# Patient Record
Sex: Male | Born: 1935
Health system: Southern US, Community
[De-identification: ages and names within clinical notes are randomized; demographics above are authoritative.]

## PROBLEM LIST (undated history)

## (undated) DIAGNOSIS — I509 Heart failure, unspecified: Secondary | ICD-10-CM

## (undated) DIAGNOSIS — I10 Essential (primary) hypertension: Secondary | ICD-10-CM

## (undated) DIAGNOSIS — J449 Chronic obstructive pulmonary disease, unspecified: Secondary | ICD-10-CM

## (undated) DIAGNOSIS — N2889 Other specified disorders of kidney and ureter: Secondary | ICD-10-CM

## (undated) DIAGNOSIS — N189 Chronic kidney disease, unspecified: Secondary | ICD-10-CM

## (undated) DIAGNOSIS — I2510 Atherosclerotic heart disease of native coronary artery without angina pectoris: Secondary | ICD-10-CM

## (undated) DIAGNOSIS — I219 Acute myocardial infarction, unspecified: Secondary | ICD-10-CM

## (undated) HISTORY — DX: Chronic obstructive pulmonary disease, unspecified: J44.9

## (undated) HISTORY — PX: CORONARY ARTERY BYPASS GRAFT: SHX141

## (undated) HISTORY — PX: HERNIA REPAIR: SHX51

## (undated) HISTORY — DX: Heart failure, unspecified: I50.9

---

## 1994-12-07 HISTORY — PX: CARDIAC CATHETERIZATION: SHX172

## 1998-09-04 ENCOUNTER — Encounter: Payer: Self-pay | Admitting: Surgery

## 1998-09-06 ENCOUNTER — Ambulatory Visit (HOSPITAL_COMMUNITY): Admission: RE | Admit: 1998-09-06 | Discharge: 1998-09-06 | Payer: Self-pay | Admitting: Surgery

## 1998-12-08 ENCOUNTER — Emergency Department (HOSPITAL_COMMUNITY): Admission: EM | Admit: 1998-12-08 | Discharge: 1998-12-08 | Payer: Self-pay | Admitting: Emergency Medicine

## 2001-08-19 ENCOUNTER — Encounter: Admission: RE | Admit: 2001-08-19 | Discharge: 2001-08-19 | Payer: Self-pay | Admitting: Nephrology

## 2001-08-19 ENCOUNTER — Encounter: Payer: Self-pay | Admitting: Nephrology

## 2003-09-29 ENCOUNTER — Emergency Department (HOSPITAL_COMMUNITY): Admission: EM | Admit: 2003-09-29 | Discharge: 2003-09-29 | Payer: Self-pay | Admitting: Emergency Medicine

## 2005-04-20 ENCOUNTER — Emergency Department (HOSPITAL_COMMUNITY): Admission: EM | Admit: 2005-04-20 | Discharge: 2005-04-20 | Payer: Self-pay | Admitting: Emergency Medicine

## 2006-12-24 ENCOUNTER — Emergency Department (HOSPITAL_COMMUNITY): Admission: EM | Admit: 2006-12-24 | Discharge: 2006-12-24 | Payer: Self-pay | Admitting: Emergency Medicine

## 2007-12-19 ENCOUNTER — Encounter: Admission: RE | Admit: 2007-12-19 | Discharge: 2008-01-09 | Payer: Self-pay | Admitting: Endocrinology

## 2008-02-27 ENCOUNTER — Emergency Department (HOSPITAL_COMMUNITY): Admission: EM | Admit: 2008-02-27 | Discharge: 2008-02-27 | Payer: Self-pay | Admitting: Emergency Medicine

## 2008-02-27 ENCOUNTER — Ambulatory Visit (HOSPITAL_COMMUNITY): Admission: RE | Admit: 2008-02-27 | Discharge: 2008-02-27 | Payer: Self-pay | Admitting: *Deleted

## 2008-02-27 ENCOUNTER — Encounter (INDEPENDENT_AMBULATORY_CARE_PROVIDER_SITE_OTHER): Payer: Self-pay | Admitting: *Deleted

## 2008-06-27 ENCOUNTER — Emergency Department (HOSPITAL_COMMUNITY): Admission: EM | Admit: 2008-06-27 | Discharge: 2008-06-27 | Payer: Self-pay | Admitting: Emergency Medicine

## 2008-06-28 ENCOUNTER — Ambulatory Visit: Payer: Self-pay | Admitting: Cardiovascular Disease

## 2008-06-28 ENCOUNTER — Inpatient Hospital Stay (HOSPITAL_COMMUNITY): Admission: EM | Admit: 2008-06-28 | Discharge: 2008-07-04 | Payer: Self-pay | Admitting: Emergency Medicine

## 2008-06-29 ENCOUNTER — Encounter (INDEPENDENT_AMBULATORY_CARE_PROVIDER_SITE_OTHER): Payer: Self-pay | Admitting: Internal Medicine

## 2009-12-07 HISTORY — PX: DOPPLER ECHOCARDIOGRAPHY: SHX263

## 2010-06-27 ENCOUNTER — Ambulatory Visit: Payer: Self-pay | Admitting: Cardiology

## 2010-06-27 ENCOUNTER — Inpatient Hospital Stay (HOSPITAL_COMMUNITY): Admission: EM | Admit: 2010-06-27 | Discharge: 2010-07-01 | Payer: Self-pay | Admitting: Emergency Medicine

## 2010-06-27 ENCOUNTER — Encounter (INDEPENDENT_AMBULATORY_CARE_PROVIDER_SITE_OTHER): Payer: Self-pay | Admitting: Internal Medicine

## 2010-09-29 ENCOUNTER — Ambulatory Visit (HOSPITAL_COMMUNITY): Admission: RE | Admit: 2010-09-29 | Discharge: 2010-09-29 | Payer: Self-pay | Admitting: Endocrinology

## 2011-02-21 LAB — HEMOGLOBIN A1C
Hgb A1c MFr Bld: 8.4 % — ABNORMAL HIGH
Mean Plasma Glucose: 194 mg/dL — ABNORMAL HIGH

## 2011-02-21 LAB — CBC
HCT: 44.8 % (ref 39.0–52.0)
HCT: 45.5 % (ref 39.0–52.0)
HCT: 51.1 % (ref 39.0–52.0)
HCT: 54.1 % — ABNORMAL HIGH (ref 39.0–52.0)
Hemoglobin: 15.2 g/dL (ref 13.0–17.0)
MCH: 28.8 pg (ref 26.0–34.0)
MCH: 29.3 pg (ref 26.0–34.0)
MCH: 29.4 pg (ref 26.0–34.0)
MCHC: 32.8 g/dL (ref 30.0–36.0)
MCHC: 33.2 g/dL (ref 30.0–36.0)
MCHC: 33.5 g/dL (ref 30.0–36.0)
MCV: 87.8 fL (ref 78.0–100.0)
MCV: 87.8 fL (ref 78.0–100.0)
MCV: 87.9 fL (ref 78.0–100.0)
MCV: 88.1 fL (ref 78.0–100.0)
Platelets: 173 10*3/uL (ref 150–400)
Platelets: 187 10*3/uL (ref 150–400)
Platelets: 208 10*3/uL (ref 150–400)
RBC: 5.16 MIL/uL (ref 4.22–5.81)
RDW: 13.9 % (ref 11.5–15.5)
RDW: 14 % (ref 11.5–15.5)
RDW: 14.5 % (ref 11.5–15.5)
WBC: 7.1 10*3/uL (ref 4.0–10.5)

## 2011-02-21 LAB — URINE MICROSCOPIC-ADD ON

## 2011-02-21 LAB — BASIC METABOLIC PANEL
BUN: 15 mg/dL (ref 6–23)
BUN: 30 mg/dL — ABNORMAL HIGH (ref 6–23)
CO2: 27 mEq/L (ref 19–32)
Chloride: 109 mEq/L (ref 96–112)
Creatinine, Ser: 1.78 mg/dL — ABNORMAL HIGH (ref 0.4–1.5)
GFR calc Af Amer: 54 mL/min — ABNORMAL LOW (ref >60)
GFR calc non Af Amer: 37 mL/min — ABNORMAL LOW (ref >60)
GFR calc non Af Amer: 52 mL/min — ABNORMAL LOW (ref >60)
Glucose, Bld: 217 mg/dL — ABNORMAL HIGH (ref 70–99)
Potassium: 3.9 mEq/L (ref 3.5–5.1)
Potassium: 4.2 mEq/L (ref 3.5–5.1)
Sodium: 142 mEq/L (ref 135–145)
Sodium: 145 mEq/L (ref 135–145)

## 2011-02-21 LAB — GLUCOSE, CAPILLARY
Glucose-Capillary: 109 mg/dL — ABNORMAL HIGH (ref 70–99)
Glucose-Capillary: 136 mg/dL — ABNORMAL HIGH (ref 70–99)
Glucose-Capillary: 145 mg/dL — ABNORMAL HIGH (ref 70–99)
Glucose-Capillary: 152 mg/dL — ABNORMAL HIGH (ref 70–99)
Glucose-Capillary: 185 mg/dL — ABNORMAL HIGH (ref 70–99)
Glucose-Capillary: 195 mg/dL — ABNORMAL HIGH (ref 70–99)
Glucose-Capillary: 229 mg/dL — ABNORMAL HIGH (ref 70–99)
Glucose-Capillary: 252 mg/dL — ABNORMAL HIGH (ref 70–99)
Glucose-Capillary: 92 mg/dL (ref 70–99)

## 2011-02-21 LAB — COMPREHENSIVE METABOLIC PANEL WITH GFR
ALT: 11 U/L (ref 0–53)
AST: 20 U/L (ref 0–37)
Albumin: 3.7 g/dL (ref 3.5–5.2)
Alkaline Phosphatase: 73 U/L (ref 39–117)
BUN: 25 mg/dL — ABNORMAL HIGH (ref 6–23)
CO2: 28 meq/L (ref 19–32)
Calcium: 9 mg/dL (ref 8.4–10.5)
Chloride: 102 meq/L (ref 96–112)
Creatinine, Ser: 1.94 mg/dL — ABNORMAL HIGH (ref 0.4–1.5)
GFR calc non Af Amer: 34 mL/min — ABNORMAL LOW
Glucose, Bld: 191 mg/dL — ABNORMAL HIGH (ref 70–99)
Potassium: 4.3 meq/L (ref 3.5–5.1)
Sodium: 137 meq/L (ref 135–145)
Total Bilirubin: 1.2 mg/dL (ref 0.3–1.2)
Total Protein: 7.2 g/dL (ref 6.0–8.3)

## 2011-02-21 LAB — TSH: TSH: 3.807 u[IU]/mL (ref 0.350–4.500)

## 2011-02-21 LAB — DIFFERENTIAL
Basophils Absolute: 0 10*3/uL (ref 0.0–0.1)
Basophils Relative: 0 % (ref 0–1)
Eosinophils Absolute: 0.1 10*3/uL (ref 0.0–0.7)
Eosinophils Relative: 1 % (ref 0–5)
Lymphocytes Relative: 7 % — ABNORMAL LOW (ref 12–46)
Lymphs Abs: 0.9 10*3/uL (ref 0.7–4.0)
Monocytes Absolute: 0.8 10*3/uL (ref 0.1–1.0)
Monocytes Relative: 6 % (ref 3–12)
Neutro Abs: 12.3 10*3/uL — ABNORMAL HIGH (ref 1.7–7.7)
Neutrophils Relative %: 87 % — ABNORMAL HIGH (ref 43–77)

## 2011-02-21 LAB — COMPREHENSIVE METABOLIC PANEL
Albumin: 3.5 g/dL (ref 3.5–5.2)
Alkaline Phosphatase: 69 U/L (ref 39–117)
BUN: 30 mg/dL — ABNORMAL HIGH (ref 6–23)
Calcium: 8.9 mg/dL (ref 8.4–10.5)
Creatinine, Ser: 2.11 mg/dL — ABNORMAL HIGH (ref 0.4–1.5)
Glucose, Bld: 249 mg/dL — ABNORMAL HIGH (ref 70–99)
Total Protein: 6.9 g/dL (ref 6.0–8.3)

## 2011-02-21 LAB — PROTIME-INR
INR: 1.1 (ref 0.00–1.49)
Prothrombin Time: 14.1 s (ref 11.6–15.2)

## 2011-02-21 LAB — URINE CULTURE: Culture: NO GROWTH

## 2011-02-21 LAB — POCT CARDIAC MARKERS
CKMB, poc: 6.9 ng/mL (ref 1.0–8.0)
Myoglobin, poc: 281 ng/mL (ref 12–200)
Troponin i, poc: <0.05 ng/mL (ref 0.00–0.09)

## 2011-02-21 LAB — CK TOTAL AND CKMB (NOT AT ARMC): Relative Index: 3.6 — ABNORMAL HIGH (ref 0.0–2.5)

## 2011-02-21 LAB — URINALYSIS, ROUTINE W REFLEX MICROSCOPIC
Hgb urine dipstick: NEGATIVE
Ketones, ur: 15 mg/dL — AB
Nitrite: NEGATIVE
pH: 5 (ref 5.0–8.0)

## 2011-02-21 LAB — CULTURE, BLOOD (ROUTINE X 2): Culture: NO GROWTH

## 2011-02-21 LAB — LIPID PANEL
Cholesterol: 130 mg/dL (ref 0–200)
LDL Cholesterol: 65 mg/dL (ref 0–99)
Triglycerides: 107 mg/dL (ref <150)
VLDL: 21 mg/dL (ref 0–40)

## 2011-02-21 LAB — LIPASE, BLOOD: Lipase: 19 U/L (ref 11–59)

## 2011-04-21 NOTE — Op Note (Signed)
NAMECLARION, TRASK NO.:  [id number]   MEDICAL RECORD NO.:  [id number]          PATIENT TYPE:  AMB   LOCATION:  ENDO                         FACILITY:  [facility]   PHYSICIAN:  Waverly Ferrari, M.D.    DATE OF BIRTH:  [birth year]   DATE OF PROCEDURE:  02/27/2008  DATE OF DISCHARGE:                               OPERATIVE REPORT   PROCEDURE:  Colonoscopy.   INDICATIONS:  Colon polyp, cancer screening   ANESTHESIA:  Fentanyl 125 mcg, Versed 10 mg and epinephrine 3 mL.   PROCEDURE:  With the patient mildly sedated in the left lateral  decubitus position, rectal exam was attempted.  I could not feel the  prostate.  Subsequently the Pentax videoscopic colonoscope was inserted  in the rectum, passed under direct vision with pressure applied.  The  patient rolled to his back and to his right side; subsequently back to  his left side.  With pressure applied, we were able to reach the cecum  identified by ileocecal valve and base of cecum.  In the base of the  cecum was a polyp seen photographed and removed using snare cautery  technique setting of 20/150 blended current.  There was some bleeding  from this polyp so I elected to inject epinephrine 2 mL into this area.  From this point the colonoscope was then slowly withdrawn taking  circumferential views of colonic mucosa stopping in the ascending colon  where a second polyp was seen and it was removed also using snare  cautery technique with the same setting and this tissue was suctioned  into the endoscope as well and retrieved for pathology placed in size  specimen bottle number one.  We then withdrew taking circumferential  views remaining colonic mucosa stopping then only in the transverse  colon where two polyps were seen. The first was removed using snare  cautery technique.  The second with hot biopsy forceps technique.  With  the former there was some bleeding again and 1 mL of epinephrine was  injected into this  polyp site with good hemostasis.  The endoscope was  withdrawn all the way to the rectum which appeared normal on direct and  showed hemorrhoids on retroflexed view.  The endoscope was straightened  and withdrawn.  The patient's vital signs, pulse oximeter remained  stable.  The patient tolerated procedure well without apparent  complications.   FINDINGS:  Diverticulosis of sigmoid colon, polyps of cecum and  ascending colon and also transverse colon, placed into separate  containers.  Internal hemorrhoids were noted.   PLAN:  Await biopsy report.  The patient will call me for results and  follow-up with me as an outpatient.           ______________________________  Waverly Ferrari, M.D.     GMO/MEDQ  D:  02/27/2008  T:  02/27/2008  Job:  [id number]

## 2011-04-21 NOTE — H&P (Signed)
NAMEJOURDYN, [name]                 ACCOUNT NO.:  [id number]   MEDICAL RECORD NO.:  [id number]          PATIENT TYPE:  INP   LOCATION:  [id number]                         FACILITY:  [facility]   PHYSICIAN:  Durwin Nora, MDDATE OF BIRTH:  11-22-1932   DATE OF ADMISSION:  06/28/2008  DATE OF DISCHARGE:                              HISTORY & PHYSICAL   PRIMARY CARE PHYSICIAN:  Ronaldo Miyamoto, M.D.   She has no assigned health care power of attorney.  He is a full code.   PRESENTING COMPLAINT:  Fever, 2 days.   HISTORY OF PRESENTING COMPLAINTS:  This is a 77 year old male who  earlier presented to the emergency room on June 27, 2008, with a high-  grade fever, temperature 102.  The patient was sent home on Tamiflu.  However, the blood culture taken in the emergency room showed it was  positive for Gram-negative rods and the patient was recalled.  The  patient has repeat temperature today of 104, and the patient has been  having dry cough and poor oral intake.  He also has right upper  abdominal pain.  He denies any vomiting with malaise.  No nausea.  No  hematemesis or melena stool.  The patient denies any wheezing or  shortness of breath.  He denies any dysuria, hematuria, or frequency.  He denies any body swelling or skin rash.  The lab test at the emergency  room showed increased renal insufficiency, elevated LFTs, and  leukocytosis.  Chest x-ray show some questionable congestion versus  pneumonia basally.   PAST SURGICAL HISTORY:  1. Coronary artery bypass, 10 years ago.  2. Left inguinal herniorrhaphy.   SOCIAL HISTORY:  Married, had 7 children.  He quit smoking cigarettes  and drinking alcohol excessively more than 10 years ago.   FAMILY HISTORY:  Mother had diabetes, hypertension, and  hypercholesterolemia.   REVIEW OF SYSTEMS:  The 14-systems were reviewed.  Pertinent positives  are in the history of presenting complaint.   ALLERGIES:  No known drug allergies.   MEDICATIONS AT HOME:  1. Actos 30 mg daily.  2. Insulin NPH.  3. Lasix 40 mg daily.  4. Lotrel 5/40 one tablet daily.  5. Tricor 45 mg daily.  6. Clonidine 0.2 mg daily.  7. Nadolol 40 mg daily.  8. Crestor 20 mg daily.  9. Tamiflu 75 mg b.i.d.   PHYSICAL EXAMINATION:  GENERAL:  On examination, this is an elderly man.  He is tachypneic.  VITAL SIGNS:  Temperature is 104, pulse 82, respiratory rate is 32, and  blood pressure 150/67.  HEENT:  Pupils are equal, reactive to light, and accommodation.  Mucous  membranes are dry.  Head is atraumatic and normocephalic.  Oropharynx  and nasopharynx are clear.  There is no submandibular lymphadenopathy.  No axillary lymphadenopathy.  JVD is not elevated.  HEART:  Heart sounds 1 and 2 regular with bibasilar fine rales.  No  rhonchi.  ABDOMEN:  He has mild epigastric and right upper quadrant tenderness.  No organomegaly.  He has truncal obesity.  There is an old CABG  sternal  chest wall scar.  Inguinal orifices are patent.  NEUROLOGIC:  He is alert and oriented in time, place, and person.  Cranial nerves II-XII are intact.  Power is 4+ globally.  He has trace  pedal edema.  Peripheral pulses are present.   LABORATORY DATA:  WBCs 13.3, hematocrit 44, and platelet count 163.  INR  is 1.2.  Chemistry shows a sodium of 140, potassium 4.5, chloride 110,  bicarbonate 22, glucose 132, BUN 24, creatinine 2.7, and ALT is 184.  Urinalysis shows small leukocyte esterase.  WBC 326.  Chest x-ray shows  some questionable congestive heart failure, question of pericardial  effusion, bibasilar atelectasis versus infiltrates.  EKG is pending.   ASSESSMENT:  Gram-negative sepsis, rule out underlying flu.  Urinary  tract infection.  Questionable pneumonia versus CHF.  Elevated  transaminases.  Acute renal failure versus acute-on-chronic renal  insufficiency.  Diabetes is fairly controlled.  Moderate hypertension.   The patient will be admitted to a Telemetry  bed.  He is to be on bed  rest with proper precautions.  Activity will be bed rest.  Oxygen 2-5 L  per minute to keep saturations greater than 90.  Diet will be renal and  low cholesterol.  IV fluids, normal saline at 50 mL an hour, watching  for fluid overload.  Cardiac enzymes q.8 h. x3 and EKG q.8 h. x3.  We  will get a nasal swab H1N1 x2, renal ultrasound, 2-D echocardiogram, and  ABG on room air.  Obtain a blood culture and urine culture.  He is to be  on heparin 5000 units subcu q.8 h. for DVT prophylaxis, Phenergan 12.5  mg IV alternating with Zofran 2 mg IV p.r.n. for nausea.  Protonix 40 mg  IV daily.  DuoNeb one unit dose q.6 h. p.r.n.  Accu-Chek a.c. and  bedtime. NovoLog sliding scale insulin coverage via renal scale, and  Tylenol 650 mg q.4 h. p.r.n.  Start him on IV Rocephin 1 g daily and  azithromycin 500 mg IV daily.  Continue with home medication except for  his TriCor and Crestor because of the elevated LFT.  We will hold Lasix.  Continue the Tamiflu 75 mg b.i.d.  Check a CBC, CMP, and chest x-ray in  the morning.  Consider an ID evaluation.      Durwin Nora, MD  Electronically Signed     MIO/MEDQ  D:  06/28/2008  T:  06/29/2008  Job:  [id number]   cc:   Ronaldo Miyamoto, M.D.

## 2011-04-21 NOTE — Discharge Summary (Signed)
NAME:  MAE, BEAUCHAMP NO.:  [id number]   MEDICAL RECORD NO.:  [id number]          PATIENT TYPE:  INP   LOCATION:  [id number]                         FACILITY:  [facility]   PHYSICIAN:  Rise Patience, MDDATE OF BIRTH:  1932-05-06   DATE OF ADMISSION:  06/28/2008  DATE OF DISCHARGE:  07/04/2008                               DISCHARGE SUMMARY   HOSPITAL COURSE:  A 77 year old male with known history of CAD, status  post CABG, chronic kidney disease, hypertension, and diabetes mellitus  type 2 was called into the hospital after the patient had blood cultures  done prior to admission, which grew Gram-negative rods.  The patient had  originally presented to ER with fever when he was discharged home with  Tamiflu.  Blood cultures obtained at that time showed Gram-negative  rods.  The patient was called back and admitted for further management  of his Gram-negative bacteremia.  In addition, the patient also was  found to be febrile.  The patient was started on empiric antibiotics.  Eventually, blood cultures showed E. Coli, which was sensitive to  ceftriaxone.  The patient also was found to have a high creatinine level  of 2.7.  The patient's ACE inhibitors were stopped, hydration was given,  during which time, the patient also had a CAT scan of the abdomen and  pelvis, which did not show any acute findings.  The patient's condition  gradually improved.  The patient also had a H1N1 swab done, results of  which still are pending.  The patient also had hypoglycemic event  wherein his NPH dose had to be adjusted, presently his blood sugars are  ranging around 100.  The patient is more alert, awake, and oriented and  wants to go home.  The patient had a repeat blood culture, which did not  show any growth.  At this time, as the patient is eager to go home, he  will be discharged home.  I advised the patient that any pending  cultures to be followed with his primary care physician  Dr. Wilson Singer.  Repeat a CMET within a week's time.   FINAL DIAGNOSES:  1. Escherichia coli bacteremia.  2. Acute-on-chronic kidney disease.  3. Diabetes mellitus type 2.  4. Abnormal liver function tests and thrombocytopenia, just resolved,      probably from sepsis.  5. Hypertension.  6. History of coronary artery disease.   DISCHARGE MEDICATIONS:  1. Actos 30 mg p.o. daily.  2. TriCor 45 mg p.o. daily.  3. Clonidine 0.1 mg p.o. b.i.d.  4. Nadolol 40 mg p.o. daily.  5. Crestor 20 mg p.o. daily.  6. Omnicef 300 mg p.o. b.i.d. for 10 days.  7. Tamiflu 75 mg p.o. b.i.d. for 5 days.  8. Norvasc 5 mg daily.  9. NPH insulin 20 units subcutaneous in the a.m. and 10 units      subcutaneous at p.m.  10.Aspirin 81 mg p.o. daily.   PLAN:  The patient is advised to follow with his primary care physician  within a week's time who will recheck his complete metabolic panel.  The  patient's ACE inhibitors and Lasix are on hold.  Once the patient  follows with his primary care physician, at that time, to consider  restarting if appropriate.  The patient advised to be at home until he  completes a course of Tamiflu.  The patient is advised to complete his  full dose of antibiotics.  The patient is to be on hypoglycemic  precautions and to check his blood sugars a.c. and nightly to be on  cardiac-healthy and carb-modified diet and to be on fall precautions.      Rise Patience, MD  Electronically Signed     ANK/MEDQ  D:  07/04/2008  T:  07/05/2008  Job:  [id number]

## 2011-09-04 LAB — CBC
HCT: 38.8 — ABNORMAL LOW
HCT: 44.6
Hemoglobin: 12.3 — ABNORMAL LOW
Hemoglobin: 13.2
Hemoglobin: 14.8
MCHC: 32.9
MCHC: 33.1
MCHC: 33.3
MCV: 85.2
MCV: 85.8
MCV: 86.1
RBC: 4.34
RBC: 4.52
RBC: 4.64
RBC: 5.21
RBC: 5.23
WBC: 11.5 — ABNORMAL HIGH
WBC: 11.5 — ABNORMAL HIGH
WBC: 13.3 — ABNORMAL HIGH
WBC: 7.5
WBC: 8.5

## 2011-09-04 LAB — COMPREHENSIVE METABOLIC PANEL
ALT: 43
ALT: 80 — ABNORMAL HIGH
AST: 34
AST: 91 — ABNORMAL HIGH
Albumin: 3.7
Alkaline Phosphatase: 111
BUN: 18
BUN: 24 — ABNORMAL HIGH
BUN: 25 — ABNORMAL HIGH
CO2: 22
CO2: 22
CO2: 22
CO2: 25
Calcium: 8.3 — ABNORMAL LOW
Calcium: 8.5
Calcium: 8.5
Calcium: 8.9
Chloride: 107
Chloride: 108
Chloride: 108
Chloride: 110
Creatinine, Ser: 1.86 — ABNORMAL HIGH
Creatinine, Ser: 1.9 — ABNORMAL HIGH
Creatinine, Ser: 2.56 — ABNORMAL HIGH
Creatinine, Ser: 2.7 — ABNORMAL HIGH
GFR calc Af Amer: 30 — ABNORMAL LOW
GFR calc Af Amer: 42 — ABNORMAL LOW
GFR calc Af Amer: 43 — ABNORMAL LOW
GFR calc Af Amer: 49 — ABNORMAL LOW
GFR calc non Af Amer: 23 — ABNORMAL LOW
GFR calc non Af Amer: 25 — ABNORMAL LOW
GFR calc non Af Amer: 35 — ABNORMAL LOW
GFR calc non Af Amer: 41 — ABNORMAL LOW
Glucose, Bld: 148 — ABNORMAL HIGH
Glucose, Bld: 169 — ABNORMAL HIGH
Potassium: 3.5
Potassium: 3.9
Sodium: 140
Sodium: 141
Total Bilirubin: 1.1
Total Bilirubin: 1.5 — ABNORMAL HIGH
Total Bilirubin: 1.7 — ABNORMAL HIGH
Total Bilirubin: 2.8 — ABNORMAL HIGH

## 2011-09-04 LAB — CARDIAC PANEL(CRET KIN+CKTOT+MB+TROPI)
CK, MB: 8.4 — ABNORMAL HIGH
Relative Index: 0.4
Total CK: 963 — ABNORMAL HIGH
Troponin I: 0.03

## 2011-09-04 LAB — URINALYSIS, ROUTINE W REFLEX MICROSCOPIC
Glucose, UA: NEGATIVE
Glucose, UA: NEGATIVE
Hgb urine dipstick: NEGATIVE
Ketones, ur: 15 — AB
Protein, ur: 100 — AB
Specific Gravity, Urine: 1.02
Urobilinogen, UA: 1
pH: 5

## 2011-09-04 LAB — BASIC METABOLIC PANEL
BUN: 17
CO2: 23
Calcium: 8.6
Creatinine, Ser: 1.68 — ABNORMAL HIGH
Creatinine, Ser: 1.69 — ABNORMAL HIGH
GFR calc Af Amer: 48 — ABNORMAL LOW
GFR calc Af Amer: 49 — ABNORMAL LOW
GFR calc non Af Amer: 40 — ABNORMAL LOW
GFR calc non Af Amer: 40 — ABNORMAL LOW
Potassium: 4.2
Sodium: 141

## 2011-09-04 LAB — CULTURE, BLOOD (ROUTINE X 2): Culture: NO GROWTH

## 2011-09-04 LAB — DIFFERENTIAL
Basophils Absolute: 0
Basophils Absolute: 0
Lymphocytes Relative: 4 — ABNORMAL LOW
Lymphocytes Relative: 5 — ABNORMAL LOW
Lymphs Abs: 0.5 — ABNORMAL LOW
Monocytes Absolute: 0.4
Neutro Abs: 10.5 — ABNORMAL HIGH
Neutro Abs: 12.3 — ABNORMAL HIGH

## 2011-09-04 LAB — URINE MICROSCOPIC-ADD ON

## 2011-09-04 LAB — BLOOD GAS, ARTERIAL
Acid-base deficit: 2
O2 Content: 2
O2 Saturation: 90
Patient temperature: 98.6

## 2011-09-04 LAB — URINE CULTURE

## 2011-09-04 LAB — HEMOGLOBIN A1C
Hgb A1c MFr Bld: 7 — ABNORMAL HIGH
Hgb A1c MFr Bld: 7.5 — ABNORMAL HIGH
Mean Plasma Glucose: 172

## 2011-09-04 LAB — H1N1 SCREEN (PCR)

## 2011-09-04 LAB — POCT CARDIAC MARKERS: Operator id: 288831

## 2011-09-04 LAB — LIPASE, BLOOD: Lipase: 19

## 2011-09-04 LAB — B-NATRIURETIC PEPTIDE (CONVERTED LAB): Pro B Natriuretic peptide (BNP): 945 — ABNORMAL HIGH

## 2011-12-31 DIAGNOSIS — E789 Disorder of lipoprotein metabolism, unspecified: Secondary | ICD-10-CM | POA: Diagnosis not present

## 2012-01-07 DIAGNOSIS — I10 Essential (primary) hypertension: Secondary | ICD-10-CM | POA: Diagnosis not present

## 2012-01-07 DIAGNOSIS — E291 Testicular hypofunction: Secondary | ICD-10-CM | POA: Diagnosis not present

## 2012-01-07 DIAGNOSIS — E789 Disorder of lipoprotein metabolism, unspecified: Secondary | ICD-10-CM | POA: Diagnosis not present

## 2012-01-14 ENCOUNTER — Inpatient Hospital Stay (HOSPITAL_COMMUNITY)
Admission: EM | Admit: 2012-01-14 | Discharge: 2012-01-16 | DRG: 639 | Disposition: A | Discharge status: Home / Self Care | Payer: Medicare Other | Source: Ambulatory Visit | Attending: Internal Medicine | Admitting: Internal Medicine

## 2012-01-14 ENCOUNTER — Other Ambulatory Visit: Payer: Self-pay

## 2012-01-14 ENCOUNTER — Emergency Department (HOSPITAL_COMMUNITY): Payer: Medicare Other

## 2012-01-14 ENCOUNTER — Encounter (HOSPITAL_COMMUNITY): Payer: Self-pay | Admitting: Internal Medicine

## 2012-01-14 DIAGNOSIS — I2510 Atherosclerotic heart disease of native coronary artery without angina pectoris: Secondary | ICD-10-CM | POA: Diagnosis present

## 2012-01-14 DIAGNOSIS — E785 Hyperlipidemia, unspecified: Secondary | ICD-10-CM | POA: Diagnosis present

## 2012-01-14 DIAGNOSIS — E162 Hypoglycemia, unspecified: Secondary | ICD-10-CM | POA: Diagnosis not present

## 2012-01-14 DIAGNOSIS — R55 Syncope and collapse: Secondary | ICD-10-CM | POA: Diagnosis not present

## 2012-01-14 DIAGNOSIS — N189 Chronic kidney disease, unspecified: Secondary | ICD-10-CM | POA: Diagnosis not present

## 2012-01-14 DIAGNOSIS — N183 Chronic kidney disease, stage 3 unspecified: Secondary | ICD-10-CM | POA: Diagnosis present

## 2012-01-14 DIAGNOSIS — Z79899 Other long term (current) drug therapy: Secondary | ICD-10-CM

## 2012-01-14 DIAGNOSIS — E1169 Type 2 diabetes mellitus with other specified complication: Principal | ICD-10-CM | POA: Diagnosis present

## 2012-01-14 DIAGNOSIS — I10 Essential (primary) hypertension: Secondary | ICD-10-CM | POA: Diagnosis present

## 2012-01-14 DIAGNOSIS — Z043 Encounter for examination and observation following other accident: Secondary | ICD-10-CM | POA: Diagnosis not present

## 2012-01-14 DIAGNOSIS — Z87891 Personal history of nicotine dependence: Secondary | ICD-10-CM

## 2012-01-14 DIAGNOSIS — I517 Cardiomegaly: Secondary | ICD-10-CM | POA: Diagnosis not present

## 2012-01-14 DIAGNOSIS — R7301 Impaired fasting glucose: Secondary | ICD-10-CM | POA: Diagnosis not present

## 2012-01-14 DIAGNOSIS — E161 Other hypoglycemia: Secondary | ICD-10-CM | POA: Diagnosis not present

## 2012-01-14 DIAGNOSIS — I129 Hypertensive chronic kidney disease with stage 1 through stage 4 chronic kidney disease, or unspecified chronic kidney disease: Secondary | ICD-10-CM | POA: Diagnosis present

## 2012-01-14 DIAGNOSIS — Z951 Presence of aortocoronary bypass graft: Secondary | ICD-10-CM

## 2012-01-14 DIAGNOSIS — D72829 Elevated white blood cell count, unspecified: Secondary | ICD-10-CM | POA: Diagnosis present

## 2012-01-14 DIAGNOSIS — Z794 Long term (current) use of insulin: Secondary | ICD-10-CM

## 2012-01-14 HISTORY — DX: Essential (primary) hypertension: I10

## 2012-01-14 HISTORY — DX: Chronic kidney disease, unspecified: N18.9

## 2012-01-14 HISTORY — DX: Atherosclerotic heart disease of native coronary artery without angina pectoris: I25.10

## 2012-01-14 LAB — GLUCOSE, CAPILLARY
Glucose-Capillary: 48 mg/dL — ABNORMAL LOW (ref 70–99)
Glucose-Capillary: 262 mg/dL — ABNORMAL HIGH (ref 70–99)

## 2012-01-14 LAB — COMPREHENSIVE METABOLIC PANEL
ALT: 10 U/L (ref 0–53)
AST: 24 U/L (ref 0–37)
Alkaline Phosphatase: 104 U/L (ref 39–117)
CO2: 26 mEq/L (ref 19–32)
Calcium: 9.5 mg/dL (ref 8.4–10.5)
Chloride: 103 mEq/L (ref 96–112)
GFR calc Af Amer: 43 mL/min — ABNORMAL LOW (ref >90)
GFR calc non Af Amer: 37 mL/min — ABNORMAL LOW (ref >90)
Glucose, Bld: 70 mg/dL (ref 70–99)
Sodium: 139 mEq/L (ref 135–145)
Total Bilirubin: 0.5 mg/dL (ref 0.3–1.2)

## 2012-01-14 LAB — CARDIAC PANEL(CRET KIN+CKTOT+MB+TROPI): Troponin I: <0.3 ng/mL (ref <0.30)

## 2012-01-14 LAB — POCT I-STAT, CHEM 8
BUN: 21 mg/dL (ref 6–23)
Chloride: 108 mEq/L (ref 96–112)
Creatinine, Ser: 1.8 mg/dL — ABNORMAL HIGH (ref 0.50–1.35)
Glucose, Bld: 71 mg/dL (ref 70–99)
Hemoglobin: 17.3 g/dL — ABNORMAL HIGH (ref 13.0–17.0)
Potassium: 3.6 mEq/L (ref 3.5–5.1)
Sodium: 143 mEq/L (ref 135–145)

## 2012-01-14 LAB — URINALYSIS, ROUTINE W REFLEX MICROSCOPIC
Bilirubin Urine: NEGATIVE
Glucose, UA: NEGATIVE mg/dL
Specific Gravity, Urine: 1.008 (ref 1.005–1.030)
Urobilinogen, UA: 0.2 mg/dL (ref 0.0–1.0)
pH: 7 (ref 5.0–8.0)

## 2012-01-14 LAB — DIFFERENTIAL
Basophils Absolute: 0 10*3/uL (ref 0.0–0.1)
Eosinophils Relative: 1 % (ref 0–5)
Lymphocytes Relative: 7 % — ABNORMAL LOW (ref 12–46)
Lymphs Abs: 1.3 10*3/uL (ref 0.7–4.0)
Neutro Abs: 16.1 10*3/uL — ABNORMAL HIGH (ref 1.7–7.7)

## 2012-01-14 LAB — POCT I-STAT TROPONIN I

## 2012-01-14 LAB — CBC
HCT: 47.2 % (ref 39.0–52.0)
MCV: 80 fL (ref 78.0–100.0)
Platelets: 204 10*3/uL (ref 150–400)
RBC: 5.9 MIL/uL — ABNORMAL HIGH (ref 4.22–5.81)
WBC: 19 10*3/uL — ABNORMAL HIGH (ref 4.0–10.5)

## 2012-01-14 LAB — URINE MICROSCOPIC-ADD ON

## 2012-01-14 MED ORDER — SODIUM CHLORIDE 0.9 % IV SOLN: INTRAVENOUS | Status: DC

## 2012-01-14 MED ORDER — BENAZEPRIL HCL 40 MG PO TABS
40.0000 mg | ORAL_TABLET | Freq: Every day | ORAL | Status: DC
  Administered 2012-01-15 – 2012-01-16 (×2): 40 mg via ORAL
  Filled 2012-01-14 (×2): qty 1

## 2012-01-14 MED ORDER — AMLODIPINE BESY-BENAZEPRIL HCL 10-40 MG PO CAPS: 1.0000 | ORAL_CAPSULE | Freq: Every day | ORAL | Status: DC

## 2012-01-14 MED ORDER — FENOFIBRATE 54 MG PO TABS
54.0000 mg | ORAL_TABLET | Freq: Every day | ORAL | Status: DC
  Administered 2012-01-15 – 2012-01-16 (×2): 54 mg via ORAL
  Filled 2012-01-14 (×2): qty 1

## 2012-01-14 MED ORDER — FUROSEMIDE 80 MG PO TABS
80.0000 mg | ORAL_TABLET | Freq: Every day | ORAL | Status: DC
  Administered 2012-01-15 – 2012-01-16 (×2): 80 mg via ORAL
  Filled 2012-01-14 (×2): qty 1

## 2012-01-14 MED ORDER — ACETAMINOPHEN 650 MG RE SUPP: 650.0000 mg | Freq: Four times a day (QID) | RECTAL | Status: DC | PRN

## 2012-01-14 MED ORDER — AMLODIPINE BESYLATE 10 MG PO TABS
10.0000 mg | ORAL_TABLET | Freq: Every day | ORAL | Status: DC
  Administered 2012-01-15 – 2012-01-16 (×2): 10 mg via ORAL
  Filled 2012-01-14 (×2): qty 1

## 2012-01-14 MED ORDER — ACETAMINOPHEN 325 MG PO TABS: 650.0000 mg | ORAL_TABLET | Freq: Four times a day (QID) | ORAL | Status: DC | PRN

## 2012-01-14 MED ORDER — DEXTROSE 50 % IV SOLN
INTRAVENOUS | Status: AC
  Administered 2012-01-14: 18:00:00
  Filled 2012-01-14: qty 50

## 2012-01-14 MED ORDER — EZETIMIBE 10 MG PO TABS
10.0000 mg | ORAL_TABLET | Freq: Every day | ORAL | Status: DC
  Administered 2012-01-15 – 2012-01-16 (×2): 10 mg via ORAL
  Filled 2012-01-14 (×2): qty 1

## 2012-01-14 MED ORDER — ROSUVASTATIN CALCIUM 20 MG PO TABS
20.0000 mg | ORAL_TABLET | Freq: Every day | ORAL | Status: DC
  Administered 2012-01-15 – 2012-01-16 (×2): 20 mg via ORAL
  Filled 2012-01-14 (×2): qty 1

## 2012-01-14 MED ORDER — CLONIDINE HCL 0.1 MG PO TABS
0.1000 mg | ORAL_TABLET | Freq: Two times a day (BID) | ORAL | Status: DC
  Administered 2012-01-14 – 2012-01-16 (×4): 0.1 mg via ORAL
  Filled 2012-01-14 (×5): qty 1

## 2012-01-14 MED ORDER — SODIUM CHLORIDE 0.9 % IJ SOLN
3.0000 mL | Freq: Two times a day (BID) | INTRAMUSCULAR | Status: DC
  Administered 2012-01-14 – 2012-01-16 (×3): 3 mL via INTRAVENOUS

## 2012-01-14 MED ORDER — GLUCOSE 40 % PO GEL
ORAL | Status: AC
  Administered 2012-01-14: 18:00:00
  Filled 2012-01-14: qty 1

## 2012-01-14 MED ORDER — NADOLOL 40 MG PO TABS
40.0000 mg | ORAL_TABLET | Freq: Every day | ORAL | Status: DC
  Administered 2012-01-16: 40 mg via ORAL
  Filled 2012-01-14 (×2): qty 1

## 2012-01-14 MED ORDER — ONDANSETRON HCL 4 MG PO TABS: 4.0000 mg | ORAL_TABLET | Freq: Four times a day (QID) | ORAL | Status: DC | PRN

## 2012-01-14 MED ORDER — ONDANSETRON HCL 4 MG/2ML IJ SOLN: 4.0000 mg | Freq: Four times a day (QID) | INTRAMUSCULAR | Status: DC | PRN

## 2012-01-14 MED ORDER — SODIUM CHLORIDE 0.9 % IV BOLUS (SEPSIS)
1000.0000 mL | Freq: Once | INTRAVENOUS | Status: AC
  Administered 2012-01-14: 1000 mL via INTRAVENOUS
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
## 2012-01-14 NOTE — ED Notes (Signed)
Pt returned from CT/ xray at this time.  VS stable.  BP: 161/76; RR 16; HR 57; Temp 97.6 oral; oxygen sat 96% room air.  Pt continue to deny pain.  Resps even/unlabored.  Skin warm/ dry and color WNL.  Alert/ oriented x 4.

## 2012-01-14 NOTE — ED Notes (Signed)
BP 150/78; RR 15; HR 56; pulse ox 96% room air

## 2012-01-14 NOTE — ED Notes (Signed)
BP 159/80; ox sat 100% 2l/[place]; RR 16; HR 55.  Family remains at bedside.  Pt states he feels his breathing has improved since application of oxygen and administration of D50.  Alert/ oriented x 4.  Skin warm/ dry and color WNL for race.
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
## 2012-01-14 NOTE — ED Notes (Signed)
Pt given turkey sandwich and orange juice

## 2012-01-14 NOTE — ED Notes (Signed)
The pt is  Sitting with his family no complaints.  Waiting for admission orders

## 2012-01-14 NOTE — ED Notes (Signed)
Liza, NT notified me that pt reports difficulty breathing.  Upon checking with pt, he states "I just can't get it right" when asked if he was having trouble breathing.  Pt taking regular breaths with periods of swallowing while inhaling or exhaling.  Dr. Dillard Essex notified and at bedside.  Oxygen 2L/min via [place] applied and CBG checked: 48 mg/dl.  Verbal order received for amp of D50.

## 2012-01-14 NOTE — ED Notes (Signed)
The pt is eating a sandwich.  He is alert and is sayint that he has not eaten all day since breakfast.  Family at the bedside
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
## 2012-01-14 NOTE — ED Notes (Signed)
Admitting doctor here to see 

## 2012-01-14 NOTE — ED Notes (Signed)
BP 157/77; HR 55; pulse ox 96% room air; RR 15; denies pain.

## 2012-01-14 NOTE — ED Notes (Signed)
Pt transported to x-ray/ CT.  Vital signs stable.  BP 148/77, RR 14, HR 57, Pulse ox 95% room air.

## 2012-01-15 DIAGNOSIS — Z87891 Personal history of nicotine dependence: Secondary | ICD-10-CM | POA: Diagnosis not present

## 2012-01-15 DIAGNOSIS — N189 Chronic kidney disease, unspecified: Secondary | ICD-10-CM | POA: Diagnosis present

## 2012-01-15 DIAGNOSIS — R55 Syncope and collapse: Secondary | ICD-10-CM | POA: Diagnosis present

## 2012-01-15 DIAGNOSIS — Z794 Long term (current) use of insulin: Secondary | ICD-10-CM | POA: Diagnosis not present

## 2012-01-15 DIAGNOSIS — D72829 Elevated white blood cell count, unspecified: Secondary | ICD-10-CM | POA: Diagnosis present

## 2012-01-15 DIAGNOSIS — Z79899 Other long term (current) drug therapy: Secondary | ICD-10-CM | POA: Diagnosis not present

## 2012-01-15 DIAGNOSIS — E1169 Type 2 diabetes mellitus with other specified complication: Secondary | ICD-10-CM | POA: Diagnosis present

## 2012-01-15 DIAGNOSIS — I2510 Atherosclerotic heart disease of native coronary artery without angina pectoris: Secondary | ICD-10-CM | POA: Diagnosis present

## 2012-01-15 DIAGNOSIS — E785 Hyperlipidemia, unspecified: Secondary | ICD-10-CM | POA: Diagnosis present

## 2012-01-15 DIAGNOSIS — Z951 Presence of aortocoronary bypass graft: Secondary | ICD-10-CM | POA: Diagnosis not present

## 2012-01-15 DIAGNOSIS — I129 Hypertensive chronic kidney disease with stage 1 through stage 4 chronic kidney disease, or unspecified chronic kidney disease: Secondary | ICD-10-CM | POA: Diagnosis present

## 2012-01-15 LAB — CARDIAC PANEL(CRET KIN+CKTOT+MB+TROPI)
CK, MB: 6.2 ng/mL (ref 0.3–4.0)
CK, MB: 4.9 ng/mL — ABNORMAL HIGH (ref 0.3–4.0)
Relative Index: 1.5 (ref 0.0–2.5)
Relative Index: 1.1 (ref 0.0–2.5)
Troponin I: <0.3 ng/mL (ref <0.30)

## 2012-01-15 LAB — GLUCOSE, CAPILLARY
Glucose-Capillary: 312 mg/dL — ABNORMAL HIGH (ref 70–99)
Glucose-Capillary: 317 mg/dL — ABNORMAL HIGH (ref 70–99)
Glucose-Capillary: 286 mg/dL — ABNORMAL HIGH (ref 70–99)

## 2012-01-15 LAB — CBC
HCT: 42.9 % (ref 39.0–52.0)
Platelets: 180 10*3/uL (ref 150–400)
RDW: 14.3 % (ref 11.5–15.5)
WBC: 9.1 10*3/uL (ref 4.0–10.5)

## 2012-01-15 LAB — COMPREHENSIVE METABOLIC PANEL
Alkaline Phosphatase: 88 U/L (ref 39–117)
BUN: 18 mg/dL (ref 6–23)
Creatinine, Ser: 1.68 mg/dL — ABNORMAL HIGH (ref 0.50–1.35)
GFR calc Af Amer: 44 mL/min — ABNORMAL LOW (ref >90)
Glucose, Bld: 264 mg/dL — ABNORMAL HIGH (ref 70–99)
Potassium: 3.9 mEq/L (ref 3.5–5.1)
Total Bilirubin: 0.5 mg/dL (ref 0.3–1.2)
Total Protein: 6.5 g/dL (ref 6.0–8.3)

## 2012-01-15 LAB — HEMOGLOBIN A1C: Mean Plasma Glucose: 169 mg/dL — ABNORMAL HIGH (ref <117)

## 2012-01-15 MED ORDER — INSULIN ASPART 100 UNIT/ML ~~LOC~~ SOLN
0.0000 [IU] | Freq: Three times a day (TID) | SUBCUTANEOUS | Status: DC
  Administered 2012-01-15 – 2012-01-16 (×3): 5 [IU] via SUBCUTANEOUS
  Filled 2012-01-15: qty 3

## 2012-01-15 MED ORDER — INSULIN LISPRO PROT & LISPRO (50-50 MIX) 100 UNIT/ML ~~LOC~~ SUSP
80.0000 [IU] | Freq: Two times a day (BID) | SUBCUTANEOUS | Status: DC
  Administered 2012-01-16: 80 [IU] via SUBCUTANEOUS
  Filled 2012-01-15: qty 10

## 2012-01-15 NOTE — Plan of Care (Signed)
Problem: Phase I Progression Outcomes Goal: Initial discharge plan identified Outcome: Completed/Met Date Met:  01/15/12 Plan to discharge back to home with wife

## 2012-01-15 NOTE — Progress Notes (Signed)
Pts HR SB in the high 50s all this AM; pt to receive 40mg  nadolol PO, MD made aware and was told to hold today's dose

## 2012-01-15 NOTE — Progress Notes (Signed)
CBG cont. To rise now 49 TC MD no new orders at this time will wait to AM and re-evaluate pt. Status.
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
## 2012-01-15 NOTE — Progress Notes (Signed)
Inpatient Diabetes Program Recommendations  AACE/ADA: New Consensus Statement on Inpatient Glycemic Control (2009)  Target Ranges:  Prepandial:   less than 140 mg/dL      Peak postprandial:   less than 180 mg/dL (1-2 hours)      Critically ill patients:  140 - 180 mg/dL   Reason for Visit: Results for [name], [name] (MRN [id number]) as of 01/15/2012 13:41  Ref. Range 01/15/2012 01:12 01/15/2012 03:58 01/15/2012 07:41 01/15/2012 09:59 01/15/2012 12:16  Glucose-Capillary Latest Range: 70-99 mg/dL 287 (H) 312 (H) 236 (H) 317 (H) 271 (H)   Note patient admitted with low BG's.  Now CBG's greater than 250 mg/dL.  According to medication reconciliation, patient was on 50/50 insulin 80 units bid. Consider adding 1/2 of basal insulin portion of 50/50.  Inpatient Diabetes Program Recommendations Insulin - Basal: Consider adding NPH 20 units bid. HgbA1C: to determine prehospitalization glycemic control.  Note: Will follow.

## 2012-01-16 LAB — GLUCOSE, CAPILLARY
Glucose-Capillary: 258 mg/dL — ABNORMAL HIGH (ref 70–99)
Glucose-Capillary: 102 mg/dL — ABNORMAL HIGH (ref 70–99)
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
## 2012-01-26 DIAGNOSIS — E0789 Other specified disorders of thyroid: Secondary | ICD-10-CM | POA: Diagnosis not present

## 2012-01-26 DIAGNOSIS — I10 Essential (primary) hypertension: Secondary | ICD-10-CM | POA: Diagnosis not present

## 2012-01-26 DIAGNOSIS — E119 Type 2 diabetes mellitus without complications: Secondary | ICD-10-CM | POA: Diagnosis not present

## 2012-01-26 DIAGNOSIS — E789 Disorder of lipoprotein metabolism, unspecified: Secondary | ICD-10-CM | POA: Diagnosis not present

## 2012-01-26 DIAGNOSIS — Z125 Encounter for screening for malignant neoplasm of prostate: Secondary | ICD-10-CM | POA: Diagnosis not present

## 2012-02-02 DIAGNOSIS — E291 Testicular hypofunction: Secondary | ICD-10-CM | POA: Diagnosis not present

## 2012-02-02 DIAGNOSIS — E789 Disorder of lipoprotein metabolism, unspecified: Secondary | ICD-10-CM | POA: Diagnosis not present

## 2012-02-02 DIAGNOSIS — E119 Type 2 diabetes mellitus without complications: Secondary | ICD-10-CM | POA: Diagnosis not present

## 2012-02-02 DIAGNOSIS — I10 Essential (primary) hypertension: Secondary | ICD-10-CM | POA: Diagnosis not present

## 2012-02-10 DIAGNOSIS — I2510 Atherosclerotic heart disease of native coronary artery without angina pectoris: Secondary | ICD-10-CM | POA: Diagnosis not present

## 2012-02-10 DIAGNOSIS — E119 Type 2 diabetes mellitus without complications: Secondary | ICD-10-CM | POA: Diagnosis not present

## 2012-02-10 DIAGNOSIS — I10 Essential (primary) hypertension: Secondary | ICD-10-CM | POA: Diagnosis not present

## 2012-02-10 DIAGNOSIS — E789 Disorder of lipoprotein metabolism, unspecified: Secondary | ICD-10-CM | POA: Diagnosis not present

## 2012-02-16 DIAGNOSIS — Z9861 Coronary angioplasty status: Secondary | ICD-10-CM | POA: Diagnosis not present

## 2012-02-16 DIAGNOSIS — I509 Heart failure, unspecified: Secondary | ICD-10-CM | POA: Diagnosis not present

## 2012-02-16 DIAGNOSIS — I10 Essential (primary) hypertension: Secondary | ICD-10-CM | POA: Diagnosis not present

## 2012-02-16 DIAGNOSIS — I130 Hypertensive heart and chronic kidney disease with heart failure and stage 1 through stage 4 chronic kidney disease, or unspecified chronic kidney disease: Secondary | ICD-10-CM | POA: Diagnosis not present

## 2012-03-22 DIAGNOSIS — R7301 Impaired fasting glucose: Secondary | ICD-10-CM | POA: Diagnosis not present

## 2012-03-22 DIAGNOSIS — R4182 Altered mental status, unspecified: Secondary | ICD-10-CM | POA: Diagnosis not present

## 2012-04-14 DIAGNOSIS — R7301 Impaired fasting glucose: Secondary | ICD-10-CM | POA: Diagnosis not present

## 2012-04-14 DIAGNOSIS — R404 Transient alteration of awareness: Secondary | ICD-10-CM | POA: Diagnosis not present

## 2012-04-20 DIAGNOSIS — E119 Type 2 diabetes mellitus without complications: Secondary | ICD-10-CM | POA: Diagnosis not present

## 2012-04-21 DIAGNOSIS — E119 Type 2 diabetes mellitus without complications: Secondary | ICD-10-CM | POA: Diagnosis not present

## 2012-04-21 DIAGNOSIS — E789 Disorder of lipoprotein metabolism, unspecified: Secondary | ICD-10-CM | POA: Diagnosis not present

## 2012-04-21 DIAGNOSIS — E162 Hypoglycemia, unspecified: Secondary | ICD-10-CM | POA: Diagnosis not present

## 2012-04-21 DIAGNOSIS — I10 Essential (primary) hypertension: Secondary | ICD-10-CM | POA: Diagnosis not present

## 2012-06-17 DIAGNOSIS — R972 Elevated prostate specific antigen [PSA]: Secondary | ICD-10-CM | POA: Diagnosis not present

## 2012-06-27 DIAGNOSIS — E119 Type 2 diabetes mellitus without complications: Secondary | ICD-10-CM | POA: Diagnosis not present

## 2012-06-27 DIAGNOSIS — I10 Essential (primary) hypertension: Secondary | ICD-10-CM | POA: Diagnosis not present

## 2012-06-27 DIAGNOSIS — E789 Disorder of lipoprotein metabolism, unspecified: Secondary | ICD-10-CM | POA: Diagnosis not present

## 2012-06-27 DIAGNOSIS — E291 Testicular hypofunction: Secondary | ICD-10-CM | POA: Diagnosis not present

## 2012-07-08 ENCOUNTER — Encounter (HOSPITAL_COMMUNITY): Payer: Self-pay | Admitting: Emergency Medicine

## 2012-07-08 ENCOUNTER — Emergency Department (HOSPITAL_COMMUNITY)
Admission: EM | Admit: 2012-07-08 | Discharge: 2012-07-08 | Disposition: A | Discharge status: Home / Self Care | Payer: Medicare Other | Attending: Emergency Medicine | Admitting: Emergency Medicine

## 2012-07-08 DIAGNOSIS — R61 Generalized hyperhidrosis: Secondary | ICD-10-CM | POA: Diagnosis not present

## 2012-07-08 DIAGNOSIS — E1169 Type 2 diabetes mellitus with other specified complication: Secondary | ICD-10-CM | POA: Insufficient documentation

## 2012-07-08 DIAGNOSIS — T148XXA Other injury of unspecified body region, initial encounter: Secondary | ICD-10-CM | POA: Diagnosis not present

## 2012-07-08 DIAGNOSIS — I2510 Atherosclerotic heart disease of native coronary artery without angina pectoris: Secondary | ICD-10-CM | POA: Insufficient documentation

## 2012-07-08 DIAGNOSIS — N189 Chronic kidney disease, unspecified: Secondary | ICD-10-CM | POA: Diagnosis not present

## 2012-07-08 DIAGNOSIS — Z951 Presence of aortocoronary bypass graft: Secondary | ICD-10-CM | POA: Insufficient documentation

## 2012-07-08 DIAGNOSIS — R55 Syncope and collapse: Secondary | ICD-10-CM | POA: Diagnosis not present

## 2012-07-08 DIAGNOSIS — I129 Hypertensive chronic kidney disease with stage 1 through stage 4 chronic kidney disease, or unspecified chronic kidney disease: Secondary | ICD-10-CM | POA: Insufficient documentation

## 2012-07-08 DIAGNOSIS — Z794 Long term (current) use of insulin: Secondary | ICD-10-CM | POA: Insufficient documentation

## 2012-07-08 DIAGNOSIS — R7301 Impaired fasting glucose: Secondary | ICD-10-CM | POA: Diagnosis not present

## 2012-07-08 DIAGNOSIS — E162 Hypoglycemia, unspecified: Secondary | ICD-10-CM

## 2012-07-08 LAB — POCT I-STAT, CHEM 8
Creatinine, Ser: 1.9 mg/dL — ABNORMAL HIGH (ref 0.50–1.35)
Glucose, Bld: 112 mg/dL — ABNORMAL HIGH (ref 70–99)
HCT: 49 % (ref 39.0–52.0)
Hemoglobin: 16.7 g/dL (ref 13.0–17.0)
Potassium: 3.9 mEq/L (ref 3.5–5.1)
Sodium: 142 mEq/L (ref 135–145)
TCO2: 20 mmol/L (ref 0–100)

## 2012-07-08 MED ORDER — DEXTROSE 50 % IV SOLN
INTRAVENOUS | Status: AC
  Administered 2012-07-08: 16:00:00
  Filled 2012-07-08: qty 50

## 2012-07-08 NOTE — ED Notes (Signed)
CBG 89 Rn notified Creg

## 2012-07-08 NOTE — ED Notes (Signed)
CBG 202 Rn notified creg

## 2012-07-08 NOTE — ED Notes (Signed)
Gave patient peanut butter crackers, cheese, orange juice, apple sauce, and [place] sandwich.
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
## 2012-09-23 DIAGNOSIS — E161 Other hypoglycemia: Secondary | ICD-10-CM | POA: Diagnosis not present

## 2012-09-23 DIAGNOSIS — R7301 Impaired fasting glucose: Secondary | ICD-10-CM | POA: Diagnosis not present

## 2012-09-27 DIAGNOSIS — E789 Disorder of lipoprotein metabolism, unspecified: Secondary | ICD-10-CM | POA: Diagnosis not present

## 2012-10-04 DIAGNOSIS — E119 Type 2 diabetes mellitus without complications: Secondary | ICD-10-CM | POA: Diagnosis not present

## 2012-10-04 DIAGNOSIS — E291 Testicular hypofunction: Secondary | ICD-10-CM | POA: Diagnosis not present

## 2012-10-04 DIAGNOSIS — I10 Essential (primary) hypertension: Secondary | ICD-10-CM | POA: Diagnosis not present

## 2012-10-04 DIAGNOSIS — Z23 Encounter for immunization: Secondary | ICD-10-CM | POA: Diagnosis not present

## 2012-10-04 DIAGNOSIS — E789 Disorder of lipoprotein metabolism, unspecified: Secondary | ICD-10-CM | POA: Diagnosis not present

## 2013-01-11 DIAGNOSIS — I10 Essential (primary) hypertension: Secondary | ICD-10-CM | POA: Diagnosis not present

## 2013-01-11 DIAGNOSIS — I2510 Atherosclerotic heart disease of native coronary artery without angina pectoris: Secondary | ICD-10-CM | POA: Diagnosis not present

## 2013-01-11 DIAGNOSIS — E291 Testicular hypofunction: Secondary | ICD-10-CM | POA: Diagnosis not present

## 2013-01-11 DIAGNOSIS — E119 Type 2 diabetes mellitus without complications: Secondary | ICD-10-CM | POA: Diagnosis not present

## 2013-01-12 DIAGNOSIS — E109 Type 1 diabetes mellitus without complications: Secondary | ICD-10-CM | POA: Diagnosis not present

## 2013-01-12 DIAGNOSIS — H524 Presbyopia: Secondary | ICD-10-CM | POA: Diagnosis not present

## 2013-01-17 DIAGNOSIS — Z951 Presence of aortocoronary bypass graft: Secondary | ICD-10-CM | POA: Diagnosis not present

## 2013-01-17 DIAGNOSIS — I10 Essential (primary) hypertension: Secondary | ICD-10-CM | POA: Diagnosis not present

## 2013-01-17 DIAGNOSIS — E782 Mixed hyperlipidemia: Secondary | ICD-10-CM | POA: Diagnosis not present

## 2013-02-06 DIAGNOSIS — E789 Disorder of lipoprotein metabolism, unspecified: Secondary | ICD-10-CM | POA: Diagnosis not present

## 2013-02-18 ENCOUNTER — Encounter (HOSPITAL_COMMUNITY): Payer: Self-pay | Admitting: Emergency Medicine

## 2013-02-18 ENCOUNTER — Emergency Department (HOSPITAL_COMMUNITY)
Admission: EM | Admit: 2013-02-18 | Discharge: 2013-02-18 | Disposition: A | Discharge status: Home / Self Care | Payer: Medicare Other | Attending: Emergency Medicine | Admitting: Emergency Medicine

## 2013-02-18 DIAGNOSIS — N189 Chronic kidney disease, unspecified: Secondary | ICD-10-CM | POA: Diagnosis not present

## 2013-02-18 DIAGNOSIS — Z87891 Personal history of nicotine dependence: Secondary | ICD-10-CM | POA: Insufficient documentation

## 2013-02-18 DIAGNOSIS — G5630 Lesion of radial nerve, unspecified upper limb: Secondary | ICD-10-CM | POA: Insufficient documentation

## 2013-02-18 DIAGNOSIS — I10 Essential (primary) hypertension: Secondary | ICD-10-CM | POA: Insufficient documentation

## 2013-02-18 DIAGNOSIS — Z794 Long term (current) use of insulin: Secondary | ICD-10-CM | POA: Diagnosis not present

## 2013-02-18 DIAGNOSIS — Z951 Presence of aortocoronary bypass graft: Secondary | ICD-10-CM | POA: Diagnosis not present

## 2013-02-18 DIAGNOSIS — Z79899 Other long term (current) drug therapy: Secondary | ICD-10-CM | POA: Insufficient documentation

## 2013-02-18 DIAGNOSIS — I2510 Atherosclerotic heart disease of native coronary artery without angina pectoris: Secondary | ICD-10-CM | POA: Diagnosis not present

## 2013-02-18 DIAGNOSIS — E119 Type 2 diabetes mellitus without complications: Secondary | ICD-10-CM | POA: Diagnosis not present
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
## 2013-02-18 NOTE — ED Notes (Signed)
Pt states "EMS come out yesterday and my sugar was 27, they  Brought me back around but my left arm is not acting right"

## 2013-02-24 DIAGNOSIS — I10 Essential (primary) hypertension: Secondary | ICD-10-CM | POA: Diagnosis not present

## 2013-02-24 DIAGNOSIS — E119 Type 2 diabetes mellitus without complications: Secondary | ICD-10-CM | POA: Diagnosis not present

## 2013-02-24 DIAGNOSIS — M79609 Pain in unspecified limb: Secondary | ICD-10-CM | POA: Diagnosis not present

## 2013-02-28 DIAGNOSIS — E119 Type 2 diabetes mellitus without complications: Secondary | ICD-10-CM | POA: Diagnosis not present

## 2013-02-28 DIAGNOSIS — G589 Mononeuropathy, unspecified: Secondary | ICD-10-CM | POA: Diagnosis not present

## 2013-03-30 DIAGNOSIS — R7301 Impaired fasting glucose: Secondary | ICD-10-CM | POA: Diagnosis not present

## 2013-03-30 DIAGNOSIS — E161 Other hypoglycemia: Secondary | ICD-10-CM | POA: Diagnosis not present

## 2013-05-04 DIAGNOSIS — E119 Type 2 diabetes mellitus without complications: Secondary | ICD-10-CM | POA: Diagnosis not present

## 2013-05-04 DIAGNOSIS — E789 Disorder of lipoprotein metabolism, unspecified: Secondary | ICD-10-CM | POA: Diagnosis not present

## 2013-05-11 DIAGNOSIS — E119 Type 2 diabetes mellitus without complications: Secondary | ICD-10-CM | POA: Diagnosis not present

## 2013-05-11 DIAGNOSIS — E789 Disorder of lipoprotein metabolism, unspecified: Secondary | ICD-10-CM | POA: Diagnosis not present

## 2013-05-11 DIAGNOSIS — I10 Essential (primary) hypertension: Secondary | ICD-10-CM | POA: Diagnosis not present

## 2013-05-12 ENCOUNTER — Encounter (HOSPITAL_COMMUNITY): Payer: Self-pay

## 2013-05-12 ENCOUNTER — Inpatient Hospital Stay (HOSPITAL_COMMUNITY)
Admission: EM | Admit: 2013-05-12 | Discharge: 2013-05-14 | DRG: 639 | Disposition: A | Discharge status: Home / Self Care | Payer: Medicare Other | Attending: Internal Medicine | Admitting: Internal Medicine

## 2013-05-12 ENCOUNTER — Emergency Department (HOSPITAL_COMMUNITY): Payer: Medicare Other

## 2013-05-12 DIAGNOSIS — I10 Essential (primary) hypertension: Secondary | ICD-10-CM

## 2013-05-12 DIAGNOSIS — E161 Other hypoglycemia: Secondary | ICD-10-CM | POA: Diagnosis not present

## 2013-05-12 DIAGNOSIS — I129 Hypertensive chronic kidney disease with stage 1 through stage 4 chronic kidney disease, or unspecified chronic kidney disease: Secondary | ICD-10-CM | POA: Diagnosis present

## 2013-05-12 DIAGNOSIS — E162 Hypoglycemia, unspecified: Secondary | ICD-10-CM

## 2013-05-12 DIAGNOSIS — N183 Chronic kidney disease, stage 3 unspecified: Secondary | ICD-10-CM | POA: Diagnosis not present

## 2013-05-12 DIAGNOSIS — Z951 Presence of aortocoronary bypass graft: Secondary | ICD-10-CM | POA: Diagnosis not present

## 2013-05-12 DIAGNOSIS — J984 Other disorders of lung: Secondary | ICD-10-CM | POA: Diagnosis not present

## 2013-05-12 DIAGNOSIS — E785 Hyperlipidemia, unspecified: Secondary | ICD-10-CM | POA: Diagnosis not present

## 2013-05-12 DIAGNOSIS — Z794 Long term (current) use of insulin: Secondary | ICD-10-CM | POA: Diagnosis not present

## 2013-05-12 DIAGNOSIS — Z87891 Personal history of nicotine dependence: Secondary | ICD-10-CM | POA: Diagnosis not present

## 2013-05-12 DIAGNOSIS — E1169 Type 2 diabetes mellitus with other specified complication: Secondary | ICD-10-CM | POA: Diagnosis not present

## 2013-05-12 DIAGNOSIS — R7301 Impaired fasting glucose: Secondary | ICD-10-CM | POA: Diagnosis not present

## 2013-05-12 DIAGNOSIS — I2510 Atherosclerotic heart disease of native coronary artery without angina pectoris: Secondary | ICD-10-CM

## 2013-05-12 DIAGNOSIS — T383X5A Adverse effect of insulin and oral hypoglycemic [antidiabetic] drugs, initial encounter: Secondary | ICD-10-CM | POA: Diagnosis present

## 2013-05-12 DIAGNOSIS — R55 Syncope and collapse: Secondary | ICD-10-CM

## 2013-05-12 LAB — GLUCOSE, CAPILLARY
Glucose-Capillary: 109 mg/dL — ABNORMAL HIGH (ref 70–99)
Glucose-Capillary: 71 mg/dL (ref 70–99)

## 2013-05-12 LAB — CBC WITH DIFFERENTIAL/PLATELET
Eosinophils Absolute: 0.1 10*3/uL (ref 0.0–0.7)
Eosinophils Relative: 1 % (ref 0–5)
HCT: 48.4 % (ref 39.0–52.0)
Lymphocytes Relative: 8 % — ABNORMAL LOW (ref 12–46)
Lymphs Abs: 1.2 10*3/uL (ref 0.7–4.0)
MCH: 28 pg (ref 26.0–34.0)
MCV: 81.1 fL (ref 78.0–100.0)
Monocytes Absolute: 0.6 10*3/uL (ref 0.1–1.0)
Monocytes Relative: 4 % (ref 3–12)
RBC: 5.97 MIL/uL — ABNORMAL HIGH (ref 4.22–5.81)
WBC: 14.7 10*3/uL — ABNORMAL HIGH (ref 4.0–10.5)

## 2013-05-12 LAB — HEPATIC FUNCTION PANEL
Albumin: 3.9 g/dL (ref 3.5–5.2)
Alkaline Phosphatase: 95 U/L (ref 39–117)
Bilirubin, Direct: 0.2 mg/dL (ref 0.0–0.3)
Indirect Bilirubin: 0.5 mg/dL (ref 0.3–0.9)
Total Bilirubin: 0.7 mg/dL (ref 0.3–1.2)

## 2013-05-12 LAB — URINALYSIS, ROUTINE W REFLEX MICROSCOPIC
Bilirubin Urine: NEGATIVE
Ketones, ur: NEGATIVE mg/dL
Nitrite: NEGATIVE
Protein, ur: NEGATIVE mg/dL
pH: 6.5 (ref 5.0–8.0)

## 2013-05-12 LAB — CBC
Hemoglobin: 15.3 g/dL (ref 13.0–17.0)
MCHC: 34.2 g/dL (ref 30.0–36.0)
Platelets: 176 10*3/uL (ref 150–400)

## 2013-05-12 LAB — MRSA PCR SCREENING: MRSA by PCR: NEGATIVE

## 2013-05-12 LAB — CREATININE, SERUM
Creatinine, Ser: 1.75 mg/dL — ABNORMAL HIGH (ref 0.50–1.35)
GFR calc non Af Amer: 36 mL/min — ABNORMAL LOW (ref >90)

## 2013-05-12 LAB — BASIC METABOLIC PANEL
BUN: 16 mg/dL (ref 6–23)
CO2: 26 mEq/L (ref 19–32)
Calcium: 9.2 mg/dL (ref 8.4–10.5)
Creatinine, Ser: 1.66 mg/dL — ABNORMAL HIGH (ref 0.50–1.35)
GFR calc non Af Amer: 38 mL/min — ABNORMAL LOW (ref >90)
Glucose, Bld: 107 mg/dL — ABNORMAL HIGH (ref 70–99)

## 2013-05-12 LAB — TROPONIN I: Troponin I: <0.3 ng/mL (ref <0.30)

## 2013-05-12 LAB — URINE MICROSCOPIC-ADD ON

## 2013-05-12 MED ORDER — FENOFIBRATE 54 MG PO TABS
54.0000 mg | ORAL_TABLET | Freq: Every day | ORAL | Status: DC
  Administered 2013-05-12 – 2013-05-14 (×3): 54 mg via ORAL
  Filled 2013-05-12 (×3): qty 1

## 2013-05-12 MED ORDER — ACETAMINOPHEN 325 MG PO TABS: 650.0000 mg | ORAL_TABLET | Freq: Four times a day (QID) | ORAL | Status: DC | PRN

## 2013-05-12 MED ORDER — BENAZEPRIL HCL 40 MG PO TABS
40.0000 mg | ORAL_TABLET | Freq: Every day | ORAL | Status: DC
  Administered 2013-05-13 – 2013-05-14 (×2): 40 mg via ORAL
  Filled 2013-05-12 (×2): qty 1

## 2013-05-12 MED ORDER — ONDANSETRON HCL 4 MG/2ML IJ SOLN: 4.0000 mg | Freq: Four times a day (QID) | INTRAMUSCULAR | Status: DC | PRN

## 2013-05-12 MED ORDER — ALUM & MAG HYDROXIDE-SIMETH 200-200-20 MG/5ML PO SUSP: 30.0000 mL | Freq: Four times a day (QID) | ORAL | Status: DC | PRN

## 2013-05-12 MED ORDER — DEXTROSE 50 % IV SOLN
1.0000 | Freq: Once | INTRAVENOUS | Status: AC
  Administered 2013-05-12: 50 mL via INTRAVENOUS
  Filled 2013-05-12: qty 50

## 2013-05-12 MED ORDER — CLONIDINE HCL 0.1 MG PO TABS
0.1000 mg | ORAL_TABLET | Freq: Two times a day (BID) | ORAL | Status: DC
Start: 2013-05-12 — End: 2013-05-14
  Administered 2013-05-12 – 2013-05-14 (×4): 0.1 mg via ORAL
  Filled 2013-05-12 (×5): qty 1

## 2013-05-12 MED ORDER — ONDANSETRON HCL 4 MG PO TABS: 4.0000 mg | ORAL_TABLET | Freq: Four times a day (QID) | ORAL | Status: DC | PRN

## 2013-05-12 MED ORDER — AMLODIPINE BESY-BENAZEPRIL HCL 5-40 MG PO CAPS
1.0000 | ORAL_CAPSULE | Freq: Every day | ORAL | Status: DC
Start: 2013-05-12 — End: 2013-05-12

## 2013-05-12 MED ORDER — DEXTROSE 10 % IV SOLN
INTRAVENOUS | Status: DC
  Administered 2013-05-12: 20:00:00 via INTRAVENOUS
  Filled 2013-05-12: qty 1000

## 2013-05-12 MED ORDER — SODIUM CHLORIDE 0.9 % IJ SOLN
3.0000 mL | Freq: Two times a day (BID) | INTRAMUSCULAR | Status: DC
  Administered 2013-05-13 (×2): 3 mL via INTRAVENOUS

## 2013-05-12 MED ORDER — AMLODIPINE BESYLATE 5 MG PO TABS
5.0000 mg | ORAL_TABLET | Freq: Every day | ORAL | Status: DC
  Administered 2013-05-13 – 2013-05-14 (×2): 5 mg via ORAL
  Filled 2013-05-12 (×2): qty 1

## 2013-05-12 MED ORDER — ALBUTEROL SULFATE (5 MG/ML) 0.5% IN NEBU: 2.5000 mg | INHALATION_SOLUTION | RESPIRATORY_TRACT | Status: DC | PRN

## 2013-05-12 MED ORDER — HEPARIN SODIUM (PORCINE) 5000 UNIT/ML IJ SOLN
5000.0000 [IU] | Freq: Three times a day (TID) | INTRAMUSCULAR | Status: DC
  Administered 2013-05-12 – 2013-05-14 (×5): 5000 [IU] via SUBCUTANEOUS
  Filled 2013-05-12 (×8): qty 1

## 2013-05-12 MED ORDER — NADOLOL 40 MG PO TABS
40.0000 mg | ORAL_TABLET | Freq: Every day | ORAL | Status: DC
  Filled 2013-05-12 (×2): qty 1

## 2013-05-12 MED ORDER — ATORVASTATIN CALCIUM 40 MG PO TABS
40.0000 mg | ORAL_TABLET | Freq: Every day | ORAL | Status: DC
  Administered 2013-05-13: 40 mg via ORAL
  Filled 2013-05-12 (×2): qty 1

## 2013-05-12 MED ORDER — EZETIMIBE 10 MG PO TABS
10.0000 mg | ORAL_TABLET | Freq: Every day | ORAL | Status: DC
  Administered 2013-05-12 – 2013-05-14 (×3): 10 mg via ORAL
  Filled 2013-05-12 (×3): qty 1

## 2013-05-12 MED ORDER — GUAIFENESIN-DM 100-10 MG/5ML PO SYRP
5.0000 mL | ORAL_SOLUTION | ORAL | Status: DC | PRN
  Filled 2013-05-12: qty 5

## 2013-05-12 MED ORDER — ASPIRIN EC 81 MG PO TBEC
81.0000 mg | DELAYED_RELEASE_TABLET | Freq: Every day | ORAL | Status: DC
  Administered 2013-05-12 – 2013-05-14 (×3): 81 mg via ORAL
  Filled 2013-05-12 (×3): qty 1

## 2013-05-12 MED ORDER — ACETAMINOPHEN 650 MG RE SUPP: 650.0000 mg | Freq: Four times a day (QID) | RECTAL | Status: DC | PRN

## 2013-05-12 MED ORDER — FUROSEMIDE 40 MG PO TABS
40.0000 mg | ORAL_TABLET | Freq: Every day | ORAL | Status: DC
  Administered 2013-05-12 – 2013-05-14 (×3): 40 mg via ORAL
  Filled 2013-05-12 (×3): qty 1

## 2013-05-12 NOTE — H&P (Signed)
PATIENT DETAILS Name: [name] Age: 79 y.o. Sex: male Date of Birth: [birth year] Admit Date: 05/12/2013 [id number] [name], MD   CHIEF COMPLAINT:  Hypoglycemia  HPI: [name] is a 79 y.o. male with a Past Medical History of diabetes on insulin 50/50, CAD status post CABG, dyslipidemia, chronic kidney disease stage III who presents today with the above noted complaint. Per patient, he has been a diabetic for 15-20 years and occasionally does get hypo-glycemic episodes. He claims he thinks he had one hypoglycemic episode yesterday based on symptoms, however no one at home check his sugars and he took glucose tablets with resolving symptoms. This morning he claims that his sugars at home were approximately 177, instead of taking his usual dose of 60 units of insulin 70/30, he took 50 units. He had his breakfast his usual time and then went out for an errand and, when he got back home he was excessively sleepy and drowsy, his family noticed that he was grunting while sleeping and suspected that he had hypoglycemia. The attempted to give him glucose tablet and other high glucose content food, however the patient could not be aroused enough to eat, hence they called EMS. Per EMS, initially patient had a CBG of 24 at their arrival. He was given D50, and patient got more responsive. When the patient arrived in the emergency room, CBG was 60, thereafter it dropped to 44. She has so far received third amp of D50, and has been started on a D5 infusion. Patient denies inadvertently taking excessive units of insulin, apart from Actos he is not on any oral hypoglycemic agents. He claims that a few months ago, he was taking 80 units of insulin twice daily, but the dosing was reduced to 60 units twice daily by his primary care practitioner recently. Patient is now being admitted for further evaluation and treatment   ALLERGIES:  No Known Allergies  PAST MEDICAL HISTORY: Past Medical History   Diagnosis Date  . Diabetes mellitus   . Coronary artery disease   . Hypertension   . Chronic kidney disease     PAST SURGICAL HISTORY: Past Surgical History  Procedure Laterality Date  . Coronary artery bypass graft    . Hernia repair      MEDICATIONS AT HOME: Prior to Admission medications   Medication Sig Start Date End Date Taking? Authorizing Provider  amLODipine-benazepril (LOTREL) 5-40 MG per capsule Take 1 capsule by mouth daily.   Yes Historical Provider, MD  Choline Fenofibrate (TRILIPIX) 45 MG capsule Take 45 mg by mouth daily.   Yes Historical Provider, MD  cloNIDine (CATAPRES) 0.1 MG tablet Take 0.1 mg by mouth 2 (two) times daily.   Yes Historical Provider, MD  ezetimibe (ZETIA) 10 MG tablet Take 10 mg by mouth daily.   Yes Historical Provider, MD  furosemide (LASIX) 40 MG tablet Take 40 mg by mouth daily.   Yes Historical Provider, MD  insulin lispro protamine-insulin lispro (HUMALOG 50/50) (50-50) 100 UNIT/ML SUSP Inject 60 Units into the skin 2 (two) times daily before a meal.    Yes Historical Provider, MD  nadolol (CORGARD) 40 MG tablet Take 40 mg by mouth daily.   Yes Historical Provider, MD  pioglitazone (ACTOS) 30 MG tablet Take 30 mg by mouth daily.   Yes Historical Provider, MD  rosuvastatin (CRESTOR) 20 MG tablet Take 20 mg by mouth daily.   Yes Historical Provider, MD    FAMILY HISTORY: No family history on file.  SOCIAL  HISTORY:  reports that he has quit smoking. He does not have any smokeless tobacco history on file. He reports that he does not drink alcohol or use illicit drugs.  REVIEW OF SYSTEMS:  Constitutional:   No  weight loss, night sweats,  Fevers, chills, fatigue.  HEENT:    No headaches, Difficulty swallowing,Tooth/dental problems,Sore throat,  No sneezing, itching, ear ache, nasal congestion, post nasal drip,   Cardio-vascular: No chest pain,  Orthopnea, PND, swelling in lower extremities, anasarca,dizziness, palpitations  GI:  No  heartburn, indigestion, abdominal pain, nausea, vomiting, diarrhea, change in bowel habits, loss of appetite  Resp: No shortness of breath with exertion or at rest.  No excess mucus, no productive cough, No non-productive cough,  No coughing up of blood.No change in color of mucus.No wheezing.No chest wall deformity  Skin:  no rash or lesions.  GU:  no dysuria, change in color of urine, no urgency or frequency.  No flank pain.  Musculoskeletal: No joint pain or swelling.  No decreased range of motion.  No back pain.  Psych: No change in mood or affect. No depression or anxiety.  No memory loss.   PHYSICAL EXAM: Blood pressure 152/83, pulse 49, temperature 98.3 F (36.8 C), temperature source Oral, resp. rate 16, SpO2 97.00%.  General appearance :Awake, alert, not in any distress. Speech Clear. Not toxic Looking HEENT: Atraumatic and Normocephalic, pupils equally reactive to light and accomodation Neck: supple, no JVD. No cervical lymphadenopathy.  Chest:Good air entry bilaterally, no added sounds  CVS: S1 S2 regular, no murmurs.  Abdomen: Bowel sounds present, Non tender and not distended with no gaurding, rigidity or rebound. Extremities: B/L Lower Ext shows no edema, both legs are warm to touch Neurology: Awake alert, and oriented X 3, CN II-XII intact, Non focal Skin:No Rash Wounds:N/A  LABS ON ADMISSION:   Recent Labs  05/12/13 1544  NA 143  K 3.6  CL 107  CO2 26  GLUCOSE 107*  BUN 16  CREATININE 1.66*  CALCIUM 9.2    Recent Labs  05/12/13 1544  AST 23  ALT 14  ALKPHOS 95  BILITOT 0.7  PROT 7.7  ALBUMIN 3.9   No results found for this basename: LIPASE, AMYLASE,  in the last 72 hours  Recent Labs  05/12/13 1544  WBC 14.7*  NEUTROABS 12.7*  HGB 16.7  HCT 48.4  MCV 81.1  PLT 188    Recent Labs  05/12/13 1545  TROPONINI <0.30   No results found for this basename: DDIMER,  in the last 72 hours No components found with this basename: POCBNP,     RADIOLOGIC STUDIES ON ADMISSION: Dg Chest Port 1 View  05/12/2013   *RADIOLOGY REPORT*  Clinical Data: Diabetic, ex-smoker, evaluate for pneumonia  PORTABLE CHEST - 1 VIEW  Comparison: 01/14/2012  Findings: Chronic interstitial markings.  No focal consolidation. No pleural effusion or pneumothorax.  Cardiomegaly. Postsurgical changes related to prior CABG.  IMPRESSION: No evidence of acute cardiopulmonary disease.   Original Report Authenticated By: Julian Hy, M.D.   ASSESSMENT AND PLAN: Present on Admission:  . Hypoglycemia - Likely secondary to insulin therapy-likely patient did not keep up with oral intake as a result; resultant hypoglycemia.  - He is also on Actos but generally does not cause hypoglycemia, suspect that since is on NPH/NovoLog combination, he will be hypoglycemic for a few more hours as he last took insulin around 9:00 this morning.  - Since he is persistently hypoglycemic and needs close monitoring, will  be admitted to this unit, will place him on a D10 infusion. CBGs will be checked every 2 hours. We will liberalize his diet and gave him a regular diet. Once his sugars are stable, then we can discontinue D10 infusion and then place him on a diabetic diet. - Check A1c, he clearly needs his insulin dose was reduced by discharge.  Marland Kitchen CAD (coronary artery disease) - Continue with aspirin, beta blockers and statin  - His chest pain-free   . CKD (chronic kidney disease) stage III  - Monitor electrolytes closely.   Marland Kitchen HTN (hypertension) - June with usual antihypertensive medications. Adjust BP depending on blood pressure trend   . Dyslipidemia - Continue with statins  Further plan will depend as patient's clinical course evolves and further radiologic and laboratory data become available. Patient will be monitored closely.   DVT Prophylaxis: Prophylactic  Heparin  Code Status: Full Code  Total time spent for admission equals 45  minutes.  [facility] Pager [phone]  If 7PM-7AM, please contact night-coverage [email or web address] Password [email or web address] 05/12/2013, 5:59 PM

## 2013-05-12 NOTE — ED Notes (Signed)
Pt drinking orange juice 

## 2013-05-12 NOTE — ED Notes (Signed)
PER EMS - upon ems arrival pt was not and oriented and CBG was 24. They administered 25g of D50 and CGB went up to 230.  It took about 15 minutes to be A&O x 4. The patients wife stated it was taking longer that usual for the pt to become alert and oriented, sluggish. They checked CBG after about 20 minutes and it went back down to 60. They initiated a D10 drip, rechecked CBG and was 115 just minutes prior to arrival to this ED. VS: BP-150/80, HR-50s, sinus brady, RR-16, snoring, and O2-98% 2L [place].

## 2013-05-12 NOTE — ED Notes (Signed)
Pt eating meal. No distress noted

## 2013-05-12 NOTE — ED Provider Notes (Signed)
History     CSN: [id number]  Arrival date & time 05/12/13  1451   First MD Initiated Contact with Patient 05/12/13 1513      Chief Complaint  Patient presents with  . Hypoglycemia    (Consider location/radiation/quality/duration/timing/severity/associated sxs/prior treatment) HPI Comments: 79 year-old male with history of diabetes mellitus type 2, coronary disease status post CABG, hypertension, hyperlipidemia comes in with cc of hypoglycemia. Per EMS, at their arrival, patient had a CBG of 24. D50 give, patient became more responsive as sugars responded. At ED arrival, patient's CBG was 60, and he was feeling a little weak again, so another amp of d50 was administered. Patient's blood sugar dropped to 44 thereafter, and we administered 3rd d50, started d5 gtt and started feeding the patient. Patient takes 60 units of insulin bid humolog. Today he took only 50 units. No recent illnesses. Pt denies nausea, emesis, fevers, chills, chest pains, shortness of breath, headaches, abdominal pain, uti like symptoms. Has had hx of low sugars in the past, but not like these.     Patient is a 79 y.o. male presenting with hypoglycemia. The history is provided by the patient, medical records and the EMS personnel.  Hypoglycemia Associated symptoms: dizziness and tremors   Associated symptoms: no seizures and no shortness of breath     Past Medical History  Diagnosis Date  . Diabetes mellitus   . Coronary artery disease   . Hypertension   . Chronic kidney disease     Past Surgical History  Procedure Laterality Date  . Coronary artery bypass graft    . Hernia repair      No family history on file.  History  Substance Use Topics  . Smoking status: Former Research scientist (life sciences)  . Smokeless tobacco: Not on file  . Alcohol Use: No      Review of Systems  Constitutional: Positive for fatigue. Negative for fever, chills and activity change.  HENT: Negative for neck pain.   Eyes: Negative for  visual disturbance.  Respiratory: Negative for cough, chest tightness and shortness of breath.   Cardiovascular: Negative for chest pain.  Gastrointestinal: Negative for abdominal distention.  Genitourinary: Negative for dysuria, enuresis and difficulty urinating.  Musculoskeletal: Negative for arthralgias.  Neurological: Positive for dizziness, tremors and weakness. Negative for seizures, light-headedness and headaches.  Psychiatric/Behavioral: Negative for confusion.    Allergies  Review of patient's allergies indicates no known allergies.  Home Medications   Current Outpatient Rx  Name  Route  Sig  Dispense  Refill  . Choline Fenofibrate 45 MG capsule   Oral   Take 45 mg by mouth daily.         . cloNIDine (CATAPRES) 0.1 MG tablet   Oral   Take 0.1 mg by mouth 2 (two) times daily.         Marland Kitchen ezetimibe (ZETIA) 10 MG tablet   Oral   Take 10 mg by mouth daily.         . insulin lispro protamine-insulin lispro (HUMALOG 50/50) (50-50) 100 UNIT/ML SUSP   Subcutaneous   Inject 50 Units into the skin 2 (two) times daily before a meal.          . nadolol (CORGARD) 40 MG tablet   Oral   Take 40 mg by mouth daily.           BP 166/76  Pulse 50  Temp(Src) 98.3 F (36.8 C) (Oral)  Resp 16  SpO2 96%  Physical Exam  Nursing  note and vitals reviewed. Constitutional: He is oriented to person, place, and time. He appears well-developed.  HENT:  Head: Normocephalic and atraumatic.  Eyes: Conjunctivae and EOM are normal. Pupils are equal, round, and reactive to light.  Neck: Normal range of motion. Neck supple.  Cardiovascular: Normal rate and regular rhythm.   Pulmonary/Chest: Effort normal and breath sounds normal.  Abdominal: Soft. Bowel sounds are normal. He exhibits no distension. There is no tenderness. There is no rebound and no guarding.  Neurological: He is alert and oriented to person, place, and time.  Skin: Skin is warm.    ED Course  Procedures  (including critical care time)  Labs Reviewed  GLUCOSE, CAPILLARY - Abnormal; Notable for the following:    Glucose-Capillary 44 (*)    All other components within normal limits  GLUCOSE, CAPILLARY   No results found.   No diagnosis found.    MDM  Pt comes in with cc of low sugar.  DDx: Sepsis syndrome ACS syndrome DKA ICH Stroke CHF exacerbation COPD exacerbation Infection - pneumonia/UTI/Cellulitis PE Dehydration Electrolyte abnormality Tox syndrome    Pt has IDDM. No new meds, no changes to current regimen, No decreased po intake, no emesis, diarrhea. Pt's hx not suggestive of underlying lung or liver dz. Pt has CKD - so will ensure he is not having renal failure. Will screen for ACS.   Date: 05/12/2013  Rate: 55  Rhythm: sinus bradycardia  QRS Axis: normal  Intervals: normal  ST/T Wave abnormalities: nonspecific ST/T changes  Conduction Disutrbances:right bundle branch block  Narrative Interpretation:   Old EKG Reviewed: changes noted - new RBBB, new RSR in v2, v3,   5:19 PM Continues to drop the blood sugar. Last check was 70 despite maintenance and feeding. Will need admission. S/p 3 amps of d50, now on d10 maintenance.  CRITICAL CARE Performed by: Varney Biles   Total critical care time: 45 minutes  Critical care time was exclusive of separately billable procedures and treating other patients.  Critical care was necessary to treat or prevent imminent or life-threatening deterioration.  Critical care was time spent personally by me on the following activities: development of treatment plan with patient and/or surrogate as well as nursing, discussions with consultants, evaluation of patient's response to treatment, examination of patient, obtaining history from patient or surrogate, ordering and performing treatments and interventions, ordering and review of laboratory studies, ordering and review of radiographic studies, pulse oximetry and  re-evaluation of patient's condition.     Varney Biles, MD 05/12/13 1720

## 2013-05-13 DIAGNOSIS — E162 Hypoglycemia, unspecified: Secondary | ICD-10-CM | POA: Diagnosis not present

## 2013-05-13 DIAGNOSIS — E785 Hyperlipidemia, unspecified: Secondary | ICD-10-CM | POA: Diagnosis not present

## 2013-05-13 DIAGNOSIS — I2510 Atherosclerotic heart disease of native coronary artery without angina pectoris: Secondary | ICD-10-CM | POA: Diagnosis not present

## 2013-05-13 LAB — CBC
MCH: 28.1 pg (ref 26.0–34.0)
MCHC: 35 g/dL (ref 30.0–36.0)
Platelets: 182 10*3/uL (ref 150–400)
RBC: 5.56 MIL/uL (ref 4.22–5.81)
RDW: 14.4 % (ref 11.5–15.5)

## 2013-05-13 LAB — GLUCOSE, CAPILLARY
Glucose-Capillary: 210 mg/dL — ABNORMAL HIGH (ref 70–99)
Glucose-Capillary: 221 mg/dL — ABNORMAL HIGH (ref 70–99)
Glucose-Capillary: 214 mg/dL — ABNORMAL HIGH (ref 70–99)
Glucose-Capillary: 233 mg/dL — ABNORMAL HIGH (ref 70–99)
Glucose-Capillary: 210 mg/dL — ABNORMAL HIGH (ref 70–99)
Glucose-Capillary: 239 mg/dL — ABNORMAL HIGH (ref 70–99)
Glucose-Capillary: 266 mg/dL — ABNORMAL HIGH (ref 70–99)

## 2013-05-13 LAB — BASIC METABOLIC PANEL
Calcium: 8.8 mg/dL (ref 8.4–10.5)
GFR calc Af Amer: 41 mL/min — ABNORMAL LOW (ref >90)
GFR calc non Af Amer: 35 mL/min — ABNORMAL LOW (ref >90)
Sodium: 138 mEq/L (ref 135–145)

## 2013-05-13 LAB — HEMOGLOBIN A1C
Hgb A1c MFr Bld: 6.8 % — ABNORMAL HIGH (ref <5.7)
Mean Plasma Glucose: 148 mg/dL — ABNORMAL HIGH (ref <117)

## 2013-05-13 LAB — URINE CULTURE

## 2013-05-13 MED ORDER — NADOLOL 40 MG PO TABS
40.0000 mg | ORAL_TABLET | Freq: Every day | ORAL | Status: DC
  Filled 2013-05-13: qty 1

## 2013-05-13 MED ORDER — INSULIN DETEMIR 100 UNIT/ML ~~LOC~~ SOLN
12.0000 [IU] | Freq: Every day | SUBCUTANEOUS | Status: DC
  Administered 2013-05-13: 12 [IU] via SUBCUTANEOUS
  Filled 2013-05-13 (×2): qty 0.12

## 2013-05-13 MED ORDER — INSULIN ASPART 100 UNIT/ML ~~LOC~~ SOLN
0.0000 [IU] | Freq: Three times a day (TID) | SUBCUTANEOUS | Status: DC
  Administered 2013-05-13 – 2013-05-14 (×2): 3 [IU] via SUBCUTANEOUS

## 2013-05-13 NOTE — Progress Notes (Signed)
Pt transported by wheelchair to [place]; all belongings returned to patient and family; report given to RN on [id number]; no complications noted

## 2013-05-13 NOTE — Progress Notes (Signed)
Pt transferred to room with wife at bedside. Report received from RN. Pt to bed and oriented to room. Pt instructed on call bell and call bell placed in reach.  Assessment done.

## 2013-05-13 NOTE — Progress Notes (Signed)
PATIENT DETAILS Name: [name] Age: 79 y.o. Sex: male Date of Birth: 11/14/1932 Admit Date: 05/12/2013 Admitting Physician Evalee Mutton Kristeen Mans, MD [id number] [name], MD  Subjective: No major complaint  Assessment/Plan: Hypoglycemia  - Likely secondary to insulin therapy-likely patient did not keep up with oral intake as a result; resultant hypoglycemia. - Hypoglycemia seems to have resolved, stop D 10 infusion, based on SSI and start Levemir 12 units - A1c is 6.8 - Monitor for another 24 hours and possible discharge tomorrow if no further episodes of hypoglycemia  CAD (coronary artery disease)  - Continue with aspirin, beta blockers and statin  - His chest pain-free   . CKD (chronic kidney disease) stage III  - Monitor electrolytes closely. Creatinine at baseline  . HTN (hypertension)  - Continue with usual antihypertensive medications. Adjust BP depending on blood pressure trend   . Dyslipidemia  - Continue with statins  Disposition: Remain inpatient- hopefully home tomorrow morning  DVT Prophylaxis: Prophylactic Heparin   Code Status: Full code   Family Communication None at bedside  Procedures:  None  CONSULTS:  None   MEDICATIONS: Scheduled Meds: . amLODipine  5 mg Oral Daily   And  . benazepril  40 mg Oral Daily  . aspirin EC  81 mg Oral Daily  . atorvastatin  40 mg Oral q1800  . cloNIDine  0.1 mg Oral BID  . ezetimibe  10 mg Oral Daily  . fenofibrate  54 mg Oral Daily  . furosemide  40 mg Oral Daily  . heparin  5,000 Units Subcutaneous Q8H  . insulin aspart  0-9 Units Subcutaneous TID WC  . insulin detemir  12 Units Subcutaneous QHS  . [START ON 05/14/2013] nadolol  40 mg Oral Daily  . sodium chloride  3 mL Intravenous Q12H   Continuous Infusions:  PRN Meds:.acetaminophen, acetaminophen, albuterol, alum & mag hydroxide-simeth, guaiFENesin-dextromethorphan, ondansetron (ZOFRAN) IV, ondansetron  Antibiotics: Anti-infectives   None        PHYSICAL EXAM: Vital signs in last 24 hours: Filed Vitals:   05/13/13 0500 05/13/13 0800 05/13/13 1200 05/13/13 1307  BP: 128/62  146/78 150/77  Pulse: 48 52 49 51  Temp: 98 F (36.7 C) 97.9 F (36.6 C) 98 F (36.7 C) 97.6 F (36.4 C)  TempSrc: Oral Oral Oral Axillary  Resp: 13  17 16   Height:    5\' 9"  (1.753 m)  Weight:    100 kg (220 lb 7.4 oz)  SpO2: 95% 96%  95%    Weight change:  Filed Weights   05/12/13 1943 05/13/13 0004 05/13/13 1307  Weight: 100.9 kg (222 lb 7.1 oz) 99 kg (218 lb 4.1 oz) 100 kg (220 lb 7.4 oz)   Body mass index is 32.54 kg/(m^2).   Gen Exam: Awake and alert with clear speech.   Neck: Supple, No JVD.   Chest: B/L Clear.   CVS: S1 S2 Regular, no murmurs.  Abdomen: soft, BS +, non tender, non distended.  Extremities: no edema, lower extremities warm to touch. Neurologic: Non Focal.   Skin: No Rash.   Wounds: N/A.    Intake/Output from previous day:  Intake/Output Summary (Last 24 hours) at 05/13/13 1655 Last data filed at 05/13/13 0800  Gross per 24 hour  Intake    440 ml  Output    900 ml  Net   -460 ml     LAB RESULTS: CBC  Recent Labs Lab 05/12/13 1544 05/12/13 2034 05/13/13 0545  WBC 14.7* 10.8* 8.7  HGB 16.7 15.3 15.6  HCT 48.4 44.8 44.6  PLT 188 176 182  MCV 81.1 81.0 80.2  MCH 28.0 27.7 28.1  MCHC 34.5 34.2 35.0  RDW 14.4 14.5 14.4  LYMPHSABS 1.2  --   --   MONOABS 0.6  --   --   EOSABS 0.1  --   --   BASOSABS 0.0  --   --     Chemistries   Recent Labs Lab 05/12/13 1544 05/12/13 2034 05/13/13 0545  NA 143  --  138  K 3.6  --  3.5  CL 107  --  104  CO2 26  --  22  GLUCOSE 107*  --  216*  BUN 16  --  14  CREATININE 1.66* 1.75* 1.77*  CALCIUM 9.2  --  8.8    CBG:  Recent Labs Lab 05/13/13 0605 05/13/13 0803 05/13/13 1023 05/13/13 1214 05/13/13 1403  GLUCAP 214* 202* 296* 233* 210*    GFR Estimated Creatinine Clearance: 40.1 ml/min (by C-G formula based on Cr of 1.77).  Coagulation  profile No results found for this basename: INR, PROTIME,  in the last 168 hours  Cardiac Enzymes  Recent Labs Lab 05/12/13 1545  TROPONINI <0.30    No components found with this basename: POCBNP,  No results found for this basename: DDIMER,  in the last 72 hours  Recent Labs  05/12/13 2034  HGBA1C 6.8*   No results found for this basename: CHOL, HDL, LDLCALC, TRIG, CHOLHDL, LDLDIRECT,  in the last 72 hours No results found for this basename: TSH, T4TOTAL, FREET3, T3FREE, THYROIDAB,  in the last 72 hours No results found for this basename: VITAMINB12, FOLATE, FERRITIN, TIBC, IRON, RETICCTPCT,  in the last 72 hours No results found for this basename: LIPASE, AMYLASE,  in the last 72 hours  Urine Studies No results found for this basename: UACOL, UAPR, USPG, UPH, UTP, UGL, UKET, UBIL, UHGB, UNIT, UROB, ULEU, UEPI, UWBC, URBC, UBAC, CAST, CRYS, UCOM, BILUA,  in the last 72 hours  MICROBIOLOGY: Recent Results (from the past 240 hour(s))  MRSA PCR SCREENING     Status: None   Collection Time    05/12/13  7:47 PM      Result Value Range Status   MRSA by PCR NEGATIVE  NEGATIVE Final   Comment:            The GeneXpert MRSA Assay (FDA     approved for NASAL specimens     only), is one component of a     comprehensive MRSA colonization     surveillance program. It is not     intended to diagnose MRSA     infection nor to guide or     monitor treatment for     MRSA infections.    RADIOLOGY STUDIES/RESULTS: Dg Chest Port 1 View  05/12/2013   *RADIOLOGY REPORT*  Clinical Data: Diabetic, ex-smoker, evaluate for pneumonia  PORTABLE CHEST - 1 VIEW  Comparison: 01/14/2012  Findings: Chronic interstitial markings.  No focal consolidation. No pleural effusion or pneumothorax.  Cardiomegaly. Postsurgical changes related to prior CABG.  IMPRESSION: No evidence of acute cardiopulmonary disease.   Original Report Authenticated By: Julian Hy, M.D.    Oren Binet, MD  [facility] Pager:[phone]  If 7PM-7AM, please contact night-coverage [email or web address] Password [email or web address] 05/13/2013, 4:55 PM   LOS: 1 day

## 2013-05-14 DIAGNOSIS — E162 Hypoglycemia, unspecified: Secondary | ICD-10-CM | POA: Diagnosis not present

## 2013-05-14 DIAGNOSIS — E785 Hyperlipidemia, unspecified: Secondary | ICD-10-CM | POA: Diagnosis not present

## 2013-05-14 DIAGNOSIS — I2510 Atherosclerotic heart disease of native coronary artery without angina pectoris: Secondary | ICD-10-CM | POA: Diagnosis not present

## 2013-05-14 LAB — GLUCOSE, CAPILLARY
Glucose-Capillary: 222 mg/dL — ABNORMAL HIGH (ref 70–99)
Glucose-Capillary: 245 mg/dL — ABNORMAL HIGH (ref 70–99)
Glucose-Capillary: 249 mg/dL — ABNORMAL HIGH (ref 70–99)

## 2013-05-14 MED ORDER — INSULIN DETEMIR 100 UNIT/ML ~~LOC~~ SOLN: 14.0000 [IU] | Freq: Every day | SUBCUTANEOUS | Status: DC

## 2013-05-14 MED ORDER — INSULIN DETEMIR 100 UNIT/ML ~~LOC~~ SOLN
14.0000 [IU] | Freq: Every day | SUBCUTANEOUS | Status: DC
  Filled 2013-05-14: qty 0.14

## 2013-05-14 NOTE — Progress Notes (Signed)
Pt discharged via wheelchair to family.

## 2013-05-14 NOTE — Progress Notes (Signed)
Discharge instructions given to pt and wife. IV d/c'd per protocol.

## 2013-05-14 NOTE — Discharge Summary (Signed)
PATIENT DETAILS Name: [name] Age: 79 y.o. Sex: male Date of Birth: 16-Dec-1931 MRN: [id number]. Admit Date: 05/12/2013 Admitting Physician: Joseph Osgood, MD [id number] [name], MD  Recommendations for Outpatient Follow-up:  1. Please optimize insulin regimen-we have stopped insulin 50/50 because of hypoglycemia and has started him on low-dose Levemir. Please make further adjustments depending on patient's CBGs.  PRIMARY DISCHARGE DIAGNOSIS:  Active Problems:   Hypoglycemia   CKD (chronic kidney disease)   CAD (coronary artery disease)   HTN (hypertension)   Dyslipidemia      PAST MEDICAL HISTORY: Past Medical History  Diagnosis Date  . Diabetes mellitus   . Coronary artery disease   . Hypertension   . Chronic kidney disease     DISCHARGE MEDICATIONS:   Medication List    STOP taking these medications       insulin lispro protamine-lispro (50-50) 100 UNIT/ML Susp  Commonly known as:  HUMALOG 50/50      TAKE these medications       amLODipine-benazepril 5-40 MG per capsule  Commonly known as:  LOTREL  Take 1 capsule by mouth daily.     cloNIDine 0.1 MG tablet  Commonly known as:  CATAPRES  Take 0.1 mg by mouth 2 (two) times daily.     ezetimibe 10 MG tablet  Commonly known as:  ZETIA  Take 10 mg by mouth daily.     furosemide 40 MG tablet  Commonly known as:  LASIX  Take 40 mg by mouth daily.     insulin detemir 100 UNIT/ML injection  Commonly known as:  LEVEMIR  Inject 0.14 mLs (14 Units total) into the skin at bedtime.     nadolol 40 MG tablet  Commonly known as:  CORGARD  Take 40 mg by mouth daily.     pioglitazone 30 MG tablet  Commonly known as:  ACTOS  Take 30 mg by mouth daily.     rosuvastatin 20 MG tablet  Commonly known as:  CRESTOR  Take 20 mg by mouth daily.     TRILIPIX 45 MG capsule  Generic drug:  Choline Fenofibrate  Take 45 mg by mouth daily.        ALLERGIES:  No Known Allergies  BRIEF HPI:  See H&P,  Labs, Consult and Test reports for all details in brief, patient is a 79 year old male with a history of diabetes on insulin 50/50, coronary artery disease status post CABG, chronic kidney disease who presented to the emergency room with hypoglycemia. He was admitted for further evaluation and treatment.  CONSULTATIONS:   None  PERTINENT RADIOLOGIC STUDIES: Dg Chest Port 1 View  05/12/2013   *RADIOLOGY REPORT*  Clinical Data: Diabetic, ex-smoker, evaluate for pneumonia  PORTABLE CHEST - 1 VIEW  Comparison: 01/14/2012  Findings: Chronic interstitial markings.  No focal consolidation. No pleural effusion or pneumothorax.  Cardiomegaly. Postsurgical changes related to prior CABG.  IMPRESSION: No evidence of acute cardiopulmonary disease.   Original Report Authenticated By: Julian Hy, M.D.     PERTINENT LAB RESULTS: CBC:  Recent Labs  05/12/13 2034 05/13/13 0545  WBC 10.8* 8.7  HGB 15.3 15.6  HCT 44.8 44.6  PLT 176 182   CMET CMP     Component Value Date/Time   NA 138 05/13/2013 0545   K 3.5 05/13/2013 0545   CL 104 05/13/2013 0545   CO2 22 05/13/2013 0545   GLUCOSE 216* 05/13/2013 0545   BUN 14 05/13/2013 0545   CREATININE 1.77* 05/13/2013 0545  CALCIUM 8.8 05/13/2013 0545   PROT 7.7 05/12/2013 1544   ALBUMIN 3.9 05/12/2013 1544   AST 23 05/12/2013 1544   ALT 14 05/12/2013 1544   ALKPHOS 95 05/12/2013 1544   BILITOT 0.7 05/12/2013 1544   GFRNONAA 35* 05/13/2013 0545   GFRAA 41* 05/13/2013 0545    GFR Estimated Creatinine Clearance: 39.8 ml/min (by C-G formula based on Cr of 1.77). No results found for this basename: LIPASE, AMYLASE,  in the last 72 hours  Recent Labs  05/12/13 1545  TROPONINI <0.30   No components found with this basename: POCBNP,  No results found for this basename: DDIMER,  in the last 72 hours  Recent Labs  05/12/13 2034  HGBA1C 6.8*   No results found for this basename: CHOL, HDL, LDLCALC, TRIG, CHOLHDL, LDLDIRECT,  in the last 72 hours No results found for  this basename: TSH, T4TOTAL, FREET3, T3FREE, THYROIDAB,  in the last 72 hours No results found for this basename: VITAMINB12, FOLATE, FERRITIN, TIBC, IRON, RETICCTPCT,  in the last 72 hours Coags: No results found for this basename: PT, INR,  in the last 72 hours Microbiology: Recent Results (from the past 240 hour(s))  URINE CULTURE     Status: None   Collection Time    05/12/13  3:43 PM      Result Value Range Status   Specimen Description URINE, CLEAN CATCH   Final   Special Requests NONE   Final   Culture  Setup Time 05/12/2013 16:15   Final   Colony Count 3,000 COLONIES/ML   Final   Culture INSIGNIFICANT GROWTH   Final   Report Status 05/13/2013 FINAL   Final  MRSA PCR SCREENING     Status: None   Collection Time    05/12/13  7:47 PM      Result Value Range Status   MRSA by PCR NEGATIVE  NEGATIVE Final   Comment:            The GeneXpert MRSA Assay (FDA     approved for NASAL specimens     only), is one component of a     comprehensive MRSA colonization     surveillance program. It is not     intended to diagnose MRSA     infection nor to guide or     monitor treatment for     MRSA infections.     BRIEF HOSPITAL COURSE:   Active Problems: Hypoglycemia  - Likely secondary to insulin therapy and underlying chronic kidney disease -likely patient did not keep up with oral intake as a result; resultant hypoglycemia - Patient was admitted initially to the step down unit, he was started on D10 infusion, all insulin and oral hypoglycemic agents were placed on hold. Once his hypoglycemia resolved, D 10 infusion was stopped. He was then started on low-dose Levemir 12 units at bedtime and was also placed on sliding scale insulin. He was observed for another 24 hours, he has had no further hypoglycemic episodes and his sugars have been mostly in the low 200s range. On discharge we will increase his Levemir to 14 units. I have asked him to check his sugars at least 3-4 times a day, keep  a record of these and presented to his primary care practitioner at his next visit. Patient has been educated extensively about the importance of adequate glycemic control and symptoms of hypoglycemia and related interventions if he ever became hypoglycemic again.  CAD (coronary artery disease)  - Continue  with aspirin, beta blockers and statin  -This was stable during his admission   . CKD (chronic kidney disease) stage III  - Creatinine at baseline   . HTN (hypertension)  - Continue with usual antihypertensive medications. Adjust BP depending on blood pressure trend   . Dyslipidemia  - Continue with statins  TODAY-DAY OF DISCHARGE:  Subjective:   [name] today has no headache,no chest abdominal pain,no new weakness tingling or numbness, feels much better wants to go home today.   Objective:   Blood pressure 151/77, pulse 55, temperature 98.1 F (36.7 C), temperature source Oral, resp. rate 16, height 5\' 9"  (1.753 m), weight 98.6 kg (217 lb 6 oz), SpO2 99.00%.  Intake/Output Summary (Last 24 hours) at 05/14/13 0820 Last data filed at 05/14/13 0500  Gross per 24 hour  Intake    880 ml  Output      0 ml  Net    880 ml   Filed Weights   05/13/13 1307 05/13/13 2214 05/14/13 0500  Weight: 100 kg (220 lb 7.4 oz) 100.001 kg (220 lb 7.4 oz) 98.6 kg (217 lb 6 oz)    Exam Awake Alert, Oriented *3, No new F.N deficits, Normal affect [place].AT,PERRAL Supple Neck,No JVD, No cervical lymphadenopathy appriciated.  Symmetrical Chest wall movement, Good air movement bilaterally, CTAB RRR,No Gallops,Rubs or new Murmurs, No Parasternal Heave +ve B.Sounds, Abd Soft, Non tender, No organomegaly appriciated, No rebound -guarding or rigidity. No Cyanosis, Clubbing or edema, No new Rash or bruise  DISCHARGE CONDITION: Stable  DISPOSITION: Home  DISCHARGE INSTRUCTIONS:    Activity:  As tolerated  Diet recommendation: Diabetic Diet Heart Healthy diet       Discharge Orders    Future Orders Complete By Expires     Call MD for:  persistant dizziness or light-headedness  As directed     Diet - low sodium heart healthy  As directed     Diet general  As directed     Increase activity slowly  As directed        Follow-up Information   Follow up with Dwan Bolt, MD. Schedule an appointment as soon as possible for a visit in 5 days.   Contact information:   [place] [address] [phone]       Total Time spent on discharge equals 45 minutes.  SignedOren Binet 05/14/2013 8:20 AM

## 2013-05-15 NOTE — Progress Notes (Signed)
Utilization review complete. Mihir Flanigan RN CCM Case Mgmt phone [phone] 

## 2013-05-19 DIAGNOSIS — I10 Essential (primary) hypertension: Secondary | ICD-10-CM | POA: Diagnosis not present

## 2013-05-19 DIAGNOSIS — E119 Type 2 diabetes mellitus without complications: Secondary | ICD-10-CM | POA: Diagnosis not present

## 2013-05-19 DIAGNOSIS — E162 Hypoglycemia, unspecified: Secondary | ICD-10-CM | POA: Diagnosis not present

## 2013-05-31 DIAGNOSIS — E119 Type 2 diabetes mellitus without complications: Secondary | ICD-10-CM | POA: Diagnosis not present

## 2013-06-02 DIAGNOSIS — E789 Disorder of lipoprotein metabolism, unspecified: Secondary | ICD-10-CM | POA: Diagnosis not present

## 2013-06-02 DIAGNOSIS — I10 Essential (primary) hypertension: Secondary | ICD-10-CM | POA: Diagnosis not present

## 2013-06-02 DIAGNOSIS — E119 Type 2 diabetes mellitus without complications: Secondary | ICD-10-CM | POA: Diagnosis not present

## 2013-06-19 DIAGNOSIS — E119 Type 2 diabetes mellitus without complications: Secondary | ICD-10-CM | POA: Diagnosis not present

## 2013-06-22 DIAGNOSIS — E789 Disorder of lipoprotein metabolism, unspecified: Secondary | ICD-10-CM | POA: Diagnosis not present

## 2013-06-22 DIAGNOSIS — I10 Essential (primary) hypertension: Secondary | ICD-10-CM | POA: Diagnosis not present

## 2013-06-22 DIAGNOSIS — E119 Type 2 diabetes mellitus without complications: Secondary | ICD-10-CM | POA: Diagnosis not present

## 2013-07-17 DIAGNOSIS — E119 Type 2 diabetes mellitus without complications: Secondary | ICD-10-CM | POA: Diagnosis not present

## 2013-07-24 DIAGNOSIS — I10 Essential (primary) hypertension: Secondary | ICD-10-CM | POA: Diagnosis not present

## 2013-07-24 DIAGNOSIS — E119 Type 2 diabetes mellitus without complications: Secondary | ICD-10-CM | POA: Diagnosis not present

## 2013-07-24 DIAGNOSIS — E789 Disorder of lipoprotein metabolism, unspecified: Secondary | ICD-10-CM | POA: Diagnosis not present

## 2013-08-17 DIAGNOSIS — E119 Type 2 diabetes mellitus without complications: Secondary | ICD-10-CM | POA: Diagnosis not present

## 2013-08-24 DIAGNOSIS — E789 Disorder of lipoprotein metabolism, unspecified: Secondary | ICD-10-CM | POA: Diagnosis not present

## 2013-08-24 DIAGNOSIS — E119 Type 2 diabetes mellitus without complications: Secondary | ICD-10-CM | POA: Diagnosis not present

## 2013-08-24 DIAGNOSIS — Z23 Encounter for immunization: Secondary | ICD-10-CM | POA: Diagnosis not present

## 2013-08-24 DIAGNOSIS — E0789 Other specified disorders of thyroid: Secondary | ICD-10-CM | POA: Diagnosis not present

## 2013-09-04 DIAGNOSIS — N390 Urinary tract infection, site not specified: Secondary | ICD-10-CM | POA: Diagnosis not present

## 2013-09-26 DIAGNOSIS — R82998 Other abnormal findings in urine: Secondary | ICD-10-CM | POA: Diagnosis not present

## 2013-09-26 DIAGNOSIS — R300 Dysuria: Secondary | ICD-10-CM | POA: Diagnosis not present

## 2013-10-17 DIAGNOSIS — E119 Type 2 diabetes mellitus without complications: Secondary | ICD-10-CM | POA: Diagnosis not present

## 2013-10-27 DIAGNOSIS — I10 Essential (primary) hypertension: Secondary | ICD-10-CM | POA: Diagnosis not present

## 2013-10-27 DIAGNOSIS — E789 Disorder of lipoprotein metabolism, unspecified: Secondary | ICD-10-CM | POA: Diagnosis not present

## 2013-10-27 DIAGNOSIS — E119 Type 2 diabetes mellitus without complications: Secondary | ICD-10-CM | POA: Diagnosis not present

## 2014-01-31 DIAGNOSIS — Z125 Encounter for screening for malignant neoplasm of prostate: Secondary | ICD-10-CM | POA: Diagnosis not present

## 2014-01-31 DIAGNOSIS — E789 Disorder of lipoprotein metabolism, unspecified: Secondary | ICD-10-CM | POA: Diagnosis not present

## 2014-01-31 DIAGNOSIS — I10 Essential (primary) hypertension: Secondary | ICD-10-CM | POA: Diagnosis not present

## 2014-01-31 DIAGNOSIS — Z79899 Other long term (current) drug therapy: Secondary | ICD-10-CM | POA: Diagnosis not present

## 2014-01-31 DIAGNOSIS — E0789 Other specified disorders of thyroid: Secondary | ICD-10-CM | POA: Diagnosis not present

## 2014-01-31 DIAGNOSIS — E119 Type 2 diabetes mellitus without complications: Secondary | ICD-10-CM | POA: Diagnosis not present

## 2014-02-07 DIAGNOSIS — I10 Essential (primary) hypertension: Secondary | ICD-10-CM | POA: Diagnosis not present

## 2014-02-07 DIAGNOSIS — E119 Type 2 diabetes mellitus without complications: Secondary | ICD-10-CM | POA: Diagnosis not present

## 2014-02-07 DIAGNOSIS — E789 Disorder of lipoprotein metabolism, unspecified: Secondary | ICD-10-CM | POA: Diagnosis not present

## 2014-02-07 DIAGNOSIS — E0789 Other specified disorders of thyroid: Secondary | ICD-10-CM | POA: Diagnosis not present

## 2014-03-06 DIAGNOSIS — H2510 Age-related nuclear cataract, unspecified eye: Secondary | ICD-10-CM | POA: Diagnosis not present

## 2014-03-06 DIAGNOSIS — E119 Type 2 diabetes mellitus without complications: Secondary | ICD-10-CM | POA: Diagnosis not present

## 2014-03-06 DIAGNOSIS — H524 Presbyopia: Secondary | ICD-10-CM | POA: Diagnosis not present

## 2014-03-06 DIAGNOSIS — H5200 Hypermetropia, unspecified eye: Secondary | ICD-10-CM | POA: Diagnosis not present

## 2014-04-04 DIAGNOSIS — E119 Type 2 diabetes mellitus without complications: Secondary | ICD-10-CM | POA: Diagnosis not present

## 2014-04-11 DIAGNOSIS — E119 Type 2 diabetes mellitus without complications: Secondary | ICD-10-CM | POA: Diagnosis not present

## 2014-04-26 DIAGNOSIS — E119 Type 2 diabetes mellitus without complications: Secondary | ICD-10-CM | POA: Diagnosis not present

## 2014-05-02 DIAGNOSIS — I10 Essential (primary) hypertension: Secondary | ICD-10-CM | POA: Diagnosis not present

## 2014-05-02 DIAGNOSIS — E789 Disorder of lipoprotein metabolism, unspecified: Secondary | ICD-10-CM | POA: Diagnosis not present

## 2014-05-02 DIAGNOSIS — E119 Type 2 diabetes mellitus without complications: Secondary | ICD-10-CM | POA: Diagnosis not present

## 2014-07-25 DIAGNOSIS — E119 Type 2 diabetes mellitus without complications: Secondary | ICD-10-CM | POA: Diagnosis not present

## 2014-08-01 DIAGNOSIS — E789 Disorder of lipoprotein metabolism, unspecified: Secondary | ICD-10-CM | POA: Diagnosis not present

## 2014-08-01 DIAGNOSIS — E119 Type 2 diabetes mellitus without complications: Secondary | ICD-10-CM | POA: Diagnosis not present

## 2014-08-01 DIAGNOSIS — I10 Essential (primary) hypertension: Secondary | ICD-10-CM | POA: Diagnosis not present

## 2014-11-07 DIAGNOSIS — E118 Type 2 diabetes mellitus with unspecified complications: Secondary | ICD-10-CM | POA: Diagnosis not present

## 2014-11-14 DIAGNOSIS — M19012 Primary osteoarthritis, left shoulder: Secondary | ICD-10-CM | POA: Diagnosis not present

## 2014-11-14 DIAGNOSIS — Z23 Encounter for immunization: Secondary | ICD-10-CM | POA: Diagnosis not present

## 2014-11-14 DIAGNOSIS — E789 Disorder of lipoprotein metabolism, unspecified: Secondary | ICD-10-CM | POA: Diagnosis not present

## 2014-11-14 DIAGNOSIS — I10 Essential (primary) hypertension: Secondary | ICD-10-CM | POA: Diagnosis not present

## 2014-11-14 DIAGNOSIS — M25512 Pain in left shoulder: Secondary | ICD-10-CM | POA: Diagnosis not present

## 2014-11-16 DIAGNOSIS — M25512 Pain in left shoulder: Secondary | ICD-10-CM | POA: Diagnosis not present

## 2015-02-13 DIAGNOSIS — E118 Type 2 diabetes mellitus with unspecified complications: Secondary | ICD-10-CM | POA: Diagnosis not present

## 2015-02-20 DIAGNOSIS — E118 Type 2 diabetes mellitus with unspecified complications: Secondary | ICD-10-CM | POA: Diagnosis not present

## 2015-02-20 DIAGNOSIS — Z23 Encounter for immunization: Secondary | ICD-10-CM | POA: Diagnosis not present

## 2015-02-20 DIAGNOSIS — I2510 Atherosclerotic heart disease of native coronary artery without angina pectoris: Secondary | ICD-10-CM | POA: Diagnosis not present

## 2015-02-20 DIAGNOSIS — E789 Disorder of lipoprotein metabolism, unspecified: Secondary | ICD-10-CM | POA: Diagnosis not present

## 2015-04-11 DIAGNOSIS — H2513 Age-related nuclear cataract, bilateral: Secondary | ICD-10-CM | POA: Diagnosis not present

## 2015-04-11 DIAGNOSIS — H25013 Cortical age-related cataract, bilateral: Secondary | ICD-10-CM | POA: Diagnosis not present

## 2015-04-11 DIAGNOSIS — E119 Type 2 diabetes mellitus without complications: Secondary | ICD-10-CM | POA: Diagnosis not present

## 2015-06-12 DIAGNOSIS — E118 Type 2 diabetes mellitus with unspecified complications: Secondary | ICD-10-CM | POA: Diagnosis not present

## 2015-06-14 ENCOUNTER — Encounter: Payer: Self-pay | Admitting: *Deleted

## 2015-06-19 DIAGNOSIS — E032 Hypothyroidism due to medicaments and other exogenous substances: Secondary | ICD-10-CM | POA: Diagnosis not present

## 2015-06-19 DIAGNOSIS — E789 Disorder of lipoprotein metabolism, unspecified: Secondary | ICD-10-CM | POA: Diagnosis not present

## 2015-06-19 DIAGNOSIS — E118 Type 2 diabetes mellitus with unspecified complications: Secondary | ICD-10-CM | POA: Diagnosis not present

## 2015-06-26 DIAGNOSIS — E119 Type 2 diabetes mellitus without complications: Secondary | ICD-10-CM | POA: Diagnosis not present

## 2015-06-26 DIAGNOSIS — I10 Essential (primary) hypertension: Secondary | ICD-10-CM | POA: Diagnosis not present

## 2015-06-26 DIAGNOSIS — E789 Disorder of lipoprotein metabolism, unspecified: Secondary | ICD-10-CM | POA: Diagnosis not present

## 2015-07-01 ENCOUNTER — Encounter: Payer: Self-pay | Admitting: Internal Medicine

## 2015-10-17 DIAGNOSIS — E032 Hypothyroidism due to medicaments and other exogenous substances: Secondary | ICD-10-CM | POA: Diagnosis not present

## 2015-10-17 DIAGNOSIS — E789 Disorder of lipoprotein metabolism, unspecified: Secondary | ICD-10-CM | POA: Diagnosis not present

## 2015-10-17 DIAGNOSIS — E782 Mixed hyperlipidemia: Secondary | ICD-10-CM | POA: Diagnosis not present

## 2015-10-17 DIAGNOSIS — E118 Type 2 diabetes mellitus with unspecified complications: Secondary | ICD-10-CM | POA: Diagnosis not present

## 2015-10-24 DIAGNOSIS — E789 Disorder of lipoprotein metabolism, unspecified: Secondary | ICD-10-CM | POA: Diagnosis not present

## 2015-10-24 DIAGNOSIS — I10 Essential (primary) hypertension: Secondary | ICD-10-CM | POA: Diagnosis not present

## 2015-10-24 DIAGNOSIS — Z23 Encounter for immunization: Secondary | ICD-10-CM | POA: Diagnosis not present

## 2015-10-24 DIAGNOSIS — E118 Type 2 diabetes mellitus with unspecified complications: Secondary | ICD-10-CM | POA: Diagnosis not present

## 2015-10-28 ENCOUNTER — Encounter: Payer: Self-pay | Admitting: Internal Medicine

## 2015-10-28 ENCOUNTER — Ambulatory Visit (INDEPENDENT_AMBULATORY_CARE_PROVIDER_SITE_OTHER): Payer: Medicare Other | Admitting: Internal Medicine

## 2015-10-28 VITALS — BP 158/80 | HR 56 | Ht 69.0 in | Wt 204.7 lb

## 2015-10-28 DIAGNOSIS — Z951 Presence of aortocoronary bypass graft: Secondary | ICD-10-CM | POA: Diagnosis not present

## 2015-10-28 DIAGNOSIS — I2583 Coronary atherosclerosis due to lipid rich plaque: Secondary | ICD-10-CM

## 2015-10-28 DIAGNOSIS — N182 Chronic kidney disease, stage 2 (mild): Secondary | ICD-10-CM

## 2015-10-28 DIAGNOSIS — E785 Hyperlipidemia, unspecified: Secondary | ICD-10-CM | POA: Diagnosis not present

## 2015-10-28 DIAGNOSIS — I10 Essential (primary) hypertension: Secondary | ICD-10-CM

## 2015-10-28 DIAGNOSIS — I2510 Atherosclerotic heart disease of native coronary artery without angina pectoris: Secondary | ICD-10-CM

## 2015-10-28 NOTE — Progress Notes (Signed)
OFFICE NOTE  Chief Complaint:  Routine follow-up  Primary Care Physician: Dwan Bolt, MD  HPI:  [name] is a pleasant gentleman who I previously saw in 2014. He has a history of coronary artery disease status post CABG in 1996. He also has chronic kidney disease with a creatinine that was around 1.8. In the past he seen Dr. Moshe Cipro, however she no longer sees him. He denies any chest pain or worsening shortness of breath. He reports good blood pressure control on his current medicines. He is somewhat active but does not exercise regularly. His primary care provider has been supplying his medications however we have prescribed him in the past for nadolol. He was noted in the past to have a right bundle branch block was which was new. His last stress test was in 2012 which was negative for ischemia.  PMHx:  Past Medical History  Diagnosis Date  . Diabetes mellitus   . Coronary artery disease   . Hypertension   . Chronic kidney disease     Past Surgical History  Procedure Laterality Date  . Coronary artery bypass graft    . Hernia repair    . Doppler echocardiography  2011  . Cardiac catheterization  1996    FAMHx:  Family History  Problem Relation Age of Onset  . Family history unknown: Yes    SOCHx:   reports that he has quit smoking. He does not have any smokeless tobacco history on file. He reports that he does not drink alcohol or use illicit drugs.  ALLERGIES:  No Known Allergies  ROS: A comprehensive review of systems was negative.  HOME MEDS: Current Outpatient Prescriptions  Medication Sig Dispense Refill  . amLODipine (NORVASC) 5 MG tablet Take 1 tablet by mouth daily.  0  . benazepril (LOTENSIN) 40 MG tablet Take 1 tablet by mouth daily.  0  . Choline Fenofibrate (TRILIPIX) 45 MG capsule Take 45 mg by mouth daily.    . cloNIDine (CATAPRES) 0.1 MG tablet Take 0.1 mg by mouth 2 (two) times daily.    Marland Kitchen ezetimibe (ZETIA) 10 MG tablet  Take 10 mg by mouth daily.    . furosemide (LASIX) 40 MG tablet Take 40 mg by mouth daily.    Marland Kitchen HUMALOG KWIKPEN 100 UNIT/ML KiwkPen Inject 10 Units into the skin daily.  0  . LEVEMIR 100 UNIT/ML injection Inject 34 Units into the skin 2 (two) times daily.  11  . nadolol (CORGARD) 40 MG tablet Take 40 mg by mouth daily.    . pioglitazone (ACTOS) 30 MG tablet Take 30 mg by mouth daily.    . rosuvastatin (CRESTOR) 20 MG tablet Take 20 mg by mouth daily.    Marland Kitchen SYNTHROID 100 MCG tablet Take 1 tablet by mouth daily.  0   No current facility-administered medications for this visit.    LABS/IMAGING: No results found for this or any previous visit (from the past 48 hour(s)). No results found.  WEIGHTS: Wt Readings from Last 3 Encounters:  10/28/15 204 lb 11.2 oz (92.851 kg)  05/14/13 217 lb 6 oz (98.6 kg)  01/16/12 221 lb 8 oz (100.472 kg)    VITALS: BP 158/80 mmHg  Pulse 56  Ht 5\' 9"  (1.753 m)  Wt 204 lb 11.2 oz (92.851 kg)  BMI 30.22 kg/m2  EXAM: General appearance: alert and no distress Neck: no carotid bruit, no JVD and thyroid not enlarged, symmetric, no tenderness/mass/nodules Lungs: clear to auscultation bilaterally Heart: regular  rate and rhythm, S1, S2 normal, no murmur, click, rub or gallop Abdomen: soft, non-tender; bowel sounds normal; no masses,  no organomegaly Extremities: extremities normal, atraumatic, no cyanosis or edema Pulses: 2+ and symmetric Skin: Skin color, texture, turgor normal. No rashes or lesions Neurologic: Grossly normal Psych: Pleasant  EKG: Sinus bradycardia 56, right bundle branch block, inferior infarct pattern   ASSESSMENT:  1. CAD status post CABG 1996 2. CKD with creatinine around 1.8 3. Hypertension 4. Dyslipidemia 5. RBBB  PLAN: 1.    [name] seems to be stable on current medications. Blood pressure is well-controlled. He is probably due for repeat lipid profile which is drawn from his primary care provider. We'll request those  records from Dr. Wilson Singer. He will be due for repeat stress test next year as it'll be 5 years since his previous stress test. Overall he seems to be doing fairly well. He reports that his chronic kidney disease was stable enough that his nephrologist that he can follow-up as needed. Plan to see him back annually or sooner as necessary.  Pixie Casino, MD, [facility] Attending Cardiologist [place] C Caci Orren 10/28/2015, 5:49 PM

## 2015-10-28 NOTE — Patient Instructions (Signed)
Your physician has requested that you have a lexiscan myoview in 1 year - prior to next office visit. For further information please visit [email or web address]. Please follow instruction sheet, as given.  Your physician wants you to follow-up in: 1 year with Dr. Debara Pickett. You will receive a reminder letter in the mail two months in advance. If you don't receive a letter, please call our office to schedule the follow-up appointment.

## 2016-02-21 DIAGNOSIS — E032 Hypothyroidism due to medicaments and other exogenous substances: Secondary | ICD-10-CM | POA: Diagnosis not present

## 2016-02-21 DIAGNOSIS — Z125 Encounter for screening for malignant neoplasm of prostate: Secondary | ICD-10-CM | POA: Diagnosis not present

## 2016-02-21 DIAGNOSIS — E118 Type 2 diabetes mellitus with unspecified complications: Secondary | ICD-10-CM | POA: Diagnosis not present

## 2016-02-21 DIAGNOSIS — E789 Disorder of lipoprotein metabolism, unspecified: Secondary | ICD-10-CM | POA: Diagnosis not present

## 2016-03-03 DIAGNOSIS — E118 Type 2 diabetes mellitus with unspecified complications: Secondary | ICD-10-CM | POA: Diagnosis not present

## 2016-03-03 DIAGNOSIS — E039 Hypothyroidism, unspecified: Secondary | ICD-10-CM | POA: Diagnosis not present

## 2016-03-03 DIAGNOSIS — I10 Essential (primary) hypertension: Secondary | ICD-10-CM | POA: Diagnosis not present

## 2016-05-14 DIAGNOSIS — H524 Presbyopia: Secondary | ICD-10-CM | POA: Diagnosis not present

## 2016-05-14 DIAGNOSIS — Z7984 Long term (current) use of oral hypoglycemic drugs: Secondary | ICD-10-CM | POA: Diagnosis not present

## 2016-05-14 DIAGNOSIS — H5203 Hypermetropia, bilateral: Secondary | ICD-10-CM | POA: Diagnosis not present

## 2016-05-14 DIAGNOSIS — E119 Type 2 diabetes mellitus without complications: Secondary | ICD-10-CM | POA: Diagnosis not present

## 2016-05-27 DIAGNOSIS — E789 Disorder of lipoprotein metabolism, unspecified: Secondary | ICD-10-CM | POA: Diagnosis not present

## 2016-05-27 DIAGNOSIS — E118 Type 2 diabetes mellitus with unspecified complications: Secondary | ICD-10-CM | POA: Diagnosis not present

## 2016-06-03 DIAGNOSIS — E789 Disorder of lipoprotein metabolism, unspecified: Secondary | ICD-10-CM | POA: Diagnosis not present

## 2016-06-03 DIAGNOSIS — E118 Type 2 diabetes mellitus with unspecified complications: Secondary | ICD-10-CM | POA: Diagnosis not present

## 2016-06-03 DIAGNOSIS — I10 Essential (primary) hypertension: Secondary | ICD-10-CM | POA: Diagnosis not present

## 2016-06-10 DIAGNOSIS — E119 Type 2 diabetes mellitus without complications: Secondary | ICD-10-CM | POA: Diagnosis not present

## 2016-06-10 DIAGNOSIS — H4321 Crystalline deposits in vitreous body, right eye: Secondary | ICD-10-CM | POA: Diagnosis not present

## 2016-06-10 DIAGNOSIS — H25813 Combined forms of age-related cataract, bilateral: Secondary | ICD-10-CM | POA: Diagnosis not present

## 2016-06-10 DIAGNOSIS — H353111 Nonexudative age-related macular degeneration, right eye, early dry stage: Secondary | ICD-10-CM | POA: Diagnosis not present

## 2016-07-06 DIAGNOSIS — H2512 Age-related nuclear cataract, left eye: Secondary | ICD-10-CM | POA: Diagnosis not present

## 2016-07-06 DIAGNOSIS — H25812 Combined forms of age-related cataract, left eye: Secondary | ICD-10-CM | POA: Diagnosis not present

## 2016-07-14 DIAGNOSIS — H2511 Age-related nuclear cataract, right eye: Secondary | ICD-10-CM | POA: Diagnosis not present

## 2016-07-20 DIAGNOSIS — H2511 Age-related nuclear cataract, right eye: Secondary | ICD-10-CM | POA: Diagnosis not present

## 2016-08-12 DIAGNOSIS — H5203 Hypermetropia, bilateral: Secondary | ICD-10-CM | POA: Diagnosis not present

## 2016-09-28 DIAGNOSIS — E032 Hypothyroidism due to medicaments and other exogenous substances: Secondary | ICD-10-CM | POA: Diagnosis not present

## 2016-09-28 DIAGNOSIS — E789 Disorder of lipoprotein metabolism, unspecified: Secondary | ICD-10-CM | POA: Diagnosis not present

## 2016-09-28 DIAGNOSIS — E118 Type 2 diabetes mellitus with unspecified complications: Secondary | ICD-10-CM | POA: Diagnosis not present

## 2016-10-06 DIAGNOSIS — Z23 Encounter for immunization: Secondary | ICD-10-CM | POA: Diagnosis not present

## 2016-10-06 DIAGNOSIS — E789 Disorder of lipoprotein metabolism, unspecified: Secondary | ICD-10-CM | POA: Diagnosis not present

## 2016-10-06 DIAGNOSIS — E118 Type 2 diabetes mellitus with unspecified complications: Secondary | ICD-10-CM | POA: Diagnosis not present

## 2016-10-06 DIAGNOSIS — I10 Essential (primary) hypertension: Secondary | ICD-10-CM | POA: Diagnosis not present

## 2016-11-18 ENCOUNTER — Emergency Department (HOSPITAL_COMMUNITY): Payer: Medicare Other

## 2016-11-18 ENCOUNTER — Inpatient Hospital Stay (HOSPITAL_COMMUNITY)
Admission: EM | Admit: 2016-11-18 | Discharge: 2016-11-20 | DRG: 684 | Disposition: A | Discharge status: Home / Self Care | Payer: Medicare Other | Attending: Internal Medicine | Admitting: Internal Medicine

## 2016-11-18 ENCOUNTER — Encounter (HOSPITAL_COMMUNITY): Payer: Self-pay | Admitting: Nurse Practitioner

## 2016-11-18 ENCOUNTER — Observation Stay (HOSPITAL_COMMUNITY): Payer: Medicare Other

## 2016-11-18 DIAGNOSIS — IMO0002 Reserved for concepts with insufficient information to code with codable children: Secondary | ICD-10-CM

## 2016-11-18 DIAGNOSIS — N289 Disorder of kidney and ureter, unspecified: Secondary | ICD-10-CM

## 2016-11-18 DIAGNOSIS — F1021 Alcohol dependence, in remission: Secondary | ICD-10-CM | POA: Diagnosis present

## 2016-11-18 DIAGNOSIS — A084 Viral intestinal infection, unspecified: Secondary | ICD-10-CM | POA: Diagnosis present

## 2016-11-18 DIAGNOSIS — E1122 Type 2 diabetes mellitus with diabetic chronic kidney disease: Secondary | ICD-10-CM | POA: Diagnosis present

## 2016-11-18 DIAGNOSIS — K828 Other specified diseases of gallbladder: Secondary | ICD-10-CM | POA: Diagnosis not present

## 2016-11-18 DIAGNOSIS — E119 Type 2 diabetes mellitus without complications: Secondary | ICD-10-CM

## 2016-11-18 DIAGNOSIS — N179 Acute kidney failure, unspecified: Secondary | ICD-10-CM | POA: Diagnosis not present

## 2016-11-18 DIAGNOSIS — I2510 Atherosclerotic heart disease of native coronary artery without angina pectoris: Secondary | ICD-10-CM | POA: Diagnosis present

## 2016-11-18 DIAGNOSIS — K529 Noninfective gastroenteritis and colitis, unspecified: Secondary | ICD-10-CM | POA: Diagnosis present

## 2016-11-18 DIAGNOSIS — R7401 Elevation of levels of liver transaminase levels: Secondary | ICD-10-CM | POA: Diagnosis present

## 2016-11-18 DIAGNOSIS — E860 Dehydration: Secondary | ICD-10-CM | POA: Diagnosis present

## 2016-11-18 DIAGNOSIS — R112 Nausea with vomiting, unspecified: Secondary | ICD-10-CM | POA: Diagnosis not present

## 2016-11-18 DIAGNOSIS — R229 Localized swelling, mass and lump, unspecified: Secondary | ICD-10-CM

## 2016-11-18 DIAGNOSIS — N2889 Other specified disorders of kidney and ureter: Secondary | ICD-10-CM | POA: Diagnosis not present

## 2016-11-18 DIAGNOSIS — K819 Cholecystitis, unspecified: Secondary | ICD-10-CM | POA: Diagnosis present

## 2016-11-18 DIAGNOSIS — Z794 Long term (current) use of insulin: Secondary | ICD-10-CM

## 2016-11-18 DIAGNOSIS — I10 Essential (primary) hypertension: Secondary | ICD-10-CM | POA: Diagnosis present

## 2016-11-18 DIAGNOSIS — R740 Nonspecific elevation of levels of transaminase and lactic acid dehydrogenase [LDH]: Secondary | ICD-10-CM | POA: Diagnosis present

## 2016-11-18 DIAGNOSIS — Z87891 Personal history of nicotine dependence: Secondary | ICD-10-CM

## 2016-11-18 DIAGNOSIS — R197 Diarrhea, unspecified: Secondary | ICD-10-CM | POA: Diagnosis not present

## 2016-11-18 DIAGNOSIS — Z833 Family history of diabetes mellitus: Secondary | ICD-10-CM

## 2016-11-18 DIAGNOSIS — I129 Hypertensive chronic kidney disease with stage 1 through stage 4 chronic kidney disease, or unspecified chronic kidney disease: Secondary | ICD-10-CM | POA: Diagnosis present

## 2016-11-18 DIAGNOSIS — Z951 Presence of aortocoronary bypass graft: Secondary | ICD-10-CM

## 2016-11-18 DIAGNOSIS — R509 Fever, unspecified: Secondary | ICD-10-CM | POA: Diagnosis not present

## 2016-11-18 DIAGNOSIS — N189 Chronic kidney disease, unspecified: Secondary | ICD-10-CM | POA: Diagnosis present

## 2016-11-18 DIAGNOSIS — Z79899 Other long term (current) drug therapy: Secondary | ICD-10-CM

## 2016-11-18 DIAGNOSIS — E785 Hyperlipidemia, unspecified: Secondary | ICD-10-CM | POA: Diagnosis present

## 2016-11-18 LAB — COMPREHENSIVE METABOLIC PANEL
ALT: 90 U/L — AB (ref 17–63)
ANION GAP: 10 (ref 5–15)
AST: 116 U/L — ABNORMAL HIGH (ref 15–41)
Albumin: 2.4 g/dL — ABNORMAL LOW (ref 3.5–5.0)
Alkaline Phosphatase: 212 U/L — ABNORMAL HIGH (ref 38–126)
BUN: 61 mg/dL — ABNORMAL HIGH (ref 6–20)
CALCIUM: 8.4 mg/dL — AB (ref 8.9–10.3)
CHLORIDE: 102 mmol/L (ref 101–111)
CO2: 24 mmol/L (ref 22–32)
CREATININE: 3.06 mg/dL — AB (ref 0.61–1.24)
GFR, EST AFRICAN AMERICAN: 20 mL/min — AB (ref >60)
GFR, EST NON AFRICAN AMERICAN: 18 mL/min — AB (ref >60)
Glucose, Bld: 167 mg/dL — ABNORMAL HIGH (ref 65–99)
Potassium: 3.4 mmol/L — ABNORMAL LOW (ref 3.5–5.1)
SODIUM: 136 mmol/L (ref 135–145)
Total Bilirubin: 1.5 mg/dL — ABNORMAL HIGH (ref 0.3–1.2)
Total Protein: 6.5 g/dL (ref 6.5–8.1)

## 2016-11-18 LAB — GLUCOSE, CAPILLARY: Glucose-Capillary: 158 mg/dL — ABNORMAL HIGH (ref 65–99)

## 2016-11-18 LAB — CBC
HCT: 41.2 % (ref 39.0–52.0)
HEMOGLOBIN: 14.2 g/dL (ref 13.0–17.0)
MCH: 26.8 pg (ref 26.0–34.0)
MCHC: 34.5 g/dL (ref 30.0–36.0)
MCV: 77.9 fL — AB (ref 78.0–100.0)
PLATELETS: 152 10*3/uL (ref 150–400)
RBC: 5.29 MIL/uL (ref 4.22–5.81)
RDW: 16.1 % — ABNORMAL HIGH (ref 11.5–15.5)
WBC: 8.6 10*3/uL (ref 4.0–10.5)

## 2016-11-18 LAB — I-STAT CG4 LACTIC ACID, ED: LACTIC ACID, VENOUS: 1.14 mmol/L (ref 0.5–1.9)

## 2016-11-18 LAB — CREATININE, SERUM
Creatinine, Ser: 2.87 mg/dL — ABNORMAL HIGH (ref 0.61–1.24)
GFR calc Af Amer: 22 mL/min — ABNORMAL LOW (ref >60)
GFR, EST NON AFRICAN AMERICAN: 19 mL/min — AB (ref >60)

## 2016-11-18 LAB — PROTIME-INR
INR: 1.26
Prothrombin Time: 15.9 seconds — ABNORMAL HIGH (ref 11.4–15.2)

## 2016-11-18 LAB — LIPASE, BLOOD: Lipase: 49 U/L (ref 11–51)

## 2016-11-18 MED ORDER — SODIUM CHLORIDE 0.9 % IV SOLN
INTRAVENOUS | Status: AC
  Administered 2016-11-18: 22:00:00 via INTRAVENOUS

## 2016-11-18 MED ORDER — ONDANSETRON HCL 4 MG/2ML IJ SOLN: 4.0000 mg | Freq: Four times a day (QID) | INTRAMUSCULAR | Status: DC | PRN

## 2016-11-18 MED ORDER — HYDROCODONE-ACETAMINOPHEN 5-325 MG PO TABS: 1.0000 | ORAL_TABLET | ORAL | Status: DC | PRN

## 2016-11-18 MED ORDER — CLONIDINE HCL 0.1 MG PO TABS
0.1000 mg | ORAL_TABLET | Freq: Two times a day (BID) | ORAL | Status: DC
  Administered 2016-11-19 – 2016-11-20 (×3): 0.1 mg via ORAL
  Filled 2016-11-18 (×3): qty 1

## 2016-11-18 MED ORDER — SENNOSIDES-DOCUSATE SODIUM 8.6-50 MG PO TABS: 1.0000 | ORAL_TABLET | Freq: Every evening | ORAL | Status: DC | PRN

## 2016-11-18 MED ORDER — ONDANSETRON HCL 4 MG PO TABS: 4.0000 mg | ORAL_TABLET | Freq: Four times a day (QID) | ORAL | Status: DC | PRN

## 2016-11-18 MED ORDER — ACETAMINOPHEN 650 MG RE SUPP: 650.0000 mg | Freq: Four times a day (QID) | RECTAL | Status: DC | PRN

## 2016-11-18 MED ORDER — INSULIN DETEMIR 100 UNIT/ML ~~LOC~~ SOLN
15.0000 [IU] | Freq: Every day | SUBCUTANEOUS | Status: DC
  Filled 2016-11-18 (×2): qty 0.15

## 2016-11-18 MED ORDER — INSULIN ASPART 100 UNIT/ML ~~LOC~~ SOLN
0.0000 [IU] | Freq: Three times a day (TID) | SUBCUTANEOUS | Status: DC
  Administered 2016-11-19: 3 [IU] via SUBCUTANEOUS
  Administered 2016-11-19: 2 [IU] via SUBCUTANEOUS
  Administered 2016-11-19: 5 [IU] via SUBCUTANEOUS

## 2016-11-18 MED ORDER — AMLODIPINE BESYLATE 5 MG PO TABS
5.0000 mg | ORAL_TABLET | Freq: Every day | ORAL | Status: DC
  Administered 2016-11-19 – 2016-11-20 (×2): 5 mg via ORAL
  Filled 2016-11-18 (×2): qty 1

## 2016-11-18 MED ORDER — INSULIN ASPART 100 UNIT/ML ~~LOC~~ SOLN
0.0000 [IU] | Freq: Every day | SUBCUTANEOUS | Status: DC
  Administered 2016-11-19: 2 [IU] via SUBCUTANEOUS

## 2016-11-18 MED ORDER — SODIUM CHLORIDE 0.9% FLUSH
3.0000 mL | Freq: Two times a day (BID) | INTRAVENOUS | Status: DC
  Administered 2016-11-18: 3 mL via INTRAVENOUS

## 2016-11-18 MED ORDER — SODIUM CHLORIDE 0.9 % IV BOLUS (SEPSIS)
1000.0000 mL | Freq: Once | INTRAVENOUS | Status: AC
  Administered 2016-11-18: 1000 mL via INTRAVENOUS

## 2016-11-18 MED ORDER — ACETAMINOPHEN 325 MG PO TABS: 650.0000 mg | ORAL_TABLET | Freq: Four times a day (QID) | ORAL | Status: DC | PRN

## 2016-11-18 MED ORDER — HEPARIN SODIUM (PORCINE) 5000 UNIT/ML IJ SOLN
5000.0000 [IU] | Freq: Three times a day (TID) | INTRAMUSCULAR | Status: DC
  Administered 2016-11-18 – 2016-11-20 (×6): 5000 [IU] via SUBCUTANEOUS
  Filled 2016-11-18 (×5): qty 1

## 2016-11-18 NOTE — ED Provider Notes (Addendum)
[facility] DEPT Provider Note   CSN: [id number] Arrival date & time: 11/18/16  1247     History   Chief Complaint Chief Complaint  Patient presents with  . GI Problem    HPI [name] is a 82 y.o. male.  HPI  Has been constant. Decreased appetite. Has had some fevers and some diarrhea also. No pain. No sick contacts. No headache. States he does feel bad all over. No abdominal pain.No blood in stool or emesis.   Past Medical History:  Diagnosis Date  . Chronic kidney disease   . Coronary artery disease   . Diabetes mellitus   . Hypertension     Patient Active Problem List   Diagnosis Date Noted  . Dyslipidemia 05/12/2013  . Syncope 01/14/2012  . Hypoglycemia 01/14/2012  . CKD (chronic kidney disease) 01/14/2012  . CAD (coronary artery disease) 01/14/2012  . HTN (hypertension) 01/14/2012    Past Surgical History:  Procedure Laterality Date  . CARDIAC CATHETERIZATION  1996  . CORONARY ARTERY BYPASS GRAFT    . DOPPLER ECHOCARDIOGRAPHY  2011  . HERNIA REPAIR         Home Medications    Prior to Admission medications   Medication Sig Start Date End Date Taking? Authorizing Provider  amLODipine (NORVASC) 5 MG tablet Take 1 tablet by mouth daily. 10/04/15   Historical Provider, MD  benazepril (LOTENSIN) 40 MG tablet Take 1 tablet by mouth daily. 10/04/15   Historical Provider, MD  Choline Fenofibrate (TRILIPIX) 45 MG capsule Take 45 mg by mouth daily.    Historical Provider, MD  cloNIDine (CATAPRES) 0.1 MG tablet Take 0.1 mg by mouth 2 (two) times daily.    Historical Provider, MD  ezetimibe (ZETIA) 10 MG tablet Take 10 mg by mouth daily.    Historical Provider, MD  furosemide (LASIX) 40 MG tablet Take 40 mg by mouth daily.    Historical Provider, MD  HUMALOG KWIKPEN 100 UNIT/ML KiwkPen Inject 10 Units into the skin daily. 07/25/15   Historical Provider, MD  LEVEMIR 100 UNIT/ML injection Inject 34 Units into the skin 2 (two) times daily. 10/17/15    Historical Provider, MD  nadolol (CORGARD) 40 MG tablet Take 40 mg by mouth daily.    Historical Provider, MD  pioglitazone (ACTOS) 30 MG tablet Take 30 mg by mouth daily.    Historical Provider, MD  rosuvastatin (CRESTOR) 20 MG tablet Take 20 mg by mouth daily.    Historical Provider, MD  SYNTHROID 100 MCG tablet Take 1 tablet by mouth daily. 10/17/15   Historical Provider, MD    Family History Family History  Problem Relation Age of Onset  . Family history unknown: Yes    Social History Social History  Substance Use Topics  . Smoking status: Former Research scientist (life sciences)  . Smokeless tobacco: Not on file  . Alcohol use No     Allergies   Patient has no known allergies.   Review of Systems Review of Systems  Constitutional: Positive for appetite change and fever.  HENT: Negative for dental problem.   Respiratory: Negative for shortness of breath.   Cardiovascular: Negative for chest pain.  Gastrointestinal: Positive for abdominal pain, diarrhea, nausea and vomiting.  Genitourinary: Negative for enuresis and flank pain.  Musculoskeletal: Negative for back pain.  Neurological: Negative for headaches.  Hematological: Negative for adenopathy.  Psychiatric/Behavioral: Negative for confusion.     Physical Exam Updated Vital Signs BP 132/66 (BP Location: Left Arm)   Pulse 64   Temp  102.4 F (39.1 C) (Oral)   Resp 19   SpO2 96%   Physical Exam  Constitutional: He appears well-developed.  HENT:  Head: Atraumatic.  Neck: Neck supple.  Cardiovascular: Normal rate.   Pulmonary/Chest: Effort normal.  Abdominal: He exhibits no mass. There is no tenderness. No hernia.  Musculoskeletal: Normal range of motion.  Neurological: He is alert.  Skin: Skin is warm. Capillary refill takes less than 2 seconds.  Psychiatric: He has a normal mood and affect.     ED Treatments / Results  Labs (all labs ordered are listed, but only abnormal results are displayed) Labs Reviewed  COMPREHENSIVE  METABOLIC PANEL - Abnormal; Notable for the following:       Result Value   Potassium 3.4 (*)    Glucose, Bld 167 (*)    BUN 61 (*)    Creatinine, Ser 3.06 (*)    Calcium 8.4 (*)    Albumin 2.4 (*)    AST 116 (*)    ALT 90 (*)    Alkaline Phosphatase 212 (*)    Total Bilirubin 1.5 (*)    GFR calc non Af Amer 18 (*)    GFR calc Af Amer 20 (*)    All other components within normal limits  CBC - Abnormal; Notable for the following:    MCV 77.9 (*)    RDW 16.1 (*)    All other components within normal limits  LIPASE, BLOOD  URINALYSIS, ROUTINE W REFLEX MICROSCOPIC  I-STAT CG4 LACTIC ACID, ED  I-STAT CG4 LACTIC ACID, ED    EKG  EKG Interpretation None       Radiology US Abdomen Complete  Result Date: 11/18/2016 CLINICAL DATA:  Abdominal pain and vomiting for 1 week. Chronic kidney disease. EXAM: ABDOMEN ULTRASOUND COMPLETE COMPARISON:  None. FINDINGS: Gallbladder: The gallbladder wall is slightly thickened and edematous with cystic changes in the gallbladder there are. Small amount of ascites around the gallbladder. No stones. Common bile duct: Diameter: 5.4 mm, normal. Liver: No focal lesion identified. Within normal limits in parenchymal echogenicity. IVC: No abnormality visualized. Pancreas: The visualized portion is normal. Spleen: Slightly prominent at 14.4 cm in length. Volume is 621 cubic cm. Right Kidney: Length: 12.1 cm there is. Abnormal increased echogenicity consistent with renal medical disease. Exophytic solid mass extending from the upper pole measuring 3.8 x 3.7 x 3.5 cm. Left Kidney: Length: 11.8 cm. Increased echogenicity of the renal parenchyma consistent with renal medical disease. 17 mm cyst in the upper pole, 2.2 cm cyst in the mid kidney and 2.3 cm slightly complex cyst in the lower pole. Abdominal aorta: No aneurysm visualized. Other findings: None. IMPRESSION: 1. Abnormal echogenicity of the renal parenchyma bilaterally consistent with renal medical disease. 2.  3.8 cm solid exophytic mass on the upper pole of the right kidney worrisome for malignancy. MRI of the abdomen is recommended further assessment of this renal mass. 3. Findings consistent with chronic cholecystitis without cholelithiasis. Electronically Signed   By: Lorriane Shire M.D.   On: 11/18/2016 15:43   Dg Abdomen Acute W/chest  Result Date: 11/18/2016 CLINICAL DATA:  Vomiting, fever for week, right-sided over abdomen pain EXAM: DG ABDOMEN ACUTE W/ 1V CHEST COMPARISON:  Chest x-ray of 05/12/2013 FINDINGS: Moderate cardiomegaly is stable. No pneumonia or effusion is seen. Median sternotomy sutures are noted from prior CABG. Supine and erect views of the abdomen show no bowel obstruction. No radiographic evidence of constipation is seen. No free air is seen on the erect  view. No opaque calculi are noted. IMPRESSION: 1. Stable cardiomegaly.  No active lung disease. 2. No bowel obstruction.  No free air. Electronically Signed   By: Ivar Drape M.D.   On: 11/18/2016 14:35    Procedures Procedures (including critical care time)  Medications Ordered in ED Medications  sodium chloride 0.9 % bolus 1,000 mL (not administered)     Initial Impression / Assessment and Plan / ED Course  I have reviewed the triage vital signs and the nursing notes.  Pertinent labs & imaging results that were available during my care of the patient were reviewed by me and considered in my medical decision making (see chart for details).  Clinical Course     patientm present with NVD and worsened renal function. Normal lipase but LFTs elevated. AAS reassuring. Urine and [place] pending. Will require admission. Non-tender.  Ultrasound has returned and showed exophytic renal mass and possible chronic acalculous cholecystitis. He still has no right upper quadrant tenderness. With the elevated creatinine would benefit from admission and may need further evaluation of the liver function. With the nausea vomiting diarrhea the  fever could be related to an infection such as gastroenteritis. Final Clinical Impressions(s) / ED Diagnoses   Final diagnoses:  Nausea vomiting and diarrhea  Renal insufficiency  Renal mass    New Prescriptions New Prescriptions   No medications on file     Davonna Belling, MD 11/18/16 [place], MD 11/18/16 [phone]

## 2016-11-18 NOTE — ED Triage Notes (Signed)
Pt presents with c/o nausea and vomiting. The symptoms began last Wednesday. The symptoms have been persistent since onset and have not improved.He has not been able to tolerate adequate oral intake. He reports fevers, diarrhea. He denies pain.

## 2016-11-18 NOTE — H&P (Signed)
History and Physical    [name] EHM:[id number] DOB: [birth year] DOA: 11/18/2016  PCP: Dwan Bolt, MD Patient coming from: home  Chief Complaint: nausea/vomiting/dehydration  HPI: [name] is a 82 y.o. male with medical history significant hypertension, diabetes, coronary artery disease and chronic kidney disease presents to the emergency Department chief complaint 1-week history of nausea and vomiting.  Information is obtained from the patient and his wife who is at the bedside. He states for the last week he has suffered intermittent nausea and vomiting and decreased oral intake. He states initially he had loose stool but that has resolved. He denies any abdominal pain or distention, headache syncope or near-syncope. He does report fever and generalized weakness. Wife states he's had absolutely "no energy" for the last 4 days. He denies dysuria hematuria frequency or urgency but does say his urine is concentrated. He denies lower extremity edema shortness of breath cough orthopnea. He denies bright red blood per rectum melena.    ED Course: The emergency department he has a temperature of 102 he is hemodynamically stable and not hypoxic. He appears acutely ill but nontoxic. He is provided with IV fluids.  Review of Systems: As per HPI otherwise 10 point review of systems negative.   Ambulatory Status: ambulates with cane. Independent with ADL's  Past Medical History:  Diagnosis Date  . Chronic kidney disease   . Coronary artery disease   . Diabetes mellitus   . Hypertension     Past Surgical History:  Procedure Laterality Date  . CARDIAC CATHETERIZATION  1996  . CORONARY ARTERY BYPASS GRAFT    . DOPPLER ECHOCARDIOGRAPHY  2011  . HERNIA REPAIR      Social History   Social History  . Marital status: Married    Spouse name: N/A  . Number of children: N/A  . Years of education: N/A   Occupational History  . Not on file.   Social History Main Topics  .  Smoking status: Former Research scientist (life sciences)  . Smokeless tobacco: Not on file  . Alcohol use No  . Drug use: No  . Sexual activity: Not on file   Other Topics Concern  . Not on file   Social History Narrative  . No narrative on file   Lives with his wife No Known Allergies  Family History  Problem Relation Age of Onset  . Diabetes Mother   . Diabetes Sister     Prior to Admission medications   Medication Sig Start Date End Date Taking? Authorizing Provider  amLODipine (NORVASC) 5 MG tablet Take 5 mg by mouth daily.  10/04/15  Yes Historical Provider, MD  benazepril (LOTENSIN) 40 MG tablet Take 40 mg by mouth daily.  10/04/15  Yes Historical Provider, MD  Choline Fenofibrate (TRILIPIX) 45 MG capsule Take 45 mg by mouth daily.   Yes Historical Provider, MD  cloNIDine (CATAPRES) 0.1 MG tablet Take 0.1 mg by mouth 2 (two) times daily.   Yes Historical Provider, MD  ezetimibe (ZETIA) 10 MG tablet Take 10 mg by mouth daily.   Yes Historical Provider, MD  furosemide (LASIX) 40 MG tablet Take 40 mg by mouth daily.   Yes Historical Provider, MD  HUMALOG KWIKPEN 100 UNIT/ML KiwkPen Inject 10 Units into the skin daily. 07/25/15  Yes Historical Provider, MD  LEVEMIR 100 UNIT/ML injection Inject 30 Units into the skin 2 (two) times daily.  10/17/15  Yes Historical Provider, MD  nadolol (CORGARD) 40 MG tablet Take 40 mg by mouth  daily.   Yes Historical Provider, MD  rosuvastatin (CRESTOR) 20 MG tablet Take 20 mg by mouth daily.   Yes Historical Provider, MD  SYNTHROID 100 MCG tablet Take 100 mcg by mouth daily.  10/17/15  Yes Historical Provider, MD    Physical Exam: Vitals:   11/18/16 1254  BP: 132/66  Pulse: 64  Resp: 19  Temp: 102.4 F (39.1 C)  TempSrc: Oral  SpO2: 96%     General:  Appears calm  and comfortable Eyes:  PERRL, EOMI, normal lids, iris ENT:  grossly normal hearing, lips & tongue, His membranes of her mouth are pink slightly dry Neck:  no LAD, masses or  thyromegaly Cardiovascular:  RRR, no m/r/g. No LE edema.  Respiratory:  CTA bilaterally, no w/r/r. Normal respiratory effort. Abdomen:  soft, ntnd, positive bowel sounds no guarding or rebounding Skin:  no rash or induration seen on limited exam Musculoskeletal:  grossly normal tone BUE/BLE, good ROM, no bony abnormality Psychiatric:  grossly normal mood and affect, speech fluent and appropriate, AOx3 Neurologic:  CN 2-12 grossly intact, moves all extremities in coordinated fashion, sensation intact speech clear facial symmetry  Labs on Admission: I have personally reviewed following labs and imaging studies  CBC:  Recent Labs Lab 11/18/16 1301  WBC 8.6  HGB 14.2  HCT 41.2  MCV 77.9*  PLT 809   Basic Metabolic Panel:  Recent Labs Lab 11/18/16 1301  NA 136  K 3.4*  CL 102  CO2 24  GLUCOSE 167*  BUN 61*  CREATININE 3.06*  CALCIUM 8.4*   GFR: CrCl cannot be calculated (Unknown ideal weight.). Liver Function Tests:  Recent Labs Lab 11/18/16 1301  AST 116*  ALT 90*  ALKPHOS 212*  BILITOT 1.5*  PROT 6.5  ALBUMIN 2.4*    Recent Labs Lab 11/18/16 1301  LIPASE 49   No results for input(s): AMMONIA in the last 168 hours. Coagulation Profile: No results for input(s): INR, PROTIME in the last 168 hours. Cardiac Enzymes: No results for input(s): CKTOTAL, CKMB, CKMBINDEX, TROPONINI in the last 168 hours. BNP (last 3 results) No results for input(s): PROBNP in the last 8760 hours. HbA1C: No results for input(s): HGBA1C in the last 72 hours. CBG: No results for input(s): GLUCAP in the last 168 hours. Lipid Profile: No results for input(s): CHOL, HDL, LDLCALC, TRIG, CHOLHDL, LDLDIRECT in the last 72 hours. Thyroid Function Tests: No results for input(s): TSH, T4TOTAL, FREET4, T3FREE, THYROIDAB in the last 72 hours. Anemia Panel: No results for input(s): VITAMINB12, FOLATE, FERRITIN, TIBC, IRON, RETICCTPCT in the last 72 hours. Urine analysis:    Component  Value Date/Time   COLORURINE YELLOW 05/12/2013 1543   APPEARANCEUR CLEAR 05/12/2013 1543   LABSPEC 1.010 05/12/2013 1543   PHURINE 6.5 05/12/2013 1543   GLUCOSEU 100 (A) 05/12/2013 1543   HGBUR TRACE (A) 05/12/2013 1543   BILIRUBINUR NEGATIVE 05/12/2013 1543   KETONESUR NEGATIVE 05/12/2013 1543   PROTEINUR NEGATIVE 05/12/2013 1543   UROBILINOGEN 0.2 05/12/2013 1543   NITRITE NEGATIVE 05/12/2013 1543   LEUKOCYTESUR NEGATIVE 05/12/2013 1543    Creatinine Clearance: CrCl cannot be calculated (Unknown ideal weight.).  Sepsis Labs: @LABRCNTIP (procalcitonin:4,lacticidven:4) )No results found for this or any previous visit (from the past 240 hour(s)).   Radiological Exams on Admission: US Abdomen Complete  Result Date: 11/18/2016 CLINICAL DATA:  Abdominal pain and vomiting for 1 week. Chronic kidney disease. EXAM: ABDOMEN ULTRASOUND COMPLETE COMPARISON:  None. FINDINGS: Gallbladder: The gallbladder wall is slightly thickened and edematous with  cystic changes in the gallbladder there are. Small amount of ascites around the gallbladder. No stones. Common bile duct: Diameter: 5.4 mm, normal. Liver: No focal lesion identified. Within normal limits in parenchymal echogenicity. IVC: No abnormality visualized. Pancreas: The visualized portion is normal. Spleen: Slightly prominent at 14.4 cm in length. Volume is 621 cubic cm. Right Kidney: Length: 12.1 cm there is. Abnormal increased echogenicity consistent with renal medical disease. Exophytic solid mass extending from the upper pole measuring 3.8 x 3.7 x 3.5 cm. Left Kidney: Length: 11.8 cm. Increased echogenicity of the renal parenchyma consistent with renal medical disease. 17 mm cyst in the upper pole, 2.2 cm cyst in the mid kidney and 2.3 cm slightly complex cyst in the lower pole. Abdominal aorta: No aneurysm visualized. Other findings: None. IMPRESSION: 1. Abnormal echogenicity of the renal parenchyma bilaterally consistent with renal medical  disease. 2. 3.8 cm solid exophytic mass on the upper pole of the right kidney worrisome for malignancy. MRI of the abdomen is recommended further assessment of this renal mass. 3. Findings consistent with chronic cholecystitis without cholelithiasis. Electronically Signed   By: Lorriane Shire M.D.   On: 11/18/2016 15:43   Dg Abdomen Acute W/chest  Result Date: 11/18/2016 CLINICAL DATA:  Vomiting, fever for week, right-sided over abdomen pain EXAM: DG ABDOMEN ACUTE W/ 1V CHEST COMPARISON:  Chest x-ray of 05/12/2013 FINDINGS: Moderate cardiomegaly is stable. No pneumonia or effusion is seen. Median sternotomy sutures are noted from prior CABG. Supine and erect views of the abdomen show no bowel obstruction. No radiographic evidence of constipation is seen. No free air is seen on the erect view. No opaque calculi are noted. IMPRESSION: 1. Stable cardiomegaly.  No active lung disease. 2. No bowel obstruction.  No free air. Electronically Signed   By: Ivar Drape M.D.   On: 11/18/2016 14:35    EKG:   Assessment/Plan Principal Problem:   Acute kidney injury superimposed on chronic kidney disease ([place]) Active Problems:   CAD (coronary artery disease)   HTN (hypertension)   Nausea vomiting and diarrhea   Renal mass   Gastroenteritis   Cholecystitis   Diabetes (HCC)   Elevated transaminase level   #1. Acute kidney injury superimposed on chronic kidney disease. Creatinine 3.06. History of diabetes hypertension. Chart review indicates creatinine 1.5 but that is 3 years ago. Potassium 3.4. Renal ultrasound consistent with renal medical disease and reveals a mass worrisome for malignancy -Admit -Gentle IV fluids -Hold nephrotoxins -Monitor urine output -Obtain an MRI -Recheck in the morning  #2. Renal mass. Ultra sound as noted above.  -See above -MRI -Depending on results of MRI may need interventional radiology for biopsy  #3. Gastroenteritis. Lipase within the limits of normal. Lactic acid  within the limits of normal. Abdomen nontender. reports no diarrhea for the last 5 days -Clear liquids -IV fluids -influenza panel -supportive therapy  #4. Diabetes. Serum glucose 167. Home medications include Levemir and Humalog. -We'll continue Levemir at half of his home dose -Sliding scale insulin for optimal control -We'll obtain a hemoglobin A1c as well  #5. Hypertension. Fair control in the emergency department. Home medications include amlodipine, benazaoril, clonidine, Lasix, nadolol -hold lasix and benazepril for now -hold BB for now as HR 54 -Continue clonidine and Norvasc starting tomorow -Monitor closely and resume other home meds as indicated  6. Elevated transaminase. AST 116, ALT 90, alkaline phosphatase 212 total bilirubin 1.5. Former EtOH abuser. No EtOH since 1998. -INR -hepatitis panel  #7. CAD. Patient  denies chest pain. -Continue aspirin -Hold beta blocker for now -continue statin   DVT prophylaxis: heparin  Code Status: full  Family Communication: wife and daughter at bedside  Disposition Plan: home   Consults called: none  Admission status: obs    Dyanne Carrel M MD [facility]  If 7PM-7AM, please contact night-coverage [email or web address] Password [email or web address]  11/18/2016, 5:11 PM

## 2016-11-19 DIAGNOSIS — E860 Dehydration: Secondary | ICD-10-CM | POA: Diagnosis present

## 2016-11-19 DIAGNOSIS — I129 Hypertensive chronic kidney disease with stage 1 through stage 4 chronic kidney disease, or unspecified chronic kidney disease: Secondary | ICD-10-CM | POA: Diagnosis present

## 2016-11-19 DIAGNOSIS — E785 Hyperlipidemia, unspecified: Secondary | ICD-10-CM | POA: Diagnosis present

## 2016-11-19 DIAGNOSIS — N179 Acute kidney failure, unspecified: Secondary | ICD-10-CM | POA: Diagnosis not present

## 2016-11-19 DIAGNOSIS — K819 Cholecystitis, unspecified: Secondary | ICD-10-CM | POA: Diagnosis present

## 2016-11-19 DIAGNOSIS — N2889 Other specified disorders of kidney and ureter: Secondary | ICD-10-CM | POA: Diagnosis present

## 2016-11-19 DIAGNOSIS — Z951 Presence of aortocoronary bypass graft: Secondary | ICD-10-CM | POA: Diagnosis not present

## 2016-11-19 DIAGNOSIS — I2510 Atherosclerotic heart disease of native coronary artery without angina pectoris: Secondary | ICD-10-CM

## 2016-11-19 DIAGNOSIS — A084 Viral intestinal infection, unspecified: Secondary | ICD-10-CM | POA: Diagnosis present

## 2016-11-19 DIAGNOSIS — Z794 Long term (current) use of insulin: Secondary | ICD-10-CM | POA: Diagnosis not present

## 2016-11-19 DIAGNOSIS — F1021 Alcohol dependence, in remission: Secondary | ICD-10-CM | POA: Diagnosis present

## 2016-11-19 DIAGNOSIS — I2583 Coronary atherosclerosis due to lipid rich plaque: Secondary | ICD-10-CM

## 2016-11-19 DIAGNOSIS — K529 Noninfective gastroenteritis and colitis, unspecified: Secondary | ICD-10-CM

## 2016-11-19 DIAGNOSIS — R197 Diarrhea, unspecified: Secondary | ICD-10-CM

## 2016-11-19 DIAGNOSIS — R740 Nonspecific elevation of levels of transaminase and lactic acid dehydrogenase [LDH]: Secondary | ICD-10-CM | POA: Diagnosis not present

## 2016-11-19 DIAGNOSIS — Z79899 Other long term (current) drug therapy: Secondary | ICD-10-CM | POA: Diagnosis not present

## 2016-11-19 DIAGNOSIS — Z833 Family history of diabetes mellitus: Secondary | ICD-10-CM | POA: Diagnosis not present

## 2016-11-19 DIAGNOSIS — Z87891 Personal history of nicotine dependence: Secondary | ICD-10-CM | POA: Diagnosis not present

## 2016-11-19 DIAGNOSIS — N189 Chronic kidney disease, unspecified: Secondary | ICD-10-CM

## 2016-11-19 DIAGNOSIS — R112 Nausea with vomiting, unspecified: Secondary | ICD-10-CM

## 2016-11-19 DIAGNOSIS — E1122 Type 2 diabetes mellitus with diabetic chronic kidney disease: Secondary | ICD-10-CM | POA: Diagnosis not present

## 2016-11-19 DIAGNOSIS — I10 Essential (primary) hypertension: Secondary | ICD-10-CM

## 2016-11-19 LAB — URINALYSIS, ROUTINE W REFLEX MICROSCOPIC
Bacteria, UA: NONE SEEN
Bilirubin Urine: NEGATIVE
GLUCOSE, UA: NEGATIVE mg/dL
KETONES UR: 5 mg/dL — AB
Leukocytes, UA: NEGATIVE
Nitrite: NEGATIVE
PH: 5 (ref 5.0–8.0)
Protein, ur: 30 mg/dL — AB
SPECIFIC GRAVITY, URINE: 1.015 (ref 1.005–1.030)
SQUAMOUS EPITHELIAL / LPF: NONE SEEN

## 2016-11-19 LAB — BASIC METABOLIC PANEL
ANION GAP: 11 (ref 5–15)
BUN: 59 mg/dL — AB (ref 6–20)
CHLORIDE: 106 mmol/L (ref 101–111)
CO2: 22 mmol/L (ref 22–32)
Calcium: 7.9 mg/dL — ABNORMAL LOW (ref 8.9–10.3)
Creatinine, Ser: 2.41 mg/dL — ABNORMAL HIGH (ref 0.61–1.24)
GFR, EST AFRICAN AMERICAN: 27 mL/min — AB (ref >60)
GFR, EST NON AFRICAN AMERICAN: 23 mL/min — AB (ref >60)
Glucose, Bld: 169 mg/dL — ABNORMAL HIGH (ref 65–99)
POTASSIUM: 3.6 mmol/L (ref 3.5–5.1)
SODIUM: 139 mmol/L (ref 135–145)

## 2016-11-19 LAB — HEPATITIS PANEL, ACUTE
HCV AB: 0.1 {s_co_ratio} (ref 0.0–0.9)
HEP B S AG: NEGATIVE
Hep A IgM: NEGATIVE
Hep B C IgM: NEGATIVE

## 2016-11-19 LAB — CBC
HEMATOCRIT: 38.3 % — AB (ref 39.0–52.0)
Hemoglobin: 13 g/dL (ref 13.0–17.0)
MCH: 26.4 pg (ref 26.0–34.0)
MCHC: 33.9 g/dL (ref 30.0–36.0)
MCV: 77.8 fL — AB (ref 78.0–100.0)
Platelets: 167 10*3/uL (ref 150–400)
RBC: 4.92 MIL/uL (ref 4.22–5.81)
RDW: 15.9 % — AB (ref 11.5–15.5)
WBC: 7.1 10*3/uL (ref 4.0–10.5)

## 2016-11-19 LAB — GLUCOSE, CAPILLARY
GLUCOSE-CAPILLARY: 157 mg/dL — AB (ref 65–99)
Glucose-Capillary: 235 mg/dL — ABNORMAL HIGH (ref 65–99)
Glucose-Capillary: 284 mg/dL — ABNORMAL HIGH (ref 65–99)
Glucose-Capillary: 215 mg/dL — ABNORMAL HIGH (ref 65–99)

## 2016-11-19 LAB — HEMOGLOBIN A1C
Hgb A1c MFr Bld: 8.4 % — ABNORMAL HIGH (ref 4.8–5.6)
Mean Plasma Glucose: 194 mg/dL

## 2016-11-19 LAB — INFLUENZA PANEL BY PCR (TYPE A & B)
INFLBPCR: NEGATIVE
Influenza A By PCR: NEGATIVE

## 2016-11-19 MED ORDER — INSULIN DETEMIR 100 UNIT/ML ~~LOC~~ SOLN: 30.0000 [IU] | Freq: Every day | SUBCUTANEOUS | Status: DC

## 2016-11-19 MED ORDER — INSULIN DETEMIR 100 UNIT/ML ~~LOC~~ SOLN
15.0000 [IU] | Freq: Two times a day (BID) | SUBCUTANEOUS | Status: DC
  Administered 2016-11-19 – 2016-11-20 (×3): 15 [IU] via SUBCUTANEOUS
  Filled 2016-11-19 (×4): qty 0.15

## 2016-11-19 MED ORDER — SODIUM CHLORIDE 0.9 % IV SOLN
INTRAVENOUS | Status: AC
  Administered 2016-11-19 (×2): via INTRAVENOUS

## 2016-11-19 NOTE — Progress Notes (Signed)
Progress Note    PERCY WINTERROWD  MCN:[id number] DOB: [birth year]  DOA: 11/18/2016 PCP: Dwan Bolt, MD    Brief Narrative:   Chief complaint: Follow-up gastroenteritis  KINO DUNSWORTH is an 82 y.o. male with medical history significant hypertension, diabetes, coronary artery disease and chronic kidney disease who was admitted 11/18/16 with a one-week history of nausea/vomiting. Upon initial presentation, he had a fever of 102 but was otherwise hemodynamically stable. Creatinine was elevated at 3.06.   Assessment/Plan:   Principal Problem:   Acute kidney injury superimposed on chronic kidney disease ([place]) secondary to dehydration in the setting of probable viral gastroenteritis with nausea, vomiting and diarrhea Patient was admitted and provided with gentle IV fluids. Nephrotoxic medications were held. Plain films were negative for bowel obstruction. Baseline creatinine 3 years ago was 1.5. Renal ultrasound consistent with medical renal disease but did show an incidental renal mass on the right. Continue supportive care with IV fluids and antinausea medications. Lipase not elevated. Lactic acid WNL. Influenza panel negative. Acute hepatitis panel negative.  Active Problems:   CAD (coronary artery disease) Continue aspirin. Continue statin. Beta blocker currently on hold.    HTN (hypertension) On multiple antihypertensives. Lasix, benazepril, and nadolol on hold. Continue clonidine and Norvasc.    Renal mass MRI of the abdomen shows a 4 cm right upper pole renal mass that is indeterminant. Options include close follow-up versus outpatient biopsy. Discussion held with the patient and his wife, opts for close outpatient follow up.    Diabetes ([place]) Currently being managed with Levemir 15 units daily, and insulin sensitive SSI. Hemoglobin A1c is 8.4%. Monitor glycemic control.    Elevated transaminase level AST 116, ALT 90, alkaline phosphatase 212 total bilirubin 1.5. Former  EtOH abuser. No EtOH since 1998.   Family Communication/Anticipated D/C date and plan/Code Status   DVT prophylaxis: Heparin ordered. Code Status: Full Code.  Family Communication: Wife at bedside. Disposition Plan: Home 11/18/16 if creatinine improves.   Medical Consultants:    None.   Procedures:    None  Anti-Infectives:    None  Subjective:   The patient reports that he has not had any nausea or vomiting since admission. Tolerated clear liquids this morning. Review of symptoms is negative for dyspnea, diarrhea and abdominal pain.  Objective:    Vitals:   11/18/16 1745 11/18/16 1800 11/18/16 2146 11/19/16 0527  BP: 137/68 138/68 130/63 137/68  Pulse: (!) 53 (!) 54 (!) 55 (!) 56  Resp: 22 21 18 17   Temp:   98.4 F (36.9 C) 98.3 F (36.8 C)  TempSrc:      SpO2: 93% 95% 94% 96%  Weight:   85.3 kg (188 lb)   Height:   5\' 9"  (1.753 m)     Intake/Output Summary (Last 24 hours) at 11/19/16 0825 Last data filed at 11/19/16 0600  Gross per 24 hour  Intake              368 ml  Output              200 ml  Net              168 ml   Filed Weights   11/18/16 2146  Weight: 85.3 kg (188 lb)    Exam: General exam: Appears calm and comfortable.  Respiratory system: Clear to auscultation. Respiratory effort normal. Cardiovascular system: S1 & S2 heard, RRR. No JVD,  rubs, gallops or clicks. No murmurs. Gastrointestinal system:  Abdomen is nondistended, soft and nontender. No organomegaly or masses felt. Normal bowel sounds heard. Central nervous system: Alert and oriented. No focal neurological deficits. Extremities: No clubbing,  or cyanosis. No edema. Skin: No rashes, lesions or ulcers. Psychiatry: Judgement and insight appear normal. Mood & affect appropriate.   Data Reviewed:   I have personally reviewed following labs and imaging studies:  Labs: Basic Metabolic Panel:  Recent Labs Lab 11/18/16 1301 11/18/16 1806 11/19/16 0349  NA 136  --  139  K  3.4*  --  3.6  CL 102  --  106  CO2 24  --  22  GLUCOSE 167*  --  169*  BUN 61*  --  59*  CREATININE 3.06* 2.87* 2.41*  CALCIUM 8.4*  --  7.9*   GFR Estimated Creatinine Clearance: 25.6 mL/min (by C-G formula based on SCr of 2.41 mg/dL (H)). Liver Function Tests:  Recent Labs Lab 11/18/16 1301  AST 116*  ALT 90*  ALKPHOS 212*  BILITOT 1.5*  PROT 6.5  ALBUMIN 2.4*    Recent Labs Lab 11/18/16 1301  LIPASE 49   Coagulation profile  Recent Labs Lab 11/18/16 1806  INR 1.26    CBC:  Recent Labs Lab 11/18/16 1301 11/19/16 0349  WBC 8.6 7.1  HGB 14.2 13.0  HCT 41.2 38.3*  MCV 77.9* 77.8*  PLT 152 167   CBG:  Recent Labs Lab 11/18/16 2144  GLUCAP 158*   Hgb A1c:  Recent Labs  11/18/16 1806  HGBA1C 8.4*   Sepsis Labs:  Recent Labs Lab 11/18/16 1301 11/18/16 1315 11/19/16 0349  WBC 8.6  --  7.1  LATICACIDVEN  --  1.14  --     Microbiology No results found for this or any previous visit (from the past 240 hour(s)).  Radiology: Mr Abdomen Wo Contrast  Result Date: 11/19/2016 CLINICAL DATA:  Evaluate right renal lesion seen on ultrasound. EXAM: MRI ABDOMEN WITHOUT CONTRAST TECHNIQUE: Multiplanar multisequence MR imaging was performed without the administration of intravenous contrast. COMPARISON:  11/18/2016 ultrasound examination.  CT scan 09/29/2010 FINDINGS: Examination is limited by respiratory motion and lack of IV contrast. Lower chest: The lung bases are grossly clear. Suspect streaky atelectasis but no pulmonary lesions, pleural or pericardial effusion. Hepatobiliary: No obvious hepatic lesions or intrahepatic biliary dilatation. The gallbladder is distended and there is moderate wall thickening which is slightly irregular. Small amount of pericholecystic fluid is also noted. No common bile duct dilatation. No obvious gallstones. Probable gallbladder sludge. Acalculus cholecystitis is possible. No focal gallbladder mass is identified.  Pancreas: No mass, inflammation or ductal dilatation. There is a small duodenum diverticulum noted near the pancreatic head. Spleen: Mild splenomegaly. The spleen measures 15 x 12.5 x 9 cm. No focal lesions. Adrenals/Urinary Tract:  The adrenal glands are grossly normal. Small bilateral simple appearing renal cysts are noted. New line there is a 4 cm upper pole right renal lesion which is complex. It demonstrates layering low T2 signal intensity and non dependent intermediate T2 signal intensity. There is also intermediate T1 signal intensity. This could be a complex partially hemorrhagic cyst or possibly a neoplasm with hemorrhage. Evaluation is very limited without contrast. It is diffusion negative. Options would include close sonographic follow-up versus biopsy. Stomach/Bowel: The stomach, duodenum, visualized small bowel and visualize colon are grossly normal. Vascular/Lymphatic: No mesenteric or retroperitoneal mass or adenopathy. The major vascular structures are grossly normal. Other:  No ascites or abdominal wall hernia. Musculoskeletal: No significant bony findings. IMPRESSION: 1.  Limited examination due to respiratory motion and lack of IV contrast. The 4 cm upper pole right renal lesion is indeterminate. Options include close sonographic follow-up versus biopsy. 2. Gallbladder wall thickening and small amount of pericholecystic fluid could suggest acalculus cholecystitis. Nuclear medicine hepatobiliary scan may be helpful. Electronically Signed   By: Marijo Sanes M.D.   On: 11/19/2016 08:20   US Abdomen Complete  Result Date: 11/18/2016 CLINICAL DATA:  Abdominal pain and vomiting for 1 week. Chronic kidney disease. EXAM: ABDOMEN ULTRASOUND COMPLETE COMPARISON:  None. FINDINGS: Gallbladder: The gallbladder wall is slightly thickened and edematous with cystic changes in the gallbladder there are. Small amount of ascites around the gallbladder. No stones. Common bile duct: Diameter: 5.4 mm, normal.  Liver: No focal lesion identified. Within normal limits in parenchymal echogenicity. IVC: No abnormality visualized. Pancreas: The visualized portion is normal. Spleen: Slightly prominent at 14.4 cm in length. Volume is 621 cubic cm. Right Kidney: Length: 12.1 cm there is. Abnormal increased echogenicity consistent with renal medical disease. Exophytic solid mass extending from the upper pole measuring 3.8 x 3.7 x 3.5 cm. Left Kidney: Length: 11.8 cm. Increased echogenicity of the renal parenchyma consistent with renal medical disease. 17 mm cyst in the upper pole, 2.2 cm cyst in the mid kidney and 2.3 cm slightly complex cyst in the lower pole. Abdominal aorta: No aneurysm visualized. Other findings: None. IMPRESSION: 1. Abnormal echogenicity of the renal parenchyma bilaterally consistent with renal medical disease. 2. 3.8 cm solid exophytic mass on the upper pole of the right kidney worrisome for malignancy. MRI of the abdomen is recommended further assessment of this renal mass. 3. Findings consistent with chronic cholecystitis without cholelithiasis. Electronically Signed   By: Lorriane Shire M.D.   On: 11/18/2016 15:43   Dg Abdomen Acute W/chest  Result Date: 11/18/2016 CLINICAL DATA:  Vomiting, fever for week, right-sided over abdomen pain EXAM: DG ABDOMEN ACUTE W/ 1V CHEST COMPARISON:  Chest x-ray of 05/12/2013 FINDINGS: Moderate cardiomegaly is stable. No pneumonia or effusion is seen. Median sternotomy sutures are noted from prior CABG. Supine and erect views of the abdomen show no bowel obstruction. No radiographic evidence of constipation is seen. No free air is seen on the erect view. No opaque calculi are noted. IMPRESSION: 1. Stable cardiomegaly.  No active lung disease. 2. No bowel obstruction.  No free air. Electronically Signed   By: Ivar Drape M.D.   On: 11/18/2016 14:35    Medications:   . amLODipine  5 mg Oral Daily  . cloNIDine  0.1 mg Oral BID  . heparin  5,000 Units Subcutaneous  Q8H  . insulin aspart  0-5 Units Subcutaneous QHS  . insulin aspart  0-9 Units Subcutaneous TID WC  . insulin detemir  15 Units Subcutaneous QHS  . sodium chloride flush  3 mL Intravenous Q12H   Continuous Infusions:  This is time based visit. Greater than 35 minutes spent, with greater than 50% of that time spent in face-to-face interaction and counseling the patient regarding his options for working up his right kidney mass.   LOS: 0 days   Escher Harr  [facility] Pager [phone]. If unable to reach me by pager, please call my cell phone at [phone].  *Please refer to [email or web address], password [email or web address] to get updated schedule on who will round on this patient, as hospitalists switch teams weekly. If 7PM-7AM, please contact night-coverage at [email or web address], password [email or web address] for any overnight needs.  11/19/2016, 8:25 AM

## 2016-11-20 DIAGNOSIS — Z794 Long term (current) use of insulin: Secondary | ICD-10-CM

## 2016-11-20 DIAGNOSIS — N183 Chronic kidney disease, stage 3 (moderate): Secondary | ICD-10-CM

## 2016-11-20 DIAGNOSIS — E1122 Type 2 diabetes mellitus with diabetic chronic kidney disease: Secondary | ICD-10-CM

## 2016-11-20 LAB — COMPREHENSIVE METABOLIC PANEL
ALBUMIN: 2 g/dL — AB (ref 3.5–5.0)
ALT: 50 U/L (ref 17–63)
AST: 37 U/L (ref 15–41)
Alkaline Phosphatase: 158 U/L — ABNORMAL HIGH (ref 38–126)
Anion gap: 6 (ref 5–15)
BUN: 49 mg/dL — AB (ref 6–20)
CHLORIDE: 109 mmol/L (ref 101–111)
CO2: 27 mmol/L (ref 22–32)
Calcium: 8.3 mg/dL — ABNORMAL LOW (ref 8.9–10.3)
Creatinine, Ser: 1.79 mg/dL — ABNORMAL HIGH (ref 0.61–1.24)
GFR calc Af Amer: 39 mL/min — ABNORMAL LOW (ref >60)
GFR, EST NON AFRICAN AMERICAN: 34 mL/min — AB (ref >60)
GLUCOSE: 99 mg/dL (ref 65–99)
POTASSIUM: 3.9 mmol/L (ref 3.5–5.1)
SODIUM: 142 mmol/L (ref 135–145)
Total Bilirubin: 0.8 mg/dL (ref 0.3–1.2)
Total Protein: 5.8 g/dL — ABNORMAL LOW (ref 6.5–8.1)

## 2016-11-20 LAB — GLUCOSE, CAPILLARY
GLUCOSE-CAPILLARY: 80 mg/dL (ref 65–99)
Glucose-Capillary: 163 mg/dL — ABNORMAL HIGH (ref 65–99)

## 2016-11-20 NOTE — Discharge Summary (Signed)
Physician Discharge Summary  [name]:[id number] DOB: November 19, 1932 DOA: 11/18/2016  PCP: Joseph Bolt, MD  Admit date: 11/18/2016 Discharge date: 11/20/2016  Admitted From: Home Discharge disposition: Home   Recommendations for Outpatient Follow-Up:   1. Will need outpatient referral to urology to discuss workup of right renal mass.   Discharge Diagnosis:   Principal Problem:    Acute kidney injury superimposed on chronic kidney disease ([facility]) secondary to dehydration in the setting of probable viral gastroenteritis with nausea, vomiting and diarrhea Active Problems:    CAD (coronary artery disease)    HTN (hypertension)    Nausea vomiting and diarrhea    Renal mass    Gastroenteritis    Diabetes ([facility])    Elevated transaminase level  Discharge Condition: Improved.  Diet recommendation: Low sodium, heart healthy.  Carbohydrate-modified.    History of Present Illness:   [name] is an 82 y.o. male with medical history significant hypertension, diabetes, coronary artery disease and chronic kidney disease who was admitted 11/18/16 with a one-week history of nausea/vomiting. Upon initial presentation, he had a fever of 102 but was otherwise hemodynamically stable. Creatinine was elevated at 3.06.    Hospital Course by Problem:   Principal Problem:   Acute kidney injury superimposed on chronic kidney disease ([place]) secondary to dehydration in the setting of probable viral gastroenteritis with nausea, vomiting and diarrhea Patient was admitted and provided with gentle IV fluids. Nephrotoxic medications were held. Plain films were negative for bowel obstruction. Baseline creatinine 3 years ago was 1.5. Renal ultrasound consistent with medical renal disease but did show an incidental renal mass on the right. Discharged home with complete symptom resolution on 11/20/16.  Active Problems:   CAD (coronary artery disease) Continue aspirin, statin and beta  blocker.    HTN (hypertension) Continue clonidine, nadolol and Norvasc. Patient instructed to hold his lisinopril and Lasix until 11/23/16.    Renal mass, right MRI of the abdomen shows a 4 cm right upper pole renal mass that is indeterminant. Options include close follow-up versus outpatient biopsy. Discussion held with the patient and his wife, opted for close outpatient follow up. Referred to IR clinic for a discussion of how to proceed with work up/biopsy.     Diabetes ([place]) Hemoglobin A1c is 8.4%. Needs close outpatient follow-up to assess glycemic control.    Elevated transaminase level Likely related to acute gastroenteritis, LFTs improved at discharge.    Medical Consultants:    None.   Discharge Exam:   Vitals:   11/20/16 0638 11/20/16 0840  BP: 140/69 132/61  Pulse: (!) 53 (!) 54  Resp: 17 16  Temp: 98.5 F (36.9 C) 98.5 F (36.9 C)   Vitals:   11/19/16 1652 11/19/16 1936 11/20/16 0638 11/20/16 0840  BP: (!) 114/59 137/60 140/69 132/61  Pulse: 74 (!) 51 (!) 53 (!) 54  Resp: 17 18 17 16   Temp: 97.4 F (36.3 C) 98.7 F (37.1 C) 98.5 F (36.9 C) 98.5 F (36.9 C)  TempSrc: Oral   Oral  SpO2: 98% 97% 97% 98%  Weight:  87.1 kg (192 lb)    Height:        General exam: Appears calm and comfortable.  Respiratory system: Clear to auscultation. Respiratory effort normal. Cardiovascular system: S1 & S2 heard, RRR. No JVD,  rubs, gallops or clicks. No murmurs. Gastrointestinal system: Abdomen is nondistended, soft and nontender. No organomegaly or masses felt. Normal bowel sounds heard. Central nervous system: Alert and oriented.  No focal neurological deficits. Extremities: No clubbing,  or cyanosis. No edema. Skin: No rashes, lesions or ulcers. Psychiatry: Judgement and insight appear normal. Mood & affect appropriate.    The results of significant diagnostics from this hospitalization (including imaging, microbiology, ancillary and laboratory) are listed  below for reference.     Procedures and Diagnostic Studies:   Mr Abdomen Wo Contrast  Result Date: 11/19/2016 CLINICAL DATA:  Evaluate right renal lesion seen on ultrasound. EXAM: MRI ABDOMEN WITHOUT CONTRAST TECHNIQUE: Multiplanar multisequence MR imaging was performed without the administration of intravenous contrast. COMPARISON:  11/18/2016 ultrasound examination.  CT scan 09/29/2010 FINDINGS: Examination is limited by respiratory motion and lack of IV contrast. Lower chest: The lung bases are grossly clear. Suspect streaky atelectasis but no pulmonary lesions, pleural or pericardial effusion. Hepatobiliary: No obvious hepatic lesions or intrahepatic biliary dilatation. The gallbladder is distended and there is moderate wall thickening which is slightly irregular. Small amount of pericholecystic fluid is also noted. No common bile duct dilatation. No obvious gallstones. Probable gallbladder sludge. Acalculus cholecystitis is possible. No focal gallbladder mass is identified. Pancreas: No mass, inflammation or ductal dilatation. There is a small duodenum diverticulum noted near the pancreatic head. Spleen: Mild splenomegaly. The spleen measures 15 x 12.5 x 9 cm. No focal lesions. Adrenals/Urinary Tract:  The adrenal glands are grossly normal. Small bilateral simple appearing renal cysts are noted. New line there is a 4 cm upper pole right renal lesion which is complex. It demonstrates layering low T2 signal intensity and non dependent intermediate T2 signal intensity. There is also intermediate T1 signal intensity. This could be a complex partially hemorrhagic cyst or possibly a neoplasm with hemorrhage. Evaluation is very limited without contrast. It is diffusion negative. Options would include close sonographic follow-up versus biopsy. Stomach/Bowel: The stomach, duodenum, visualized small bowel and visualize colon are grossly normal. Vascular/Lymphatic: No mesenteric or retroperitoneal mass or  adenopathy. The major vascular structures are grossly normal. Other:  No ascites or abdominal wall hernia. Musculoskeletal: No significant bony findings. IMPRESSION: 1. Limited examination due to respiratory motion and lack of IV contrast. The 4 cm upper pole right renal lesion is indeterminate. Options include close sonographic follow-up versus biopsy. 2. Gallbladder wall thickening and small amount of pericholecystic fluid could suggest acalculus cholecystitis. Nuclear medicine hepatobiliary scan may be helpful. Electronically Signed   By: Marijo Sanes M.D.   On: 11/19/2016 08:20   US Abdomen Complete  Result Date: 11/18/2016 CLINICAL DATA:  Abdominal pain and vomiting for 1 week. Chronic kidney disease. EXAM: ABDOMEN ULTRASOUND COMPLETE COMPARISON:  None. FINDINGS: Gallbladder: The gallbladder wall is slightly thickened and edematous with cystic changes in the gallbladder there are. Small amount of ascites around the gallbladder. No stones. Common bile duct: Diameter: 5.4 mm, normal. Liver: No focal lesion identified. Within normal limits in parenchymal echogenicity. IVC: No abnormality visualized. Pancreas: The visualized portion is normal. Spleen: Slightly prominent at 14.4 cm in length. Volume is 621 cubic cm. Right Kidney: Length: 12.1 cm there is. Abnormal increased echogenicity consistent with renal medical disease. Exophytic solid mass extending from the upper pole measuring 3.8 x 3.7 x 3.5 cm. Left Kidney: Length: 11.8 cm. Increased echogenicity of the renal parenchyma consistent with renal medical disease. 17 mm cyst in the upper pole, 2.2 cm cyst in the mid kidney and 2.3 cm slightly complex cyst in the lower pole. Abdominal aorta: No aneurysm visualized. Other findings: None. IMPRESSION: 1. Abnormal echogenicity of the renal parenchyma bilaterally consistent with  renal medical disease. 2. 3.8 cm solid exophytic mass on the upper pole of the right kidney worrisome for malignancy. MRI of the  abdomen is recommended further assessment of this renal mass. 3. Findings consistent with chronic cholecystitis without cholelithiasis. Electronically Signed   By: Lorriane Shire M.D.   On: 11/18/2016 15:43   Dg Abdomen Acute W/chest  Result Date: 11/18/2016 CLINICAL DATA:  Vomiting, fever for week, right-sided over abdomen pain EXAM: DG ABDOMEN ACUTE W/ 1V CHEST COMPARISON:  Chest x-ray of 05/12/2013 FINDINGS: Moderate cardiomegaly is stable. No pneumonia or effusion is seen. Median sternotomy sutures are noted from prior CABG. Supine and erect views of the abdomen show no bowel obstruction. No radiographic evidence of constipation is seen. No free air is seen on the erect view. No opaque calculi are noted. IMPRESSION: 1. Stable cardiomegaly.  No active lung disease. 2. No bowel obstruction.  No free air. Electronically Signed   By: Ivar Drape M.D.   On: 11/18/2016 14:35     Labs:   Basic Metabolic Panel:  Recent Labs Lab 11/18/16 1301 11/18/16 1806 11/19/16 0349 11/20/16 0513  NA 136  --  139 142  K 3.4*  --  3.6 3.9  CL 102  --  106 109  CO2 24  --  22 27  GLUCOSE 167*  --  169* 99  BUN 61*  --  59* 49*  CREATININE 3.06* 2.87* 2.41* 1.79*  CALCIUM 8.4*  --  7.9* 8.3*   GFR Estimated Creatinine Clearance: 34.8 mL/min (by C-G formula based on SCr of 1.79 mg/dL (H)). Liver Function Tests:  Recent Labs Lab 11/18/16 1301 11/20/16 0513  AST 116* 37  ALT 90* 50  ALKPHOS 212* 158*  BILITOT 1.5* 0.8  PROT 6.5 5.8*  ALBUMIN 2.4* 2.0*    Recent Labs Lab 11/18/16 1301  LIPASE 49   No results for input(s): AMMONIA in the last 168 hours. Coagulation profile  Recent Labs Lab 11/18/16 1806  INR 1.26    CBC:  Recent Labs Lab 11/18/16 1301 11/19/16 0349  WBC 8.6 7.1  HGB 14.2 13.0  HCT 41.2 38.3*  MCV 77.9* 77.8*  PLT 152 167   CBG:  Recent Labs Lab 11/19/16 1142 11/19/16 1614 11/19/16 2139 11/20/16 0759 11/20/16 1148  GLUCAP 235* 284* 215* 80 163*    Hgb A1c  Recent Labs  11/18/16 1806  HGBA1C 8.4*     Discharge Instructions:   Discharge Instructions    Call MD for:  extreme fatigue    Complete by:  As directed    Call MD for:  persistant dizziness or light-headedness    Complete by:  As directed    Call MD for:  persistant nausea and vomiting    Complete by:  As directed    Diet - low sodium heart healthy    Complete by:  As directed    Discharge instructions    Complete by:  As directed    You may resume your lotensin and lasix (2 of your blood pressure medicines) on Monday as long as you do not have any further diarrhea or vomiting.  Drink plenty of fluids.   Increase activity slowly    Complete by:  As directed      Allergies as of 11/20/2016   No Known Allergies     Medication List    STOP taking these medications   benazepril 40 MG tablet Commonly known as:  LOTENSIN   furosemide 40 MG tablet Commonly  known as:  LASIX     TAKE these medications   amLODipine 5 MG tablet Commonly known as:  NORVASC Take 5 mg by mouth daily.   cloNIDine 0.1 MG tablet Commonly known as:  CATAPRES Take 0.1 mg by mouth 2 (two) times daily.   ezetimibe 10 MG tablet Commonly known as:  ZETIA Take 10 mg by mouth daily.   HUMALOG KWIKPEN 100 UNIT/ML KiwkPen Generic drug:  insulin lispro Inject 10 Units into the skin daily.   LEVEMIR 100 UNIT/ML injection Generic drug:  insulin detemir Inject 30 Units into the skin 2 (two) times daily.   nadolol 40 MG tablet Commonly known as:  CORGARD Take 40 mg by mouth daily.   rosuvastatin 20 MG tablet Commonly known as:  CRESTOR Take 20 mg by mouth daily.   SYNTHROID 100 MCG tablet Generic drug:  levothyroxine Take 100 mcg by mouth daily.   TRILIPIX 45 MG capsule Generic drug:  Choline Fenofibrate Take 45 mg by mouth daily.      Follow-up Information    Joseph Bolt, MD. Schedule an appointment as soon as possible for a visit in 1 week.   Specialty:   Endocrinology Why:  For follow up blood work on 12/09/2015 3:00p Contact information: [address] [place] [address] [phone]            Time coordinating discharge: 35 minutes.  Signed:  Huxton Glaus  Pager [phone] [facility] 11/20/2016, 2:54 PM

## 2016-11-20 NOTE — Care Management Obs Status (Signed)
[place] NOTIFICATION   Patient Details  Name: [name] MRN: [id number] Date of Birth: 12-10-1931   Medicare Observation Status Notification Given:  Yes OBS notification given on 11/19/2016 and pt determined to be INPT later in the evening. Pt notified of change on 11/20/2016.   Ahrianna Siglin, Rory Percy, RN 11/20/2016, 11:50 AM

## 2016-11-20 NOTE — Progress Notes (Signed)
Patient was discharged, IV and telemetry were discontinued. AVS was reviewed with patient and family. Patient left floor via wheelchair with volunteer.

## 2016-11-20 NOTE — Care Management Note (Signed)
Case Management Note  Patient Details  Name: [name] MRN: [id number] Date of Birth: 03-10-1932  Subjective/Objective:        CM following for progression and d/c planning.             Action/Plan: 11/20/2016 Met with pt re d/c and explained change to INPT status, no HH or DME needs identified. Pt for d/c to home today.   Expected Discharge Date:    11/20/2016           Expected Discharge Plan:  Home/Self Care  In-House Referral:  NA  Discharge planning Services     Post Acute Care Choice:  NA Choice offered to:  NA  DME Arranged:   NA DME Agency:   NA  HH Arranged:   NA HH Agency:   NA  Status of Service:  Completed, signed off  If discussed at [place] of Stay Meetings, dates discussed:    Additional Comments:  Adron Bene, RN 11/20/2016, 12:49 PM

## 2016-11-20 NOTE — Consult Note (Signed)
            [facility] CM Primary Care Navigator  11/20/2016  ELIAB CLOSSON 04-13-1932 [id number]    Went to see patient at the bedside to identify possible discharge needs but patient was already discharged per nursing staff.  Patient was discharged to home today.   For additional questions please contact:  Edwena Felty A. Deaysia Grigoryan, BSN, RN-BC [facility] PRIMARY CARE Navigator Cell: [phone]

## 2016-11-23 ENCOUNTER — Other Ambulatory Visit: Payer: Self-pay | Admitting: Internal Medicine

## 2016-11-23 DIAGNOSIS — N2889 Other specified disorders of kidney and ureter: Secondary | ICD-10-CM

## 2016-12-08 DIAGNOSIS — R109 Unspecified abdominal pain: Secondary | ICD-10-CM | POA: Diagnosis not present

## 2016-12-08 DIAGNOSIS — Z09 Encounter for follow-up examination after completed treatment for conditions other than malignant neoplasm: Secondary | ICD-10-CM | POA: Diagnosis not present

## 2016-12-08 DIAGNOSIS — I10 Essential (primary) hypertension: Secondary | ICD-10-CM | POA: Diagnosis not present

## 2016-12-09 ENCOUNTER — Ambulatory Visit
Admission: RE | Admit: 2016-12-09 | Discharge: 2016-12-09 | Disposition: A | Discharge status: Home / Self Care | Payer: Medicare Other | Source: Ambulatory Visit | Attending: Internal Medicine | Admitting: Internal Medicine

## 2016-12-09 DIAGNOSIS — N2889 Other specified disorders of kidney and ureter: Secondary | ICD-10-CM | POA: Diagnosis not present

## 2016-12-09 HISTORY — PX: IR GENERIC HISTORICAL: IMG1180011

## 2016-12-09 HISTORY — DX: Other specified disorders of kidney and ureter: N28.89

## 2016-12-09 NOTE — Consult Note (Signed)
Chief Complaint  Patient presents with  . Advice Only    Consult for Right Renal Mass/Bx &/or Cryoablation    Referring Physician(s): Rama,Christina P Herrick (Urology)  History of Present Illness: [name] is a 83 y.o. male with past medical history significant for CAD, type diabetes, hypertension and chronic kidney disease who was found to have an indeterminate right-sided renal lesion found on the abdominal ultrasound performed 11/18/2016 for the evaluation of right upper quadrant abdominal pain. Subsequent MRI demonstrated an indeterminate 4 cm exophytic lesion arising from the superior pole of the right kidney which was incompletely characterized secondary to lack of intravenous contrast, the worrisome for a renal cell carcinoma. Patient presents today for percutaneous evaluation and management. The patient is accompanied by his wife and daughter though serves as his own historian.  Patient continues to complain of right upper abdominal quadrant pain though this is anterior and my assumption is this is attributable to the findings of chronic cholecystitis seen on abdominal MRI. He has no symptoms specifically attributable to the instantly discovered right-sided renal lesion. Specifically, no flank pain. No hematuria. No unintentional weight loss or weight gain. No fever or chills.  Past Medical History:  Diagnosis Date  . Chronic kidney disease   . Coronary artery disease   . Diabetes mellitus   . Hypertension   . Right renal mass     Past Surgical History:  Procedure Laterality Date  . CARDIAC CATHETERIZATION  1996  . CORONARY ARTERY BYPASS GRAFT    . DOPPLER ECHOCARDIOGRAPHY  2011  . HERNIA REPAIR      Allergies: Patient has no known allergies.  Medications: Prior to Admission medications   Medication Sig Start Date End Date Taking? Authorizing Provider  amLODipine (NORVASC) 5 MG tablet Take 5 mg by mouth daily.  10/04/15  Yes Historical Provider, MD    aspirin 325 MG EC tablet Take 325 mg by mouth daily.   Yes Historical Provider, MD  Choline Fenofibrate (TRILIPIX) 45 MG capsule Take 45 mg by mouth daily.   Yes Historical Provider, MD  cloNIDine (CATAPRES) 0.1 MG tablet Take 0.1 mg by mouth 2 (two) times daily.   Yes Historical Provider, MD  ezetimibe (ZETIA) 10 MG tablet Take 10 mg by mouth daily.   Yes Historical Provider, MD  HUMALOG KWIKPEN 100 UNIT/ML KiwkPen Inject 10 Units into the skin daily. 07/25/15  Yes Historical Provider, MD  LEVEMIR 100 UNIT/ML injection Inject 30 Units into the skin 2 (two) times daily.  10/17/15  Yes Historical Provider, MD  nadolol (CORGARD) 40 MG tablet Take 40 mg by mouth daily.   Yes Historical Provider, MD  rosuvastatin (CRESTOR) 20 MG tablet Take 20 mg by mouth daily.   Yes Historical Provider, MD  SYNTHROID 100 MCG tablet Take 100 mcg by mouth daily.  10/17/15  Yes Historical Provider, MD     Family History  Problem Relation Age of Onset  . Diabetes Mother   . Diabetes Sister     Social History   Social History  . Marital status: Married    Spouse name: N/A  . Number of children: N/A  . Years of education: N/A   Social History Main Topics  . Smoking status: Former Research scientist (life sciences)  . Smokeless tobacco: Never Used  . Alcohol use No  . Drug use: No  . Sexual activity: Not on file   Other Topics Concern  . Not on file   Social History Narrative  . No  narrative on file    ECOG Status: 1 - Symptomatic but completely ambulatory  Review of Systems: A 12 point ROS discussed and pertinent positives are indicated in the HPI above.  All other systems are negative.  Review of Systems  Constitutional: Negative for activity change, appetite change and fever.  Respiratory: Negative.   Cardiovascular: Negative.   Gastrointestinal: Positive for abdominal pain.       Right or quadrant abdominal pain.  Genitourinary: Negative for flank pain and hematuria.    Vital Signs: BP (!) 144/63 (BP Location:  Left Arm, Patient Position: Sitting, Cuff Size: Normal)   Pulse 60   Temp 99.6 F (37.6 C) (Oral)   Resp 14   Ht 5\' 9"  (1.753 m)   Wt 196 lb (88.9 kg)   SpO2 94%   BMI 28.94 kg/m   Physical Exam  Constitutional: He appears well-developed and well-nourished.  Cardiovascular: Normal rate and regular rhythm.   Pulmonary/Chest: Effort normal and breath sounds normal.  Abdominal: Soft. Bowel sounds are normal. There is tenderness.  The patient experiences pain with palpation of the right upper abdominal quadrant.  Psychiatric: He has a normal mood and affect. His behavior is normal.  Nursing note and vitals reviewed.   Imaging: Mr Abdomen Wo Contrast  Result Date: 11/19/2016 CLINICAL DATA:  Evaluate right renal lesion seen on ultrasound. EXAM: MRI ABDOMEN WITHOUT CONTRAST TECHNIQUE: Multiplanar multisequence MR imaging was performed without the administration of intravenous contrast. COMPARISON:  11/18/2016 ultrasound examination.  CT scan 09/29/2010 FINDINGS: Examination is limited by respiratory motion and lack of IV contrast. Lower chest: The lung bases are grossly clear. Suspect streaky atelectasis but no pulmonary lesions, pleural or pericardial effusion. Hepatobiliary: No obvious hepatic lesions or intrahepatic biliary dilatation. The gallbladder is distended and there is moderate wall thickening which is slightly irregular. Small amount of pericholecystic fluid is also noted. No common bile duct dilatation. No obvious gallstones. Probable gallbladder sludge. Acalculus cholecystitis is possible. No focal gallbladder mass is identified. Pancreas: No mass, inflammation or ductal dilatation. There is a small duodenum diverticulum noted near the pancreatic head. Spleen: Mild splenomegaly. The spleen measures 15 x 12.5 x 9 cm. No focal lesions. Adrenals/Urinary Tract:  The adrenal glands are grossly normal. Small bilateral simple appearing renal cysts are noted. New line there is a 4 cm upper  pole right renal lesion which is complex. It demonstrates layering low T2 signal intensity and non dependent intermediate T2 signal intensity. There is also intermediate T1 signal intensity. This could be a complex partially hemorrhagic cyst or possibly a neoplasm with hemorrhage. Evaluation is very limited without contrast. It is diffusion negative. Options would include close sonographic follow-up versus biopsy. Stomach/Bowel: The stomach, duodenum, visualized small bowel and visualize colon are grossly normal. Vascular/Lymphatic: No mesenteric or retroperitoneal mass or adenopathy. The major vascular structures are grossly normal. Other:  No ascites or abdominal wall hernia. Musculoskeletal: No significant bony findings. IMPRESSION: 1. Limited examination due to respiratory motion and lack of IV contrast. The 4 cm upper pole right renal lesion is indeterminate. Options include close sonographic follow-up versus biopsy. 2. Gallbladder wall thickening and small amount of pericholecystic fluid could suggest acalculus cholecystitis. Nuclear medicine hepatobiliary scan may be helpful. Electronically Signed   By: Marijo Sanes M.D.   On: 11/19/2016 08:20   US Abdomen Complete  Result Date: 11/18/2016 CLINICAL DATA:  Abdominal pain and vomiting for 1 week. Chronic kidney disease. EXAM: ABDOMEN ULTRASOUND COMPLETE COMPARISON:  None. FINDINGS: Gallbladder: The  gallbladder wall is slightly thickened and edematous with cystic changes in the gallbladder there are. Small amount of ascites around the gallbladder. No stones. Common bile duct: Diameter: 5.4 mm, normal. Liver: No focal lesion identified. Within normal limits in parenchymal echogenicity. IVC: No abnormality visualized. Pancreas: The visualized portion is normal. Spleen: Slightly prominent at 14.4 cm in length. Volume is 621 cubic cm. Right Kidney: Length: 12.1 cm there is. Abnormal increased echogenicity consistent with renal medical disease. Exophytic solid  mass extending from the upper pole measuring 3.8 x 3.7 x 3.5 cm. Left Kidney: Length: 11.8 cm. Increased echogenicity of the renal parenchyma consistent with renal medical disease. 17 mm cyst in the upper pole, 2.2 cm cyst in the mid kidney and 2.3 cm slightly complex cyst in the lower pole. Abdominal aorta: No aneurysm visualized. Other findings: None. IMPRESSION: 1. Abnormal echogenicity of the renal parenchyma bilaterally consistent with renal medical disease. 2. 3.8 cm solid exophytic mass on the upper pole of the right kidney worrisome for malignancy. MRI of the abdomen is recommended further assessment of this renal mass. 3. Findings consistent with chronic cholecystitis without cholelithiasis. Electronically Signed   By: Lorriane Shire M.D.   On: 11/18/2016 15:43   Dg Abdomen Acute W/chest  Result Date: 11/18/2016 CLINICAL DATA:  Vomiting, fever for week, right-sided over abdomen pain EXAM: DG ABDOMEN ACUTE W/ 1V CHEST COMPARISON:  Chest x-ray of 05/12/2013 FINDINGS: Moderate cardiomegaly is stable. No pneumonia or effusion is seen. Median sternotomy sutures are noted from prior CABG. Supine and erect views of the abdomen show no bowel obstruction. No radiographic evidence of constipation is seen. No free air is seen on the erect view. No opaque calculi are noted. IMPRESSION: 1. Stable cardiomegaly.  No active lung disease. 2. No bowel obstruction.  No free air. Electronically Signed   By: Ivar Drape M.D.   On: 11/18/2016 14:35    Labs:  CBC:  Recent Labs  11/18/16 1301 11/19/16 0349  WBC 8.6 7.1  HGB 14.2 13.0  HCT 41.2 38.3*  PLT 152 167    COAGS:  Recent Labs  11/18/16 1806  INR 1.26    BMP:  Recent Labs  11/18/16 1301 11/18/16 1806 11/19/16 0349 11/20/16 0513  NA 136  --  139 142  K 3.4*  --  3.6 3.9  CL 102  --  106 109  CO2 24  --  22 27  GLUCOSE 167*  --  169* 99  BUN 61*  --  59* 49*  CALCIUM 8.4*  --  7.9* 8.3*  CREATININE 3.06* 2.87* 2.41* 1.79*    GFRNONAA 18* 19* 23* 34*  GFRAA 20* 22* 27* 39*    LIVER FUNCTION TESTS:  Recent Labs  11/18/16 1301 11/20/16 0513  BILITOT 1.5* 0.8  AST 116* 37  ALT 90* 50  ALKPHOS 212* 158*  PROT 6.5 5.8*  ALBUMIN 2.4* 2.0*    Assessment and Plan:  [name] is a 83 y.o. male with past medical history significant for CAD, type diabetes, hypertension and chronic kidney disease who was found to have an indeterminate right-sided renal lesion found on the abdominal ultrasound performed 11/18/2016 for the evaluation of right upper quadrant abdominal pain with ubsequent MRI demonstrated an indeterminate 4 cm exophytic lesion arising from the superior pole of the right kidney which was incompletely characterized secondary to lack of intravenous contrast, though worrisome for a renal cell carcinoma.   I explained to the patient that definitive treatment for  presumed renal cell carcinoma is surgical resection either with partial or complete nephrectomy. I explained that given his medical comorbidities, in particular his renal insufficiency, he might not be a candidate for  resection and ultimately cryoablation may be requested. The patient is scheduled to see Dr. Louis Meckel of urology on 12/17/2016 who will ultimately determine his operative candidacy.  I explained that another potential treatment option could entail proceeding with ultrasound or CT-guided renal lesion biopsy though giving the fact the lesion was not present on remote abdominal CT performed 06/26/2010, I am very concerned this lesion represents a slowly growing renal cell carcinoma and biopsy as often avoided in this setting.  Initial conversations were held with the patient regarding the risks and benefits of cryoablation including but not limited to failure to treat entire lesion, bleeding, infection, damage to adjacent structures, decrease in renal function or post procedural neuropathy.    I explained to the patient that given the size of  the lesion (approximately 4 cm), the lesion is slightly larger than those typically undergoing cryoablation (lesions are typically ablated if 3 cm or less), though as the lesion is predominantly exophytic cryoablation could be considered with the use of additional ablation probes.  If this patient is not deemed an operative candidate by Dr. Louis Meckel and cryoablation is requested, I will see the patient in repeat consultation at interventional radiology clinic to more formally discuss this procedure.  In regards to the patient's right upper quadrant abdominal pain, I feel these symptoms are likely attributable to the findings of acute cholecystitis seen on abdominal MRI and not to be attributable to this incidentally discovered renal lesion.  In review of his most recent hospitalization, I do not see a formal surgical consultation and as such will arrange for the patient to be seen by [facility] ([facility] for potential elective cholecystectomy.  Note, as the patient appears symptomatic for this chronic cholecystitis, cholecystectomy may precede any renal directed therapy.  Thank you for this interesting consult.  I greatly enjoyed meeting [name] and look forward to participating in their care.  A copy of this report was sent to the requesting provider on this date.  Electronically Signed: [name] 12/09/2016, 3:50 PM   I spent a total of 40 Minutes in face to face in clinical consultation, greater than 50% of which was counseling/coordinating care for Right sided renal lesion

## 2016-12-11 ENCOUNTER — Encounter: Payer: Self-pay | Admitting: Interventional Radiology

## 2016-12-11 ENCOUNTER — Emergency Department (HOSPITAL_COMMUNITY)
Admission: EM | Admit: 2016-12-11 | Discharge: 2016-12-12 | Disposition: A | Discharge status: Home / Self Care | Payer: Medicare Other | Attending: Emergency Medicine | Admitting: Emergency Medicine

## 2016-12-11 DIAGNOSIS — Z955 Presence of coronary angioplasty implant and graft: Secondary | ICD-10-CM | POA: Diagnosis not present

## 2016-12-11 DIAGNOSIS — I129 Hypertensive chronic kidney disease with stage 1 through stage 4 chronic kidney disease, or unspecified chronic kidney disease: Secondary | ICD-10-CM | POA: Diagnosis not present

## 2016-12-11 DIAGNOSIS — K811 Chronic cholecystitis: Secondary | ICD-10-CM | POA: Diagnosis not present

## 2016-12-11 DIAGNOSIS — Z951 Presence of aortocoronary bypass graft: Secondary | ICD-10-CM | POA: Insufficient documentation

## 2016-12-11 DIAGNOSIS — Z79899 Other long term (current) drug therapy: Secondary | ICD-10-CM | POA: Insufficient documentation

## 2016-12-11 DIAGNOSIS — Z7982 Long term (current) use of aspirin: Secondary | ICD-10-CM | POA: Diagnosis not present

## 2016-12-11 DIAGNOSIS — I2510 Atherosclerotic heart disease of native coronary artery without angina pectoris: Secondary | ICD-10-CM | POA: Diagnosis not present

## 2016-12-11 DIAGNOSIS — R112 Nausea with vomiting, unspecified: Secondary | ICD-10-CM | POA: Diagnosis not present

## 2016-12-11 DIAGNOSIS — N189 Chronic kidney disease, unspecified: Secondary | ICD-10-CM | POA: Insufficient documentation

## 2016-12-11 DIAGNOSIS — Z794 Long term (current) use of insulin: Secondary | ICD-10-CM | POA: Insufficient documentation

## 2016-12-11 DIAGNOSIS — Z87891 Personal history of nicotine dependence: Secondary | ICD-10-CM | POA: Insufficient documentation

## 2016-12-11 DIAGNOSIS — R1011 Right upper quadrant pain: Secondary | ICD-10-CM | POA: Diagnosis present

## 2016-12-11 LAB — URINALYSIS, ROUTINE W REFLEX MICROSCOPIC
BILIRUBIN URINE: NEGATIVE
Glucose, UA: NEGATIVE mg/dL
Ketones, ur: NEGATIVE mg/dL
LEUKOCYTES UA: NEGATIVE
NITRITE: NEGATIVE
Protein, ur: 100 mg/dL — AB
SPECIFIC GRAVITY, URINE: 1.014 (ref 1.005–1.030)
pH: 5 (ref 5.0–8.0)

## 2016-12-11 LAB — CBC
HCT: 34.3 % — ABNORMAL LOW (ref 39.0–52.0)
Hemoglobin: 11.5 g/dL — ABNORMAL LOW (ref 13.0–17.0)
MCH: 26.7 pg (ref 26.0–34.0)
MCHC: 33.5 g/dL (ref 30.0–36.0)
MCV: 79.8 fL (ref 78.0–100.0)
PLATELETS: 134 10*3/uL — AB (ref 150–400)
RBC: 4.3 MIL/uL (ref 4.22–5.81)
RDW: 15.9 % — AB (ref 11.5–15.5)
WBC: 7.6 10*3/uL (ref 4.0–10.5)

## 2016-12-11 LAB — COMPREHENSIVE METABOLIC PANEL
ALT: 62 U/L (ref 17–63)
AST: 43 U/L — AB (ref 15–41)
Albumin: 2.7 g/dL — ABNORMAL LOW (ref 3.5–5.0)
Alkaline Phosphatase: 141 U/L — ABNORMAL HIGH (ref 38–126)
Anion gap: 8 (ref 5–15)
BILIRUBIN TOTAL: 1.2 mg/dL (ref 0.3–1.2)
BUN: 34 mg/dL — AB (ref 6–20)
CO2: 25 mmol/L (ref 22–32)
CREATININE: 2.32 mg/dL — AB (ref 0.61–1.24)
Calcium: 8.5 mg/dL — ABNORMAL LOW (ref 8.9–10.3)
Chloride: 104 mmol/L (ref 101–111)
GFR calc Af Amer: 28 mL/min — ABNORMAL LOW (ref >60)
GFR, EST NON AFRICAN AMERICAN: 25 mL/min — AB (ref >60)
Glucose, Bld: 205 mg/dL — ABNORMAL HIGH (ref 65–99)
Potassium: 3.8 mmol/L (ref 3.5–5.1)
Sodium: 137 mmol/L (ref 135–145)
TOTAL PROTEIN: 6.4 g/dL — AB (ref 6.5–8.1)

## 2016-12-11 NOTE — ED Triage Notes (Addendum)
Pt presents with c/o abd pain. The pain began about 4 weeks ago. The pain has been intermittent since onset. He reports chills, anorexia, nausea, vomiting. He denies urinary symptoms, constipation, diarrhea. The pain is decreased with rest and increased with movement. He was here in December with similar complaint and states he was diagnosed with a mass on his kidney. He was referred to a specialist but his follow up appointment is not until next week and he is concerned this may not be the cause of his pain

## 2016-12-11 NOTE — ED Notes (Signed)
Patient reports R sided abdominal/flank pain that is intermittent. Also endorses N/V. Pt sts area is tender and sore to touch and pain increases with movement and is relieved with rest.

## 2016-12-11 NOTE — ED Provider Notes (Signed)
[facility] DEPT Provider Note   CSN: [id number] Arrival date & time: 12/11/16  1513  By signing my name below, I, Gwenlyn Fudge, attest that this documentation has been prepared under the direction and in the presence of Alfonzo Beers, MD. Electronically Signed: Gwenlyn Fudge, ED Scribe. 12/11/16. 11:34 PM.   History   Chief Complaint Chief Complaint  Patient presents with  . Abdominal Pain   Pt was seen in ED last month with similar symptoms and diagnosed with a renal mass. He has an appointment set for next week to see a specialist and another appointment with a surgeon on 1/16 for his gallbladder, but he has started to experience abdominal pain again. He is able to keep down liquids, but is unable to tolerate solid food.    The history is provided by the patient. No language interpreter was used.  Abdominal Pain   This is a new problem. The current episode started more than 1 week ago. Episode frequency: Pt only experiences pain when moving. The problem has not changed since onset.The pain is associated with an unknown factor. The pain is located in the RUQ and RLQ. The pain is moderate. Associated symptoms include nausea and vomiting. Pertinent negatives include diarrhea, constipation, dysuria and hematuria. Exacerbated by: movement. The symptoms are relieved by being still. Past workup includes ultrasound.  pt currently denies having any pain.  Pain is unchanged in nature  Past Medical History:  Diagnosis Date  . Chronic kidney disease   . Coronary artery disease   . Diabetes mellitus   . Hypertension   . Right renal mass     Patient Active Problem List   Diagnosis Date Noted  . Acute kidney injury superimposed on chronic kidney disease ([place]) 11/18/2016  . Nausea vomiting and diarrhea 11/18/2016  . Renal mass 11/18/2016  . Gastroenteritis 11/18/2016  . Cholecystitis 11/18/2016  . Diabetes ([place]) 11/18/2016  . Elevated transaminase level 11/18/2016  . Dyslipidemia  05/12/2013  . Syncope 01/14/2012  . Hypoglycemia 01/14/2012  . CKD (chronic kidney disease) 01/14/2012  . CAD (coronary artery disease) 01/14/2012  . HTN (hypertension) 01/14/2012    Past Surgical History:  Procedure Laterality Date  . CARDIAC CATHETERIZATION  1996  . CORONARY ARTERY BYPASS GRAFT    . DOPPLER ECHOCARDIOGRAPHY  2011  . HERNIA REPAIR    . IR GENERIC HISTORICAL  12/09/2016   IR RADIOLOGIST EVAL & MGMT 12/09/2016 [name], MD [facility] INTERV RAD       Home Medications    Prior to Admission medications   Medication Sig Start Date End Date Taking? Authorizing Provider  amLODipine (NORVASC) 5 MG tablet Take 5 mg by mouth daily.  10/04/15   Historical Provider, MD  aspirin 325 MG EC tablet Take 325 mg by mouth daily.    Historical Provider, MD  Choline Fenofibrate (TRILIPIX) 45 MG capsule Take 45 mg by mouth daily.    Historical Provider, MD  cloNIDine (CATAPRES) 0.1 MG tablet Take 0.1 mg by mouth 2 (two) times daily.    Historical Provider, MD  ezetimibe (ZETIA) 10 MG tablet Take 10 mg by mouth daily.    Historical Provider, MD  HUMALOG KWIKPEN 100 UNIT/ML KiwkPen Inject 10 Units into the skin daily. 07/25/15   Historical Provider, MD  LEVEMIR 100 UNIT/ML injection Inject 30 Units into the skin 2 (two) times daily.  10/17/15   Historical Provider, MD  nadolol (CORGARD) 40 MG tablet Take 40 mg by mouth daily.    Historical Provider, MD  ondansetron (ZOFRAN ODT) 4 MG disintegrating tablet Take 1 tablet (4 mg total) by mouth every 8 (eight) hours as needed for nausea or vomiting. 12/12/16   Alfonzo Beers, MD  oxyCODONE (ROXICODONE) 5 MG immediate release tablet Take 1 tablet (5 mg total) by mouth every 4 (four) hours as needed for severe pain. 12/12/16   Alfonzo Beers, MD  rosuvastatin (CRESTOR) 20 MG tablet Take 20 mg by mouth daily.    Historical Provider, MD  SYNTHROID 100 MCG tablet Take 100 mcg by mouth daily.  10/17/15   Historical Provider, MD    Family History Family  History  Problem Relation Age of Onset  . Diabetes Mother   . Diabetes Sister     Social History Social History  Substance Use Topics  . Smoking status: Former Research scientist (life sciences)  . Smokeless tobacco: Never Used  . Alcohol use No     Allergies   Patient has no known allergies.   Review of Systems Review of Systems  Constitutional: Positive for appetite change, chills, diaphoresis and fatigue.  Gastrointestinal: Positive for abdominal pain, nausea and vomiting. Negative for constipation and diarrhea.  Genitourinary: Negative for difficulty urinating, dysuria and hematuria.  Psychiatric/Behavioral: Positive for sleep disturbance.  All other systems reviewed and are negative.  Physical Exam Updated Vital Signs BP 134/70   Pulse (!) 53   Temp 98.3 F (36.8 C) (Oral)   Resp 12   SpO2 95%  Vitals reviewed Physical Exam Physical Examination: General appearance - alert, well appearing, and in no distress Mental status - alert, oriented to person, place, and time Eyes - no conjunctival injection, no scleral icterus Mouth - mucous membranes moist, pharynx normal without lesions Neck - supple, no significant adenopathy Chest - clear to auscultation, no wheezes, rales or rhonchi, symmetric air entry Heart - normal rate, regular rhythm, normal S1, S2, no murmurs, rubs, clicks or gallops Abdomen - soft, nontender, nondistended, no masses or organomegaly, nabs Neurological - alert, oriented, normal speech Extremities - peripheral pulses normal, no pedal edema, no clubbing or cyanosis Skin - normal coloration and turgor, no rashes  ED Treatments / Results  DIAGNOSTIC STUDIES: Oxygen Saturation is 99% on RA, normal by my interpretation.    COORDINATION OF CARE: 11:24 PM Discussed treatment plan with pt at bedside and pt agreed to plan.  Labs (all labs ordered are listed, but only abnormal results are displayed) Labs Reviewed  COMPREHENSIVE METABOLIC PANEL - Abnormal; Notable for the  following:       Result Value   Glucose, Bld 205 (*)    BUN 34 (*)    Creatinine, Ser 2.32 (*)    Calcium 8.5 (*)    Total Protein 6.4 (*)    Albumin 2.7 (*)    AST 43 (*)    Alkaline Phosphatase 141 (*)    GFR calc non Af Amer 25 (*)    GFR calc Af Amer 28 (*)    All other components within normal limits  CBC - Abnormal; Notable for the following:    Hemoglobin 11.5 (*)    HCT 34.3 (*)    RDW 15.9 (*)    Platelets 134 (*)    All other components within normal limits  URINALYSIS, ROUTINE W REFLEX MICROSCOPIC - Abnormal; Notable for the following:    Hgb urine dipstick MODERATE (*)    Protein, ur 100 (*)    Bacteria, UA RARE (*)    Squamous Epithelial / LPF 0-5 (*)    All other  components within normal limits    EKG  EKG Interpretation None       Radiology No results found.  Procedures Procedures (including critical care time)  Medications Ordered in ED Medications - No data to display   Initial Impression / Assessment and Plan / ED Course  I have reviewed the triage vital signs and the nursing notes.  Pertinent labs & imaging results that were available during my care of the patient were reviewed by me and considered in my medical decision making (see chart for details).  Clinical Course     Pt presenting with c/o right sided abdominal pain- pain not currently present in the ED- abdominal exam is benign without tenderness to palpation. Pt has known chronic cholecystitis and has appointment to see surgery scheduled.  He also has known renal mass- this is also being followed up as an outpatient.  Patient expressed some concern that he was told the renal mass was the cause of the pain.  After review of chart and d/w patient that cause of pain is thought to be due to chronic cholecystitis- pt felt reassured.  He has appointment scheduled with surgery, labs are not significantly changed from prior.  He has no current pain.  He requests rx for nausea medication and pain  meds so he can use if needed at home.  This was provided.  Discharged with strict return precautions.  Pt agreeable with plan.  Final Clinical Impressions(s) / ED Diagnoses   Final diagnoses:  Chronic cholecystitis  Non-intractable vomiting with nausea, unspecified vomiting type    New Prescriptions Discharge Medication List as of 12/12/2016 12:12 AM    START taking these medications   Details  ondansetron (ZOFRAN ODT) 4 MG disintegrating tablet Take 1 tablet (4 mg total) by mouth every 8 (eight) hours as needed for nausea or vomiting., Starting Sat 12/12/2016, Print    oxyCODONE (ROXICODONE) 5 MG immediate release tablet Take 1 tablet (5 mg total) by mouth every 4 (four) hours as needed for severe pain., Starting Sat 12/12/2016, Print       I personally performed the services described in this documentation, which was scribed in my presence. The recorded information has been reviewed and is accurate.     Alfonzo Beers, MD 12/13/16 [phone]

## 2016-12-12 MED ORDER — ONDANSETRON 4 MG PO TBDP: 4.0000 mg | ORAL_TABLET | Freq: Three times a day (TID) | ORAL | 0 refills | Status: DC | PRN

## 2016-12-12 MED ORDER — OXYCODONE HCL 5 MG PO TABS: 5.0000 mg | ORAL_TABLET | ORAL | 0 refills | Status: DC | PRN

## 2016-12-12 NOTE — Discharge Instructions (Signed)
Return to the ED with any concerns including vomiting and not able to keep down liquids or your medications, abdominal pain especially if it localizes to the right lower abdomen, fever or chills, and decreased urine output, decreased level of alertness or lethargy, or any other alarming symptoms.  °

## 2016-12-12 NOTE — ED Notes (Signed)
Pt and family verbalize understanding of d/c instructions. Pt requests to ambulate to waiting area vs WC.

## 2016-12-17 DIAGNOSIS — D3000 Benign neoplasm of unspecified kidney: Secondary | ICD-10-CM | POA: Diagnosis not present

## 2016-12-21 ENCOUNTER — Telehealth: Payer: Self-pay | Admitting: Internal Medicine

## 2016-12-21 ENCOUNTER — Telehealth (HOSPITAL_COMMUNITY): Payer: Self-pay | Admitting: Internal Medicine

## 2016-12-21 DIAGNOSIS — Z951 Presence of aortocoronary bypass graft: Secondary | ICD-10-CM

## 2016-12-21 DIAGNOSIS — Z01810 Encounter for preprocedural cardiovascular examination: Secondary | ICD-10-CM

## 2016-12-21 NOTE — Telephone Encounter (Signed)
In reference to potential surgery - patient notified of need for stress test & agrees to proceed. Test ordered. Staff message sent to [facility] to schedule/schedule follow up. Pixie Casino, MD  Michael Boston, MD; Elsie Ra Gwyn Cc: Illene Regulus; [name], MD; Fidel Levy, RN        Richardson Landry-   [name] was supposed to have a stress test last year - I last saw him in 2016. His bypass grafts are now over 56 years old. I would recommend that he have a lexiscan stress myoview before proceeding with elective cholecystectomy. We should be able to arrange it this week or next.   -Mali

## 2016-12-22 ENCOUNTER — Ambulatory Visit: Payer: Self-pay | Admitting: Surgery

## 2016-12-22 DIAGNOSIS — N2889 Other specified disorders of kidney and ureter: Secondary | ICD-10-CM | POA: Diagnosis not present

## 2016-12-22 DIAGNOSIS — K8010 Calculus of gallbladder with chronic cholecystitis without obstruction: Secondary | ICD-10-CM | POA: Diagnosis not present

## 2016-12-22 NOTE — H&P (Signed)
[name] 12/22/2016 11:11 AM Location: [facility] Patient #: [id number] DOB: Jan 30, 1932 Married / Language: English / Race: Black or African American Male  History of Present Illness Adin Hector MD; 12/22/2016 12:12 PM) Patient words: GB.  The patient is a 83 year old male who presents for evaluation of gall stones. Note for "Gall stones": Patient sent for surgical consultation at the request of Dr. Louis Meckel with urology. Concern for biliary colic and probable chronic calculus cholecystitis. Incidental right renal cell cancer. Consideration of combinational robotic surgery.  Pleasant elderly gentleman. Comes today with his wife. History of cardiac disease status post coronary bypass grafting.. Insulin requiring diabetic. Glucoses are under relatively decent control. By Dr. Berdine Addison T with cardiology. His cardiac function and workup in 2016. Overdue follow-up. The patient had an episode of severe right-sided abdominal pain and nausea and vomiting. He went to the hospital a month ago. Increased liver function tests. Concern for possible gastroenteritis. However story seemed to be more suggestive of gallbladder etiology. Ultrasound showed stones and some gallbladder wall thickening. Right upper pole renal mass incidentally noted. MRI confirmed 4 cm lesion suspicious for renal cell cancer. Negative for any cardiac or pulmonary etiology. Consent for urology evaluation. Strongly suspicious for renal cell carcinoma. Options of cryoablation versus robotic partial nephrectomy discussed. Dr. Louis Meckel with Urology d/w me and wondered if concurrent cholecystectomy could be done robotically as well. He definitely wanted cardiac clearance to see what the patient's risks were before offering options. He is already been seen by Dr. Pascal Lux with interventional radiology to consider cryoablation.  Patient claims he can walk about 20 minutes without difficulty. No exertional chest  pain. He does not report exercise regularly ago. He had had no fall or trauma on that right side. No car accidents. Moderate alcohol use but has been abstinent for 2 decades. He had a left inguinal hernia repair done over a decade ago. No other abdominal surgeries. He tends to have constipation, moving his bowels maybe twice a week. No personal nor family history of GI/colon cancer, inflammatory bowel disease, irritable bowel syndrome, allergy such as Celiac Sprue, dietary/dairy problems, colitis, ulcers nor gastritis. No recent sick contacts/gastroenteritis. No travel outside the country. No changes in diet. No dysphagia to solids or liquids. No significant heartburn or reflux. No hematochezia, hematemesis, coffee ground emesis. No evidence of prior gastric/peptic ulceration. He had a colonoscopy in 2009 that showed some benign polyps. I don't think he's had a follow-up since given his advanced age.   Past Surgical History (Ammie Eversole, LPN; 7/67/3419 37:90 AM) Appendectomy Bypass Surgery for Poor Blood Flow to Legs  Diagnostic Studies History (Ammie Eversole, LPN; 2/40/9735 32:99 AM) Colonoscopy >10 years ago  Allergies (Ammie Eversole, LPN; 2/42/6834 19:62 AM) No Known Drug Allergies 12/22/2016  Medication History (Ammie Eversole, LPN; 2/29/7989 21:19 AM) Norvasc (5MG  Tablet, Oral) Active. Aspirin (325MG  Tablet, Oral) Active. Trilipix (45MG  Capsule DR, Oral) Active. Catapres (0.1MG  Tablet, Oral) Active. Zetia (10MG  Tablet, Oral) Active. HumaLOG KwikPen (100UNIT/ML Solution, Subcutaneous) Active. Levemir (100UNIT/ML Solution, Subcutaneous) Active. Corgard (40MG  Tablet, Oral) Active. Zofran (4MG  Tablet, Oral) Active. Roxicodone (5MG  Tablet, Oral) Active. Crestor (20MG  Tablet, Oral) Active. Synthroid (100MCG Tablet, Oral) Active. Medications Reconciled  Social History (Ammie Eversole, LPN; 03/23/4080 44:81 AM) Alcohol use Remotely quit alcohol  use. Caffeine use Coffee. No drug use Tobacco use Former smoker.  Other Problems (Ammie Eversole, LPN; 8/56/3149 70:26 AM) Diabetes Mellitus High blood pressure Hypercholesterolemia Myocardial infarction     Review of Systems (  Ammie Eversole LPN; 1/61/0960 45:40 AM) General Present- Appetite Loss, Chills and Night Sweats. Not Present- Fatigue, Fever, Weight Gain and Weight Loss. Skin Not Present- Change in Wart/Mole, Dryness, Hives, Jaundice, New Lesions, Non-Healing Wounds, Rash and Ulcer. HEENT Not Present- Earache, Hearing Loss, Hoarseness, Nose Bleed, Oral Ulcers, Ringing in the Ears, Seasonal Allergies, Sinus Pain, Sore Throat, Visual Disturbances, Wears glasses/contact lenses and Yellow Eyes. Respiratory Not Present- Bloody sputum, Chronic Cough, Difficulty Breathing, Snoring and Wheezing. Breast Not Present- Breast Mass, Breast Pain, Nipple Discharge and Skin Changes. Cardiovascular Not Present- Chest Pain, Difficulty Breathing Lying Down, Leg Cramps, Palpitations, Rapid Heart Rate, Shortness of Breath and Swelling of Extremities. Gastrointestinal Present- Nausea and Vomiting. Not Present- Abdominal Pain, Bloating, Bloody Stool, Change in Bowel Habits, Chronic diarrhea, Constipation, Difficulty Swallowing, Excessive gas, Gets full quickly at meals, Hemorrhoids, Indigestion and Rectal Pain. Male Genitourinary Not Present- Blood in Urine, Change in Urinary Stream, Frequency, Impotence, Nocturia, Painful Urination, Urgency and Urine Leakage. Musculoskeletal Not Present- Back Pain, Joint Pain, Joint Stiffness, Muscle Pain, Muscle Weakness and Swelling of Extremities. Neurological Not Present- Decreased Memory, Fainting, Headaches, Numbness, Seizures, Tingling, Tremor, Trouble walking and Weakness. Psychiatric Not Present- Anxiety, Bipolar, Change in Sleep Pattern, Depression, Fearful and Frequent crying. Endocrine Not Present- Cold Intolerance, Excessive Hunger, Hair Changes, Heat  Intolerance, Hot flashes and New Diabetes. Hematology Not Present- Blood Thinners, Easy Bruising, Excessive bleeding, Gland problems, HIV and Persistent Infections.  Vitals (Ammie Eversole LPN; 9/81/1914 78:29 AM) 12/22/2016 11:12 AM Weight: 193 lb Height: 69in Body Surface Area: 2.03 m Body Mass Index: 28.5 kg/m  Temp.: 98.52F(Oral)  Pulse: 58 (Regular)  BP: 138/78 (Sitting, Left Arm, Standard)      Physical Exam Adin Hector MD; 12/22/2016 12:12 PM)  General Mental Status-Alert. General Appearance-Not in acute distress, Not Sickly. Orientation-Oriented X3. Hydration-Well hydrated. Voice-Normal. Note: Well-developed. Slightly overweight. Moving around easily but slowly.  Integumentary Global Assessment Upon inspection and palpation of skin surfaces of the - Axillae: non-tender, no inflammation or ulceration, no drainage. and Distribution of scalp and body hair is normal. General Characteristics Temperature - normal warmth is noted.  Head and Neck Head-normocephalic, atraumatic with no lesions or palpable masses. Face Global Assessment - atraumatic, no absence of expression. Neck Global Assessment - no abnormal movements, no bruit auscultated on the right, no bruit auscultated on the left, no decreased range of motion, non-tender. Trachea-midline. Thyroid Gland Characteristics - non-tender.  Eye Eyeball - Left-Extraocular movements intact, No Nystagmus. Eyeball - Right-Extraocular movements intact, No Nystagmus. Cornea - Left-No Hazy. Cornea - Right-No Hazy. Sclera/Conjunctiva - Left-No scleral icterus, No Discharge. Sclera/Conjunctiva - Right-No scleral icterus, No Discharge. Pupil - Left-Direct reaction to light normal. Pupil - Right-Direct reaction to light normal.  ENMT Ears Pinna - Left - no drainage observed, no generalized tenderness observed. Right - no drainage observed, no generalized tenderness  observed. Nose and Sinuses External Inspection of the Nose - no destructive lesion observed. Inspection of the nares - Left - quiet respiration. Right - quiet respiration. Mouth and Throat Lips - Upper Lip - no fissures observed, no pallor noted. Lower Lip - no fissures observed, no pallor noted. Nasopharynx - no discharge present. Oral Cavity/Oropharynx - Tongue - no dryness observed. Oral Mucosa - no cyanosis observed. Hypopharynx - no evidence of airway distress observed.  Chest and Lung Exam Inspection Movements - Normal and Symmetrical. Accessory muscles - No use of accessory muscles in breathing. Palpation Palpation of the chest reveals - Non-tender. Auscultation Breath sounds -  Normal and Clear. Note: Median incision over her breast bone consistent with prior cardiac surgery  Cardiovascular Auscultation Rhythm - Regular. Murmurs & Other Heart Sounds - Auscultation of the heart reveals - No Murmurs and No Systolic Clicks.  Abdomen Inspection Inspection of the abdomen reveals - No Visible peristalsis and No Abnormal pulsations. Umbilicus - No Bleeding, No Urine drainage. Palpation/Percussion Palpation and Percussion of the abdomen reveal - Soft, Non Tender, No Rebound tenderness, No Rigidity (guarding) and No Cutaneous hyperesthesia. Note: Abdomen soft. Moderate diastasis recti. Some chronic hyperpigmented upper abdomen consistent with his insulin shots. Nontender, nondistended. No guarding. No diastasis. No umbilical nor other hernias  Male Genitourinary Sexual Maturity Tanner 5 - Adult hair pattern and Adult penile size and shape. Note: Prior left groin incision consistent with history of open inguinal hernia repair. No inguinal hernias. Normal external genitalia. Epididymi, testes, and spermatic cords normal without any masses.  Peripheral Vascular Upper Extremity Inspection - Left - No Cyanotic nailbeds, Not Ischemic. Right - No Cyanotic nailbeds, Not  Ischemic.  Neurologic Neurologic evaluation reveals -normal attention span and ability to concentrate, able to name objects and repeat phrases. Appropriate fund of knowledge , normal sensation and normal coordination. Mental Status Affect - not angry, not paranoid. Cranial Nerves-Normal Bilaterally. Gait-Normal.  Neuropsychiatric Mental status exam performed with findings of-able to articulate well with normal speech/language, rate, volume and coherence, thought content normal with ability to perform basic computations and apply abstract reasoning and no evidence of hallucinations, delusions, obsessions or homicidal/suicidal ideation.  Musculoskeletal Global Assessment Spine, Ribs and Pelvis - no instability, subluxation or laxity. Right Upper Extremity - no instability, subluxation or laxity.  Lymphatic Head & Neck  General Head & Neck Lymphatics: Bilateral - Description - No Localized lymphadenopathy. Axillary  General Axillary Region: Bilateral - Description - No Localized lymphadenopathy. Femoral & Inguinal  Generalized Femoral & Inguinal Lymphatics: Left - Description - No Localized lymphadenopathy. Right - Description - No Localized lymphadenopathy.    Assessment & Plan Adin Hector MD; 12/22/2016 12:05 PM)  CHRONIC CHOLECYSTITIS WITH CALCULUS (K80.10) Impression: Story of biliary colic rather classic. Risks of the differential diagnosis seems unlikely.  I think he would benefit from cholecystectomy at some point to break the cycle of attacks. Because his urologist would plan a partial nephrectomy leave robotically, I can proceed with cholecystectomy robotically as well. Left the robotic nephrectomy go first and then do cholecystectomy after that depending how things go. Fortunately the renal cell mass his ipsilateral, softly not to complicated for repositioning or extra ports. The graft the patient does have a prior coronary bypass graft with some cardiac history.  He is of advanced age. However, he actually seems to have pretty good activity level and exercise tolerance. 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 Leaning more towards being aggressive in dealing with this in conjunction with the partial nephrectomy of his right kidney.  I definitely would like cardiac clearance. Dr. Debara Pickett aware. Planning to do a stress test nuclear medicine study to see if cardiac function had adequate to do both.  Current Plans I recommended obtaining preoperative cardiac clearance. I am concerned about the health of the patient and the ability to tolerate the operation. Therefore, we will request clearance by cardiology to better assess operative risk & see if a reevaluation, further workup, etc is needed. Also recommendations on how medications such as for anticoagulation and blood pressure should be managed/held/restarted after surgery. You are being scheduled for surgery- Our schedulers  will call you.  You should hear from our office's scheduling department within 5 working days about the location, date, and time of surgery. We try to make accommodations for patient's preferences in scheduling surgery, but sometimes the OR schedule or the surgeon's schedule prevents [place] from making those accommodations.  If you have not heard from our office [phone]) in 5 working days, call the office and ask for your surgeon's nurse.  If you have other questions about your diagnosis, plan, or surgery, call the office and ask for your surgeon's nurse.  The anatomy & physiology of hepatobiliary & pancreatic function was discussed. The pathophysiology of gallbladder dysfunction was discussed. Natural history risks without surgery was discussed. I feel the risks of no intervention will lead to serious problems that outweigh the operative risks; therefore, I recommended cholecystectomy to remove the pathology. I explained laparoscopic techniques with possible need for an open  approach. Probable cholangiogram to evaluate the bilary tract was explained as well.  Risks such as bleeding, infection, abscess, leak, injury to other organs, need for further treatment, heart attack, death, and other risks were discussed. I noted a good likelihood this will help address the problem. Possibility that this will not correct all abdominal symptoms was explained. Goals of post-operative recovery were discussed as well. We will work to minimize complications. An educational handout further explaining the pathology and treatment options was given as well. Questions were answered. The patient expresses understanding & wishes to proceed with surgery.  Pt Education - Pamphlet Given - Laparoscopic Gallbladder Surgery: discussed with patient and provided information. Written instructions provided Pt Education - [facility] Laparosopic Post Op HCI (Deseree Zemaitis) Pt Education - Green (Brian Zeitlin) Pt Education - Laparoscopic Cholecystectomy: gallbladder RIGHT RENAL MASS (N28.89) Impression: Suspicious right renal mass. Most likely renal cell carcinoma. Dr. Louis Meckel planning to treat in some fashion. If reasonable operative risk, he was planning partial nephrectomy robotically. Severely high risk, will do cryoablation only. Deferred to him.  Adin Hector, M.D., F.A.C.S. Gastrointestinal and Minimally Invasive Surgery [facility] [address] [place], [place] [address] [phone] Main / Paging

## 2016-12-28 NOTE — Telephone Encounter (Signed)
Close encounter 

## 2016-12-30 DIAGNOSIS — I25118 Atherosclerotic heart disease of native coronary artery with other forms of angina pectoris: Secondary | ICD-10-CM | POA: Diagnosis not present

## 2016-12-30 DIAGNOSIS — Z79899 Other long term (current) drug therapy: Secondary | ICD-10-CM | POA: Diagnosis not present

## 2016-12-30 DIAGNOSIS — Z7902 Long term (current) use of antithrombotics/antiplatelets: Secondary | ICD-10-CM | POA: Diagnosis not present

## 2016-12-30 DIAGNOSIS — I248 Other forms of acute ischemic heart disease: Secondary | ICD-10-CM | POA: Diagnosis not present

## 2016-12-30 DIAGNOSIS — L249 Irritant contact dermatitis, unspecified cause: Secondary | ICD-10-CM | POA: Diagnosis not present

## 2016-12-30 DIAGNOSIS — I213 ST elevation (STEMI) myocardial infarction of unspecified site: Secondary | ICD-10-CM | POA: Diagnosis not present

## 2016-12-30 DIAGNOSIS — G8929 Other chronic pain: Secondary | ICD-10-CM | POA: Diagnosis not present

## 2017-01-01 ENCOUNTER — Telehealth (HOSPITAL_COMMUNITY): Payer: Self-pay

## 2017-01-01 NOTE — Telephone Encounter (Signed)
Encounter complete. 

## 2017-01-04 DIAGNOSIS — D3000 Benign neoplasm of unspecified kidney: Secondary | ICD-10-CM | POA: Diagnosis not present

## 2017-01-05 ENCOUNTER — Ambulatory Visit (HOSPITAL_COMMUNITY)
Admission: RE | Admit: 2017-01-05 | Discharge: 2017-01-05 | Disposition: A | Discharge status: Home / Self Care | Payer: Medicare Other | Source: Ambulatory Visit | Attending: Cardiovascular Disease | Admitting: Cardiovascular Disease

## 2017-01-05 DIAGNOSIS — Z951 Presence of aortocoronary bypass graft: Secondary | ICD-10-CM | POA: Diagnosis not present

## 2017-01-05 DIAGNOSIS — I252 Old myocardial infarction: Secondary | ICD-10-CM | POA: Insufficient documentation

## 2017-01-05 DIAGNOSIS — Z01810 Encounter for preprocedural cardiovascular examination: Secondary | ICD-10-CM | POA: Diagnosis not present

## 2017-01-05 LAB — MYOCARDIAL PERFUSION IMAGING
CHL CUP NUCLEAR SRS: 0
LV dias vol: 137 mL (ref 62–150)
LV sys vol: 76 mL
Peak HR: 61 {beats}/min
Rest HR: 47 {beats}/min
SDS: 1
SSS: 1
TID: 1.74

## 2017-01-05 MED ORDER — TECHNETIUM TC 99M TETROFOSMIN IV KIT
9.6000 | PACK | Freq: Once | INTRAVENOUS | Status: AC | PRN
  Administered 2017-01-05: 9.6 via INTRAVENOUS
  Filled 2017-01-05: qty 10

## 2017-01-05 MED ORDER — TECHNETIUM TC 99M TETROFOSMIN IV KIT
31.0000 | PACK | Freq: Once | INTRAVENOUS | Status: AC | PRN
  Administered 2017-01-05: 31 via INTRAVENOUS
  Filled 2017-01-05: qty 31

## 2017-01-05 MED ORDER — REGADENOSON 0.4 MG/5ML IV SOLN
0.4000 mg | Freq: Once | INTRAVENOUS | Status: AC
  Administered 2017-01-05: 0.4 mg via INTRAVENOUS

## 2017-01-07 ENCOUNTER — Other Ambulatory Visit: Payer: Self-pay | Admitting: *Deleted

## 2017-01-07 DIAGNOSIS — R931 Abnormal findings on diagnostic imaging of heart and coronary circulation: Secondary | ICD-10-CM

## 2017-01-07 DIAGNOSIS — I519 Heart disease, unspecified: Secondary | ICD-10-CM

## 2017-01-12 ENCOUNTER — Other Ambulatory Visit: Payer: Self-pay | Admitting: Urology

## 2017-02-10 NOTE — Progress Notes (Signed)
Clearance by Dr. Debara Pickett Cards in epic under CV procedures 01/05/17 stress test

## 2017-02-10 NOTE — Progress Notes (Signed)
11/18/16 abd w/ chest epic

## 2017-02-10 NOTE — Patient Instructions (Signed)
JOSHU FURUKAWA  02/10/2017   Your procedure is scheduled on:02/19/17   Report to [facility] Main  Entrance take [facility]  elevators to 3rd floor to  [place] at    0900 AM.  Call this number if you have problems the morning of surgery [phone]   Remember: ONLY 1 PERSON MAY GO WITH YOU TO SHORT STAY TO GET  READY MORNING OF [place].  Do not eat food or drink liquids :After Midnight.     Take these medicines the morning of surgery with A SIP OF WATER: amlodipine(norvasc), clonodine, nadolol, synthroid, eye drops if needed                 How to Manage Your Diabetes              Before and After Surgery  Why is it important to control my blood sugar before and after surgery? . Improving blood sugar levels before and after surgery helps healing and can limit problems. . A way of improving blood sugar control is eating a healthy diet by: o  Eating less sugar and carbohydrates o  Increasing activity/exercise o  Talking with your doctor about reaching your blood sugar goals . High blood sugars (greater than 180 mg/dL) can raise your risk of infections and slow your recovery, so you will need to focus on controlling your diabetes during the weeks before surgery. . Make sure that the doctor who takes care of your diabetes knows about your planned surgery including the date and location.  How do I manage my blood sugar before surgery? . Check your blood sugar at least 4 times a day, starting 2 days before surgery, to make sure that the level is not too high or low. o Check your blood sugar the morning of your surgery when you wake up and every 2 hours until you get to the Short Stay unit. . If your blood sugar is less than 70 mg/dL, you will need to treat for low blood sugar: o Do not take insulin. o Treat a low blood sugar (less than 70 mg/dL) with  cup of clear juice (cranberry or apple), 4 glucose tablets, OR glucose gel. o Recheck blood sugar in 15  minutes after treatment (to make sure it is greater than 70 mg/dL). If your blood sugar is not greater than 70 mg/dL on recheck, call [phone] for further instructions. . Report your blood sugar to the short stay nurse when you get to Short Stay.  . If you are admitted to the hospital after surgery: o Your blood sugar will be checked by the staff and you will probably be given insulin after surgery (instead of oral diabetes medicines) to make sure you have good blood sugar levels. o The goal for blood sugar control after surgery is 80-180 mg/dL.   WHAT DO I DO ABOUT MY DIABETES MEDICATION?  Marland Kitchen Do not take oral diabetes medicines (pills) the morning of surgery.  . THE NIGHT BEFORE SURGERY, take    50% of your levemir insulin    15  units of   insulin.       . THE MORNING OF SURGERY, take 50% of your Levemir insulin which is 15 units of   insulin.  . The day of surgery, do not take other diabetes injectables, including Byetta (exenatide), Bydureon (exenatide ER), Victoza (liraglutide), or Trulicity (dulaglutide).  YOU MAY TAKE YOUR HUMALOG AS USUAL THE DAY BEFORE SURGERY AT SUPPERTIME    THE DAY OF SURGERY DO NOT TAKE ANY HUMALOG .  Patient Signature:  Date:   Nurse Signature:  Date:   Reviewed and Endorsed by [facility] Patient Education Committee, August 2015                 You may not have any metal on your body including hair pins and              piercings  Do not wear jewelry, make-up, lotions, powders or perfumes, deodorant             Do not wear nail polish.  Do not shave  48 hours prior to surgery.              Men may shave face and neck.   Do not bring valuables to the hospital. [place].  Contacts, dentures or bridgework may not be worn into surgery.  Leave suitcase in the car. After surgery it may be brought to your room.     Patients discharged the day of surgery will not be allowed to drive home.  Name  and phone number of your driver:  Special Instructions: N/A              Please read over the following fact sheets you were given: _____________________________________________________________________             [facility] - Preparing for Surgery Before surgery, you can play an important role.  Because skin is not sterile, your skin needs to be as free of germs as possible.  You can reduce the number of germs on your skin by washing with CHG (chlorahexidine gluconate) soap before surgery.  CHG is an antiseptic cleaner which kills germs and bonds with the skin to continue killing germs even after washing. Please DO NOT use if you have an allergy to CHG or antibacterial soaps.  If your skin becomes reddened/irritated stop using the CHG and inform your nurse when you arrive at Short Stay. Do not shave (including legs and underarms) for at least 48 hours prior to the first CHG shower.  You may shave your face/neck. Please follow these instructions carefully:  1.  Shower with CHG Soap the night before surgery and the  morning of Surgery.  2.  If you choose to wash your hair, wash your hair first as usual with your  normal  shampoo.  3.  After you shampoo, rinse your hair and body thoroughly to remove the  shampoo.                           4.  Use CHG as you would any other liquid soap.  You can apply chg directly  to the skin and wash                       Gently with a scrungie or clean washcloth.  5.  Apply the CHG Soap to your body ONLY FROM THE NECK DOWN.   Do not use on face/ open                           Wound or open sores. Avoid contact with eyes, ears mouth  and genitals (private parts).                       Wash face,  Genitals (private parts) with your normal soap.             6.  Wash thoroughly, paying special attention to the area where your surgery  will be performed.  7.  Thoroughly rinse your body with warm water from the neck down.  8.  DO NOT shower/wash with your normal  soap after using and rinsing off  the CHG Soap.                9.  Pat yourself dry with a clean towel.            10.  Wear clean pajamas.            11.  Place clean sheets on your bed the night of your first shower and do not  sleep with pets. Day of Surgery : Do not apply any lotions/deodorants the morning of surgery.  Please wear clean clothes to the hospital/surgery center.  FAILURE TO FOLLOW THESE INSTRUCTIONS MAY RESULT IN THE CANCELLATION OF YOUR SURGERY PATIENT SIGNATURE_________________________________  NURSE SIGNATURE__________________________________  ________________________________________________________________________  WHAT IS A BLOOD TRANSFUSION? Blood Transfusion Information  A transfusion is the replacement of blood or some of its parts. Blood is made up of multiple cells which provide different functions.  Red blood cells carry oxygen and are used for blood loss replacement.  White blood cells fight against infection.  Platelets control bleeding.  Plasma helps clot blood.  Other blood products are available for specialized needs, such as hemophilia or other clotting disorders. BEFORE THE TRANSFUSION  Who gives blood for transfusions?   Healthy volunteers who are fully evaluated to make sure their blood is safe. This is blood bank blood. Transfusion therapy is the safest it has ever been in the practice of medicine. Before blood is taken from a donor, a complete history is taken to make sure that person has no history of diseases nor engages in risky social behavior (examples are intravenous drug use or sexual activity with multiple partners). The donor's travel history is screened to minimize risk of transmitting infections, such as malaria. The donated blood is tested for signs of infectious diseases, such as HIV and hepatitis. The blood is then tested to be sure it is compatible with you in order to minimize the chance of a transfusion reaction. If you or a  relative donates blood, this is often done in anticipation of surgery and is not appropriate for emergency situations. It takes many days to process the donated blood. RISKS AND COMPLICATIONS Although transfusion therapy is very safe and saves many lives, the main dangers of transfusion include:   Getting an infectious disease.  Developing a transfusion reaction. This is an allergic reaction to something in the blood you were given. Every precaution is taken to prevent this. The decision to have a blood transfusion has been considered carefully by your caregiver before blood is given. Blood is not given unless the benefits outweigh the risks. AFTER THE TRANSFUSION  Right after receiving a blood transfusion, you will usually feel much better and more energetic. This is especially true if your red blood cells have gotten low (anemic). The transfusion raises the level of the red blood cells which carry oxygen, and this usually causes an energy increase.  The nurse administering the transfusion will monitor you carefully for  complications. HOME CARE INSTRUCTIONS  No special instructions are needed after a transfusion. You may find your energy is better. Speak with your caregiver about any limitations on activity for underlying diseases you may have. SEEK MEDICAL CARE IF:   Your condition is not improving after your transfusion.  You develop redness or irritation at the intravenous (IV) site. SEEK IMMEDIATE MEDICAL CARE IF:  Any of the following symptoms occur over the next 12 hours:  Shaking chills.  You have a temperature by mouth above 102 F (38.9 C), not controlled by medicine.  Chest, back, or muscle pain.  People around you feel you are not acting correctly or are confused.  Shortness of breath or difficulty breathing.  Dizziness and fainting.  You get a rash or develop hives.  You have a decrease in urine output.  Your urine turns a dark color or changes to pink, red, or  brown. Any of the following symptoms occur over the next 10 days:  You have a temperature by mouth above 102 F (38.9 C), not controlled by medicine.  Shortness of breath.  Weakness after normal activity.  The white part of the eye turns yellow (jaundice).  You have a decrease in the amount of urine or are urinating less often.  Your urine turns a dark color or changes to pink, red, or brown. Document Released: 11/20/2000 Document Revised: 02/15/2012 Document Reviewed: 07/09/2008 [facility] Patient Information 2014 [place], [place].  _______________________________________________________________________

## 2017-02-15 ENCOUNTER — Encounter (HOSPITAL_COMMUNITY): Payer: Self-pay

## 2017-02-15 ENCOUNTER — Encounter (HOSPITAL_COMMUNITY)
Admission: RE | Admit: 2017-02-15 | Discharge: 2017-02-15 | Disposition: A | Discharge status: Home / Self Care | Payer: Medicare Other | Source: Ambulatory Visit | Attending: Urology | Admitting: Urology

## 2017-02-15 DIAGNOSIS — N2889 Other specified disorders of kidney and ureter: Secondary | ICD-10-CM | POA: Diagnosis not present

## 2017-02-15 DIAGNOSIS — Z01812 Encounter for preprocedural laboratory examination: Secondary | ICD-10-CM | POA: Diagnosis not present

## 2017-02-15 DIAGNOSIS — Z01810 Encounter for preprocedural cardiovascular examination: Secondary | ICD-10-CM | POA: Diagnosis not present

## 2017-02-15 HISTORY — DX: Acute myocardial infarction, unspecified: I21.9

## 2017-02-15 LAB — CBC
HEMATOCRIT: 40.2 % (ref 39.0–52.0)
HEMOGLOBIN: 13.5 g/dL (ref 13.0–17.0)
MCH: 27.4 pg (ref 26.0–34.0)
MCHC: 33.6 g/dL (ref 30.0–36.0)
MCV: 81.5 fL (ref 78.0–100.0)
Platelets: 205 10*3/uL (ref 150–400)
RBC: 4.93 MIL/uL (ref 4.22–5.81)
RDW: 16 % — ABNORMAL HIGH (ref 11.5–15.5)
WBC: 8.6 10*3/uL (ref 4.0–10.5)

## 2017-02-15 LAB — BASIC METABOLIC PANEL
ANION GAP: 6 (ref 5–15)
BUN: 17 mg/dL (ref 6–20)
CO2: 24 mmol/L (ref 22–32)
Calcium: 9 mg/dL (ref 8.9–10.3)
Chloride: 110 mmol/L (ref 101–111)
Creatinine, Ser: 1.47 mg/dL — ABNORMAL HIGH (ref 0.61–1.24)
GFR calc Af Amer: 49 mL/min — ABNORMAL LOW (ref >60)
GFR, EST NON AFRICAN AMERICAN: 43 mL/min — AB (ref >60)
GLUCOSE: 189 mg/dL — AB (ref 65–99)
POTASSIUM: 4 mmol/L (ref 3.5–5.1)
Sodium: 140 mmol/L (ref 135–145)

## 2017-02-15 LAB — GLUCOSE, CAPILLARY: Glucose-Capillary: 171 mg/dL — ABNORMAL HIGH (ref 65–99)

## 2017-02-15 LAB — ABO/RH: ABO/RH(D): O POS

## 2017-02-15 NOTE — Progress Notes (Signed)
BMP done 02/15/17 routed to Dr. Louis Meckel via epic

## 2017-02-16 NOTE — Progress Notes (Signed)
Unable to view ekg in epic . EKG final on chart

## 2017-02-19 ENCOUNTER — Inpatient Hospital Stay (HOSPITAL_COMMUNITY): Payer: Medicare Other

## 2017-02-19 ENCOUNTER — Encounter (HOSPITAL_COMMUNITY)
Admission: RE | Disposition: A | Discharge status: Home Health | Payer: Self-pay | Source: Ambulatory Visit | Attending: Urology

## 2017-02-19 ENCOUNTER — Inpatient Hospital Stay (HOSPITAL_COMMUNITY): Payer: Medicare Other | Admitting: Anesthesiology

## 2017-02-19 ENCOUNTER — Encounter (HOSPITAL_COMMUNITY): Payer: Self-pay | Admitting: *Deleted

## 2017-02-19 ENCOUNTER — Inpatient Hospital Stay (HOSPITAL_COMMUNITY)
Admission: RE | Admit: 2017-02-19 | Discharge: 2017-02-25 | DRG: 656 | Disposition: A | Discharge status: Home Health | Payer: Medicare Other | Source: Ambulatory Visit | Attending: Urology | Admitting: Urology

## 2017-02-19 DIAGNOSIS — K8010 Calculus of gallbladder with chronic cholecystitis without obstruction: Secondary | ICD-10-CM | POA: Diagnosis present

## 2017-02-19 DIAGNOSIS — R0902 Hypoxemia: Secondary | ICD-10-CM | POA: Diagnosis not present

## 2017-02-19 DIAGNOSIS — R0602 Shortness of breath: Secondary | ICD-10-CM

## 2017-02-19 DIAGNOSIS — Z905 Acquired absence of kidney: Secondary | ICD-10-CM

## 2017-02-19 DIAGNOSIS — Z794 Long term (current) use of insulin: Secondary | ICD-10-CM

## 2017-02-19 DIAGNOSIS — Z87891 Personal history of nicotine dependence: Secondary | ICD-10-CM

## 2017-02-19 DIAGNOSIS — C641 Malignant neoplasm of right kidney, except renal pelvis: Secondary | ICD-10-CM | POA: Diagnosis not present

## 2017-02-19 DIAGNOSIS — Z6829 Body mass index (BMI) 29.0-29.9, adult: Secondary | ICD-10-CM

## 2017-02-19 DIAGNOSIS — N189 Chronic kidney disease, unspecified: Secondary | ICD-10-CM | POA: Diagnosis not present

## 2017-02-19 DIAGNOSIS — K567 Ileus, unspecified: Secondary | ICD-10-CM | POA: Diagnosis not present

## 2017-02-19 DIAGNOSIS — N2889 Other specified disorders of kidney and ureter: Secondary | ICD-10-CM | POA: Diagnosis present

## 2017-02-19 DIAGNOSIS — J9600 Acute respiratory failure, unspecified whether with hypoxia or hypercapnia: Secondary | ICD-10-CM | POA: Diagnosis not present

## 2017-02-19 DIAGNOSIS — N183 Chronic kidney disease, stage 3 unspecified: Secondary | ICD-10-CM | POA: Diagnosis present

## 2017-02-19 DIAGNOSIS — I130 Hypertensive heart and chronic kidney disease with heart failure and stage 1 through stage 4 chronic kidney disease, or unspecified chronic kidney disease: Secondary | ICD-10-CM | POA: Diagnosis not present

## 2017-02-19 DIAGNOSIS — K819 Cholecystitis, unspecified: Secondary | ICD-10-CM | POA: Diagnosis not present

## 2017-02-19 DIAGNOSIS — Z951 Presence of aortocoronary bypass graft: Secondary | ICD-10-CM | POA: Diagnosis not present

## 2017-02-19 DIAGNOSIS — I2510 Atherosclerotic heart disease of native coronary artery without angina pectoris: Secondary | ICD-10-CM | POA: Diagnosis present

## 2017-02-19 DIAGNOSIS — K811 Chronic cholecystitis: Secondary | ICD-10-CM | POA: Diagnosis not present

## 2017-02-19 DIAGNOSIS — E119 Type 2 diabetes mellitus without complications: Secondary | ICD-10-CM

## 2017-02-19 DIAGNOSIS — R0989 Other specified symptoms and signs involving the circulatory and respiratory systems: Secondary | ICD-10-CM | POA: Diagnosis not present

## 2017-02-19 DIAGNOSIS — I252 Old myocardial infarction: Secondary | ICD-10-CM | POA: Diagnosis not present

## 2017-02-19 DIAGNOSIS — I5032 Chronic diastolic (congestive) heart failure: Secondary | ICD-10-CM

## 2017-02-19 DIAGNOSIS — J810 Acute pulmonary edema: Secondary | ICD-10-CM | POA: Diagnosis not present

## 2017-02-19 DIAGNOSIS — E785 Hyperlipidemia, unspecified: Secondary | ICD-10-CM | POA: Diagnosis present

## 2017-02-19 DIAGNOSIS — J95821 Acute postprocedural respiratory failure: Secondary | ICD-10-CM | POA: Diagnosis not present

## 2017-02-19 DIAGNOSIS — E7800 Pure hypercholesterolemia, unspecified: Secondary | ICD-10-CM | POA: Diagnosis present

## 2017-02-19 DIAGNOSIS — Z7982 Long term (current) use of aspirin: Secondary | ICD-10-CM

## 2017-02-19 DIAGNOSIS — N179 Acute kidney failure, unspecified: Secondary | ICD-10-CM | POA: Diagnosis not present

## 2017-02-19 DIAGNOSIS — E1122 Type 2 diabetes mellitus with diabetic chronic kidney disease: Secondary | ICD-10-CM | POA: Diagnosis not present

## 2017-02-19 DIAGNOSIS — R069 Unspecified abnormalities of breathing: Secondary | ICD-10-CM

## 2017-02-19 DIAGNOSIS — I509 Heart failure, unspecified: Secondary | ICD-10-CM | POA: Diagnosis present

## 2017-02-19 DIAGNOSIS — Y836 Removal of other organ (partial) (total) as the cause of abnormal reaction of the patient, or of later complication, without mention of misadventure at the time of the procedure: Secondary | ICD-10-CM | POA: Diagnosis not present

## 2017-02-19 DIAGNOSIS — D4101 Neoplasm of uncertain behavior of right kidney: Secondary | ICD-10-CM | POA: Diagnosis not present

## 2017-02-19 DIAGNOSIS — I129 Hypertensive chronic kidney disease with stage 1 through stage 4 chronic kidney disease, or unspecified chronic kidney disease: Secondary | ICD-10-CM | POA: Diagnosis not present

## 2017-02-19 DIAGNOSIS — E669 Obesity, unspecified: Secondary | ICD-10-CM | POA: Diagnosis present

## 2017-02-19 DIAGNOSIS — G9340 Encephalopathy, unspecified: Secondary | ICD-10-CM | POA: Diagnosis not present

## 2017-02-19 DIAGNOSIS — Z79899 Other long term (current) drug therapy: Secondary | ICD-10-CM | POA: Diagnosis not present

## 2017-02-19 HISTORY — PX: ROBOTIC ASSITED PARTIAL NEPHRECTOMY: SHX6087

## 2017-02-19 LAB — LACTIC ACID, PLASMA: LACTIC ACID, VENOUS: 1.1 mmol/L (ref 0.5–1.9)

## 2017-02-19 LAB — BASIC METABOLIC PANEL
Anion gap: 10 (ref 5–15)
BUN: 25 mg/dL — AB (ref 6–20)
CALCIUM: 8.3 mg/dL — AB (ref 8.9–10.3)
CO2: 21 mmol/L — ABNORMAL LOW (ref 22–32)
Chloride: 108 mmol/L (ref 101–111)
Creatinine, Ser: 1.96 mg/dL — ABNORMAL HIGH (ref 0.61–1.24)
GFR calc Af Amer: 35 mL/min — ABNORMAL LOW (ref >60)
GFR, EST NON AFRICAN AMERICAN: 30 mL/min — AB (ref >60)
Glucose, Bld: 115 mg/dL — ABNORMAL HIGH (ref 65–99)
Potassium: 3.7 mmol/L (ref 3.5–5.1)
Sodium: 139 mmol/L (ref 135–145)

## 2017-02-19 LAB — CK TOTAL AND CKMB (NOT AT ARMC)
CK, MB: 6.2 ng/mL — ABNORMAL HIGH (ref 0.5–5.0)
RELATIVE INDEX: 3.1 — AB (ref 0.0–2.5)
Total CK: 197 U/L (ref 49–397)

## 2017-02-19 LAB — BLOOD GAS, ARTERIAL
ACID-BASE DEFICIT: 4.5 mmol/L — AB (ref 0.0–2.0)
Acid-base deficit: 4.2 mmol/L — ABNORMAL HIGH (ref 0.0–2.0)
BICARBONATE: 21.9 mmol/L (ref 20.0–28.0)
Bicarbonate: 20.7 mmol/L (ref 20.0–28.0)
Delivery systems: POSITIVE
Drawn by: 270211
Expiratory PAP: 6
FIO2: 1
FIO2: 40
Inspiratory PAP: 16
Mode: POSITIVE
O2 SAT: 91.5 %
O2 SAT: 93 %
PCO2 ART: 45.8 mmHg (ref 32.0–48.0)
PCO2 ART: 40.3 mmHg (ref 32.0–48.0)
PO2 ART: 72.7 mmHg — AB (ref 83.0–108.0)
PO2 ART: 72 mmHg — AB (ref 83.0–108.0)
Patient temperature: 37
Patient temperature: 97.9
RATE: 14 resp/min
pH, Arterial: 7.3 — ABNORMAL LOW (ref 7.350–7.450)
pH, Arterial: 7.329 — ABNORMAL LOW (ref 7.350–7.450)

## 2017-02-19 LAB — TYPE AND SCREEN
ABO/RH(D): O POS
ANTIBODY SCREEN: NEGATIVE

## 2017-02-19 LAB — CBC WITH DIFFERENTIAL/PLATELET
Basophils Absolute: 0 10*3/uL (ref 0.0–0.1)
Basophils Relative: 0 %
EOS ABS: 0 10*3/uL (ref 0.0–0.7)
EOS PCT: 0 %
HCT: 40.7 % (ref 39.0–52.0)
HEMOGLOBIN: 13.7 g/dL (ref 13.0–17.0)
LYMPHS ABS: 0.8 10*3/uL (ref 0.7–4.0)
Lymphocytes Relative: 5 %
MCH: 27.4 pg (ref 26.0–34.0)
MCHC: 33.7 g/dL (ref 30.0–36.0)
MCV: 81.4 fL (ref 78.0–100.0)
MONOS PCT: 5 %
Monocytes Absolute: 0.8 10*3/uL (ref 0.1–1.0)
Neutro Abs: 14 10*3/uL — ABNORMAL HIGH (ref 1.7–7.7)
Neutrophils Relative %: 90 %
PLATELETS: 212 10*3/uL (ref 150–400)
RBC: 5 MIL/uL (ref 4.22–5.81)
RDW: 15.6 % — ABNORMAL HIGH (ref 11.5–15.5)
WBC: 15.6 10*3/uL — ABNORMAL HIGH (ref 4.0–10.5)

## 2017-02-19 LAB — GLUCOSE, CAPILLARY
Glucose-Capillary: 78 mg/dL (ref 65–99)
Glucose-Capillary: 84 mg/dL (ref 65–99)
Glucose-Capillary: 118 mg/dL — ABNORMAL HIGH (ref 65–99)

## 2017-02-19 LAB — PHOSPHORUS: Phosphorus: 4.8 mg/dL — ABNORMAL HIGH (ref 2.5–4.6)

## 2017-02-19 LAB — BRAIN NATRIURETIC PEPTIDE: B NATRIURETIC PEPTIDE 5: 1198.6 pg/mL — AB (ref 0.0–100.0)

## 2017-02-19 LAB — TROPONIN I: Troponin I: <0.03 ng/mL (ref <0.03)

## 2017-02-19 LAB — HEMOGLOBIN AND HEMATOCRIT, BLOOD
HCT: 42.4 % (ref 39.0–52.0)
HEMOGLOBIN: 14 g/dL (ref 13.0–17.0)

## 2017-02-19 LAB — MAGNESIUM: Magnesium: 2.2 mg/dL (ref 1.7–2.4)

## 2017-02-19 SURGERY — NEPHRECTOMY, PARTIAL, ROBOT-ASSISTED
Anesthesia: General | Site: Abdomen | Laterality: Right

## 2017-02-19 MED ORDER — FENTANYL CITRATE (PF) 250 MCG/5ML IJ SOLN
INTRAMUSCULAR | Status: AC
  Filled 2017-02-19: qty 5

## 2017-02-19 MED ORDER — LEVOTHYROXINE SODIUM 100 MCG PO TABS
100.0000 ug | ORAL_TABLET | Freq: Every day | ORAL | Status: DC
  Administered 2017-02-20 – 2017-02-25 (×6): 100 ug via ORAL
  Filled 2017-02-19 (×6): qty 1

## 2017-02-19 MED ORDER — SODIUM BICARBONATE 8.4 % IV SOLN
INTRAVENOUS | Status: AC
  Filled 2017-02-19: qty 50

## 2017-02-19 MED ORDER — PROPOFOL 10 MG/ML IV BOLUS
INTRAVENOUS | Status: DC | PRN
Start: 2017-02-19 — End: 2017-02-19
  Administered 2017-02-19: 140 mg via INTRAVENOUS

## 2017-02-19 MED ORDER — STERILE WATER FOR IRRIGATION IR SOLN
Status: DC | PRN
  Administered 2017-02-19: 1000 mL

## 2017-02-19 MED ORDER — ACETAMINOPHEN 500 MG PO TABS
1000.0000 mg | ORAL_TABLET | ORAL | Status: AC
  Administered 2017-02-19: 1000 mg via ORAL
  Filled 2017-02-19: qty 2

## 2017-02-19 MED ORDER — FENTANYL CITRATE (PF) 100 MCG/2ML IJ SOLN
INTRAMUSCULAR | Status: AC
  Filled 2017-02-19: qty 2

## 2017-02-19 MED ORDER — FENTANYL CITRATE (PF) 100 MCG/2ML IJ SOLN
INTRAMUSCULAR | Status: DC | PRN
  Administered 2017-02-19: 50 ug via INTRAVENOUS
  Administered 2017-02-19: 100 ug via INTRAVENOUS
  Administered 2017-02-19: 50 ug via INTRAVENOUS
  Administered 2017-02-19: 100 ug via INTRAVENOUS
  Administered 2017-02-19: 50 ug via INTRAVENOUS
  Administered 2017-02-19: 100 ug via INTRAVENOUS
  Administered 2017-02-19: 50 ug via INTRAVENOUS

## 2017-02-19 MED ORDER — FENOFIBRATE 54 MG PO TABS
54.0000 mg | ORAL_TABLET | Freq: Every day | ORAL | Status: DC
  Administered 2017-02-20 – 2017-02-25 (×6): 54 mg via ORAL
  Filled 2017-02-19 (×7): qty 1

## 2017-02-19 MED ORDER — FUROSEMIDE 10 MG/ML IJ SOLN
INTRAMUSCULAR | Status: AC
  Filled 2017-02-19: qty 2

## 2017-02-19 MED ORDER — FENTANYL CITRATE (PF) 100 MCG/2ML IJ SOLN
25.0000 ug | INTRAMUSCULAR | Status: DC | PRN
  Administered 2017-02-19: 25 ug via INTRAVENOUS

## 2017-02-19 MED ORDER — ONDANSETRON HCL 4 MG/2ML IJ SOLN: 4.0000 mg | INTRAMUSCULAR | Status: DC | PRN

## 2017-02-19 MED ORDER — HYDROMORPHONE HCL 1 MG/ML IJ SOLN: 0.5000 mg | INTRAMUSCULAR | Status: DC | PRN

## 2017-02-19 MED ORDER — BUPIVACAINE-EPINEPHRINE 0.25% -1:200000 IJ SOLN
INTRAMUSCULAR | Status: DC | PRN
  Administered 2017-02-19: 20 mL

## 2017-02-19 MED ORDER — FUROSEMIDE 10 MG/ML IJ SOLN
80.0000 mg | Freq: Three times a day (TID) | INTRAMUSCULAR | Status: DC
  Administered 2017-02-19 – 2017-02-21 (×4): 80 mg via INTRAVENOUS
  Filled 2017-02-19 (×4): qty 8

## 2017-02-19 MED ORDER — BUPIVACAINE LIPOSOME 1.3 % IJ SUSP
INTRAMUSCULAR | Status: AC
  Filled 2017-02-19: qty 20

## 2017-02-19 MED ORDER — EZETIMIBE 10 MG PO TABS
10.0000 mg | ORAL_TABLET | Freq: Every day | ORAL | Status: DC
Start: 2017-02-19 — End: 2017-02-25
  Administered 2017-02-20 – 2017-02-25 (×6): 10 mg via ORAL
  Filled 2017-02-19 (×7): qty 1

## 2017-02-19 MED ORDER — GLYCOPYRROLATE 0.2 MG/ML IV SOSY
PREFILLED_SYRINGE | INTRAVENOUS | Status: DC | PRN
  Administered 2017-02-19 (×2): .2 mg via INTRAVENOUS
  Administered 2017-02-19: .1 mg via INTRAVENOUS

## 2017-02-19 MED ORDER — SUCCINYLCHOLINE CHLORIDE 20 MG/ML IJ SOLN
INTRAMUSCULAR | Status: DC | PRN
  Administered 2017-02-19: 120 mg via INTRAVENOUS

## 2017-02-19 MED ORDER — LABETALOL HCL 5 MG/ML IV SOLN
INTRAVENOUS | Status: AC
  Administered 2017-02-19: 5 mg
  Filled 2017-02-19: qty 4

## 2017-02-19 MED ORDER — CLONIDINE HCL 0.1 MG PO TABS
0.1000 mg | ORAL_TABLET | Freq: Two times a day (BID) | ORAL | Status: DC
Start: 2017-02-19 — End: 2017-02-25
  Administered 2017-02-20 – 2017-02-25 (×11): 0.1 mg via ORAL
  Filled 2017-02-19 (×11): qty 1

## 2017-02-19 MED ORDER — NADOLOL 40 MG PO TABS
40.0000 mg | ORAL_TABLET | Freq: Every day | ORAL | Status: DC
  Administered 2017-02-20 – 2017-02-25 (×5): 40 mg via ORAL
  Filled 2017-02-19 (×6): qty 1

## 2017-02-19 MED ORDER — CEFAZOLIN SODIUM-DEXTROSE 2-4 GM/100ML-% IV SOLN
INTRAVENOUS | Status: AC
  Filled 2017-02-19: qty 100

## 2017-02-19 MED ORDER — LACTATED RINGERS IR SOLN
Status: DC | PRN
  Administered 2017-02-19: 1000 mL

## 2017-02-19 MED ORDER — TRAMADOL HCL 50 MG PO TABS: 50.0000 mg | ORAL_TABLET | Freq: Four times a day (QID) | ORAL | 0 refills | Status: DC | PRN

## 2017-02-19 MED ORDER — ACETAMINOPHEN 10 MG/ML IV SOLN
1000.0000 mg | Freq: Four times a day (QID) | INTRAVENOUS | Status: AC
  Administered 2017-02-19 – 2017-02-20 (×4): 1000 mg via INTRAVENOUS
  Filled 2017-02-19 (×4): qty 100

## 2017-02-19 MED ORDER — LABETALOL HCL 5 MG/ML IV SOLN
5.0000 mg | Freq: Once | INTRAVENOUS | Status: AC
  Administered 2017-02-19: 5 mg via INTRAVENOUS

## 2017-02-19 MED ORDER — BUPIVACAINE-EPINEPHRINE 0.25% -1:200000 IJ SOLN
INTRAMUSCULAR | Status: AC
  Filled 2017-02-19: qty 1

## 2017-02-19 MED ORDER — DIPHENHYDRAMINE HCL 12.5 MG/5ML PO ELIX: 12.5000 mg | ORAL_SOLUTION | Freq: Four times a day (QID) | ORAL | Status: DC | PRN

## 2017-02-19 MED ORDER — SODIUM CHLORIDE 0.45 % IV SOLN
INTRAVENOUS | Status: DC
  Administered 2017-02-19 – 2017-02-22 (×3): via INTRAVENOUS

## 2017-02-19 MED ORDER — ROCURONIUM BROMIDE 10 MG/ML (PF) SYRINGE
PREFILLED_SYRINGE | INTRAVENOUS | Status: DC | PRN
  Administered 2017-02-19: 50 mg via INTRAVENOUS
  Administered 2017-02-19: 10 mg via INTRAVENOUS
  Administered 2017-02-19: 20 mg via INTRAVENOUS
  Administered 2017-02-19: 10 mg via INTRAVENOUS

## 2017-02-19 MED ORDER — ORAL CARE MOUTH RINSE
15.0000 mL | Freq: Two times a day (BID) | OROMUCOSAL | Status: DC
  Administered 2017-02-19 – 2017-02-24 (×8): 15 mL via OROMUCOSAL

## 2017-02-19 MED ORDER — INSULIN ASPART 100 UNIT/ML ~~LOC~~ SOLN
0.0000 [IU] | Freq: Three times a day (TID) | SUBCUTANEOUS | Status: DC
  Administered 2017-02-20: 3 [IU] via SUBCUTANEOUS
  Administered 2017-02-20: 15 [IU] via SUBCUTANEOUS
  Administered 2017-02-20: 11 [IU] via SUBCUTANEOUS
  Administered 2017-02-21: 5 [IU] via SUBCUTANEOUS
  Administered 2017-02-21: 3 [IU] via SUBCUTANEOUS
  Administered 2017-02-21 – 2017-02-22 (×3): 5 [IU] via SUBCUTANEOUS
  Administered 2017-02-22 – 2017-02-23 (×4): 3 [IU] via SUBCUTANEOUS
  Administered 2017-02-24: 2 [IU] via SUBCUTANEOUS
  Administered 2017-02-24: 3 [IU] via SUBCUTANEOUS
  Administered 2017-02-24 – 2017-02-25 (×2): 2 [IU] via SUBCUTANEOUS

## 2017-02-19 MED ORDER — LACTATED RINGERS IV SOLN
INTRAVENOUS | Status: DC | PRN
Start: 2017-02-19 — End: 2017-02-19
  Administered 2017-02-19: 13:00:00 via INTRAVENOUS

## 2017-02-19 MED ORDER — BENAZEPRIL HCL 10 MG PO TABS
40.0000 mg | ORAL_TABLET | Freq: Every day | ORAL | Status: DC
  Administered 2017-02-20 – 2017-02-25 (×6): 40 mg via ORAL
  Filled 2017-02-19: qty 1
  Filled 2017-02-19 (×4): qty 4
  Filled 2017-02-19: qty 1
  Filled 2017-02-19: qty 4

## 2017-02-19 MED ORDER — AMLODIPINE BESYLATE 5 MG PO TABS
5.0000 mg | ORAL_TABLET | Freq: Every day | ORAL | Status: DC
Start: 2017-02-20 — End: 2017-02-25
  Administered 2017-02-20 – 2017-02-25 (×6): 5 mg via ORAL
  Filled 2017-02-19 (×6): qty 1

## 2017-02-19 MED ORDER — SUGAMMADEX SODIUM 200 MG/2ML IV SOLN
INTRAVENOUS | Status: DC | PRN
  Administered 2017-02-19: 200 mg via INTRAVENOUS

## 2017-02-19 MED ORDER — EPHEDRINE SULFATE-NACL 50-0.9 MG/10ML-% IV SOSY
PREFILLED_SYRINGE | INTRAVENOUS | Status: DC | PRN
  Administered 2017-02-19: 10 mg via INTRAVENOUS

## 2017-02-19 MED ORDER — LACTATED RINGERS IV SOLN
INTRAVENOUS | Status: DC
Start: 2017-02-19 — End: 2017-02-19
  Administered 2017-02-19 (×3): via INTRAVENOUS

## 2017-02-19 MED ORDER — OXYCODONE HCL 5 MG PO TABS
5.0000 mg | ORAL_TABLET | ORAL | Status: DC | PRN
  Administered 2017-02-21: 5 mg via ORAL
  Filled 2017-02-19: qty 1

## 2017-02-19 MED ORDER — FUROSEMIDE 10 MG/ML IJ SOLN
40.0000 mg | Freq: Once | INTRAMUSCULAR | Status: AC
  Administered 2017-02-19: 40 mg via INTRAVENOUS

## 2017-02-19 MED ORDER — CEFAZOLIN SODIUM-DEXTROSE 2-4 GM/100ML-% IV SOLN
2.0000 g | INTRAVENOUS | Status: AC
  Administered 2017-02-19: 2 g via INTRAVENOUS
  Filled 2017-02-19: qty 100

## 2017-02-19 MED ORDER — GABAPENTIN 300 MG PO CAPS
300.0000 mg | ORAL_CAPSULE | ORAL | Status: AC
  Administered 2017-02-19: 300 mg via ORAL
  Filled 2017-02-19: qty 1

## 2017-02-19 MED ORDER — ROSUVASTATIN CALCIUM 20 MG PO TABS
20.0000 mg | ORAL_TABLET | Freq: Every day | ORAL | Status: DC
  Administered 2017-02-20 – 2017-02-25 (×6): 20 mg via ORAL
  Filled 2017-02-19 (×6): qty 1

## 2017-02-19 MED ORDER — SODIUM BICARBONATE 8.4 % IV SOLN: 50.0000 meq | Freq: Once | INTRAVENOUS | Status: DC

## 2017-02-19 MED ORDER — DIPHENHYDRAMINE HCL 50 MG/ML IJ SOLN: 12.5000 mg | Freq: Four times a day (QID) | INTRAMUSCULAR | Status: DC | PRN

## 2017-02-19 MED ORDER — BUPIVACAINE LIPOSOME 1.3 % IJ SUSP
INTRAMUSCULAR | Status: DC | PRN
  Administered 2017-02-19: 20 mL

## 2017-02-19 SURGICAL SUPPLY — 88 items
APPLICATOR ARISTA FLEXITIP XL (MISCELLANEOUS) IMPLANT
APPLICATOR SURGIFLO ENDO (HEMOSTASIS) IMPLANT
APPLIER CLIP 5 13 M/L LIGAMAX5 (MISCELLANEOUS)
APPLIER CLIP ROT 10 11.4 M/L (STAPLE)
BLADE SURG SZ11 CARB STEEL (BLADE) ×4 IMPLANT
CHLORAPREP W/TINT 26ML (MISCELLANEOUS) ×8 IMPLANT
CLIP APPLIE 5 13 M/L LIGAMAX5 (MISCELLANEOUS) IMPLANT
CLIP APPLIE ROT 10 11.4 M/L (STAPLE) IMPLANT
CLIP LIGATING HEM O LOK PURPLE (MISCELLANEOUS) ×4 IMPLANT
CLIP LIGATING HEMO LOK XL GOLD (MISCELLANEOUS) IMPLANT
CLIP LIGATING HEMO O LOK GREEN (MISCELLANEOUS) ×8 IMPLANT
COVER SURGICAL LIGHT HANDLE (MISCELLANEOUS) ×4 IMPLANT
COVER TIP SHEARS 8 DVNC (MISCELLANEOUS) ×4 IMPLANT
COVER TIP SHEARS 8MM DA VINCI (MISCELLANEOUS) ×4
DECANTER SPIKE VIAL GLASS SM (MISCELLANEOUS) ×8 IMPLANT
DERMABOND ADVANCED (GAUZE/BANDAGES/DRESSINGS) ×2
DERMABOND ADVANCED .7 DNX12 (GAUZE/BANDAGES/DRESSINGS) ×2 IMPLANT
DEVICE TROCAR PUNCTURE CLOSURE (ENDOMECHANICALS) IMPLANT
DRAIN CHANNEL 15F RND FF 3/16 (WOUND CARE) ×4 IMPLANT
DRAPE ARM DVNC X/XI (DISPOSABLE) ×16 IMPLANT
DRAPE COLUMN DVNC XI (DISPOSABLE) ×4 IMPLANT
DRAPE DA VINCI XI ARM (DISPOSABLE) ×16
DRAPE DA VINCI XI COLUMN (DISPOSABLE) ×4
DRAPE INCISE IOBAN 66X45 STRL (DRAPES) ×4 IMPLANT
DRAPE SHEET LG 3/4 BI-LAMINATE (DRAPES) ×4 IMPLANT
DRSG TEGADERM 4X4.75 (GAUZE/BANDAGES/DRESSINGS) ×4 IMPLANT
ELECT PENCIL ROCKER SW 15FT (MISCELLANEOUS) ×4 IMPLANT
ELECT REM PT RETURN 15FT ADLT (MISCELLANEOUS) ×4 IMPLANT
ELECT REM PT RETURN 9FT ADLT (ELECTROSURGICAL) ×8
ELECTRODE REM PT RTRN 9FT ADLT (ELECTROSURGICAL) ×4 IMPLANT
EVACUATOR SILICONE 100CC (DRAIN) ×4 IMPLANT
GAUZE SPONGE 2X2 8PLY STRL LF (GAUZE/BANDAGES/DRESSINGS) ×2 IMPLANT
GAUZE SPONGE 4X4 16PLY XRAY LF (GAUZE/BANDAGES/DRESSINGS) ×4 IMPLANT
GLOVE BIO SURGEON STRL SZ 6.5 (GLOVE) ×3 IMPLANT
GLOVE BIO SURGEONS STRL SZ 6.5 (GLOVE) ×1
GLOVE BIOGEL M STRL SZ7.5 (GLOVE) ×8 IMPLANT
GLOVE BIOGEL PI IND STRL 8 (GLOVE) ×4 IMPLANT
GLOVE BIOGEL PI INDICATOR 8 (GLOVE) ×4
GLOVE ECLIPSE 8.0 STRL XLNG CF (GLOVE) ×8 IMPLANT
GOWN STRL REUS W/TWL LRG LVL3 (GOWN DISPOSABLE) ×12 IMPLANT
GOWN STRL REUS W/TWL XL LVL3 (GOWN DISPOSABLE) ×8 IMPLANT
HEMOSTAT ARISTA ABSORB 3G PWDR (MISCELLANEOUS) IMPLANT
IRRIG SUCT STRYKERFLOW 2 WTIP (MISCELLANEOUS)
IRRIGATION SUCT STRKRFLW 2 WTP (MISCELLANEOUS) IMPLANT
KIT BASIN OR (CUSTOM PROCEDURE TRAY) ×8 IMPLANT
LOOP VESSEL MAXI BLUE (MISCELLANEOUS) ×4 IMPLANT
MARKER SKIN DUAL TIP RULER LAB (MISCELLANEOUS) ×4 IMPLANT
NEEDLE HYPO 22GX1.5 SAFETY (NEEDLE) ×4 IMPLANT
NEEDLE INSUFFLATION 14GA 120MM (NEEDLE) ×4 IMPLANT
NS IRRIG 1000ML POUR BTL (IV SOLUTION) ×4 IMPLANT
PACK CARDIOVASCULAR III (CUSTOM PROCEDURE TRAY) ×4 IMPLANT
PAD POSITIONING PINK XL (MISCELLANEOUS) ×4 IMPLANT
PORT ACCESS TROCAR AIRSEAL 12 (TROCAR) ×2 IMPLANT
PORT ACCESS TROCAR AIRSEAL 5M (TROCAR) ×2
POSITIONER SURGICAL ARM (MISCELLANEOUS) ×8 IMPLANT
POUCH SPECIMEN RETRIEVAL 10MM (ENDOMECHANICALS) ×4 IMPLANT
SCISSORS LAP 5X35 DISP (ENDOMECHANICALS) IMPLANT
SEAL CANN UNIV 5-8 DVNC XI (MISCELLANEOUS) ×16 IMPLANT
SEAL XI 5MM-8MM UNIVERSAL (MISCELLANEOUS) ×16
SEALER VESSEL DA VINCI XI (MISCELLANEOUS)
SEALER VESSEL EXT DVNC XI (MISCELLANEOUS) IMPLANT
SET TRI-LUMEN FLTR TB AIRSEAL (TUBING) ×4 IMPLANT
SOLUTION ELECTROLUBE (MISCELLANEOUS) ×8 IMPLANT
SPOGE SURGIFLO 8M (HEMOSTASIS)
SPONGE GAUZE 2X2 STER 10/PKG (GAUZE/BANDAGES/DRESSINGS) ×2
SPONGE SURGIFLO 8M (HEMOSTASIS) IMPLANT
STAPLER VISISTAT 35W (STAPLE) IMPLANT
SURGIFLO W/THROMBIN 8M KIT (HEMOSTASIS) ×4 IMPLANT
SUT ETHILON 3 0 PS 1 (SUTURE) ×4 IMPLANT
SUT MNCRL AB 4-0 PS2 18 (SUTURE) ×12 IMPLANT
SUT PROLENE 2 0 KS (SUTURE) ×4 IMPLANT
SUT V-LOC BARB 180 2/0GR6 GS22 (SUTURE) ×8
SUT VIC AB 0 CT1 27 (SUTURE) ×6
SUT VIC AB 0 CT1 27XBRD ANTBC (SUTURE) ×6 IMPLANT
SUT VICRYL 0 UR6 27IN ABS (SUTURE) IMPLANT
SUT VLOC BARB 180 ABS3/0GR12 (SUTURE) ×8
SUTURE V-LC BRB 180 2/0GR6GS22 (SUTURE) ×4 IMPLANT
SUTURE VLOC BRB 180 ABS3/0GR12 (SUTURE) ×4 IMPLANT
SYR CONTROL 10ML LL (SYRINGE) ×4 IMPLANT
TOWEL OR 17X26 10 PK STRL BLUE (TOWEL DISPOSABLE) ×8 IMPLANT
TOWEL OR NON WOVEN STRL DISP B (DISPOSABLE) ×8 IMPLANT
TRAY FOLEY W/METER SILVER 16FR (SET/KITS/TRAYS/PACK) ×4 IMPLANT
TRAY LAPAROSCOPIC (CUSTOM PROCEDURE TRAY) ×4 IMPLANT
TROCAR ADV FIXATION 5X100MM (TROCAR) IMPLANT
TROCAR BLADELESS OPT 5 100 (ENDOMECHANICALS) IMPLANT
TROCAR XCEL 12X100 BLDLESS (ENDOMECHANICALS) IMPLANT
TUBING INSUFFLATION 10FT LAP (TUBING) ×4 IMPLANT
WATER STERILE IRR 1500ML POUR (IV SOLUTION) ×4 IMPLANT

## 2017-02-19 NOTE — Assessment & Plan Note (Signed)
Plan f/u pathology Post-op care per urology service.

## 2017-02-19 NOTE — Consult Note (Signed)
PULMONARY / CRITICAL CARE MEDICINE   Name: [name] MRN: [id number] DOB: Jan 10, 1932    ADMISSION DATE:  02/19/2017 CONSULTATION DATE:  3/16   REFERRING MD:  Johney [place]  CHIEF COMPLAINT: hypercarbic respiratory failure   HISTORY OF PRESENT ILLNESS:   This is a 83 year old aam w/ sig h/o right renal mass & chronic calculus cholecystitis and also CAD (w/ prior CABG).  Now s/p elective robotic assisted partial right Nephrectomy and robotic assisted laparoscopic cholecystectomy on 3/16. He was extubated after 4h of anesthesia time at approx 16.40 on 02/19/2017 . Then found to be hyxpoxemic and stil drowsly CXR suggested acute pulm edema and given lasix ad labetealol with reported imrpovement. PCCM asked to assist w/ care for acute post op hypoxemic resp failure  2011 echo with ef 60% and diast dsfn + Jan 2018 [facility] med stress test - normal but ef 44%  PAST MEDICAL HISTORY :  He  has a past medical history of Chronic kidney disease; Coronary artery disease; Diabetes mellitus; Hypertension; Myocardial infarction; and Right renal mass.  PAST SURGICAL HISTORY: He  has a past surgical history that includes Coronary artery bypass graft; Hernia repair; doppler echocardiography (2011); Cardiac catheterization (1996); and ir generic historical (12/09/2016).  No Known Allergies  No current facility-administered medications on file prior to encounter.    Current Outpatient Prescriptions on File Prior to Encounter  Medication Sig  . amLODipine (NORVASC) 5 MG tablet Take 5 mg by mouth daily.   Marland Kitchen aspirin 325 MG EC tablet Take 325 mg by mouth daily.  . Choline Fenofibrate (TRILIPIX) 45 MG capsule Take 45 mg by mouth daily.  . cloNIDine (CATAPRES) 0.1 MG tablet Take 0.1 mg by mouth 2 (two) times daily.  Marland Kitchen ezetimibe (ZETIA) 10 MG tablet Take 10 mg by mouth daily.  Marland Kitchen HUMALOG KWIKPEN 100 UNIT/ML KiwkPen Inject 10 Units into the skin daily with supper.   Marland Kitchen LEVEMIR 100 UNIT/ML injection Inject 30 Units into the  skin 2 (two) times daily.   . nadolol (CORGARD) 40 MG tablet Take 40 mg by mouth daily.  . rosuvastatin (CRESTOR) 20 MG tablet Take 20 mg by mouth daily.  Marland Kitchen SYNTHROID 100 MCG tablet Take 100 mcg by mouth daily.   Marland Kitchen oxyCODONE (ROXICODONE) 5 MG immediate release tablet Take 1 tablet (5 mg total) by mouth every 4 (four) hours as needed for severe pain. (Patient not taking: Reported on 02/10/2017)    FAMILY HISTORY:  His indicated that the status of his mother is unknown. He indicated that the status of his sister is unknown.    SOCIAL HISTORY: He  reports that he quit smoking about 20 years ago. He has never used smokeless tobacco. He reports that he does not drink alcohol or use drugs.   VITAL SIGNS: BP (!) 185/80   Pulse (!) 54   Temp 97.3 F (36.3 C)   Resp 16   Ht 5\' 9"  (1.753 m)   Wt 198 lb (89.8 kg)   SpO2 97%   BMI 29.24 kg/m   HEMODYNAMICS:    VENTILATOR SETTINGS:    INTAKE / OUTPUT: No intake/output data recorded.  PHYSICAL EXAMINATION:  EXAM  General Appearance:    Looks criticall ill Overwegiht +  Head:    Normocephalic, without obvious abnormality, atraumatic  Eyes:    PERRL - yes, conjunctiva/corneas - clear      Ears:    Normal external ear canals, both ears  Nose:   NG tube - no  Throat:  ETT TUBE - no , OG tube - no  Neck:   Supple,  No enlargement/tenderness/nodules     Lungs:     Clear to auscultation bilaterally but some distant crackles +  Chest wall:    No deformity but has CABG scare +  Heart:    S1 and S2 normal, no murmur, CVP - na.  Pressors - no  Abdomen:     Soft, no masses, no organomegaly bu has RLQ drain + and incision of lap chole  Genitalia:    Not done  Rectal:   not done  Extremities:   Extremities- no edema     Skin:   Intact in exposed areas . Sacral area - reportedly intact     Neurologic:   Sedation - post anesthesia extubation x 2h -> RASS - -2  . Moves all 4s - yes. CAM-ICU - not tested . Orientation - seems aware of  surruodnings       LABS  PULMONARY No results for input(s): PHART, PCO2ART, PO2ART, HCO3, TCO2, O2SAT in the last 168 hours.  Invalid input(s): PCO2, PO2  CBC  Recent Labs Lab 02/15/17 1119 02/19/17 1740  HGB 13.5 14.0  HCT 40.2 42.4  WBC 8.6  --   PLT 205  --     COAGULATION No results for input(s): INR in the last 168 hours.  CARDIAC  No results for input(s): TROPONINI in the last 168 hours. No results for input(s): PROBNP in the last 168 hours.   CHEMISTRY  Recent Labs Lab 02/15/17 1119  NA 140  K 4.0  CL 110  CO2 24  GLUCOSE 189*  BUN 17  CREATININE 1.47*  CALCIUM 9.0   Estimated Creatinine Clearance: 42.9 mL/min (A) (by C-G formula based on SCr of 1.47 mg/dL (H)).   LIVER No results for input(s): AST, ALT, ALKPHOS, BILITOT, PROT, ALBUMIN, INR in the last 168 hours.   INFECTIOUS No results for input(s): LATICACIDVEN, PROCALCITON in the last 168 hours.   ENDOCRINE CBG (last 3)   Recent Labs  02/19/17 0837 02/19/17 1715  GLUCAP 78 84         IMAGING x48h  - image(s) personally visualized  -   highlighted in bold No results found. but I personally visualie 17.30 cxr and shows acute pulm edema ++     ASSESSMENT and PLAN  Cholecystitis s/p robotic assisted laporocopic Cholecystectomy 3/16 Plan Post-op care per surgical team NPO   H/O partial nephrectomy for renal mass Plan f/u pathology Post-op care per urology service.   Diabetes ([place]) Plan ssi   CKD (chronic kidney disease) stage 3 w/ baseline cr ~1.49 Plan MAP goal > 65 Renal dose meds Strict I&O f/u am chemistry   CAD (coronary artery disease), HTN and HLD Plan Tele f/u CEs Re-assess home meds in am:  Lasix 40mg /d, Norvasc 5mg /d, asa 325/d, lotensin 40mg /d, zetia 10mg /d, corgard 40mg /d and crestor 20mg /d  Acute postoperative respiratory failure (HCC) Features c/w acute post op acute pulmonary edema' Neednig face mask o2 with still post anesthesia  drowsiness  Plan STat BipaP continupuis sTart aggressve lasix Rule out MI Check ECHO Check PM stat labs cbc, bmet, mag, phos, bnp. Troponin Check ABG  - itnubate if needed or worse      FAMILY  - Updates: 02/19/2017 --> none at [facility]  - Inter-disciplinary family meet or Palliative Care meeting due by:  DAy 7. Current LOS is LOS 0 days  CODE STATUS Code Status History  Date Active Date Inactive Code Status Order ID Comments User Context   11/18/2016  4:21 PM 11/20/2016  3:19 PM Full Code [id number]  Radene Gunning, NP ED   05/12/2013  7:51 PM 05/14/2013  2:18 PM Full Code [id number]  Jonetta Osgood, MD Inpatient   01/14/2012 10:48 PM 01/16/2012  8:08 PM Full Code [id number]  Lenetta Quaker, RN Inpatient        DISPO Keep in ICU       The patient is critically ill with multiple organ systems failure and requires high complexity decision making for assessment and support, frequent evaluation and titration of therapies, application of advanced monitoring technologies and extensive interpretation of multiple databases.   Critical Care Time devoted to patient care services described in this note is  30  Minutes. This time reflects time of care of this signee Dr Brand Males. This critical care time does not reflect procedure time, or teaching time or supervisory time of PA/NP/Med student/Med Resident etc but could involve care discussion time    Dr. Brand Males, M.D., [facility].C.P Pulmonary and Critical Care Medicine Staff Physician [facility] Pager: [phone], If no answer or between  15:00h - 7:00h: call [phone]  02/19/2017 6:45 PM

## 2017-02-19 NOTE — Progress Notes (Signed)
Post-op hypoxia/hybercarbia.  CXR showed pulmonary edema.  Given an amp of bicarb and 40mg  of IV lasix.  Things looked to be improving from pulmonary standpoint.  Discussed ICU admission with Dr. Rosana Berger.  He recommended BiPap.  At the time of his transfer to the ICU his oxygenation seemed to be adequate and  Thus BiBap was not started.

## 2017-02-19 NOTE — Assessment & Plan Note (Signed)
Plan Post-op care per surgical team NPO

## 2017-02-19 NOTE — Op Note (Signed)
Preoperative diagnosis:  1. right renal mass   Postoperative diagnosis:  1. same   Procedure: 1. Robotic assisted laparoscopic right partial nephrectomy  Surgeon: [name], MD 1st assistant: Debbrah Alar, PA  Anesthesia: General  Complications: None  Intraoperative findings:  #1. Extensive geroda's fat layer with sticky fat overlying kidney and tumor #2. Warm ischemia time 22 minutes  EBL: 400cc  Specimens: right renal mass, right tumor base margins   Indication:  [name] is a 82 y.o. patient with right renal mass.  After reviewing the management options for treatment, he elected to proceed with the above surgical procedure(s). We have discussed the potential benefits and risks of the procedure, side effects of the proposed treatment, the likelihood of the patient achieving the goals of the procedure, and any potential problems that might occur during the procedure or recuperation. Informed consent has been obtained.   Description of procedure:  An assistant was required for this surgical procedure.  The duties of the assistant included but were not limited to suctioning, passing suture, camera manipulation, retraction. This procedure would not be able to be performed without an Environmental consultant.  The patient was taken to the operating room and a general anesthetic was administered. The patient was given preoperative antibiotics, placed in the right modified flank position with care to pad all potential pressure points, and prepped and draped in the usual sterile fashion. Next a preoperative timeout was performed.  A varess needle was used to gain access into the peritoneum making a small subcoastal incision. The camera port was placed lateral to the umbilicus.  We entered the peritoneum without incident and established pneumoperitoneum. The camera was then used to inspect the abdomen and there was no evidence of any intra-abdominal injuries or other abnormalities. The  remaining abdominal ports were then placed. 8 mm robotic ports were placed in the right upper quadrant, right lower quadrant, and far right lateral abdominal wall. A 12 mm port was placed in the upper midline for laparoscopic assistance. Lysis of adhesions were performed at the midline to allow placement of the 12 mm assistant port. All ports were placed under direct vision without difficulty. The surgical cart was then docked.   Utilizing the cautery scissors, the white line of [place] was incised allowing the colon to be mobilized medially and the plane between the mesocolon and the anterior layer of Gerota's fascia to be developed and the kidney to be exposed. The ureter and gonadal vein were identified inferiorly and the ureter was lifted anteriorly off the psoas muscle. Dissection proceeded superiorly along the gonadal vein until the renal vein was identified. The renal hilum was then carefully isolated with a combination of blunt and sharp dissection allowing the renal arterial and venous structures to be separated and isolated in preparation for renal hilar vessel clamping.  Attention turned to the kidney and the perinephric fat surrounding the renal mass was removed and the kidney was mobilized sufficiently for exposure and resection of the renal mass. The margins were then demarcated using intraoperative ultrasound.  Once the renal mass was properly isolated, preparations were made for resection of the tumor. The renal artery was then clamped with a bulldog clamp. The tumor was then excised with cold scissor dissection along with an adequate visible gross margin of normal renal parenchyma. The tumor appeared to be excised without any gross violation of the tumor. The renal collecting system was not entered during removal of the tumor. A running 3-0 V-lock suture was  then brought through the capsule of the kidney and run along the base of the renal defect to provide hemostasis and close any  entry into the renal collecting system if present. Weck clips were used to secure this suture outside the renal capsule at the proximal and distal ends. The bulldog clamps were then removed from the renal hilar vessel. A running 2-0 V lock suture was then used to close the capsule of the kidney using a sliding clip technique which resulted in excellent hemostasis. An additional hemostatic agent (Surgiflo) was then placed into the renal defect.Surgicel was then placed over the defect.   Total warm renal ischemia time was  22 minutes. The renal tumor resection site was examined. Hemostasis appeared adequate.   The kidney was placed back into its normal anatomic position and covered with perinephric fat as needed. A # 57 Blake drain was then brought through the lateral lower port site and positioned in the perinephric space. It was secured to the skin with a nylon suture. The surgical robotic cart was undocked. The renal tumor specimen was removed intact within an endopouch retrieval bag via the camera port sites. The camera port site and the other 12 mm port site were then closed at the fascial level with 0-vicryl suture. All other laparoscopic/robotic ports were removed under direct vision and the pneumoperitoneum let down with inspection of the operative field performed and hemostasis again confirmed. All incision sites were then injected with local anesthetic and reapproximated at the skin level with 4-0 monocryl subcuticular closures. Dermabond was applied to the skin. The patient tolerated the procedure well and without complications. The patient was able to be extubated and transferred to the recovery unit in satisfactory condition.  [name], M.D.

## 2017-02-19 NOTE — Assessment & Plan Note (Signed)
Plan ssi   

## 2017-02-19 NOTE — Transfer of Care (Signed)
Immediate Anesthesia Transfer of Care Note  Patient: TARQUIN WELCHER  Procedure(s) Performed: Procedure(s): XI ROBOTIC ASSITED PARTIAL NEPHRECTOMY (Right) XI ROBOTIC ASSISTED LAPAROSCOPIC CHOLECYSTECTOMY (N/A)  Patient Location: PACU  Anesthesia Type:General  Level of Consciousness: sedated, patient cooperative and responds to stimulation  Airway & Oxygen Therapy: Patient Spontanous Breathing and Patient connected to face mask oxygen  Post-op Assessment: Report given to RN and Post -op Vital signs reviewed and stable  Post vital signs: Reviewed and stable  Last Vitals:  Vitals:   02/19/17 0841  BP: (!) 183/94  Pulse: (!) 53  Resp: 16  Temp: 36.7 C    Last Pain:  Vitals:   02/19/17 0841  TempSrc: Oral      Patients Stated Pain Goal: 3 (01/74/94 4967)  Complications: No apparent anesthesia complications

## 2017-02-19 NOTE — H&P (Signed)
Renal Mass  HPI: [name] is a 83 year-old male established patient who is here further eval and management of a renal mass.  The mass is on the right side.   The lesion(s) was first noted on 11/18/2016. The mass was seen on MRI Scan.   His symptoms include flank pain and vomiting. Patient denies having back pain, groin pain, nausea, fever, chills, and blood in urine. He has not seen blood in his urine. He does not have a good appetite. He is not having pain in new locations. He has not recently had unwanted weight loss.   He has not had previous abdominal surgery. The patient can walk a flight of steps.   The patient's past medical history is significant for diabetes and heart attack. There is not a a family history of kidney cancer. There is no family history of brain tumors (AMLs), seizures or brain aneurysm's.   The patient presents today to discuss further treatment options for his right renal mass. This was discovered incidentally when he was worked up for his right upper quadrant pain found to have cholecystitis. He has seen interventional radiology and they discussed cryoablation with him. The patient has ongoing right upper quadrant pain and has not seen a general surgeon at this point. He is scheduled to see one next week.   The patient has had heart surgery but it has reasonable exercise tolerance.   Interval: Patient has seen Dr. Johney Maine, recommended combined surgery for cholecystitis and right partial nephrectomy. He feels better - no longer have severe GI distress. he is having some muscle pain in his right side that is worse with twisting/turning. Otherwise he feels well.   He is scheduled for his stress test tomorrow.     ALLERGIES: None   MEDICATIONS: Aspirin  Amlodipine Besylate 5 mg tablet  Benazepril Hcl 40 mg tablet  Clonidine Hcl 0.1 mg tablet  Furosemide 40 mg tablet  Levemir  Madolol  Rosuvastatin Calcium 20 mg tablet  Synthroid  Zetia 10 mg tablet     GU  PSH: None     PSH Notes: heart surgeries (h/o heart attack)- 1997   NON-GU PSH: None   GU PMH: Benign Neo Kidney, Unspec, Right, The exophytic right upper pole lesion is concerning for renal cell carcinoma. The patient is a marginal surgery candidate. However, he may need a cholecystectomy and we could potentially combining these procedure, at which point surgery would make more sense for him. - 12/17/2016      PMH Notes: Kidney failure   NON-GU PMH: Diabetes Type 2 Hypertension    FAMILY HISTORY: 1 son - Son 4 daughters - Daughter   SOCIAL HISTORY: Marital Status: Married Current Smoking Status: Patient does not smoke anymore.   Tobacco Use Assessment Completed: Used Tobacco in last 30 days? Has never drank.  Drinks 1 caffeinated drink per day. Patient's occupation is/was Retired.    REVIEW OF SYSTEMS:    GU Review Male:   Patient denies frequent urination, hard to postpone urination, burning/ pain with urination, get up at night to urinate, leakage of urine, stream starts and stops, trouble starting your stream, have to strain to urinate , erection problems, and penile pain.  Gastrointestinal (Upper):   Patient denies nausea, vomiting, and indigestion/ heartburn.  Gastrointestinal (Lower):   Patient denies diarrhea and constipation.  Constitutional:   Patient denies fever, night sweats, weight loss, and fatigue.  Skin:   Patient denies skin rash/ lesion and itching.  Eyes:  Patient denies blurred vision and double vision.  Ears/ Nose/ Throat:   Patient denies sore throat and sinus problems.  Hematologic/Lymphatic:   Patient denies swollen glands and easy bruising.  Cardiovascular:   Patient denies leg swelling and chest pains.  Respiratory:   Patient denies cough and shortness of breath.  Endocrine:   Patient denies excessive thirst.  Musculoskeletal:   Patient denies back pain and joint pain.  Neurological:   Patient denies headaches and dizziness.  Psychologic:   Patient  denies depression and anxiety.   VITAL SIGNS:      01/04/2017 03:44 PM  Weight 196 lb / 88.9 kg  Height 69 in / 175.26 cm  BP 175/79 mmHg  Pulse 51 /min  BMI 28.9 kg/m   MULTI-SYSTEM PHYSICAL EXAMINATION:    Constitutional: Well-nourished. No physical deformities. Normally developed. Good grooming.  Respiratory: No labored breathing, no use of accessory muscles. CTA-B  Cardiovascular: Normal temperature, normal extremity pulses, no swelling, no varicosities. Sinus Rhythm  Gastrointestinal: No mass, no tenderness, no rigidity, non obese abdomen. Soft and non-tender  Musculoskeletal: Normal gait and station of head and neck.     PAST DATA REVIEWED:  Source Of History:  Patient  X-Ray Review: MRI Abdomen: Reviewed Films.     PROCEDURES:          Urinalysis w/Scope Dipstick Dipstick Cont'd Micro  Color: Yellow Bilirubin: Neg WBC/hpf: NS (Not Seen)  Appearance: Clear Ketones: Neg RBC/hpf: 0 - 2/hpf  Specific Gravity: 1.015 Blood: Trace Bacteria: NS (Not Seen)  pH: 6.5 Protein: 1+ Cystals: NS (Not Seen)  Glucose: Neg Urobilinogen: 0.2 Casts: NS (Not Seen)    Nitrites: Neg Trichomonas: Not Present    Leukocyte Esterase: Neg Mucous: Not Present      Epithelial Cells: NS (Not Seen)      Yeast: NS (Not Seen)      Sperm: Not Present    ASSESSMENT:      ICD-10 Details  1 GU:   Benign Neo Kidney, Unspec - D30.00 The exophytic right upper pole lesion is concerning for renal cell carcinoma.    PLAN:           Schedule Return Visit/Planned Activity: ASAP - Schedule Surgery          Document Letter(s):  Created for Patient: Clinical Summary         Notes:   We again discussed the treatment options and if he passes his stress test, I think it makes sense for [place] to proceed with surgical extirpation in combo with Dr. Johney Maine. I went through the operation with him and his wife, the potential complications associated with it, and the expected hosptial course and post-operative recovery.  We will get this coordinated with Dr. Johney Maine and hopefully take care of this in the next 4 weeks.   All questions answered.

## 2017-02-19 NOTE — Anesthesia Preprocedure Evaluation (Signed)
Anesthesia Evaluation  Patient identified by MRN, date of birth, ID band Patient awake    Reviewed: Allergy & Precautions, NPO status , Patient's Chart, lab work & pertinent test results  Airway Mallampati: II  TM Distance: >3 FB     Dental   Pulmonary former smoker,    breath sounds clear to auscultation       Cardiovascular hypertension, + CAD and + Past MI   Rhythm:Regular Rate:Normal     Neuro/Psych    GI/Hepatic negative GI ROS, Neg liver ROS,   Endo/Other  diabetes  Renal/GU Renal disease     Musculoskeletal   Abdominal   Peds  Hematology   Anesthesia Other Findings   Reproductive/Obstetrics                             Anesthesia Physical Anesthesia Plan  ASA: III  Anesthesia Plan: General   Post-op Pain Management:    Induction: Intravenous  Airway Management Planned: Oral ETT  Additional Equipment:   Intra-op Plan:   Post-operative Plan: Possible Post-op intubation/ventilation  Informed Consent: I have reviewed the patients History and Physical, chart, labs and discussed the procedure including the risks, benefits and alternatives for the proposed anesthesia with the patient or authorized representative who has indicated his/her understanding and acceptance.   Dental advisory given  Plan Discussed with: CRNA and Anesthesiologist  Anesthesia Plan Comments:         Anesthesia Quick Evaluation

## 2017-02-19 NOTE — Anesthesia Procedure Notes (Signed)
Procedure Name: Intubation Performed by: Gean Maidens Pre-anesthesia Checklist: Patient identified, Emergency Drugs available, Suction available, Patient being monitored and Timeout performed Patient Re-evaluated:Patient Re-evaluated prior to inductionOxygen Delivery Method: Circle system utilized Preoxygenation: Pre-oxygenation with 100% oxygen Intubation Type: IV induction Ventilation: Mask ventilation without difficulty Laryngoscope Size: Mac and 4 Grade View: Grade I Tube size: 7.5 mm Number of attempts: 1 Airway Equipment and Method: Stylet Placement Confirmation: ETT inserted through vocal cords under direct vision,  positive ETCO2,  CO2 detector and breath sounds checked- equal and bilateral Secured at: 23 cm Tube secured with: Tape Dental Injury: Teeth and Oropharynx as per pre-operative assessment

## 2017-02-19 NOTE — Interval H&P Note (Signed)
History and Physical Interval Note:  02/19/2017 10:27 AM  [name]  has presented today for surgery, with the diagnosis of RIGHT RENAL MASS,SYMPTOMAIC BILIARY COLIC PROBABLE CHRONIC CHOLECYSTITIS   The various methods of treatment have been discussed with the patient and family. After consideration of risks, benefits and other options for treatment, the patient has consented to  Procedure(s): XI ROBOTIC ASSITED PARTIAL NEPHRECTOMY (Right) XI ROBOTIC ASSISTED LAPAROSCOPIC CHOLECYSTECTOMY WITH IOC 4 PORT LAPAROSCOPIC Reform CHOLANGIOGRAM STANDARD 4 PORT (N/A) as a surgical intervention .  The patient's history has been reviewed, patient examined, no change in status, stable for surgery.  I have reviewed the patient's chart and labs.  Questions were answered to the patient's satisfaction.     [name]

## 2017-02-19 NOTE — Assessment & Plan Note (Addendum)
Plan Tele f/u CEs Re-assess home meds in am:  Lasix 40mg /d, Norvasc 5mg /d, asa 325/d, lotensin 40mg /d, zetia 10mg /d, corgard 40mg /d and crestor 20mg /d

## 2017-02-19 NOTE — Op Note (Signed)
02/19/2017  PATIENT:  [name]  83 y.o. male  Patient Care Team: Anda Kraft, MD as PCP - General (Endocrinology) Michael Boston, MD as Consulting Physician (General Surgery) [name], MD as Attending Physician (Urology) Pixie Casino, MD as Consulting Physician (Cardiology) [name], MD as Consulting Physician (Interventional Radiology)  PRE-OPERATIVE DIAGNOSIS:    [facility] BILIARY COLIC PROBABLE CHRONIC CHOLECYSTITIS   POST-OPERATIVE DIAGNOSIS:   CHRONIC CHOLECYSTITIS  PROCEDURE:    XI ROBOTIC CHOLECYSTECTOMY  SURGEON:  Adin Hector, MD  ASSISTANT: Burman Nieves, MD   ANESTHESIA:   local and general  EBL:  Total I/O In: 1000 [I.V.:1000] Out: 200 [Urine:150; Blood:50]  Delay start of Pharmacological VTE agent (>24hrs) due to surgical blood loss or risk of bleeding:  no  DRAINS: Not at time of completion (anticipate one being place after partial nephrectomy)   SPECIMEN:  Source of Specimen:  Gallbladder  DISPOSITION OF SPECIMEN:  PATHOLOGY  COUNTS:  YES  PLAN OF CARE: Admit for overnight observation  PATIENT DISPOSITION:  PACU - hemodynamically stable.  INDICATION: Patient with biliary colic.  H&P & studies concerning for chronic cholecystitis.  Patient   The anatomy & physiology of hepatobiliary & pancreatic function was discussed.  The pathophysiology of gallbladder dysfunction was discussed.  Natural history risks without surgery was discussed.   I feel the risks of no intervention will lead to serious problems that outweigh the operative risks; therefore, I recommended cholecystectomy to remove the pathology.  I explained laparoscopic techniques with possible need for an open approach.  Probable cholangiogram to evaluate the bilary tract was explained as well.    Risks such as bleeding, infection, abscess, leak, injury to other organs, need for further treatment, heart attack, death, and other risks were discussed.  I noted a good likelihood  this will help address the problem.  Possibility that this will not correct all abdominal symptoms was explained.  Goals of post-operative recovery were discussed as well.  We will work to minimize complications.  An educational handout further explaining the pathology and treatment options was given as well.  Questions were answered.  The patient expresses understanding & wishes to proceed with surgery.    OR FINDINGS: Dense adhesions to chronically threatened retracted gallbladder consistent with significant chronic cholecystitis.  No empyema.  Some fatty change in the liver but no hard evidence of cirrhosis.  DESCRIPTION:   The patient was identified & brought into the operating room. The patient was positioned right side decubitus up an airplane position in anticipation of robotic nephrectomy.  Split with flexed positioning.  An under the care of Dr. Louis Meckel with urology.  SCDs were active during the entire case. The patient underwent general anesthesia without any difficulty.  The abdomen was prepped and draped in a sterile fashion. A Surgical Timeout confirmed our plan.  We positioned the patient in reverse [place] & right side up.   Placed a varies needle and induced insufflation.  We placed an 8 mm port in the right upper quadrant along the subcostal ridge.  Easy water test.  We induced carbon dioxide insufflation. Camera inspection revealed no injury.  There were no adhesions to the anterior abdominal wall supraumbilically.  I proceeded to continue with  robotic technique.  Further robotic and assistant ports are placed in anticipation of the partial nephrectomy.  The excised da Vinci robot was docked appropriately.    I turned attention to the right upper quadrant.    Gallbladder had dense adhesions  of greater omentum and mesocolon and transverse colon and duodenum to it.  These were swept off to expose the fundus of the gallbladder.  The gallbladder fundus was elevated cephalad. I used  hook cautery to free the peritoneal coverings between the gallbladder and the liver on the posteriolateral and anteriomedial walls.   Gallbladder was rather intrahepatic sensitive and time to free it off.  End up having to drain the gallbladder some clear thin bile to help better decompressing  the gallbladder to allow to be grasped.    I used careful blunt and hook dissection to help get a good critical view of the cystic artery and cystic duct. I did further dissection to free a few centimeters of the gallbladder off the liver bed to get a good critical view of the infundibulum and cystic duct. I mobilized the cystic artery; and, after getting a good 360 view, ligated the cystic artery using locking plastic clips.  I skeletonized the cystic duct and ligated it with plastic clips, two on the portal side.  One on the infundibulum.     I completed cystic duct transection. I freed the gallbladder from its remaining attachments to the liver.  It had a very obliterated inflamed plane with the liver bed at the gallbladder fossa.  It going into the liver a little bit to help clear it off.  Took a capsule of liver near the fundus off as well.   I ensured hemostasis on the gallbladder fossa of the liver and elsewhere. I inspected the rest of the abdomen & detected no injury nor bleeding elsewhere.   the gallbladder is placed in an Endo Catch bag.  We did irrigation and inspection.  Hemostasis is good.  The point I turned the case over to Dr. Louis Meckel for the right partial nephrectomy.  Please see his note for further details.   I removed the gallbladder.   I had discussed postoperative care with the patient & family in the holding area.  Instructions are written in the chart as well.  Adin Hector, M.D., F.A.C.S. Gastrointestinal and Minimally Invasive Surgery [facility] [address] [place], [place] [address] [phone] Main / Paging  02/19/2017 2:31 PM

## 2017-02-19 NOTE — Anesthesia Postprocedure Evaluation (Signed)
Anesthesia Post Note  Patient: [name]  Procedure(s) Performed: Procedure(s) (LRB): XI ROBOTIC ASSITED PARTIAL NEPHRECTOMY (Right) XI ROBOTIC ASSISTED LAPAROSCOPIC CHOLECYSTECTOMY (N/A)  Patient location during evaluation: PACU Anesthesia Type: General Level of consciousness: awake and alert Pain management: pain level controlled Vital Signs Assessment: post-procedure vital signs reviewed and stable Respiratory status: spontaneous breathing, nonlabored ventilation, respiratory function stable and patient connected to nasal cannula oxygen Cardiovascular status: blood pressure returned to baseline and stable Postop Assessment: no signs of nausea or vomiting Anesthetic complications: no       Last Vitals:  Vitals:   02/19/17 1830 02/19/17 1843  BP: (!) 164/79 (!) 167/85  Pulse: (!) 53   Resp: 18 11  Temp:      Last Pain:  Vitals:   02/19/17 0841  TempSrc: Oral                 Talis Iwan DAVID

## 2017-02-19 NOTE — H&P (Signed)
[name] 12/22/2016 11:11 AM Location: [facility] Patient #: [id number] DOB: 1932/08/26 Married / Language: English / Race: Black or African American Male  Patient Care Team: Anda Kraft, MD as PCP - General (Endocrinology) Michael Boston, MD as Consulting Physician (General Surgery) [name], MD as Attending Physician (Urology) Pixie Casino, MD as Consulting Physician (Cardiology) [name], MD as Consulting Physician (Interventional Radiology)   History of Present Illness  Patient words: GB.  The patient is a 83 year old male who presents for evaluation of gall stones. Note for "Gall stones": Patient sent for surgical consultation at the request of Dr. Louis Meckel with urology. Concern for biliary colic and probable chronic calculus cholecystitis. Incidental right renal cell cancer. Consideration of combinational robotic surgery.  Pleasant elderly gentleman. Comes today with his wife. History of cardiac disease status post coronary bypass grafting.. Insulin requiring diabetic. Glucoses are under relatively decent control. By Dr. Berdine Addison T with cardiology. His cardiac function and workup in 2016. Overdue follow-up. The patient had an episode of severe right-sided abdominal pain and nausea and vomiting. He went to the hospital a month ago. Increased liver function tests. Concern for possible gastroenteritis. However story seemed to be more suggestive of gallbladder etiology. Ultrasound showed stones and some gallbladder wall thickening. Right upper pole renal mass incidentally noted. MRI confirmed 4 cm lesion suspicious for renal cell cancer. Negative for any cardiac or pulmonary etiology. Consent for urology evaluation. Strongly suspicious for renal cell carcinoma. Options of cryoablation versus robotic partial nephrectomy discussed. Dr. Louis Meckel with Urology d/w me and wondered if concurrent cholecystectomy could be done robotically as well. He  definitely wanted cardiac clearance to see what the patient's risks were before offering options. He is already been seen by Dr. Pascal Lux with interventional radiology to consider cryoablation.  Patient claims he can walk about 20 minutes without difficulty. No exertional chest pain. He does not report exercise regularly ago. He had had no fall or trauma on that right side. No car accidents. Moderate alcohol use but has been abstinent for 2 decades. He had a left inguinal hernia repair done over a decade ago. No other abdominal surgeries. He tends to have constipation, moving his bowels maybe twice a week. No personal nor family history of GI/colon cancer, inflammatory bowel disease, irritable bowel syndrome, allergy such as Celiac Sprue, dietary/dairy problems, colitis, ulcers nor gastritis. No recent sick contacts/gastroenteritis. No travel outside the country. No changes in diet. No dysphagia to solids or liquids. No significant heartburn or reflux. No hematochezia, hematemesis, coffee ground emesis. No evidence of prior gastric/peptic ulceration. He had a colonoscopy in 2009 that showed some benign polyps. I don't think he's had a follow-up since given his advanced age.  Ready for combined chole & partial nephrectomy.    Past Surgical History (Ammie Eversole, LPN; 6/96/7893 81:01 AM) Appendectomy  Bypass Surgery for Poor Blood Flow to Legs   Diagnostic Studies History (Ammie Eversole, LPN; 7/51/0258 52:77 AM) Colonoscopy  >10 years ago  Allergies (Ammie Eversole, LPN; 07/30/2352 61:44 AM) No Known Drug Allergies 12/22/2016  Medication History (Ammie Eversole, LPN; 02/19/4007 67:61 AM) Norvasc (5MG  Tablet, Oral) Active. Aspirin (325MG  Tablet, Oral) Active. Trilipix (45MG  Capsule DR, Oral) Active. Catapres (0.1MG  Tablet, Oral) Active. Zetia (10MG  Tablet, Oral) Active. HumaLOG KwikPen (100UNIT/ML Solution, Subcutaneous) Active. Levemir (100UNIT/ML Solution,  Subcutaneous) Active. Corgard (40MG  Tablet, Oral) Active. Zofran (4MG  Tablet, Oral) Active. Roxicodone (5MG  Tablet, Oral) Active. Crestor (20MG  Tablet, Oral) Active. Synthroid (100MCG Tablet, Oral) Active.  Medications Reconciled  Social History (Ammie Eversole, LPN; 2/44/0102 72:53 AM) Alcohol use  Remotely quit alcohol use. Caffeine use  Coffee. No drug use  Tobacco use  Former smoker.  Other Problems (Ammie Eversole, LPN; 6/64/4034 74:25 AM) Diabetes Mellitus  High blood pressure  Hypercholesterolemia  Myocardial infarction     Review of Systems (Ammie Eversole LPN; 9/56/3875 64:33 AM) General Present- Appetite Loss, Chills and Night Sweats. Not Present- Fatigue, Fever, Weight Gain and Weight Loss. Skin Not Present- Change in Wart/Mole, Dryness, Hives, Jaundice, New Lesions, Non-Healing Wounds, Rash and Ulcer. HEENT Not Present- Earache, Hearing Loss, Hoarseness, Nose Bleed, Oral Ulcers, Ringing in the Ears, Seasonal Allergies, Sinus Pain, Sore Throat, Visual Disturbances, Wears glasses/contact lenses and Yellow Eyes. Respiratory Not Present- Bloody sputum, Chronic Cough, Difficulty Breathing, Snoring and Wheezing. Breast Not Present- Breast Mass, Breast Pain, Nipple Discharge and Skin Changes. Cardiovascular Not Present- Chest Pain, Difficulty Breathing Lying Down, Leg Cramps, Palpitations, Rapid Heart Rate, Shortness of Breath and Swelling of Extremities. Gastrointestinal Present- Nausea and Vomiting. Not Present- Abdominal Pain, Bloating, Bloody Stool, Change in Bowel Habits, Chronic diarrhea, Constipation, Difficulty Swallowing, Excessive gas, Gets full quickly at meals, Hemorrhoids, Indigestion and Rectal Pain. Male Genitourinary Not Present- Blood in Urine, Change in Urinary Stream, Frequency, Impotence, Nocturia, Painful Urination, Urgency and Urine Leakage. Musculoskeletal Not Present- Back Pain, Joint Pain, Joint Stiffness, Muscle Pain, Muscle Weakness and  Swelling of Extremities. Neurological Not Present- Decreased Memory, Fainting, Headaches, Numbness, Seizures, Tingling, Tremor, Trouble walking and Weakness. Psychiatric Not Present- Anxiety, Bipolar, Change in Sleep Pattern, Depression, Fearful and Frequent crying. Endocrine Not Present- Cold Intolerance, Excessive Hunger, Hair Changes, Heat Intolerance, Hot flashes and New Diabetes. Hematology Not Present- Blood Thinners, Easy Bruising, Excessive bleeding, Gland problems, HIV and Persistent Infections.  Vitals (Ammie Eversole LPN; 2/95/1884 16:60 AM) 12/22/2016 11:12 AM Weight: 193 lb Height: 69in Body Surface Area: 2.03 m Body Mass Index: 28.5 kg/m  Temp.: 98.39F(Oral)  Pulse: 58 (Regular)  BP: 138/78 (Sitting, Left Arm, Standard)   BP (!) 183/94   Pulse (!) 53   Temp 98 F (36.7 C) (Oral)   Resp 16   Ht 5\' 9"  (1.753 m)   Wt 89.8 kg (198 lb)   SpO2 97%   BMI 29.24 kg/m      Physical Exam Adin Hector MD; 12/22/2016 12:12 PM) General Mental Status-Alert. General Appearance-Not in acute distress, Not Sickly. Orientation-Oriented X3. Hydration-Well hydrated. Voice-Normal. Note: Well-developed. Slightly overweight. Moving around easily but slowly.   Integumentary Global Assessment Upon inspection and palpation of skin surfaces of the - Axillae: non-tender, no inflammation or ulceration, no drainage. and Distribution of scalp and body hair is normal. General Characteristics Temperature - normal warmth is noted.  Head and Neck Head-normocephalic, atraumatic with no lesions or palpable masses. Face Global Assessment - atraumatic, no absence of expression. Neck Global Assessment - no abnormal movements, no bruit auscultated on the right, no bruit auscultated on the left, no decreased range of motion, non-tender. Trachea-midline. Thyroid Gland Characteristics - non-tender.  Eye Eyeball - Left-Extraocular movements intact, No  Nystagmus. Eyeball - Right-Extraocular movements intact, No Nystagmus. Cornea - Left-No Hazy. Cornea - Right-No Hazy. Sclera/Conjunctiva - Left-No scleral icterus, No Discharge. Sclera/Conjunctiva - Right-No scleral icterus, No Discharge. Pupil - Left-Direct reaction to light normal. Pupil - Right-Direct reaction to light normal.  ENMT Ears Pinna - Left - no drainage observed, no generalized tenderness observed. Right - no drainage observed, no generalized tenderness observed. Nose and Sinuses External Inspection of  the Nose - no destructive lesion observed. Inspection of the nares - Left - quiet respiration. Right - quiet respiration. Mouth and Throat Lips - Upper Lip - no fissures observed, no pallor noted. Lower Lip - no fissures observed, no pallor noted. Nasopharynx - no discharge present. Oral Cavity/Oropharynx - Tongue - no dryness observed. Oral Mucosa - no cyanosis observed. Hypopharynx - no evidence of airway distress observed.  Chest and Lung Exam Inspection Movements - Normal and Symmetrical. Accessory muscles - No use of accessory muscles in breathing. Palpation Palpation of the chest reveals - Non-tender. Auscultation Breath sounds - Normal and Clear. Note: Median incision over her breast bone consistent with prior cardiac surgery   Cardiovascular Auscultation Rhythm - Regular. Murmurs & Other Heart Sounds - Auscultation of the heart reveals - No Murmurs and No Systolic Clicks.  Abdomen Inspection Inspection of the abdomen reveals - No Visible peristalsis and No Abnormal pulsations. Umbilicus - No Bleeding, No Urine drainage. Palpation/Percussion Palpation and Percussion of the abdomen reveal - Soft, Non Tender, No Rebound tenderness, No Rigidity (guarding) and No Cutaneous hyperesthesia. Note: Abdomen soft. Moderate diastasis recti. Some chronic hyperpigmented upper abdomen consistent with his insulin shots. Nontender, nondistended. No guarding. No  diastasis. No umbilical nor other hernias   Male Genitourinary Sexual Maturity Tanner 5 - Adult hair pattern and Adult penile size and shape. Note: Prior left groin incision consistent with history of open inguinal hernia repair. No inguinal hernias. Normal external genitalia. Epididymi, testes, and spermatic cords normal without any masses.   Peripheral Vascular Upper Extremity Inspection - Left - No Cyanotic nailbeds, Not Ischemic. Right - No Cyanotic nailbeds, Not Ischemic.  Neurologic Neurologic evaluation reveals -normal attention span and ability to concentrate, able to name objects and repeat phrases. Appropriate fund of knowledge , normal sensation and normal coordination. Mental Status Affect - not angry, not paranoid. Cranial Nerves-Normal Bilaterally. Gait-Normal.  Neuropsychiatric Mental status exam performed with findings of-able to articulate well with normal speech/language, rate, volume and coherence, thought content normal with ability to perform basic computations and apply abstract reasoning and no evidence of hallucinations, delusions, obsessions or homicidal/suicidal ideation.  Musculoskeletal Global Assessment Spine, Ribs and Pelvis - no instability, subluxation or laxity. Right Upper Extremity - no instability, subluxation or laxity.  Lymphatic Head & Neck  General Head & Neck Lymphatics: Bilateral - Description - No Localized lymphadenopathy. Axillary  General Axillary Region: Bilateral - Description - No Localized lymphadenopathy. Femoral & Inguinal  Generalized Femoral & Inguinal Lymphatics: Left - Description - No Localized lymphadenopathy. Right - Description - No Localized lymphadenopathy.    Assessment & Plan   CHRONIC CHOLECYSTITIS WITH CALCULUS (K80.10)  Impression: Story of biliary colic rather classic. Risks of the differential diagnosis seems unlikely.  I think he would benefit from cholecystectomy at some point to break the  cycle of attacks. Because his urologist would plan a partial nephrectomy robotically, I can proceed with cholecystectomy robotically as well.  Fortunately the renal cell mass his ipsilateral, softly not to complicated for repositioning or extra ports. The graft the patient does have a prior coronary bypass graft with some cardiac history. He is of advanced age. However, he actually seems to have pretty good activity level and exercise tolerance. 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 Leaning more towards being aggressive in dealing with this in conjunction with the partial nephrectomy of his right kidney.  I definitely would like cardiac clearance. Dr. Debara Pickett evaluated: "Notes Recorded by Pixie Casino,  MD on 01/05/2017 at 4:18 PM EST: Low risk stress test without ischemia. LVEF 44% - would confirm EF with a limited echo. Should be at acceptable risk for surgery based on these findings."    Current Plans I recommended obtaining preoperative cardiac clearance. I am concerned about the health of the patient and the ability to tolerate the operation. Therefore, we will request clearance by cardiology to better assess operative risk & see if a reevaluation, further workup, etc is needed. Also recommendations on how medications such as for anticoagulation and blood pressure should be managed/held/restarted after surgery. You are being scheduled for surgery- Our schedulers will call you.  You should hear from our office's scheduling department within 5 working days about the location, date, and time of surgery. We try to make accommodations for patient's preferences in scheduling surgery, but sometimes the OR schedule or the surgeon's schedule prevents [place] from making those accommodations.  If you have not heard from our office [phone]) in 5 working days, call the office and ask for your surgeon's nurse.  If you have other questions about your diagnosis, plan, or surgery, call the office and ask  for your surgeon's nurse.  The anatomy & physiology of hepatobiliary & pancreatic function was discussed. The pathophysiology of gallbladder dysfunction was discussed. Natural history risks without surgery was discussed. I feel the risks of no intervention will lead to serious problems that outweigh the operative risks; therefore, I recommended cholecystectomy to remove the pathology. I explained laparoscopic techniques with possible need for an open approach. Probable cholangiogram to evaluate the bilary tract was explained as well.  Risks such as bleeding, infection, abscess, leak, injury to other organs, need for further treatment, heart attack, death, and other risks were discussed. I noted a good likelihood this will help address the problem. Possibility that this will not correct all abdominal symptoms was explained. Goals of post-operative recovery were discussed as well. We will work to minimize complications. An educational handout further explaining the pathology and treatment options was given as well. Questions were answered.   The patient & family express understanding & wish to proceed with surgery.    Pt Education - Pamphlet Given - Laparoscopic Gallbladder Surgery: discussed with patient and provided information. Written instructions provided Pt Education - [facility] Laparosopic Post Op HCI (Jaeleigh Monaco) Pt Education - [place] (Ginnette Gates) Pt Education - Laparoscopic Cholecystectomy: gallbladder RIGHT RENAL MASS (N28.89) Impression: Suspicious right renal mass. Most likely renal cell carcinoma. Dr. Louis Meckel planning to treat in some fashion. If reasonable operative risk, he was planning partial nephrectomy robotically. Severely high risk, will do cryoablation only. Deferred to him.  Adin Hector, M.D., F.A.C.S. Gastrointestinal and Minimally Invasive Surgery [facility] [address], [place], [place] [id number] [phone] Main /  Paging

## 2017-02-19 NOTE — Assessment & Plan Note (Signed)
Features c/w acute post op acute pulmonary edema' Neednig face mask o2 with still post anesthesia drowsiness  Plan STat BipaP continupuis sTart aggressve lasix Rule out MI Check ECHO Check PM stat labs cbc, bmet, mag, phos, bnp. Troponin Check ABG  - itnubate if needed or worse

## 2017-02-19 NOTE — Assessment & Plan Note (Signed)
Plan MAP goal > 65 Renal dose meds Strict I&O f/u am chemistry

## 2017-02-20 ENCOUNTER — Inpatient Hospital Stay (HOSPITAL_COMMUNITY): Payer: Medicare Other

## 2017-02-20 DIAGNOSIS — G9340 Encephalopathy, unspecified: Secondary | ICD-10-CM

## 2017-02-20 DIAGNOSIS — R0902 Hypoxemia: Secondary | ICD-10-CM

## 2017-02-20 DIAGNOSIS — J9600 Acute respiratory failure, unspecified whether with hypoxia or hypercapnia: Secondary | ICD-10-CM

## 2017-02-20 DIAGNOSIS — N189 Chronic kidney disease, unspecified: Secondary | ICD-10-CM

## 2017-02-20 DIAGNOSIS — N179 Acute kidney failure, unspecified: Secondary | ICD-10-CM

## 2017-02-20 LAB — CBC WITH DIFFERENTIAL/PLATELET
BASOS PCT: 0 %
Basophils Absolute: 0 10*3/uL (ref 0.0–0.1)
EOS ABS: 0 10*3/uL (ref 0.0–0.7)
Eosinophils Relative: 0 %
HCT: 39.3 % (ref 39.0–52.0)
HEMOGLOBIN: 13.1 g/dL (ref 13.0–17.0)
LYMPHS ABS: 1 10*3/uL (ref 0.7–4.0)
Lymphocytes Relative: 5 %
MCH: 27.3 pg (ref 26.0–34.0)
MCHC: 33.3 g/dL (ref 30.0–36.0)
MCV: 82 fL (ref 78.0–100.0)
MONO ABS: 1.2 10*3/uL — AB (ref 0.1–1.0)
MONOS PCT: 5 %
Neutro Abs: 19.1 10*3/uL — ABNORMAL HIGH (ref 1.7–7.7)
Neutrophils Relative %: 90 %
Platelets: 232 10*3/uL (ref 150–400)
RBC: 4.79 MIL/uL (ref 4.22–5.81)
RDW: 15.7 % — AB (ref 11.5–15.5)
WBC: 21.3 10*3/uL — ABNORMAL HIGH (ref 4.0–10.5)

## 2017-02-20 LAB — GLUCOSE, CAPILLARY
GLUCOSE-CAPILLARY: 322 mg/dL — AB (ref 65–99)
Glucose-Capillary: 169 mg/dL — ABNORMAL HIGH (ref 65–99)
Glucose-Capillary: 367 mg/dL — ABNORMAL HIGH (ref 65–99)

## 2017-02-20 LAB — TROPONIN I
Troponin I: 0.03 ng/mL (ref <0.03)
Troponin I: 0.03 ng/mL (ref <0.03)

## 2017-02-20 LAB — BASIC METABOLIC PANEL
Anion gap: 16 — ABNORMAL HIGH (ref 5–15)
BUN: 33 mg/dL — ABNORMAL HIGH (ref 6–20)
CALCIUM: 7.5 mg/dL — AB (ref 8.9–10.3)
CO2: 17 mmol/L — ABNORMAL LOW (ref 22–32)
CREATININE: 2.54 mg/dL — AB (ref 0.61–1.24)
Chloride: 108 mmol/L (ref 101–111)
GFR calc Af Amer: 26 mL/min — ABNORMAL LOW (ref >60)
GFR, EST NON AFRICAN AMERICAN: 22 mL/min — AB (ref >60)
GLUCOSE: 144 mg/dL — AB (ref 65–99)
Potassium: 4.5 mmol/L (ref 3.5–5.1)
Sodium: 141 mmol/L (ref 135–145)

## 2017-02-20 LAB — ECHOCARDIOGRAM COMPLETE
HEIGHTINCHES: 69 in
WEIGHTICAEL: 3168 [oz_av]

## 2017-02-20 LAB — PHOSPHORUS: Phosphorus: 6.5 mg/dL — ABNORMAL HIGH (ref 2.5–4.6)

## 2017-02-20 LAB — MAGNESIUM: MAGNESIUM: 2 mg/dL (ref 1.7–2.4)

## 2017-02-20 NOTE — Progress Notes (Signed)
1 Day Post-Op Subjective: Patient reports no pain. His pulmonary status has improved and he is on 2L [place]. Hemoglobin stable  Objective: Vital signs in last 24 hours: Temp:  [96.3 F (35.7 C)-98 F (36.7 C)] 98 F (36.7 C) (03/17 0309) Pulse Rate:  [52-66] 56 (03/17 1100) Resp:  [8-23] 21 (03/17 1100) BP: (119-219)/(55-131) 147/55 (03/17 1100) SpO2:  [85 %-100 %] 100 % (03/17 1100) Arterial Line BP: (156-216)/(65-93) 191/81 (03/16 1843) FiO2 (%):  [40 %] 40 % (03/17 0000)  Intake/Output from previous day: 03/16 0701 - 03/17 0700 In: 2695.3 [P.O.:200; I.V.:2195.3; IV Piggyback:300] Out: 1945 [Urine:1065; Drains:480; Blood:400] Intake/Output this shift: Total I/O In: 600 [P.O.:360; I.V.:20; Other:120; IV Piggyback:100] Out: 74 [Urine:50]  Physical Exam:  General:alert, cooperative and appears stated age GI: soft, non tender, normal bowel sounds, no palpable masses, no organomegaly, no inguinal hernia Male genitalia: not done Extremities: extremities normal, atraumatic, no cyanosis or edema  Lab Results:  Recent Labs  02/19/17 1740 02/19/17 1926 02/20/17 0518  HGB 14.0 13.7 13.1  HCT 42.4 40.7 39.3   BMET  Recent Labs  02/19/17 1926 02/20/17 0518  NA 139 141  K 3.7 4.5  CL 108 108  CO2 21* 17*  GLUCOSE 115* 144*  BUN 25* 33*  CREATININE 1.96* 2.54*  CALCIUM 8.3* 7.5*   No results for input(s): LABPT, INR in the last 72 hours. No results for input(s): LABURIN in the last 72 hours. Results for orders placed or performed during the hospital encounter of 05/12/13  Urine culture     Status: None   Collection Time: 05/12/13  3:43 PM  Result Value Ref Range Status   Specimen Description URINE, CLEAN CATCH  Final   Special Requests NONE  Final   Culture  Setup Time 05/12/2013 16:15  Final   Colony Count 3,000 COLONIES/ML  Final   Culture INSIGNIFICANT GROWTH  Final   Report Status 05/13/2013 FINAL  Final  MRSA PCR Screening     Status: None   Collection Time:  05/12/13  7:47 PM  Result Value Ref Range Status   MRSA by PCR NEGATIVE NEGATIVE Final    Comment:        The GeneXpert MRSA Assay (FDA approved for NASAL specimens only), is one component of a comprehensive MRSA colonization surveillance program. It is not intended to diagnose MRSA infection nor to guide or monitor treatment for MRSA infections.    Studies/Results: Dg Chest Port 1 View  Result Date: 02/20/2017 CLINICAL DATA:  Acute pulmonary edema. EXAM: PORTABLE CHEST 1 VIEW COMPARISON:  02/19/2017 FINDINGS: Sternotomy wires unchanged. Lungs are hypoinflated demonstrate slight interval improvement in hazy bilateral perihilar bibasilar opacification likely proven mild interstitial edema. Stable left effusion with associated basilar atelectasis. Cannot exclude infection in the lung bases. Stable cardiomegaly. Remainder of the exam is unchanged. IMPRESSION: Slight interval improvement in mild vascular congestion. Persistent left base opacification likely effusion with atelectasis. Cannot exclude infection in the lung bases. Stable cardiomegaly. Electronically Signed   By: Marin Olp M.D.   On: 02/20/2017 07:56   Dg Chest Port 1 View  Result Date: 02/19/2017 CLINICAL DATA:  Shortness of breath postop EXAM: PORTABLE CHEST 1 VIEW COMPARISON:  11/18/2016 FINDINGS: Post sternotomy changes. Cardiomegaly with central vascular congestion and diffuse interstitial and alveolar opacity suggestive of pulmonary edema. Probable small bilateral effusions. Unable to exclude atelectasis or pneumonia at the left lung base. No pneumothorax. IMPRESSION: 1. Cardiomegaly with central vascular congestion and moderate interstitial and alveolar opacity consistent  with pulmonary edema 2. Probable small bilateral effusions. Unable to rule out atelectasis or pneumonia at the left lung base. Electronically Signed   By: Donavan Foil M.D.   On: 02/19/2017 19:16    Assessment/Plan: 83yo POD #1 partial nephrectomy  1.  Transfer to floor 2. Advance diet 3. Ambulate in halls   LOS: 1 day   Nicolette Bang 02/20/2017, 12:05 PM

## 2017-02-20 NOTE — Progress Notes (Signed)
PULMONARY / CRITICAL CARE MEDICINE   Name: PATRYCK KILGORE MRN: [id number] DOB: 1932/07/29    ADMISSION DATE:  02/19/2017 CONSULTATION DATE:  3/16   REFERRING MD:  Johney Maine  CHIEF COMPLAINT: hypercarbic respiratory failure   HISTORY OF PRESENT ILLNESS:   This is a 83 year old aam w/ sig h/o right renal mass & chronic calculus cholecystitis and also CAD (w/ prior CABG).  Now s/p elective robotic assisted partial right Nephrectomy and robotic assisted laparoscopic cholecystectomy on 3/16. He was extubated after 4h of anesthesia time at approx 16.40 on 02/19/2017 . Then found to be hyxpoxemic and stil drowsly CXR suggested acute pulm edema and given lasix ad labetealol with reported imrpovement. PCCM asked to assist w/ care for acute post op hypoxemic resp failure  2011 echo with ef 60% and diast dsfn + Jan 2018 [place] med stress test - normal but ef 44%  Subjective: no events overnight, required BiPAP  VITAL SIGNS: BP (!) 137/57   Pulse (!) 55   Temp 98 F (36.7 C) (Axillary)   Resp 20   Ht 5\' 9"  (1.753 m)   Wt 89.8 kg (198 lb)   SpO2 100%   BMI 29.24 kg/m   HEMODYNAMICS:    VENTILATOR SETTINGS: FiO2 (%):  [40 %] 40 %  INTAKE / OUTPUT: I/O last 3 completed shifts: In: 2695.3 [P.O.:200; I.V.:2195.3; IV Piggyback:300] Out: 1945 [Urine:1065; Drains:480; Blood:400]  PHYSICAL EXAMINATION:  EXAM  General Appearance:    Off BiPAP, on 2L [place]  Head:    [place]/AT  Eyes:    PERRL, EOM-I   Ears:    Normal external ear canals, both ears  Nose:   NG tube - no  Throat:  ETT TUBE - no , OG tube - no  Neck:   Supple,  No enlargement/tenderness/nodules     Lungs:     Clear to auscultation bilaterally but some distant crackles +  Chest wall:    No deformity but has CABG scare +  Heart:    S1 and S2 normal, no murmur, CVP - na.  Pressors - no  Abdomen:     Soft, no masses, no organomegaly bu has RLQ drain + and incision of lap chole  Genitalia:    Not done  Rectal:   not done  Extremities:    Extremities- no edema     Skin:   Intact in exposed areas . Sacral area - reportedly intact     Neurologic:   Sedation - post anesthesia extubation x 2h -> RASS - -2  . Moves all 4s - yes. CAM-ICU - not tested . Orientation - seems aware of surruodnings       LABS  PULMONARY  Recent Labs Lab 02/19/17 1848 02/19/17 2054  PHART 7.300* 7.329*  PCO2ART 45.8 40.3  PO2ART 72.7* 72.0*  HCO3 21.9 20.7  O2SAT 91.5 93.0    CBC  Recent Labs Lab 02/15/17 1119 02/19/17 1740 02/19/17 1926 02/20/17 0518  HGB 13.5 14.0 13.7 13.1  HCT 40.2 42.4 40.7 39.3  WBC 8.6  --  15.6* 21.3*  PLT 205  --  212 232    COAGULATION No results for input(s): INR in the last 168 hours.  CARDIAC    Recent Labs Lab 02/19/17 1926 02/20/17 0142 02/20/17 0726  TROPONINI <0.03 0.03* 0.03*   No results for input(s): PROBNP in the last 168 hours.   CHEMISTRY  Recent Labs Lab 02/15/17 1119 02/19/17 1926 02/20/17 0518  NA 140 139 141  K 4.0 3.7 4.5  CL 110 108 108  CO2 24 21* 17*  GLUCOSE 189* 115* 144*  BUN 17 25* 33*  CREATININE 1.47* 1.96* 2.54*  CALCIUM 9.0 8.3* 7.5*  MG  --  2.2 2.0  PHOS  --  4.8* 6.5*   Estimated Creatinine Clearance: 24.8 mL/min (A) (by C-G formula based on SCr of 2.54 mg/dL (H)).   LIVER No results for input(s): AST, ALT, ALKPHOS, BILITOT, PROT, ALBUMIN, INR in the last 168 hours.   INFECTIOUS  Recent Labs Lab 02/19/17 1945  LATICACIDVEN 1.1     ENDOCRINE CBG (last 3)   Recent Labs  02/19/17 1715 02/19/17 2208 02/20/17 0747  GLUCAP 84 118* 169*         IMAGING x48h  - image(s) personally visualized  -   highlighted in bold Dg Chest Port 1 View  Result Date: 02/20/2017 CLINICAL DATA:  Acute pulmonary edema. EXAM: PORTABLE CHEST 1 VIEW COMPARISON:  02/19/2017 FINDINGS: Sternotomy wires unchanged. Lungs are hypoinflated demonstrate slight interval improvement in hazy bilateral perihilar bibasilar opacification likely proven mild  interstitial edema. Stable left effusion with associated basilar atelectasis. Cannot exclude infection in the lung bases. Stable cardiomegaly. Remainder of the exam is unchanged. IMPRESSION: Slight interval improvement in mild vascular congestion. Persistent left base opacification likely effusion with atelectasis. Cannot exclude infection in the lung bases. Stable cardiomegaly. Electronically Signed   By: Marin Olp M.D.   On: 02/20/2017 07:56   Dg Chest Port 1 View  Result Date: 02/19/2017 CLINICAL DATA:  Shortness of breath postop EXAM: PORTABLE CHEST 1 VIEW COMPARISON:  11/18/2016 FINDINGS: Post sternotomy changes. Cardiomegaly with central vascular congestion and diffuse interstitial and alveolar opacity suggestive of pulmonary edema. Probable small bilateral effusions. Unable to exclude atelectasis or pneumonia at the left lung base. No pneumothorax. IMPRESSION: 1. Cardiomegaly with central vascular congestion and moderate interstitial and alveolar opacity consistent with pulmonary edema 2. Probable small bilateral effusions. Unable to rule out atelectasis or pneumonia at the left lung base. Electronically Signed   By: Donavan Foil M.D.   On: 02/19/2017 19:16   I reviewed CXR myself, pulmonary edema noted.  ASSESSMENT and PLAN  83 year old male s/p robotic gal bladder surgery who developed post op hypercarbic respiratory failure requiring BiPAP.  PCCM consulted for BiPAP management.  Acute respiratory failure likely sedation/anesthesia related  - D/C BiPAP  - Titrate o2 for sat of 88-92%  Cholecystitis:  - NPO  - Otherwise per surgery  Acute on chronic renal failure:  - KVO IVF  - Lasix 40 mg IV q8 x2 doses.  - Renal dose all drugs  Diabetes:  - CBG  - ISS  Discussed with PCCM-NP.  PCCM will sign off, please call back if needed.  Rush Farmer, M.D. [facility] Pulmonary/Critical Care Medicine. Pager: [phone]. After hours pager: [phone].

## 2017-02-20 NOTE — Progress Notes (Signed)
Echocardiogram 2D Echocardiogram has been performed.  Joelene Millin 02/20/2017, 4:39 PM

## 2017-02-20 NOTE — Progress Notes (Signed)
CRITICAL VALUE ALERT  Critical value received:  Troponin 0.03  Date of notification:  02/20/2017  Time of notification:  5075  Critical value read back:Yes.    Nurse who received alert:  Odis Hollingshead RN   MD notified (1st page):  Warren Lacy MD   Time of first page:  0320  Responding MD:  Warren Lacy MD  Time MD responded:  [phone]

## 2017-02-20 NOTE — Progress Notes (Signed)
1 Day Post-Op  Subjective: Looks good comfortable  Objective: Vital signs in last 24 hours: Temp:  [96.3 F (35.7 C)-98 F (36.7 C)] 98 F (36.7 C) (03/17 0309) Pulse Rate:  [52-66] 55 (03/17 0800) Resp:  [8-23] 20 (03/17 0800) BP: (119-219)/(56-131) 137/57 (03/17 0800) SpO2:  [85 %-100 %] 100 % (03/17 0800) Arterial Line BP: (156-216)/(65-93) 191/81 (03/16 1843) FiO2 (%):  [40 %] 40 % (03/17 0000) Last BM Date:  (PTA)  Intake/Output from previous day: 03/16 0701 - 03/17 0700 In: 2695.3 [P.O.:200; I.V.:2195.3; IV Piggyback:300] Out: 1945 [Urine:1065; Drains:480; Blood:400] Intake/Output this shift: Total I/O In: 20 [I.V.:20] Out: 50 [Urine:50]  Exam: Awake and alert Abdomen soft, non distended  Lab Results:   Recent Labs  02/19/17 1926 02/20/17 0518  WBC 15.6* 21.3*  HGB 13.7 13.1  HCT 40.7 39.3  PLT 212 232   BMET  Recent Labs  02/19/17 1926 02/20/17 0518  NA 139 141  K 3.7 4.5  CL 108 108  CO2 21* 17*  GLUCOSE 115* 144*  BUN 25* 33*  CREATININE 1.96* 2.54*  CALCIUM 8.3* 7.5*   PT/INR No results for input(s): LABPROT, INR in the last 72 hours. ABG  Recent Labs  02/19/17 1848 02/19/17 2054  PHART 7.300* 7.329*  HCO3 21.9 20.7    Studies/Results: Dg Chest Port 1 View  Result Date: 02/20/2017 CLINICAL DATA:  Acute pulmonary edema. EXAM: PORTABLE CHEST 1 VIEW COMPARISON:  02/19/2017 FINDINGS: Sternotomy wires unchanged. Lungs are hypoinflated demonstrate slight interval improvement in hazy bilateral perihilar bibasilar opacification likely proven mild interstitial edema. Stable left effusion with associated basilar atelectasis. Cannot exclude infection in the lung bases. Stable cardiomegaly. Remainder of the exam is unchanged. IMPRESSION: Slight interval improvement in mild vascular congestion. Persistent left base opacification likely effusion with atelectasis. Cannot exclude infection in the lung bases. Stable cardiomegaly. Electronically Signed    By: Marin Olp M.D.   On: 02/20/2017 07:56   Dg Chest Port 1 View  Result Date: 02/19/2017 CLINICAL DATA:  Shortness of breath postop EXAM: PORTABLE CHEST 1 VIEW COMPARISON:  11/18/2016 FINDINGS: Post sternotomy changes. Cardiomegaly with central vascular congestion and diffuse interstitial and alveolar opacity suggestive of pulmonary edema. Probable small bilateral effusions. Unable to exclude atelectasis or pneumonia at the left lung base. No pneumothorax. IMPRESSION: 1. Cardiomegaly with central vascular congestion and moderate interstitial and alveolar opacity consistent with pulmonary edema 2. Probable small bilateral effusions. Unable to rule out atelectasis or pneumonia at the left lung base. Electronically Signed   By: Donavan Foil M.D.   On: 02/19/2017 19:16    Anti-infectives: Anti-infectives    Start     Dose/Rate Route Frequency Ordered Stop   02/19/17 0832  ceFAZolin (ANCEF) IVPB 2g/100 mL premix     2 g 200 mL/hr over 30 Minutes Intravenous 30 min pre-op 02/19/17 0623 02/19/17 1245      Assessment/Plan: s/p Procedure(s): XI ROBOTIC ASSITED PARTIAL NEPHRECTOMY (Right) XI ROBOTIC ASSISTED LAPAROSCOPIC CHOLECYSTECTOMY (N/A)  Doing well Plans per Urology  LOS: 1 day    [name] A 02/20/2017

## 2017-02-21 ENCOUNTER — Inpatient Hospital Stay (HOSPITAL_COMMUNITY): Payer: Medicare Other

## 2017-02-21 LAB — CBC WITH DIFFERENTIAL/PLATELET
BASOS ABS: 0 10*3/uL (ref 0.0–0.1)
Basophils Relative: 0 %
Eosinophils Absolute: 0 10*3/uL (ref 0.0–0.7)
Eosinophils Relative: 0 %
HCT: 32.3 % — ABNORMAL LOW (ref 39.0–52.0)
Hemoglobin: 11 g/dL — ABNORMAL LOW (ref 13.0–17.0)
LYMPHS PCT: 8 %
Lymphs Abs: 1.3 10*3/uL (ref 0.7–4.0)
MCH: 27.5 pg (ref 26.0–34.0)
MCHC: 34.1 g/dL (ref 30.0–36.0)
MCV: 80.8 fL (ref 78.0–100.0)
MONO ABS: 1.2 10*3/uL — AB (ref 0.1–1.0)
MONOS PCT: 7 %
Neutro Abs: 13.8 10*3/uL — ABNORMAL HIGH (ref 1.7–7.7)
Neutrophils Relative %: 85 %
Platelets: 173 10*3/uL (ref 150–400)
RBC: 4 MIL/uL — ABNORMAL LOW (ref 4.22–5.81)
RDW: 15.9 % — AB (ref 11.5–15.5)
WBC: 16.4 10*3/uL — ABNORMAL HIGH (ref 4.0–10.5)

## 2017-02-21 LAB — GLUCOSE, CAPILLARY
GLUCOSE-CAPILLARY: 216 mg/dL — AB (ref 65–99)
GLUCOSE-CAPILLARY: 189 mg/dL — AB (ref 65–99)
Glucose-Capillary: 146 mg/dL — ABNORMAL HIGH (ref 65–99)
Glucose-Capillary: 193 mg/dL — ABNORMAL HIGH (ref 65–99)
Glucose-Capillary: 214 mg/dL — ABNORMAL HIGH (ref 65–99)

## 2017-02-21 LAB — BASIC METABOLIC PANEL
ANION GAP: 10 (ref 5–15)
BUN: 53 mg/dL — ABNORMAL HIGH (ref 6–20)
CALCIUM: 7.7 mg/dL — AB (ref 8.9–10.3)
CO2: 24 mmol/L (ref 22–32)
Chloride: 102 mmol/L (ref 101–111)
Creatinine, Ser: 3.79 mg/dL — ABNORMAL HIGH (ref 0.61–1.24)
GFR calc Af Amer: 16 mL/min — ABNORMAL LOW (ref >60)
GFR calc non Af Amer: 14 mL/min — ABNORMAL LOW (ref >60)
GLUCOSE: 195 mg/dL — AB (ref 65–99)
Potassium: 4.2 mmol/L (ref 3.5–5.1)
Sodium: 136 mmol/L (ref 135–145)

## 2017-02-21 LAB — MAGNESIUM: Magnesium: 2.4 mg/dL (ref 1.7–2.4)

## 2017-02-21 LAB — PHOSPHORUS: Phosphorus: 4.6 mg/dL (ref 2.5–4.6)

## 2017-02-21 MED ORDER — SODIUM CHLORIDE 0.9 % IV SOLN
10.0000 mL/kg | Freq: Once | INTRAVENOUS | Status: AC
  Administered 2017-02-21: 898 mL via INTRAVENOUS

## 2017-02-21 MED ORDER — FUROSEMIDE 10 MG/ML IJ SOLN: 40.0000 mg | Freq: Once | INTRAMUSCULAR | Status: DC

## 2017-02-21 NOTE — Progress Notes (Signed)
2 Days Post-Op  Subjective: Complains of mild incisional pain Tolerating a diet  Objective: Vital signs in last 24 hours: Temp:  [98.4 F (36.9 C)-98.9 F (37.2 C)] 98.9 F (37.2 C) (03/18 0444) Pulse Rate:  [51-56] 52 (03/18 0444) Resp:  [18-23] 19 (03/18 0444) BP: (116-153)/(45-76) 133/55 (03/18 0444) SpO2:  [83 %-100 %] 90 % (03/18 0444) Last BM Date:  (PTA)  Intake/Output from previous day: 03/17 0701 - 03/18 0700 In: 1700 [P.O.:1160; I.V.:440; IV Piggyback:100] Out: 525 [Urine:370; Drains:155] Intake/Output this shift: No intake/output data recorded.  Exam: Abdomen soft, obese Incisions clean Drain serosang  Lab Results:   Recent Labs  02/20/17 0518 02/21/17 0342  WBC 21.3* 16.4*  HGB 13.1 11.0*  HCT 39.3 32.3*  PLT 232 173   BMET  Recent Labs  02/20/17 0518 02/21/17 0342  NA 141 136  K 4.5 4.2  CL 108 102  CO2 17* 24  GLUCOSE 144* 195*  BUN 33* 53*  CREATININE 2.54* 3.79*  CALCIUM 7.5* 7.7*   PT/INR No results for input(s): LABPROT, INR in the last 72 hours. ABG  Recent Labs  02/19/17 1848 02/19/17 2054  PHART 7.300* 7.329*  HCO3 21.9 20.7    Studies/Results: Dg Chest Port 1 View  Result Date: 02/20/2017 CLINICAL DATA:  Acute pulmonary edema. EXAM: PORTABLE CHEST 1 VIEW COMPARISON:  02/19/2017 FINDINGS: Sternotomy wires unchanged. Lungs are hypoinflated demonstrate slight interval improvement in hazy bilateral perihilar bibasilar opacification likely proven mild interstitial edema. Stable left effusion with associated basilar atelectasis. Cannot exclude infection in the lung bases. Stable cardiomegaly. Remainder of the exam is unchanged. IMPRESSION: Slight interval improvement in mild vascular congestion. Persistent left base opacification likely effusion with atelectasis. Cannot exclude infection in the lung bases. Stable cardiomegaly. Electronically Signed   By: Marin Olp M.D.   On: 02/20/2017 07:56   Dg Chest Port 1 View  Result  Date: 02/19/2017 CLINICAL DATA:  Shortness of breath postop EXAM: PORTABLE CHEST 1 VIEW COMPARISON:  11/18/2016 FINDINGS: Post sternotomy changes. Cardiomegaly with central vascular congestion and diffuse interstitial and alveolar opacity suggestive of pulmonary edema. Probable small bilateral effusions. Unable to exclude atelectasis or pneumonia at the left lung base. No pneumothorax. IMPRESSION: 1. Cardiomegaly with central vascular congestion and moderate interstitial and alveolar opacity consistent with pulmonary edema 2. Probable small bilateral effusions. Unable to rule out atelectasis or pneumonia at the left lung base. Electronically Signed   By: Donavan Foil M.D.   On: 02/19/2017 19:16    Anti-infectives: Anti-infectives    Start     Dose/Rate Route Frequency Ordered Stop   02/19/17 0832  ceFAZolin (ANCEF) IVPB 2g/100 mL premix     2 g 200 mL/hr over 30 Minutes Intravenous 30 min pre-op 02/19/17 4888 02/19/17 1245      Assessment/Plan: s/p Procedure(s): XI ROBOTIC ASSITED PARTIAL NEPHRECTOMY (Right) XI ROBOTIC ASSISTED LAPAROSCOPIC CHOLECYSTECTOMY (N/A)   Stable from gen [place] to advance diet Will see PRN  LOS: 2 days    Shatoya Roets A 02/21/2017

## 2017-02-21 NOTE — Progress Notes (Signed)
2 Days Post-Op Subjective: Patient reports no pain. His pulmonary status has improved and he is on 2L [place]. Creatinine worsening and urine output decreasing  Objective: Vital signs in last 24 hours: Temp:  [98.4 F (36.9 C)-98.9 F (37.2 C)] 98.4 F (36.9 C) (03/18 1447) Pulse Rate:  [51-57] 57 (03/18 1447) Resp:  [19-20] 20 (03/18 1447) BP: (116-145)/(55-61) 145/56 (03/18 1447) SpO2:  [74 %-100 %] 92 % (03/18 1447)  Intake/Output from previous day: 03/17 0701 - 03/18 0700 In: 1700 [P.O.:1160; I.V.:440; IV Piggyback:100] Out: 525 [Urine:370; Drains:155] Intake/Output this shift: Total I/O In: 360 [P.O.:360] Out: 750 [Urine:750]  Physical Exam:  General:alert, cooperative and appears stated age GI: soft, non tender, normal bowel sounds, no palpable masses, no organomegaly, no inguinal hernia Male genitalia: not done Extremities: extremities normal, atraumatic, no cyanosis or edema  Lab Results:  Recent Labs  02/19/17 1926 02/20/17 0518 02/21/17 0342  HGB 13.7 13.1 11.0*  HCT 40.7 39.3 32.3*   BMET  Recent Labs  02/20/17 0518 02/21/17 0342  NA 141 136  K 4.5 4.2  CL 108 102  CO2 17* 24  GLUCOSE 144* 195*  BUN 33* 53*  CREATININE 2.54* 3.79*  CALCIUM 7.5* 7.7*   No results for input(s): LABPT, INR in the last 72 hours. No results for input(s): LABURIN in the last 72 hours. Results for orders placed or performed during the hospital encounter of 05/12/13  Urine culture     Status: None   Collection Time: 05/12/13  3:43 PM  Result Value Ref Range Status   Specimen Description URINE, CLEAN CATCH  Final   Special Requests NONE  Final   Culture  Setup Time 05/12/2013 16:15  Final   Colony Count 3,000 COLONIES/ML  Final   Culture INSIGNIFICANT GROWTH  Final   Report Status 05/13/2013 FINAL  Final  MRSA PCR Screening     Status: None   Collection Time: 05/12/13  7:47 PM  Result Value Ref Range Status   MRSA by PCR NEGATIVE NEGATIVE Final    Comment:         The GeneXpert MRSA Assay (FDA approved for NASAL specimens only), is one component of a comprehensive MRSA colonization surveillance program. It is not intended to diagnose MRSA infection nor to guide or monitor treatment for MRSA infections.    Studies/Results: Dg Chest Port 1 View  Result Date: 02/21/2017 CLINICAL DATA:  Shortness of breath today per EXAM: PORTABLE CHEST 1 VIEW COMPARISON:  02/20/2017 FINDINGS: Sternotomy wires unchanged. Lungs are hypoinflated with hazy bilateral perihilar and bibasilar opacification without significant change. Findings likely due to mild interstitial edema with small left effusion/atelectasis although cannot exclude infection in the lung bases. Stable cardiomegaly. Remainder of the exam is unchanged. IMPRESSION: Stable bilateral perihilar and bibasilar opacification suggesting mild interstitial edema with left effusions/atelectasis. Cannot exclude infection in the lung bases. Stable cardiomegaly. Electronically Signed   By: Marin Olp M.D.   On: 02/21/2017 10:22   Dg Chest Port 1 View  Result Date: 02/20/2017 CLINICAL DATA:  Acute pulmonary edema. EXAM: PORTABLE CHEST 1 VIEW COMPARISON:  02/19/2017 FINDINGS: Sternotomy wires unchanged. Lungs are hypoinflated demonstrate slight interval improvement in hazy bilateral perihilar bibasilar opacification likely proven mild interstitial edema. Stable left effusion with associated basilar atelectasis. Cannot exclude infection in the lung bases. Stable cardiomegaly. Remainder of the exam is unchanged. IMPRESSION: Slight interval improvement in mild vascular congestion. Persistent left base opacification likely effusion with atelectasis. Cannot exclude infection in the lung bases. Stable  cardiomegaly. Electronically Signed   By: Marin Olp M.D.   On: 02/20/2017 07:56    Assessment/Plan: 83yo POD #2 partial nephrectomy  1. 1L fluid bolus, d/c lasix due to worsening renal function 2.. Ambulate in halls    LOS: 2 days   Nicolette Bang 02/21/2017, 6:50 PM

## 2017-02-22 ENCOUNTER — Inpatient Hospital Stay (HOSPITAL_COMMUNITY): Payer: Medicare Other

## 2017-02-22 LAB — CBC WITH DIFFERENTIAL/PLATELET
Basophils Absolute: 0 10*3/uL (ref 0.0–0.1)
Basophils Relative: 0 %
Eosinophils Absolute: 0.2 10*3/uL (ref 0.0–0.7)
Eosinophils Relative: 1 %
HEMATOCRIT: 31.1 % — AB (ref 39.0–52.0)
HEMOGLOBIN: 10.3 g/dL — AB (ref 13.0–17.0)
LYMPHS ABS: 0.9 10*3/uL (ref 0.7–4.0)
Lymphocytes Relative: 8 %
MCH: 26.6 pg (ref 26.0–34.0)
MCHC: 33.1 g/dL (ref 30.0–36.0)
MCV: 80.4 fL (ref 78.0–100.0)
MONO ABS: 0.9 10*3/uL (ref 0.1–1.0)
Monocytes Relative: 8 %
NEUTROS ABS: 10 10*3/uL — AB (ref 1.7–7.7)
Neutrophils Relative %: 83 %
Platelets: 168 10*3/uL (ref 150–400)
RBC: 3.87 MIL/uL — ABNORMAL LOW (ref 4.22–5.81)
RDW: 15.8 % — AB (ref 11.5–15.5)
WBC: 12 10*3/uL — ABNORMAL HIGH (ref 4.0–10.5)

## 2017-02-22 LAB — BASIC METABOLIC PANEL
Anion gap: 9 (ref 5–15)
BUN: 60 mg/dL — AB (ref 6–20)
CALCIUM: 7.6 mg/dL — AB (ref 8.9–10.3)
CO2: 23 mmol/L (ref 22–32)
CREATININE: 3.84 mg/dL — AB (ref 0.61–1.24)
Chloride: 103 mmol/L (ref 101–111)
GFR calc Af Amer: 15 mL/min — ABNORMAL LOW (ref >60)
GFR calc non Af Amer: 13 mL/min — ABNORMAL LOW (ref >60)
GLUCOSE: 194 mg/dL — AB (ref 65–99)
Potassium: 3.8 mmol/L (ref 3.5–5.1)
Sodium: 135 mmol/L (ref 135–145)

## 2017-02-22 LAB — MAGNESIUM: MAGNESIUM: 2.5 mg/dL — AB (ref 1.7–2.4)

## 2017-02-22 LAB — PHOSPHORUS: Phosphorus: 3.6 mg/dL (ref 2.5–4.6)

## 2017-02-22 LAB — GLUCOSE, CAPILLARY
GLUCOSE-CAPILLARY: 225 mg/dL — AB (ref 65–99)
Glucose-Capillary: 179 mg/dL — ABNORMAL HIGH (ref 65–99)
Glucose-Capillary: 235 mg/dL — ABNORMAL HIGH (ref 65–99)

## 2017-02-22 MED ORDER — BISACODYL 10 MG RE SUPP
10.0000 mg | Freq: Every day | RECTAL | Status: DC | PRN
  Administered 2017-02-22: 10 mg via RECTAL
  Filled 2017-02-22: qty 1

## 2017-02-22 MED ORDER — HEPARIN SODIUM (PORCINE) 5000 UNIT/ML IJ SOLN
5000.0000 [IU] | Freq: Two times a day (BID) | INTRAMUSCULAR | Status: DC
  Administered 2017-02-22 – 2017-02-25 (×7): 5000 [IU] via SUBCUTANEOUS
  Filled 2017-02-22 (×7): qty 1

## 2017-02-22 MED ORDER — FUROSEMIDE 10 MG/ML IJ SOLN
20.0000 mg | Freq: Once | INTRAMUSCULAR | Status: AC
  Administered 2017-02-22: 20 mg via INTRAVENOUS
  Filled 2017-02-22: qty 2

## 2017-02-22 MED ORDER — FUROSEMIDE 10 MG/ML IJ SOLN
40.0000 mg | Freq: Once | INTRAMUSCULAR | Status: AC
  Administered 2017-02-22: 40 mg via INTRAVENOUS
  Filled 2017-02-22: qty 4

## 2017-02-22 NOTE — Progress Notes (Addendum)
Inpatient Diabetes Program Recommendations  AACE/ADA: New Consensus Statement on Inpatient Glycemic Control (2015)  Target Ranges:  Prepandial:   less than 140 mg/dL      Peak postprandial:   less than 180 mg/dL (1-2 hours)      Critically ill patients:  140 - 180 mg/dL   Results for [name], [name] (MRN [id number]) as of 02/22/2017 12:26  Ref. Range 02/21/2017 08:05 02/21/2017 11:14 02/21/2017 16:33 02/21/2017 20:46  Glucose-Capillary Latest Ref Range: 65 - 99 mg/dL 193 (H) 216 (H) 214 (H) 189 (H)   Results for [name], [name] (MRN [id number]) as of 02/22/2017 12:26  Ref. Range 02/22/2017 07:25 02/22/2017 12:12  Glucose-Capillary Latest Ref Range: 65 - 99 mg/dL 179 (H) 235 (H)    Admit with: Partial Nephrectomy  History: DM  Home DM Meds: Levemir 30 units BID        Humalog 10 units Q Supper  Current Insulin Orders: Novolog Moderate Correction Scale/ SSI (0-15 units) TID AC      MD- Please consider the following in-hospital insulin adjustments:  1. Start Levemir 10 units daily (0.1 units/kg dosing).  This is a fraction of what patient takes at home but considering his renal function, it may be safer to start with a weight based dose at first.  2. Start low dose Novolog Meal Coverage: Novolog 3 units TID with meals (hold if pt eats <50% of meal)      --Will follow patient during hospitalization--  Wyn Quaker RN, MSN, CDE Diabetes Coordinator Inpatient Glycemic Control Team Team Pager: [phone] (8a-5p)

## 2017-02-22 NOTE — Progress Notes (Signed)
Tried to wean oxygen down to 2L and patient's oxygen sat still in the 80s at rest. Will continue to assess patient.

## 2017-02-22 NOTE — Evaluation (Signed)
Physical Therapy Evaluation Patient Details Name: [name] MRN: [id number] DOB: [birth year] Today's Date: 02/22/2017   History of Present Illness  83 year old admitted w/ significant h/o right renal mass & chronic calculus cholecystitis and also CAD (w/ prior CABG).  Now s/p elective robotic assisted partial right Nephrectomy and robotic assisted laparoscopic cholecystectomy on 02/19/2017 . Pt found to be hyxpoxemic and had acute post op hypoxemic resp failure  Clinical Impression  Pt admitted with above diagnosis. Pt currently with functional limitations due to the deficits listed below (see PT Problem List).  Pt will benefit from skilled PT to increase their independence and safety with mobility to allow discharge to the venue listed below.   Pt tolerated good distance of ambulation in hallway today.  Will continue to assist with mobility in acute setting however no f/u PT needs anticipated.     Follow Up Recommendations No PT follow up    Equipment Recommendations  None recommended by PT    Recommendations for Other Services       Precautions / Restrictions Precautions Precautions: Fall Precaution Comments: monitor sats      Mobility  Bed Mobility               General bed mobility comments: pt up in recliner on arrival  Transfers Overall transfer level: Needs assistance Equipment used: Rolling walker (2 wheeled) Transfers: Sit to/from Stand Sit to Stand: Min guard         General transfer comment: verbal cues for hand placement  Ambulation/Gait Ambulation/Gait assistance: Min guard Ambulation Distance (Feet): 250 Feet   Gait Pattern/deviations: Step-through pattern;Decreased stride length     General Gait Details: verbal cues for safe use of RW, pt steady with RW, Spo2 reading 86% on 3L O2 however also poor waveform  Stairs            Wheelchair Mobility    Modified Rankin (Stroke Patients Only)       Balance Overall balance assessment:   (denies fall hx)                                           Pertinent Vitals/Pain Pain Assessment: No/denies pain    Home Living Family/patient expects to be discharged to:: Private residence Living Arrangements: Spouse/significant other   Type of Home: [place]: One level Home Equipment: Environmental consultant - 2 wheels      Prior Function Level of Independence: Independent               Hand Dominance        Extremity/Trunk Assessment        Lower Extremity Assessment Lower Extremity Assessment: Generalized weakness       Communication   Communication: No difficulties  Cognition Arousal/Alertness: Awake/alert Behavior During Therapy: WFL for tasks assessed/performed Overall Cognitive Status: Within Functional Limits for tasks assessed                      General Comments      Exercises     Assessment/Plan    PT Assessment Patient needs continued PT services  PT Problem List Decreased strength;Decreased mobility;Decreased activity tolerance;Decreased knowledge of use of DME;Cardiopulmonary status limiting activity       PT Treatment Interventions DME instruction;Therapeutic activities;Therapeutic exercise;Gait training;Functional mobility training;Patient/family education    PT Goals (  Current goals can be found in the Care Plan section)  Acute Rehab PT Goals PT Goal Formulation: With patient Time For Goal Achievement: 03/01/17 Potential to Achieve Goals: Good    Frequency Min 3X/week   Barriers to discharge        Co-evaluation               End of Session Equipment Utilized During Treatment: Gait belt;Oxygen Activity Tolerance: Patient tolerated treatment well Patient left: with call bell/phone within reach;with chair alarm set;in chair;with nursing/sitter in room Nurse Communication: Mobility status PT Visit Diagnosis: Difficulty in walking, not elsewhere classified (R26.2)         Time:  1036-1050 PT Time Calculation (min) (ACUTE ONLY): 14 min   Charges:   PT Evaluation $PT Eval Low Complexity: 1 Procedure     PT G Codes:         Sadhana Frater,KATHrine E 02/22/2017, 12:49 PM Carmelia Bake, PT, DPT 02/22/2017 Pager: [phone]

## 2017-02-22 NOTE — Discharge Instructions (Signed)
1.  Activity:  You are encouraged to ambulate frequently (about every hour during waking hours) to help prevent blood clots from forming in your legs or lungs.  However, you should not engage in any heavy lifting (> 10-15 lbs), strenuous activity, or straining. 2. Diet: You should advance your diet as instructed by your physician.  It will be normal to have some bloating, nausea, and abdominal discomfort intermittently. 3. Prescriptions:  You will be provided a prescription for pain medication to take as needed.  If your pain is not severe enough to require the prescription pain medication, you may take extra strength Tylenol instead which will have less side effects.  You should also take a prescribed stool softener to avoid straining with bowel movements as the prescription pain medication may constipate you. 4. Incisions: You may remove your dressing bandages 48 hours after surgery if not removed in the hospital.  You will either have some small staples or special tissue glue at each of the incision sites. Once the bandages are removed (if present), the incisions may stay open to air.  You may start showering (but not soaking or bathing in water) the 2nd day after surgery and the incisions simply need to be patted dry after the shower.  No additional care is needed. 5. What to call us about: You should call the office [phone]) if you develop fever > 101 or develop persistent vomiting.  You may resume aspirin, advil, aleve, vitamins, and supplements 7 days after surgery.        LAPAROSCOPIC SURGERY: POST OP INSTRUCTIONS  ######################################################################  EAT Gradually transition to a high fiber diet with a fiber supplement over the next few weeks after discharge.  Start with a pureed / full liquid diet (see below)  WALK Walk an hour a day.  Control your pain to do that.    CONTROL PAIN Control pain so that you can walk, sleep, tolerate  sneezing/coughing, go up/down stairs.  HAVE A BOWEL MOVEMENT DAILY Keep your bowels regular to avoid problems.  OK to try a laxative to override constipation.  OK to use an antidairrheal to slow down diarrhea.  Call if not better after 2 tries  CALL IF YOU HAVE PROBLEMS/CONCERNS Call if you are still struggling despite following these instructions. Call if you have concerns not answered by these instructions  ######################################################################    1. DIET: Follow a light bland diet the first 24 hours after arrival home, such as soup, liquids, crackers, etc.  Be sure to include lots of fluids daily.  Avoid fast food or heavy meals as your are more likely to get nauseated.  Eat a low fat the next few days after surgery.   2. Take your usually prescribed home medications unless otherwise directed. 3. PAIN CONTROL: a. Pain is best controlled by a usual combination of three different methods TOGETHER: i. Ice/Heat ii. Over the counter pain medication iii. Prescription pain medication b. Most patients will experience some swelling and bruising around the incisions.  Ice packs or heating pads (30-60 minutes up to 6 times a day) will help. Use ice for the first few days to help decrease swelling and bruising, then switch to heat to help relax tight/sore spots and speed recovery.  Some people prefer to use ice alone, heat alone, alternating between ice & heat.  Experiment to what works for you.  Swelling and bruising can take several weeks to resolve.   i. It is helpful to take an over-the-counter pain medication  regularly for the first few weeks.  Choose Acetaminophen (Tylenol, etc) 500-650mg  four times a day (every meal & bedtime) c. A  prescription for pain medication (such as oxycodone, hydrocodone, etc) should be given to you upon discharge.  Take your pain medication as prescribed.  i. If you are having problems/concerns with the prescription medicine (does not  control pain, nausea, vomiting, rash, itching, etc), please call us [phone] to see if we need to switch you to a different pain medicine that will work better for you and/or control your side effect better. ii. If you need a refill on your pain medication, please contact your pharmacy.  They will contact our office to request authorization. Prescriptions will not be filled after 5 pm or on week-ends. 4. Avoid getting constipated.  Between the surgery and the pain medications, it is common to experience some constipation.  Increasing fluid intake and taking a fiber supplement (such as Metamucil, Citrucel, FiberCon, MiraLax, etc) 1-2 times a day regularly will usually help prevent this problem from occurring.  A mild laxative (prune juice, Milk of Magnesia, MiraLax, etc) should be taken according to package directions if there are no bowel movements after 48 hours.   5. Watch out for diarrhea.  If you have many loose bowel movements, simplify your diet to bland foods & liquids for a few days.  Stop any stool softeners and decrease your fiber supplement.  Switching to mild anti-diarrheal medications (Kayopectate, Pepto Bismol) can help.  If this worsens or does not improve, please call us. 6. Wash / shower every day.  You may shower over the dressings as they are waterproof.  Continue to shower over incision(s) after the dressing is off. 7. Remove your waterproof bandages 5 days after surgery.  You may leave the incision open to air.  You may replace a dressing/Band-Aid to cover the incision for comfort if you wish.  8. ACTIVITIES as tolerated:   a. You may resume regular (light) daily activities beginning the next day--such as daily self-care, walking, climbing stairs--gradually increasing activities as tolerated.  If you can walk 30 minutes without difficulty, it is safe to try more intense activity such as jogging, treadmill, bicycling, low-impact aerobics, swimming, etc. b. Save the most intensive  and strenuous activity for last such as sit-ups, heavy lifting, contact sports, etc  Refrain from any heavy lifting or straining until you are off narcotics for pain control.   c. DO NOT PUSH THROUGH PAIN.  Let pain be your guide: If it hurts to do something, don't do it.  Pain is your body warning you to avoid that activity for another week until the pain goes down. d. You may drive when you are no longer taking prescription pain medication, you can comfortably wear a seatbelt, and you can safely maneuver your car and apply brakes. e. [name] Bast may have sexual intercourse when it is comfortable.  9. FOLLOW UP in our office a. Please call [facility] at [phone] to set up an appointment to see your surgeon in the office for a follow-up appointment approximately 2-3 weeks after your surgery. b. Make sure that you call for this appointment the day you arrive home to insure a convenient appointment time. 10. IF YOU HAVE DISABILITY OR FAMILY LEAVE FORMS, BRING THEM TO THE OFFICE FOR PROCESSING.  DO NOT GIVE THEM TO YOUR DOCTOR.   WHEN TO CALL us [phone]: 1. Poor pain control 2. Reactions / problems with new medications (rash/itching, nausea, etc)  3. Fever over 101.5 F (38.5 C) 4. Inability to urinate 5. Nausea and/or vomiting 6. Worsening swelling or bruising 7. Continued bleeding from incision. 8. Increased pain, redness, or drainage from the incision   The clinic staff is available to answer your questions during regular business hours (8:30am-5pm).  Please dont hesitate to call and ask to speak to one of our nurses for clinical concerns.   If you have a medical emergency, go to the nearest emergency room or call 911.  A surgeon from [facility] is always on call at the [facility], [place], [place], [place], [place]  [address] ? MAIN: [phone] ? TOLL FREE: [phone] ?  FAX [phone]  [phone] [email or web address]   Cholecystitis Cholecystitis is inflammation of the gallbladder. It is often called a gallbladder attack. The gallbladder is a pear-shaped organ that lies beneath the liver on the right side of the body. The gallbladder stores bile, which is a fluid that helps the body to digest fats. If bile builds up in your gallbladder, your gallbladder becomes inflamed. This condition may occur suddenly (be acute). Repeat episodes of acute cholecystitis or prolonged episodes may lead to a long-term (chronic) condition. Cholecystitis is serious and it requires treatment. What are the causes? The most common cause of this condition is gallstones. Gallstones can block the tube (duct) that carries bile out of your gallbladder. This causes bile to build up. Other causes of this condition include:  Damage to the gallbladder due to a decrease in blood flow.  Infections in the bile ducts.  Scars or kinks in the bile ducts.  Tumors in the liver, pancreas, or gallbladder. What increases the risk? This condition is more likely to develop in:  People who have sickle cell disease.  People who take birth control pills or use estrogen.  People who have alcoholic liver disease.  People who have liver cirrhosis.  People who have their nutrition delivered through a vein (parenteral nutrition).  People who do not eat or drink (do fasting) for a long period of time.  People who are obese.  People who have rapid weight loss.  People who are pregnant.  People who have increased triglyceride levels.  People who have pancreatitis. What are the signs or symptoms? Symptoms of this condition include:  Abdominal pain, especially in the upper right area of the abdomen.  Abdominal tenderness or bloating.  Nausea.  Vomiting.  Fever.  Chills.  Yellowing of the skin and the whites of the eyes (jaundice). How is this diagnosed? This condition is diagnosed with a  medical history and physical exam. You may also have other tests, including:  Imaging tests, such as:  An ultrasound of the gallbladder.  A CT scan of the abdomen.  A gallbladder nuclear scan (HIDA scan). This scan allows your health care provider to see the bile moving from your liver to your gallbladder and to your small intestine.  MRI.  Blood tests, such as:  A complete blood count, because the white blood cell count may be higher than normal.  Liver function tests, because some levels may be higher than normal with certain types of gallstones. How is this treated? Treatment may include:  Fasting for a certain amount of time.  IV fluids.  Medicine to treat pain or vomiting.  Antibiotic medicine.  Surgery to remove your gallbladder (cholecystectomy). This may happen immediately or at a later time. Follow these instructions at home: Home care  will depend on your treatment. In general:  Take over-the-counter and prescription medicines only as told by your health care provider.  If you were prescribed an antibiotic medicine, take it as told by your health care provider. Do not stop taking the antibiotic even if you start to feel better.  Follow instructions from your health care provider about what to eat or drink. When you are allowed to eat, avoid eating or drinking anything that triggers your symptoms.  Keep all follow-up visits as told by your health care provider. This is important. Contact a health care provider if:  Your pain is not controlled with medicine.  You have a fever. Get help right away if:  Your pain moves to another part of your abdomen or to your back.  You continue to have symptoms or you develop new symptoms even with treatment. This information is not intended to replace advice given to you by your health care provider. Make sure you discuss any questions you have with your health care provider. Document Released: 11/23/2005 Document Revised:  04/02/2016 Document Reviewed: 03/06/2015 Elsevier Interactive Patient Education  2017 [employer].

## 2017-02-22 NOTE — Progress Notes (Signed)
3 Days Post-Op Subjective: No complaints No acute events No BM, persistent O2 requirement  Objective: Vital signs in last 24 hours: Temp:  [98.4 F (36.9 C)-99.9 F (37.7 C)] 99.9 F (37.7 C) (03/19 0434) Pulse Rate:  [54-57] 54 (03/19 0434) Resp:  [17-20] 17 (03/19 0434) BP: (104-145)/(51-59) 117/59 (03/19 0434) SpO2:  [74 %-96 %] 94 % (03/19 0434)  Intake/Output from previous day: 03/18 0701 - 03/19 0700 In: 2796 [P.O.:600; I.V.:2196] Out: 1210 [Urine:1150; Drains:60] Intake/Output this shift: No intake/output data recorded.  Physical Exam:  General:alert, cooperative and appears stated age  Left lower lungs with fluid, right lungs clear GI: soft, non tender, Faint bowel sounds, no palpable masses, no organomegaly, no inguinal hernia Male genitalia: not done Extremities: extremities normal, atraumatic, no cyanosis or edema  Lab Results:  Recent Labs  02/20/17 0518 02/21/17 0342 02/22/17 0528  HGB 13.1 11.0* 10.3*  HCT 39.3 32.3* 31.1*   BMET  Recent Labs  02/21/17 0342 02/22/17 0528  NA 136 135  K 4.2 3.8  CL 102 103  CO2 24 23  GLUCOSE 195* 194*  BUN 53* 60*  CREATININE 3.79* 3.84*  CALCIUM 7.7* 7.6*   No results for input(s): LABPT, INR in the last 72 hours. No results for input(s): LABURIN in the last 72 hours. Results for orders placed or performed during the hospital encounter of 05/12/13  Urine culture     Status: None   Collection Time: 05/12/13  3:43 PM  Result Value Ref Range Status   Specimen Description URINE, CLEAN CATCH  Final   Special Requests NONE  Final   Culture  Setup Time 05/12/2013 16:15  Final   Colony Count 3,000 COLONIES/ML  Final   Culture INSIGNIFICANT GROWTH  Final   Report Status 05/13/2013 FINAL  Final  MRSA PCR Screening     Status: None   Collection Time: 05/12/13  7:47 PM  Result Value Ref Range Status   MRSA by PCR NEGATIVE NEGATIVE Final    Comment:        The GeneXpert MRSA Assay (FDA approved for NASAL  specimens only), is one component of a comprehensive MRSA colonization surveillance program. It is not intended to diagnose MRSA infection nor to guide or monitor treatment for MRSA infections.    Studies/Results: Dg Chest Port 1 View  Result Date: 02/21/2017 CLINICAL DATA:  Shortness of breath today per EXAM: PORTABLE CHEST 1 VIEW COMPARISON:  02/20/2017 FINDINGS: Sternotomy wires unchanged. Lungs are hypoinflated with hazy bilateral perihilar and bibasilar opacification without significant change. Findings likely due to mild interstitial edema with small left effusion/atelectasis although cannot exclude infection in the lung bases. Stable cardiomegaly. Remainder of the exam is unchanged. IMPRESSION: Stable bilateral perihilar and bibasilar opacification suggesting mild interstitial edema with left effusions/atelectasis. Cannot exclude infection in the lung bases. Stable cardiomegaly. Electronically Signed   By: Marin Olp M.D.   On: 02/21/2017 10:22    Assessment 84yo POD #2 partial nephrectomy Persistent O2 requirement - CHF - challenging fluid management.  Some pulmonary congestion on auscultation today, UOP adequate 800cc postitive in 24 hour hours. Persistent ileus - post-op Acute on chronic renal failure - ? Over diuresis  PLAN: HLIVF - 20mg  IV lasix, CXR to eval for pulm edema PT/OT - needs to be up most of day today Regular diet, dulculax supp [facility] Consider nephrology consult if renal function doesn't turn around tomorrow AM.    LOS: 3 days   Louis Meckel W 02/22/2017, 8:14 AM

## 2017-02-23 ENCOUNTER — Inpatient Hospital Stay (HOSPITAL_COMMUNITY): Payer: Medicare Other

## 2017-02-23 LAB — BASIC METABOLIC PANEL
Anion gap: 7 (ref 5–15)
BUN: 62 mg/dL — ABNORMAL HIGH (ref 6–20)
CALCIUM: 7.9 mg/dL — AB (ref 8.9–10.3)
CO2: 25 mmol/L (ref 22–32)
CREATININE: 3.41 mg/dL — AB (ref 0.61–1.24)
Chloride: 107 mmol/L (ref 101–111)
GFR calc non Af Amer: 15 mL/min — ABNORMAL LOW (ref >60)
GFR, EST AFRICAN AMERICAN: 18 mL/min — AB (ref >60)
Glucose, Bld: 183 mg/dL — ABNORMAL HIGH (ref 65–99)
Potassium: 3.9 mmol/L (ref 3.5–5.1)
SODIUM: 139 mmol/L (ref 135–145)

## 2017-02-23 LAB — GLUCOSE, CAPILLARY
GLUCOSE-CAPILLARY: 195 mg/dL — AB (ref 65–99)
Glucose-Capillary: 210 mg/dL — ABNORMAL HIGH (ref 65–99)
Glucose-Capillary: 195 mg/dL — ABNORMAL HIGH (ref 65–99)
Glucose-Capillary: 197 mg/dL — ABNORMAL HIGH (ref 65–99)
Glucose-Capillary: 182 mg/dL — ABNORMAL HIGH (ref 65–99)

## 2017-02-23 LAB — PHOSPHORUS: Phosphorus: 3.6 mg/dL (ref 2.5–4.6)

## 2017-02-23 LAB — MAGNESIUM: MAGNESIUM: 2.5 mg/dL — AB (ref 1.7–2.4)

## 2017-02-23 MED ORDER — INSULIN ASPART 100 UNIT/ML ~~LOC~~ SOLN
3.0000 [IU] | Freq: Three times a day (TID) | SUBCUTANEOUS | Status: DC
  Administered 2017-02-23: 3 [IU] via SUBCUTANEOUS

## 2017-02-23 MED ORDER — INSULIN DETEMIR 100 UNIT/ML ~~LOC~~ SOLN
10.0000 [IU] | Freq: Every day | SUBCUTANEOUS | Status: DC
  Administered 2017-02-23 – 2017-02-25 (×3): 10 [IU] via SUBCUTANEOUS
  Filled 2017-02-23 (×3): qty 0.1

## 2017-02-23 MED ORDER — TRAMADOL HCL 50 MG PO TABS: 50.0000 mg | ORAL_TABLET | Freq: Four times a day (QID) | ORAL | Status: DC | PRN

## 2017-02-23 MED ORDER — FUROSEMIDE 10 MG/ML IJ SOLN
20.0000 mg | Freq: Two times a day (BID) | INTRAMUSCULAR | Status: DC
  Administered 2017-02-23 – 2017-02-25 (×5): 20 mg via INTRAVENOUS
  Filled 2017-02-23 (×6): qty 2

## 2017-02-23 MED ORDER — DOCUSATE SODIUM 100 MG PO CAPS
100.0000 mg | ORAL_CAPSULE | Freq: Two times a day (BID) | ORAL | Status: DC
  Administered 2017-02-23 – 2017-02-25 (×5): 100 mg via ORAL
  Filled 2017-02-23 (×5): qty 1

## 2017-02-23 NOTE — Progress Notes (Signed)
4 Days Post-Op Subjective:   Objective: Vital signs in last 24 hours: Temp:  [98.2 F (36.8 C)-99.5 F (37.5 C)] 98.7 F (37.1 C) (03/20 0608) Pulse Rate:  [54-58] 55 (03/20 0608) Resp:  [18-19] 18 (03/20 0608) BP: (122-140)/(60-66) 130/61 (03/20 0608) SpO2:  [92 %-96 %] 92 % (03/20 2956)  Intake/Output from previous day: 03/19 0701 - 03/20 0700 In: -  Out: 1710 [OZHYQ:6578; Drains:35] Intake/Output this shift: No intake/output data recorded.  Physical Exam:  General:alert, cooperative and appears stated age  Lungs are non-labored GI: soft, non tender,incisions are c/d/i Male genitalia: not done Extremities: extremities normal, atraumatic, no cyanosis or edema  Lab Results:  Recent Labs  02/21/17 0342 02/22/17 0528  HGB 11.0* 10.3*  HCT 32.3* 31.1*   BMET  Recent Labs  02/22/17 0528 02/23/17 0503  NA 135 139  K 3.8 3.9  CL 103 107  CO2 23 25  GLUCOSE 194* 183*  BUN 60* 62*  CREATININE 3.84* 3.41*  CALCIUM 7.6* 7.9*   No results for input(s): LABPT, INR in the last 72 hours. No results for input(s): LABURIN in the last 72 hours. Results for orders placed or performed during the hospital encounter of 05/12/13  Urine culture     Status: None   Collection Time: 05/12/13  3:43 PM  Result Value Ref Range Status   Specimen Description URINE, CLEAN CATCH  Final   Special Requests NONE  Final   Culture  Setup Time 05/12/2013 16:15  Final   Colony Count 3,000 COLONIES/ML  Final   Culture INSIGNIFICANT GROWTH  Final   Report Status 05/13/2013 FINAL  Final  MRSA PCR Screening     Status: None   Collection Time: 05/12/13  7:47 PM  Result Value Ref Range Status   MRSA by PCR NEGATIVE NEGATIVE Final    Comment:        The GeneXpert MRSA Assay (FDA approved for NASAL specimens only), is one component of a comprehensive MRSA colonization surveillance program. It is not intended to diagnose MRSA infection nor to guide or monitor treatment for MRSA  infections.    Studies/Results: Dg Chest 1 View  Result Date: 02/22/2017 CLINICAL DATA:  Shortness of breath . EXAM: CHEST 1 VIEW COMPARISON:  02/21/2017 . FINDINGS: Prior CABG. Cardiomegaly with pulmonary vascular prominence and bilateral pulmonary infiltrates with bilateral pleural effusions. Findings consistent with CHF. No significant improved from prior exam. Persistent low lung volumes. No pneumothorax P IMPRESSION: Congestive heart failure bilateral pulmonary edema and bilateral pleural effusions. Persistent low lung volumes. No significant improvement from prior exam. Electronically Signed   By: Marcello Moores  Register   On: 02/22/2017 09:31   Dg Chest Port 1 View  Result Date: 02/23/2017 CLINICAL DATA:  83 year old male status post partial right nephrectomy for renal mass, postop day 4. Shortness of breath. EXAM: PORTABLE CHEST 1 VIEW COMPARISON:  02/22/2017 and earlier. FINDINGS: Portable AP semi upright view at 0505 hours. Stable cardiomegaly and mediastinal contours. Previous CABG. Continued increased bilateral pulmonary interstitial markings or vascular congestion with patchy bibasilar opacity which has mildly improved since 02/19/2017. No pneumothorax. No definite pleural effusion. IMPRESSION: 1. Continued increased bilateral pulmonary interstitial opacity. Patchy and confluent bibasilar opacity has mildly improved since 02/19/2017. 2. Differential considerations include pulmonary edema with atelectasis and viral/atypical respiratory infection. Electronically Signed   By: Genevie Ann M.D.   On: 02/23/2017 07:51   Dg Chest Port 1 View  Result Date: 02/21/2017 CLINICAL DATA:  Shortness of breath today per EXAM:  PORTABLE CHEST 1 VIEW COMPARISON:  02/20/2017 FINDINGS: Sternotomy wires unchanged. Lungs are hypoinflated with hazy bilateral perihilar and bibasilar opacification without significant change. Findings likely due to mild interstitial edema with small left effusion/atelectasis although cannot  exclude infection in the lung bases. Stable cardiomegaly. Remainder of the exam is unchanged. IMPRESSION: Stable bilateral perihilar and bibasilar opacification suggesting mild interstitial edema with left effusions/atelectasis. Cannot exclude infection in the lung bases. Stable cardiomegaly. Electronically Signed   By: Marin Olp M.D.   On: 02/21/2017 10:22    Assessment 84yo POD #4 partial nephrectomy Persistent O2 requirement - CHF - improving chest xray, subjectively breathing better.  Fluid negative in past 24 hours Persistent ileus - resolved, tolerating regular diet Acute on chronic renal failure - improving, continue to monitor  PLAN: HLIVF - 20mg  IV lasix BID, repeat CXR in AM.  Wean O2/IS D/c JP drain. PT/OT - needs to be up most of day today Regular diet, dulculax supp [facility] If he continues to progress, potentially d/c tomorrow.  Needs to wean O2     LOS: 4 days   Louis Meckel W 02/23/2017, 8:01 AM

## 2017-02-23 NOTE — Care Management Important Message (Signed)
Important Message  Patient Details  Name: HUEL CENTOLA MRN: [id number] Date of Birth: 1932-05-10   Medicare Important Message Given:  Yes    Kerin Salen 02/23/2017, 10:04 AMImportant Message  Patient Details  Name: JAYVEON CONVEY MRN: [id number] Date of Birth: [birth year]   Medicare Important Message Given:  Yes    Kerin Salen 02/23/2017, 10:04 AM

## 2017-02-23 NOTE — Progress Notes (Signed)
Inpatient Diabetes Program Recommendations  AACE/ADA: New Consensus Statement on Inpatient Glycemic Control (2015)  Target Ranges:  Prepandial:   less than 140 mg/dL      Peak postprandial:   less than 180 mg/dL (1-2 hours)      Critically ill patients:  140 - 180 mg/dL   Results for BIRDIE, BEVERIDGE (MRN [id number]) as of 02/23/2017 10:07  Ref. Range 02/22/2017 07:25 02/22/2017 12:12 02/22/2017 17:03  Glucose-Capillary Latest Ref Range: 65 - 99 mg/dL 179 (H) 235 (H) 225 (H)   Results for JAYMIE, MISCH (MRN [id number]) as of 02/23/2017 10:07  Ref. Range 02/23/2017 07:38  Glucose-Capillary Latest Ref Range: 65 - 99 mg/dL 195 (H)    Admit with: Partial Nephrectomy  History: DM  Home DM Meds: Levemir 30 units BID                              Humalog 10 units Q Supper  Current Insulin Orders: Novolog Moderate Correction Scale/ SSI (0-15 units) TID AC      MD- Please consider the following in-hospital insulin adjustments:  1. Start Levemir 10 units daily (0.1 units/kg dosing).  This is a fraction of what patient takes at home but considering his renal function, it may be safer to start with a weight based dose at first.  2. Start low dose Novolog Meal Coverage: Novolog 3 units TID with meals (hold if pt eats <50% of meal)     --Will follow patient during hospitalization--  Wyn Quaker RN, MSN, CDE Diabetes Coordinator Inpatient Glycemic Control Team Team Pager: [phone] (8a-5p)

## 2017-02-24 ENCOUNTER — Inpatient Hospital Stay (HOSPITAL_COMMUNITY): Payer: Medicare Other

## 2017-02-24 LAB — BASIC METABOLIC PANEL
ANION GAP: 8 (ref 5–15)
BUN: 48 mg/dL — AB (ref 6–20)
CO2: 25 mmol/L (ref 22–32)
Calcium: 8 mg/dL — ABNORMAL LOW (ref 8.9–10.3)
Chloride: 105 mmol/L (ref 101–111)
Creatinine, Ser: 2.87 mg/dL — ABNORMAL HIGH (ref 0.61–1.24)
GFR calc Af Amer: 22 mL/min — ABNORMAL LOW (ref >60)
GFR, EST NON AFRICAN AMERICAN: 19 mL/min — AB (ref >60)
Glucose, Bld: 170 mg/dL — ABNORMAL HIGH (ref 65–99)
POTASSIUM: 3.8 mmol/L (ref 3.5–5.1)
SODIUM: 138 mmol/L (ref 135–145)

## 2017-02-24 LAB — CBC
HEMATOCRIT: 32 % — AB (ref 39.0–52.0)
HEMOGLOBIN: 10.8 g/dL — AB (ref 13.0–17.0)
MCH: 27.3 pg (ref 26.0–34.0)
MCHC: 33.8 g/dL (ref 30.0–36.0)
MCV: 80.8 fL (ref 78.0–100.0)
Platelets: 178 10*3/uL (ref 150–400)
RBC: 3.96 MIL/uL — ABNORMAL LOW (ref 4.22–5.81)
RDW: 15.2 % (ref 11.5–15.5)
WBC: 6.9 10*3/uL (ref 4.0–10.5)

## 2017-02-24 LAB — GLUCOSE, CAPILLARY
GLUCOSE-CAPILLARY: 81 mg/dL (ref 65–99)
Glucose-Capillary: 176 mg/dL — ABNORMAL HIGH (ref 65–99)
Glucose-Capillary: 142 mg/dL — ABNORMAL HIGH (ref 65–99)
Glucose-Capillary: 137 mg/dL — ABNORMAL HIGH (ref 65–99)

## 2017-02-24 NOTE — Progress Notes (Addendum)
5 Days Post-Op Subjective: Patient reports feeling well. Passing flatus. One BM post op.  No N/V.  Denies SOB.  Has ambulated today  Objective: Vital signs in last 24 hours: Temp:  [98.6 F (37 C)-99.2 F (37.3 C)] 98.6 F (37 C) (03/21 0451) Pulse Rate:  [52-53] 52 (03/21 0451) Resp:  [18-22] 22 (03/21 0451) BP: (122-140)/(60-68) 135/61 (03/21 0451) SpO2:  [78 %-95 %] 94 % (03/21 0451) Weight:  [89.9 kg (198 lb 3.1 oz)-91.6 kg (201 lb 14.4 oz)] 89.9 kg (198 lb 3.1 oz) (03/21 0451)  Intake/Output from previous day: 03/20 0701 - 03/21 0700 In: -  Out: 2230 [Urine:2225; Drains:5] Intake/Output this shift: Total I/O In: 240 [P.O.:240] Out: 200 [Urine:200]  Physical Exam:  General:alert, cooperative and no distress  CV: RRR Lungs: crackles bilateral bases otherwise CTA; non labored breathing on 2L O2 via [place] GI: soft, non tender, normal bowel sounds, no palpable masses   Lab Results:  Recent Labs  02/22/17 0528 02/24/17 0540  HGB 10.3* 10.8*  HCT 31.1* 32.0*   BMET  Recent Labs  02/23/17 0503 02/24/17 0540  NA 139 138  K 3.9 3.8  CL 107 105  CO2 25 25  GLUCOSE 183* 170*  BUN 62* 48*  CREATININE 3.41* 2.87*  CALCIUM 7.9* 8.0*   Lab Results  Component Value Date   WBC 6.9 02/24/2017   HGB 10.8 (L) 02/24/2017   HCT 32.0 (L) 02/24/2017   MCV 80.8 02/24/2017   PLT 178 02/24/2017   Results for orders placed or performed during the hospital encounter of 05/12/13  Urine culture     Status: None   Collection Time: 05/12/13  3:43 PM  Result Value Ref Range Status   Specimen Description URINE, CLEAN CATCH  Final   Special Requests NONE  Final   Culture  Setup Time 05/12/2013 16:15  Final   Colony Count 3,000 COLONIES/ML  Final   Culture INSIGNIFICANT GROWTH  Final   Report Status 05/13/2013 FINAL  Final  MRSA PCR Screening     Status: None   Collection Time: 05/12/13  7:47 PM  Result Value Ref Range Status   MRSA by PCR NEGATIVE NEGATIVE Final    Comment:         The GeneXpert MRSA Assay (FDA approved for NASAL specimens only), is one component of a comprehensive MRSA colonization surveillance program. It is not intended to diagnose MRSA infection nor to guide or monitor treatment for MRSA infections.    Studies/Results: Dg Chest Port 1 View  Result Date: 02/24/2017 CLINICAL DATA:  Short of breath EXAM: PORTABLE CHEST 1 VIEW COMPARISON:  Yesterday FINDINGS: The heart is moderately enlarged. Diffuse airspace disease is improved no pneumothorax. Vascular congestion. Postoperative changes from CABG. IMPRESSION: Improved pulmonary edema. Electronically Signed   By: Marybelle Killings M.D.   On: 02/24/2017 08:05   Dg Chest Port 1 View  Result Date: 02/23/2017 CLINICAL DATA:  83 year old male status post partial right nephrectomy for renal mass, postop day 4. Shortness of breath. EXAM: PORTABLE CHEST 1 VIEW COMPARISON:  02/22/2017 and earlier. FINDINGS: Portable AP semi upright view at 0505 hours. Stable cardiomegaly and mediastinal contours. Previous CABG. Continued increased bilateral pulmonary interstitial markings or vascular congestion with patchy bibasilar opacity which has mildly improved since 02/19/2017. No pneumothorax. No definite pleural effusion. IMPRESSION: 1. Continued increased bilateral pulmonary interstitial opacity. Patchy and confluent bibasilar opacity has mildly improved since 02/19/2017. 2. Differential considerations include pulmonary edema with atelectasis and viral/atypical respiratory infection. Electronically  Signed   By: Genevie Ann M.D.   On: 02/23/2017 07:51    Assessment/Plan: 5 Days Post-Op Procedure(s) (LRB): XI ROBOTIC ASSITED PARTIAL NEPHRECTOMY (Right) XI ROBOTIC ASSISTED LAPAROSCOPIC CHOLECYSTECTOMY (N/A)    CXR, WBC, and renal function all improving.  He's still requiring O2 for desat  Continue ambulation, aggressive pulm toilet  Consider home O2 if unable to wean soon   Repeat labs in am   LOS: 5 days    [facility], AMANDA 02/24/2017, 12:43 PM

## 2017-02-24 NOTE — Progress Notes (Signed)
Physical Therapy Treatment Patient Details Name: [name] MRN: [id number] DOB: 05-06-1932 Today's Date: 02/24/2017    History of Present Illness 83 year old admitted w/ significant h/o right renal mass & chronic calculus cholecystitis and also CAD (w/ prior CABG).  Now s/p elective robotic assisted partial right Nephrectomy and robotic assisted laparoscopic cholecystectomy on 02/19/2017 . Pt found to be hyxpoxemic and had acute post op hypoxemic resp failure    PT Comments    Pt ambulated in hallway and requires supplemental oxygen.  Pt on 2L O2 at rest upon entering room and required 3L with activity.  Pt's O2 needs and saturations were discussed with pt.  Pt educated to take frequent rest breaks upon d/c.  Pt states he will likely d/c home tomorrow.   Follow Up Recommendations  No PT follow up     Equipment Recommendations  None recommended by PT    Recommendations for Other Services       Precautions / Restrictions Precautions Precautions: Fall Precaution Comments: monitor sats    Mobility  Bed Mobility               General bed mobility comments: pt up in recliner on arrival  Transfers Overall transfer level: Needs assistance Equipment used: Rolling walker (2 wheeled) Transfers: Sit to/from Stand Sit to Stand: Min guard         General transfer comment: verbal cues for hand placement  Ambulation/Gait Ambulation/Gait assistance: Min guard Ambulation Distance (Feet): 250 Feet Assistive device: Rolling walker (2 wheeled) Gait Pattern/deviations: Step-through pattern;Decreased stride length     General Gait Details: verbal cues for safe use of RW, pt steady with RW, monitored Spo2 and pt required 3L O2 (see other progress notes for saturation qualification)   Stairs            Wheelchair Mobility    Modified Rankin (Stroke Patients Only)       Balance                                    Cognition Arousal/Alertness:  Awake/alert Behavior During Therapy: WFL for tasks assessed/performed Overall Cognitive Status: Within Functional Limits for tasks assessed                      Exercises      General Comments        Pertinent Vitals/Pain Pain Assessment: No/denies pain    Home Living                      Prior Function            PT Goals (current goals can now be found in the care plan section) Progress towards PT goals: Progressing toward goals    Frequency    Min 3X/week      PT Plan Current plan remains appropriate    Co-evaluation             End of Session Equipment Utilized During Treatment: Oxygen Activity Tolerance: Patient tolerated treatment well Patient left: with call bell/phone within reach;in chair Nurse Communication: Mobility status PT Visit Diagnosis: Difficulty in walking, not elsewhere classified (R26.2)     Time: 6644-0347 PT Time Calculation (min) (ACUTE ONLY): 15 min  Charges:  $Gait Training: 8-22 mins  G CodesTrena Platt 02/24/2017, 2:21 PM Carmelia Bake, PT, DPT 02/24/2017 Pager: [phone]

## 2017-02-24 NOTE — Progress Notes (Signed)
SATURATION QUALIFICATIONS: (This note is used to comply with regulatory documentation for home oxygen)  Patient Saturations on Room Air at Rest = 86%  Patient Saturations on Room Air while Ambulating = n/a (on 2L O2 at rest, dropped to 86% on 2L O2 during ambulation)  Patient Saturations on 3 Liters of oxygen while Ambulating = 88-93%  Please briefly explain why patient needs home oxygen: to improve oxygen saturations at rest and during physical activity (such as ambulation).  Carmelia Bake, PT, DPT 02/24/2017 Pager: [phone]

## 2017-02-24 NOTE — Care Management Note (Signed)
Case Management Note  Patient Details  Name: [name] MRN: [id number] Date of Birth: 1932-09-08  Subjective/Objective:  83 y/o m admitted w/renal mass,s/p nephrectomy-p/o ileus. From home. On 02-if home 02 needed can arrange w/documented 02 sats, & orders.                   Action/Plan:d/c plan home.   Expected Discharge Date:                  Expected Discharge Plan:  Home/Self Care  In-House Referral:     Discharge planning Services  CM Consult  Post Acute Care Choice:    Choice offered to:     DME Arranged:    DME Agency:     HH Arranged:    HH Agency:     Status of Service:  In process, will continue to follow  If discussed at Long Length of Stay Meetings, dates discussed:    Additional Comments:  Dessa Phi, RN 02/24/2017, 1:02 PM

## 2017-02-25 DIAGNOSIS — I509 Heart failure, unspecified: Secondary | ICD-10-CM

## 2017-02-25 DIAGNOSIS — I5032 Chronic diastolic (congestive) heart failure: Secondary | ICD-10-CM

## 2017-02-25 LAB — BASIC METABOLIC PANEL
ANION GAP: 9 (ref 5–15)
BUN: 39 mg/dL — ABNORMAL HIGH (ref 6–20)
CHLORIDE: 107 mmol/L (ref 101–111)
CO2: 28 mmol/L (ref 22–32)
Calcium: 8.4 mg/dL — ABNORMAL LOW (ref 8.9–10.3)
Creatinine, Ser: 2.36 mg/dL — ABNORMAL HIGH (ref 0.61–1.24)
GFR calc Af Amer: 28 mL/min — ABNORMAL LOW (ref >60)
GFR calc non Af Amer: 24 mL/min — ABNORMAL LOW (ref >60)
GLUCOSE: 107 mg/dL — AB (ref 65–99)
POTASSIUM: 3.8 mmol/L (ref 3.5–5.1)
Sodium: 144 mmol/L (ref 135–145)

## 2017-02-25 LAB — POCT I-STAT 7, (LYTES, BLD GAS, ICA,H+H)
Acid-base deficit: 6 mmol/L — ABNORMAL HIGH (ref 0.0–2.0)
Acid-base deficit: 6 mmol/L — ABNORMAL HIGH (ref 0.0–2.0)
BICARBONATE: 22.7 mmol/L (ref 20.0–28.0)
Bicarbonate: 20.8 mmol/L (ref 20.0–28.0)
CALCIUM ION: 1.23 mmol/L (ref 1.15–1.40)
CALCIUM ION: 1.24 mmol/L (ref 1.15–1.40)
HCT: 40 % (ref 39.0–52.0)
HEMATOCRIT: 42 % (ref 39.0–52.0)
HEMOGLOBIN: 13.6 g/dL (ref 13.0–17.0)
HEMOGLOBIN: 14.3 g/dL (ref 13.0–17.0)
O2 Saturation: 81 %
O2 Saturation: 99 %
PCO2 ART: 45 mmHg (ref 32.0–48.0)
PCO2 ART: 58.6 mmHg — AB (ref 32.0–48.0)
PH ART: 7.197 — AB (ref 7.350–7.450)
PO2 ART: 176 mmHg — AB (ref 83.0–108.0)
POTASSIUM: 4.1 mmol/L (ref 3.5–5.1)
Potassium: 4 mmol/L (ref 3.5–5.1)
SODIUM: 144 mmol/L (ref 135–145)
SODIUM: 145 mmol/L (ref 135–145)
TCO2: 22 mmol/L (ref 0–100)
TCO2: 25 mmol/L (ref 0–100)
pH, Arterial: 7.273 — ABNORMAL LOW (ref 7.350–7.450)
pO2, Arterial: 52 mmHg — ABNORMAL LOW (ref 83.0–108.0)

## 2017-02-25 LAB — GLUCOSE, CAPILLARY
Glucose-Capillary: 114 mg/dL — ABNORMAL HIGH (ref 65–99)
Glucose-Capillary: 144 mg/dL — ABNORMAL HIGH (ref 65–99)

## 2017-02-25 NOTE — Progress Notes (Signed)
6 Days Post-Op Subjective: No complaints this AM Unable to wean oxygen when ambulating  Objective: Vital signs in last 24 hours: Temp:  [98 F (36.7 C)-98.7 F (37.1 C)] 98 F (36.7 C) (03/22 1245) Pulse Rate:  [50-67] 54 (03/22 1245) Resp:  [18-19] 18 (03/22 1245) BP: (131-145)/(62-70) 145/69 (03/22 1245) SpO2:  [95 %-99 %] 99 % (03/22 1245) Weight:  [89.8 kg (197 lb 15.6 oz)] 89.8 kg (197 lb 15.6 oz) (03/22 0605)  Intake/Output from previous day: 03/21 0701 - 03/22 0700 In: 600 [P.O.:600] Out: 2150 [Urine:2150] Intake/Output this shift: Total I/O In: -  Out: 525 [Urine:525]  Physical Exam:  General:alert, cooperative and no distress  CV: RRR Lungs: crackles bilateral bases otherwise CTA; non labored breathing on 2L O2 via [place] GI: soft, non tender, normal bowel sounds, no palpable masses   Lab Results:  Recent Labs  02/24/17 0540  HGB 10.8*  HCT 32.0*   BMET  Recent Labs  02/24/17 0540 02/25/17 0540  NA 138 144  K 3.8 3.8  CL 105 107  CO2 25 28  GLUCOSE 170* 107*  BUN 48* 39*  CREATININE 2.87* 2.36*  CALCIUM 8.0* 8.4*   Lab Results  Component Value Date   WBC 6.9 02/24/2017   HGB 10.8 (L) 02/24/2017   HCT 32.0 (L) 02/24/2017   MCV 80.8 02/24/2017   PLT 178 02/24/2017   Results for orders placed or performed during the hospital encounter of 05/12/13  Urine culture     Status: None   Collection Time: 05/12/13  3:43 PM  Result Value Ref Range Status   Specimen Description URINE, CLEAN CATCH  Final   Special Requests NONE  Final   Culture  Setup Time 05/12/2013 16:15  Final   Colony Count 3,000 COLONIES/ML  Final   Culture INSIGNIFICANT GROWTH  Final   Report Status 05/13/2013 FINAL  Final  MRSA PCR Screening     Status: None   Collection Time: 05/12/13  7:47 PM  Result Value Ref Range Status   MRSA by PCR NEGATIVE NEGATIVE Final    Comment:        The GeneXpert MRSA Assay (FDA approved for NASAL specimens only), is one component of  a comprehensive MRSA colonization surveillance program. It is not intended to diagnose MRSA infection nor to guide or monitor treatment for MRSA infections.    Studies/Results: Dg Chest Port 1 View  Result Date: 02/24/2017 CLINICAL DATA:  Short of breath EXAM: PORTABLE CHEST 1 VIEW COMPARISON:  Yesterday FINDINGS: The heart is moderately enlarged. Diffuse airspace disease is improved no pneumothorax. Vascular congestion. Postoperative changes from CABG. IMPRESSION: Improved pulmonary edema. Electronically Signed   By: Marybelle Killings M.D.   On: 02/24/2017 08:05    Assessment/Plan: 6 Days Post-Op Procedure(s) (LRB): XI ROBOTIC ASSITED PARTIAL NEPHRECTOMY (Right) XI ROBOTIC ASSISTED LAPAROSCOPIC CHOLECYSTECTOMY (N/A)    Discharge home on 2 liters oxygen.  f/u with me as scheduled.  Will need f/u with PCP as well.   LOS: 6 days   [name] 02/25/2017, 1:42 PM

## 2017-02-25 NOTE — Care Management Note (Signed)
Case Management Note  Patient Details  Name: QUANTAVIOUS EGGERT MRN: [id number] Date of Birth: 04/16/1932  Subjective/Objective: Qualifies for home 02. Awaiting home 02 order w/liter flow,duration,frequency.[place] dme rep Maudie Mercury awaiting order prior to delivery to rm.                    Action/Plan:d/c home w/home 02.   Expected Discharge Date:                  Expected Discharge Plan:  Home/Self Care  In-House Referral:     Discharge planning Services  CM Consult  Post Acute Care Choice:    Choice offered to:     DME Arranged:    DME Agency:     HH Arranged:    HH Agency:     Status of Service:  In process, will continue to follow  If discussed at Long Length of Stay Meetings, dates discussed:    Additional Comments:  Dessa Phi, RN 02/25/2017, 11:08 AM

## 2017-02-25 NOTE — Care Management Note (Signed)
Case Management Note  Patient Details  Name: [name] MRN: [id number] Date of Birth: 1932/02/19  Subjective/Objective: Will need qualifying dx for patient's insurance to pay for home 02, approximately $300/month concentrator/portables.Home 02 can still be provided @ as out of pocket without a qualifying dx. [place] dme rep Kim awaiting home 02 order.                   Action/Plan:d/c plan home w/home 02.   Expected Discharge Date:                  Expected Discharge Plan:  Home/Self Care  In-House Referral:     Discharge planning Services  CM Consult  Post Acute Care Choice:    Choice offered to:     DME Arranged:    DME Agency:     HH Arranged:    HH Agency:     Status of Service:  In process, will continue to follow  If discussed at Long Length of Stay Meetings, dates discussed:    Additional Comments:  Dessa Phi, RN 02/25/2017, 12:00 PM

## 2017-02-25 NOTE — Progress Notes (Addendum)
SATURATION QUALIFICATIONS: (This note is used to comply with regulatory documentation for home oxygen)  Patient Saturations on Room Air at Rest = 92%  Patient Saturations on Room Air while Ambulating = 84%  Patient Saturations on 3 Liters of oxygen while Ambulating = 90%  Patient Saturations on 4 Liters of oxygen while Ambulating = 95%  Please briefly explain why patient needs home oxygen:

## 2017-02-25 NOTE — Progress Notes (Signed)
Came to round on patient this AM, he was in the bathroom, having a BM.  I will check back with him before noon.   His chart check would suggest he is ready for d/c aside from his O2 requirement.   I would like to discharge him home today, hoping his O2 requirement has improved.  However, if he needs O2 at home then hopefully this can be easily arranged.  I don't expect that he'll need this long term, and hope there is guidance as to how/when this can be stopped (? f/u).

## 2017-02-25 NOTE — Discharge Summary (Signed)
Date of admission: 02/19/2017  Date of discharge: 02/25/2017  Admission diagnosis: renal cell carcinoma, chronic cholecystitis  Discharge diagnosis: same, congestive heart failure  Secondary diagnoses:  Patient Active Problem List   Diagnosis Date Noted  . CHF (congestive heart failure) ([place]) 02/25/2017  . H/O partial nephrectomy for renal mass 02/19/2017  . Acute postoperative respiratory failure ([place]) 02/19/2017  . Chronic cholecystitis with calculus s/p robotic cholecystectomy 02/19/2017 02/19/2017  . Acute kidney injury superimposed on chronic kidney disease ([place]) 11/18/2016  . Nausea vomiting and diarrhea 11/18/2016  . Renal mass s/p right partial nephrectomy 02/19/2017 11/18/2016  . Gastroenteritis 11/18/2016  . Diabetes ([place]) 11/18/2016  . Elevated transaminase level 11/18/2016  . Dyslipidemia 05/12/2013  . Syncope 01/14/2012  . Hypoglycemia 01/14/2012  . CKD (chronic kidney disease) stage 3 w/ baseline cr ~1.49 01/14/2012  . CAD (coronary artery disease), HTN and HLD 01/14/2012  . HTN (hypertension) 01/14/2012    History and Physical: For full details, please see admission history and physical. Briefly, [name] is a 83 y.o. year old patient with acute cholecystitis and a right renal mass.   Hospital Course: The patient's procedure was routine, but he had a difficult time with oxygenation upon wake-up from anesthesia.  He was monitored in the ICU for one night and then transferred to the floor on POD #2.  His renal function was noted to be acutely worse initially, but was trending closer to his baseline at the time of his discharge.  His oxygenation status was low 90s% on 2L if oxygen ([place]).  He was eating without issue.  His bowels were functioning normally and he was voiding on his own.  He was ambulatory and had excellent pain control.  He was discharged home with oxygen (2L [place]).  He is scheduled for follow-up with both Dr. Louis Meckel and Dr. Johney Maine.  Laboratory values:    Recent Labs  02/24/17 0540  WBC 6.9  HGB 10.8*  HCT 32.0*    Recent Labs  02/23/17 0503 02/24/17 0540 02/25/17 0540  NA 139 138 144  K 3.9 3.8 3.8  CL 107 105 107  CO2 25 25 28   GLUCOSE 183* 170* 107*  BUN 62* 48* 39*  CREATININE 3.41* 2.87* 2.36*  CALCIUM 7.9* 8.0* 8.4*   No results for input(s): LABPT, INR in the last 72 hours. No results for input(s): LABURIN in the last 72 hours. Results for orders placed or performed during the hospital encounter of 05/12/13  Urine culture     Status: None   Collection Time: 05/12/13  3:43 PM  Result Value Ref Range Status   Specimen Description URINE, CLEAN CATCH  Final   Special Requests NONE  Final   Culture  Setup Time 05/12/2013 16:15  Final   Colony Count 3,000 COLONIES/ML  Final   Culture INSIGNIFICANT GROWTH  Final   Report Status 05/13/2013 FINAL  Final  MRSA PCR Screening     Status: None   Collection Time: 05/12/13  7:47 PM  Result Value Ref Range Status   MRSA by PCR NEGATIVE NEGATIVE Final    Comment:        The GeneXpert MRSA Assay (FDA approved for NASAL specimens only), is one component of a comprehensive MRSA colonization surveillance program. It is not intended to diagnose MRSA infection nor to guide or monitor treatment for MRSA infections.    Disposition: Home  Discharge instruction: The patient was instructed to be ambulatory but told to refrain from heavy lifting, strenuous activity,  or driving.   Discharge medications: Allergies as of 02/25/2017   No Known Allergies     Medication List    STOP taking these medications   oxyCODONE 5 MG immediate release tablet Commonly known as:  ROXICODONE     TAKE these medications   acetaminophen 325 MG tablet Commonly known as:  TYLENOL Take 650 mg by mouth every 6 (six) hours as needed (for pain.).   amLODipine 5 MG tablet Commonly known as:  NORVASC Take 5 mg by mouth daily.   aspirin 325 MG EC tablet Take 325 mg by mouth daily.    benazepril 40 MG tablet Commonly known as:  LOTENSIN Take 40 mg by mouth daily.   cloNIDine 0.1 MG tablet Commonly known as:  CATAPRES Take 0.1 mg by mouth 2 (two) times daily.   ezetimibe 10 MG tablet Commonly known as:  ZETIA Take 10 mg by mouth daily.   furosemide 40 MG tablet Commonly known as:  LASIX Take 40 mg by mouth daily.   HUMALOG KWIKPEN 100 UNIT/ML KiwkPen Generic drug:  insulin lispro Inject 10 Units into the skin daily with supper.   LEVEMIR 100 UNIT/ML injection Generic drug:  insulin detemir Inject 30 Units into the skin 2 (two) times daily.   nadolol 40 MG tablet Commonly known as:  CORGARD Take 40 mg by mouth daily.   rosuvastatin 20 MG tablet Commonly known as:  CRESTOR Take 20 mg by mouth daily.   SYNTHROID 100 MCG tablet Generic drug:  levothyroxine Take 100 mcg by mouth daily.   tetrahydrozoline 0.05 % ophthalmic solution Place 1-2 drops into both eyes 3 (three) times daily as needed (for dry eyes).   traMADol 50 MG tablet Commonly known as:  ULTRAM Take 1-2 tablets (50-100 mg total) by mouth every 6 (six) hours as needed for moderate pain or severe pain.   TRILIPIX 45 MG capsule Generic drug:  Choline Fenofibrate Take 45 mg by mouth daily.            Durable Medical Equipment        Start     Ordered   02/25/17 1326  For home use only DME oxygen  Once    Question Answer Comment  Mode or (Route) Nasal cannula   Liters per Minute 2   Frequency Continuous (stationary and portable oxygen unit needed)   Oxygen conserving device Yes   Oxygen delivery system Gas      02/25/17 1328      Followup:  Follow-up Information    GROSS,STEVEN C., MD. Schedule an appointment as soon as possible for a visit in 3 weeks.   Specialty:  General Surgery Why:  To follow up after your operation Contact information: [place] [id number] [phone]        [name], MD Follow up on 03/04/2017.    Specialty:  Urology Why:  at 1:15 Contact information: [place] [id number] [phone]

## 2017-02-25 NOTE — Care Management Note (Signed)
Case Management Note  Patient Details  Name: [name] MRN: [id number] Date of Birth: Oct 02, 1932  Subjective/Objective:  Noted per attending patient has qualifying FQ:[id number] chf w/an acute attack of chf this hospitalization. Home 02 ordered.[place] dme rep Maudie Mercury will provide home 02 tank to rm prior d/c. [place] DME will also make home visit to further explain home 02 along w/portable tanks-therefore No HHC respiratory care needed-MD aware & d/c HH respiratory care order. No further CM needs.                 Action/Plan:d/c home w/home 02.   Expected Discharge Date:                Expected Discharge Plan:  Home/Self Care  In-House Referral:     Discharge planning Services  CM Consult  Post Acute Care Choice:    Choice offered to:     DME Arranged:  Oxygen DME Agency:  [place]:    [place] Agency:     Status of Service:  Completed, signed off  If discussed at [place] of Stay Meetings, dates discussed:    Additional Comments:  Dessa Phi, RN 02/25/2017, 1:51 PM

## 2017-03-09 ENCOUNTER — Ambulatory Visit (HOSPITAL_COMMUNITY): Payer: Medicare Other | Attending: Cardiology

## 2017-03-09 ENCOUNTER — Other Ambulatory Visit: Payer: Self-pay

## 2017-03-09 DIAGNOSIS — I2510 Atherosclerotic heart disease of native coronary artery without angina pectoris: Secondary | ICD-10-CM | POA: Insufficient documentation

## 2017-03-09 DIAGNOSIS — I340 Nonrheumatic mitral (valve) insufficiency: Secondary | ICD-10-CM | POA: Insufficient documentation

## 2017-03-09 DIAGNOSIS — I361 Nonrheumatic tricuspid (valve) insufficiency: Secondary | ICD-10-CM | POA: Insufficient documentation

## 2017-03-09 DIAGNOSIS — I119 Hypertensive heart disease without heart failure: Secondary | ICD-10-CM | POA: Diagnosis not present

## 2017-03-09 DIAGNOSIS — I313 Pericardial effusion (noninflammatory): Secondary | ICD-10-CM | POA: Insufficient documentation

## 2017-03-09 DIAGNOSIS — E119 Type 2 diabetes mellitus without complications: Secondary | ICD-10-CM | POA: Diagnosis not present

## 2017-03-09 DIAGNOSIS — C641 Malignant neoplasm of right kidney, except renal pelvis: Secondary | ICD-10-CM | POA: Diagnosis not present

## 2017-03-09 DIAGNOSIS — E785 Hyperlipidemia, unspecified: Secondary | ICD-10-CM | POA: Diagnosis not present

## 2017-03-09 DIAGNOSIS — Z951 Presence of aortocoronary bypass graft: Secondary | ICD-10-CM | POA: Diagnosis not present

## 2017-03-09 DIAGNOSIS — R931 Abnormal findings on diagnostic imaging of heart and coronary circulation: Secondary | ICD-10-CM

## 2017-03-10 ENCOUNTER — Telehealth: Payer: Self-pay | Admitting: Internal Medicine

## 2017-03-10 NOTE — Telephone Encounter (Signed)
Closed encounter °

## 2017-03-11 ENCOUNTER — Telehealth: Payer: Self-pay | Admitting: Internal Medicine

## 2017-03-11 NOTE — Telephone Encounter (Signed)
Received records from [facility] for appointment on 03/22/17 with Dr Debara Pickett.  Records put with Dr Lysbeth Penner schedule for 03/22/17. lp

## 2017-03-22 ENCOUNTER — Ambulatory Visit (INDEPENDENT_AMBULATORY_CARE_PROVIDER_SITE_OTHER): Payer: Medicare Other | Admitting: Internal Medicine

## 2017-03-22 ENCOUNTER — Encounter: Payer: Self-pay | Admitting: Internal Medicine

## 2017-03-22 VITALS — BP 156/82 | HR 52 | Ht 69.0 in | Wt 204.0 lb

## 2017-03-22 DIAGNOSIS — N183 Chronic kidney disease, stage 3 unspecified: Secondary | ICD-10-CM

## 2017-03-22 DIAGNOSIS — I3139 Other pericardial effusion (noninflammatory): Secondary | ICD-10-CM

## 2017-03-22 DIAGNOSIS — I272 Pulmonary hypertension, unspecified: Secondary | ICD-10-CM

## 2017-03-22 DIAGNOSIS — Z79899 Other long term (current) drug therapy: Secondary | ICD-10-CM

## 2017-03-22 DIAGNOSIS — Z01818 Encounter for other preprocedural examination: Secondary | ICD-10-CM

## 2017-03-22 DIAGNOSIS — I313 Pericardial effusion (noninflammatory): Secondary | ICD-10-CM | POA: Diagnosis not present

## 2017-03-22 DIAGNOSIS — R0602 Shortness of breath: Secondary | ICD-10-CM | POA: Diagnosis not present

## 2017-03-22 MED ORDER — FUROSEMIDE 40 MG PO TABS: 40.0000 mg | ORAL_TABLET | Freq: Two times a day (BID) | ORAL | 5 refills | Status: DC

## 2017-03-22 NOTE — Progress Notes (Signed)
OFFICE NOTE  Chief Complaint:  Hospital follow-up  Primary Care Physician: Dwan Bolt, MD  HPI:  [name] is a pleasant gentleman who I previously saw in 2014. He has a history of coronary artery disease status post CABG in 1996. He also has chronic kidney disease with a creatinine that was around 1.8. In the past he seen Dr. Moshe Cipro, however she no longer sees him. He denies any chest pain or worsening shortness of breath. He reports good blood pressure control on his current medicines. He is somewhat active but does not exercise regularly. His primary care provider has been supplying his medications however we have prescribed him in the past for nadolol. He was noted in the past to have a right bundle branch block was which was new. His last stress test was in 2012 which was negative for ischemia.  03/22/2017  [name] returns today for hospital follow-up. I have not seen him since 2016. He has a history of coronary artery disease status post CABG in 1996 as well as chronic kidney disease is had a creatinine is elevated is 1.8 in the past but apparently that improved. He was previously seen Dr. Moshe Cipro. He does have a history of chronic cholecystitis and is part of her workup for that was found to have renal cell cancer. In March she was admitted for surgery and underwent robotic-assisted right partial nephrectomy for a renal mass and combined cholecystectomy. He had difficulty in oxygenation after surgery and was monitored in the ICU for hypoxia. It was felt that there was an element of diastolic congestive heart failure. An echocardiogram was performed on 02/20/2017 which showed an EF of 30-16%, grade 2 diastolic dysfunction and moderate pulmonary hypertension with an RVSP of 61 mmHg. He underwent a subsequent limited echo on 03/09/2017 which showed an EF of 01-09%, grade 2 diastolic function, however there was an increase in RVSP to 85 mmHg suggestive of severe pulmonary  hypertension and there was a moderate localized pericardial effusion around the inferior and inferolateral walls without signs of tamponade physiology. [name] is had persistent shortness of breath and hypoxia. He was sent home on oxygen and recently noted in his urologist office to be hypoxic with an O2 sat duration of 87% on room air.  PMHx:  Past Medical History:  Diagnosis Date  . Chronic kidney disease    tumor right kidney  . Coronary artery disease   . Diabetes mellitus    type 2  . Hypertension   . Myocardial infarction ([place])    1997  . Right renal mass     Past Surgical History:  Procedure Laterality Date  . CARDIAC CATHETERIZATION  1996  . CORONARY ARTERY BYPASS GRAFT    . DOPPLER ECHOCARDIOGRAPHY  2011  . HERNIA REPAIR    . IR GENERIC HISTORICAL  12/09/2016   IR RADIOLOGIST EVAL & MGMT 12/09/2016 [name], MD [facility] INTERV RAD  . ROBOTIC ASSITED PARTIAL NEPHRECTOMY Right 02/19/2017   Procedure: XI ROBOTIC ASSITED PARTIAL NEPHRECTOMY;  Surgeon: [name], MD;  Location: [facility];  Service: Urology;  Laterality: Right;    FAMHx:  Family History  Problem Relation Age of Onset  . Diabetes Mother   . Diabetes Sister     SOCHx:   reports that he quit smoking about 20 years ago. He has never used smokeless tobacco. He reports that he does not drink alcohol or use drugs.  ALLERGIES:  No Known Allergies  ROS: Pertinent items  noted in HPI and remainder of comprehensive ROS otherwise negative.  HOME MEDS: Current Outpatient Prescriptions  Medication Sig Dispense Refill  . acetaminophen (TYLENOL) 325 MG tablet Take 650 mg by mouth every 6 (six) hours as needed (for pain.).    Marland Kitchen amLODipine (NORVASC) 5 MG tablet Take 5 mg by mouth daily.   0  . aspirin 325 MG EC tablet Take 325 mg by mouth daily.    . benazepril (LOTENSIN) 40 MG tablet Take 40 mg by mouth daily.  0  . Choline Fenofibrate (TRILIPIX) 45 MG capsule Take 45 mg by mouth daily.    . cloNIDine  (CATAPRES) 0.1 MG tablet Take 0.1 mg by mouth 2 (two) times daily.    Marland Kitchen ezetimibe (ZETIA) 10 MG tablet Take 10 mg by mouth daily.    . furosemide (LASIX) 40 MG tablet Take 1 tablet (40 mg total) by mouth 2 (two) times daily. 60 tablet 5  . HUMALOG KWIKPEN 100 UNIT/ML KiwkPen Inject 10 Units into the skin daily with supper.   0  . LEVEMIR 100 UNIT/ML injection Inject 30 Units into the skin 2 (two) times daily.   11  . nadolol (CORGARD) 40 MG tablet Take 40 mg by mouth daily.    . rosuvastatin (CRESTOR) 20 MG tablet Take 20 mg by mouth daily.    Marland Kitchen SYNTHROID 100 MCG tablet Take 100 mcg by mouth daily.   0  . tetrahydrozoline 0.05 % ophthalmic solution Place 1-2 drops into both eyes 3 (three) times daily as needed (for dry eyes).    . traMADol (ULTRAM) 50 MG tablet Take 1-2 tablets (50-100 mg total) by mouth every 6 (six) hours as needed for moderate pain or severe pain. 30 tablet 0   No current facility-administered medications for this visit.     LABS/IMAGING: No results found for this or any previous visit (from the past 48 hour(s)). No results found.  WEIGHTS: Wt Readings from Last 3 Encounters:  03/22/17 204 lb (92.5 kg)  02/25/17 197 lb 15.6 oz (89.8 kg)  02/15/17 198 lb (89.8 kg)    VITALS: BP (!) 156/82 (BP Location: Right Arm, Patient Position: Sitting, Cuff Size: Normal)   Pulse (!) 52   Ht 5\' 9"  (1.753 m)   Wt 204 lb (92.5 kg)   BMI 30.13 kg/m   EXAM: General appearance: alert and no distress Neck: no carotid bruit, no JVD and thyroid not enlarged, symmetric, no tenderness/mass/nodules Lungs: clear to auscultation bilaterally Heart: regular rate and rhythm, S1, S2 normal, no murmur, click, rub or gallop Abdomen: soft, non-tender; bowel sounds normal; no masses,  no organomegaly Extremities: extremities normal, atraumatic, no cyanosis or edema Pulses: 2+ and symmetric Skin: Skin color, texture, turgor normal. No rashes or lesions Neurologic: Grossly normal Psych:  Pleasant  EKG: Deferred  ASSESSMENT:  1. Acute hypoxic respiratory failure 2. Moderate sized pericardial effusion 3. CAD status post CABG 1996 4. CKD with creatinine around 2.36 (as high as 3.84 recently) 5. Hypertension 6. Dyslipidemia 7. RBBB  PLAN: 1.    [name] has had an eventful last year particularly with chronic pain related to cholecystitis and having found to have a renal cell cancer. These were successfully treated although his surgery was complicated by acute hypoxic respiratory failure. Echo showed normal systolic function with grade 2 diastolic dysfunction and he was sent home on low-dose Lasix as well as oxygen. He continues to be hypoxic with shortness of breath on exertion. A repeat echo now shows severe  pulmonary hypertension and a moderate sized focal pericardial effusion. No evidence for tap and on physiology. Creatinine does appear to a been improving down to 2.36, approximately 3 weeks ago. Fortunately he was spared total nephrectomy. On exam today he appears volume overloaded and would benefit from additional diuresis. I'm concerned about a severe pulmonary hypertension with an unclear etiology. Given recent malignancy, one must consider thromboembolic disease is a possibility. I'm recommending a VQ scan to evaluate for pulmonary emboli and will refer him to pulmonary for further evaluation. We will need to repeat a limited echo in 2 weeks to see if his pericardial effusion is changed in size. Plan follow-up with me afterwards.  Pixie Casino, MD, [facility] Attending Cardiologist Philo C Wisdom Seybold 03/22/2017, 1:14 PM

## 2017-03-22 NOTE — Patient Instructions (Addendum)
Your physician has recommended you make the following change in your medication:  -- INCREASE furosemide (lasix) to 40mg  twice daily  Your physician recommends that you return for lab work NEXT WEEK (BNP, BMET)  You have been referred to Dr. Simonne Maffucci @ [facility] ([facility])  Your physician has requested that you have a limited echocardiogram in [place] @ [address]. Echocardiography is a painless test that uses sound waves to create images of your heart. It provides your doctor with information about the size and shape of your heart and how well your heart's chambers and valves are working. This procedure takes approximately one hour. There are no restrictions for this procedure.  Dr. Debara Pickett has ordered a VQ scan. This is done at [facility].   Your physician recommends that you schedule a follow-up appointment after your echo with Dr. Debara Pickett.  Ventilation-Perfusion Scan A ventilation-perfusion scan is a scan to look at the airflow (ventilation) and blood flow (perfusion) in your lungs. It is most often used to look for blood clots that may have traveled to your lungs. During this scan, radioactive compounds are injected into your body or are breathed in (inhale). These radioactive compounds are detected by a special camera during the scan, are given at very low doses, are not harmful to you, and last in your body for a very short time. Tell a health care provider about:  Any allergies you have.  All medicines you are taking, including vitamins, herbs, eye drops, creams, and over-the-counter medicines.  Any blood disorders you have.  Any surgeries you have had.  Any medical conditions you have.  Possibility of pregnancy, if this applies.  Breastfeeding, if this applies. What are the risks? Generally, this is a safe procedure. However, as with any procedure, complications can occur. A possible complication includes having an allergic reaction to the  radioactive compounds. What happens before the procedure?  Do not smoke before your test.  Take medicine as directed by your health care provider. What happens during the procedure?  A small needle will be placed in a vein in your arm or hand. This needle will stay in place for the entire exam.  A small amount of very short-acting radioactive material will be injected.  Your lungs will then be scanned using a special camera. This camera will record the images.  You will be asked to inhale a second radioactive compound. After this, the lungs are scanned again. What happens after the procedure?  You may go home unless your health care provider instructs you differently.  You may continue with normal activities and diet as instructed by your health care provider. This information is not intended to replace advice given to you by your health care provider. Make sure you discuss any questions you have with your health care provider. Document Released: 11/20/2000 Document Revised: 04/30/2016 Document Reviewed: 06/08/2013 Elsevier Interactive Patient Education  2017 [place].  Echocardiogram An echocardiogram, or echocardiography, uses sound waves (ultrasound) to produce an image of your heart. The echocardiogram is simple, painless, obtained within a short period of time, and offers valuable information to your health care provider. The images from an echocardiogram can provide information such as:  Evidence of coronary artery disease (CAD).  Heart size.  Heart muscle function.  Heart valve function.  Aneurysm detection.  Evidence of a past heart attack.  Fluid buildup around the heart.  Heart muscle thickening.  Assess heart valve function. Tell a health  care provider about:  Any allergies you have.  All medicines you are taking, including vitamins, herbs, eye drops, creams, and over-the-counter medicines.  Any problems you or family members have had with anesthetic  medicines.  Any blood disorders you have.  Any surgeries you have had.  Any medical conditions you have.  Whether you are pregnant or may be pregnant. What happens before the procedure? No special preparation is needed. Eat and drink normally. What happens during the procedure?  In order to produce an image of your heart, gel will be applied to your chest and a wand-like tool (transducer) will be moved over your chest. The gel will help transmit the sound waves from the transducer. The sound waves will harmlessly bounce off your heart to allow the heart images to be captured in real-time motion. These images will then be recorded.  You may need an IV to receive a medicine that improves the quality of the pictures. What happens after the procedure? You may return to your normal schedule including diet, activities, and medicines, unless your health care provider tells you otherwise. This information is not intended to replace advice given to you by your health care provider. Make sure you discuss any questions you have with your health care provider. Document Released: 11/20/2000 Document Revised: 07/11/2016 Document Reviewed: 07/31/2013 Elsevier Interactive Patient Education  2017 [employer].

## 2017-03-23 ENCOUNTER — Encounter: Payer: Self-pay | Admitting: Pulmonary Disease

## 2017-03-23 ENCOUNTER — Ambulatory Visit (INDEPENDENT_AMBULATORY_CARE_PROVIDER_SITE_OTHER): Payer: Medicare Other | Admitting: Pulmonary Disease

## 2017-03-23 VITALS — BP 142/66 | HR 50 | Ht 69.0 in | Wt 199.0 lb

## 2017-03-23 DIAGNOSIS — I5032 Chronic diastolic (congestive) heart failure: Secondary | ICD-10-CM | POA: Diagnosis not present

## 2017-03-23 DIAGNOSIS — I272 Pulmonary hypertension, unspecified: Secondary | ICD-10-CM

## 2017-03-23 DIAGNOSIS — J9611 Chronic respiratory failure with hypoxia: Secondary | ICD-10-CM | POA: Diagnosis not present

## 2017-03-23 DIAGNOSIS — R0600 Dyspnea, unspecified: Secondary | ICD-10-CM

## 2017-03-23 NOTE — Assessment & Plan Note (Addendum)
His echocardiogram in March and April of this year showed elevated PA pressures. We know that patients who have left ventricular hypertrophy and left atrial enlargement have a 94% likelihood of having World Health Organization group 2 disease (elevated pulmonary capillary wedge pressure) on right heart catheterization.  So while I know he has Lexicographer group 2 disease the question is whether or not there is any degree of lung disease contributing. Given the fact that his PA pressures are so high I think it's worth evaluating for lung disease particularly considering his prior smoking history.  He also has hypothyroidism which can contribute to pulmonary hypertension.  He may have obstructive sleep apnea based on his snoring.  Plan: F/U V/Q scan ordered by cardiology.  Lung function test Overnight oximetry test Continue diuresis Continue using oxygen.

## 2017-03-23 NOTE — Patient Instructions (Signed)
Continue using 2 L of oxygen continuously Keep taking your medicines as directed by Dr. Debara Pickett We will arrange for a lung function test We will get a chest x-ray on the next visit We will arrange for an overnight oxygen test We will see you back in 4 weeks to go over the results of these tests, nurse practitioner visit

## 2017-03-23 NOTE — Progress Notes (Signed)
Subjective:    Patient ID: [name], male    DOB: 06/27/1932, 83 y.o.   MRN: [id number]  Synopsis: Referred in April 2018 by Dr. Mali Hilty from cardiology for evaluation of pulmonary hypertension.  HPI Chief Complaint  Patient presents with  . Advice Only    Referred by Dr. Debara Pickett for pulm htn.     [name] was sent to me for evaluation of pulmonary hypertension.    He notes dyspnea for 2-3 months.  > came on gradually, no clear inciting event > He never had a lung problem in the past > he says that he will get dyspneic with minimal activity such as light housework > he says that if he walks on level ground e could go about 100 yards or more without stopping > he thinks that his breathing was worse pos surgery, better now > he has ben having more leg swelling, but this is now improving.    He has been on oxygen since the hospitalization (3-4 weeks). > he had never been on that  He saw Dr. Debara Pickett yesterday and was told to take diuretics twice a day and he now feels better.    He has never had a blood clot in the past. He used to smoke from age 44 until age 26.  He smoked 1/4 of cigarettes daily.  He worked for the tobacco company in town.  His childhood was normal without lung problems.    He snores at night.  He naps daily a lot because he feels sleepy in the afternoons.  No morning headaches.     Past Medical History:  Diagnosis Date  . Chronic kidney disease    tumor right kidney  . Coronary artery disease   . Diabetes mellitus    type 2  . Hypertension   . Myocardial infarction ([place])    1997  . Right renal mass      Family History  Problem Relation Age of Onset  . Diabetes Mother   . Diabetes Sister      Social History   Social History  . Marital status: Married    Spouse name: N/A  . Number of children: N/A  . Years of education: N/A   Occupational History  . Not on file.   Social History Main Topics  . Smoking status: Former Smoker   Packs/day: 0.30    Years: 45.00    Types: Cigarettes    Quit date: 02/15/1997  . Smokeless tobacco: Never Used  . Alcohol use No  . Drug use: No  . Sexual activity: Yes   Other Topics Concern  . Not on file   Social History Narrative  . No narrative on file     No Known Allergies   Outpatient Medications Prior to Visit  Medication Sig Dispense Refill  . acetaminophen (TYLENOL) 325 MG tablet Take 650 mg by mouth every 6 (six) hours as needed (for pain.).    Marland Kitchen amLODipine (NORVASC) 5 MG tablet Take 5 mg by mouth daily.   0  . aspirin 325 MG EC tablet Take 325 mg by mouth daily.    . benazepril (LOTENSIN) 40 MG tablet Take 40 mg by mouth daily.  0  . Choline Fenofibrate (TRILIPIX) 45 MG capsule Take 45 mg by mouth daily.    . cloNIDine (CATAPRES) 0.1 MG tablet Take 0.1 mg by mouth 2 (two) times daily.    Marland Kitchen ezetimibe (ZETIA) 10 MG tablet Take 10 mg by  mouth daily.    . furosemide (LASIX) 40 MG tablet Take 1 tablet (40 mg total) by mouth 2 (two) times daily. 60 tablet 5  . HUMALOG KWIKPEN 100 UNIT/ML KiwkPen Inject 10 Units into the skin daily with supper.   0  . LEVEMIR 100 UNIT/ML injection Inject 30 Units into the skin 2 (two) times daily.   11  . nadolol (CORGARD) 40 MG tablet Take 40 mg by mouth daily.    . rosuvastatin (CRESTOR) 20 MG tablet Take 20 mg by mouth daily.    Marland Kitchen SYNTHROID 100 MCG tablet Take 100 mcg by mouth daily.   0  . tetrahydrozoline 0.05 % ophthalmic solution Place 1-2 drops into both eyes 3 (three) times daily as needed (for dry eyes).    . traMADol (ULTRAM) 50 MG tablet Take 1-2 tablets (50-100 mg total) by mouth every 6 (six) hours as needed for moderate pain or severe pain. 30 tablet 0   No facility-administered medications prior to visit.       Review of Systems  Constitutional: Negative for fever and unexpected weight change.  HENT: Negative for congestion, dental problem, ear pain, nosebleeds, postnasal drip, rhinorrhea, sinus pressure, sneezing, sore  throat and trouble swallowing.   Eyes: Negative for redness and itching.  Respiratory: Positive for shortness of breath. Negative for cough, chest tightness and wheezing.   Cardiovascular: Negative for palpitations and leg swelling.  Gastrointestinal: Negative for nausea and vomiting.  Genitourinary: Negative for dysuria.  Musculoskeletal: Negative for joint swelling.  Skin: Negative for rash.  Neurological: Negative for headaches.  Hematological: Does not bruise/bleed easily.  Psychiatric/Behavioral: Negative for dysphoric mood. The patient is not nervous/anxious.        Objective:   Physical Exam  Vitals:   03/23/17 1101  BP: (!) 142/66  Pulse: (!) 50  SpO2: 97%  Weight: 199 lb (90.3 kg)  Height: 5\' 9"  (1.753 m)   2L   Gen: well appearing, no acute distress HENT: NCAT, OP clear, neck supple without masses Eyes: PERRL, EOMi Lymph: no cervical lymphadenopathy PULM: Few rine crackles bases B, normal effort CV: RRR, no mgr, no JVD GI: BS+, soft, nontender, no hsm Derm: no rash or skin breakdown MSK: normal bulk and tone Neuro: A&Ox4, CN II-XII intact, strength 5/5 in all 4 extremities Psyche: normal mood and affect   March 2018 Hospital records reviewed were he had a robotic-assisted partial nephrectomy on the right as well as cholecystectomy for RCC and cholecystitis. Pulmonary and critical care medicine followed him at that time for hypoxemia postop. He required BiPAP post surgery.  Records from Dr. Debara Pickett from November 2016 reviewed. He had a history of chronic kidney disease, coronary artery disease status post coronary artery bypass grafting in 1996. He also had hypertension.  Cardiac imaging: January 2018 nuclear stress test showed an LVEF of 30-44%, low risk study with normal perfusion.  Echo: March 2018 echocardiogram showed an estimated PA pressure of 61 mmHg April 2018 echocardiogram showed an LVEF of 60-65% with grade 2 diastolic dysfunction, moderate  concentric left ventricular hypertrophy, left atrium was moderately dilated, pulmonary artery systolic pressure estimate 85 mmHg, right ventricle cavity size normal with normal systolic function, there is a moderate sized pericardial effusion  Imaging: March 2018 chest x-ray images independently reviewed showing bilateral hazy airspace disease with cephalization, left-sided pleural effusion      Assessment & Plan:  Chronic respiratory failure with hypoxia ([place]) He was recently started on oxygen and hospital discharge. Based on  my review of his chest imaging post surgery it seems that this was due to acute pulmonary edema. I believe that this will continue to improve with diuresis.   Today his O2 saturation dropped to 86% on RA while walking 300 feet.  With 2L O2 his O2 saturation improved.  Plan: For now he will continue using 2 L of oxygen continuously but I suspect that with continued diuresis over the next few weeks will be able to stop this altogether. We will arrange for an overnight eczematous  CHF (congestive heart failure) ([place]) He still has some findings consistent with volume overload on exam today but I agree with Dr. Lysbeth Penner approach of increasing his diuretic medicine.  Plan: Continue diuresis as per cardiology Repeat chest x-ray next visit to see if there is been resolution of the pulmonary edema  Pulmonary hypertension ([place]) His echocardiogram in March and April of this year showed elevated PA pressures. We know that patients who have left ventricular hypertrophy and left atrial enlargement have a 94% likelihood of having World Health Organization group 2 disease (elevated pulmonary capillary wedge pressure) on right heart catheterization.  So while I know he has Lexicographer group 2 disease the question is whether or not there is any degree of lung disease contributing. Given the fact that his PA pressures are so high I think it's worth evaluating for lung  disease particularly considering his prior smoking history.  He also has hypothyroidism which can contribute to pulmonary hypertension.  He may have obstructive sleep apnea based on his snoring.  Plan: F/U V/Q scan ordered by cardiology.  Lung function test Overnight oximetry test Continue diuresis Continue using oxygen.    Current Outpatient Prescriptions:  .  acetaminophen (TYLENOL) 325 MG tablet, Take 650 mg by mouth every 6 (six) hours as needed (for pain.)., Disp: , Rfl:  .  amLODipine (NORVASC) 5 MG tablet, Take 5 mg by mouth daily. , Disp: , Rfl: 0 .  aspirin 325 MG EC tablet, Take 325 mg by mouth daily., Disp: , Rfl:  .  benazepril (LOTENSIN) 40 MG tablet, Take 40 mg by mouth daily., Disp: , Rfl: 0 .  Choline Fenofibrate (TRILIPIX) 45 MG capsule, Take 45 mg by mouth daily., Disp: , Rfl:  .  cloNIDine (CATAPRES) 0.1 MG tablet, Take 0.1 mg by mouth 2 (two) times daily., Disp: , Rfl:  .  ezetimibe (ZETIA) 10 MG tablet, Take 10 mg by mouth daily., Disp: , Rfl:  .  furosemide (LASIX) 40 MG tablet, Take 1 tablet (40 mg total) by mouth 2 (two) times daily., Disp: 60 tablet, Rfl: 5 .  HUMALOG KWIKPEN 100 UNIT/ML KiwkPen, Inject 10 Units into the skin daily with supper. , Disp: , Rfl: 0 .  LEVEMIR 100 UNIT/ML injection, Inject 30 Units into the skin 2 (two) times daily. , Disp: , Rfl: 11 .  nadolol (CORGARD) 40 MG tablet, Take 40 mg by mouth daily., Disp: , Rfl:  .  rosuvastatin (CRESTOR) 20 MG tablet, Take 20 mg by mouth daily., Disp: , Rfl:  .  SYNTHROID 100 MCG tablet, Take 100 mcg by mouth daily. , Disp: , Rfl: 0 .  tetrahydrozoline 0.05 % ophthalmic solution, Place 1-2 drops into both eyes 3 (three) times daily as needed (for dry eyes)., Disp: , Rfl:  .  traMADol (ULTRAM) 50 MG tablet, Take 1-2 tablets (50-100 mg total) by mouth every 6 (six) hours as needed for moderate pain or severe pain., Disp: 30 tablet, Rfl:  0  

## 2017-03-23 NOTE — Assessment & Plan Note (Addendum)
He was recently started on oxygen and hospital discharge. Based on my review of his chest imaging post surgery it seems that this was due to acute pulmonary edema. I believe that this will continue to improve with diuresis.   Today his O2 saturation dropped to 86% on RA while walking 300 feet.  With 2L O2 his O2 saturation improved.  Plan: For now he will continue using 2 L of oxygen continuously but I suspect that with continued diuresis over the next few weeks will be able to stop this altogether. We will arrange for an overnight eczematous

## 2017-03-23 NOTE — Assessment & Plan Note (Signed)
He still has some findings consistent with volume overload on exam today but I agree with Dr. Lysbeth Penner approach of increasing his diuretic medicine.  Plan: Continue diuresis as per cardiology Repeat chest x-ray next visit to see if there is been resolution of the pulmonary edema

## 2017-03-25 ENCOUNTER — Ambulatory Visit (INDEPENDENT_AMBULATORY_CARE_PROVIDER_SITE_OTHER): Payer: Medicare Other | Admitting: Pulmonary Disease

## 2017-03-25 DIAGNOSIS — R0600 Dyspnea, unspecified: Secondary | ICD-10-CM | POA: Diagnosis not present

## 2017-03-25 LAB — PULMONARY FUNCTION TEST
DL/VA % pred: 48 %
DL/VA: 2.18 ml/min/mmHg/L
DLCO COR % PRED: 27 %
DLCO COR: 8.63 ml/min/mmHg
DLCO UNC % PRED: 26 %
DLCO unc: 8.38 ml/min/mmHg
FEF 25-75 POST: 2.57 L/s
FEF 25-75 Pre: 1.32 L/sec
FEF2575-%Change-Post: 95 %
FEF2575-%PRED-POST: 135 %
FEF2575-%PRED-PRE: 69 %
FEV1-%CHANGE-POST: 14 %
FEV1-%Pred-Post: 85 %
FEV1-%Pred-Pre: 75 %
FEV1-Post: 2.18 L
FEV1-Pre: 1.91 L
FEV1FVC-%CHANGE-POST: 12 %
FEV1FVC-%Pred-Pre: 99 %
FEV6-%Change-Post: 2 %
FEV6-%PRED-POST: 79 %
FEV6-%PRED-PRE: 77 %
FEV6-PRE: 2.57 L
FEV6-Post: 2.63 L
FEV6FVC-%Change-Post: 0 %
FEV6FVC-%PRED-PRE: 105 %
FEV6FVC-%Pred-Post: 106 %
FVC-%Change-Post: 1 %
FVC-%Pred-Post: 74 %
FVC-%Pred-Pre: 73 %
FVC-Post: 2.63 L
FVC-Pre: 2.58 L
POST FEV1/FVC RATIO: 83 %
PRE FEV1/FVC RATIO: 74 %
Post FEV6/FVC ratio: 100 %
Pre FEV6/FVC Ratio: 99 %
RV % pred: 77 %
RV: 2.09 L
TLC % pred: 68 %
TLC: 4.8 L

## 2017-03-25 NOTE — Progress Notes (Signed)
PFT done today. 

## 2017-03-26 ENCOUNTER — Ambulatory Visit (HOSPITAL_COMMUNITY)
Admission: RE | Admit: 2017-03-26 | Discharge: 2017-03-26 | Disposition: A | Discharge status: Home / Self Care | Payer: Medicare Other | Source: Ambulatory Visit | Attending: Internal Medicine | Admitting: Internal Medicine

## 2017-03-26 ENCOUNTER — Encounter (HOSPITAL_COMMUNITY)
Admission: RE | Admit: 2017-03-26 | Discharge: 2017-03-26 | Disposition: A | Discharge status: Home / Self Care | Payer: Medicare Other | Source: Ambulatory Visit | Attending: Internal Medicine | Admitting: Internal Medicine

## 2017-03-26 DIAGNOSIS — R0602 Shortness of breath: Secondary | ICD-10-CM

## 2017-03-26 DIAGNOSIS — I272 Pulmonary hypertension, unspecified: Secondary | ICD-10-CM | POA: Insufficient documentation

## 2017-03-26 DIAGNOSIS — I313 Pericardial effusion (noninflammatory): Secondary | ICD-10-CM | POA: Diagnosis not present

## 2017-03-26 DIAGNOSIS — I517 Cardiomegaly: Secondary | ICD-10-CM | POA: Insufficient documentation

## 2017-03-26 DIAGNOSIS — I3139 Other pericardial effusion (noninflammatory): Secondary | ICD-10-CM

## 2017-03-26 DIAGNOSIS — Z01818 Encounter for other preprocedural examination: Secondary | ICD-10-CM

## 2017-03-26 DIAGNOSIS — I270 Primary pulmonary hypertension: Secondary | ICD-10-CM | POA: Diagnosis not present

## 2017-03-26 MED ORDER — TECHNETIUM TC 99M DIETHYLENETRIAME-PENTAACETIC ACID: 31.7000 | Freq: Once | INTRAVENOUS | Status: DC | PRN

## 2017-03-26 MED ORDER — TECHNETIUM TO 99M ALBUMIN AGGREGATED
4.1000 | Freq: Once | INTRAVENOUS | Status: AC | PRN
  Administered 2017-03-26: 4.1 via INTRAVENOUS

## 2017-03-27 DIAGNOSIS — J95821 Acute postprocedural respiratory failure: Secondary | ICD-10-CM | POA: Diagnosis not present

## 2017-03-27 DIAGNOSIS — I5020 Unspecified systolic (congestive) heart failure: Secondary | ICD-10-CM | POA: Diagnosis not present

## 2017-03-31 DIAGNOSIS — Z79899 Other long term (current) drug therapy: Secondary | ICD-10-CM | POA: Diagnosis not present

## 2017-03-31 DIAGNOSIS — R0602 Shortness of breath: Secondary | ICD-10-CM | POA: Diagnosis not present

## 2017-03-31 LAB — BASIC METABOLIC PANEL
BUN: 14 mg/dL (ref 7–25)
CHLORIDE: 104 mmol/L (ref 98–110)
CO2: 26 mmol/L (ref 20–31)
Calcium: 9.2 mg/dL (ref 8.6–10.3)
Creat: 1.54 mg/dL — ABNORMAL HIGH (ref 0.70–1.11)
Glucose, Bld: 168 mg/dL — ABNORMAL HIGH (ref 65–99)
Potassium: 4 mmol/L (ref 3.5–5.3)
Sodium: 142 mmol/L (ref 135–146)

## 2017-04-01 LAB — BRAIN NATRIURETIC PEPTIDE: Brain Natriuretic Peptide: 596.6 pg/mL — ABNORMAL HIGH (ref <100)

## 2017-04-05 ENCOUNTER — Ambulatory Visit (HOSPITAL_COMMUNITY): Payer: Medicare Other | Attending: Cardiology

## 2017-04-05 ENCOUNTER — Other Ambulatory Visit: Payer: Self-pay

## 2017-04-05 DIAGNOSIS — I3139 Other pericardial effusion (noninflammatory): Secondary | ICD-10-CM

## 2017-04-05 DIAGNOSIS — I313 Pericardial effusion (noninflammatory): Secondary | ICD-10-CM | POA: Diagnosis not present

## 2017-04-08 ENCOUNTER — Ambulatory Visit (INDEPENDENT_AMBULATORY_CARE_PROVIDER_SITE_OTHER): Payer: Medicare Other | Admitting: Internal Medicine

## 2017-04-08 ENCOUNTER — Encounter: Payer: Self-pay | Admitting: Internal Medicine

## 2017-04-08 VITALS — BP 152/64 | HR 50 | Ht 69.0 in | Wt 195.0 lb

## 2017-04-08 DIAGNOSIS — I272 Pulmonary hypertension, unspecified: Secondary | ICD-10-CM

## 2017-04-08 DIAGNOSIS — Z951 Presence of aortocoronary bypass graft: Secondary | ICD-10-CM

## 2017-04-08 DIAGNOSIS — I313 Pericardial effusion (noninflammatory): Secondary | ICD-10-CM

## 2017-04-08 DIAGNOSIS — I3139 Other pericardial effusion (noninflammatory): Secondary | ICD-10-CM

## 2017-04-08 DIAGNOSIS — R0602 Shortness of breath: Secondary | ICD-10-CM

## 2017-04-08 NOTE — Progress Notes (Signed)
OFFICE NOTE  Chief Complaint:  Follow-up studies  Primary Care Physician: Dwan Bolt, MD  HPI:  [name] is a pleasant gentleman who I previously saw in 2014. He has a history of coronary artery disease status post CABG in 1996. He also has chronic kidney disease with a creatinine that was around 1.8. In the past he seen Dr. Moshe Cipro, however she no longer sees him. He denies any chest pain or worsening shortness of breath. He reports good blood pressure control on his current medicines. He is somewhat active but does not exercise regularly. His primary care provider has been supplying his medications however we have prescribed him in the past for nadolol. He was noted in the past to have a right bundle branch block was which was new. His last stress test was in 2012 which was negative for ischemia.  03/22/2017  [name] returns today for hospital follow-up. I have not seen him since 2016. He has a history of coronary artery disease status post CABG in 1996 as well as chronic kidney disease is had a creatinine is elevated is 1.8 in the past but apparently that improved. He was previously seen Dr. Moshe Cipro. He does have a history of chronic cholecystitis and is part of her workup for that was found to have renal cell cancer. In March she was admitted for surgery and underwent robotic-assisted right partial nephrectomy for a renal mass and combined cholecystectomy. He had difficulty in oxygenation after surgery and was monitored in the ICU for hypoxia. It was felt that there was an element of diastolic congestive heart failure. An echocardiogram was performed on 02/20/2017 which showed an EF of 65-78%, grade 2 diastolic dysfunction and moderate pulmonary hypertension with an RVSP of 61 mmHg. He underwent a subsequent limited echo on 03/09/2017 which showed an EF of 46-96%, grade 2 diastolic function, however there was an increase in RVSP to 85 mmHg suggestive of severe pulmonary  hypertension and there was a moderate localized pericardial effusion around the inferior and inferolateral walls without signs of tamponade physiology. [name] is had persistent shortness of breath and hypoxia. He was sent home on oxygen and recently noted in his urologist office to be hypoxic with an O2 sat duration of 87% on room air.  04/08/2017  [name] was seen today in follow-up. Since I last saw him and put him on diuretics his weight has come down from 204 pounds 195 pounds. He reports improvement in his breathing. We repeated an echocardiogram which shows a stable LV EF 55-60% and resolution of pericardial effusion. Of note he is RVSP is decreased to 46 mmHg, and previously was 61 mmHg. This suggests he may have had some pulmonary venous hypertension. He was also placed on oxygen after seeing Dr. Lake Bells in Pulmonary. A VQ scan was negative for pulmonary emboli.   PMHx:  Past Medical History:  Diagnosis Date  . Chronic kidney disease    tumor right kidney  . Coronary artery disease   . Diabetes mellitus    type 2  . Hypertension   . Myocardial infarction ([place])    1997  . Right renal mass     Past Surgical History:  Procedure Laterality Date  . CARDIAC CATHETERIZATION  1996  . CORONARY ARTERY BYPASS GRAFT    . DOPPLER ECHOCARDIOGRAPHY  2011  . HERNIA REPAIR    . IR GENERIC HISTORICAL  12/09/2016   IR RADIOLOGIST EVAL & MGMT 12/09/2016 [name], MD [facility] INTERV  RAD  . ROBOTIC ASSITED PARTIAL NEPHRECTOMY Right 02/19/2017   Procedure: XI ROBOTIC ASSITED PARTIAL NEPHRECTOMY;  Surgeon: [name], MD;  Location: [facility] ORS;  Service: Urology;  Laterality: Right;    FAMHx:  Family History  Problem Relation Age of Onset  . Diabetes Mother   . Diabetes Sister     SOCHx:   reports that he quit smoking about 20 years ago. His smoking use included Cigarettes. He has a 13.50 pack-year smoking history. He has never used smokeless tobacco. He reports that he does not drink alcohol  or use drugs.  ALLERGIES:  No Known Allergies  ROS: Pertinent items noted in HPI and remainder of comprehensive ROS otherwise negative.  HOME MEDS: Current Outpatient Prescriptions  Medication Sig Dispense Refill  . acetaminophen (TYLENOL) 325 MG tablet Take 650 mg by mouth every 6 (six) hours as needed (for pain.).    Marland Kitchen amLODipine (NORVASC) 5 MG tablet Take 5 mg by mouth daily.   0  . aspirin 325 MG EC tablet Take 325 mg by mouth daily.    . benazepril (LOTENSIN) 40 MG tablet Take 40 mg by mouth daily.  0  . Choline Fenofibrate (TRILIPIX) 45 MG capsule Take 45 mg by mouth daily.    . cloNIDine (CATAPRES) 0.1 MG tablet Take 0.1 mg by mouth 2 (two) times daily.    Marland Kitchen ezetimibe (ZETIA) 10 MG tablet Take 10 mg by mouth daily.    . furosemide (LASIX) 40 MG tablet Take 1 tablet (40 mg total) by mouth 2 (two) times daily. 60 tablet 5  . HUMALOG KWIKPEN 100 UNIT/ML KiwkPen Inject 10 Units into the skin daily with supper.   0  . LEVEMIR 100 UNIT/ML injection Inject 30 Units into the skin 2 (two) times daily.   11  . nadolol (CORGARD) 40 MG tablet Take 40 mg by mouth daily.    . rosuvastatin (CRESTOR) 20 MG tablet Take 20 mg by mouth daily.    Marland Kitchen SYNTHROID 100 MCG tablet Take 100 mcg by mouth daily.   0  . tetrahydrozoline 0.05 % ophthalmic solution Place 1-2 drops into both eyes 3 (three) times daily as needed (for dry eyes).    . traMADol (ULTRAM) 50 MG tablet Take 1-2 tablets (50-100 mg total) by mouth every 6 (six) hours as needed for moderate pain or severe pain. 30 tablet 0   No current facility-administered medications for this visit.     LABS/IMAGING: No results found for this or any previous visit (from the past 48 hour(s)). No results found.  WEIGHTS: Wt Readings from Last 3 Encounters:  04/08/17 195 lb (88.5 kg)  03/23/17 199 lb (90.3 kg)  03/22/17 204 lb (92.5 kg)    VITALS: BP (!) 152/64 (BP Location: Left Arm, Patient Position: Sitting, Cuff Size: Normal)   Pulse (!) 50    Ht 5\' 9"  (1.753 m)   Wt 195 lb (88.5 kg)   BMI 28.80 kg/m   EXAM: General appearance: alert, no distress and on oxygen Neck: no carotid bruit and no JVD Lungs: clear to auscultation bilaterally Heart: regular rate and rhythm, S1, S2 normal, no murmur, click, rub or gallop Abdomen: soft, non-tender; bowel sounds normal; no masses,  no organomegaly Extremities: extremities normal, atraumatic, no cyanosis or edema Pulses: 2+ and symmetric Skin: Skin color, texture, turgor normal. No rashes or lesions Neurologic: Grossly normal Psych: Normal, but did not recall seeing a pulmonologist  EKG: Deferred  ASSESSMENT:  1. Acute hypoxic respiratory failure -  likely acute diastolic CHF, improved 2. Mixed pulmonary venous/arterial hypertension 3. Moderate sized pericardial effusion - resolved 4. CAD status post CABG 1996 5. CKD with improving creatinine now 1.54  (as high as 3.84, 2.87, 2.36 recently) 6. Hypertension 7. Dyslipidemia 8. RBBB  PLAN: 1.    Mr. Heidinger reports improvement in his symptoms with diuresis. He is also had marked improvement in creatinine which was over 3.5 and now is down to about 1.5. We'll continue his diuretics. His pericardial effusion has resolved. He is still on oxygen by nasal cannula but I suspect this may be able to be removed after evaluation by pulmonary. Finally, blood pressure again 2 days over 704 systolic. He says his blood pressures are typically lower at home, but it is been elevated here. I've advised him to take home blood pressure readings and contact us with those results. He may need medication adjustment.  Follow-up with me in 3 months.  Pixie Casino, MD, [facility] Attending Cardiologist [place] 04/08/2017, 12:50 PM

## 2017-04-08 NOTE — Patient Instructions (Signed)
Your physician recommends that you continue on your current medications as directed. Please refer to the Current Medication list given to you today.  Monitor home blood pressures. Please call our office with your BP readings in about a week.  -- please check your BP no more than twice daily, after you have been sitting/resting for 5-10 minutes, at least 1 hour after taking your BP medications  Your physician wants you to follow-up in: [place] with Dr. Debara Pickett.

## 2017-04-15 ENCOUNTER — Telehealth: Payer: Self-pay

## 2017-04-15 NOTE — Telephone Encounter (Signed)
Called and spoke with pt and he is aware of results per BQ.  Nothing further is needed.

## 2017-04-15 NOTE — Telephone Encounter (Signed)
Patient returned phone call..ert ° °

## 2017-04-15 NOTE — Telephone Encounter (Signed)
lmtcb X1 for pt to make aware of results/recs.

## 2017-04-15 NOTE — Telephone Encounter (Signed)
-----   Message from Juanito Doom, MD sent at 04/12/2017  3:16 PM EDT ----- Please let him know his ONO was normal

## 2017-04-20 ENCOUNTER — Ambulatory Visit (INDEPENDENT_AMBULATORY_CARE_PROVIDER_SITE_OTHER): Payer: Medicare Other | Admitting: Adult Health

## 2017-04-20 ENCOUNTER — Encounter: Payer: Self-pay | Admitting: Adult Health

## 2017-04-20 VITALS — BP 128/64 | HR 50 | Ht 69.0 in | Wt 194.2 lb

## 2017-04-20 DIAGNOSIS — R0602 Shortness of breath: Secondary | ICD-10-CM | POA: Diagnosis not present

## 2017-04-20 DIAGNOSIS — R05 Cough: Secondary | ICD-10-CM

## 2017-04-20 DIAGNOSIS — R059 Cough, unspecified: Secondary | ICD-10-CM

## 2017-04-20 DIAGNOSIS — J9611 Chronic respiratory failure with hypoxia: Secondary | ICD-10-CM

## 2017-04-20 DIAGNOSIS — I272 Pulmonary hypertension, unspecified: Secondary | ICD-10-CM | POA: Diagnosis not present

## 2017-04-20 NOTE — Patient Instructions (Addendum)
Set up CT chest .  Set up for Sleep study .  Continue on oxygen 2l/m .  Follow up Dr. Lake Bells in 2 months and .As needed

## 2017-04-20 NOTE — Progress Notes (Addendum)
@Patient  ID: [name], male    DOB: 26-Aug-1932, 83 y.o.   MRN: [id number]  Chief Complaint  Patient presents with  . Follow-up    pulm htn     Referring provider: Anda Kraft, MD  HPI: 83 yo former smoker followed for pulmonary HTN   TEST Joya San  March 2018 Hospital records reviewed were he had a robotic-assisted partial nephrectomy on the right as well as cholecystectomy for RCC and cholecystitis. Pulmonary and critical care medicine followed him at that time for hypoxemia postop. He required BiPAP post surgery.  Cardiac imaging: January 2018 nuclear stress test showed an LVEF of 30-44%, low risk study with normal perfusion.  Echo: March 2018 echocardiogram showed an estimated PA pressure of 61 mmHg April 2018 echocardiogram showed an LVEF of 60-65% with grade 2 diastolic dysfunction, moderate concentric left ventricular hypertrophy, left atrium was moderately dilated, pulmonary artery systolic pressure estimate 85 mmHg, right ventricle cavity size normal with normal systolic function, there is a moderate sized pericardial effusion  Imaging: March 2018 chest x-ray images independently reviewed showing bilateral hazy airspace disease with cephalization, left-sided pleural effusion  ONO 04/2017 neg on O2 .  VQ scan 03/2017 neg.   04/20/2017 Follow up: Pulmonary HTN  Patient resents for a one-month follow-up. Patient was seen last visit for evaluation for pulmonary hypertension. 2-D echo in early April showed an EF of 60-65% with a grade 2 diastolic dysfunction, moderate pericardial effusion and pulmonary artery pressure at 85 mmHg.  Patient was started on Lasix 40 mg twice daily by cardiology. A repeat echo on 04/05/2017 showed an EF of 55-60%, improved. Pulmonary artery pressure at 54 mmHg, And resolved pericardial effusion Patient was referred to pulmonary for evaluation of his pulmonary hypertension. Patient was found to have persistent hypoxemia. He was started on  oxygen at 2 L.. VQ scan was negative. Overnight oximetry test showed no significant desaturations on 2 L of oxygen.  Pulmonary function test done on April 19 showed mild restriction with an FEV1 at 85%, ratio 83, FVC 74%, positive bronchodilator response, positive mid flow reversibility. DLCO 26%.. Patient says he has no previous diagnosis of asthma or COPD. He denies any wheezing. He has no shortness of breath at rest. Does get winded with walking. He is unsure if oxygen is helping his breathing or not  Patient does have snoring and daytime sleepiness.   No Known Allergies  Immunization History  Administered Date(s) Administered  . Influenza, High Dose Seasonal PF 09/22/2016  . Pneumococcal-Unspecified 08/23/2016    Past Medical History:  Diagnosis Date  . Chronic kidney disease    tumor right kidney  . Coronary artery disease   . Diabetes mellitus    type 2  . Hypertension   . Myocardial infarction ([place])    1997  . Right renal mass     Tobacco History: History  Smoking Status  . Former Smoker  . Packs/day: 0.30  . Years: 45.00  . Types: Cigarettes  . Quit date: 02/15/1997  Smokeless Tobacco  . Never Used   Counseling given: Not Answered   Outpatient Encounter Prescriptions as of 04/20/2017  Medication Sig  . acetaminophen (TYLENOL) 325 MG tablet Take 650 mg by mouth every 6 (six) hours as needed (for pain.).  Marland Kitchen amLODipine (NORVASC) 5 MG tablet Take 5 mg by mouth daily.   Marland Kitchen aspirin 325 MG EC tablet Take 325 mg by mouth daily.  . benazepril (LOTENSIN) 40 MG tablet Take 40 mg by mouth  daily.  . Choline Fenofibrate (TRILIPIX) 45 MG capsule Take 45 mg by mouth daily.  . cloNIDine (CATAPRES) 0.1 MG tablet Take 0.1 mg by mouth 2 (two) times daily.  Marland Kitchen ezetimibe (ZETIA) 10 MG tablet Take 10 mg by mouth daily.  . furosemide (LASIX) 40 MG tablet Take 1 tablet (40 mg total) by mouth 2 (two) times daily.  Marland Kitchen HUMALOG KWIKPEN 100 UNIT/ML KiwkPen Inject 10 Units into the skin  daily with supper.   Marland Kitchen LEVEMIR 100 UNIT/ML injection Inject 30 Units into the skin 2 (two) times daily.   . nadolol (CORGARD) 40 MG tablet Take 40 mg by mouth daily.  . rosuvastatin (CRESTOR) 20 MG tablet Take 20 mg by mouth daily.  Marland Kitchen SYNTHROID 100 MCG tablet Take 100 mcg by mouth daily.   Marland Kitchen tetrahydrozoline 0.05 % ophthalmic solution Place 1-2 drops into both eyes 3 (three) times daily as needed (for dry eyes).  . traMADol (ULTRAM) 50 MG tablet Take 1-2 tablets (50-100 mg total) by mouth every 6 (six) hours as needed for moderate pain or severe pain.   No facility-administered encounter medications on file as of 04/20/2017.      Review of Systems  Constitutional:   No  weight loss, night sweats,  Fevers, chills,  +fatigue, or  lassitude.  HEENT:   No headaches,  Difficulty swallowing,  Tooth/dental problems, or  Sore throat,                No sneezing, itching, ear ache, nasal congestion, post nasal drip,   CV:  No chest pain,  Orthopnea, PND, swelling in lower extremities, anasarca, dizziness, palpitations, syncope.   GI  No heartburn, indigestion, abdominal pain, nausea, vomiting, diarrhea, change in bowel habits, loss of appetite, bloody stools.   Resp:  .  No chest wall deformity  Skin: no rash or lesions.  GU: no dysuria, change in color of urine, no urgency or frequency.  No flank pain, no hematuria   MS:  No joint pain or swelling.  No decreased range of motion.  No back pain.    Physical Exam  BP 128/64 (BP Location: Left Arm, Cuff Size: Normal)   Pulse (!) 50   Ht 5\' 9"  (1.753 m)   Wt 194 lb 3.2 oz (88.1 kg)   SpO2 98%   BMI 28.68 kg/m   GEN: A/Ox3; pleasant , NAD, elderly on O2    HEENT:  [place]/AT,  EACs-clear, TMs-wnl, NOSE-clear, THROAT-clear, no lesions, no postnasal drip or exudate noted.   NECK:  Supple w/ fair ROM; no JVD; normal carotid impulses w/o bruits; no thyromegaly or nodules palpated; no lymphadenopathy.    RESP  Clear  P & A; w/o, wheezes/  rales/ or rhonchi. no accessory muscle use, no dullness to percussion  CARD:  RRR, no m/r/g, tr  -1 peripheral edema, pulses intact, no cyanosis or clubbing.  GI:   Soft & nt; nml bowel sounds; no organomegaly or masses detected.   Musco: Warm bil, no deformities or joint swelling noted.   Neuro: alert, no focal deficits noted.    Skin: Warm, no lesions or rashes   Imaging: Dg Chest 2 View  Result Date: 03/26/2017 CLINICAL DATA:  Shortness of breath for couple months. EXAM: CHEST  2 VIEW COMPARISON:  02/24/2017 FINDINGS: The cardio pericardial silhouette is enlarged. There is pulmonary vascular congestion without overt pulmonary edema. Probable underlying interstitial lung disease. Patient is status post CABG. Possible tiny left pleural effusion. IMPRESSION: Cardiomegaly with vascular  congestion and probable tiny effusion. Electronically Signed   By: Misty Stanley M.D.   On: 03/26/2017 14:31   Nm Pulmonary Perf And Vent  Result Date: 03/26/2017 CLINICAL DATA:  Pulmonary hypertension. Dyspnea on exertion. No oxygen. EXAM: NUCLEAR MEDICINE VENTILATION - PERFUSION LUNG SCAN TECHNIQUE: Ventilation images were obtained in multiple projections using inhaled aerosol Tc-38m DTPA. Perfusion images were obtained in multiple projections after intravenous injection of Tc-39m MAA. RADIOPHARMACEUTICALS:  37.7 mCi Technetium-51m DTPA aerosol inhalation and 4.0 mCi Technetium-53m MAA IV COMPARISON:  Radiograph 03/26/2017 FINDINGS: Ventilation: No focal ventilation defect. Perfusion: No wedge shaped peripheral perfusion defects to suggest acute pulmonary embolism. IMPRESSION: No evidence acute or chronic pulmonary embolism. Electronically Signed   By: Suzy Bouchard M.D.   On: 03/26/2017 14:11     Assessment & Plan:   Pulmonary hypertension (HCC) Moderate Pulmonary HTN  Repeat echo does show improved PAP and resolved pericardial effusion with diureiss and O2.  Workup : VQ neg.  PFT show mild  restriction and severe diffucing defect . Will need HRCT chest to r/o ILD .  ONO adequate on O2 however may still have OSA that could be underlying (snoring and daytime sleepienss.  Check sleep study  (in lab study as hx of CHF )   Plan  Patient Instructions  Set up CT chest .  Set up for Sleep study .  Continue on oxygen 2l/m .  Follow up Dr. Lake Bells in 2 months and .As needed        Chronic respiratory failure with hypoxia (HCC) Cont on O2 .   Shortness of breath PFT does show some mild restriction along w/ small airway obstruction w/ reversibility. ? Asthma component although has no wheezing .  Hold on inhalers at this time.        Rexene Edison, NP 04/20/2017

## 2017-04-20 NOTE — Assessment & Plan Note (Signed)
PFT does show some mild restriction along w/ small airway obstruction w/ reversibility. ? Asthma component although has no wheezing .  Hold on inhalers at this time.

## 2017-04-20 NOTE — Assessment & Plan Note (Signed)
Moderate Pulmonary HTN  Repeat echo does show improved PAP and resolved pericardial effusion with diureiss and O2.  Workup : VQ neg.  PFT show mild restriction and severe diffucing defect . Will need HRCT chest to r/o ILD .  ONO adequate on O2 however may still have OSA that could be underlying (snoring and daytime sleepienss.  Check sleep study  (in lab study as hx of CHF )   Plan  Patient Instructions  Set up CT chest .  Set up for Sleep study .  Continue on oxygen 2l/m .  Follow up [name] in 2 months and .As needed

## 2017-04-20 NOTE — Progress Notes (Signed)
Reviewed, agree 

## 2017-04-20 NOTE — Assessment & Plan Note (Signed)
Cont on O2 .  

## 2017-04-28 ENCOUNTER — Ambulatory Visit (INDEPENDENT_AMBULATORY_CARE_PROVIDER_SITE_OTHER)
Admission: RE | Admit: 2017-04-28 | Discharge: 2017-04-28 | Disposition: A | Discharge status: Home / Self Care | Payer: Medicare Other | Source: Ambulatory Visit | Attending: Adult Health | Admitting: Adult Health

## 2017-04-28 DIAGNOSIS — R05 Cough: Secondary | ICD-10-CM

## 2017-04-28 DIAGNOSIS — R059 Cough, unspecified: Secondary | ICD-10-CM

## 2017-04-28 DIAGNOSIS — J449 Chronic obstructive pulmonary disease, unspecified: Secondary | ICD-10-CM | POA: Diagnosis not present

## 2017-04-29 ENCOUNTER — Encounter: Payer: Self-pay | Admitting: Adult Health

## 2017-04-29 DIAGNOSIS — J439 Emphysema, unspecified: Secondary | ICD-10-CM | POA: Insufficient documentation

## 2017-04-29 DIAGNOSIS — R911 Solitary pulmonary nodule: Secondary | ICD-10-CM | POA: Insufficient documentation

## 2017-04-30 ENCOUNTER — Other Ambulatory Visit: Payer: Self-pay | Admitting: Pulmonary Disease

## 2017-04-30 ENCOUNTER — Telehealth: Payer: Self-pay | Admitting: Internal Medicine

## 2017-04-30 DIAGNOSIS — R911 Solitary pulmonary nodule: Secondary | ICD-10-CM

## 2017-04-30 NOTE — Telephone Encounter (Signed)
Returned call to patient. He states he felt OK the day he had BP 228/64. His BP machine is now not working  Will inform MD

## 2017-04-30 NOTE — Progress Notes (Signed)
Ct hest

## 2017-04-30 NOTE — Telephone Encounter (Signed)
New message    Pt is calling to report his blood pressure readings. 140/50 140/85 228/64

## 2017-06-21 ENCOUNTER — Ambulatory Visit (HOSPITAL_BASED_OUTPATIENT_CLINIC_OR_DEPARTMENT_OTHER): Payer: Medicare Other | Attending: Adult Health | Admitting: Pulmonary Disease

## 2017-06-21 VITALS — Ht 69.0 in | Wt 170.0 lb

## 2017-06-21 DIAGNOSIS — G4733 Obstructive sleep apnea (adult) (pediatric): Secondary | ICD-10-CM | POA: Insufficient documentation

## 2017-06-21 DIAGNOSIS — J9611 Chronic respiratory failure with hypoxia: Secondary | ICD-10-CM | POA: Insufficient documentation

## 2017-06-21 DIAGNOSIS — G4736 Sleep related hypoventilation in conditions classified elsewhere: Secondary | ICD-10-CM | POA: Insufficient documentation

## 2017-06-29 ENCOUNTER — Ambulatory Visit (INDEPENDENT_AMBULATORY_CARE_PROVIDER_SITE_OTHER): Payer: Medicare Other | Admitting: Pulmonary Disease

## 2017-06-29 VITALS — BP 144/76 | HR 54 | Ht 69.0 in | Wt 193.0 lb

## 2017-06-29 DIAGNOSIS — J9611 Chronic respiratory failure with hypoxia: Secondary | ICD-10-CM

## 2017-06-29 DIAGNOSIS — J432 Centrilobular emphysema: Secondary | ICD-10-CM

## 2017-06-29 DIAGNOSIS — E032 Hypothyroidism due to medicaments and other exogenous substances: Secondary | ICD-10-CM | POA: Diagnosis not present

## 2017-06-29 DIAGNOSIS — I272 Pulmonary hypertension, unspecified: Secondary | ICD-10-CM

## 2017-06-29 DIAGNOSIS — G4733 Obstructive sleep apnea (adult) (pediatric): Secondary | ICD-10-CM | POA: Diagnosis not present

## 2017-06-29 DIAGNOSIS — E789 Disorder of lipoprotein metabolism, unspecified: Secondary | ICD-10-CM | POA: Diagnosis not present

## 2017-06-29 DIAGNOSIS — G4730 Sleep apnea, unspecified: Secondary | ICD-10-CM | POA: Diagnosis not present

## 2017-06-29 DIAGNOSIS — Z125 Encounter for screening for malignant neoplasm of prostate: Secondary | ICD-10-CM | POA: Diagnosis not present

## 2017-06-29 DIAGNOSIS — I10 Essential (primary) hypertension: Secondary | ICD-10-CM | POA: Diagnosis not present

## 2017-06-29 DIAGNOSIS — E118 Type 2 diabetes mellitus with unspecified complications: Secondary | ICD-10-CM | POA: Diagnosis not present

## 2017-06-29 NOTE — Procedures (Signed)
Patient Name: [name], [name] Date: 06/21/2017 Gender: Male D.O.B: [birth year] Age (years): 47 Referring Provider: Lynelle Smoke Parrett Height (inches): 69 Interpreting Physician: Kara Mead MD, ABSM Weight (lbs): 170 RPSGT: Zadie Rhine BMI: 25 MRN: [id number] Neck Size: 16.50   CLINICAL INFORMATION Sleep Study Type: NPSG  Indication for sleep study: severe pulmonary hypertension & chronic respiratory failure requiring 2l O2  Epworth Sleepiness Score:10  SLEEP STUDY TECHNIQUE As per the AASM Manual for the Scoring of Sleep and Associated Events v2.3 (April 2016) with a hypopnea requiring 4% desaturations.  The channels recorded and monitored were frontal, central and occipital EEG, electrooculogram (EOG), submentalis EMG (chin), nasal and oral airflow, thoracic and abdominal wall motion, anterior tibialis EMG, snore microphone, electrocardiogram, and pulse oximetry.  SLEEP ARCHITECTURE The study was initiated at 10:32:40 PM and ended at 4:25:31 AM.  Sleep onset time was 19.1 minutes and the sleep efficiency was 59.1%. The total sleep time was 208.5 minutes.  Stage REM latency was 47.5 minutes.  The patient spent 18.94% of the night in stage N1 sleep, 48.92% in stage N2 sleep, 0.00% in stage N3 and 32.13% in REM.  Alpha intrusion was absent.  Supine sleep was 67.85%.  RESPIRATORY PARAMETERS The overall apnea/hypopnea index (AHI) was 8.3 per hour. There were 9 total apneas, including 9 obstructive, 0 central and 0 mixed apneas. There were 20 hypopneas and 76 RERAs.  The AHI during Stage REM sleep was 13.4 per hour.  AHI while supine was 11.9 per hour.  The mean oxygen saturation was 92.34%. The minimum SpO2 during sleep was 84.00%.  Soft snoring was noted during this study.  CARDIAC DATA The 2 lead EKG demonstrated sinus rhythm. The mean heart rate was 46.71 beats per minute. Other EKG findings include: None.   LEG MOVEMENT DATA The total PLMS were 0 with a resulting  PLMS index of 0.00. Associated arousal with leg movement index was 0.0 .  IMPRESSIONS - Mild obstructive sleep apnea occurred during this study (AHI = 8.3/h). Significant RERAs with RDI 30/h - No significant central sleep apnea occurred during this study (CAI = 0.0/h). - Mild oxygen desaturation was noted during this study (Min O2 = 84.00%). The study was performed on 2L O2 - The patient snored with Soft snoring volume. - No cardiac abnormalities were noted during this study. - Clinically significant periodic limb movements did not occur during sleep. No significant associated arousals.   DIAGNOSIS - Obstructive Sleep Apnea (327.23 [G47.33 ICD-10]) - Nocturnal Hypoxemia (327.26 [G47.36 ICD-10])   RECOMMENDATIONS - Would recommend treatment given high number of RERAs & high RDI. Therapeutic CPAP titration to determine optimal pressure required to alleviate sleep disordered breathing. Alternatively autoCPAP can be used - Continue 2 L o2 blended into CPAP - Avoid alcohol, sedatives and other CNS depressants that may worsen sleep apnea and disrupt normal sleep architecture. - Sleep hygiene should be reviewed to assess factors that may improve sleep quality. - Weight management and regular exercise should be initiated or continued if appropriate.    Kara Mead MD Board Certified in [place]

## 2017-06-29 NOTE — Patient Instructions (Signed)
For your obstructive sleep apnea: We will arrange for a CPAP titration study In the meantime we will have you use 3L O2 every night when you sleep  For your chronic respiratory failure with hypoxemia: Use oxygen when you walk around  For your emphysema: There are no need for a medicine for this right now  For your heart failure: Continue taking diuretics Follow-up with cardiology  We will see you back in about 3 months or sooner if needed

## 2017-06-29 NOTE — Progress Notes (Signed)
Subjective:    Patient ID: [name], male    DOB: 1932/12/01, 83 y.o.   MRN: [id number]  Synopsis: Referred in April 2018 by Dr. Mali Hilty from cardiology for evaluation of pulmonary hypertension.  He used to smoke cigarettes, he notes that he smoked for 50 years, < 1 ppd.    HPI Chief Complaint  Patient presents with  . Follow-up    pt states he is doing well.  does not occasional sob with exertion- worse with stairs/incline.      Mr. Pascucci reports that his breathing has been OK.  No increase in leg swelling or dyspnea.  In general he feels well.  He is still using oxygen at night.    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 Chronic kidney disease    tumor right kidney  . Coronary artery disease   . Diabetes mellitus    type 2  . Hypertension   . Myocardial infarction ([place])    1997  . Right renal mass      Family History  Problem Relation Age of Onset  . Diabetes Mother   . Diabetes Sister         Review of Systems  Constitutional: Negative for fever and unexpected weight change.  HENT: Negative for congestion, dental problem, ear pain, nosebleeds, postnasal drip, rhinorrhea, sinus pressure, sneezing, sore throat and trouble swallowing.   Eyes: Negative for redness and itching.  Respiratory: Positive for shortness of breath. Negative for cough, chest tightness and wheezing.   Cardiovascular: Negative for palpitations and leg swelling.  Gastrointestinal: Negative for nausea and vomiting.  Genitourinary: Negative for dysuria.  Musculoskeletal: Negative for joint  swelling.  Skin: Negative for rash.  Neurological: Negative for headaches.  Hematological: Does not bruise/bleed easily.  Psychiatric/Behavioral: Negative for dysphoric mood. The patient is not nervous/anxious.        Objective:   Physical Exam  Vitals:   06/29/17 1122  BP: (!) 144/76  Pulse: (!) 54  SpO2: 93%  Weight: 193 lb (87.5 kg)  Height: 5\' 9"  (1.753 m)   2L   Gen: well appearing HENT: OP clear, TM's clear, neck supple PULM: Poor air movement, diminished left base B, normal percussion CV: RRR, no mgr, trace edema GI: BS+, soft, nontender Derm: no cyanosis or rash Psyche: normal mood and affect    Records from her visit with our nurse practitioner reviewed were a sleep study and CT scan of the chest were ordered.  Records from Dr. Debara Pickett from November 2016 reviewed. He had a history of chronic kidney disease, coronary artery disease status post coronary artery bypass grafting in 1996. He also had hypertension.  Cardiac imaging: January 2018 nuclear stress test showed an LVEF of 30-44%, low risk study with normal perfusion.  Echo: March 2018 echocardiogram showed an estimated PA pressure of 61 mmHg April 2018 echocardiogram showed an LVEF of 60-65% with grade 2 diastolic dysfunction, moderate concentric left ventricular hypertrophy, left atrium was moderately dilated, pulmonary artery systolic pressure estimate 85 mmHg, right  ventricle cavity size normal with normal systolic function, there is a moderate sized pericardial effusion  Imaging: March 2018 chest x-ray images independently reviewed showing bilateral hazy airspace disease with cephalization, left-sided pleural effusion 2018 high-resolution CT scan of the chest shows upper lobe predominant moderate centrilobular emphysema, left-sided pleural effusion, some interlobular septal thickening and based on lymphadenopathy consistent with congestive heart failure, 3 mm right lower lobe solitary pulmonary nodule,  images and apparently reviewed, this is my interpretation  Sleep study: July 2018 polysomnogram: Mild obstructive sleep apnea, AHI 8.3, O2 saturation dropped to 84% on 2 L nasal cannula      Assessment & Plan:  Chronic respiratory failure with hypoxia (HCC)  OSA (obstructive sleep apnea) - Plan: Cpap titration  Pulmonary hypertension ([place])  Centrilobular emphysema (HCC)    Discussion: Lyon has secondary pulmonary hypertension in the setting of systolic heart failure, centrilobular emphysema, chronic respiratory failure with hypoxemia, and obstructive sleep apnea. His sleep study this year showed that he has mild obstructive sleep apnea with persistent hypoxemia despite sleeping with 2 L of oxygen. Based on his chronic heart issues and the severity of his pulmonary hypertension I recommend CPAP titration. If he is unable to tolerate CPAP then we can just use a higher dose of oxygen at night while he sleeping.  He somewhat stoic and reports no symptoms of shortness of breath or cough. He clearly has centrilobular emphysema on the CT scan of his chest. Given his lack of symptoms at this time I see no benefit to adding an inhaled therapy. However if he notes any sort of chest tightness or shortness of breath or wheezing then we would be quick to add something like Stiolto or Anoro.  His chest imaging this year did show persistent fluid retention in his lungs so he may benefit from further diuretics. I'll defer to cardiology on this.  Plan: For your obstructive sleep apnea: We will arrange for a CPAP titration study In the meantime we will have you use 3L O2 every night when you sleep  For your chronic respiratory failure with hypoxemia: Use oxygen when you walk around  For your emphysema: There are no need for a medicine for this right now  For your heart failure: Continue taking diuretics Follow-up with cardiology  We will see you back in about 3 months or sooner if  needed   Current Outpatient Prescriptions:  .  acetaminophen (TYLENOL) 325 MG tablet, Take 650 mg by mouth every 6 (six) hours as needed (for pain.)., Disp: , Rfl:  .  amLODipine (NORVASC) 5 MG tablet, Take 5 mg by mouth daily. , Disp: , Rfl: 0 .  aspirin 325 MG EC tablet, Take 325 mg by mouth daily., Disp: , Rfl:  .  benazepril (LOTENSIN) 40 MG tablet, Take 40 mg by mouth daily., Disp: , Rfl: 0 .  Choline Fenofibrate (TRILIPIX) 45 MG capsule, Take 45 mg by mouth daily., Disp: , Rfl:  .  cloNIDine (CATAPRES) 0.1 MG tablet, Take 0.1 mg by mouth 2 (two) times daily., Disp: , Rfl:  .  ezetimibe (ZETIA) 10 MG tablet, Take 10 mg by mouth daily., Disp: , Rfl:  .  furosemide (LASIX) 40 MG tablet, Take 1 tablet (40 mg total) by mouth 2 (two) times daily., Disp: 60 tablet, Rfl: 5 .  HUMALOG KWIKPEN 100 UNIT/ML KiwkPen, Inject 10 Units into the skin daily with supper. , Disp: , Rfl: 0 .  LEVEMIR 100 UNIT/ML injection, Inject 30 Units into the skin  2 (two) times daily. , Disp: , Rfl: 11 .  nadolol (CORGARD) 40 MG tablet, Take 40 mg by mouth daily., Disp: , Rfl:  .  rosuvastatin (CRESTOR) 20 MG tablet, Take 20 mg by mouth daily., Disp: , Rfl:  .  SYNTHROID 100 MCG tablet, Take 100 mcg by mouth daily. , Disp: , Rfl: 0 .  tetrahydrozoline 0.05 % ophthalmic solution, Place 1-2 drops into both eyes 3 (three) times daily as needed (for dry eyes)., Disp: , Rfl:  .  traMADol (ULTRAM) 50 MG tablet, Take 1-2 tablets (50-100 mg total) by mouth every 6 (six) hours as needed for moderate pain or severe pain., Disp: 30 tablet, Rfl: 0

## 2017-06-30 ENCOUNTER — Encounter: Payer: Self-pay | Admitting: Pulmonary Disease

## 2017-07-06 DIAGNOSIS — I10 Essential (primary) hypertension: Secondary | ICD-10-CM | POA: Diagnosis not present

## 2017-07-06 DIAGNOSIS — Z794 Long term (current) use of insulin: Secondary | ICD-10-CM | POA: Diagnosis not present

## 2017-07-06 DIAGNOSIS — E118 Type 2 diabetes mellitus with unspecified complications: Secondary | ICD-10-CM | POA: Diagnosis not present

## 2017-07-06 DIAGNOSIS — E032 Hypothyroidism due to medicaments and other exogenous substances: Secondary | ICD-10-CM | POA: Diagnosis not present

## 2017-07-06 DIAGNOSIS — E782 Mixed hyperlipidemia: Secondary | ICD-10-CM | POA: Diagnosis not present

## 2017-07-26 ENCOUNTER — Encounter: Payer: Self-pay | Admitting: Internal Medicine

## 2017-07-26 ENCOUNTER — Ambulatory Visit (INDEPENDENT_AMBULATORY_CARE_PROVIDER_SITE_OTHER): Payer: Medicare Other | Admitting: Internal Medicine

## 2017-07-26 VITALS — BP 138/64 | HR 53 | Ht 69.0 in | Wt 197.0 lb

## 2017-07-26 DIAGNOSIS — I2510 Atherosclerotic heart disease of native coronary artery without angina pectoris: Secondary | ICD-10-CM

## 2017-07-26 DIAGNOSIS — I272 Pulmonary hypertension, unspecified: Secondary | ICD-10-CM | POA: Diagnosis not present

## 2017-07-26 DIAGNOSIS — I10 Essential (primary) hypertension: Secondary | ICD-10-CM | POA: Diagnosis not present

## 2017-07-26 DIAGNOSIS — Z951 Presence of aortocoronary bypass graft: Secondary | ICD-10-CM

## 2017-07-26 NOTE — Progress Notes (Signed)
OFFICE NOTE  Chief Complaint:  No complaints  Primary Care Physician: Anda Kraft, MD  HPI:  [name] is a pleasant gentleman who I previously saw in 2014. He has a history of coronary artery disease status post CABG in 1996. He also has chronic kidney disease with a creatinine that was around 1.8. In the past he seen Dr. Moshe Cipro, however she no longer sees him. He denies any chest pain or worsening shortness of breath. He reports good blood pressure control on his current medicines. He is somewhat active but does not exercise regularly. His primary care provider has been supplying his medications however we have prescribed him in the past for nadolol. He was noted in the past to have a right bundle branch block was which was new. His last stress test was in 2012 which was negative for ischemia.  03/22/2017  [name] returns today for hospital follow-up. I have not seen him since 2016. He has a history of coronary artery disease status post CABG in 1996 as well as chronic kidney disease is had a creatinine is elevated is 1.8 in the past but apparently that improved. He was previously seen Dr. Moshe Cipro. He does have a history of chronic cholecystitis and is part of her workup for that was found to have renal cell cancer. In March she was admitted for surgery and underwent robotic-assisted right partial nephrectomy for a renal mass and combined cholecystectomy. He had difficulty in oxygenation after surgery and was monitored in the ICU for hypoxia. It was felt that there was an element of diastolic congestive heart failure. An echocardiogram was performed on 02/20/2017 which showed an EF of 88-82%, grade 2 diastolic dysfunction and moderate pulmonary hypertension with an RVSP of 61 mmHg. He underwent a subsequent limited echo on 03/09/2017 which showed an EF of 80-03%, grade 2 diastolic function, however there was an increase in RVSP to 85 mmHg suggestive of severe pulmonary  hypertension and there was a moderate localized pericardial effusion around the inferior and inferolateral walls without signs of tamponade physiology. [name] is had persistent shortness of breath and hypoxia. He was sent home on oxygen and recently noted in his urologist office to be hypoxic with an O2 sat duration of 87% on room air.  04/08/2017  [name] was seen today in follow-up. Since I last saw him and put him on diuretics his weight has come down from 204 pounds 195 pounds. He reports improvement in his breathing. We repeated an echocardiogram which shows a stable LV EF 55-60% and resolution of pericardial effusion. Of note he is RVSP is decreased to 46 mmHg, and previously was 61 mmHg. This suggests he may have had some pulmonary venous hypertension. He was also placed on oxygen after seeing Dr. Lake Bells in Pulmonary. A VQ scan was negative for pulmonary emboli.   07/26/2017  [name] returns for follow-up. He is doing exceedingly well. His previous dimension his EF has normalized. He's maintained a stable weight on twice daily Lasix. He's followed up with Dr. Lake Bells who has diagnosed him with obstructive sleep apnea. He has an upcoming CPAP titration. He is on oxygen, mostly at night and with exertion. In general he feels much better. Creatinine has also improved. Recent labs from his primary care provider were also all within normal limits.  PMHx:  Past Medical History:  Diagnosis Date  . Chronic kidney disease    tumor right kidney  . Coronary artery disease   .  Diabetes mellitus    type 2  . Hypertension   . Myocardial infarction ([place])    1997  . Right renal mass     Past Surgical History:  Procedure Laterality Date  . CARDIAC CATHETERIZATION  1996  . CORONARY ARTERY BYPASS GRAFT    . DOPPLER ECHOCARDIOGRAPHY  2011  . HERNIA REPAIR    . IR GENERIC HISTORICAL  12/09/2016   IR RADIOLOGIST EVAL & MGMT 12/09/2016 [name], MD [facility] INTERV RAD  . ROBOTIC ASSITED PARTIAL  NEPHRECTOMY Right 02/19/2017   Procedure: XI ROBOTIC ASSITED PARTIAL NEPHRECTOMY;  Surgeon: [name], MD;  Location: [facility];  Service: Urology;  Laterality: Right;    FAMHx:  Family History  Problem Relation Age of Onset  . Diabetes Mother   . Diabetes Sister     SOCHx:   reports that he quit smoking about 20 years ago. His smoking use included Cigarettes. He has a 13.50 pack-year smoking history. He has never used smokeless tobacco. He reports that he does not drink alcohol or use drugs.  ALLERGIES:  No Known Allergies  ROS: Pertinent items noted in HPI and remainder of comprehensive ROS otherwise negative.  HOME MEDS: Current Outpatient Prescriptions  Medication Sig Dispense Refill  . acetaminophen (TYLENOL) 325 MG tablet Take 650 mg by mouth every 6 (six) hours as needed (for pain.).    Marland Kitchen amLODipine (NORVASC) 5 MG tablet Take 5 mg by mouth daily.   0  . aspirin 325 MG EC tablet Take 325 mg by mouth daily.    . benazepril (LOTENSIN) 40 MG tablet Take 40 mg by mouth daily.  0  . Choline Fenofibrate (TRILIPIX) 45 MG capsule Take 45 mg by mouth daily.    . cloNIDine (CATAPRES) 0.1 MG tablet Take 0.1 mg by mouth 2 (two) times daily.    Marland Kitchen ezetimibe (ZETIA) 10 MG tablet Take 10 mg by mouth daily.    . furosemide (LASIX) 40 MG tablet Take 1 tablet (40 mg total) by mouth 2 (two) times daily. 60 tablet 5  . HUMALOG KWIKPEN 100 UNIT/ML KiwkPen Inject 10 Units into the skin daily with supper.   0  . LEVEMIR 100 UNIT/ML injection Inject 30 Units into the skin 2 (two) times daily.   11  . nadolol (CORGARD) 40 MG tablet Take 40 mg by mouth daily.    . rosuvastatin (CRESTOR) 20 MG tablet Take 20 mg by mouth daily.    Marland Kitchen SYNTHROID 100 MCG tablet Take 100 mcg by mouth daily.   0  . tetrahydrozoline 0.05 % ophthalmic solution Place 1-2 drops into both eyes 3 (three) times daily as needed (for dry eyes).    . traMADol (ULTRAM) 50 MG tablet Take 1-2 tablets (50-100 mg total) by mouth every  6 (six) hours as needed for moderate pain or severe pain. 30 tablet 0   No current facility-administered medications for this visit.     LABS/IMAGING: No results found for this or any previous visit (from the past 48 hour(s)). No results found.  WEIGHTS: Wt Readings from Last 3 Encounters:  07/26/17 197 lb (89.4 kg)  06/29/17 193 lb (87.5 kg)  06/21/17 170 lb (77.1 kg)    VITALS: BP 138/64   Pulse (!) 53   Ht 5\' 9"  (1.753 m)   Wt 197 lb (89.4 kg)   BMI 29.09 kg/m   EXAM: General appearance: alert, no distress and on oxygen Neck: no carotid bruit and no JVD Lungs: clear to auscultation  bilaterally Heart: regular rate and rhythm, S1, S2 normal, no murmur, click, rub or gallop Abdomen: soft, non-tender; bowel sounds normal; no masses,  no organomegaly Extremities: extremities normal, atraumatic, no cyanosis or edema Pulses: 2+ and symmetric Skin: Skin color, texture, turgor normal. No rashes or lesions Neurologic: Grossly normal Psych: Normal, but did not recall seeing a pulmonologist  EKG: Sinus bradycardia 53, lateral T-wave changes-personally reviewed  ASSESSMENT:  1. Acute hypoxic respiratory failure - likely acute diastolic CHF, improved (EF 55-60%, 03/2017) 2. Mixed pulmonary venous/arterial hypertension 3. Moderate sized pericardial effusion - resolved 4. CAD status post CABG 1996 5. CKD with improving creatinine now 1.54  (as high as 3.84, 2.87, 2.36 recently) 6. Hypertension 7. Dyslipidemia 8. RBBB 9. OSA on CPAP  PLAN: 1.    [name] seems to be doing well to stabilize current dose of diuretics. Creatinine has improved. He was recently diagnosed with obstructive sleep apnea and will start on CPAP. In general though he feels better. He is on oxygen at night. Blood pressure is well-controlled. Continue current medications we'll see him back in 6 months.  Pixie Casino, MD, [facility] Attending Cardiologist [place] 07/26/2017, 1:07 PM

## 2017-07-26 NOTE — Patient Instructions (Signed)
Your physician wants you to follow-up in: [place] will receive a reminder letter in the mail two months in advance. If you don't receive a letter, please call our office to schedule the follow-up appointment.   If you need a refill on your cardiac medications before your next appointment, please call your pharmacy.

## 2017-08-08 IMAGING — CR DG CHEST 2V
2 series · 2 of 2 positions shown · non-contrast
Comparison: 02/24/2017

CLINICAL DATA: Shortness of breath for couple months.

EXAM:
CHEST  2 VIEW

[chest pa]
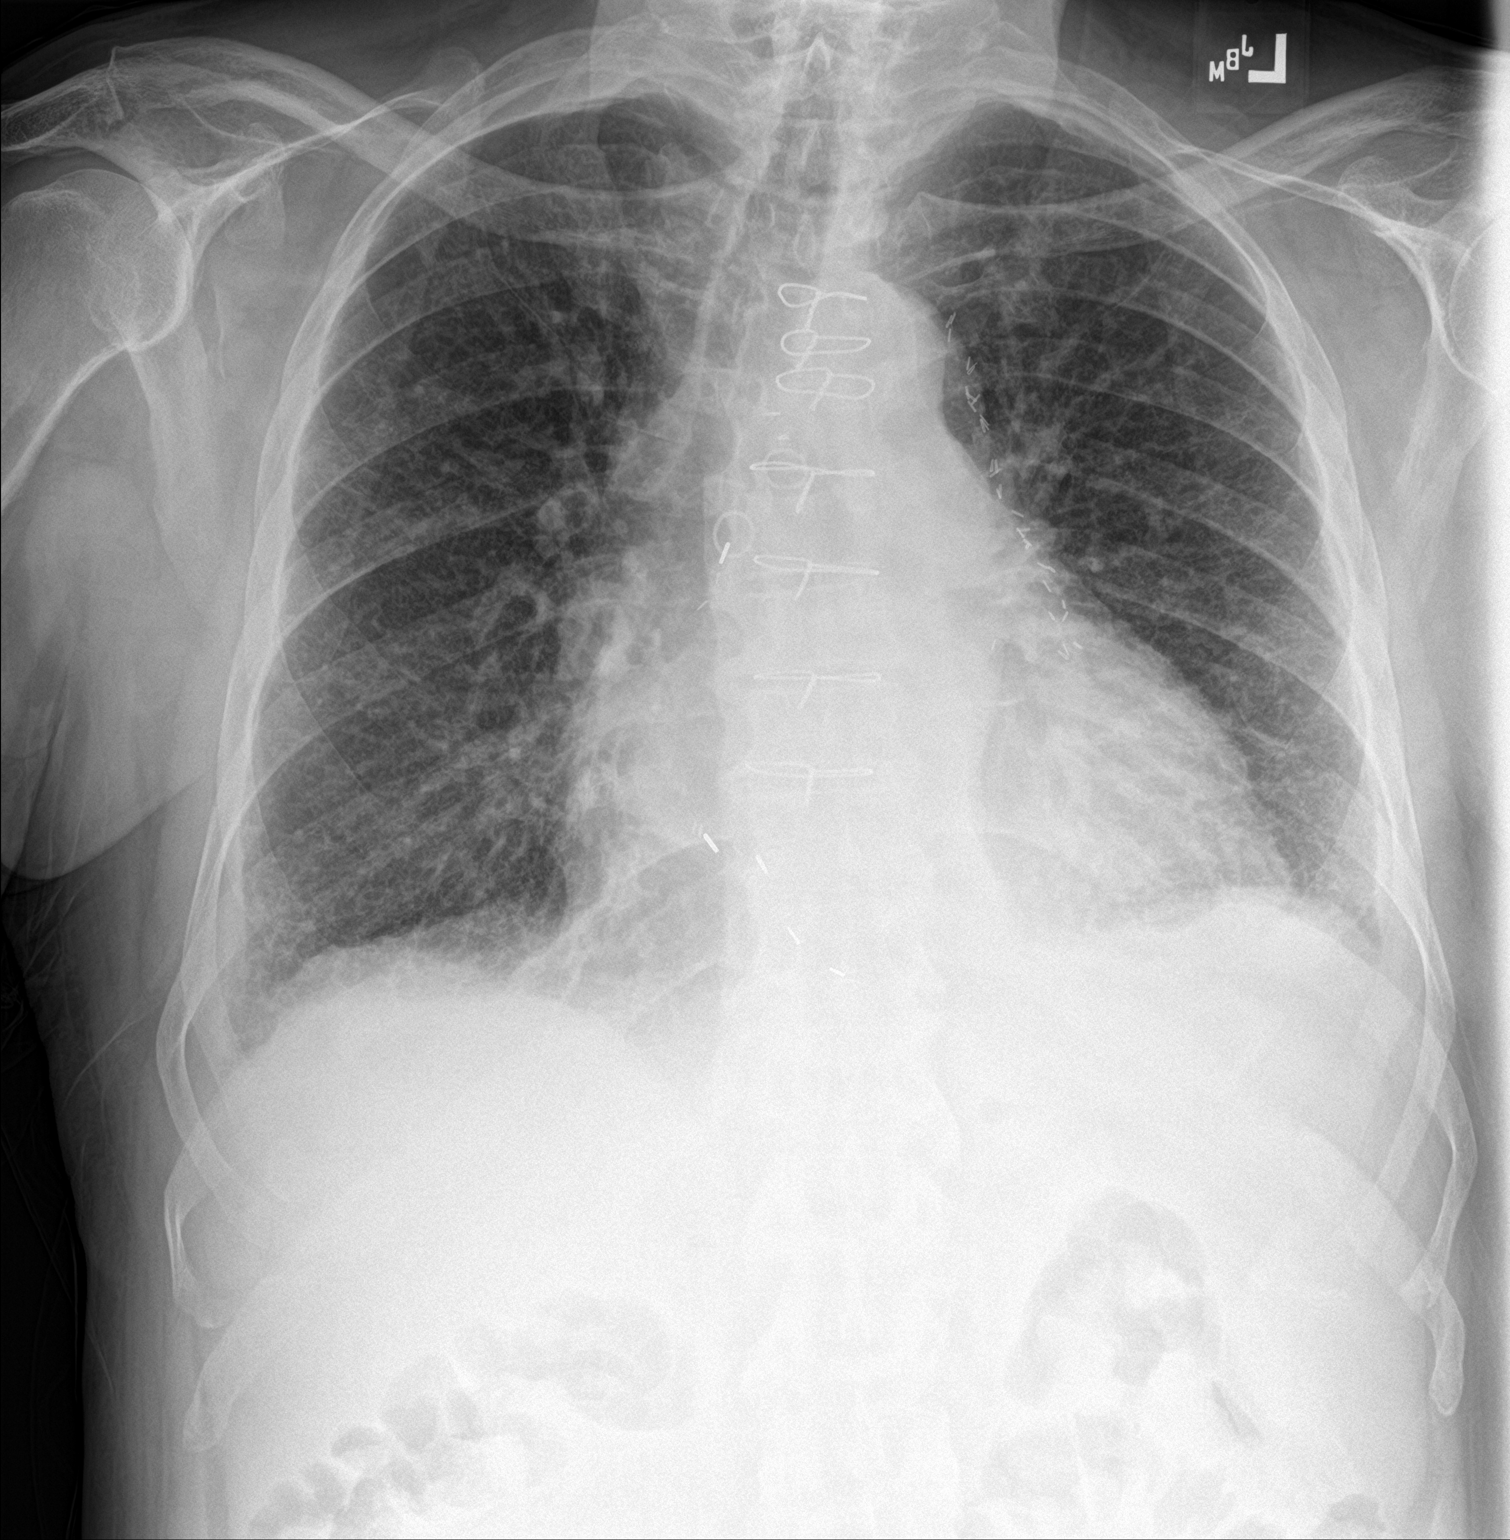

[chest lat]
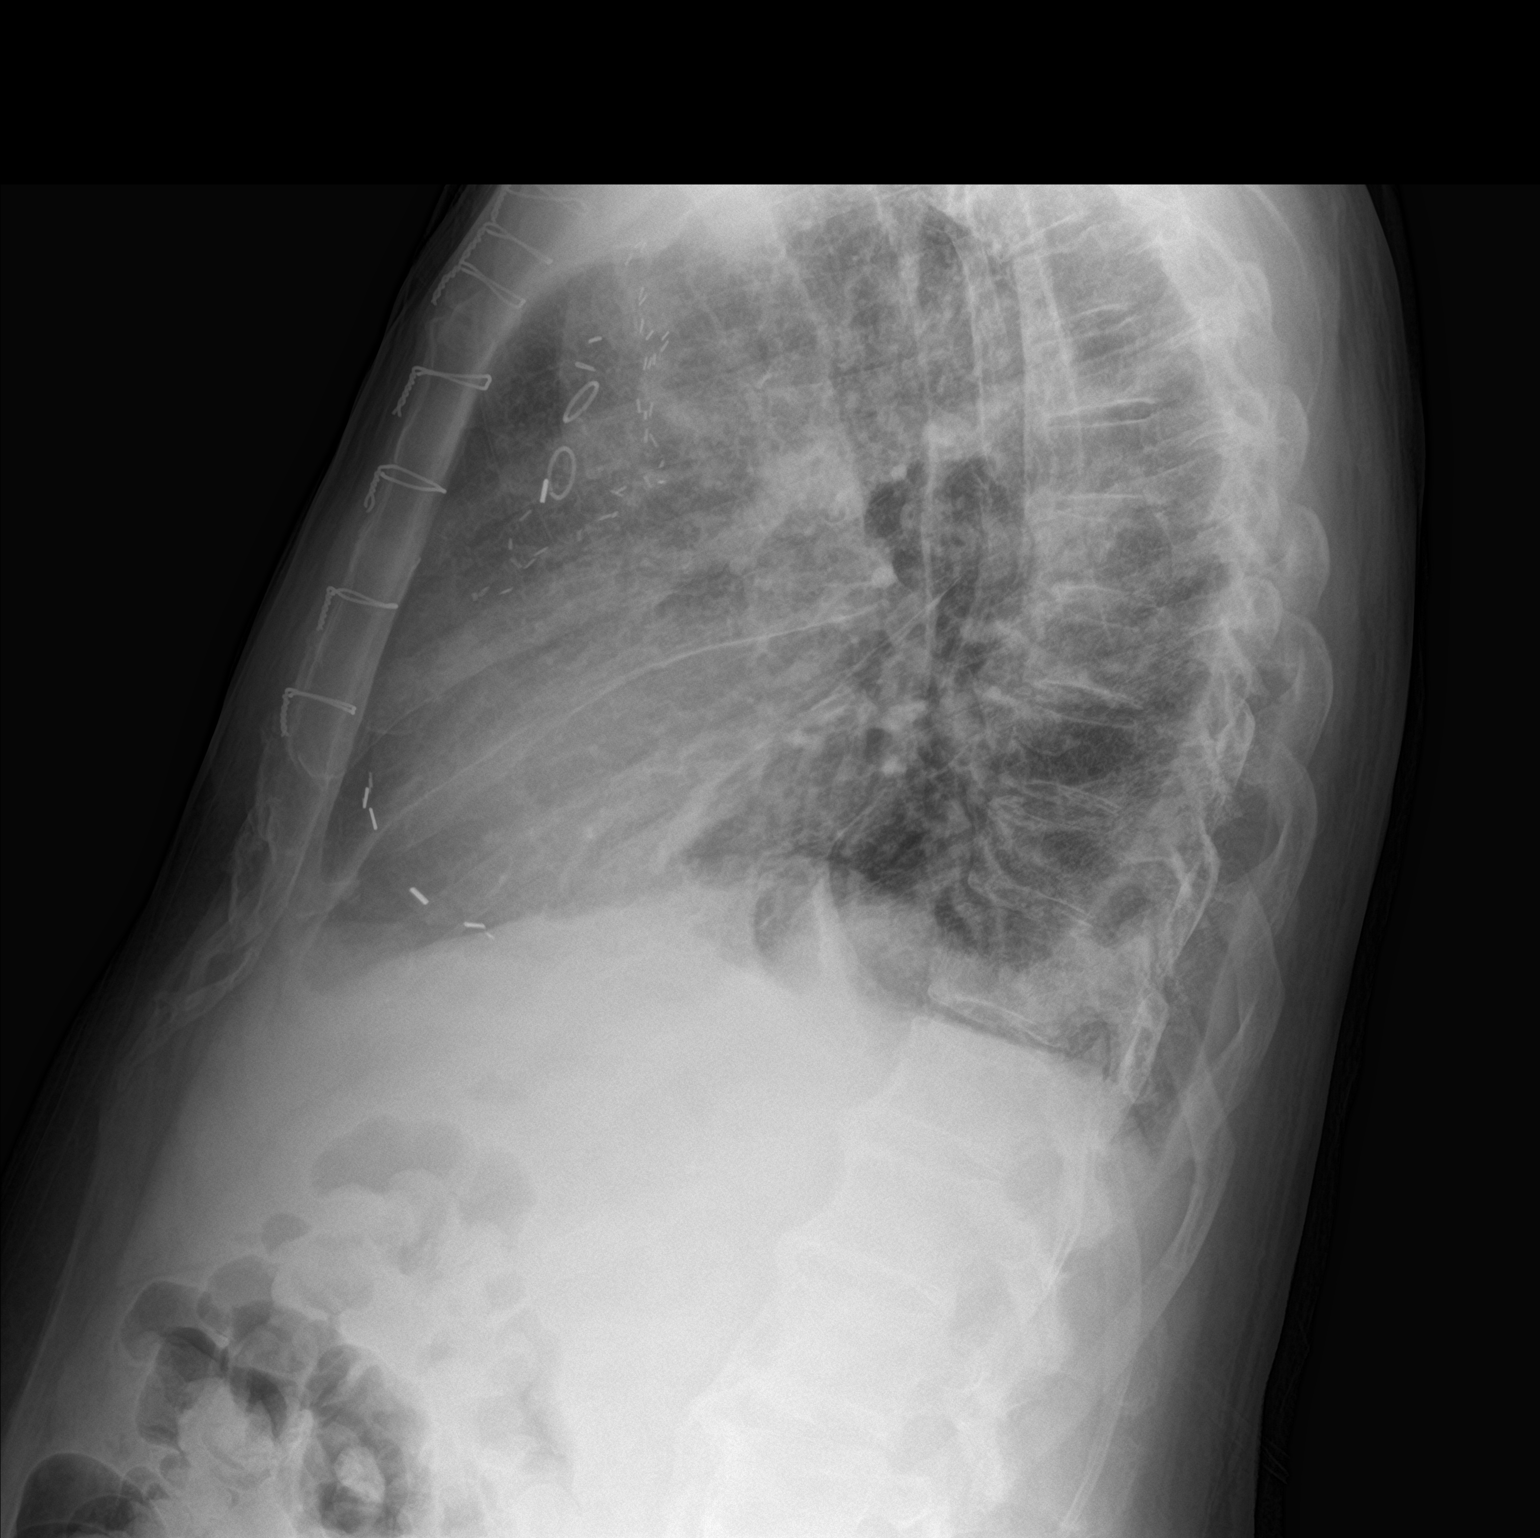

[2 of 2 positions shown; findings below may reference images not displayed]

FINDINGS: The cardio pericardial silhouette is enlarged. There is pulmonary
vascular congestion without overt pulmonary edema. Probable
underlying interstitial lung disease. Patient is status post CABG.
Possible tiny left pleural effusion.
IMPRESSION: Cardiomegaly with vascular congestion and probable tiny effusion.

## 2017-08-25 ENCOUNTER — Ambulatory Visit (HOSPITAL_BASED_OUTPATIENT_CLINIC_OR_DEPARTMENT_OTHER): Payer: Medicare Other | Attending: Pulmonary Disease | Admitting: Pulmonary Disease

## 2017-08-25 VITALS — Ht 69.0 in | Wt 194.0 lb

## 2017-08-25 DIAGNOSIS — I2510 Atherosclerotic heart disease of native coronary artery without angina pectoris: Secondary | ICD-10-CM | POA: Insufficient documentation

## 2017-08-25 DIAGNOSIS — I10 Essential (primary) hypertension: Secondary | ICD-10-CM | POA: Diagnosis not present

## 2017-08-25 DIAGNOSIS — E119 Type 2 diabetes mellitus without complications: Secondary | ICD-10-CM | POA: Insufficient documentation

## 2017-08-25 DIAGNOSIS — G4733 Obstructive sleep apnea (adult) (pediatric): Secondary | ICD-10-CM | POA: Diagnosis not present

## 2017-08-30 ENCOUNTER — Telehealth: Payer: Self-pay | Admitting: Pulmonary Disease

## 2017-08-30 DIAGNOSIS — G4733 Obstructive sleep apnea (adult) (pediatric): Secondary | ICD-10-CM

## 2017-08-30 DIAGNOSIS — C641 Malignant neoplasm of right kidney, except renal pelvis: Secondary | ICD-10-CM | POA: Diagnosis not present

## 2017-08-30 NOTE — Telephone Encounter (Signed)
A,  This showed he needed CPAP 10cm H20 with 2L O2 bleed in. Please order.  Thanks,  B  ------------  Spoke with pt, aware of cpap titration study results/recs.  Pt agreeable to O2 with cpap.  Order placed.  Nothing further needed.

## 2017-08-30 NOTE — Procedures (Signed)
Patient Name: [name], [name] Date: 08/25/2017 Gender: Male D.O.B: 12-21-1931 Age (years): 48 Referring Provider: Simonne Maffucci Height (inches): 69 Interpreting Physician: Kara Mead MD, ABSM Weight (lbs): 170 RPSGT: Jorge Ny BMI: 25 MRN: [id number] Neck Size: 16.50   CLINICAL INFORMATION The patient is referred for a CPAP titration to treat sleep apnea.  Date of NPSG: 06/21/17, AHI 8/h  SLEEP STUDY TECHNIQUE As per the AASM Manual for the Scoring of Sleep and Associated Events v2.3 (April 2016) with a hypopnea requiring 4% desaturations.  The channels recorded and monitored were frontal, central and occipital EEG, electrooculogram (EOG), submentalis EMG (chin), nasal and oral airflow, thoracic and abdominal wall motion, anterior tibialis EMG, snore microphone, electrocardiogram, and pulse oximetry. Continuous positive airway pressure (CPAP) was initiated at the beginning of the study and titrated to treat sleep-disordered breathing.  RESPIRATORY PARAMETERS Optimal PAP Pressure (cm): 10 AHI at Optimal Pressure (/hr): 6.3 Overall Minimal O2 (%): 86.00 Supine % at Optimal Pressure (%): 42 Minimal O2 at Optimal Pressure (%): 86.0     SLEEP ARCHITECTURE The study was initiated at 11:06:34 PM and ended at 5:39:45 AM.  Sleep onset time was 15.6 minutes and the sleep efficiency was 54.7%. The total sleep time was 215.0 minutes.  The patient spent 21.16% of the night in stage N1 sleep, 78.84% in stage N2 sleep, 0.00% in stage N3 and 0.00% in REM.Stage REM latency was N/A minutes  Wake after sleep onset was 162.6. Alpha intrusion was absent. Supine sleep was 53.71%.  CARDIAC DATA The 2 lead EKG demonstrated sinus rhythm. The mean heart rate was 47.02 beats per minute. Other EKG findings include: None.   LEG MOVEMENT DATA The total Periodic Limb Movements of Sleep (PLMS) were 0. The PLMS index was 0.00. A PLMS index of <15 is considered normal in  adults.  IMPRESSIONS - The optimal PAP pressure was 10 cm of water. - Central sleep apnea was not noted during this titration (CAI = 2.2/h). - Severe oxygen desaturations were observed during this titration (min O2 = 86.00%). 2L oxygen was blended in - The patient snored with Soft snoring volume during this titration study. - No cardiac abnormalities were observed during this study. - Clinically significant periodic limb movements were not noted during this study. Arousals associated with PLMs were rare.   DIAGNOSIS - Obstructive Sleep Apnea (327.23 [G47.33 ICD-10]) - Nocturnal hypoxia   RECOMMENDATIONS - Trial of CPAP therapy on 10 cm H2O with a Medium size Resmed Full Face Mask AirFit F20 mask and heated humidification. - 2L oxygen should be blended in - Avoid alcohol, sedatives and other CNS depressants that may worsen sleep apnea and disrupt normal sleep architecture. - Sleep hygiene should be reviewed to assess factors that may improve sleep quality. - Weight management and regular exercise should be initiated or continued. - Return to [facility] for re-evaluation after 4 weeks of therapy    Kara Mead MD Board Certified in [place]

## 2017-09-07 DIAGNOSIS — C641 Malignant neoplasm of right kidney, except renal pelvis: Secondary | ICD-10-CM | POA: Diagnosis not present

## 2017-09-10 IMAGING — CT CT CHEST HIGH RESOLUTION W/O CM
2 of 6 series · 14 of 36 positions shown, 17 images · non-contrast
Comparison: 03/26/2017 chest radiograph.

CLINICAL DATA: COPD.  Cough.  Worsening dyspnea.

EXAM:
CT CHEST WITHOUT CONTRAST
TECHNIQUE: Multidetector CT imaging of the chest was performed following the
standard protocol without intravenous contrast. High resolution
imaging of the lungs, as well as inspiratory and expiratory imaging,
was performed.

[Series 2: high resolution · axial · 0.70mm/px · z∈[-301,-27]mm · 11 of 155 slices shown, 14 images]
[im 9/155  mediastinal]
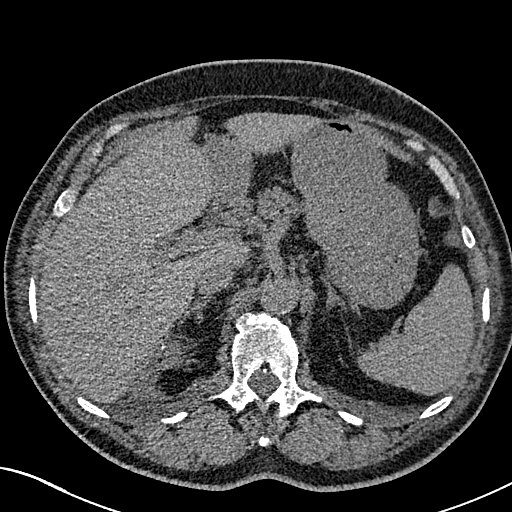
[im 9/155  lung]
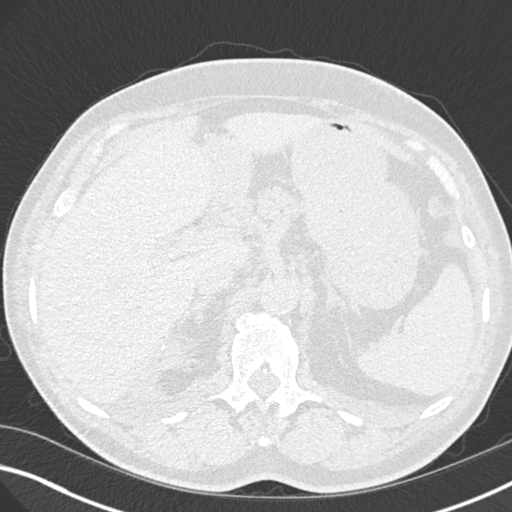
[im 25/155  lung]
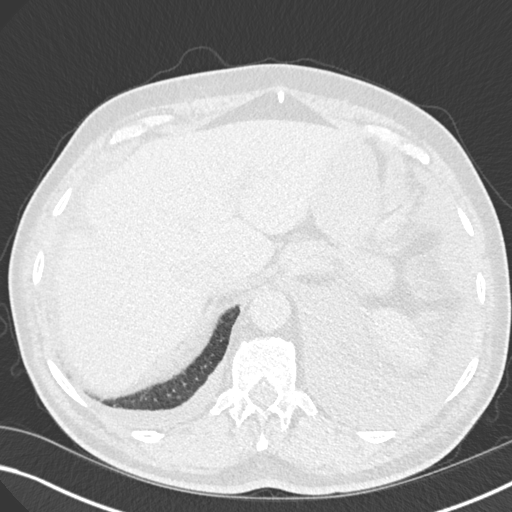
[im 41/155  lung]
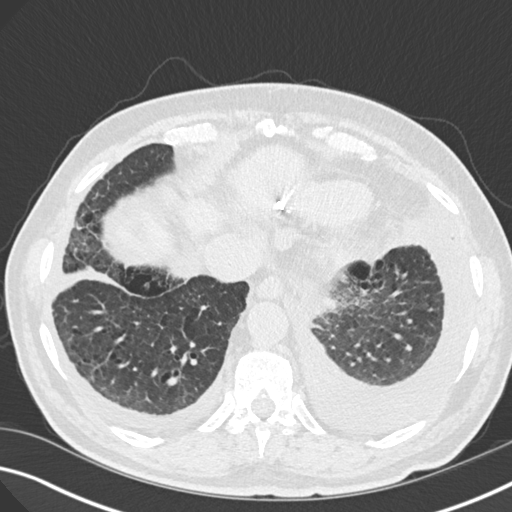
[im 49/155  lung]
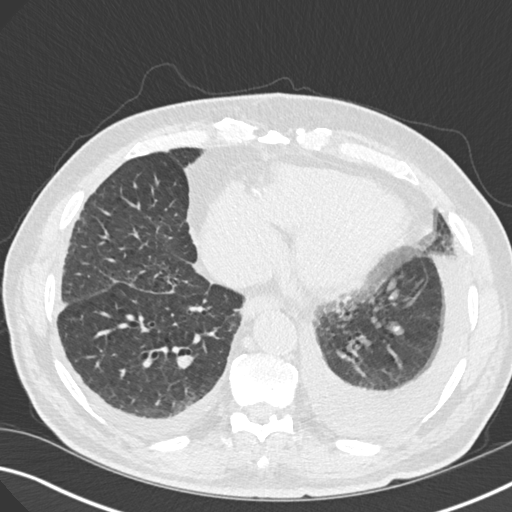
[im 65/155  mediastinal]
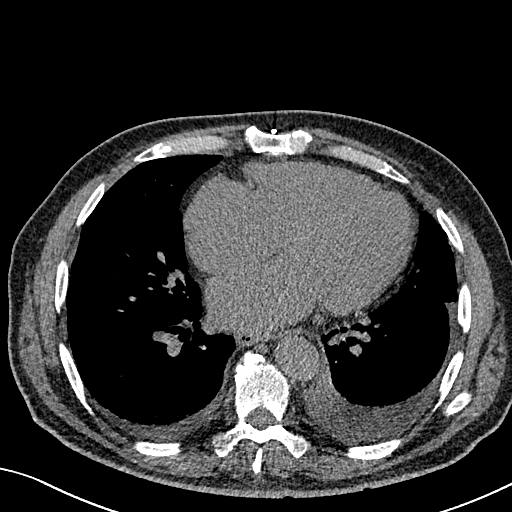
[im 65/155  lung]
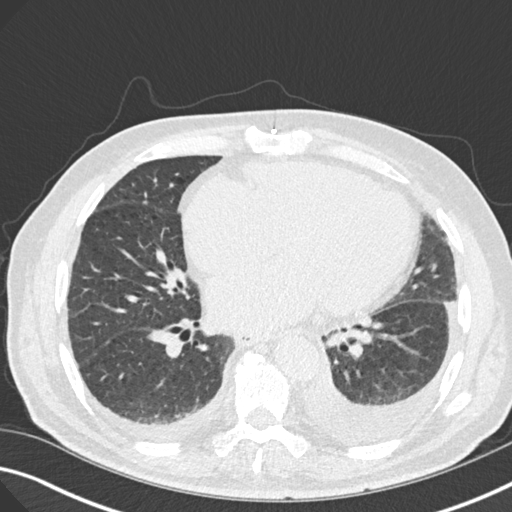
[im 82/155  lung]
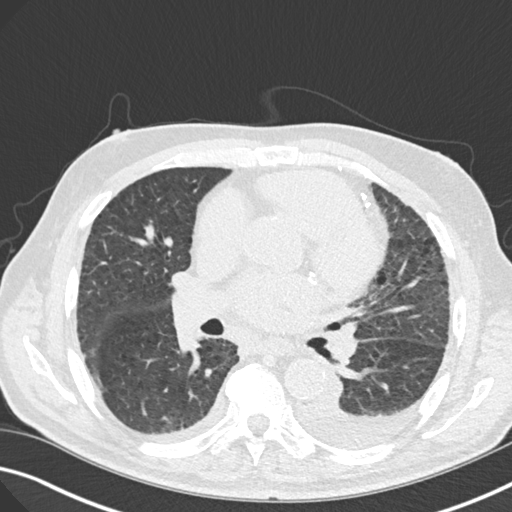
[im 90/155  lung]
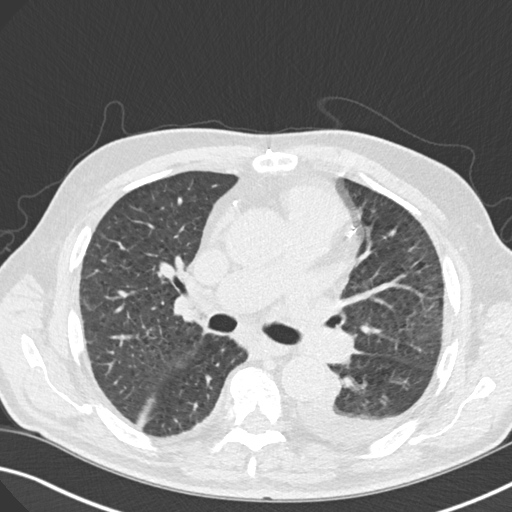
[im 106/155  lung]
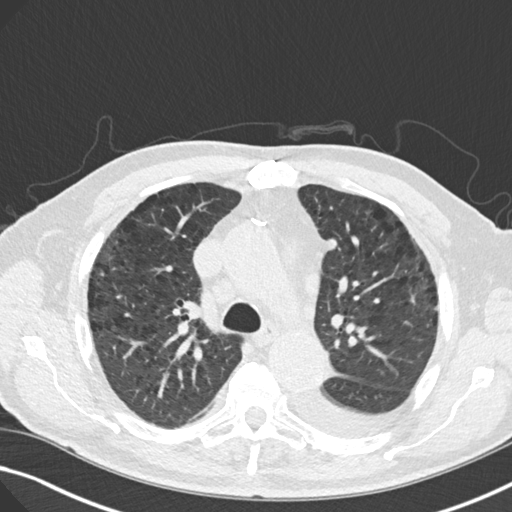
[im 114/155  mediastinal]
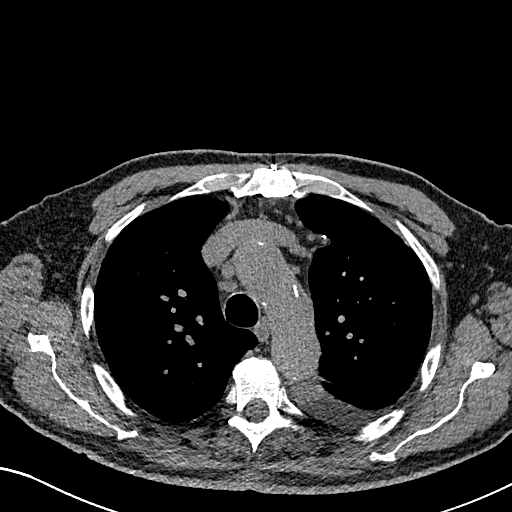
[im 114/155  lung]
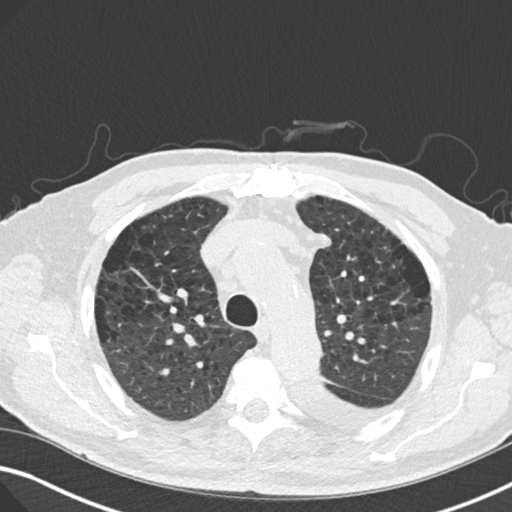
[im 130/155  lung]
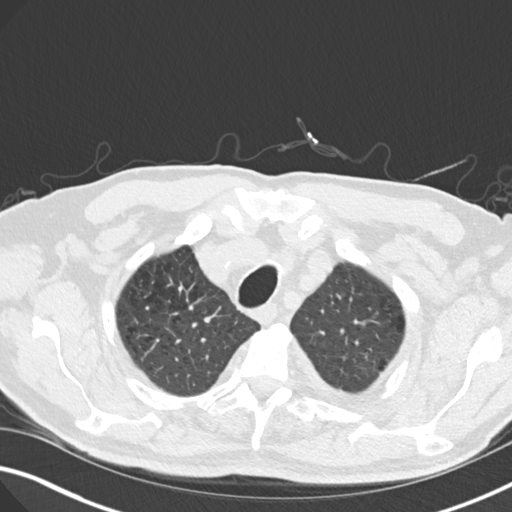
[im 146/155  lung]
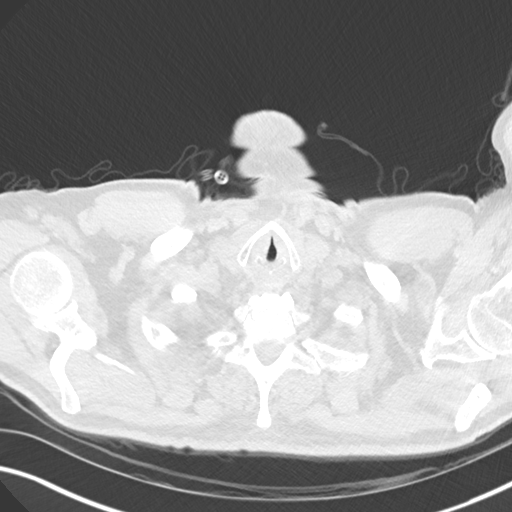

[Series 8: coronal · coronal · 0.62mm/px · 3 of 128 slices shown]
[im 26/128  lung]
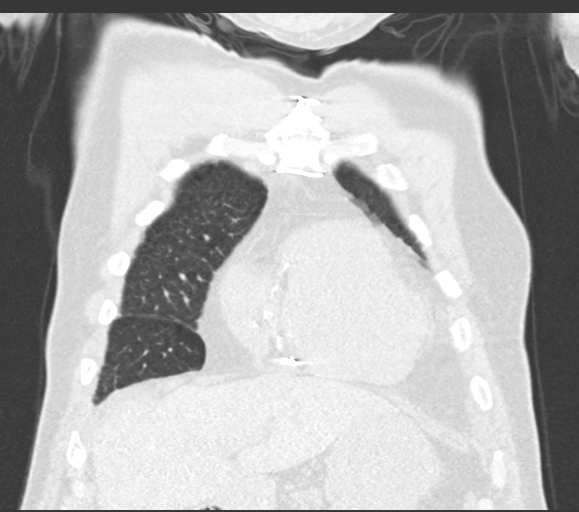
[im 51/128  lung]
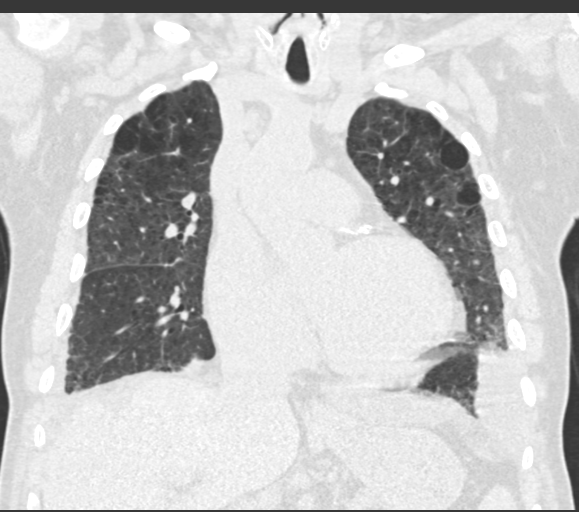
[im 77/128  lung]
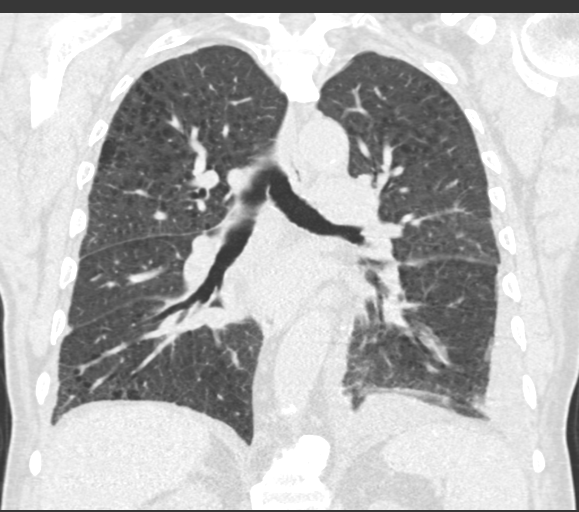

[14 of 36 positions shown; findings below may reference images not displayed]

FINDINGS: Partially motion degraded scan.

Cardiovascular: Mild cardiomegaly. No significant pericardial
fluid/thickening. Left anterior descending, left circumflex and
right coronary atherosclerosis status post CABG. Atherosclerotic
nonaneurysmal thoracic aorta. Top-normal caliber pulmonary arteries
(main pulmonary diameter 3.2 cm).

Mediastinum/Nodes: No discrete thyroid nodules. Unremarkable
esophagus. No axillary adenopathy. Mild right paratracheal
adenopathy measuring up to 1.4 cm (series 2/ image 53). Mildly
enlarged 1.4 cm subcarinal node (series 2/ image 71). No additional
pathologically enlarged mediastinal or gross hilar nodes on this
noncontrast scan.

Lungs/Pleura: No pneumothorax. Small bilateral dependent pleural
effusions, left greater than right. Moderate centrilobular emphysema
with mild diffuse bronchial wall thickening. Peripheral right upper
lobe 3 mm solid pulmonary nodule (series 3/image 49). No acute
consolidative airspace disease, lung masses or additional
significant pulmonary nodules. No significant lobular air trapping
on the expiration sequence. Mild diffuse interlobular septal
thickening in both lungs. Minimal compressive atelectasis in the
dependent lower lobes. No significant regions of subpleural
reticulation, traction bronchiectasis, architectural distortion,
parenchymal banding or frank honeycombing.

Upper abdomen: Perinephric fat stranding and deformity in the
posterior visualized upper right kidney.

Musculoskeletal: No aggressive appearing focal osseous lesions.
Intact sternotomy wires. Moderate thoracic spondylosis.
IMPRESSION: 1. Spectrum of findings suggestive of mild congestive heart failure,
including mild cardiomegaly, small bilateral pleural effusions (left
greater than right) and mild diffuse interlobular septal thickening
characteristic of mild pulmonary edema.
2. Moderate emphysema with mild diffuse bronchial wall thickening,
compatible with the provided history of COPD.
3. No convincing findings of interstitial lung disease at this time.
4. Mild mediastinal lymphadenopathy, nonspecific, probably reactive.
5. Solitary 3 mm right upper lobe solid pulmonary nodule. No
follow-up needed if patient is low-risk. Non-contrast chest CT can
be considered in 12 months if patient is high-risk. This
recommendation follows the consensus statement: Guidelines for
Management of Incidental Pulmonary Nodules Detected on CT Images:
6. Aortic atherosclerosis. Three-vessel coronary atherosclerosis
status post CABG.
7. Partially visualized postsurgical changes in the upper right
kidney.

## 2017-09-28 ENCOUNTER — Ambulatory Visit: Payer: Medicare Other | Admitting: Pulmonary Disease

## 2017-09-29 ENCOUNTER — Encounter: Payer: Self-pay | Admitting: Pulmonary Disease

## 2017-09-29 ENCOUNTER — Ambulatory Visit (INDEPENDENT_AMBULATORY_CARE_PROVIDER_SITE_OTHER): Payer: Medicare Other | Admitting: Pulmonary Disease

## 2017-09-29 VITALS — BP 140/80 | HR 60 | Ht 69.0 in | Wt 194.0 lb

## 2017-09-29 DIAGNOSIS — I2510 Atherosclerotic heart disease of native coronary artery without angina pectoris: Secondary | ICD-10-CM | POA: Diagnosis not present

## 2017-09-29 DIAGNOSIS — G4733 Obstructive sleep apnea (adult) (pediatric): Secondary | ICD-10-CM | POA: Diagnosis not present

## 2017-09-29 DIAGNOSIS — R911 Solitary pulmonary nodule: Secondary | ICD-10-CM | POA: Diagnosis not present

## 2017-09-29 DIAGNOSIS — J432 Centrilobular emphysema: Secondary | ICD-10-CM | POA: Diagnosis not present

## 2017-09-29 DIAGNOSIS — I272 Pulmonary hypertension, unspecified: Secondary | ICD-10-CM | POA: Diagnosis not present

## 2017-09-29 DIAGNOSIS — Z23 Encounter for immunization: Secondary | ICD-10-CM | POA: Diagnosis not present

## 2017-09-29 NOTE — Patient Instructions (Signed)
Pulmonary nodule: This is a small area in your lung which is solid at this time it appears to be benign but we need to get another CT scan in May 2019 sure it is not growing  Obstructive sleep apnea: This is the cause of your pulmonary hypertension, you need to keep using CPAP every night  Centrilobular emphysema: You have emphysema because you smoke cigarettes in the past.  We will give you a flu shot today to prevent infection in the wintertime.  At this time because your lung function test is normal and you have no shortness of breath I do not recommend any inhaled medicines  We will see you back in May 2019.

## 2017-09-29 NOTE — Progress Notes (Signed)
Subjective:    Patient ID: [name], male    DOB: [birth year], 83 y.o.   MRN: [id number]  Synopsis: Emphysema, obstructive sleep apnea, pulmonary hypertenion. Referred in April 2018 by Dr. Mali Hilty from cardiology for evaluation of pulmonary hypertension.  He used to smoke cigarettes, he notes that he smoked for 50 years, < 1 ppd.   He was found to have sleep apnea on a sleep study in 2018 and was started on CPAP.   HPI Chief Complaint  Patient presents with  . Follow-up    pt doing well on cpap.       He has been sleeping with CPAP fof 4-6 hours per night since starting it.  Some nights he sleps longer than this.  He feels good when he wakes up.  He still naps from time to time, this hasn't changed much.  He feels his energy level is okay..  No limitation in terms of shortness of breath right now.  No cough.   Past Medical History:  Diagnosis Date  . Chronic kidney disease    tumor right kidney  . Coronary artery disease   . Diabetes mellitus    type 2  . Hypertension   . Myocardial infarction ([place])    1997  . Right renal mass      Family History  Problem Relation Age of Onset  . Diabetes Mother   . Diabetes Sister         Review of Systems  Constitutional: Negative for fever and unexpected weight change.  HENT: Negative for congestion, dental problem, ear pain, nosebleeds, postnasal drip, rhinorrhea, sinus pressure, sneezing, sore throat and trouble swallowing.   Eyes: Negative for redness and itching.  Respiratory: Positive for shortness of breath. Negative for cough, chest tightness and wheezing.   Cardiovascular: Negative for palpitations and leg swelling.  Gastrointestinal: Negative for nausea and vomiting.  Genitourinary: Negative for dysuria.  Musculoskeletal: Negative for joint swelling.  Skin: Negative for rash.  Neurological: Negative for headaches.  Hematological: Does not bruise/bleed easily.  Psychiatric/Behavioral: Negative for dysphoric  mood. The patient is not nervous/anxious.        Objective:   Physical Exam  Vitals:   09/29/17 1120  BP: 140/80  Pulse: 60  SpO2: 92%  Weight: 194 lb (88 kg)  Height: 5\' 9"  (1.753 m)   2L   Gen: well appearing HENT: OP clear, TM's clear, neck supple PULM: CTA B, normal percussion CV: RRR, no mgr, trace edema GI: BS+, soft, nontender Derm: no cyanosis or rash Psyche: normal mood and affect    Pulmonary function test: April 2018 pulmonary function testing: Ratio normal, FEV1 2.18 L 85% predicted, 14% change post bronchodilator, forced vital capacity 2.63 L 74% predicted, total lung capacity 4.80 L 68% predicted, ERV 35% predicted, DLCO 8.4 mL 26% predicted  Cardiac imaging: January 2018 nuclear stress test showed an LVEF of 30-44%, low risk study with normal perfusion.  Echo: March 2018 echocardiogram showed an estimated PA pressure of 61 mmHg April 2018 echocardiogram showed an LVEF of 60-65% with grade 2 diastolic dysfunction, moderate concentric left ventricular hypertrophy, left atrium was moderately dilated, pulmonary artery systolic pressure estimate 85 mmHg, right ventricle cavity size normal with normal systolic function, there is a moderate sized pericardial effusion  Imaging: March 2018 chest x-ray images independently reviewed showing bilateral hazy airspace disease with cephalization, left-sided pleural effusion 2018 high-resolution CT scan of the chest shows upper lobe predominant moderate centrilobular emphysema, left-sided pleural  effusion, some interlobular septal thickening and based on lymphadenopathy consistent with congestive heart failure, 3 mm right lower lobe solitary pulmonary nodule, images and apparently reviewed, this is my interpretation  Sleep study: July 2018 polysomnogram: Mild obstructive sleep apnea, AHI 8.3, O2 saturation dropped to 84% on 2 L nasal cannula 08/2017 CPAP titration> 10cm H20, needed 2 L O2 blended in  Records from his 07/2017  cardiology visit reviewed where he was continued on his cholesterol and hypertensive regiment     Assessment & Plan:  OSA (obstructive sleep apnea)  Pulmonary hypertension (HCC)  Centrilobular emphysema (HCC)  Pulmonary nodule  Solitary pulmonary nodule  Discussion: Mr. Popelka was referred to me for evaluation of pulmonary hypertension earlier this year.  This is in the setting of centrilobular emphysema and previously untreated obstructive sleep apnea.  At this time he has no symptoms of shortness of breath despite his emphysema that so there is no indication for inhaled therapy.  He is compliant with CPAP right now.  He was counseled today on the importance of adherence to CPAP as it can help prevent adverse cardiovascular outcomes.  He has a small pulmonary nodule which is likely benign but considering his smoking history this needs to be followed.  Plan: Pulmonary nodule: This is a small area in your lung which is solid at this time it appears to be benign but we need to get another CT scan in May 2019 sure it is not growing  Obstructive sleep apnea: This is the cause of your pulmonary hypertension, you need to keep using CPAP every night  Centrilobular emphysema: You have emphysema because you smoke cigarettes in the past.  We will give you a flu shot today to prevent infection in the wintertime.  At this time because your lung function test is normal and you have no shortness of breath I do not recommend any inhaled medicines  We will see you back in May 2019.   Current Outpatient Prescriptions:  .  acetaminophen (TYLENOL) 325 MG tablet, Take 650 mg by mouth every 6 (six) hours as needed (for pain.)., Disp: , Rfl:  .  amLODipine (NORVASC) 5 MG tablet, Take 5 mg by mouth daily. , Disp: , Rfl: 0 .  aspirin 325 MG EC tablet, Take 325 mg by mouth daily., Disp: , Rfl:  .  benazepril (LOTENSIN) 40 MG tablet, Take 40 mg by mouth daily., Disp: , Rfl: 0 .  Choline Fenofibrate  (TRILIPIX) 45 MG capsule, Take 45 mg by mouth daily., Disp: , Rfl:  .  cloNIDine (CATAPRES) 0.1 MG tablet, Take 0.1 mg by mouth 2 (two) times daily., Disp: , Rfl:  .  ezetimibe (ZETIA) 10 MG tablet, Take 10 mg by mouth daily., Disp: , Rfl:  .  furosemide (LASIX) 40 MG tablet, Take 1 tablet (40 mg total) by mouth 2 (two) times daily., Disp: 60 tablet, Rfl: 5 .  HUMALOG KWIKPEN 100 UNIT/ML KiwkPen, Inject 10 Units into the skin daily with supper. , Disp: , Rfl: 0 .  LEVEMIR 100 UNIT/ML injection, Inject 30 Units into the skin 2 (two) times daily. , Disp: , Rfl: 11 .  nadolol (CORGARD) 40 MG tablet, Take 40 mg by mouth daily., Disp: , Rfl:  .  rosuvastatin (CRESTOR) 20 MG tablet, Take 20 mg by mouth daily., Disp: , Rfl:  .  SYNTHROID 100 MCG tablet, Take 100 mcg by mouth daily. , Disp: , Rfl: 0 .  tetrahydrozoline 0.05 % ophthalmic solution, Place 1-2 drops into  both eyes 3 (three) times daily as needed (for dry eyes)., Disp: , Rfl:  .  traMADol (ULTRAM) 50 MG tablet, Take 1-2 tablets (50-100 mg total) by mouth every 6 (six) hours as needed for moderate pain or severe pain., Disp: 30 tablet, Rfl: 0

## 2017-10-11 ENCOUNTER — Telehealth: Payer: Self-pay | Admitting: Pulmonary Disease

## 2017-10-11 NOTE — Telephone Encounter (Signed)
Called and spoke with pt and his wife and the pts wife stated that she just spoke with [facility] and they advised her that the pt would need an appt witih BQ after todays date.  appt scheduled for pt on Thursday 11-8 at 1045.

## 2017-10-11 NOTE — Telephone Encounter (Signed)
Spoke with Corene Cornea with [facility], who states 09/29/17 OV note shows benefit of cpap. Corene Cornea states he will send OV note to [employer].  Pt's spouse is aware and voiced her understanding.  Nothing further needed.

## 2017-10-14 ENCOUNTER — Ambulatory Visit (INDEPENDENT_AMBULATORY_CARE_PROVIDER_SITE_OTHER): Payer: Medicare Other | Admitting: Pulmonary Disease

## 2017-10-14 ENCOUNTER — Encounter: Payer: Self-pay | Admitting: Pulmonary Disease

## 2017-10-14 VITALS — BP 144/84 | HR 54 | Ht 69.0 in | Wt 201.0 lb

## 2017-10-14 DIAGNOSIS — I2510 Atherosclerotic heart disease of native coronary artery without angina pectoris: Secondary | ICD-10-CM

## 2017-10-14 DIAGNOSIS — G4733 Obstructive sleep apnea (adult) (pediatric): Secondary | ICD-10-CM

## 2017-10-14 NOTE — Progress Notes (Signed)
Subjective:    Patient ID: [name], male    DOB: 05/14/1932, 83 y.o.   MRN: [id number]  Synopsis: Emphysema, obstructive sleep apnea, pulmonary hypertenion. Referred in April 2018 by Dr. Mali Hilty from cardiology for evaluation of pulmonary hypertension.  He used to smoke cigarettes, he notes that he smoked for 50 years, < 1 ppd.   He was found to have sleep apnea on a sleep study in 2018 and was started on CPAP.   HPI Chief Complaint  Patient presents with  . Follow-up    needs CPAP approval/ appt with you before insurance approves CPAP   [name] was asked to come back to see [place] by his insurance company.  He continues to use and benefit from CPAP on a nightly basis.  He says that he does not have difficulty with sleepiness or feeling short of breath during the daytime.  He has some questions about his oxygen concentrator.   Past Medical History:  Diagnosis Date  . Chronic kidney disease    tumor right kidney  . Coronary artery disease   . Diabetes mellitus    type 2  . Hypertension   . Myocardial infarction ([place])    1997  . Right renal mass      Family History  Problem Relation Age of Onset  . Diabetes Mother   . Diabetes Sister         Review of Systems  Constitutional: Negative for fever and unexpected weight change.  HENT: Negative for congestion, dental problem, ear pain, nosebleeds, postnasal drip, rhinorrhea, sinus pressure, sneezing, sore throat and trouble swallowing.   Eyes: Negative for redness and itching.  Respiratory: Positive for shortness of breath. Negative for cough, chest tightness and wheezing.   Cardiovascular: Negative for palpitations and leg swelling.  Gastrointestinal: Negative for nausea and vomiting.  Genitourinary: Negative for dysuria.  Musculoskeletal: Negative for joint swelling.  Skin: Negative for rash.  Neurological: Negative for headaches.  Hematological: Does not bruise/bleed easily.  Psychiatric/Behavioral: Negative for  dysphoric mood. The patient is not nervous/anxious.        Objective:   Physical Exam  Vitals:   10/14/17 1050  BP: (!) 144/84  Pulse: (!) 54  SpO2: 92%  Weight: 201 lb (91.2 kg)  Height: 5\' 9"  (1.753 m)   2L   Gen: well appearing HENT: OP clear, TM's clear, neck supple PULM: CTA B, normal percussion CV: RRR, no mgr, trace edema GI: BS+, soft, nontender Derm: no cyanosis or rash Psyche: normal mood and affect   Pulmonary function test: April 2018 pulmonary function testing: Ratio normal, FEV1 2.18 L 85% predicted, 14% change post bronchodilator, forced vital capacity 2.63 L 74% predicted, total lung capacity 4.80 L 68% predicted, ERV 35% predicted, DLCO 8.4 mL 26% predicted  Cardiac imaging: January 2018 nuclear stress test showed an LVEF of 30-44%, low risk study with normal perfusion.  Echo: March 2018 echocardiogram showed an estimated PA pressure of 61 mmHg April 2018 echocardiogram showed an LVEF of 60-65% with grade 2 diastolic dysfunction, moderate concentric left ventricular hypertrophy, left atrium was moderately dilated, pulmonary artery systolic pressure estimate 85 mmHg, right ventricle cavity size normal with normal systolic function, there is a moderate sized pericardial effusion  Imaging: March 2018 chest x-ray images independently reviewed showing bilateral hazy airspace disease with cephalization, left-sided pleural effusion 2018 high-resolution CT scan of the chest shows upper lobe predominant moderate centrilobular emphysema, left-sided pleural effusion, some interlobular septal thickening and based  on lymphadenopathy consistent with congestive heart failure, 3 mm right lower lobe solitary pulmonary nodule, images and apparently reviewed, this is my interpretation  Sleep study: July 2018 polysomnogram: Mild obstructive sleep apnea, AHI 8.3, O2 saturation dropped to 84% on 2 L nasal cannula 08/2017 CPAP titration> 10cm H20, needed 2 L O2 blended  in  Records from his 07/2017 cardiology visit reviewed where he was continued on his cholesterol and hypertensive regiment     Assessment & Plan:  OSA (obstructive sleep apnea)  Plan: His pulmonary hypertension is due to his OSA.  He is compliant with CPAP therapy and it has been benefiting him.  Plan: Obstructive sleep apnea: He continue using CPAP nightly Keep your appointment in May 2019 as we decided on the last visit   Current Outpatient Medications:  .  acetaminophen (TYLENOL) 325 MG tablet, Take 650 mg by mouth every 6 (six) hours as needed (for pain.)., Disp: , Rfl:  .  amLODipine (NORVASC) 5 MG tablet, Take 5 mg by mouth daily. , Disp: , Rfl: 0 .  aspirin 325 MG EC tablet, Take 325 mg by mouth daily., Disp: , Rfl:  .  benazepril (LOTENSIN) 40 MG tablet, Take 40 mg by mouth daily., Disp: , Rfl: 0 .  Choline Fenofibrate (TRILIPIX) 45 MG capsule, Take 45 mg by mouth daily., Disp: , Rfl:  .  cloNIDine (CATAPRES) 0.1 MG tablet, Take 0.1 mg by mouth 2 (two) times daily., Disp: , Rfl:  .  ezetimibe (ZETIA) 10 MG tablet, Take 10 mg by mouth daily., Disp: , Rfl:  .  furosemide (LASIX) 40 MG tablet, Take 1 tablet (40 mg total) by mouth 2 (two) times daily., Disp: 60 tablet, Rfl: 5 .  HUMALOG KWIKPEN 100 UNIT/ML KiwkPen, Inject 10 Units into the skin daily with supper. , Disp: , Rfl: 0 .  LEVEMIR 100 UNIT/ML injection, Inject 30 Units into the skin 2 (two) times daily. , Disp: , Rfl: 11 .  nadolol (CORGARD) 40 MG tablet, Take 40 mg by mouth daily., Disp: , Rfl:  .  rosuvastatin (CRESTOR) 20 MG tablet, Take 20 mg by mouth daily., Disp: , Rfl:  .  SYNTHROID 100 MCG tablet, Take 100 mcg by mouth daily. , Disp: , Rfl: 0 .  tetrahydrozoline 0.05 % ophthalmic solution, Place 1-2 drops into both eyes 3 (three) times daily as needed (for dry eyes)., Disp: , Rfl:  .  traMADol (ULTRAM) 50 MG tablet, Take 1-2 tablets (50-100 mg total) by mouth every 6 (six) hours as needed for moderate pain or  severe pain., Disp: 30 tablet, Rfl: 0

## 2017-10-14 NOTE — Patient Instructions (Signed)
Obstructive sleep apnea: He continue using CPAP nightly Keep your appointment in May 2019 as we decided on the last visit

## 2017-10-25 ENCOUNTER — Other Ambulatory Visit: Payer: Self-pay | Admitting: Internal Medicine

## 2017-11-23 ENCOUNTER — Emergency Department (HOSPITAL_COMMUNITY)
Admission: EM | Admit: 2017-11-23 | Discharge: 2017-11-23 | Disposition: A | Discharge status: Home / Self Care | Payer: Medicare Other | Attending: Emergency Medicine | Admitting: Emergency Medicine

## 2017-11-23 ENCOUNTER — Other Ambulatory Visit: Payer: Self-pay

## 2017-11-23 ENCOUNTER — Emergency Department (HOSPITAL_COMMUNITY): Payer: Medicare Other

## 2017-11-23 ENCOUNTER — Encounter (HOSPITAL_COMMUNITY): Payer: Self-pay

## 2017-11-23 DIAGNOSIS — Z7982 Long term (current) use of aspirin: Secondary | ICD-10-CM | POA: Insufficient documentation

## 2017-11-23 DIAGNOSIS — N183 Chronic kidney disease, stage 3 (moderate): Secondary | ICD-10-CM | POA: Diagnosis not present

## 2017-11-23 DIAGNOSIS — I252 Old myocardial infarction: Secondary | ICD-10-CM | POA: Insufficient documentation

## 2017-11-23 DIAGNOSIS — Z951 Presence of aortocoronary bypass graft: Secondary | ICD-10-CM | POA: Insufficient documentation

## 2017-11-23 DIAGNOSIS — J449 Chronic obstructive pulmonary disease, unspecified: Secondary | ICD-10-CM | POA: Diagnosis not present

## 2017-11-23 DIAGNOSIS — J90 Pleural effusion, not elsewhere classified: Secondary | ICD-10-CM | POA: Diagnosis not present

## 2017-11-23 DIAGNOSIS — Z79899 Other long term (current) drug therapy: Secondary | ICD-10-CM | POA: Insufficient documentation

## 2017-11-23 DIAGNOSIS — E119 Type 2 diabetes mellitus without complications: Secondary | ICD-10-CM | POA: Insufficient documentation

## 2017-11-23 DIAGNOSIS — K5730 Diverticulosis of large intestine without perforation or abscess without bleeding: Secondary | ICD-10-CM | POA: Diagnosis not present

## 2017-11-23 DIAGNOSIS — R1084 Generalized abdominal pain: Secondary | ICD-10-CM | POA: Insufficient documentation

## 2017-11-23 DIAGNOSIS — Z794 Long term (current) use of insulin: Secondary | ICD-10-CM | POA: Diagnosis not present

## 2017-11-23 DIAGNOSIS — I130 Hypertensive heart and chronic kidney disease with heart failure and stage 1 through stage 4 chronic kidney disease, or unspecified chronic kidney disease: Secondary | ICD-10-CM | POA: Insufficient documentation

## 2017-11-23 DIAGNOSIS — I2510 Atherosclerotic heart disease of native coronary artery without angina pectoris: Secondary | ICD-10-CM | POA: Diagnosis not present

## 2017-11-23 DIAGNOSIS — I509 Heart failure, unspecified: Secondary | ICD-10-CM | POA: Insufficient documentation

## 2017-11-23 DIAGNOSIS — Z87891 Personal history of nicotine dependence: Secondary | ICD-10-CM | POA: Diagnosis not present

## 2017-11-23 DIAGNOSIS — R109 Unspecified abdominal pain: Secondary | ICD-10-CM | POA: Diagnosis not present

## 2017-11-23 LAB — URINALYSIS, ROUTINE W REFLEX MICROSCOPIC
Bacteria, UA: NONE SEEN
Bilirubin Urine: NEGATIVE
Glucose, UA: NEGATIVE mg/dL
KETONES UR: NEGATIVE mg/dL
Leukocytes, UA: NEGATIVE
Nitrite: NEGATIVE
PROTEIN: 100 mg/dL — AB
Specific Gravity, Urine: 1.01 (ref 1.005–1.030)
Squamous Epithelial / LPF: NONE SEEN
pH: 7 (ref 5.0–8.0)

## 2017-11-23 LAB — CBC WITH DIFFERENTIAL/PLATELET
BASOS ABS: 0 10*3/uL (ref 0.0–0.1)
Basophils Relative: 0 %
Eosinophils Absolute: 0.1 10*3/uL (ref 0.0–0.7)
Eosinophils Relative: 2 %
HEMATOCRIT: 43.2 % (ref 39.0–52.0)
Hemoglobin: 14.6 g/dL (ref 13.0–17.0)
LYMPHS ABS: 1.3 10*3/uL (ref 0.7–4.0)
LYMPHS PCT: 15 %
MCH: 27.5 pg (ref 26.0–34.0)
MCHC: 33.8 g/dL (ref 30.0–36.0)
MCV: 81.5 fL (ref 78.0–100.0)
Monocytes Absolute: 0.4 10*3/uL (ref 0.1–1.0)
Monocytes Relative: 4 %
NEUTROS ABS: 7.3 10*3/uL (ref 1.7–7.7)
Neutrophils Relative %: 79 %
Platelets: 228 10*3/uL (ref 150–400)
RBC: 5.3 MIL/uL (ref 4.22–5.81)
RDW: 14.9 % (ref 11.5–15.5)
WBC: 9.2 10*3/uL (ref 4.0–10.5)

## 2017-11-23 LAB — COMPREHENSIVE METABOLIC PANEL
ALK PHOS: 85 U/L (ref 38–126)
ALT: 15 U/L — AB (ref 17–63)
AST: 26 U/L (ref 15–41)
Albumin: 4 g/dL (ref 3.5–5.0)
Anion gap: 9 (ref 5–15)
BILIRUBIN TOTAL: 1.1 mg/dL (ref 0.3–1.2)
BUN: 18 mg/dL (ref 6–20)
CALCIUM: 9.2 mg/dL (ref 8.9–10.3)
CO2: 25 mmol/L (ref 22–32)
CREATININE: 2.03 mg/dL — AB (ref 0.61–1.24)
Chloride: 107 mmol/L (ref 101–111)
GFR calc Af Amer: 33 mL/min — ABNORMAL LOW (ref >60)
GFR, EST NON AFRICAN AMERICAN: 29 mL/min — AB (ref >60)
Glucose, Bld: 147 mg/dL — ABNORMAL HIGH (ref 65–99)
Potassium: 3.9 mmol/L (ref 3.5–5.1)
Sodium: 141 mmol/L (ref 135–145)
TOTAL PROTEIN: 7.6 g/dL (ref 6.5–8.1)

## 2017-11-23 LAB — TROPONIN I: Troponin I: <0.03 ng/mL (ref <0.03)

## 2017-11-23 LAB — BRAIN NATRIURETIC PEPTIDE: B NATRIURETIC PEPTIDE 5: 865.9 pg/mL — AB (ref 0.0–100.0)

## 2017-11-23 MED ORDER — TRAMADOL HCL 50 MG PO TABS: 50.0000 mg | ORAL_TABLET | Freq: Four times a day (QID) | ORAL | 0 refills | Status: DC | PRN

## 2017-11-23 MED ORDER — OXYCODONE-ACETAMINOPHEN 5-325 MG PO TABS: 1.0000 | ORAL_TABLET | ORAL | 0 refills | Status: DC | PRN

## 2017-11-23 MED ORDER — OXYCODONE-ACETAMINOPHEN 5-325 MG PO TABS: 1.0000 | ORAL_TABLET | Freq: Four times a day (QID) | ORAL | 0 refills | Status: DC | PRN

## 2017-11-23 MED ORDER — OXYCODONE-ACETAMINOPHEN 5-325 MG PO TABS
1.0000 | ORAL_TABLET | Freq: Once | ORAL | Status: AC
  Administered 2017-11-23: 1 via ORAL
  Filled 2017-11-23: qty 1

## 2017-11-23 MED ORDER — FUROSEMIDE 10 MG/ML IJ SOLN
40.0000 mg | Freq: Once | INTRAMUSCULAR | Status: AC
  Administered 2017-11-23: 40 mg via INTRAVENOUS
  Filled 2017-11-23: qty 4

## 2017-11-23 NOTE — ED Triage Notes (Addendum)
PT reports right side pain with any type of movement x several months. Pt states the pain has gotten worse over the last week. He reports hx of right partial nephrectomy

## 2017-11-23 NOTE — Discharge Instructions (Signed)
I think that your symptoms today are due to muscular pain.  I have prescribed you a pain medication to take as needed.  Make sure you get up and move around as often as possible.  Do not drive or drink alcohol while taking the pain medication.  Your lab work and x-ray also showed that you have a small amount of fluid on your lungs.  You were given a dose of IV Lasix today and you should call your cardiologist in the morning to discuss possible medication changes.  If you develop worsening shortness of breath, chest pain, or other symptoms, return to the ER.

## 2017-11-23 NOTE — ED Notes (Signed)
Pt. returned from XR. 

## 2017-11-23 NOTE — ED Notes (Signed)
Called pt's name, no answer.

## 2017-11-23 NOTE — ED Provider Notes (Signed)
[facility] EMERGENCY DEPARTMENT Provider Note   CSN: [id number] Arrival date & time: 11/23/17  0915     History   Chief Complaint No chief complaint on file.   HPI [name] is a 83 y.o. male.  HPI   83 yo M with PMHx CKD, CAD, DM, MI here with right side pain. Pt reports that for the past 2-3 weeks, he's had sharp, stabbing, right flank pain. It feels like it is on his abdominal wall and is worse with any movement, particularly twisting. He's not tried anything for it. It has been persistent but not significant worsened. No urinary sx. No CP. No cough or flank pain, no SOB. Denies any direct injury or trauma. He denies any other complaints. Pain improves when lying still.  Past Medical History:  Diagnosis Date  . Chronic kidney disease    tumor right kidney  . Coronary artery disease   . Diabetes mellitus    type 2  . Hypertension   . Myocardial infarction ([place])    1997  . Right renal mass     Patient Active Problem List   Diagnosis Date Noted  . COPD (chronic obstructive pulmonary disease) with emphysema ([place]) 04/29/2017  . Lung nodule 04/29/2017  . Hx of CABG 04/08/2017  . Chronic respiratory failure with hypoxia ([place]) 03/23/2017  . Pulmonary hypertension ([place]) 03/22/2017  . Shortness of breath 03/22/2017  . Pericardial effusion 03/22/2017  . CHF (congestive heart failure) ([place]) 02/25/2017  . H/O partial nephrectomy for renal mass 02/19/2017  . Acute postoperative respiratory failure ([place]) 02/19/2017  . Chronic cholecystitis with calculus s/p robotic cholecystectomy 02/19/2017 02/19/2017  . Acute kidney injury superimposed on chronic kidney disease ([place]) 11/18/2016  . Nausea vomiting and diarrhea 11/18/2016  . Renal mass s/p right partial nephrectomy 02/19/2017 11/18/2016  . Gastroenteritis 11/18/2016  . Diabetes ([place]) 11/18/2016  . Elevated transaminase level 11/18/2016  . Dyslipidemia 05/12/2013  . Syncope 01/14/2012  . Hypoglycemia  01/14/2012  . CKD (chronic kidney disease) stage 3 w/ baseline cr ~1.49 01/14/2012  . CAD (coronary artery disease), HTN and HLD 01/14/2012  . Essential hypertension 01/14/2012    Past Surgical History:  Procedure Laterality Date  . CARDIAC CATHETERIZATION  1996  . CORONARY ARTERY BYPASS GRAFT    . DOPPLER ECHOCARDIOGRAPHY  2011  . HERNIA REPAIR    . IR GENERIC HISTORICAL  12/09/2016   IR RADIOLOGIST EVAL & MGMT 12/09/2016 [name], MD [facility] INTERV RAD  . ROBOTIC ASSITED PARTIAL NEPHRECTOMY Right 02/19/2017   Procedure: XI ROBOTIC ASSITED PARTIAL NEPHRECTOMY;  Surgeon: [name], MD;  Location: [facility];  Service: Urology;  Laterality: Right;       Home Medications    Prior to Admission medications   Medication Sig Start Date End Date Taking? Authorizing Provider  acetaminophen (TYLENOL) 325 MG tablet Take 650 mg by mouth every 6 (six) hours as needed (for pain.).    [provider]  amLODipine (NORVASC) 5 MG tablet Take 5 mg by mouth daily.  10/04/15   [provider]  aspirin 325 MG EC tablet Take 325 mg by mouth daily.    [provider]  benazepril (LOTENSIN) 40 MG tablet Take 40 mg by mouth daily. 01/14/17   [provider]  Choline Fenofibrate (TRILIPIX) 45 MG capsule Take 45 mg by mouth daily.    [provider]  cloNIDine (CATAPRES) 0.1 MG tablet Take 0.1 mg by mouth 2 (two) times daily.  [provider]  ezetimibe (ZETIA) 10 MG tablet Take 10 mg by mouth daily.    [provider]  furosemide (LASIX) 40 MG tablet TAKE 1 TABLET(40 MG) BY MOUTH TWICE DAILY 10/25/17   Hilty, Nadean Corwin, MD  HUMALOG KWIKPEN 100 UNIT/ML KiwkPen Inject 10 Units into the skin daily with supper.  07/25/15   [provider]  LEVEMIR 100 UNIT/ML injection Inject 30 Units into the skin 2 (two) times daily.  10/17/15   [provider]  nadolol (CORGARD) 40 MG tablet Take 40 mg by mouth daily.    [provider]   oxyCODONE-acetaminophen (PERCOCET/ROXICET) 5-325 MG tablet Take 1 tablet by mouth every 6 (six) hours as needed for severe pain (DO NOT TAKE AT THE SAME TIME AS YOUR TRAMADOL; TAKE ONE OR THE OTHER). 11/23/17   Duffy Bruce, MD  rosuvastatin (CRESTOR) 20 MG tablet Take 20 mg by mouth daily.    [provider]  SYNTHROID 100 MCG tablet Take 100 mcg by mouth daily.  10/17/15   [provider]  tetrahydrozoline 0.05 % ophthalmic solution Place 1-2 drops into both eyes 3 (three) times daily as needed (for dry eyes).    [provider]  traMADol (ULTRAM) 50 MG tablet Take 1 tablet (50 mg total) by mouth every 6 (six) hours as needed for moderate pain. 11/23/17   Duffy Bruce, MD    Family History Family History  Problem Relation Age of Onset  . Diabetes Mother   . Diabetes Sister     Social History Social History   Tobacco Use  . Smoking status: Former Smoker    Packs/day: 0.30    Years: 45.00    Pack years: 13.50    Types: Cigarettes    Last attempt to quit: 02/15/1997    Years since quitting: 20.7  . Smokeless tobacco: Never Used  Substance Use Topics  . Alcohol use: No  . Drug use: No     Allergies   Patient has no known allergies.   Review of Systems Review of Systems  Genitourinary: Positive for flank pain.  Musculoskeletal: Positive for arthralgias.  All other systems reviewed and are negative.    Physical Exam Updated Vital Signs BP (!) 151/72   Pulse (!) 46   Temp 98.5 F (36.9 C) (Oral)   Resp 17   Ht 5\' 9"  (1.753 m)   Wt 88.9 kg (196 lb)   SpO2 95%   BMI 28.94 kg/m   Physical Exam  Constitutional: He is oriented to person, place, and time. He appears well-developed and well-nourished. No distress.  HENT:  Head: Normocephalic and atraumatic.  Eyes: Conjunctivae are normal.  Neck: Neck supple.  Cardiovascular: Normal rate, regular rhythm and normal heart sounds. Exam reveals no friction rub.  No murmur  heard. Pulmonary/Chest: Effort normal and breath sounds normal. No respiratory distress. He has no wheezes. He has no rales.  Abdominal: Soft. Bowel sounds are normal. He exhibits no distension. There is tenderness (TTp over right inferior rib margin and flank; no bruising; no skin lesions or deformity).  Musculoskeletal: He exhibits no edema.  Neurological: He is alert and oriented to person, place, and time. He exhibits normal muscle tone.  Skin: Skin is warm. Capillary refill takes less than 2 seconds.  No vesicular lesions  Psychiatric: He has a normal mood and affect.  Nursing note and vitals reviewed.    ED Treatments / Results  Labs (all labs ordered are listed, but only abnormal results  are displayed) Labs Reviewed  COMPREHENSIVE METABOLIC PANEL - Abnormal; Notable for the following components:      Result Value   Glucose, Bld 147 (*)    Creatinine, Ser 2.03 (*)    ALT 15 (*)    GFR calc non Af Amer 29 (*)    GFR calc Af Amer 33 (*)    All other components within normal limits  URINALYSIS, ROUTINE W REFLEX MICROSCOPIC - Abnormal; Notable for the following components:   Hgb urine dipstick SMALL (*)    Protein, ur 100 (*)    All other components within normal limits  BRAIN NATRIURETIC PEPTIDE - Abnormal; Notable for the following components:   B Natriuretic Peptide 865.9 (*)    All other components within normal limits  CBC WITH DIFFERENTIAL/PLATELET  TROPONIN I    EKG  EKG Interpretation  Date/Time:  Tuesday November 23 2017 12:54:07 EST Ventricular Rate:  50 PR Interval:    QRS Duration: 114 QT Interval:  511 QTC Calculation: 466 R Axis:   47 Text Interpretation:  Sinus rhythm Prolonged PR interval Borderline intraventricular conduction delay No significant change since last tracing Confirmed by Duffy Bruce [phone]) on 11/23/2017 1:12:04 PM       Radiology Ct Abdomen Pelvis Wo Contrast  Result Date: 11/23/2017 CLINICAL DATA:  PT reports right side pain  with any type of movement x several months. Pt states the pain has gotten worse over the last week. HX right partial nephrectomy, right renal mass, HTN, DM, CAD, CKD EXAM: CT ABDOMEN AND PELVIS WITHOUT CONTRAST TECHNIQUE: Multidetector CT imaging of the abdomen and pelvis was performed following the standard protocol without IV contrast. COMPARISON:  CT 08/30/2017 FINDINGS: Lower chest: Moderate chronic LEFT. Hepatobiliary: No focal hepatic lesion. No biliary duct dilatation. Gallbladder is normal. Common bile duct is normal. Pancreas: Pancreas is normal. No ductal dilatation. No pancreatic inflammation. Spleen: Normal spleen Adrenals/urinary tract: Adrenal glands normal. No nephrolithiasis or ureterolithiasis. Several simple fluid lesions of the LEFT kidney are round likely represent benign cysts. RIGHT renal lesion identified. Bladder normal Stomach/Bowel: Stomach, small bowel, appendix, and cecum are normal. Diverticulosis sigmoid colon without acute inflammation. Vascular/Lymphatic: Abdominal aorta is normal caliber with atherosclerotic calcification. There is no retroperitoneal or periportal lymphadenopathy. No pelvic lymphadenopathy. Reproductive: Prostate normal Other: Small bilateral fat filled inguinal hernias Musculoskeletal: No aggressive osseous lesion. Bulky osteophytes the spine. IMPRESSION: 1. Chronic LEFT effusion. 2. Normal appendix. 3. No ureterolithiasis or obstructive uropathy. 4. No worrisome renal lesion on noncontrast exam. 5. Mild LEFT colon diverticulosis.  No diverticulitis. 6. Bilateral fat filled inguinal. Electronically Signed   By: Suzy Bouchard M.D.   On: 11/23/2017 11:17   Dg Chest 2 View  Result Date: 11/23/2017 CLINICAL DATA:  Right flank pain EXAM: CHEST  2 VIEW COMPARISON:  03/26/2017 FINDINGS: CABG. Cardiac enlargement with vascular congestion. Small bilateral pleural effusion. Left lower lobe atelectasis. IMPRESSION: Pulmonary vascular congestion with small pleural  effusions suggesting mild fluid overload. Negative for edema. Mild left lower lobe atelectasis. Electronically Signed   By: Franchot Gallo M.D.   On: 11/23/2017 11:26    Procedures Procedures (including critical care time)  Medications Ordered in ED Medications  oxyCODONE-acetaminophen (PERCOCET/ROXICET) 5-325 MG per tablet 1 tablet (1 tablet Oral Given 11/23/17 1057)  furosemide (LASIX) injection 40 mg (40 mg Intravenous Given 11/23/17 1256)     Initial Impression / Assessment and Plan / ED Course  I have reviewed the triage vital signs and the nursing notes.  Pertinent  labs & imaging results that were available during my care of the patient were reviewed by me and considered in my medical decision making (see chart for details).     83 year old male here with positional right sided chest pain.  I suspect this is likely musculoskeletal in etiology.  His pain is reproducible and worse with twisting and resolves when still.  Nonetheless, given his complex history, screening lab work was obtained and is unremarkable.  He does incidentally have mild pulmonary edema.  He is satting well on his baseline O2.  He has no increased work of breathing.  He has no chest pain.  BNP is mildly elevated but is below his previous values.  He was given a dose of IV Lasix here and can follow-up with his cardiologist for this.  Otherwise, imaging shows no acute abnormality.  His CT of the abdomen shows no evidence of renal stone, bony or other abnormality.  His pain is not consistent with dissection.  Will discharge with supportive care.  Final Clinical Impressions(s) / ED Diagnoses   Final diagnoses:  Flank pain    ED Discharge Orders        Ordered    oxyCODONE-acetaminophen (PERCOCET/ROXICET) 5-325 MG tablet  Every 4 hours PRN,   Status:  Discontinued     11/23/17 1325    oxyCODONE-acetaminophen (PERCOCET/ROXICET) 5-325 MG tablet  Every 6 hours PRN     11/23/17 1328    traMADol (ULTRAM) 50 MG tablet   Every 6 hours PRN     11/23/17 1328       Duffy Bruce, MD 11/23/17 1707

## 2017-11-23 NOTE — ED Notes (Signed)
Pt reports that he had an area taken off of his kidney summer 2018

## 2017-11-23 NOTE — ED Notes (Signed)
Wife at bedside reports pt does not always wear his home O2 but is supposed to be on 2L [place]. This writer placed pt on 2L [place] for O2 sat in lower 90s, pt now at 94% 2L [place].

## 2017-12-30 DIAGNOSIS — I10 Essential (primary) hypertension: Secondary | ICD-10-CM | POA: Diagnosis not present

## 2017-12-30 DIAGNOSIS — E118 Type 2 diabetes mellitus with unspecified complications: Secondary | ICD-10-CM | POA: Diagnosis not present

## 2018-01-06 DIAGNOSIS — N183 Chronic kidney disease, stage 3 (moderate): Secondary | ICD-10-CM | POA: Diagnosis not present

## 2018-01-06 DIAGNOSIS — E782 Mixed hyperlipidemia: Secondary | ICD-10-CM | POA: Diagnosis not present

## 2018-01-06 DIAGNOSIS — I10 Essential (primary) hypertension: Secondary | ICD-10-CM | POA: Diagnosis not present

## 2018-01-06 DIAGNOSIS — E039 Hypothyroidism, unspecified: Secondary | ICD-10-CM | POA: Diagnosis not present

## 2018-01-06 DIAGNOSIS — E118 Type 2 diabetes mellitus with unspecified complications: Secondary | ICD-10-CM | POA: Diagnosis not present

## 2018-01-12 DIAGNOSIS — I10 Essential (primary) hypertension: Secondary | ICD-10-CM | POA: Diagnosis not present

## 2018-01-12 DIAGNOSIS — E039 Hypothyroidism, unspecified: Secondary | ICD-10-CM | POA: Diagnosis not present

## 2018-01-12 DIAGNOSIS — E789 Disorder of lipoprotein metabolism, unspecified: Secondary | ICD-10-CM | POA: Diagnosis not present

## 2018-01-12 DIAGNOSIS — E118 Type 2 diabetes mellitus with unspecified complications: Secondary | ICD-10-CM | POA: Diagnosis not present

## 2018-01-12 DIAGNOSIS — E1165 Type 2 diabetes mellitus with hyperglycemia: Secondary | ICD-10-CM | POA: Diagnosis not present

## 2018-01-20 DIAGNOSIS — E118 Type 2 diabetes mellitus with unspecified complications: Secondary | ICD-10-CM | POA: Diagnosis not present

## 2018-01-20 DIAGNOSIS — E039 Hypothyroidism, unspecified: Secondary | ICD-10-CM | POA: Diagnosis not present

## 2018-01-26 ENCOUNTER — Other Ambulatory Visit: Payer: Self-pay | Admitting: Internal Medicine

## 2018-01-26 DIAGNOSIS — E118 Type 2 diabetes mellitus with unspecified complications: Secondary | ICD-10-CM | POA: Diagnosis not present

## 2018-01-26 DIAGNOSIS — I10 Essential (primary) hypertension: Secondary | ICD-10-CM | POA: Diagnosis not present

## 2018-01-26 DIAGNOSIS — E1165 Type 2 diabetes mellitus with hyperglycemia: Secondary | ICD-10-CM | POA: Diagnosis not present

## 2018-01-26 DIAGNOSIS — E789 Disorder of lipoprotein metabolism, unspecified: Secondary | ICD-10-CM | POA: Diagnosis not present

## 2018-01-26 DIAGNOSIS — E039 Hypothyroidism, unspecified: Secondary | ICD-10-CM | POA: Diagnosis not present

## 2018-01-26 NOTE — Telephone Encounter (Signed)
REFILL 

## 2018-02-10 ENCOUNTER — Ambulatory Visit (INDEPENDENT_AMBULATORY_CARE_PROVIDER_SITE_OTHER): Payer: Medicare Other | Admitting: Internal Medicine

## 2018-02-10 ENCOUNTER — Encounter: Payer: Self-pay | Admitting: Internal Medicine

## 2018-02-10 VITALS — BP 148/80 | HR 53 | Ht 69.0 in | Wt 198.0 lb

## 2018-02-10 DIAGNOSIS — N183 Chronic kidney disease, stage 3 unspecified: Secondary | ICD-10-CM

## 2018-02-10 DIAGNOSIS — I10 Essential (primary) hypertension: Secondary | ICD-10-CM

## 2018-02-10 DIAGNOSIS — Z951 Presence of aortocoronary bypass graft: Secondary | ICD-10-CM | POA: Diagnosis not present

## 2018-02-10 DIAGNOSIS — I2510 Atherosclerotic heart disease of native coronary artery without angina pectoris: Secondary | ICD-10-CM

## 2018-02-10 NOTE — Patient Instructions (Addendum)
Continue same medications.   Your physician wants you to follow-up in: 6 months.  You will receive a reminder letter in the mail two months in advance. If you don't receive a letter, please call our office to schedule the follow-up appointment.  

## 2018-02-10 NOTE — Progress Notes (Signed)
OFFICE NOTE  Chief Complaint:  No complaints  Primary Care Physician: [name], MD  HPI:  [name] is a pleasant gentleman who I previously saw in 2014. He has a history of coronary artery disease status post CABG in 1996. He also has chronic kidney disease with a creatinine that was around 1.8. In the past he seen Dr. Moshe Cipro, however she no longer sees him. He denies any chest pain or worsening shortness of breath. He reports good blood pressure control on his current medicines. He is somewhat active but does not exercise regularly. His primary care provider has been supplying his medications however we have prescribed him in the past for nadolol. He was noted in the past to have a right bundle branch block was which was new. His last stress test was in 2012 which was negative for ischemia.  03/22/2017  Mr. [name] returns today for hospital follow-up. I have not seen him since 2016. He has a history of coronary artery disease status post CABG in 1996 as well as chronic kidney disease is had a creatinine is elevated is 1.8 in the past but apparently that improved. He was previously seen Dr. Moshe Cipro. He does have a history of chronic cholecystitis and is part of her workup for that was found to have renal cell cancer. In March she was admitted for surgery and underwent robotic-assisted right partial nephrectomy for a renal mass and combined cholecystectomy. He had difficulty in oxygenation after surgery and was monitored in the ICU for hypoxia. It was felt that there was an element of diastolic congestive heart failure. An echocardiogram was performed on 02/20/2017 which showed an EF of 03-54%, grade 2 diastolic dysfunction and moderate pulmonary hypertension with an RVSP of 61 mmHg. He underwent a subsequent limited echo on 03/09/2017 which showed an EF of 65-68%, grade 2 diastolic function, however there was an increase in RVSP to 85 mmHg suggestive of severe pulmonary hypertension  and there was a moderate localized pericardial effusion around the inferior and inferolateral walls without signs of tamponade physiology. [name] is had persistent shortness of breath and hypoxia. He was sent home on oxygen and recently noted in his urologist office to be hypoxic with an O2 sat duration of 87% on room air.  04/08/2017  [name] was seen today in follow-up. Since I last saw him and put him on diuretics his weight has come down from 204 pounds 195 pounds. He reports improvement in his breathing. We repeated an echocardiogram which shows a stable LV EF 55-60% and resolution of pericardial effusion. Of note he is RVSP is decreased to 46 mmHg, and previously was 61 mmHg. This suggests he may have had some pulmonary venous hypertension. He was also placed on oxygen after seeing Dr. Lake Bells in Pulmonary. A VQ scan was negative for pulmonary emboli.   07/26/2017  [name] returns for follow-up. He is doing exceedingly well. His previous dimension his EF has normalized. He's maintained a stable weight on twice daily Lasix. He's followed up with Dr. Lake Bells who has diagnosed him with obstructive sleep apnea. He has an upcoming CPAP titration. He is on oxygen, mostly at night and with exertion. In general he feels much better. Creatinine has also improved. Recent labs from his primary care provider were also all within normal limits.  02/10/2018  [name] was seen today in follow-up.  Overall he is without complaints.  He is on oxygen chronically.  He denies any chest pain  or worsening shortness of breath.  He had recent lab work in December 2018 which showed a serum creatinine at 2.03.  His creatinine had been much higher in the past.  A lipid profile last in July 2018 showed total cholesterol 112, HDL 35, LDL 62 and triglycerides 77.  Hemoglobin A1c was increased up to 8.4.  As previously followed by Dr. Wilson Singer, but after his retirement has been seeing Dr. Maudie Mercury.  His insulin regimen was recently  switched, including changing him over to Lantus insulin 35 units at night.  PMHx:  Past Medical History:  Diagnosis Date  . Chronic kidney disease    tumor right kidney  . Coronary artery disease   . Diabetes mellitus    type 2  . Hypertension   . Myocardial infarction ([place])    1997  . Right renal mass     Past Surgical History:  Procedure Laterality Date  . CARDIAC CATHETERIZATION  1996  . CORONARY ARTERY BYPASS GRAFT    . DOPPLER ECHOCARDIOGRAPHY  2011  . HERNIA REPAIR    . IR GENERIC HISTORICAL  12/09/2016   IR RADIOLOGIST EVAL & MGMT 12/09/2016 [name], MD [facility] INTERV RAD  . ROBOTIC ASSITED PARTIAL NEPHRECTOMY Right 02/19/2017   Procedure: XI ROBOTIC ASSITED PARTIAL NEPHRECTOMY;  Surgeon: [name], MD;  Location: [facility];  Service: Urology;  Laterality: Right;    FAMHx:  Family History  Problem Relation Age of Onset  . Diabetes Mother   . Diabetes Sister     SOCHx:   reports that he quit smoking about 21 years ago. His smoking use included cigarettes. He has a 13.50 pack-year smoking history. he has never used smokeless tobacco. He reports that he does not drink alcohol or use drugs.  ALLERGIES:  No Known Allergies  ROS: Pertinent items noted in HPI and remainder of comprehensive ROS otherwise negative.  HOME MEDS: Current Outpatient Medications  Medication Sig Dispense Refill  . acetaminophen (TYLENOL) 325 MG tablet Take 650 mg by mouth every 6 (six) hours as needed (for pain.).    Marland Kitchen amLODipine (NORVASC) 5 MG tablet Take 5 mg by mouth daily.   0  . aspirin 325 MG EC tablet Take 325 mg by mouth daily.    . benazepril (LOTENSIN) 40 MG tablet Take 40 mg by mouth daily.  0  . Choline Fenofibrate (TRILIPIX) 45 MG capsule Take 45 mg by mouth daily.    . cloNIDine (CATAPRES) 0.1 MG tablet Take 0.1 mg by mouth 2 (two) times daily.    Marland Kitchen ezetimibe (ZETIA) 10 MG tablet Take 10 mg by mouth daily.    . furosemide (LASIX) 40 MG tablet TAKE 1 TABLET(40 MG) BY  MOUTH TWICE DAILY 60 tablet 0  . HUMALOG KWIKPEN 100 UNIT/ML KiwkPen Inject 10 Units into the skin daily with supper.   0  . LEVEMIR 100 UNIT/ML injection Inject 30 Units into the skin 2 (two) times daily.   11  . nadolol (CORGARD) 40 MG tablet Take 40 mg by mouth daily.    Marland Kitchen oxyCODONE-acetaminophen (PERCOCET/ROXICET) 5-325 MG tablet Take 1 tablet by mouth every 6 (six) hours as needed for severe pain (DO NOT TAKE AT THE SAME TIME AS YOUR TRAMADOL; TAKE ONE OR THE OTHER). 10 tablet 0  . rosuvastatin (CRESTOR) 20 MG tablet Take 20 mg by mouth daily.    Marland Kitchen SYNTHROID 100 MCG tablet Take 100 mcg by mouth daily.   0  . tetrahydrozoline 0.05 % ophthalmic solution Place  1-2 drops into both eyes 3 (three) times daily as needed (for dry eyes).    . traMADol (ULTRAM) 50 MG tablet Take 1 tablet (50 mg total) by mouth every 6 (six) hours as needed for moderate pain. 15 tablet 0   No current facility-administered medications for this visit.     LABS/IMAGING: No results found for this or any previous visit (from the past 48 hour(s)). No results found.  WEIGHTS: Wt Readings from Last 3 Encounters:  02/10/18 198 lb (89.8 kg)  11/23/17 196 lb (88.9 kg)  10/14/17 201 lb (91.2 kg)    VITALS: BP (!) 148/80   Pulse (!) 53   Ht 5\' 9"  (1.753 m)   Wt 198 lb (89.8 kg)   BMI 29.24 kg/m   EXAM: General appearance: alert, no distress and on oxygen Neck: no carotid bruit and no JVD Lungs: clear to auscultation bilaterally Heart: regular rate and rhythm, S1, S2 normal, no murmur, click, rub or gallop Abdomen: soft, non-tender; bowel sounds normal; no masses,  no organomegaly Extremities: extremities normal, atraumatic, no cyanosis or edema Pulses: 2+ and symmetric Skin: Skin color, texture, turgor normal. No rashes or lesions Neurologic: Grossly normal Psych: Normal, but did not recall seeing a pulmonologist  EKG: Sinus bradycardia 53, ST and T wave abnormalities-personally reviewed  ASSESSMENT:   1. Acute hypoxic respiratory failure - likely acute diastolic CHF, improved (EF 55-60%, 03/2017) 2. Mixed pulmonary venous/arterial hypertension 3. Moderate sized pericardial effusion - resolved 4. CAD status post CABG 1996 5. CKD with improving creatinine now 1.54  (as high as 3.84, 2.87, 2.36 recently) 6. Hypertension 7. Dyslipidemia 8. RBBB 9. OSA on CPAP  PLAN: 1.    Mr. Balistreri seems stable on his current dose of diuretics.  His weight is been stable here.  His creatinine has hovered around 2.  He is on chronic oxygen therapy and followed by pulmonary.  He has a stable right bundle branch block.  Blood pressure has been well controlled.  His A1c is elevated 8.4, but recently had a change in his diabetes medications.  Given his history of CABG and diabetes, he is a good candidate to be on J[place].  I would ask for his primary care provider to consider placing him on this.  There is clearly evidence for cardiovascular mortality benefit independent of blood sugar improvement however he would benefit from that as well as a small amount of weight loss.  We will have to watch him closely with his chronic kidney disease.  Follow-up in 6 months.  Pixie Casino, MD, [facility], [place] Director of the [facility] Lipid [facility]  Cardiovascular [facility] Diplomate of the American Board of Clinical Lipidology Attending Cardiologist  Direct Dial: [phone]  Fax: [phone]  Website:  [email or web address] [email or web address] Latka 02/10/2018, 10:24 AM

## 2018-02-28 ENCOUNTER — Other Ambulatory Visit: Payer: Self-pay | Admitting: Internal Medicine

## 2018-03-04 DIAGNOSIS — C641 Malignant neoplasm of right kidney, except renal pelvis: Secondary | ICD-10-CM | POA: Diagnosis not present

## 2018-03-04 DIAGNOSIS — J90 Pleural effusion, not elsewhere classified: Secondary | ICD-10-CM | POA: Diagnosis not present

## 2018-03-10 DIAGNOSIS — C641 Malignant neoplasm of right kidney, except renal pelvis: Secondary | ICD-10-CM | POA: Diagnosis not present

## 2018-03-10 DIAGNOSIS — C642 Malignant neoplasm of left kidney, except renal pelvis: Secondary | ICD-10-CM | POA: Diagnosis not present

## 2018-03-21 ENCOUNTER — Ambulatory Visit: Payer: Medicare Other | Admitting: Pulmonary Disease

## 2018-03-21 DIAGNOSIS — E118 Type 2 diabetes mellitus with unspecified complications: Secondary | ICD-10-CM | POA: Diagnosis not present

## 2018-03-28 DIAGNOSIS — E1165 Type 2 diabetes mellitus with hyperglycemia: Secondary | ICD-10-CM | POA: Diagnosis not present

## 2018-03-28 DIAGNOSIS — E118 Type 2 diabetes mellitus with unspecified complications: Secondary | ICD-10-CM | POA: Diagnosis not present

## 2018-03-28 DIAGNOSIS — E039 Hypothyroidism, unspecified: Secondary | ICD-10-CM | POA: Diagnosis not present

## 2018-03-28 DIAGNOSIS — I10 Essential (primary) hypertension: Secondary | ICD-10-CM | POA: Diagnosis not present

## 2018-03-28 DIAGNOSIS — E789 Disorder of lipoprotein metabolism, unspecified: Secondary | ICD-10-CM | POA: Diagnosis not present

## 2018-04-06 DIAGNOSIS — E782 Mixed hyperlipidemia: Secondary | ICD-10-CM | POA: Diagnosis not present

## 2018-04-06 DIAGNOSIS — E039 Hypothyroidism, unspecified: Secondary | ICD-10-CM | POA: Diagnosis not present

## 2018-04-06 DIAGNOSIS — E118 Type 2 diabetes mellitus with unspecified complications: Secondary | ICD-10-CM | POA: Diagnosis not present

## 2018-04-13 DIAGNOSIS — I10 Essential (primary) hypertension: Secondary | ICD-10-CM | POA: Diagnosis not present

## 2018-04-13 DIAGNOSIS — E032 Hypothyroidism due to medicaments and other exogenous substances: Secondary | ICD-10-CM | POA: Diagnosis not present

## 2018-04-13 DIAGNOSIS — E118 Type 2 diabetes mellitus with unspecified complications: Secondary | ICD-10-CM | POA: Diagnosis not present

## 2018-04-13 DIAGNOSIS — E789 Disorder of lipoprotein metabolism, unspecified: Secondary | ICD-10-CM | POA: Diagnosis not present

## 2018-04-15 ENCOUNTER — Telehealth: Payer: Self-pay | Admitting: Internal Medicine

## 2018-04-15 NOTE — Telephone Encounter (Signed)
Faxed signed certificate of medical necessity of oxygen to [facility] @[phone]

## 2018-05-03 ENCOUNTER — Ambulatory Visit (INDEPENDENT_AMBULATORY_CARE_PROVIDER_SITE_OTHER)
Admission: RE | Admit: 2018-05-03 | Discharge: 2018-05-03 | Disposition: A | Discharge status: Home / Self Care | Payer: Medicare Other | Source: Ambulatory Visit | Attending: Pulmonary Disease | Admitting: Pulmonary Disease

## 2018-05-03 ENCOUNTER — Inpatient Hospital Stay: Admission: RE | Admit: 2018-05-03 | Payer: Medicare Other | Source: Ambulatory Visit

## 2018-05-03 DIAGNOSIS — R911 Solitary pulmonary nodule: Secondary | ICD-10-CM

## 2018-05-04 ENCOUNTER — Telehealth: Payer: Self-pay | Admitting: Pulmonary Disease

## 2018-05-04 NOTE — Telephone Encounter (Signed)
Called and spoke with patient, he is aware of results and verbalized understanding. Patient states that he has an appointment with his cardiologist in June and will keep that appointment to discuss results.

## 2018-05-06 ENCOUNTER — Ambulatory Visit (INDEPENDENT_AMBULATORY_CARE_PROVIDER_SITE_OTHER): Payer: Medicare Other | Admitting: Internal Medicine

## 2018-05-06 ENCOUNTER — Encounter: Payer: Self-pay | Admitting: Internal Medicine

## 2018-05-06 VITALS — BP 170/84 | HR 58 | Ht 69.0 in | Wt 205.4 lb

## 2018-05-06 DIAGNOSIS — I5033 Acute on chronic diastolic (congestive) heart failure: Secondary | ICD-10-CM | POA: Diagnosis not present

## 2018-05-06 DIAGNOSIS — I2510 Atherosclerotic heart disease of native coronary artery without angina pectoris: Secondary | ICD-10-CM

## 2018-05-06 DIAGNOSIS — J90 Pleural effusion, not elsewhere classified: Secondary | ICD-10-CM

## 2018-05-06 DIAGNOSIS — Z951 Presence of aortocoronary bypass graft: Secondary | ICD-10-CM

## 2018-05-06 DIAGNOSIS — N183 Chronic kidney disease, stage 3 unspecified: Secondary | ICD-10-CM

## 2018-05-06 DIAGNOSIS — I10 Essential (primary) hypertension: Secondary | ICD-10-CM

## 2018-05-06 MED ORDER — FUROSEMIDE 40 MG PO TABS: 80.0000 mg | ORAL_TABLET | Freq: Two times a day (BID) | ORAL | 5 refills | Status: DC

## 2018-05-06 NOTE — Patient Instructions (Signed)
Medication Instructions:   INCREASE lasix to 80mg  twice daily DECREASE aspirin to 81 mg daily  Labwork:  BMET - in one week  Testing/Procedures:  Your physician has requested that you have an echocardiogram @ [address] - [address]rd Floor. Echocardiography is a painless test that uses sound waves to create images of your heart. It provides your doctor with information about the size and shape of your heart and how well your heart's chambers and valves are working. This procedure takes approximately one hour. There are no restrictions for this procedure.  Follow-Up:  Your physician recommends that you schedule a follow-up appointment with Dr. Debara Pickett or PA/NP after your testing   If you need a refill on your cardiac medications before your next appointment, please call your pharmacy.  Any Other Special Instructions Will Be Listed Below (If Applicable).

## 2018-05-06 NOTE — Progress Notes (Signed)
OFFICE NOTE  Chief Complaint:  Shortness of breath  Primary Care Physician: [name], MD  HPI:  [name] is a pleasant gentleman who I previously saw in 2014. He has a history of coronary artery disease status post CABG in 1996. He also has chronic kidney disease with a creatinine that was around 1.8. In the past he seen Dr. Moshe Cipro, however she no longer sees him. He denies any chest pain or worsening shortness of breath. He reports good blood pressure control on his current medicines. He is somewhat active but does not exercise regularly. His primary care provider has been supplying his medications however we have prescribed him in the past for nadolol. He was noted in the past to have a right bundle branch block was which was new. His last stress test was in 2012 which was negative for ischemia.  03/22/2017  [name] returns today for hospital follow-up. I have not seen him since 2016. He has a history of coronary artery disease status post CABG in 1996 as well as chronic kidney disease is had a creatinine is elevated is 1.8 in the past but apparently that improved. He was previously seen Dr. Moshe Cipro. He does have a history of chronic cholecystitis and is part of her workup for that was found to have renal cell cancer. In March she was admitted for surgery and underwent robotic-assisted right partial nephrectomy for a renal mass and combined cholecystectomy. He had difficulty in oxygenation after surgery and was monitored in the ICU for hypoxia. It was felt that there was an element of diastolic congestive heart failure. An echocardiogram was performed on 02/20/2017 which showed an EF of 01-02%, grade 2 diastolic dysfunction and moderate pulmonary hypertension with an RVSP of 61 mmHg. He underwent a subsequent limited echo on 03/09/2017 which showed an EF of 72-53%, grade 2 diastolic function, however there was an increase in RVSP to 85 mmHg suggestive of severe pulmonary  hypertension and there was a moderate localized pericardial effusion around the inferior and inferolateral walls without signs of tamponade physiology. [name] is had persistent shortness of breath and hypoxia. He was sent home on oxygen and recently noted in his urologist office to be hypoxic with an O2 sat duration of 87% on room air.  04/08/2017  [name] was seen today in follow-up. Since I last saw him and put him on diuretics his weight has come down from 204 pounds 195 pounds. He reports improvement in his breathing. We repeated an echocardiogram which shows a stable LV EF 55-60% and resolution of pericardial effusion. Of note he is RVSP is decreased to 46 mmHg, and previously was 61 mmHg. This suggests he may have had some pulmonary venous hypertension. He was also placed on oxygen after seeing Dr. Lake Bells in Pulmonary. A VQ scan was negative for pulmonary emboli.   07/26/2017  [name] returns for follow-up. He is doing exceedingly well. His previous dimension his EF has normalized. He's maintained a stable weight on twice daily Lasix. He's followed up with Dr. Lake Bells who has diagnosed him with obstructive sleep apnea. He has an upcoming CPAP titration. He is on oxygen, mostly at night and with exertion. In general he feels much better. Creatinine has also improved. Recent labs from his primary care provider were also all within normal limits.  02/10/2018  [name] was seen today in follow-up.  Overall he is without complaints.  He is on oxygen chronically.  He denies any chest  pain or worsening shortness of breath.  He had recent lab work in December 2018 which showed a serum creatinine at 2.03.  His creatinine had been much higher in the past.  A lipid profile last in July 2018 showed total cholesterol 112, HDL 35, LDL 62 and triglycerides 77.  Hemoglobin A1c was increased up to 8.4.  As previously followed by Dr. Wilson Singer, but after his retirement has been seeing Dr. Maudie Mercury.  His insulin regimen was  recently switched, including changing him over to Lantus insulin 35 units at night.  05/06/2018  [name] returns today for follow-up.  Recently underwent a CT scan in preparation for follow-up with Dr. Lake Bells.  He was noted to have bilateral pleural effusions which have worsened.  This is associated with worsening shortness of breath.  Blood pressure was elevated today 170/84.  EKG shows sinus bradycardia at 58.  Lab work shows his serum creatinine is elevated up to 1.8, but had been as low as 1.5 recently.  Total cholesterol is 107, HDL 32, LDL 60 and triglycerides 73.  He says his shortness of breath is worse with exertion, particularly walking up stairs or carrying items.  He denies any chest pain.  Of note he had history of cardiomyopathy however LVEF had normalized as of April 2018.  PMHx:  Past Medical History:  Diagnosis Date  . Chronic kidney disease    tumor right kidney  . Coronary artery disease   . Diabetes mellitus    type 2  . Hypertension   . Myocardial infarction ([place])    1997  . Right renal mass     Past Surgical History:  Procedure Laterality Date  . CARDIAC CATHETERIZATION  1996  . CORONARY ARTERY BYPASS GRAFT    . DOPPLER ECHOCARDIOGRAPHY  2011  . HERNIA REPAIR    . IR GENERIC HISTORICAL  12/09/2016   IR RADIOLOGIST EVAL & MGMT 12/09/2016 [name], MD [facility] INTERV RAD  . ROBOTIC ASSITED PARTIAL NEPHRECTOMY Right 02/19/2017   Procedure: XI ROBOTIC ASSITED PARTIAL NEPHRECTOMY;  Surgeon: [name], MD;  Location: [facility];  Service: Urology;  Laterality: Right;    FAMHx:  Family History  Problem Relation Age of Onset  . Diabetes Mother   . Diabetes Sister     SOCHx:   reports that he quit smoking about 21 years ago. His smoking use included cigarettes. He has a 13.50 pack-year smoking history. He has never used smokeless tobacco. He reports that he does not drink alcohol or use drugs.  ALLERGIES:  No Known Allergies  ROS: Pertinent items noted in  HPI and remainder of comprehensive ROS otherwise negative.  HOME MEDS: Current Outpatient Medications  Medication Sig Dispense Refill  . acetaminophen (TYLENOL) 325 MG tablet Take 650 mg by mouth every 6 (six) hours as needed (for pain.).    Marland Kitchen amLODipine (NORVASC) 5 MG tablet Take 5 mg by mouth daily.   0  . aspirin 325 MG EC tablet Take 325 mg by mouth daily.    . benazepril (LOTENSIN) 40 MG tablet Take 40 mg by mouth daily.  0  . cloNIDine (CATAPRES) 0.1 MG tablet Take 0.1 mg by mouth 2 (two) times daily.    Marland Kitchen ezetimibe (ZETIA) 10 MG tablet Take 10 mg by mouth daily.    . furosemide (LASIX) 40 MG tablet TAKE 1 TABLET(40 MG) BY MOUTH TWICE DAILY 60 tablet 6  . HUMALOG KWIKPEN 100 UNIT/ML KiwkPen Inject 10 Units into the skin daily with supper.  0  . LEVEMIR 100 UNIT/ML injection Inject 30 Units into the skin 2 (two) times daily.   11  . nadolol (CORGARD) 40 MG tablet Take 40 mg by mouth daily.    Marland Kitchen oxyCODONE-acetaminophen (PERCOCET/ROXICET) 5-325 MG tablet Take 1 tablet by mouth every 6 (six) hours as needed for severe pain (DO NOT TAKE AT THE SAME TIME AS YOUR TRAMADOL; TAKE ONE OR THE OTHER). 10 tablet 0  . rosuvastatin (CRESTOR) 20 MG tablet Take 20 mg by mouth daily.    Marland Kitchen SYNTHROID 100 MCG tablet Take 100 mcg by mouth daily.   0  . tetrahydrozoline 0.05 % ophthalmic solution Place 1-2 drops into both eyes 3 (three) times daily as needed (for dry eyes).    . traMADol (ULTRAM) 50 MG tablet Take 1 tablet (50 mg total) by mouth every 6 (six) hours as needed for moderate pain. 15 tablet 0   No current facility-administered medications for this visit.     LABS/IMAGING: No results found for this or any previous visit (from the past 48 hour(s)). No results found.  WEIGHTS: Wt Readings from Last 3 Encounters:  05/06/18 205 lb 6.4 oz (93.2 kg)  02/10/18 198 lb (89.8 kg)  11/23/17 196 lb (88.9 kg)    VITALS: BP (!) 170/84   Pulse (!) 58   Ht 5\' 9"  (1.753 m)   Wt 205 lb 6.4 oz (93.2  kg)   BMI 30.33 kg/m   EXAM: General appearance: alert, no distress and on oxygen Neck: no carotid bruit and no JVD Lungs: clear to auscultation bilaterally Heart: regular rate and rhythm, S1, S2 normal, no murmur, click, rub or gallop Abdomen: soft, non-tender; bowel sounds normal; no masses,  no organomegaly Extremities: extremities normal, atraumatic, no cyanosis or edema Pulses: 2+ and symmetric Skin: Skin color, texture, turgor normal. No rashes or lesions Neurologic: Grossly normal Psych: Normal, but did not recall seeing a pulmonologist  EKG: Sinus bradycardia 58-personally reviewed  ASSESSMENT:  1. Acute diastolic congestive heart failure with pleural effusions (EF 55-60%, 03/2017) 2. Mixed pulmonary venous/arterial hypertension 3. Moderate sized pericardial effusion - resolved 4. CAD status post CABG 1996 5. CKD with improving creatinine now 1.54  (as high as 3.84, 2.87, 2.36 recently) 6. Hypertension 7. Dyslipidemia 8. RBBB 9. OSA on CPAP  PLAN: 1.    [name] has become progressively more short of breath.  There is been recent weight gain up from 198 pounds to 205 pounds in the past 2 months.  He has bilateral pleural effusions on CTA and decreased breath sounds with basilar dullness to percussion on exam.  This could represent worsening diastolic heart failure or possibly new systolic heart failure.  I like to repeat an echocardiogram and increase his Lasix to 80 mg twice daily.  He will need repeat lab work in at least 1 week as well as a BNP.  He is scheduled to see Dr. Lake Bells next week and hopefully his diuretic response could be evaluated further than and adjusted accordingly.  Follow-up in 1 month.  Pixie Casino, MD, [facility], [place] Director of the [facility]  [facility] Diplomate of the American Board of Clinical Lipidology Attending Cardiologist  Direct Dial: [phone]  Fax:  [phone]  Website:  [email or web address] 05/06/2018, 10:44 AM

## 2018-05-10 ENCOUNTER — Ambulatory Visit (INDEPENDENT_AMBULATORY_CARE_PROVIDER_SITE_OTHER): Payer: Medicare Other | Admitting: Pulmonary Disease

## 2018-05-10 ENCOUNTER — Other Ambulatory Visit: Payer: Self-pay | Admitting: Pulmonary Disease

## 2018-05-10 ENCOUNTER — Other Ambulatory Visit (INDEPENDENT_AMBULATORY_CARE_PROVIDER_SITE_OTHER): Payer: Medicare Other

## 2018-05-10 ENCOUNTER — Encounter: Payer: Self-pay | Admitting: Pulmonary Disease

## 2018-05-10 VITALS — BP 160/80 | HR 53 | Ht 69.0 in | Wt 202.8 lb

## 2018-05-10 DIAGNOSIS — I5032 Chronic diastolic (congestive) heart failure: Secondary | ICD-10-CM

## 2018-05-10 DIAGNOSIS — R911 Solitary pulmonary nodule: Secondary | ICD-10-CM

## 2018-05-10 DIAGNOSIS — I2510 Atherosclerotic heart disease of native coronary artery without angina pectoris: Secondary | ICD-10-CM

## 2018-05-10 DIAGNOSIS — G4733 Obstructive sleep apnea (adult) (pediatric): Secondary | ICD-10-CM | POA: Diagnosis not present

## 2018-05-10 DIAGNOSIS — I272 Pulmonary hypertension, unspecified: Secondary | ICD-10-CM | POA: Diagnosis not present

## 2018-05-10 DIAGNOSIS — J432 Centrilobular emphysema: Secondary | ICD-10-CM | POA: Diagnosis not present

## 2018-05-10 LAB — BASIC METABOLIC PANEL
BUN: 16 mg/dL (ref 6–23)
CHLORIDE: 106 meq/L (ref 96–112)
CO2: 28 meq/L (ref 19–32)
Calcium: 9.2 mg/dL (ref 8.4–10.5)
Creatinine, Ser: 1.66 mg/dL — ABNORMAL HIGH (ref 0.40–1.50)
GFR: 51.02 mL/min — ABNORMAL LOW (ref >60.00)
Glucose, Bld: 79 mg/dL (ref 70–99)
POTASSIUM: 3.2 meq/L — AB (ref 3.5–5.1)
SODIUM: 142 meq/L (ref 135–145)

## 2018-05-10 LAB — BRAIN NATRIURETIC PEPTIDE: PRO B NATRI PEPTIDE: 875 pg/mL — AB (ref 0.0–100.0)

## 2018-05-10 MED ORDER — POTASSIUM CHLORIDE ER 10 MEQ PO TBCR: EXTENDED_RELEASE_TABLET | ORAL | 2 refills | Status: DC

## 2018-05-10 NOTE — Progress Notes (Signed)
Was able to talk to patient regarding patient's results.  They verbalized an understanding of what was discussed.  Placed order to preferred pharmacy of patient and let him know it should be ready for pick up today. No further questions at this time.

## 2018-05-10 NOTE — Progress Notes (Signed)
Subjective:    Patient ID: [name], male    DOB: 1932-09-27, 84 y.o.   MRN: [id number]  Synopsis: Emphysema, obstructive sleep apnea, pulmonary hypertenion. Referred in April 2018 by Dr. Mali Hilty from cardiology for evaluation of pulmonary hypertension.  He used to smoke cigarettes, he notes that he smoked for 50 years, < 1 ppd.   He was found to have sleep apnea on a sleep study in 2018 and was started on CPAP.   HPI Chief Complaint  Patient presents with  . Follow-up    chest CT 5/28 results, reports he has been well, CPAP is working good for him. Spoke with cardiologist and increased lasix dose.     [name] is here to follow-up on the results of his CT scan from a week ago.  He says that about 2 weeks ago he was feeling more short of breath and his weight had increased.  He saw his cardiologist who adjusted his diuretic medicines and he says he now feels much better.  He does not have any shortness of breath anymore.  No cough no wheezing no mucus production.  No recent fevers or chills or respiratory infections.  He is taking furosemide 80 mg twice a day.  His weight is down to 202, it was 205 when he saw the cardiologist a week ago.  He is using CPAP every night and has no difficulty with it.   Past Medical History:  Diagnosis Date  . Chronic kidney disease    tumor right kidney  . Coronary artery disease   . Diabetes mellitus    type 2  . Hypertension   . Myocardial infarction ([place])    1997  . Right renal mass      Family History  Problem Relation Age of Onset  . Diabetes Mother   . Diabetes Sister         Review of Systems  Constitutional: Negative for fever and unexpected weight change.  HENT: Negative for congestion, dental problem, ear pain, nosebleeds, postnasal drip, rhinorrhea, sinus pressure, sneezing, sore throat and trouble swallowing.   Eyes: Negative for redness and itching.  Respiratory: Positive for shortness of breath. Negative for cough,  chest tightness and wheezing.   Cardiovascular: Negative for palpitations and leg swelling.  Gastrointestinal: Negative for nausea and vomiting.  Genitourinary: Negative for dysuria.  Musculoskeletal: Negative for joint swelling.  Skin: Negative for rash.  Neurological: Negative for headaches.  Hematological: Does not bruise/bleed easily.  Psychiatric/Behavioral: Negative for dysphoric mood. The patient is not nervous/anxious.        Objective:   Physical Exam  Vitals:   05/10/18 1146  BP: (!) 160/80  Pulse: (!) 53  SpO2: 92%  Weight: 202 lb 12.8 oz (92 kg)  Height: 5\' 9"  (1.753 m)   2L   Gen: well appearing HENT: OP clear, TM's clear, neck supple PULM: CTA B, normal percussion CV: RRR, no mgr, trace edema GI: BS+, soft, nontender Derm: no cyanosis or rash Psyche: normal mood and affect     Pulmonary function test: April 2018 pulmonary function testing: Ratio normal, FEV1 2.18 L 85% predicted, 14% change post bronchodilator, forced vital capacity 2.63 L 74% predicted, total lung capacity 4.80 L 68% predicted, ERV 35% predicted, DLCO 8.4 mL 26% predicted  Cardiac imaging: January 2018 nuclear stress test showed an LVEF of 30-44%, low risk study with normal perfusion.  Echo: March 2018 echocardiogram showed an estimated PA pressure of 61 mmHg April 2018  echocardiogram showed an LVEF of 60-65% with grade 2 diastolic dysfunction, moderate concentric left ventricular hypertrophy, left atrium was moderately dilated, pulmonary artery systolic pressure estimate 85 mmHg, right ventricle cavity size normal with normal systolic function, there is a moderate sized pericardial effusion  Imaging: March 2018 chest x-ray images independently reviewed showing bilateral hazy airspace disease with cephalization, left-sided pleural effusion 2018 high-resolution CT scan of the chest shows upper lobe predominant moderate centrilobular emphysema, left-sided pleural effusion, some  interlobular septal thickening and based on lymphadenopathy consistent with congestive heart failure, 3 mm right lower lobe solitary pulmonary nodule, images and apparently reviewed, this is my interpretation May 2019 CT chest showed stable right upper lobe pulmonary nodule, increased bilateral pleural effusions, emphysema, some thoracic adenopathy likely related to volume overload, pulmonary artery enlargement, a sending aorta dilation to 4.1 cm.  Sleep study: July 2018 polysomnogram: Mild obstructive sleep apnea, AHI 8.3, O2 saturation dropped to 84% on 2 L nasal cannula 08/2017 CPAP titration> 10cm H20, needed 2 L O2 blended in  Records from his visit last week with cardiology reviewed where he was given a higher dose of diuresis with Lasix 80 mg twice a day.  Arrangements were made for an echocardiogram.     Assessment & Plan:  Chronic diastolic congestive heart failure ([place]) - Plan: Basic Metabolic Panel (BMET), B Nat Peptide  OSA (obstructive sleep apnea)  Pulmonary hypertension ([facility])  Pulmonary nodule  Centrilobular emphysema ([place])  Discussion: This has been a stable interval for [name].  His CT scan of the chest showed persistent emphysema but he has no symptoms such as wheezing or cough to suggest active COPD to prompt me to treat him with bronchodilators.  His dyspnea has resolved with aggressive diuresis after last week's CT scan showed volume overload and he was up about 7 pounds compared to his prior visit with cardiology.  He is diuresing well.  We need to get renal function labs to make sure that his electrolytes are okay.  I told him that he needs to keep taking furosemide 80 mg twice a day until his weight gets down to 198.  At that point he can take it daily.  If his weight goes above 200 pounds and he needs to take 80 mg twice a day once again.  Plan: Chronic diastolic heart failure: Take furosemide 80 mg twice a day until your weight is 198 pounds.  At that point use  furosemide 80 mg daily.  If your weight goes higher than 200 pounds then take 80 mg twice a day again until your weight is back below 198 pounds.  Centrilobular emphysema: As stated previously at this time I do not think that you would benefit from using any inhaled medicines.  However if you develop wheezing or cough or mucus production let us know and we can reconsider this.  Thoracic aortic aneurysm: This is the medical term for the fact that the main artery in your chest is slightly dilated.  Right now this does not require surgery or any sort of other intervention but it needs to be monitored.  I will reach out to cardiology to see if they should follow this or if they want me to send you to cardiothoracic surgery for further monitoring.  Chronic kidney disease: We will check a basic metabolic panel lab today to make sure that your electrolytes are okay and your kidney function is okay.  Obstructive sleep apnea: Keep using CPAP every night.  We will see  you back in 1 year or sooner if needed   Current Outpatient Medications:  .  acetaminophen (TYLENOL) 325 MG tablet, Take 650 mg by mouth every 6 (six) hours as needed (for pain.)., Disp: , Rfl:  .  amLODipine (NORVASC) 5 MG tablet, Take 5 mg by mouth daily. , Disp: , Rfl: 0 .  aspirin 81 MG EC tablet, Take 81 mg by mouth daily. Swallow whole., Disp: , Rfl:  .  benazepril (LOTENSIN) 40 MG tablet, Take 40 mg by mouth daily., Disp: , Rfl: 0 .  cloNIDine (CATAPRES) 0.1 MG tablet, Take 0.1 mg by mouth 2 (two) times daily., Disp: , Rfl:  .  ezetimibe (ZETIA) 10 MG tablet, Take 10 mg by mouth daily., Disp: , Rfl:  .  furosemide (LASIX) 40 MG tablet, Take 2 tablets (80 mg total) by mouth 2 (two) times daily. (Patient taking differently: Take 80 mg by mouth 2 (two) times daily. Take 1 tab two times daily), Disp: 120 tablet, Rfl: 5 .  HUMALOG KWIKPEN 100 UNIT/ML KiwkPen, Inject 10 Units into the skin daily with supper. , Disp: , Rfl: 0 .  LEVEMIR  100 UNIT/ML injection, Inject 30 Units into the skin 2 (two) times daily. , Disp: , Rfl: 11 .  nadolol (CORGARD) 40 MG tablet, Take 40 mg by mouth daily., Disp: , Rfl:  .  oxyCODONE-acetaminophen (PERCOCET/ROXICET) 5-325 MG tablet, Take 1 tablet by mouth every 6 (six) hours as needed for severe pain (DO NOT TAKE AT THE SAME TIME AS YOUR TRAMADOL; TAKE ONE OR THE OTHER)., Disp: 10 tablet, Rfl: 0 .  rosuvastatin (CRESTOR) 20 MG tablet, Take 20 mg by mouth daily., Disp: , Rfl:  .  SYNTHROID 100 MCG tablet, Take 100 mcg by mouth daily. , Disp: , Rfl: 0 .  tetrahydrozoline 0.05 % ophthalmic solution, Place 1-2 drops into both eyes 3 (three) times daily as needed (for dry eyes)., Disp: , Rfl:  .  traMADol (ULTRAM) 50 MG tablet, Take 1 tablet (50 mg total) by mouth every 6 (six) hours as needed for moderate pain., Disp: 15 tablet, Rfl: 0

## 2018-05-10 NOTE — Progress Notes (Signed)
[name] - the aorta is only mildly dilated. I'm happy to follow it and will refer him to CT surgery when and if necessary. Thanks for seeing him - I agree with the diuretic plan and it appears he is responding to the increased lasix.  -Mali

## 2018-05-10 NOTE — Patient Instructions (Signed)
Chronic diastolic heart failure: Take furosemide 80 mg twice a day until your weight is 198 pounds.  At that point use furosemide 80 mg daily.  If your weight goes higher than 200 pounds then take 80 mg twice a day again until your weight is back below 198 pounds.  Centrilobular emphysema: As stated previously at this time I do not think that you would benefit from using any inhaled medicines.  However if you develop wheezing or cough or mucus production let us know and we can reconsider this.  Thoracic aortic aneurysm: This is the medical term for the fact that the main artery in your chest is slightly dilated.  Right now this does not require surgery or any sort of other intervention but it needs to be monitored.  I will reach out to cardiology to see if they should follow this or if they want me to send you to cardiothoracic surgery for further monitoring.  Chronic kidney disease: We will check a basic metabolic panel lab today to make sure that your electrolytes are okay and your kidney function is okay.  Obstructive sleep apnea: Keep using CPAP every night.  We will see you back in 1 year or sooner if needed

## 2018-05-12 ENCOUNTER — Other Ambulatory Visit: Payer: Self-pay

## 2018-05-12 ENCOUNTER — Ambulatory Visit (HOSPITAL_COMMUNITY): Payer: Medicare Other | Attending: Cardiology

## 2018-05-12 DIAGNOSIS — E1122 Type 2 diabetes mellitus with diabetic chronic kidney disease: Secondary | ICD-10-CM | POA: Diagnosis not present

## 2018-05-12 DIAGNOSIS — I272 Pulmonary hypertension, unspecified: Secondary | ICD-10-CM | POA: Insufficient documentation

## 2018-05-12 DIAGNOSIS — R0600 Dyspnea, unspecified: Secondary | ICD-10-CM | POA: Diagnosis not present

## 2018-05-12 DIAGNOSIS — Z87891 Personal history of nicotine dependence: Secondary | ICD-10-CM | POA: Insufficient documentation

## 2018-05-12 DIAGNOSIS — I130 Hypertensive heart and chronic kidney disease with heart failure and stage 1 through stage 4 chronic kidney disease, or unspecified chronic kidney disease: Secondary | ICD-10-CM | POA: Diagnosis not present

## 2018-05-12 DIAGNOSIS — Z951 Presence of aortocoronary bypass graft: Secondary | ICD-10-CM | POA: Insufficient documentation

## 2018-05-12 DIAGNOSIS — J90 Pleural effusion, not elsewhere classified: Secondary | ICD-10-CM | POA: Insufficient documentation

## 2018-05-12 DIAGNOSIS — I081 Rheumatic disorders of both mitral and tricuspid valves: Secondary | ICD-10-CM | POA: Diagnosis not present

## 2018-05-12 DIAGNOSIS — N183 Chronic kidney disease, stage 3 (moderate): Secondary | ICD-10-CM | POA: Diagnosis not present

## 2018-05-12 DIAGNOSIS — I313 Pericardial effusion (noninflammatory): Secondary | ICD-10-CM | POA: Diagnosis not present

## 2018-05-12 DIAGNOSIS — I2510 Atherosclerotic heart disease of native coronary artery without angina pectoris: Secondary | ICD-10-CM | POA: Insufficient documentation

## 2018-05-12 DIAGNOSIS — I5033 Acute on chronic diastolic (congestive) heart failure: Secondary | ICD-10-CM | POA: Insufficient documentation

## 2018-05-12 DIAGNOSIS — R55 Syncope and collapse: Secondary | ICD-10-CM | POA: Diagnosis not present

## 2018-05-16 DIAGNOSIS — I5033 Acute on chronic diastolic (congestive) heart failure: Secondary | ICD-10-CM | POA: Diagnosis not present

## 2018-05-16 LAB — BASIC METABOLIC PANEL
BUN / CREAT RATIO: 11 (ref 10–24)
BUN: 18 mg/dL (ref 8–27)
CO2: 23 mmol/L (ref 20–29)
CREATININE: 1.65 mg/dL — AB (ref 0.76–1.27)
Calcium: 9.2 mg/dL (ref 8.6–10.2)
Chloride: 107 mmol/L — ABNORMAL HIGH (ref 96–106)
GFR, EST AFRICAN AMERICAN: 44 mL/min/{1.73_m2} — AB (ref >59)
GFR, EST NON AFRICAN AMERICAN: 38 mL/min/{1.73_m2} — AB (ref >59)
Glucose: 150 mg/dL — ABNORMAL HIGH (ref 65–99)
POTASSIUM: 4.2 mmol/L (ref 3.5–5.2)
SODIUM: 141 mmol/L (ref 134–144)

## 2018-05-17 ENCOUNTER — Telehealth: Payer: Self-pay | Admitting: Pulmonary Disease

## 2018-05-17 DIAGNOSIS — J90 Pleural effusion, not elsewhere classified: Secondary | ICD-10-CM

## 2018-05-17 NOTE — Telephone Encounter (Signed)
Called and spoke to patient. Reviewed instructions per BQ and placed order for chest x-ray. Nothing further needed at this time.

## 2018-05-17 NOTE — Addendum Note (Signed)
Addended by: Dolores Lory on: 05/17/2018 05:26 PM   Modules accepted: Orders

## 2018-05-17 NOTE — Telephone Encounter (Signed)
BJ, Please call him to let him know that his echo showed some fluid on his lung.  I'd like for him to take the diuretics as directed by Dr. Debara Pickett for another two weeks.  He should have a CXR 2 view performed on 6/24 to evaluate pleural effusion. Thanks, B

## 2018-05-26 ENCOUNTER — Ambulatory Visit (INDEPENDENT_AMBULATORY_CARE_PROVIDER_SITE_OTHER): Payer: Medicare Other | Admitting: Physician Assistant

## 2018-05-26 ENCOUNTER — Encounter: Payer: Self-pay | Admitting: Physician Assistant

## 2018-05-26 VITALS — BP 144/74 | HR 49 | Ht 69.0 in | Wt 203.0 lb

## 2018-05-26 DIAGNOSIS — E119 Type 2 diabetes mellitus without complications: Secondary | ICD-10-CM | POA: Diagnosis not present

## 2018-05-26 DIAGNOSIS — Z9989 Dependence on other enabling machines and devices: Secondary | ICD-10-CM | POA: Diagnosis not present

## 2018-05-26 DIAGNOSIS — I5033 Acute on chronic diastolic (congestive) heart failure: Secondary | ICD-10-CM

## 2018-05-26 DIAGNOSIS — N183 Chronic kidney disease, stage 3 unspecified: Secondary | ICD-10-CM

## 2018-05-26 DIAGNOSIS — I10 Essential (primary) hypertension: Secondary | ICD-10-CM | POA: Diagnosis not present

## 2018-05-26 DIAGNOSIS — I25810 Atherosclerosis of coronary artery bypass graft(s) without angina pectoris: Secondary | ICD-10-CM | POA: Diagnosis not present

## 2018-05-26 DIAGNOSIS — G4733 Obstructive sleep apnea (adult) (pediatric): Secondary | ICD-10-CM | POA: Diagnosis not present

## 2018-05-26 MED ORDER — FUROSEMIDE 40 MG PO TABS: 120.0000 mg | ORAL_TABLET | Freq: Two times a day (BID) | ORAL | 0 refills | Status: DC

## 2018-05-26 MED ORDER — POTASSIUM CHLORIDE ER 10 MEQ PO TBCR: 20.0000 meq | EXTENDED_RELEASE_TABLET | Freq: Two times a day (BID) | ORAL | 0 refills | Status: DC

## 2018-05-26 NOTE — Progress Notes (Signed)
Cardiology Office Note    Date:  05/26/2018   ID:  AROLDO GALLI, DOB [birth year], MRN [id number]  PCP:  [name], MD  Cardiologist:  Dr. Debara Pickett  Chief Complaint  Patient presents with  . Follow-up    ECHO follow up. Seen for Dr. Claiborne Billings    History of Present Illness:  [name] is a 84 y.o. male with PMH of DM II, HTN, R renal mass s/p robotic assisted partial nephrectomy in March 2018, CKD stage III, OSA on CPAP and CAD s/p CABG 1996.  His baseline creatinine is around 1.8.  Myoview in 2012 was negative for ischemia.  In 2018, patient was found to have a right renal mass, he eventually underwent right partial nephrectomy.  Post surgery, he had hypoxia and was monitored in the ICU.  It was felt he likely had an element of diastolic heart failure.  Echocardiogram obtained during the admission showed EF 60 to 65%, grade 2 DD, RVSP 61 mmHg.  He underwent limited echo on 03/09/2017, this showed the same EF, grade 2 DD, increased of RVSP to 85 mmHg suggestive of severe pulmonary hypertension and a moderate localized pericardial effusion.  He was placed on diuretic with successful removal 10 pounds.  On repeat echocardiogram, his RVSP has decreased to 97mmHg.  He was placed on oxygen after seen by Dr. Lake Bells of pulmonology.  VQ scan was negative for pulmonary emboli.  Recent CT angiogram showed bilateral pleural effusion.  His weight was increased to 198 pounds to 205 pounds, during the last visit on 05/06/2018, Dr. Debara Pickett increased his Lasix to 80 mg twice daily while decreasing aspirin to 81 mg daily.  Repeat echocardiogram obtained on 05/12/2018 showed EF 55 to 60%, moderate LVH, grade 2 DD, diastolic flattening of the ventricular septum consistent with RV volume overload, mild MR, severe LAE, severe TR, PA peak pressure 90 mmHg.  Patient presents today for cardiology office visit.  He denies any significant shortness of breath at baseline.  He denies any chest discomfort.  His weight did decrease by 2  pounds since the last office visit.  He continued to have at least 2+ pitting edema in bilateral lower extremity.  Heart rate is borderline low today, however this seems to be his baseline.  He denies any dizziness.  I think he is still 5 to 7 pounds volume overloaded, I recommended to increase his Lasix to 120 mg twice daily while increasing the potassium supplement to 20 mEq twice daily.  I was not very confident about how much understood the medication adjustment.  Unfortunately he did not have any family member around him to help managing his medication either.  I will plan to obtain a basic metabolic panel next Monday or Tuesday to follow-up on the renal function and electrolyte.  I will also see him closely in 3 weeks for follow-up.   Past Medical History:  Diagnosis Date  . Chronic kidney disease    tumor right kidney  . Coronary artery disease   . Diabetes mellitus    type 2  . Hypertension   . Myocardial infarction ([place])    1997  . Right renal mass     Past Surgical History:  Procedure Laterality Date  . CARDIAC CATHETERIZATION  1996  . CORONARY ARTERY BYPASS GRAFT    . DOPPLER ECHOCARDIOGRAPHY  2011  . HERNIA REPAIR    . IR GENERIC HISTORICAL  12/09/2016   IR RADIOLOGIST EVAL & MGMT 12/09/2016 [name], MD  [facility] INTERV RAD  . ROBOTIC ASSITED PARTIAL NEPHRECTOMY Right 02/19/2017   Procedure: XI ROBOTIC ASSITED PARTIAL NEPHRECTOMY;  Surgeon: [name], MD;  Location: [facility] ORS;  Service: Urology;  Laterality: Right;    Current Medications: Outpatient Medications Prior to Visit  Medication Sig Dispense Refill  . acetaminophen (TYLENOL) 325 MG tablet Take 650 mg by mouth every 6 (six) hours as needed (for pain.).    Marland Kitchen amLODipine (NORVASC) 5 MG tablet Take 5 mg by mouth daily.   0  . aspirin 81 MG EC tablet Take 81 mg by mouth daily. Swallow whole.    . benazepril (LOTENSIN) 40 MG tablet Take 40 mg by mouth daily.  0  . cloNIDine (CATAPRES) 0.1 MG tablet Take 0.1 mg by  mouth 2 (two) times daily.    Marland Kitchen ezetimibe (ZETIA) 10 MG tablet Take 10 mg by mouth daily.    Marland Kitchen HUMALOG KWIKPEN 100 UNIT/ML KiwkPen Inject 10 Units into the skin daily with supper.   0  . LEVEMIR 100 UNIT/ML injection Inject 30 Units into the skin 2 (two) times daily.   11  . nadolol (CORGARD) 40 MG tablet Take 40 mg by mouth daily.    . rosuvastatin (CRESTOR) 20 MG tablet Take 20 mg by mouth daily.    Marland Kitchen SYNTHROID 100 MCG tablet Take 100 mcg by mouth daily.   0  . furosemide (LASIX) 40 MG tablet Take 2 tablets (80 mg total) by mouth 2 (two) times daily. (Patient taking differently: Take 80 mg by mouth 2 (two) times daily. Take 1 tab two times daily) 120 tablet 5  . potassium chloride (K-DUR) 10 MEQ tablet Take 61mEq daily 90 tablet 2  . oxyCODONE-acetaminophen (PERCOCET/ROXICET) 5-325 MG tablet Take 1 tablet by mouth every 6 (six) hours as needed for severe pain (DO NOT TAKE AT THE SAME TIME AS YOUR TRAMADOL; TAKE ONE OR THE OTHER). (Patient not taking: Reported on 05/26/2018) 10 tablet 0  . tetrahydrozoline 0.05 % ophthalmic solution Place 1-2 drops into both eyes 3 (three) times daily as needed (for dry eyes).    . traMADol (ULTRAM) 50 MG tablet Take 1 tablet (50 mg total) by mouth every 6 (six) hours as needed for moderate pain. (Patient not taking: Reported on 05/26/2018) 15 tablet 0   No facility-administered medications prior to visit.      Allergies:   Patient has no known allergies.   Social History   Socioeconomic History  . Marital status: Married    Spouse name: Not on file  . Number of children: Not on file  . Years of education: Not on file  . Highest education level: Not on file  Occupational History  . Not on file  Social Needs  . Financial resource strain: Not on file  . Food insecurity:    Worry: Not on file    Inability: Not on file  . Transportation needs:    Medical: Not on file    Non-medical: Not on file  Tobacco Use  . Smoking status: Former Smoker     Packs/day: 0.30    Years: 45.00    Pack years: 13.50    Types: Cigarettes    Last attempt to quit: 02/15/1997    Years since quitting: 21.2  . Smokeless tobacco: Never Used  Substance and Sexual Activity  . Alcohol use: No  . Drug use: No  . Sexual activity: Yes  Lifestyle  . Physical activity:    Days per week: Not on  file    Minutes per session: Not on file  . Stress: Not on file  Relationships  . Social connections:    Talks on phone: Not on file    Gets together: Not on file    Attends religious service: Not on file    Active member of club or organization: Not on file    Attends meetings of clubs or organizations: Not on file    Relationship status: Not on file  Other Topics Concern  . Not on file  Social History Narrative  . Not on file     Family History:  The patient's family history includes Diabetes in his mother and sister.   ROS:   Please see the history of present illness.    ROS All other systems reviewed and are negative.   PHYSICAL EXAM:   VS:  BP (!) 144/74   Pulse (!) 49   Ht 5\' 9"  (1.753 m)   Wt 203 lb (92.1 kg)   BMI 29.98 kg/m    GEN: Well nourished, well developed, in no acute distress  HEENT: normal  Neck: no JVD, carotid bruits, or masses Cardiac: RRR; no murmurs, rubs, or gallops. 2+ edema  Respiratory:  clear to auscultation bilaterally, normal work of breathing GI: soft, nontender, nondistended, + BS MS: no deformity or atrophy  Skin: warm and dry, no rash Neuro:  Alert and Oriented x 3, Strength and sensation are intact Psych: euthymic mood, full affect  Wt Readings from Last 3 Encounters:  05/26/18 203 lb (92.1 kg)  05/10/18 202 lb 12.8 oz (92 kg)  05/06/18 205 lb 6.4 oz (93.2 kg)      Studies/Labs Reviewed:   EKG:  EKG is not ordered today.    Recent Labs: 11/23/2017: ALT 15; B Natriuretic Peptide 865.9; Hemoglobin 14.6; Platelets 228 05/10/2018: Pro B Natriuretic peptide (BNP) 875.0 05/16/2018: BUN 18; Creatinine, Ser  1.65; Potassium 4.2; Sodium 141   Lipid Panel    Component Value Date/Time   CHOL  06/27/2010 0342    130        ATP III CLASSIFICATION:  <200     mg/dL   Desirable  200-239  mg/dL   Borderline High  >=240    mg/dL   High          TRIG 107 06/27/2010 0342   HDL 44 06/27/2010 0342   CHOLHDL 3.0 06/27/2010 0342   VLDL 21 06/27/2010 0342   LDLCALC  06/27/2010 0342    65        Total Cholesterol/HDL:CHD Risk Coronary Heart Disease Risk Table                     Men   Women  1/2 Average Risk   3.4   3.3  Average Risk       5.0   4.4  2 X Average Risk   9.6   7.1  3 X Average Risk  23.4   11.0        Use the calculated Patient Ratio above and the CHD Risk Table to determine the patient's CHD Risk.        ATP III CLASSIFICATION (LDL):  <100     mg/dL   Optimal  100-129  mg/dL   Near or Above                    Optimal  130-159  mg/dL   Borderline  160-189  mg/dL   High  >  190     mg/dL   Very High    Additional studies/ records that were reviewed today include:    Echo 05/12/2018 LV EF: 55% -   60% Study Conclusions  - Left ventricle: The cavity size was normal. There was moderate   concentric hypertrophy. Systolic function was normal. The   estimated ejection fraction was in the range of 55% to 60%. Wall   motion was normal; there were no regional wall motion   abnormalities. Features are consistent with a pseudonormal left   ventricular filling pattern, with concomitant abnormal relaxation   and increased filling pressure (grade 2 diastolic dysfunction).   Doppler parameters are consistent with elevated ventricular   end-diastolic filling pressure. - Ventricular septum: The contour showed diastolic flattening and   systolic flattening consistent with RV volume and pressure   overload. - Mitral valve: There was mild regurgitation. - Left atrium: The atrium was severely dilated. - Right ventricle: The cavity size was moderately dilated. Wall   thickness was normal.  Systolic function was mildly reduced. - Right atrium: The atrium was moderately dilated. - Tricuspid valve: There was severe regurgitation. - Pulmonary arteries: Systolic pressure was severely increased. PA   peak pressure: 90 mm Hg (S). - Pericardium, extracardiac: There was a large left pleural   effusion.  ASSESSMENT:    1. Acute on chronic diastolic congestive heart failure ([place])   2. Essential hypertension   3. Controlled type 2 diabetes mellitus without complication, without long-term current use of insulin ([place])   4. CKD (chronic kidney disease), stage III ([place])   5. OSA on CPAP   6. Coronary artery disease involving coronary bypass graft of native heart without angina pectoris      PLAN:  In order of problems listed above:  1. Acute on chronic diastolic heart failure: Although patient did lose roughly 2 pounds since the last office visit, however he still appears to be volume overloaded with 2+ pitting edema in bilateral lower extremity.  I will increase his diuretic Lasix to 120 mg twice daily and increase his potassium supplement to 20 mEq twice daily.  He will need a basic metabolic panel in 5 days.  I plan to follow-up with the patient in 3 weeks for reassessment  2. Hypertension: Blood pressure borderline high, undergoing further diuresis  3. DM 2: On insulin, managed by primary care provider  4. CKD stage III: Obtain basic metabolic panel next Monday or Tuesday to follow-up on renal function and electrolytes  5. CAD s/p CABG: Denies any recent chest pain or shortness of breath.    Medication Adjustments/Labs and Tests Ordered: Current medicines are reviewed at length with the patient today.  Concerns regarding medicines are outlined above.  Medication changes, Labs and Tests ordered today are listed in the Patient Instructions below. Patient Instructions  Medication Instructions:  1. INCREASE LASIX (FURSOEMIDE) TO 3 TABLETS IN THE MORNING AND 3 TABLETS IN THE  EVENING 2. INCREASE POTASSIUM TO 2 TABLETS IN THE MORNING AND 2 TABLET IN THE EVENING   Labwork: Your physician recommends that you return for lab work in: 5 DAYS BMET  Testing/Procedures: NONE   Follow-Up: Your physician recommends that you schedule a follow-up appointment in: 3 [place], PA-C  Any Other Special Instructions Will Be Listed Below (If Applicable). If you need a refill on your cardiac medications before your next appointment, please call your pharmacy.     Signed, Almyra Deforest, PA  05/26/2018 10:25 AM    [address], [place], [place]  [address] Phone: [phone]; Fax: [phone]

## 2018-05-26 NOTE — Patient Instructions (Signed)
Medication Instructions:  1. INCREASE LASIX (FURSOEMIDE) TO 3 TABLETS IN THE MORNING AND 3 TABLETS IN THE EVENING 2. INCREASE POTASSIUM TO 2 TABLETS IN THE MORNING AND 2 TABLET IN THE EVENING   Labwork: Your physician recommends that you return for lab work in: 5 DAYS BMET  Testing/Procedures: NONE   Follow-Up: Your physician recommends that you schedule a follow-up appointment in: 3 [place], PA-C  Any Other Special Instructions Will Be Listed Below (If Applicable). If you need a refill on your cardiac medications before your next appointment, please call your pharmacy.

## 2018-05-31 DIAGNOSIS — I5032 Chronic diastolic (congestive) heart failure: Secondary | ICD-10-CM | POA: Diagnosis not present

## 2018-06-01 ENCOUNTER — Telehealth: Payer: Self-pay | Admitting: Physician Assistant

## 2018-06-01 LAB — BASIC METABOLIC PANEL
BUN / CREAT RATIO: 10 (ref 10–24)
BUN: 17 mg/dL (ref 8–27)
CO2: 26 mmol/L (ref 20–29)
CREATININE: 1.68 mg/dL — AB (ref 0.76–1.27)
Calcium: 9.4 mg/dL (ref 8.6–10.2)
Chloride: 105 mmol/L (ref 96–106)
GFR calc Af Amer: 43 mL/min/{1.73_m2} — ABNORMAL LOW (ref >59)
GFR calc non Af Amer: 37 mL/min/{1.73_m2} — ABNORMAL LOW (ref >59)
GLUCOSE: 75 mg/dL (ref 65–99)
POTASSIUM: 4.2 mmol/L (ref 3.5–5.2)
SODIUM: 145 mmol/L — AB (ref 134–144)

## 2018-06-01 NOTE — Telephone Encounter (Signed)
Pt advised lab results. Verbalized understanding to no changes in present med regimen.

## 2018-06-01 NOTE — Telephone Encounter (Signed)
New message:  Pt is returning a call for lab results. 

## 2018-06-16 ENCOUNTER — Ambulatory Visit (INDEPENDENT_AMBULATORY_CARE_PROVIDER_SITE_OTHER): Payer: Medicare Other | Admitting: Physician Assistant

## 2018-06-16 ENCOUNTER — Encounter: Payer: Self-pay | Admitting: Physician Assistant

## 2018-06-16 VITALS — BP 150/80 | HR 51 | Ht 69.0 in | Wt 193.2 lb

## 2018-06-16 DIAGNOSIS — N183 Chronic kidney disease, stage 3 unspecified: Secondary | ICD-10-CM

## 2018-06-16 DIAGNOSIS — I5032 Chronic diastolic (congestive) heart failure: Secondary | ICD-10-CM | POA: Diagnosis not present

## 2018-06-16 DIAGNOSIS — G4733 Obstructive sleep apnea (adult) (pediatric): Secondary | ICD-10-CM | POA: Diagnosis not present

## 2018-06-16 DIAGNOSIS — Z9989 Dependence on other enabling machines and devices: Secondary | ICD-10-CM | POA: Diagnosis not present

## 2018-06-16 DIAGNOSIS — E119 Type 2 diabetes mellitus without complications: Secondary | ICD-10-CM | POA: Diagnosis not present

## 2018-06-16 DIAGNOSIS — I10 Essential (primary) hypertension: Secondary | ICD-10-CM | POA: Diagnosis not present

## 2018-06-16 DIAGNOSIS — I25810 Atherosclerosis of coronary artery bypass graft(s) without angina pectoris: Secondary | ICD-10-CM

## 2018-06-16 NOTE — Patient Instructions (Signed)
Medication Instructions:  Your physician recommends that you continue on your current medications as directed. Please refer to the Current Medication list given to you today.  Labwork: Your physician recommends that you return for lab work in: TODAY-BMET  Testing/Procedures: None   Follow-Up: Your physician recommends that you schedule a follow-up appointment in: 3 months with Dr Debara Pickett.  Any Other Special Instructions Will Be Listed Below (If Applicable). If you need a refill on your cardiac medications before your next appointment, please call your pharmacy.

## 2018-06-16 NOTE — Progress Notes (Signed)
Cardiology Office Note    Date:  06/18/2018   ID:  [name], DOB [birth year], MRN [id number]  PCP:  [name], MD  Cardiologist:  Dr. Debara Pickett   Chief Complaint  Patient presents with  . Follow-up    seen for Dr. Debara Pickett.     History of Present Illness:  [name] is a 84 y.o. male with PMH of DM II, HTN, R renal mass s/p robotic assisted partial nephrectomy in March 2018, CKD stage III, OSA on CPAP and CAD s/p CABG 1996.  His baseline creatinine is around 1.8.  Myoview in 2012 was negative for ischemia.  In 2018, patient was found to have a right renal mass, he eventually underwent right partial nephrectomy.  Post surgery, he had hypoxia and was monitored in the ICU.  It was felt he likely had an element of diastolic heart failure.  Echocardiogram obtained during the admission showed EF 60 to 65%, grade 2 DD, RVSP 61 mmHg.  He underwent limited echo on 03/09/2017, this showed the same EF, grade 2 DD, increased of RVSP to 85 mmHg suggestive of severe pulmonary hypertension and a moderate localized pericardial effusion.  He was placed on diuretic with successful removal 10 pounds.  On repeat echocardiogram, his RVSP has decreased to 83mmHg.  He was placed on oxygen after seen by Dr. Lake Bells of pulmonology.  VQ scan was negative for pulmonary emboli.  Recent CT angiogram showed bilateral pleural effusion.  His weight was increased to 198 pounds to 205 pounds, during the last visit on 05/06/2018, Dr. Debara Pickett increased his Lasix to 80 mg twice daily while decreasing aspirin to 81 mg daily.  Repeat echocardiogram obtained on 05/12/2018 showed EF 55 to 60%, moderate LVH, grade 2 DD, diastolic flattening of the ventricular septum consistent with RV volume overload, mild MR, severe LAE, severe TR, PA peak pressure 90 mmHg.  I last saw the patient on 05/26/2018, he was having at least 2+ pitting edema in bilateral lower extremity.  His heart rate was also borderline low at that day however this seems to be his  baseline.  I increased his Lasix to 120 mg twice daily while increasing potassium supplement to 20 mEq twice daily.  Follow-up with lab work in 5 days showed stable renal function and electrolyte.  He presents today for cardiology office visit.  He denies any chest pain, his lower extremity edema has resolved.  He is feeling very well on the current medication.  I will repeat a basic metabolic panel.    Past Medical History:  Diagnosis Date  . Chronic kidney disease    tumor right kidney  . Coronary artery disease   . Diabetes mellitus    type 2  . Hypertension   . Myocardial infarction ([place])    1997  . Right renal mass     Past Surgical History:  Procedure Laterality Date  . CARDIAC CATHETERIZATION  1996  . CORONARY ARTERY BYPASS GRAFT    . DOPPLER ECHOCARDIOGRAPHY  2011  . HERNIA REPAIR    . IR GENERIC HISTORICAL  12/09/2016   IR RADIOLOGIST EVAL & MGMT 12/09/2016 [name], MD [facility] INTERV RAD  . ROBOTIC ASSITED PARTIAL NEPHRECTOMY Right 02/19/2017   Procedure: XI ROBOTIC ASSITED PARTIAL NEPHRECTOMY;  Surgeon: [name], MD;  Location: [facility];  Service: Urology;  Laterality: Right;    Current Medications: Outpatient Medications Prior to Visit  Medication Sig Dispense Refill  . acetaminophen (TYLENOL) 325 MG tablet Take  650 mg by mouth every 6 (six) hours as needed (for pain.).    Marland Kitchen amLODipine (NORVASC) 5 MG tablet Take 5 mg by mouth daily.   0  . aspirin 81 MG EC tablet Take 81 mg by mouth daily. Swallow whole.    . benazepril (LOTENSIN) 40 MG tablet Take 40 mg by mouth daily.  0  . cloNIDine (CATAPRES) 0.1 MG tablet Take 0.1 mg by mouth 2 (two) times daily.    Marland Kitchen ezetimibe (ZETIA) 10 MG tablet Take 10 mg by mouth daily.    . furosemide (LASIX) 40 MG tablet Take 3 tablets (120 mg total) by mouth 2 (two) times daily. 180 tablet 0  . HUMALOG KWIKPEN 100 UNIT/ML KiwkPen Inject 10 Units into the skin daily with supper.   0  . LEVEMIR 100 UNIT/ML injection Inject 30 Units  into the skin 2 (two) times daily.   11  . nadolol (CORGARD) 40 MG tablet Take 40 mg by mouth daily.    . potassium chloride (K-DUR) 10 MEQ tablet Take 2 tablets (20 mEq total) by mouth 2 (two) times daily. 120 tablet 0  . rosuvastatin (CRESTOR) 20 MG tablet Take 20 mg by mouth daily.    Marland Kitchen SYNTHROID 100 MCG tablet Take 100 mcg by mouth daily.   0   No facility-administered medications prior to visit.      Allergies:   Patient has no known allergies.   Social History   Socioeconomic History  . Marital status: Married    Spouse name: Not on file  . Number of children: Not on file  . Years of education: Not on file  . Highest education level: Not on file  Occupational History  . Not on file  Social Needs  . Financial resource strain: Not on file  . Food insecurity:    Worry: Not on file    Inability: Not on file  . Transportation needs:    Medical: Not on file    Non-medical: Not on file  Tobacco Use  . Smoking status: Former Smoker    Packs/day: 0.30    Years: 45.00    Pack years: 13.50    Types: Cigarettes    Last attempt to quit: 02/15/1997    Years since quitting: 21.3  . Smokeless tobacco: Never Used  Substance and Sexual Activity  . Alcohol use: No  . Drug use: No  . Sexual activity: Yes  Lifestyle  . Physical activity:    Days per week: Not on file    Minutes per session: Not on file  . Stress: Not on file  Relationships  . Social connections:    Talks on phone: Not on file    Gets together: Not on file    Attends religious service: Not on file    Active member of club or organization: Not on file    Attends meetings of clubs or organizations: Not on file    Relationship status: Not on file  Other Topics Concern  . Not on file  Social History Narrative  . Not on file     Family History:  The patient's family history includes Diabetes in his mother and sister.   ROS:   Please see the history of present illness.    ROS All other systems reviewed and  are negative.   PHYSICAL EXAM:   VS:  BP (!) 150/80   Pulse (!) 51   Ht 5\' 9"  (1.753 m)   Wt 193 lb 3.2 oz (  87.6 kg)   BMI 28.53 kg/m    GEN: Well nourished, well developed, in no acute distress  HEENT: normal  Neck: no JVD, carotid bruits, or masses Cardiac: RRR; no murmurs, rubs, or gallops,no edema  Respiratory:  clear to auscultation bilaterally, normal work of breathing GI: soft, nontender, nondistended, + BS MS: no deformity or atrophy  Skin: warm and dry, no rash Neuro:  Alert and Oriented x 3, Strength and sensation are intact Psych: euthymic mood, full affect  Wt Readings from Last 3 Encounters:  06/16/18 193 lb 3.2 oz (87.6 kg)  05/26/18 203 lb (92.1 kg)  05/10/18 202 lb 12.8 oz (92 kg)      Studies/Labs Reviewed:   EKG:  EKG is not ordered today.   Recent Labs: 11/23/2017: ALT 15; B Natriuretic Peptide 865.9; Hemoglobin 14.6; Platelets 228 05/10/2018: Pro B Natriuretic peptide (BNP) 875.0 06/16/2018: BUN 21; Creatinine, Ser 1.72; Potassium 4.4; Sodium 145   Lipid Panel    Component Value Date/Time   CHOL  06/27/2010 0342    130        ATP III CLASSIFICATION:  <200     mg/dL   Desirable  200-239  mg/dL   Borderline High  >=240    mg/dL   High          TRIG 107 06/27/2010 0342   HDL 44 06/27/2010 0342   CHOLHDL 3.0 06/27/2010 0342   VLDL 21 06/27/2010 0342   LDLCALC  06/27/2010 0342    65        Total Cholesterol/HDL:CHD Risk Coronary Heart Disease Risk Table                     Men   Women  1/2 Average Risk   3.4   3.3  Average Risk       5.0   4.4  2 X Average Risk   9.6   7.1  3 X Average Risk  23.4   11.0        Use the calculated Patient Ratio above and the CHD Risk Table to determine the patient's CHD Risk.        ATP III CLASSIFICATION (LDL):  <100     mg/dL   Optimal  100-129  mg/dL   Near or Above                    Optimal  130-159  mg/dL   Borderline  160-189  mg/dL   High  >190     mg/dL   Very High    Additional studies/  records that were reviewed today include:   Echo 05/12/2018 LV EF: 55% -   60% Study Conclusions  - Left ventricle: The cavity size was normal. There was moderate   concentric hypertrophy. Systolic function was normal. The   estimated ejection fraction was in the range of 55% to 60%. Wall   motion was normal; there were no regional wall motion   abnormalities. Features are consistent with a pseudonormal left   ventricular filling pattern, with concomitant abnormal relaxation   and increased filling pressure (grade 2 diastolic dysfunction).   Doppler parameters are consistent with elevated ventricular   end-diastolic filling pressure. - Ventricular septum: The contour showed diastolic flattening and   systolic flattening consistent with RV volume and pressure   overload. - Mitral valve: There was mild regurgitation. - Left atrium: The atrium was severely dilated. - Right ventricle: The cavity size was moderately  dilated. Wall   thickness was normal. Systolic function was mildly reduced. - Right atrium: The atrium was moderately dilated. - Tricuspid valve: There was severe regurgitation. - Pulmonary arteries: Systolic pressure was severely increased. PA   peak pressure: 90 mm Hg (S). - Pericardium, extracardiac: There was a large left pleural   effusion.    ASSESSMENT:    1. Chronic diastolic congestive heart failure ([place])   2. Essential hypertension   3. Controlled type 2 diabetes mellitus without complication, without long-term current use of insulin ([place])   4. CKD (chronic kidney disease), stage III ([place])   5. OSA on CPAP   6. Coronary artery disease involving coronary bypass graft of native heart without angina pectoris      PLAN:  In order of problems listed above:  1. Chronic diastolic heart failure: Appears to be euvolemic after increasing diuretic.  Will continue on the current medication.  Obtain basic metabolic panel  2. CAD: Denies any chest discomfort.   Continue aspirin and Crestor.  3. CKD stage III: Obtain basic metabolic panel  4. Hypertension: Blood pressure mildly elevated today, however according to patient, his blood pressure is quite well controlled at home  5. DM2: Managed by primary care provider    Medication Adjustments/Labs and Tests Ordered: Current medicines are reviewed at length with the patient today.  Concerns regarding medicines are outlined above.  Medication changes, Labs and Tests ordered today are listed in the Patient Instructions below. Patient Instructions  Medication Instructions:  Your physician recommends that you continue on your current medications as directed. Please refer to the Current Medication list given to you today.  Labwork: Your physician recommends that you return for lab work in: TODAY-BMET  Testing/Procedures: None   Follow-Up: Your physician recommends that you schedule a follow-up appointment in: 3 months with Dr Debara Pickett.  Any Other Special Instructions Will Be Listed Below (If Applicable). If you need a refill on your cardiac medications before your next appointment, please call your pharmacy.     [name], [place]  06/18/2018 11:29 PM    [address], [place], [place]  [address] Phone: [phone]; Fax: [phone]

## 2018-06-17 LAB — BASIC METABOLIC PANEL
BUN/Creatinine Ratio: 12 (ref 10–24)
BUN: 21 mg/dL (ref 8–27)
CALCIUM: 9.4 mg/dL (ref 8.6–10.2)
CHLORIDE: 104 mmol/L (ref 96–106)
CO2: 23 mmol/L (ref 20–29)
Creatinine, Ser: 1.72 mg/dL — ABNORMAL HIGH (ref 0.76–1.27)
GFR calc non Af Amer: 36 mL/min/{1.73_m2} — ABNORMAL LOW (ref >59)
GFR, EST AFRICAN AMERICAN: 41 mL/min/{1.73_m2} — AB (ref >59)
Glucose: 202 mg/dL — ABNORMAL HIGH (ref 65–99)
POTASSIUM: 4.4 mmol/L (ref 3.5–5.2)
Sodium: 145 mmol/L — ABNORMAL HIGH (ref 134–144)

## 2018-06-18 ENCOUNTER — Encounter: Payer: Self-pay | Admitting: Physician Assistant

## 2018-06-27 DIAGNOSIS — E118 Type 2 diabetes mellitus with unspecified complications: Secondary | ICD-10-CM | POA: Diagnosis not present

## 2018-06-27 DIAGNOSIS — E1165 Type 2 diabetes mellitus with hyperglycemia: Secondary | ICD-10-CM | POA: Diagnosis not present

## 2018-06-27 DIAGNOSIS — E039 Hypothyroidism, unspecified: Secondary | ICD-10-CM | POA: Diagnosis not present

## 2018-06-27 DIAGNOSIS — E789 Disorder of lipoprotein metabolism, unspecified: Secondary | ICD-10-CM | POA: Diagnosis not present

## 2018-06-27 DIAGNOSIS — I10 Essential (primary) hypertension: Secondary | ICD-10-CM | POA: Diagnosis not present

## 2018-07-06 ENCOUNTER — Other Ambulatory Visit: Payer: Self-pay | Admitting: Internal Medicine

## 2018-07-06 ENCOUNTER — Telehealth: Payer: Self-pay

## 2018-07-06 NOTE — Telephone Encounter (Signed)
Rx request sent to pharmacy.  

## 2018-07-06 NOTE — Telephone Encounter (Signed)
-----   Message from Gar Ponto, [place] sent at 07/06/2018 3:22 PM EDT -----  Pharmacy is requesting refill for this med however Dr. Debara Pickett has never filled it. Please address thank you.

## 2018-07-06 NOTE — Telephone Encounter (Signed)
What medication

## 2018-07-06 NOTE — Telephone Encounter (Signed)
Rosuvastatin CRESTOR 20 mg tablet

## 2018-07-09 ENCOUNTER — Other Ambulatory Visit: Payer: Self-pay | Admitting: Physician Assistant

## 2018-07-13 DIAGNOSIS — E118 Type 2 diabetes mellitus with unspecified complications: Secondary | ICD-10-CM | POA: Diagnosis not present

## 2018-07-13 DIAGNOSIS — E032 Hypothyroidism due to medicaments and other exogenous substances: Secondary | ICD-10-CM | POA: Diagnosis not present

## 2018-07-14 DIAGNOSIS — E119 Type 2 diabetes mellitus without complications: Secondary | ICD-10-CM | POA: Diagnosis not present

## 2018-07-20 DIAGNOSIS — E789 Disorder of lipoprotein metabolism, unspecified: Secondary | ICD-10-CM | POA: Diagnosis not present

## 2018-07-20 DIAGNOSIS — I10 Essential (primary) hypertension: Secondary | ICD-10-CM | POA: Diagnosis not present

## 2018-07-20 DIAGNOSIS — I700 Atherosclerosis of aorta: Secondary | ICD-10-CM | POA: Diagnosis not present

## 2018-07-20 DIAGNOSIS — I2510 Atherosclerotic heart disease of native coronary artery without angina pectoris: Secondary | ICD-10-CM | POA: Diagnosis not present

## 2018-07-20 DIAGNOSIS — E039 Hypothyroidism, unspecified: Secondary | ICD-10-CM | POA: Diagnosis not present

## 2018-07-20 DIAGNOSIS — E118 Type 2 diabetes mellitus with unspecified complications: Secondary | ICD-10-CM | POA: Diagnosis not present

## 2018-08-02 ENCOUNTER — Other Ambulatory Visit: Payer: Self-pay | Admitting: Pulmonary Disease

## 2018-08-23 ENCOUNTER — Other Ambulatory Visit: Payer: Self-pay | Admitting: Physician Assistant

## 2018-08-29 DIAGNOSIS — E789 Disorder of lipoprotein metabolism, unspecified: Secondary | ICD-10-CM | POA: Diagnosis not present

## 2018-08-29 DIAGNOSIS — E1165 Type 2 diabetes mellitus with hyperglycemia: Secondary | ICD-10-CM | POA: Diagnosis not present

## 2018-08-29 DIAGNOSIS — E039 Hypothyroidism, unspecified: Secondary | ICD-10-CM | POA: Diagnosis not present

## 2018-08-29 DIAGNOSIS — E118 Type 2 diabetes mellitus with unspecified complications: Secondary | ICD-10-CM | POA: Diagnosis not present

## 2018-08-29 DIAGNOSIS — I10 Essential (primary) hypertension: Secondary | ICD-10-CM | POA: Diagnosis not present

## 2018-09-05 ENCOUNTER — Other Ambulatory Visit: Payer: Self-pay | Admitting: Internal Medicine

## 2018-09-06 DIAGNOSIS — C642 Malignant neoplasm of left kidney, except renal pelvis: Secondary | ICD-10-CM | POA: Diagnosis not present

## 2018-09-08 ENCOUNTER — Other Ambulatory Visit: Payer: Self-pay | Admitting: Urology

## 2018-09-08 ENCOUNTER — Ambulatory Visit (HOSPITAL_COMMUNITY)
Admission: RE | Admit: 2018-09-08 | Discharge: 2018-09-08 | Disposition: A | Discharge status: Home / Self Care | Payer: Medicare Other | Source: Ambulatory Visit | Attending: Urology | Admitting: Urology

## 2018-09-08 DIAGNOSIS — J90 Pleural effusion, not elsewhere classified: Secondary | ICD-10-CM | POA: Insufficient documentation

## 2018-09-08 DIAGNOSIS — C642 Malignant neoplasm of left kidney, except renal pelvis: Secondary | ICD-10-CM

## 2018-09-08 DIAGNOSIS — C649 Malignant neoplasm of unspecified kidney, except renal pelvis: Secondary | ICD-10-CM | POA: Diagnosis not present

## 2018-09-08 DIAGNOSIS — Z951 Presence of aortocoronary bypass graft: Secondary | ICD-10-CM | POA: Insufficient documentation

## 2018-09-08 DIAGNOSIS — I517 Cardiomegaly: Secondary | ICD-10-CM | POA: Insufficient documentation

## 2018-09-08 DIAGNOSIS — R590 Localized enlarged lymph nodes: Secondary | ICD-10-CM | POA: Insufficient documentation

## 2018-09-08 DIAGNOSIS — C641 Malignant neoplasm of right kidney, except renal pelvis: Secondary | ICD-10-CM | POA: Diagnosis not present

## 2018-09-08 DIAGNOSIS — R911 Solitary pulmonary nodule: Secondary | ICD-10-CM | POA: Insufficient documentation

## 2018-09-12 DIAGNOSIS — C641 Malignant neoplasm of right kidney, except renal pelvis: Secondary | ICD-10-CM | POA: Diagnosis not present

## 2018-09-20 ENCOUNTER — Encounter: Payer: Self-pay | Admitting: Internal Medicine

## 2018-09-20 ENCOUNTER — Ambulatory Visit (INDEPENDENT_AMBULATORY_CARE_PROVIDER_SITE_OTHER): Payer: Medicare Other | Admitting: Internal Medicine

## 2018-09-20 VITALS — BP 134/68 | HR 52 | Ht 69.0 in | Wt 194.2 lb

## 2018-09-20 DIAGNOSIS — N183 Chronic kidney disease, stage 3 unspecified: Secondary | ICD-10-CM

## 2018-09-20 DIAGNOSIS — I5032 Chronic diastolic (congestive) heart failure: Secondary | ICD-10-CM

## 2018-09-20 DIAGNOSIS — I10 Essential (primary) hypertension: Secondary | ICD-10-CM

## 2018-09-20 DIAGNOSIS — Z951 Presence of aortocoronary bypass graft: Secondary | ICD-10-CM

## 2018-09-20 DIAGNOSIS — I25810 Atherosclerosis of coronary artery bypass graft(s) without angina pectoris: Secondary | ICD-10-CM | POA: Diagnosis not present

## 2018-09-20 DIAGNOSIS — Z9989 Dependence on other enabling machines and devices: Secondary | ICD-10-CM | POA: Diagnosis not present

## 2018-09-20 DIAGNOSIS — G4733 Obstructive sleep apnea (adult) (pediatric): Secondary | ICD-10-CM

## 2018-09-20 NOTE — Progress Notes (Signed)
OFFICE NOTE  Chief Complaint:  Shortness of breath  Primary Care Physician: [name], MD  HPI:  [name] is a pleasant gentleman who I previously saw in 2014. He has a history of coronary artery disease status post CABG in 1996. He also has chronic kidney disease with a creatinine that was around 1.8. In the past he seen Dr. Moshe Cipro, however she no longer sees him. He denies any chest pain or worsening shortness of breath. He reports good blood pressure control on his current medicines. He is somewhat active but does not exercise regularly. His primary care provider has been supplying his medications however we have prescribed him in the past for nadolol. He was noted in the past to have a right bundle branch block was which was new. His last stress test was in 2012 which was negative for ischemia.  03/22/2017  Mr. Mendiola returns today for hospital follow-up. I have not seen him since 2016. He has a history of coronary artery disease status post CABG in 1996 as well as chronic kidney disease is had a creatinine is elevated is 1.8 in the past but apparently that improved. He was previously seen Dr. Moshe Cipro. He does have a history of chronic cholecystitis and is part of her workup for that was found to have renal cell cancer. In March she was admitted for surgery and underwent robotic-assisted right partial nephrectomy for a renal mass and combined cholecystectomy. He had difficulty in oxygenation after surgery and was monitored in the ICU for hypoxia. It was felt that there was an element of diastolic congestive heart failure. An echocardiogram was performed on 02/20/2017 which showed an EF of 01-02%, grade 2 diastolic dysfunction and moderate pulmonary hypertension with an RVSP of 61 mmHg. He underwent a subsequent limited echo on 03/09/2017 which showed an EF of 72-53%, grade 2 diastolic function, however there was an increase in RVSP to 85 mmHg suggestive of severe pulmonary  hypertension and there was a moderate localized pericardial effusion around the inferior and inferolateral walls without signs of tamponade physiology. Mr. Chaffin is had persistent shortness of breath and hypoxia. He was sent home on oxygen and recently noted in his urologist office to be hypoxic with an O2 sat duration of 87% on room air.  04/08/2017  Mr. Fenlon was seen today in follow-up. Since I last saw him and put him on diuretics his weight has come down from 204 pounds 195 pounds. He reports improvement in his breathing. We repeated an echocardiogram which shows a stable LV EF 55-60% and resolution of pericardial effusion. Of note he is RVSP is decreased to 46 mmHg, and previously was 61 mmHg. This suggests he may have had some pulmonary venous hypertension. He was also placed on oxygen after seeing Dr. Lake Bells in Pulmonary. A VQ scan was negative for pulmonary emboli.   07/26/2017  Mr. Larue returns for follow-up. He is doing exceedingly well. His previous dimension his EF has normalized. He's maintained a stable weight on twice daily Lasix. He's followed up with Dr. Lake Bells who has diagnosed him with obstructive sleep apnea. He has an upcoming CPAP titration. He is on oxygen, mostly at night and with exertion. In general he feels much better. Creatinine has also improved. Recent labs from his primary care provider were also all within normal limits.  02/10/2018  Mr. Jablon was seen today in follow-up.  Overall he is without complaints.  He is on oxygen chronically.  He denies any chest  pain or worsening shortness of breath.  He had recent lab work in December 2018 which showed a serum creatinine at 2.03.  His creatinine had been much higher in the past.  A lipid profile last in July 2018 showed total cholesterol 112, HDL 35, LDL 62 and triglycerides 77.  Hemoglobin A1c was increased up to 8.4.  As previously followed by Dr. Wilson Singer, but after his retirement has been seeing Dr. Maudie Mercury.  His insulin regimen was  recently switched, including changing him over to Lantus insulin 35 units at night.  05/06/2018  Mr. Mcdill returns today for follow-up.  Recently underwent a CT scan in preparation for follow-up with Dr. Lake Bells.  He was noted to have bilateral pleural effusions which have worsened.  This is associated with worsening shortness of breath.  Blood pressure was elevated today 170/84.  EKG shows sinus bradycardia at 58.  Lab work shows his serum creatinine is elevated up to 1.8, but had been as low as 1.5 recently.  Total cholesterol is 107, HDL 32, LDL 60 and triglycerides 73.  He says his shortness of breath is worse with exertion, particularly walking up stairs or carrying items.  He denies any chest pain.  Of note he had history of cardiomyopathy however LVEF had normalized as of April 2018.  09/20/2018  Mr. Legate is seen today in follow-up.  He is doing very well.  He says his shortness of breath is very good.  His weight is maintained about 10 pounds lighter than he had been back in June at which time we had increased his diuretics.  He had been seen by Almyra Deforest, PA-C, who further increased his diuretics and he seems to be doing well with this now.  Blood pressure is well controlled.  PMHx:  Past Medical History:  Diagnosis Date  . Chronic kidney disease    tumor right kidney  . Coronary artery disease   . Diabetes mellitus    type 2  . Hypertension   . Myocardial infarction ([place])    1997  . Right renal mass     Past Surgical History:  Procedure Laterality Date  . CARDIAC CATHETERIZATION  1996  . CORONARY ARTERY BYPASS GRAFT    . DOPPLER ECHOCARDIOGRAPHY  2011  . HERNIA REPAIR    . IR GENERIC HISTORICAL  12/09/2016   IR RADIOLOGIST EVAL & MGMT 12/09/2016 [name], MD [facility] INTERV RAD  . ROBOTIC ASSITED PARTIAL NEPHRECTOMY Right 02/19/2017   Procedure: XI ROBOTIC ASSITED PARTIAL NEPHRECTOMY;  Surgeon: [name], MD;  Location: [facility];  Service: Urology;  Laterality: Right;     FAMHx:  Family History  Problem Relation Age of Onset  . Diabetes Mother   . Diabetes Sister     SOCHx:   reports that he quit smoking about 21 years ago. His smoking use included cigarettes. He has a 13.50 pack-year smoking history. He has never used smokeless tobacco. He reports that he does not drink alcohol or use drugs.  ALLERGIES:  No Known Allergies  ROS: Pertinent items noted in HPI and remainder of comprehensive ROS otherwise negative.  HOME MEDS: Current Outpatient Medications  Medication Sig Dispense Refill  . acetaminophen (TYLENOL) 325 MG tablet Take 650 mg by mouth every 6 (six) hours as needed (for pain.).    Marland Kitchen amLODipine (NORVASC) 5 MG tablet Take 5 mg by mouth daily.   0  . aspirin 81 MG EC tablet Take 81 mg by mouth daily. Swallow whole.    Marland Kitchen  benazepril (LOTENSIN) 40 MG tablet Take 40 mg by mouth daily.  0  . cloNIDine (CATAPRES) 0.1 MG tablet Take 0.1 mg by mouth 2 (two) times daily.    Marland Kitchen ezetimibe (ZETIA) 10 MG tablet Take 10 mg by mouth daily.    . furosemide (LASIX) 40 MG tablet TAKE 3 TABLETS(120 MG) BY MOUTH TWICE DAILY 180 tablet 1  . HUMALOG KWIKPEN 100 UNIT/ML KiwkPen Inject 10 Units into the skin daily with supper.   0  . LEVEMIR 100 UNIT/ML injection Inject 30 Units into the skin 2 (two) times daily.   11  . nadolol (CORGARD) 40 MG tablet Take 40 mg by mouth daily.    . potassium chloride (K-DUR) 10 MEQ tablet TAKE 2 TABLETS(20 MEQ) BY MOUTH TWICE DAILY 360 tablet 1  . rosuvastatin (CRESTOR) 20 MG tablet TAKE 1 TABLET BY MOUTH EVERY DAY 90 tablet 1  . SYNTHROID 100 MCG tablet Take 100 mcg by mouth daily.   0   No current facility-administered medications for this visit.     LABS/IMAGING: No results found for this or any previous visit (from the past 48 hour(s)). No results found.  WEIGHTS: Wt Readings from Last 3 Encounters:  09/20/18 194 lb 3.2 oz (88.1 kg)  06/16/18 193 lb 3.2 oz (87.6 kg)  05/26/18 203 lb (92.1 kg)    VITALS: BP  134/68   Pulse (!) 52   Ht 5\' 9"  (1.753 m)   Wt 194 lb 3.2 oz (88.1 kg)   BMI 28.68 kg/m   EXAM: General appearance: alert, no distress and on oxygen Neck: no carotid bruit and no JVD Lungs: clear to auscultation bilaterally Heart: regular rate and rhythm, S1, S2 normal, no murmur, click, rub or gallop Abdomen: soft, non-tender; bowel sounds normal; no masses,  no organomegaly Extremities: extremities normal, atraumatic, no cyanosis or edema Pulses: 2+ and symmetric Skin: Skin color, texture, turgor normal. No rashes or lesions Neurologic: Grossly normal Psych: Normal, but did not recall seeing a pulmonologist  EKG: Sinus bradycardia with PACs, poor R wave progression and lateral T wave changes-personally reviewed  ASSESSMENT:  1. Chronic diastolic congestive heart failure with pleural effusions (EF 55-60%, 03/2017) 2. Mixed pulmonary venous/arterial hypertension 3. Moderate sized pericardial effusion - resolved 4. CAD status post CABG 1996 5. CKD with improving creatinine now 1.54  (as high as 3.84, 2.87, 2.36 recently) 6. Hypertension 7. Dyslipidemia 8. RBBB 9. OSA on CPAP  PLAN: 1.    Mr. Haris seems to be stable with chronic diastolic congestive heart failure.  He is on high-dose diuretic but he is maintaining euvolemic status.  His weight is remained stable around 193-194 pounds.  Creatinine is also improved.  Plan to continue his current dose of diuretics.  Follow-up in 6 months.  Pixie Casino, MD, [facility], [place] Director of the Advanced Lipid Disorders &  Cardiovascular Risk Reduction Clinic Diplomate of the American Board of Clinical Lipidology Attending Cardiologist  Direct Dial: [phone]  Fax: [phone]  Website:  [email or web address] 09/20/2018, 11:21 AM

## 2018-09-20 NOTE — Patient Instructions (Signed)
Medication Instructions:  Continue current medications If you need a refill on your cardiac medications before your next appointment, please call your pharmacy.   Lab work: NONE  Testing/Procedures: NONE  Follow-Up: At [facility], you and your health needs are our priority.  As part of our continuing mission to provide you with exceptional heart care, we have created designated Provider Care Teams.  These Care Teams include your primary Cardiologist (physician) and Advanced Practice Providers (APPs -  Physician Assistants and Nurse Practitioners) who all work together to provide you with the care you need, when you need it. You will need a follow up appointment in 6 months.  Please call our office 2 months in advance to schedule this appointment.  You may see Dr. Debara Pickett or one of the following Advanced Practice Providers on your designated Care Team: Almyra Deforest, [place] . Fabian Sharp, PA-C  Any Other Special Instructions Will Be Listed Below (If Applicable).

## 2018-09-21 NOTE — Addendum Note (Signed)
Addended by: Crissie Reese on: 09/21/2018 03:31 PM   Modules accepted: Orders

## 2018-09-23 ENCOUNTER — Other Ambulatory Visit: Payer: Self-pay | Admitting: Physician Assistant

## 2018-09-23 ENCOUNTER — Other Ambulatory Visit: Payer: Self-pay | Admitting: Internal Medicine

## 2018-09-23 ENCOUNTER — Telehealth: Payer: Self-pay | Admitting: Internal Medicine

## 2018-09-23 NOTE — Telephone Encounter (Signed)
New Message         Patient states that his "Furosemide" was not at the pharmacy.        *STAT* If patient is at the pharmacy, call can be transferred to refill team.   1. Which medications need to be refilled? (please list name of each medication and dose if known) furosemide (LASIX) 40 MG tablet  2. Which pharmacy/location (including street and city if local pharmacy) is medication to be sent to     [place], [address] [phone] (Phone) [phone] (Fax)     3. Do they need a 30 day or 90 day supply? Patient states he takes 6 pills a day 3 in AM and 3 in PM patient will need more than 90 pills to get him to his next appt.

## 2018-09-23 NOTE — Telephone Encounter (Signed)
Medication sent to pharmacy, 180 pills, per pharmacy that's how many he should get.

## 2018-10-19 DIAGNOSIS — R748 Abnormal levels of other serum enzymes: Secondary | ICD-10-CM | POA: Diagnosis not present

## 2018-10-19 DIAGNOSIS — E118 Type 2 diabetes mellitus with unspecified complications: Secondary | ICD-10-CM | POA: Diagnosis not present

## 2018-10-19 DIAGNOSIS — E039 Hypothyroidism, unspecified: Secondary | ICD-10-CM | POA: Diagnosis not present

## 2018-10-19 DIAGNOSIS — I10 Essential (primary) hypertension: Secondary | ICD-10-CM | POA: Diagnosis not present

## 2018-10-26 DIAGNOSIS — I25111 Atherosclerotic heart disease of native coronary artery with angina pectoris with documented spasm: Secondary | ICD-10-CM | POA: Diagnosis not present

## 2018-10-26 DIAGNOSIS — E1165 Type 2 diabetes mellitus with hyperglycemia: Secondary | ICD-10-CM | POA: Diagnosis not present

## 2018-10-26 DIAGNOSIS — E782 Mixed hyperlipidemia: Secondary | ICD-10-CM | POA: Diagnosis not present

## 2018-10-26 DIAGNOSIS — I10 Essential (primary) hypertension: Secondary | ICD-10-CM | POA: Diagnosis not present

## 2018-10-26 DIAGNOSIS — Z23 Encounter for immunization: Secondary | ICD-10-CM | POA: Diagnosis not present

## 2018-10-26 DIAGNOSIS — N183 Chronic kidney disease, stage 3 (moderate): Secondary | ICD-10-CM | POA: Diagnosis not present

## 2018-10-26 DIAGNOSIS — E039 Hypothyroidism, unspecified: Secondary | ICD-10-CM | POA: Diagnosis not present

## 2018-11-28 DIAGNOSIS — E118 Type 2 diabetes mellitus with unspecified complications: Secondary | ICD-10-CM | POA: Diagnosis not present

## 2018-12-05 DIAGNOSIS — I10 Essential (primary) hypertension: Secondary | ICD-10-CM | POA: Diagnosis not present

## 2018-12-05 DIAGNOSIS — E118 Type 2 diabetes mellitus with unspecified complications: Secondary | ICD-10-CM | POA: Diagnosis not present

## 2018-12-05 DIAGNOSIS — E789 Disorder of lipoprotein metabolism, unspecified: Secondary | ICD-10-CM | POA: Diagnosis not present

## 2018-12-05 DIAGNOSIS — E039 Hypothyroidism, unspecified: Secondary | ICD-10-CM | POA: Diagnosis not present

## 2018-12-05 DIAGNOSIS — E1165 Type 2 diabetes mellitus with hyperglycemia: Secondary | ICD-10-CM | POA: Diagnosis not present

## 2019-02-24 ENCOUNTER — Other Ambulatory Visit: Payer: Self-pay | Admitting: *Deleted

## 2019-02-24 MED ORDER — POTASSIUM CHLORIDE ER 10 MEQ PO TBCR: 20.0000 meq | EXTENDED_RELEASE_TABLET | Freq: Two times a day (BID) | ORAL | 0 refills | Status: DC

## 2019-02-24 NOTE — Telephone Encounter (Signed)
Rx has been sent to the pharmacy electronically. ° °

## 2019-03-08 DIAGNOSIS — E039 Hypothyroidism, unspecified: Secondary | ICD-10-CM | POA: Diagnosis not present

## 2019-03-08 DIAGNOSIS — E118 Type 2 diabetes mellitus with unspecified complications: Secondary | ICD-10-CM | POA: Diagnosis not present

## 2019-03-08 DIAGNOSIS — E1165 Type 2 diabetes mellitus with hyperglycemia: Secondary | ICD-10-CM | POA: Diagnosis not present

## 2019-03-08 DIAGNOSIS — E789 Disorder of lipoprotein metabolism, unspecified: Secondary | ICD-10-CM | POA: Diagnosis not present

## 2019-03-08 DIAGNOSIS — Z6829 Body mass index (BMI) 29.0-29.9, adult: Secondary | ICD-10-CM | POA: Diagnosis not present

## 2019-03-08 DIAGNOSIS — I10 Essential (primary) hypertension: Secondary | ICD-10-CM | POA: Diagnosis not present

## 2019-03-14 ENCOUNTER — Telehealth: Payer: Self-pay

## 2019-03-14 ENCOUNTER — Telehealth: Payer: Self-pay | Admitting: Internal Medicine

## 2019-03-14 DIAGNOSIS — C642 Malignant neoplasm of left kidney, except renal pelvis: Secondary | ICD-10-CM | POA: Diagnosis not present

## 2019-03-14 NOTE — Telephone Encounter (Signed)
Virtual Visit Pre-Appointment Phone Call  [name] has been deemed a candidate for a follow-up tele-health visit to limit community exposure during the Covid-19 pandemic. I spoke with the patient via phone to ensure availability of phone/video source, confirm preferred email & phone number, and discuss instructions and expectations.  I reminded [name] to be prepared with any vital sign and/or heart rhythm information that could potentially be obtained via home monitoring, at the time of his visit. I reminded [name] to expect a phone call at the time of his visit if his visit.  Did the patient verbally acknowledge consent to treatment? Patient provided verbal consent.  , [name], [name] 03/14/2019 11:18 AM   DOWNLOADING THE WEBEX SOFTWARE TO SMARTPHONE  - If Apple, go to [employer] and type in WebEx in the search bar. [employer], the blue/green circle. The app is free but as with any other app downloads, their phone may require them to verify saved payment information or Apple password. The patient does NOT have to create an account.  - If Android, ask patient to go to [employer] and type in WebEx in the search bar. [place] [employer], the blue/green circle. The app is free but as with any other app downloads, their phone may require them to verify saved payment information or Android password. The patient does NOT have to create an account.   CONSENT FOR TELE-HEALTH VISIT - PLEASE REVIEW  I hereby voluntarily request, consent and authorize [facility] and its employed or contracted physicians, physician assistants, nurse practitioners or other licensed health care professionals (the Practitioner), to provide me with telemedicine health care services (the "Services") as deemed necessary by the treating Practitioner. I acknowledge and consent to receive the Services by the Practitioner via telemedicine. I understand that the  telemedicine visit will involve communicating with the Practitioner through live audiovisual communication technology and the disclosure of certain medical information by electronic transmission. I acknowledge that I have been given the opportunity to request an in-person assessment or other available alternative prior to the telemedicine visit and am voluntarily participating in the telemedicine visit.  I understand that I have the right to withhold or withdraw my consent to the use of telemedicine in the course of my care at any time, without affecting my right to future care or treatment, and that the Practitioner or I may terminate the telemedicine visit at any time. I understand that I have the right to inspect all information obtained and/or recorded in the course of the telemedicine visit and may receive copies of available information for a reasonable fee.  I understand that some of the potential risks of receiving the Services via telemedicine include:  Marland Kitchen Delay or interruption in medical evaluation due to technological equipment failure or disruption; . Information transmitted may not be sufficient (e.g. poor resolution of images) to allow for appropriate medical decision making by the Practitioner; and/or  . In rare instances, security protocols could fail, causing a breach of personal health information.  Furthermore, I acknowledge that it is my responsibility to provide information about my medical history, conditions and care that is complete and accurate to the best of my ability. I acknowledge that Practitioner's advice, recommendations, and/or decision may be based on factors not within their control, such as incomplete or inaccurate data provided by me or distortions of diagnostic images or specimens that may result from electronic transmissions. I understand that the practice  of medicine is not an Chief Strategy Officer and that Practitioner makes no warranties or guarantees regarding treatment  outcomes. I acknowledge that I will receive a copy of this consent concurrently upon execution via email to the email address I last provided but may also request a printed copy by calling the office of [facility].    I understand that my insurance will be billed for this visit.   I have read or had this consent read to me. . I understand the contents of this consent, which adequately explains the benefits and risks of the Services being provided via telemedicine.  . I have been provided ample opportunity to ask questions regarding this consent and the Services and have had my questions answered to my satisfaction. . I give my informed consent for the services to be provided through the use of telemedicine in my medical care  By participating in this telemedicine visit I agree to the above.

## 2019-03-14 NOTE — Telephone Encounter (Signed)
Left message for patient.  He needs to be pre-registered  his for his tele appt tomorrow.

## 2019-03-15 ENCOUNTER — Telehealth (INDEPENDENT_AMBULATORY_CARE_PROVIDER_SITE_OTHER): Payer: Medicare Other | Admitting: Internal Medicine

## 2019-03-15 ENCOUNTER — Encounter: Payer: Self-pay | Admitting: Internal Medicine

## 2019-03-15 VITALS — BP 183/87 | HR 52 | Ht 69.0 in | Wt 194.4 lb

## 2019-03-15 DIAGNOSIS — E785 Hyperlipidemia, unspecified: Secondary | ICD-10-CM

## 2019-03-15 DIAGNOSIS — I10 Essential (primary) hypertension: Secondary | ICD-10-CM | POA: Diagnosis not present

## 2019-03-15 DIAGNOSIS — I5032 Chronic diastolic (congestive) heart failure: Secondary | ICD-10-CM

## 2019-03-15 DIAGNOSIS — I2510 Atherosclerotic heart disease of native coronary artery without angina pectoris: Secondary | ICD-10-CM

## 2019-03-15 DIAGNOSIS — E782 Mixed hyperlipidemia: Secondary | ICD-10-CM

## 2019-03-15 DIAGNOSIS — G4733 Obstructive sleep apnea (adult) (pediatric): Secondary | ICD-10-CM

## 2019-03-15 DIAGNOSIS — Z951 Presence of aortocoronary bypass graft: Secondary | ICD-10-CM

## 2019-03-15 DIAGNOSIS — Z9989 Dependence on other enabling machines and devices: Secondary | ICD-10-CM

## 2019-03-15 MED ORDER — FUROSEMIDE 20 MG PO TABS: 60.0000 mg | ORAL_TABLET | Freq: Two times a day (BID) | ORAL | 11 refills | Status: DC

## 2019-03-15 NOTE — Progress Notes (Signed)
Virtual Visit via Telephone Note   This visit type was conducted due to national recommendations for restrictions regarding the COVID-19 Pandemic (e.g. social distancing) in an effort to limit this patient's exposure and mitigate transmission in our community.  Due to his co-morbid illnesses, this patient is at least at moderate risk for complications without adequate follow up.  This format is felt to be most appropriate for this patient at this time.  The patient did not have access to video technology/had technical difficulties with video requiring transitioning to audio format only (telephone).  All issues noted in this document were discussed and addressed.  No physical exam could be performed with this format.  Please refer to the patient's chart for his  consent to telehealth for [facility].   Evaluation Performed:  Routine office visit  Date:  03/15/2019   ID:  [name], DOB 1932-12-07, MRN [id number]  Patient Location:  [place] [address]  Provider location:   [address], [place], [place] [address]  PCP:  [name], MD  Cardiologist:  No primary care provider on file. Electrophysiologist:  None   Chief Complaint:  No complaints  History of Present Illness:    [name] is a 85 y.o. male who presents via audio/video conferencing for a telehealth visit today.  [name] is a pleasant 85 year old male patient I been following for some time with a history of chronic diastolic heart failure and bilateral pleural effusions.  LVEF is remained stable around 55 to 60%.  He also has a history of mixed pulmonary venous and arterial hypertension and has been seen in pulmonary by Dr. Lake Bells.  He had a moderate sized right pleural effusion which resolved after more aggressive diuresis.  He also has a history of coronary disease and had CABG in 1996.  In addition he had chronic kidney disease however this is improved with creatinine now ranging between  1.5-1.7.  There is a history of hypertension, dyslipidemia, right bundle branch block and obstructive sleep apnea, which he says he is compliant on nightly CPAP.  When I last saw him 6 months ago his weight is unchanged from then.  He told me that he has no new complaints.  He denies chest pain, worsening shortness of breath or other associated symptoms.  He was mowing his lawn today.  He says he can mow his lawn for at least a half an hour.  He says he typically breaks it up in half because of the heat, but does not have any worsening shortness of breath or heart failure symptoms with this.  The patient does not have symptoms concerning for COVID-19 infection (fever, chills, cough, or new SHORTNESS OF BREATH).    Prior CV studies:   The following studies were reviewed today:  Labs from November 2019  PMHx:  Past Medical History:  Diagnosis Date   Chronic kidney disease    tumor right kidney   Coronary artery disease    Diabetes mellitus    type 2   Hypertension    Myocardial infarction [facility])    1997   Right renal mass     Past Surgical History:  Procedure Laterality Date   CARDIAC CATHETERIZATION  1996   CORONARY ARTERY BYPASS GRAFT     DOPPLER ECHOCARDIOGRAPHY  2011   HERNIA REPAIR     IR GENERIC HISTORICAL  12/09/2016   IR RADIOLOGIST EVAL & MGMT 12/09/2016 [name], MD [facility] INTERV RAD  ROBOTIC ASSITED PARTIAL NEPHRECTOMY Right 02/19/2017   Procedure: XI ROBOTIC ASSITED PARTIAL NEPHRECTOMY;  Surgeon: [name], MD;  Location: [facility] OR[facility];  Service: Urology;  Laterality: Right;    FAMHx:  Family History  Problem Relation Age of Onset   Diabetes Mother    Diabetes Sister     SOCHx:   reports that he quit smoking about 22 years ago. His smoking use included cigarettes. He has a 13.50 pack-year smoking history. He has never used smokeless tobacco. He reports that he does not drink alcohol or use drugs.  ALLERGIES:  No Known Allergies  MEDS:  Current  Meds  Medication Sig   acetaminophen (TYLENOL) 325 MG tablet Take 650 mg by mouth every 6 (six) hours as needed (for pain.).   amLODipine (NORVASC) 5 MG tablet Take 5 mg by mouth daily.    aspirin 81 MG EC tablet Take 81 mg by mouth daily. Swallow whole.   benazepril (LOTENSIN) 40 MG tablet Take 40 mg by mouth daily.   cloNIDine (CATAPRES) 0.1 MG tablet Take 0.1 mg by mouth 2 (two) times daily.   ezetimibe (ZETIA) 10 MG tablet Take 10 mg by mouth daily.   furosemide (LASIX) 20 MG tablet Take 3 tablets (60 mg total) by mouth 2 (two) times daily.   HUMALOG KWIKPEN 100 UNIT/ML KiwkPen Inject 6-8 Units into the skin 3 (three) times daily.    LANTUS SOLOSTAR 100 UNIT/ML Solostar Pen Inject 23 Units into the skin every evening.   nadolol (CORGARD) 40 MG tablet Take 40 mg by mouth daily.   potassium chloride (K-DUR) 10 MEQ tablet Take 2 tablets (20 mEq total) by mouth 2 (two) times daily.   rosuvastatin (CRESTOR) 20 MG tablet TAKE 1 TABLET BY MOUTH EVERY DAY   SYNTHROID 100 MCG tablet Take 100 mcg by mouth daily.    [DISCONTINUED] furosemide (LASIX) 40 MG tablet TAKE 3 TABLETS(120 MG) BY MOUTH TWICE DAILY     ROS: Pertinent items noted in HPI and remainder of comprehensive ROS otherwise negative.  Labs/Other Tests and Data Reviewed:    Recent Labs: 05/10/2018: Pro B Natriuretic peptide (BNP) 875.0 06/16/2018: BUN 21; Creatinine, Ser 1.72; Potassium 4.4; Sodium 145   Recent Lipid Panel Lab Results  Component Value Date/Time   CHOL  06/27/2010 03:42 AM    130        ATP III CLASSIFICATION:  <200     mg/dL   Desirable  200-239  mg/dL   Borderline High  >=240    mg/dL   High          TRIG 107 06/27/2010 03:42 AM   HDL 44 06/27/2010 03:42 AM   CHOLHDL 3.0 06/27/2010 03:42 AM   LDLCALC  06/27/2010 03:42 AM    65        Total Cholesterol/HDL:CHD Risk Coronary Heart Disease Risk Table                     Men   Women  1/2 Average Risk   3.4   3.3  Average Risk       5.0    4.4  2 X Average Risk   9.6   7.1  3 X Average Risk  23.4   11.0        Use the calculated Patient Ratio above and the CHD Risk Table to determine the patient's CHD Risk.        ATP III CLASSIFICATION (LDL):  <100  mg/dL   Optimal  100-129  mg/dL   Near or Above                    Optimal  130-159  mg/dL   Borderline  160-189  mg/dL   High  >190     mg/dL   Very High    Wt Readings from Last 3 Encounters:  03/15/19 194 lb 6.4 oz (88.2 kg)  09/20/18 194 lb 3.2 oz (88.1 kg)  06/16/18 193 lb 3.2 oz (87.6 kg)     Exam:    Vital Signs:  BP (!) 183/87    Pulse (!) 52    Ht 5\' 9"  (1.753 m)    Wt 194 lb 6.4 oz (88.2 kg)    BMI 28.71 kg/m    Exam deferred due to telephone visit  ASSESSMENT & PLAN:    1. Chronic diastolic congestive heart failure with pleural effusions (EF 55-60%, 03/2017) 2. Mixed pulmonary venous/arterial hypertension 3. Moderate sized pericardial effusion - resolved 4. CAD status post CABG 1996 5. CKD with improving creatinine now 1.54  (as high as 3.84, 2.87, 2.36 recently) 6. Hypertension 7. Dyslipidemia 8. RBBB 9. OSA on CPAP  [name] is doing well with stable diastolic heart failure and no evidence of worsening shortness of breath or chest pain.  He has no anginal symptoms and is able to mow his lawn which she did this morning without incident.  Weight has been stable.  His creatinine has been stable on his current dose of diuretics.  He says that he will need a refill of his medication which will put through.  He reports compliance with CPAP.  LDL is at goal less than 70.  Blood pressure was elevated today however he took it right after coming in from mowing his lawn.  Otherwise it generally runs about 400 systolic.  No further changes to his medicines today.  Plan follow-up with me in 6 months or sooner as necessary.  COVID-19 Education: The signs and symptoms of COVID-19 were discussed with the patient and how to seek care for testing (follow up with  PCP or arrange E-visit).  The importance of social distancing was discussed today.  Patient Risk:   After full review of this patients clinical status, I feel that they are at least moderate risk at this time.  Time:   Today, I have spent 25 minutes with the patient with telehealth technology discussing diastolic heart failure, coronary artery disease, chronic kidney disease, labs, blood pressure, cholesterol, and obstructive sleep apnea..     Medication Adjustments/Labs and Tests Ordered: Current medicines are reviewed at length with the patient today.  Concerns regarding medicines are outlined above.   Tests Ordered: No orders of the defined types were placed in this encounter.   Medication Changes: Meds ordered this encounter  Medications   furosemide (LASIX) 20 MG tablet    Sig: Take 3 tablets (60 mg total) by mouth 2 (two) times daily.    Dispense:  180 tablet    Refill:  11    **Patient requests 90 days supply**    Disposition:  in 6 month(s)  Pixie Casino, MD, [facility], [place] Director of the Advanced Lipid Disorders &  Cardiovascular Risk Reduction Clinic Diplomate of the American Board of Clinical Lipidology Attending Cardiologist  Direct Dial: [phone]   Fax: [phone]  Website:  [email or web address]  Pixie Casino, MD  03/15/2019 3:20 PM

## 2019-03-15 NOTE — Patient Instructions (Addendum)
Medication Instructions:  Continue current medications  If you need a refill on your cardiac medications before your next appointment, please call your pharmacy.  Labwork: None Ordered   Testing/Procedures: None Ordered   Follow-Up: .    Your physician recommends that you schedule a follow-up appointment in: 6 Months with Dr Debara Pickett   At [facility], you and your health needs are our priority.  As part of our continuing mission to provide you with exceptional heart care, we have created designated Provider Care Teams.  These Care Teams include your primary Cardiologist (physician) and Advanced Practice Providers (APPs -  Physician Assistants and Nurse Practitioners) who all work together to provide you with the care you need, when you need it.  Thank you for choosing [facility] at [facility]!!

## 2019-03-20 DIAGNOSIS — C641 Malignant neoplasm of right kidney, except renal pelvis: Secondary | ICD-10-CM | POA: Diagnosis not present

## 2019-03-21 ENCOUNTER — Ambulatory Visit: Payer: Medicare Other | Admitting: Internal Medicine

## 2019-04-27 DIAGNOSIS — E789 Disorder of lipoprotein metabolism, unspecified: Secondary | ICD-10-CM | POA: Diagnosis not present

## 2019-04-27 DIAGNOSIS — E1165 Type 2 diabetes mellitus with hyperglycemia: Secondary | ICD-10-CM | POA: Diagnosis not present

## 2019-04-27 DIAGNOSIS — E038 Other specified hypothyroidism: Secondary | ICD-10-CM | POA: Diagnosis not present

## 2019-04-27 DIAGNOSIS — I10 Essential (primary) hypertension: Secondary | ICD-10-CM | POA: Diagnosis not present

## 2019-05-04 ENCOUNTER — Other Ambulatory Visit: Payer: Self-pay | Admitting: Internal Medicine

## 2019-05-04 DIAGNOSIS — E118 Type 2 diabetes mellitus with unspecified complications: Secondary | ICD-10-CM | POA: Diagnosis not present

## 2019-05-04 DIAGNOSIS — Z0000 Encounter for general adult medical examination without abnormal findings: Secondary | ICD-10-CM | POA: Diagnosis not present

## 2019-05-04 DIAGNOSIS — E782 Mixed hyperlipidemia: Secondary | ICD-10-CM | POA: Diagnosis not present

## 2019-05-04 DIAGNOSIS — E039 Hypothyroidism, unspecified: Secondary | ICD-10-CM | POA: Diagnosis not present

## 2019-05-04 DIAGNOSIS — I10 Essential (primary) hypertension: Secondary | ICD-10-CM | POA: Diagnosis not present

## 2019-05-15 ENCOUNTER — Ambulatory Visit (INDEPENDENT_AMBULATORY_CARE_PROVIDER_SITE_OTHER)
Admission: RE | Admit: 2019-05-15 | Discharge: 2019-05-15 | Disposition: A | Discharge status: Home / Self Care | Payer: Medicare Other | Source: Ambulatory Visit | Attending: Adult Health | Admitting: Adult Health

## 2019-05-15 ENCOUNTER — Encounter: Payer: Self-pay | Admitting: Adult Health

## 2019-05-15 ENCOUNTER — Other Ambulatory Visit: Payer: Self-pay | Admitting: General Surgery

## 2019-05-15 ENCOUNTER — Ambulatory Visit (INDEPENDENT_AMBULATORY_CARE_PROVIDER_SITE_OTHER): Payer: Medicare Other | Admitting: Adult Health

## 2019-05-15 ENCOUNTER — Other Ambulatory Visit: Payer: Self-pay

## 2019-05-15 VITALS — HR 65 | Temp 97.6°F | Ht 69.0 in | Wt 206.6 lb

## 2019-05-15 DIAGNOSIS — G4733 Obstructive sleep apnea (adult) (pediatric): Secondary | ICD-10-CM | POA: Diagnosis not present

## 2019-05-15 DIAGNOSIS — I2510 Atherosclerotic heart disease of native coronary artery without angina pectoris: Secondary | ICD-10-CM | POA: Diagnosis not present

## 2019-05-15 DIAGNOSIS — I5033 Acute on chronic diastolic (congestive) heart failure: Secondary | ICD-10-CM | POA: Diagnosis not present

## 2019-05-15 DIAGNOSIS — J432 Centrilobular emphysema: Secondary | ICD-10-CM | POA: Diagnosis not present

## 2019-05-15 DIAGNOSIS — R0602 Shortness of breath: Secondary | ICD-10-CM | POA: Diagnosis not present

## 2019-05-15 NOTE — Patient Instructions (Addendum)
Need to change CPAP Mask . Order for new mask sent to DME along with rest of your supplies .  Change CPAP to 5 to 15 auto set .  CPAP download in 4 weeks - see up televisit to review this.  Continue on lasix .  Low salt diet  Chest xray today .  Follow up in 4 months and As needed   Please contact office for sooner follow up if symptoms do not improve or worsen or seek emergency care

## 2019-05-15 NOTE — Progress Notes (Signed)
_0  ID: [name], male    DOB: [birth year], 85 y.o.   MRN: [id number]  Chief Complaint  Patient presents with  . Follow-up    OSA     Referring provider: [name], MD  HPI: 85 year old male followed for emphysema, obstructive sleep apnea and pulmonary hypertension  TEST/EVENTS :  April 2018 pulmonary function testing: Ratio normal, FEV1 2.18 L 85% predicted, 14% change post bronchodilator, forced vital capacity 2.63 L 74% predicted, total lung capacity 4.80 L 68% predicted, ERV 35% predicted, DLCO 8.4 mL 26% predicted  Cardiac imaging: January 2018 nuclear stress test showed an LVEF of 30-44%, low risk study with normal perfusion.  Echo: March 2018 echocardiogram showed an estimated PA pressure of 61 mmHg April 2018 echocardiogram showed an LVEF of 60-65% with grade 2 diastolic dysfunction, moderate concentric left ventricular hypertrophy, left atrium was moderately dilated, pulmonary artery systolic pressure estimate 85 mmHg, right ventricle cavity size normal with normal systolic function, there is a moderate sized pericardial effusion  Imaging: March 2018 chest x-ray images independently reviewed showing bilateral hazy airspace disease with cephalization, left-sided pleural effusion 2018 high-resolution CT scan of the chest shows upper lobe predominant moderate centrilobular emphysema, left-sided pleural effusion, some interlobular septal thickening and based on lymphadenopathy consistent with congestive heart failure, 3 mm right lower lobe solitary pulmonary nodule, images and apparently reviewed, this is my interpretation May 2019 CT chest showed stable right upper lobe pulmonary nodule, increased bilateral pleural effusions, emphysema, some thoracic adenopathy likely related to volume overload, pulmonary artery enlargement, a sending aorta dilation to 4.1 cm.  Sleep study: July 2018 polysomnogram: Mild obstructive sleep apnea, AHI 8.3, O2 saturation dropped to 84%  on 2 L nasal cannula 08/2017 CPAP titration> 10cm H20, needed 2 L O2 blended in  05/15/2019 Follow up : Emphysema, obstructive sleep apnea, diastolic heart failure, pulmonary hypertension Patient presents for a one-year follow-up.  Patient says overall his breathing has been doing about the same.  He gets short of breath with heavy activity.  Does wear out of energy very easily. Patient denies any chest pain increased cough fever or discolored mucus. He is not on any maintenance inhalers.  Patient has underlying mild sleep apnea.  Is on CPAP at bedtime.  He says he wears his CPAP each night and never misses any nights.  He gets an average about 4 to 5 hours each night.  Patient is on CPAP 10 cm H2O.  AHI is 11.9.  Positive leaks.  Daily usage at 4.5 hours.  100% compliant patient says he has not had a new mask since starting his CPAP.  He says he does not really notice if it leaks or not.  Says he does feel tired some during the daytime.  Patient has diastolic heart failure and pulmonary hypertension.  He is on Lasix 60 mg twice daily. And he does have chronic leg swelling that goes up and down.  He denies orthopnea  No Known Allergies  Immunization History  Administered Date(s) Administered  . Influenza, High Dose Seasonal PF 09/22/2016, 09/29/2017  . Pneumococcal-Unspecified 08/23/2016    Past Medical History:  Diagnosis Date  . Chronic kidney disease    tumor right kidney  . Coronary artery disease   . Diabetes mellitus    type 2  . Hypertension   . Myocardial infarction ([place])    1997  . Right renal mass     Tobacco History: Social History   Tobacco Use  Smoking Status Former  Smoker  . Packs/day: 0.30  . Years: 45.00  . Pack years: 13.50  . Types: Cigarettes  . Last attempt to quit: 02/15/1997  . Years since quitting: 22.2  Smokeless Tobacco Never Used   Counseling given: Not Answered   Outpatient Medications Prior to Visit  Medication Sig Dispense Refill  .  acetaminophen (TYLENOL) 325 MG tablet Take 650 mg by mouth every 6 (six) hours as needed (for pain.).    Marland Kitchen amLODipine (NORVASC) 5 MG tablet Take 5 mg by mouth daily.   0  . aspirin 81 MG EC tablet Take 81 mg by mouth daily. Swallow whole.    . benazepril (LOTENSIN) 40 MG tablet Take 40 mg by mouth daily.  0  . cloNIDine (CATAPRES) 0.1 MG tablet Take 0.1 mg by mouth 2 (two) times daily.    Marland Kitchen ezetimibe (ZETIA) 10 MG tablet Take 10 mg by mouth daily.    . furosemide (LASIX) 20 MG tablet Take 3 tablets (60 mg total) by mouth 2 (two) times daily. 180 tablet 11  . HUMALOG KWIKPEN 100 UNIT/ML KiwkPen Inject 6-8 Units into the skin 3 (three) times daily.   0  . LANTUS SOLOSTAR 100 UNIT/ML Solostar Pen Inject 23 Units into the skin every evening.    . nadolol (CORGARD) 40 MG tablet Take 40 mg by mouth daily.    . potassium chloride (K-DUR) 10 MEQ tablet Take 2 tablets (20 mEq total) by mouth 2 (two) times daily. 360 tablet 0  . rosuvastatin (CRESTOR) 20 MG tablet TAKE 1 TABLET BY MOUTH EVERY DAY 90 tablet 1  . SYNTHROID 100 MCG tablet Take 100 mcg by mouth daily.   0   No facility-administered medications prior to visit.      Review of Systems:   Constitutional:   No  weight loss, night sweats,  Fevers, chills,  +fatigue, or  lassitude.  HEENT:   No headaches,  Difficulty swallowing,  Tooth/dental problems, or  Sore throat,                No sneezing, itching, ear ache, nasal congestion, post nasal drip,   CV:  No chest pain,  Orthopnea, PND, ++swelling in lower extremities, anasarca, dizziness, palpitations, syncope.   GI  No heartburn, indigestion, abdominal pain, nausea, vomiting, diarrhea, change in bowel habits, loss of appetite, bloody stools.   Resp:    No excess mucus, no productive cough,  No non-productive cough,  No coughing up of blood.  No change in color of mucus.  No wheezing.  No chest wall deformity  Skin: no rash or lesions.  GU: no dysuria, change in color of urine, no  urgency or frequency.  No flank pain, no hematuria   MS:  No joint pain or swelling.  No decreased range of motion.  No back pain.    Physical Exam  Pulse 65   Temp 97.6 F (36.4 C)   Ht _0  (1.753 m)   Wt 206 lb 9.6 oz (93.7 kg)   SpO2 95%   BMI 30.51 kg/m   GEN: A/Ox3; pleasant , NAD, elderly ,    HEENT:  [place]/AT,  EACs-clear, TMs-wnl, NOSE-clear, THROAT-clear, no lesions, no postnasal drip or exudate noted.   NECK:  Supple w/ fair ROM; no JVD; normal carotid impulses w/o bruits; no thyromegaly or nodules palpated; no lymphadenopathy.    RESP  Clear  P & A; w/o, wheezes/ rales/ or rhonchi. no accessory muscle use, no dullness to percussion  CARD:  RRR, no m/r/g, 2+ peripheral edema, pulses intact, no cyanosis or clubbing.  GI:   Soft & nt; nml bowel sounds; no organomegaly or masses detected.   Musco: Warm bil, no deformities or joint swelling noted.   Neuro: alert, no focal deficits noted.    Skin: Warm, no lesions or rashes    Lab Results:  CBC  BMET  Imaging: Dg Chest 2 View  Result Date: 05/15/2019 CLINICAL DATA:  Shortness of breath EXAM: CHEST - 2 VIEW COMPARISON:  09/08/2018 FINDINGS: Cardiac shadow is mildly enlarged but stable. Postsurgical changes are seen. Bilateral pleural effusions left greater than right are noted increased when compared with the prior exam. Mild vascular congestion is noted. Some early infiltrate in the left base is present as well. IMPRESSION: Left basilar infiltrate with associated effusion. Small right effusion is noted as well. Changes consistent with vascular congestion and early CHF. Electronically Signed   By: Inez Catalina M.D.   On: 05/15/2019 13:02      PFT Results Latest Ref Rng & Units 03/25/2017  FVC-Pre L 2.58  FVC-Predicted Pre % 73  FVC-Post L 2.63  FVC-Predicted Post % 74  Pre FEV1/FVC % % 74  Post FEV1/FCV % % 83  FEV1-Pre L 1.91  FEV1-Predicted Pre % 75  FEV1-Post L 2.18  DLCO UNC% % 26  DLCO COR  %Predicted % 48  TLC L 4.80  TLC % Predicted % 68  RV % Predicted % 77    No results found for: NITRICOXIDE      Assessment & Plan:   COPD (chronic obstructive pulmonary disease) with emphysema (HCC) Emphysema on CT chest.  Patient has no active symptoms of cough or wheezing.  Is on no maintenance therapy.  We will continue to monitor and hold on inhalers at this time .   OSA (obstructive sleep apnea) Excellent compliance on CPAP.  However patient's mask is leaking quite a bit and his AHI is very high.  Need to get better control.  Will order new mask and supplies.  And change to his AutoSet.  Download in 4 weeks.  CHF (congestive heart failure) (HCC) Chronic diastolic heart failure patient's weight is trended up and lower extremity edema.  We will check chest x-ray today.  Continue on Lasix for now.  Late add Chest x-ray returned showing bilateral effusions left greater than right.  And some interstitial edema. Patient had no active symptoms of infection.  He does have some renal insufficiency will need to get labs including be met and BNP.  We will also check a CBC.  We will add Lasix 20 mg extra to his current regimen for the next 3 days and have close follow-up with a follow-up in 1 week.       Rexene Edison, NP 05/15/2019

## 2019-05-15 NOTE — Assessment & Plan Note (Signed)
Excellent compliance on CPAP.  However patient's mask is leaking quite a bit and his AHI is very high.  Need to get better control.  Will order new mask and supplies.  And change to his AutoSet.  Download in 4 weeks.

## 2019-05-15 NOTE — Progress Notes (Signed)
c 

## 2019-05-15 NOTE — Assessment & Plan Note (Signed)
Chronic diastolic heart failure patient's weight is trended up and lower extremity edema.  We will check chest x-ray today.  Continue on Lasix for now.  Late add Chest x-ray returned showing bilateral effusions left greater than right.  And some interstitial edema. Patient had no active symptoms of infection.  He does have some renal insufficiency will need to get labs including be met and BNP.  We will also check a CBC.  We will add Lasix 20 mg extra to his current regimen for the next 3 days and have close follow-up with a follow-up in 1 week.

## 2019-05-15 NOTE — Progress Notes (Signed)
Reviewed, agree 

## 2019-05-15 NOTE — Assessment & Plan Note (Signed)
Emphysema on CT chest.  Patient has no active symptoms of cough or wheezing.  Is on no maintenance therapy.  We will continue to monitor and hold on inhalers at this time .

## 2019-05-16 ENCOUNTER — Other Ambulatory Visit (INDEPENDENT_AMBULATORY_CARE_PROVIDER_SITE_OTHER): Payer: Medicare Other

## 2019-05-16 DIAGNOSIS — I5033 Acute on chronic diastolic (congestive) heart failure: Secondary | ICD-10-CM

## 2019-05-16 LAB — CBC WITH DIFFERENTIAL/PLATELET
Basophils Absolute: 0.1 10*3/uL (ref 0.0–0.1)
Basophils Relative: 0.7 % (ref 0.0–3.0)
Eosinophils Absolute: 0.1 10*3/uL (ref 0.0–0.7)
Eosinophils Relative: 1.6 % (ref 0.0–5.0)
HCT: 45.5 % (ref 39.0–52.0)
Hemoglobin: 15.1 g/dL (ref 13.0–17.0)
Lymphocytes Relative: 12.3 % (ref 12.0–46.0)
Lymphs Abs: 1 10*3/uL (ref 0.7–4.0)
MCHC: 33.2 g/dL (ref 30.0–36.0)
MCV: 82.6 fl (ref 78.0–100.0)
Monocytes Absolute: 0.6 10*3/uL (ref 0.1–1.0)
Monocytes Relative: 7 % (ref 3.0–12.0)
Neutro Abs: 6.5 10*3/uL (ref 1.4–7.7)
Neutrophils Relative %: 78.4 % — ABNORMAL HIGH (ref 43.0–77.0)
Platelets: 207 10*3/uL (ref 150.0–400.0)
RBC: 5.5 Mil/uL (ref 4.22–5.81)
RDW: 15.7 % — ABNORMAL HIGH (ref 11.5–15.5)
WBC: 8.3 10*3/uL (ref 4.0–10.5)

## 2019-05-16 LAB — BASIC METABOLIC PANEL
BUN: 16 mg/dL (ref 6–23)
CO2: 25 mEq/L (ref 19–32)
Calcium: 8.8 mg/dL (ref 8.4–10.5)
Chloride: 107 mEq/L (ref 96–112)
Creatinine, Ser: 1.51 mg/dL — ABNORMAL HIGH (ref 0.40–1.50)
GFR: 53.42 mL/min — ABNORMAL LOW (ref >60.00)
Glucose, Bld: 84 mg/dL (ref 70–99)
Potassium: 3.9 mEq/L (ref 3.5–5.1)
Sodium: 139 mEq/L (ref 135–145)

## 2019-05-16 LAB — BRAIN NATRIURETIC PEPTIDE: Pro B Natriuretic peptide (BNP): 877 pg/mL — ABNORMAL HIGH (ref 0.0–100.0)

## 2019-05-23 ENCOUNTER — Other Ambulatory Visit: Payer: Self-pay

## 2019-05-23 ENCOUNTER — Ambulatory Visit (INDEPENDENT_AMBULATORY_CARE_PROVIDER_SITE_OTHER): Payer: Medicare Other

## 2019-05-23 ENCOUNTER — Ambulatory Visit (INDEPENDENT_AMBULATORY_CARE_PROVIDER_SITE_OTHER): Payer: Medicare Other | Admitting: Adult Health

## 2019-05-23 ENCOUNTER — Encounter: Payer: Self-pay | Admitting: Adult Health

## 2019-05-23 VITALS — BP 124/80 | HR 60 | Temp 97.7°F | Ht 69.0 in | Wt 203.6 lb

## 2019-05-23 DIAGNOSIS — I272 Pulmonary hypertension, unspecified: Secondary | ICD-10-CM

## 2019-05-23 DIAGNOSIS — J90 Pleural effusion, not elsewhere classified: Secondary | ICD-10-CM | POA: Diagnosis not present

## 2019-05-23 DIAGNOSIS — J439 Emphysema, unspecified: Secondary | ICD-10-CM

## 2019-05-23 DIAGNOSIS — N183 Chronic kidney disease, stage 3 unspecified: Secondary | ICD-10-CM

## 2019-05-23 DIAGNOSIS — I2510 Atherosclerotic heart disease of native coronary artery without angina pectoris: Secondary | ICD-10-CM

## 2019-05-23 DIAGNOSIS — J432 Centrilobular emphysema: Secondary | ICD-10-CM

## 2019-05-23 DIAGNOSIS — I5033 Acute on chronic diastolic (congestive) heart failure: Secondary | ICD-10-CM

## 2019-05-23 DIAGNOSIS — G4733 Obstructive sleep apnea (adult) (pediatric): Secondary | ICD-10-CM | POA: Diagnosis not present

## 2019-05-23 DIAGNOSIS — I5032 Chronic diastolic (congestive) heart failure: Secondary | ICD-10-CM | POA: Diagnosis not present

## 2019-05-23 DIAGNOSIS — I509 Heart failure, unspecified: Secondary | ICD-10-CM | POA: Diagnosis not present

## 2019-05-23 LAB — BASIC METABOLIC PANEL
BUN: 22 mg/dL (ref 6–23)
CO2: 29 mEq/L (ref 19–32)
Calcium: 8.9 mg/dL (ref 8.4–10.5)
Chloride: 106 mEq/L (ref 96–112)
Creatinine, Ser: 1.65 mg/dL — ABNORMAL HIGH (ref 0.40–1.50)
GFR: 48.22 mL/min — ABNORMAL LOW (ref >60.00)
Glucose, Bld: 149 mg/dL — ABNORMAL HIGH (ref 70–99)
Potassium: 4.3 mEq/L (ref 3.5–5.1)
Sodium: 141 mEq/L (ref 135–145)

## 2019-05-23 LAB — BRAIN NATRIURETIC PEPTIDE: Pro B Natriuretic peptide (BNP): 1067 pg/mL — ABNORMAL HIGH (ref 0.0–100.0)

## 2019-05-23 NOTE — Assessment & Plan Note (Signed)
Decompensated diastolic congestive heart failure with bilateral effusions left greater than right and elevated BNP. Patient has peripheral edema on exam. We will adjust Lasix to 80 mg twice daily. Check be met and BNP today.  Patient is continue on potassium.  Will follow electrolyte panels closely Refer back to cardiology for further evaluation.  PLAN  Patient Instructions  Continue on CPAP At bedtime  .  Increase Lasix 61m 4 tabs Twice daily  (852mTwice daily  )  Continue on Potassium  Low salt diet  Legs elevated.  L;abs today  Follow up in 3-4 weeks with chest xray and labs with Dr. McLake Bellsr Parrett NP . CPAP download on return .  Refer to cardiology for CHF .  Please contact office for sooner follow up if symptoms do not improve or worsen or seek emergency care

## 2019-05-23 NOTE — Assessment & Plan Note (Signed)
Bilateral pleural effusion left greater than right suspect is secondary to congestive heart failure.  Will adjust Lasix if not improved on return.  Will need to consider thoracentesis.

## 2019-05-23 NOTE — Progress Notes (Signed)
'@Patient'  ID: [name], male    DOB: 1932/08/18, 85 y.o.   MRN: [id number]  Chief Complaint  Patient presents with  . Follow-up    Pleural Effusion     Referring provider: [name], MD  HPI: 85 year old male followed for emphysema, obstructive sleep apnea and pulmonary hypertension  TEST/EVENTS :  April 2018 pulmonary function testing: Ratio normal, FEV1 2.18 L 85% predicted, 14% change post bronchodilator, forced vital capacity 2.63 L 74% predicted, total lung capacity 4.80 L 68% predicted, ERV 35% predicted, DLCO 8.4 mL 26% predicted  Cardiac imaging: January 2018 nuclear stress test showed an LVEF of 30-44%, low risk study with normal perfusion.  Echo: March 2018 echocardiogram showed an estimated PA pressure of 61 mmHg April 2018 echocardiogram showed an LVEF of 60-65% with grade 2 diastolic dysfunction, moderate concentric left ventricular hypertrophy, left atrium was moderately dilated, pulmonary artery systolic pressure estimate 85 mmHg, right ventricle cavity size normal with normal systolic function, there is a moderate sized pericardial effusion  Imaging: March 2018 chest x-ray images independently reviewed showing bilateral hazy airspace disease with cephalization, left-sided pleural effusion 2018 high-resolution CT scan of the chest shows upper lobe predominant moderate centrilobular emphysema, left-sided pleural effusion, some interlobular septal thickening and based on lymphadenopathy consistent with congestive heart failure, 3 mm right lower lobe solitary pulmonary nodule, images and apparently reviewed, this is my interpretation May 2019 CT chest showed stable right upper lobe pulmonary nodule, increased bilateral pleural effusions, emphysema, some thoracic adenopathy likely related to volume overload, pulmonary artery enlargement, a sending aorta dilation to 4.1 cm.  Sleep study: July 2018 polysomnogram: Mild obstructive sleep apnea, AHI 8.3, O2 saturation  dropped to 84% on 2 L nasal cannula 08/2017 CPAP titration> 10cm H20, needed 2 L O2 blended in  05/23/2019 Follow up : Pleural Effusion , D CHF  Patient presents for a one-week follow-up.  Patient was seen last visit for a one-year follow-up.  At that time did not complain of any shortness of breath cough or wheezing. Chest x-ray showed bilateral effusions left greater than right.  Patient has known diastolic heart failure.  He is followed by cardiology.  He has pulmonary hypertension.  He is on Lasix 60 mg twice daily.  Lasix was increased by 20 mg daily for 2 days.  Labs showed elevated BNP at 877.  Patient returns today says that his breathing is doing okay.  He does get short of breath with heavy activity.  This this is is his his baseline.  He denies any increased cough wheezing congestion or fever.  He denies any orthopnea.  He uses CPAP at bedtime. CPAP was adjusted last visit.  He was changed to auto CPAP 5 to 15 cm H2O.  As his AHI was elevated around 12. Patient says he uses his CPAP every night. Weight is down 3 pounds.   No Known Allergies  Immunization History  Administered Date(s) Administered  . Influenza, High Dose Seasonal PF 09/22/2016, 09/29/2017  . Pneumococcal-Unspecified 08/23/2016    Past Medical History:  Diagnosis Date  . Chronic kidney disease    tumor right kidney  . Coronary artery disease   . Diabetes mellitus    type 2  . Hypertension   . Myocardial infarction ([place])    1997  . Right renal mass     Tobacco History: Social History   Tobacco Use  Smoking Status Former Smoker  . Packs/day: 0.30  . Years: 45.00  . Pack years: 13.50  .  Types: Cigarettes  . Quit date: 02/15/1997  . Years since quitting: 22.2  Smokeless Tobacco Never Used   Counseling given: Not Answered   Outpatient Medications Prior to Visit  Medication Sig Dispense Refill  . acetaminophen (TYLENOL) 325 MG tablet Take 650 mg by mouth every 6 (six) hours as needed (for pain.).     Marland Kitchen amLODipine (NORVASC) 5 MG tablet Take 5 mg by mouth daily.   0  . aspirin 81 MG EC tablet Take 81 mg by mouth daily. Swallow whole.    . benazepril (LOTENSIN) 40 MG tablet Take 40 mg by mouth daily.  0  . cloNIDine (CATAPRES) 0.1 MG tablet Take 0.1 mg by mouth 2 (two) times daily.    Marland Kitchen ezetimibe (ZETIA) 10 MG tablet Take 10 mg by mouth daily.    . furosemide (LASIX) 20 MG tablet Take 3 tablets (60 mg total) by mouth 2 (two) times daily. 180 tablet 11  . HUMALOG KWIKPEN 100 UNIT/ML KiwkPen Inject 6-8 Units into the skin 3 (three) times daily.   0  . LANTUS SOLOSTAR 100 UNIT/ML Solostar Pen Inject 23 Units into the skin every evening.    . nadolol (CORGARD) 40 MG tablet Take 40 mg by mouth daily.    . potassium chloride (K-DUR) 10 MEQ tablet Take 2 tablets (20 mEq total) by mouth 2 (two) times daily. 360 tablet 0  . rosuvastatin (CRESTOR) 20 MG tablet TAKE 1 TABLET BY MOUTH EVERY DAY 90 tablet 1  . SYNTHROID 100 MCG tablet Take 100 mcg by mouth daily.   0   No facility-administered medications prior to visit.      Review of Systems:   Constitutional:   No  weight loss, night sweats,  Fevers, chills,  =fatigue, or  lassitude.  HEENT:   No headaches,  Difficulty swallowing,  Tooth/dental problems, or  Sore throat,                No sneezing, itching, ear ache, nasal congestion, post nasal drip,   CV:  No chest pain,  Orthopnea, PND,  , anasarca, dizziness, palpitations, syncope.   GI  No heartburn, indigestion, abdominal pain, nausea, vomiting, diarrhea, change in bowel habits, loss of appetite, bloody stools.   Resp:    No excess mucus, no productive cough,  No non-productive cough,  No coughing up of blood.  No change in color of mucus.  No wheezing.  No chest wall deformity  Skin: no rash or lesions.  GU: no dysuria, change in color of urine, no urgency or frequency.  No flank pain, no hematuria   MS:  No joint pain or swelling.  No decreased range of motion.  No back pain.     Physical Exam  BP 124/80 (BP Location: Left Arm, Patient Position: Sitting, Cuff Size: Normal)   Pulse 60   Temp 97.7 F (36.5 C) (Oral)   Ht '5\' 9"'  (1.753 m)   Wt 203 lb 9.6 oz (92.4 kg)   SpO2 96%   BMI 30.07 kg/m   GEN: A/Ox3; pleasant , NAD, obese   HEENT:  [place]/AT,  EACs-clear, TMs-wnl, NOSE-clear, THROAT-clear, no lesions, no postnasal drip or exudate noted.   NECK:  Supple w/ fair ROM; no JVD; normal carotid impulses w/o bruits; no thyromegaly or nodules palpated; no lymphadenopathy.    RESP  Clear  P & A; w/o, wheezes/ rales/ or rhonchi. no accessory muscle use, no dullness to percussion  CARD:  RRR, no m/r/g, 2+ peripheral edema,  pulses intact, no cyanosis or clubbing.  GI:   Soft & nt; nml bowel sounds; no organomegaly or masses detected.   Musco: Warm bil, no deformities or joint swelling noted.   Neuro: alert, no focal deficits noted.    Skin: Warm, no lesions or rashes    Lab Results:  CBC    Component Value Date/Time   WBC 8.3 05/16/2019 0936   RBC 5.50 05/16/2019 0936   HGB 15.1 05/16/2019 0936   HCT 45.5 05/16/2019 0936   PLT 207.0 05/16/2019 0936   MCV 82.6 05/16/2019 0936   MCH 27.5 11/23/2017 1059   MCHC 33.2 05/16/2019 0936   RDW 15.7 (H) 05/16/2019 0936   LYMPHSABS 1.0 05/16/2019 0936   MONOABS 0.6 05/16/2019 0936   EOSABS 0.1 05/16/2019 0936   BASOSABS 0.1 05/16/2019 0936    BMET    Component Value Date/Time   NA 141 05/23/2019 1124   NA 145 (H) 06/16/2018 1215   K 4.3 05/23/2019 1124   CL 106 05/23/2019 1124   CO2 29 05/23/2019 1124   GLUCOSE 149 (H) 05/23/2019 1124   BUN 22 05/23/2019 1124   BUN 21 06/16/2018 1215   CREATININE 1.65 (H) 05/23/2019 1124   CREATININE 1.54 (H) 03/31/2017 1028   CALCIUM 8.9 05/23/2019 1124   GFRNONAA 36 (L) 06/16/2018 1215   GFRAA 41 (L) 06/16/2018 1215    BNP    Component Value Date/Time   BNP 865.9 (H) 11/23/2017 1059   BNP 596.6 (H) 03/31/2017 1028    ProBNP    Component Value  Date/Time   PROBNP 1,067.0 (H) 05/23/2019 1124    Imaging: Dg Chest 2 View  Result Date: 05/23/2019 CLINICAL DATA:  Congestive heart failure. EXAM: CHEST - 2 VIEW COMPARISON:  05/15/2019 FINDINGS: The heart is enlarged but stable. Stable surgical changes from bypass surgery. Chronic vascular congestion along with peribronchial thickening and mild interstitial edema. There are persistent bilateral pleural effusions and overlying bibasilar atelectasis. IMPRESSION: Chronic appearing CHF with small effusions. Electronically Signed   By: Marijo Sanes M.D.   On: 05/23/2019 11:02   Dg Chest 2 View  Result Date: 05/15/2019 CLINICAL DATA:  Shortness of breath EXAM: CHEST - 2 VIEW COMPARISON:  09/08/2018 FINDINGS: Cardiac shadow is mildly enlarged but stable. Postsurgical changes are seen. Bilateral pleural effusions left greater than right are noted increased when compared with the prior exam. Mild vascular congestion is noted. Some early infiltrate in the left base is present as well. IMPRESSION: Left basilar infiltrate with associated effusion. Small right effusion is noted as well. Changes consistent with vascular congestion and early CHF. Electronically Signed   By: Inez Catalina M.D.   On: 05/15/2019 13:02      PFT Results Latest Ref Rng & Units 03/25/2017  FVC-Pre L 2.58  FVC-Predicted Pre % 73  FVC-Post L 2.63  FVC-Predicted Post % 74  Pre FEV1/FVC % % 74  Post FEV1/FCV % % 83  FEV1-Pre L 1.91  FEV1-Predicted Pre % 75  FEV1-Post L 2.18  DLCO UNC% % 26  DLCO COR %Predicted % 48  TLC L 4.80  TLC % Predicted % 68  RV % Predicted % 77    No results found for: NITRICOXIDE      Assessment & Plan:   CHF (congestive heart failure) (HCC) Decompensated diastolic congestive heart failure with bilateral effusions left greater than right and elevated BNP. Patient has peripheral edema on exam. We will adjust Lasix to 80 mg twice daily. Check be  met and BNP today.  Patient is continue on  potassium.  Will follow electrolyte panels closely Refer back to cardiology for further evaluation.  PLAN  Patient Instructions  Continue on CPAP At bedtime  .  Increase Lasix 42m 4 tabs Twice daily  (878mTwice daily  )  Continue on Potassium  Low salt diet  Legs elevated.  L;abs today  Follow up in 3-4 weeks with chest xray and labs with Dr. McLake Bellsr  NP . CPAP download on return .  Refer to cardiology for CHF .  Please contact office for sooner follow up if symptoms do not improve or worsen or seek emergency care       Stage 3 chronic kidney disease (HCHilltopAdjust Lasix.  Check be met  COPD (chronic obstructive pulmonary disease) with emphysema (HCC) Appears stable.  Does have apparent volume overload.  No active wheezing or cough.  Check chest x-ray on return  OSA (obstructive sleep apnea) Continue on CPAP at bedtime.  CPAP download on return  Plan  Patient Instructions  Continue on CPAP At bedtime  .  Increase Lasix 2088m tabs Twice daily  (11m66mice daily  )  Continue on Potassium  Low salt diet  Legs elevated.  L;abs today  Follow up in 3-4 weeks with chest xray and labs with Dr. [place] Bells NP . CPAP download on return .  Refer to cardiology for CHF .  Please contact office for sooner follow up if symptoms do not improve or worsen or seek emergency care       Pleural effusion Bilateral pleural effusion left greater than right suspect is secondary to congestive heart failure.  Will adjust Lasix if not improved on return.  Will need to consider thoracentesis.     TammRexene Edison 05/23/2019

## 2019-05-23 NOTE — Assessment & Plan Note (Signed)
Adjust Lasix.  Check be met

## 2019-05-23 NOTE — Patient Instructions (Addendum)
Continue on CPAP At bedtime  .  Increase Lasix 20mg  4 tabs Twice daily  (80mg  Twice daily  )  Continue on Potassium  Low salt diet  Legs elevated.  L;abs today  Follow up in 3-4 weeks with chest xray and labs with Dr. [place] Bells or Ozil Stettler NP . CPAP download on return .  Refer to cardiology for CHF .  Please contact office for sooner follow up if symptoms do not improve or worsen or seek emergency care

## 2019-05-23 NOTE — Assessment & Plan Note (Signed)
Appears stable.  Does have apparent volume overload.  No active wheezing or cough.  Check chest x-ray on return

## 2019-05-23 NOTE — Assessment & Plan Note (Signed)
Continue on CPAP at bedtime.  CPAP download on return  Plan  Patient Instructions  Continue on CPAP At bedtime  .  Increase Lasix 20mg  4 tabs Twice daily  (80mg  Twice daily  )  Continue on Potassium  Low salt diet  Legs elevated.  L;abs today  Follow up in 3-4 weeks with chest xray and labs with Dr. [place] Bells or Parrett NP . CPAP download on return .  Refer to cardiology for CHF .  Please contact office for sooner follow up if symptoms do not improve or worsen or seek emergency care

## 2019-05-23 NOTE — Progress Notes (Signed)
Reviewed, agree 

## 2019-05-24 ENCOUNTER — Telehealth: Payer: Self-pay | Admitting: Internal Medicine

## 2019-05-24 ENCOUNTER — Other Ambulatory Visit: Payer: Self-pay | Admitting: Internal Medicine

## 2019-05-24 NOTE — Telephone Encounter (Signed)
Pt was seen yesterday 05/23/2019 by Rexene Edison NP/ pulm. Told pt to be seen by Cards. Pt is in fluid overload, BNP was 1067. Lasix was adjusted to 80mg  BID.  Pt was called, he states he is watching his sodium and has increased his lasix as was prescribed. Pt was told Dr/RN not in office today but will get a call tomorrow with advice on sooner appointment to be seen. Pt verbalized understanding.

## 2019-05-24 NOTE — Progress Notes (Signed)
Called spoke with patient, advised of lab results / recs as stated by Parrett NP.  Pt verbalized understanding and denied any questions.  Patient will continue the lasix and other recommendations from visit.  Referral has been placed for cards but documentation on appt.  Will continue to check on this.

## 2019-05-24 NOTE — Telephone Encounter (Signed)
New Message     Pt saw the Dr on Tuesday and told him he has fluid and they want him to see Dr Debara Pickett     Please call

## 2019-05-25 NOTE — Telephone Encounter (Signed)
Is APP appointment OK? Do you think he can be virtual?

## 2019-05-25 NOTE — Telephone Encounter (Signed)
APP ok - in office preferred for CHF exacerbation  Dr. Lemmie Evens

## 2019-05-25 NOTE — Telephone Encounter (Signed)
Routed to [facility] pool to arrange in-office appointment with APP as MD has no availability

## 2019-05-30 NOTE — Telephone Encounter (Signed)
Staff message sent to [facility] pool to arrange appointment

## 2019-05-30 NOTE — Telephone Encounter (Signed)
Patient scheduled for appointment 06/02/19 with Lurena Joiner PA

## 2019-06-01 ENCOUNTER — Telehealth: Payer: Self-pay | Admitting: Cardiology

## 2019-06-01 NOTE — Telephone Encounter (Signed)

## 2019-06-02 ENCOUNTER — Encounter: Payer: Self-pay | Admitting: Cardiology

## 2019-06-02 ENCOUNTER — Ambulatory Visit (INDEPENDENT_AMBULATORY_CARE_PROVIDER_SITE_OTHER): Payer: Medicare Other | Admitting: Cardiology

## 2019-06-02 ENCOUNTER — Other Ambulatory Visit: Payer: Self-pay

## 2019-06-02 VITALS — BP 182/78 | HR 54 | Temp 98.1°F | Ht 69.0 in | Wt 200.0 lb

## 2019-06-02 DIAGNOSIS — I10 Essential (primary) hypertension: Secondary | ICD-10-CM

## 2019-06-02 DIAGNOSIS — I5032 Chronic diastolic (congestive) heart failure: Secondary | ICD-10-CM

## 2019-06-02 DIAGNOSIS — I272 Pulmonary hypertension, unspecified: Secondary | ICD-10-CM | POA: Diagnosis not present

## 2019-06-02 DIAGNOSIS — N183 Chronic kidney disease, stage 3 unspecified: Secondary | ICD-10-CM

## 2019-06-02 DIAGNOSIS — E785 Hyperlipidemia, unspecified: Secondary | ICD-10-CM

## 2019-06-02 DIAGNOSIS — Z905 Acquired absence of kidney: Secondary | ICD-10-CM | POA: Diagnosis not present

## 2019-06-02 DIAGNOSIS — E118 Type 2 diabetes mellitus with unspecified complications: Secondary | ICD-10-CM | POA: Diagnosis not present

## 2019-06-02 DIAGNOSIS — J439 Emphysema, unspecified: Secondary | ICD-10-CM | POA: Diagnosis not present

## 2019-06-02 DIAGNOSIS — Z794 Long term (current) use of insulin: Secondary | ICD-10-CM | POA: Diagnosis not present

## 2019-06-02 DIAGNOSIS — Z951 Presence of aortocoronary bypass graft: Secondary | ICD-10-CM

## 2019-06-02 DIAGNOSIS — IMO0001 Reserved for inherently not codable concepts without codable children: Secondary | ICD-10-CM

## 2019-06-02 DIAGNOSIS — N2889 Other specified disorders of kidney and ureter: Secondary | ICD-10-CM

## 2019-06-02 DIAGNOSIS — I2510 Atherosclerotic heart disease of native coronary artery without angina pectoris: Secondary | ICD-10-CM

## 2019-06-02 DIAGNOSIS — G4733 Obstructive sleep apnea (adult) (pediatric): Secondary | ICD-10-CM

## 2019-06-02 DIAGNOSIS — I5033 Acute on chronic diastolic (congestive) heart failure: Secondary | ICD-10-CM | POA: Diagnosis not present

## 2019-06-02 NOTE — Patient Instructions (Signed)
Medication Instructions:  CONTINUE Taking Lasix 80mg  twice a day then on Monday decrease Lasix to 60mg  twice a day  If you need a refill on your cardiac medications before your next appointment, please call your pharmacy.   Lab work: Your physician recommends that you return for lab work [id number], BNP If you have labs (blood work) drawn today and your tests are completely normal, you will receive your results only by: Marland Kitchen MyChart Message (if you have MyChart) OR . A paper copy in the mail If you have any lab test that is abnormal or we need to change your treatment, we will call you to review the results.  Testing/Procedures: NONE   Follow-Up: At [facility], you and your health needs are our priority.  As part of our continuing mission to provide you with exceptional heart care, we have created designated Provider Care Teams.  These Care Teams include your primary Cardiologist (physician) and Advanced Practice Providers (APPs -  Physician Assistants and Nurse Practitioners) who all work together to provide you with the care you need, when you need it. You will need a follow up appointment in 6-8 weeks.  Please call our office 2 months in advance to schedule this appointment.  You may see Dr Pixie Casino or one of the following Advanced Practice Providers on your designated Care Team: [place], [place] . Fabian Sharp, PA-C  Any Other Special Instructions Will Be Listed Below (If Applicable). Check and record your weight once a day  Follow up with your pulmonary doctor as scheduled

## 2019-06-02 NOTE — Assessment & Plan Note (Signed)
[place] low risk Jan 2018

## 2019-06-02 NOTE — Assessment & Plan Note (Signed)
Repeat B/P by me 142/72

## 2019-06-02 NOTE — Assessment & Plan Note (Signed)
EF 60% with grade 2 DD June 2019

## 2019-06-02 NOTE — Assessment & Plan Note (Signed)
Pt had his Lasix increased from 60 mg BID to 80 mg BID by Pulmonary 05/23/2019. He is in the office today for follow up.

## 2019-06-02 NOTE — Progress Notes (Signed)
Cardiology Office Note:    Date:  06/02/2019   ID:  [name], DOB 1932-03-18, MRN [id number]  PCP:  [name], MD  Cardiologist:  Dr Debara Pickett Electrophysiologist:  None   Referring MD: [name], MD   No chief complaint on file.   History of Present Illness:    [name] is a 85 y.o. male with a hx of remote bypass grafting in 1996.  He had a low risk Myoview in June 2018.  Patient also has hypertension, insulin-dependent diabetes, sleep apnea on CPAP, chronic renal insufficiency stage III, diastolic heart failure, dyslipidemia, and severe pulmonary artery hypertension.  Echocardiogram in June 2019 showed an ejection fraction of 60% with grade 2 diastolic dysfunction.  His PA pressure was 90 mmHg.  He saw Dr. Debara Pickett in April of this year and was doing well.  He recently followed up with the pulmonary service, he was felt to be volume overloaded and his Lasix was increased from 60 mg twice a day to 80 mg twice a day.  This was on June 16.  He was told to call here for cardiology follow-up.  Patient is seen today for follow-up.  He tells me he is feeling pretty good.  He does still have some lower extremity edema.  His weight is down to 200 pounds.  He just started weighing himself daily.  He is not had unusual shortness of breath or dizziness or near syncope.  Past Medical History:  Diagnosis Date  . Chronic kidney disease    tumor right kidney  . Coronary artery disease   . Diabetes mellitus    type 2  . Hypertension   . Myocardial infarction ([place])    1997  . Right renal mass     Past Surgical History:  Procedure Laterality Date  . CARDIAC CATHETERIZATION  1996  . CORONARY ARTERY BYPASS GRAFT    . DOPPLER ECHOCARDIOGRAPHY  2011  . HERNIA REPAIR    . IR GENERIC HISTORICAL  12/09/2016   IR RADIOLOGIST EVAL & MGMT 12/09/2016 [name], MD [facility] INTERV RAD  . ROBOTIC ASSITED PARTIAL NEPHRECTOMY Right 02/19/2017   Procedure: XI ROBOTIC ASSITED PARTIAL NEPHRECTOMY;  Surgeon:  [name], MD;  Location: [facility] ORS;  Service: Urology;  Laterality: Right;    Current Medications: Current Meds  Medication Sig  . acetaminophen (TYLENOL) 325 MG tablet Take 650 mg by mouth every 6 (six) hours as needed (for pain.).  Marland Kitchen amLODipine (NORVASC) 5 MG tablet Take 5 mg by mouth daily.   Marland Kitchen aspirin 81 MG EC tablet Take 81 mg by mouth daily. Swallow whole.  . benazepril (LOTENSIN) 40 MG tablet Take 40 mg by mouth daily.  . cloNIDine (CATAPRES) 0.1 MG tablet Take 0.1 mg by mouth 2 (two) times daily.  Marland Kitchen ezetimibe (ZETIA) 10 MG tablet Take 10 mg by mouth daily.  . furosemide (LASIX) 20 MG tablet Take 3 tablets (60 mg total) by mouth 2 (two) times daily.  Marland Kitchen HUMALOG KWIKPEN 100 UNIT/ML KiwkPen Inject 6-8 Units into the skin 3 (three) times daily.   Marland Kitchen LANTUS SOLOSTAR 100 UNIT/ML Solostar Pen Inject 23 Units into the skin every evening.  . nadolol (CORGARD) 40 MG tablet Take 40 mg by mouth daily.  . potassium chloride (K-DUR) 10 MEQ tablet TAKE 2 TABLETS(20 MEQ) BY MOUTH TWICE DAILY  . rosuvastatin (CRESTOR) 20 MG tablet TAKE 1 TABLET BY MOUTH EVERY DAY  . SYNTHROID 100 MCG tablet Take 100 mcg by mouth daily.  Allergies:   Patient has no known allergies.   Social History   Socioeconomic History  . Marital status: Married    Spouse name: Not on file  . Number of children: Not on file  . Years of education: Not on file  . Highest education level: Not on file  Occupational History  . Not on file  Social Needs  . Financial resource strain: Not on file  . Food insecurity    Worry: Not on file    Inability: Not on file  . Transportation needs    Medical: Not on file    Non-medical: Not on file  Tobacco Use  . Smoking status: Former Smoker    Packs/day: 0.30    Years: 45.00    Pack years: 13.50    Types: Cigarettes    Quit date: 02/15/1997    Years since quitting: 22.3  . Smokeless tobacco: Never Used  Substance and Sexual Activity  . Alcohol use: No  . Drug use:  No  . Sexual activity: Yes  Lifestyle  . Physical activity    Days per week: Not on file    Minutes per session: Not on file  . Stress: Not on file  Relationships  . Social Herbalist on phone: Not on file    Gets together: Not on file    Attends religious service: Not on file    Active member of club or organization: Not on file    Attends meetings of clubs or organizations: Not on file    Relationship status: Not on file  Other Topics Concern  . Not on file  Social History Narrative  . Not on file     Family History: The patient's family history includes Diabetes in his mother and sister.  ROS:   Please see the history of present illness.     All other systems reviewed and are negative.  EKGs/Labs/Other Studies Reviewed:    The following studies were reviewed today: Echo June 2019  EKG:  EKG is ordered today.  The ekg ordered today demonstrates NSR, SB 54, NSST changes  Recent Labs: 05/16/2019: Hemoglobin 15.1; Platelets 207.0 05/23/2019: BUN 22; Creatinine, Ser 1.65; Potassium 4.3; Pro B Natriuretic peptide (BNP) 1,067.0; Sodium 141  Recent Lipid Panel    Component Value Date/Time   CHOL  06/27/2010 0342    130        ATP III CLASSIFICATION:  <200     mg/dL   Desirable  200-239  mg/dL   Borderline High  >=240    mg/dL   High          TRIG 107 06/27/2010 0342   HDL 44 06/27/2010 0342   CHOLHDL 3.0 06/27/2010 0342   VLDL 21 06/27/2010 0342   LDLCALC  06/27/2010 0342    65        Total Cholesterol/HDL:CHD Risk Coronary Heart Disease Risk Table                     Men   Women  1/2 Average Risk   3.4   3.3  Average Risk       5.0   4.4  2 X Average Risk   9.6   7.1  3 X Average Risk  23.4   11.0        Use the calculated Patient Ratio above and the CHD Risk Table to determine the patient's CHD Risk.        ATP  III CLASSIFICATION (LDL):  <100     mg/dL   Optimal  100-129  mg/dL   Near or Above                    Optimal  130-159  mg/dL    Borderline  160-189  mg/dL   High  >190     mg/dL   Very High    Physical Exam:    VS:  BP (!) 182/78   Pulse (!) 54   Temp 98.1 F (36.7 C)   Ht 5\' 9"  (1.753 m)   Wt 200 lb (90.7 kg)   SpO2 91%   BMI 29.53 kg/m     Wt Readings from Last 3 Encounters:  06/02/19 200 lb (90.7 kg)  05/23/19 203 lb 9.6 oz (92.4 kg)  05/15/19 206 lb 9.6 oz (93.7 kg)     GEN:  Well nourished, well developed in no acute distress HEENT: Normal NECK: No JVD; No carotid bruits LYMPHATICS: No lymphadenopathy CARDIAC: RRR, no murmurs, rubs, gallops RESPIRATORY:  Clear to auscultation without rales, wheezing or rhonchi  ABDOMEN: Soft, non-tender, non-distended MUSCULOSKELETAL:  No edema; No deformity  SKIN: Warm and dry NEUROLOGIC:  Alert and oriented x 3 PSYCHIATRIC:  Normal affect   ASSESSMENT:    Acute on chronic diastolic heart failure (HCC) Pt had his Lasix increased from 60 mg BID to 80 mg BID by Pulmonary 05/23/2019. He is in the office today for follow up.   Pulmonary hypertension (HCC) PA peak 90 mmHg by echo June 3005  Chronic diastolic heart failure (HCC) EF 60% with grade 2 DD June 2019  Essential hypertension Repeat B/P by me 142/72  Stage 3 chronic kidney disease ([place]) Check BMP today on increased Lasix  Hx of CABG 1996- Myoview low risk Jan 2018  Insulin dependent diabetes mellitus with complications ([place]) .  PLAN:    I suggested we check a BNP and BMP today.  I told him to continue the 80 mg BID over the weekend then cut back to 60 mg BID on Monday.  He is to weigh himself daily and keep a log so we can determine his goal weight.  He knows to avoid salt.  He has a f/u with Tammy Parrett NP on 7/10 and will keep that.  F/U Dr Debara Pickett 6-8 weeks if stable.    Medication Adjustments/Labs and Tests Ordered: Current medicines are reviewed at length with the patient today.  Concerns regarding medicines are outlined above.  Orders Placed This Encounter  Procedures  . Basic  Metabolic Panel (BMET)  . Pro b natriuretic peptide (BNP)[facility])  . EKG 12-Lead   No orders of the defined types were placed in this encounter.   There are no Patient Instructions on file for this visit.   Angelena Form, PA-C  06/02/2019 4:42 PM    [facility]

## 2019-06-02 NOTE — Assessment & Plan Note (Signed)
PA peak 90 mmHg by echo June 2019

## 2019-06-02 NOTE — Assessment & Plan Note (Signed)
Check BMP today on increased Lasix

## 2019-06-03 LAB — PRO B NATRIURETIC PEPTIDE: NT-Pro BNP: 2862 pg/mL — ABNORMAL HIGH (ref 0–486)

## 2019-06-03 LAB — BASIC METABOLIC PANEL
BUN/Creatinine Ratio: 11 (ref 10–24)
BUN: 18 mg/dL (ref 8–27)
CO2: 25 mmol/L (ref 20–29)
Calcium: 9.1 mg/dL (ref 8.6–10.2)
Chloride: 104 mmol/L (ref 96–106)
Creatinine, Ser: 1.67 mg/dL — ABNORMAL HIGH (ref 0.76–1.27)
GFR calc Af Amer: 43 mL/min/{1.73_m2} — ABNORMAL LOW (ref >59)
GFR calc non Af Amer: 37 mL/min/{1.73_m2} — ABNORMAL LOW (ref >59)
Glucose: 120 mg/dL — ABNORMAL HIGH (ref 65–99)
Potassium: 4.1 mmol/L (ref 3.5–5.2)
Sodium: 142 mmol/L (ref 134–144)

## 2019-06-05 DIAGNOSIS — E039 Hypothyroidism, unspecified: Secondary | ICD-10-CM | POA: Diagnosis not present

## 2019-06-07 DIAGNOSIS — E039 Hypothyroidism, unspecified: Secondary | ICD-10-CM | POA: Diagnosis not present

## 2019-06-07 DIAGNOSIS — E1165 Type 2 diabetes mellitus with hyperglycemia: Secondary | ICD-10-CM | POA: Diagnosis not present

## 2019-06-07 DIAGNOSIS — E789 Disorder of lipoprotein metabolism, unspecified: Secondary | ICD-10-CM | POA: Diagnosis not present

## 2019-06-07 DIAGNOSIS — Z6829 Body mass index (BMI) 29.0-29.9, adult: Secondary | ICD-10-CM | POA: Diagnosis not present

## 2019-06-07 DIAGNOSIS — E118 Type 2 diabetes mellitus with unspecified complications: Secondary | ICD-10-CM | POA: Diagnosis not present

## 2019-06-07 DIAGNOSIS — I10 Essential (primary) hypertension: Secondary | ICD-10-CM | POA: Diagnosis not present

## 2019-06-16 ENCOUNTER — Other Ambulatory Visit: Payer: Self-pay

## 2019-06-16 ENCOUNTER — Ambulatory Visit (INDEPENDENT_AMBULATORY_CARE_PROVIDER_SITE_OTHER): Payer: Medicare Other | Admitting: Adult Health

## 2019-06-16 ENCOUNTER — Encounter: Payer: Self-pay | Admitting: Adult Health

## 2019-06-16 VITALS — BP 116/80 | HR 55 | Temp 98.0°F | Ht 69.0 in | Wt 200.6 lb

## 2019-06-16 DIAGNOSIS — I5032 Chronic diastolic (congestive) heart failure: Secondary | ICD-10-CM | POA: Diagnosis not present

## 2019-06-16 DIAGNOSIS — I2510 Atherosclerotic heart disease of native coronary artery without angina pectoris: Secondary | ICD-10-CM

## 2019-06-16 DIAGNOSIS — G4733 Obstructive sleep apnea (adult) (pediatric): Secondary | ICD-10-CM | POA: Diagnosis not present

## 2019-06-16 NOTE — Progress Notes (Signed)
@Patient  ID: [name], male    DOB: Jun 19, 1932, 85 y.o.   MRN: [id number]  Chief Complaint  Patient presents with  . Follow-up    Referring provider: [name], MD  HPI: 85 year old male followed for emphysema, obstructive sleep apnea and pulmonary hypertension  TEST/EVENTS :  April 2018 pulmonary function testing: Ratio normal, FEV1 2.18 L 85% predicted, 14% change post bronchodilator, forced vital capacity 2.63 L 74% predicted, total lung capacity 4.80 L 68% predicted, ERV 35% predicted, DLCO 8.4 mL 26% predicted  Cardiac imaging: January 2018 nuclear stress test showed an LVEF of 30-44%, low risk study with normal perfusion.  Echo: March 2018 echocardiogram showed an estimated PA pressure of 61 mmHg April 2018 echocardiogram showed an LVEF of 60-65% with grade 2 diastolic dysfunction, moderate concentric left ventricular hypertrophy, left atrium was moderately dilated, pulmonary artery systolic pressure estimate 85 mmHg, right ventricle cavity size normal with normal systolic function, there is a moderate sized pericardial effusion  Imaging: March 2018 chest x-ray images independently reviewed showing bilateral hazy airspace disease with cephalization, left-sided pleural effusion 2018 high-resolution CT scan of the chest shows upper lobe predominant moderate centrilobular emphysema, left-sided pleural effusion, some interlobular septal thickening and based on lymphadenopathy consistent with congestive heart failure, 3 mm right lower lobe solitary pulmonary nodule, images and apparently reviewed, this is my interpretation May 2019 CT chest showed stable right upper lobe pulmonary nodule, increased bilateral pleural effusions, emphysema, some thoracic adenopathy likely related to volume overload, pulmonary artery enlargement, a sending aorta dilation to 4.1 cm.  Sleep study: July 2018 polysomnogram: Mild obstructive sleep apnea, AHI 8.3, O2 saturation dropped to 84% on 2 L  nasal cannula 08/2017 CPAP titration> 10cm H20, needed 2 L O2 blended in  06/16/2019 Follow up : OSA , D CHF , Pleural Effusion ,  Patient presents for a one-month follow-up.  Patient was seen last visit with suspected decompensated diastolic heart failure.  Chest x-ray showed bilateral effusions left greater than right.  Labs showed elevated BNP at 877.  Patient was recommended to increase Lasix to 80 mg twice daily.  He was referred back to cardiology for follow-up.  He was recommend to decrease his Lasix to 60 mg twice daily.  Patient says he is feeling better has less leg swelling and weight is down 6 pounds.  He does feel as though his shortness of breath is slightly better.  He does get winded with heavy activity.  He denies any orthopnea.  Patient has underlying sleep apnea.  Is on CPAP at bedtime.  Patient says he feels rested. CPAP download shows excellent compliance with daily average usage around 4 hours.  Patient is on CPAP AutoSet 5 to 15 cm H2O.  Daily average pressure around 12.  AHI 8.1.    No Known Allergies  Immunization History  Administered Date(s) Administered  . Influenza, High Dose Seasonal PF 09/22/2016, 09/29/2017  . Pneumococcal-Unspecified 08/23/2016    Past Medical History:  Diagnosis Date  . Chronic kidney disease    tumor right kidney  . Coronary artery disease   . Diabetes mellitus    type 2  . Hypertension   . Myocardial infarction ([place])    1997  . Right renal mass     Tobacco History: Social History   Tobacco Use  Smoking Status Former Smoker  . Packs/day: 0.30  . Years: 45.00  . Pack years: 13.50  . Types: Cigarettes  . Quit date: 02/15/1997  . Years since  quitting: 22.3  Smokeless Tobacco Never Used   Counseling given: Not Answered   Outpatient Medications Prior to Visit  Medication Sig Dispense Refill  . acetaminophen (TYLENOL) 325 MG tablet Take 650 mg by mouth every 6 (six) hours as needed (for pain.).    Marland Kitchen amLODipine (NORVASC) 5  MG tablet Take 5 mg by mouth daily.   0  . aspirin 81 MG EC tablet Take 81 mg by mouth daily. Swallow whole.    . benazepril (LOTENSIN) 40 MG tablet Take 40 mg by mouth daily.  0  . cloNIDine (CATAPRES) 0.1 MG tablet Take 0.1 mg by mouth 2 (two) times daily.    Marland Kitchen ezetimibe (ZETIA) 10 MG tablet Take 10 mg by mouth daily.    . furosemide (LASIX) 20 MG tablet Take 3 tablets (60 mg total) by mouth 2 (two) times daily. 180 tablet 11  . HUMALOG KWIKPEN 100 UNIT/ML KiwkPen Inject 6-8 Units into the skin 3 (three) times daily.   0  . LANTUS SOLOSTAR 100 UNIT/ML Solostar Pen Inject 23 Units into the skin every evening.    . nadolol (CORGARD) 40 MG tablet Take 40 mg by mouth daily.    . potassium chloride (K-DUR) 10 MEQ tablet TAKE 2 TABLETS(20 MEQ) BY MOUTH TWICE DAILY 360 tablet 2  . rosuvastatin (CRESTOR) 20 MG tablet TAKE 1 TABLET BY MOUTH EVERY DAY 90 tablet 1  . SYNTHROID 100 MCG tablet Take 100 mcg by mouth daily.   0   No facility-administered medications prior to visit.      Review of Systems:   Constitutional:   No  weight loss, night sweats,  Fevers, chills, + fatigue, or  lassitude.  HEENT:   No headaches,  Difficulty swallowing,  Tooth/dental problems, or  Sore throat,                No sneezing, itching, ear ache, nasal congestion, post nasal drip,   CV:  No chest pain,  Orthopnea, PND, +swelling in lower extremities, no anasarca, dizziness, palpitations, syncope.   GI  No heartburn, indigestion, abdominal pain, nausea, vomiting, diarrhea, change in bowel habits, loss of appetite, bloody stools.   Resp: No chest wall deformity  Skin: no rash or lesions.  GU: no dysuria, change in color of urine, no urgency or frequency.  No flank pain, no hematuria   MS:  No joint pain or swelling.  No decreased range of motion.  No back pain.    Physical Exam  BP 116/80   Pulse (!) 55   Temp 98 F (36.7 C) (Oral)   Ht 5\' 9"  (1.753 m)   Wt 200 lb 9.6 oz (91 kg)   SpO2 96%   BMI  29.62 kg/m   GEN: A/Ox3; pleasant , NAD, elderly   HEENT:  [place]/AT,  EACs-clear, TMs-wnl, NOSE-clear, THROAT-clear, no lesions, no postnasal drip or exudate noted.   NECK:  Supple w/ fair ROM; no JVD; normal carotid impulses w/o bruits; no thyromegaly or nodules palpated; no lymphadenopathy.    RESP  Clear  P & A; w/o, wheezes/ rales/ or rhonchi. no accessory muscle use, no dullness to percussion  CARD:  RRR, no m/r/g, tr to 1+ peripheral edema, pulses intact, no cyanosis or clubbing.  GI:   Soft & nt; nml bowel sounds; no organomegaly or masses detected.   Musco: Warm bil, no deformities or joint swelling noted.   Neuro: alert, no focal deficits noted.    Skin: Warm, no lesions or rashes  Lab Results:  CBC    Component Value Date/Time   WBC 8.3 05/16/2019 0936   RBC 5.50 05/16/2019 0936   HGB 15.1 05/16/2019 0936   HCT 45.5 05/16/2019 0936   PLT 207.0 05/16/2019 0936   MCV 82.6 05/16/2019 0936   MCH 27.5 11/23/2017 1059   MCHC 33.2 05/16/2019 0936   RDW 15.7 (H) 05/16/2019 0936   LYMPHSABS 1.0 05/16/2019 0936   MONOABS 0.6 05/16/2019 0936   EOSABS 0.1 05/16/2019 0936   BASOSABS 0.1 05/16/2019 0936    BMET    Component Value Date/Time   NA 142 06/02/2019 1644   K 4.1 06/02/2019 1644   CL 104 06/02/2019 1644   CO2 25 06/02/2019 1644   GLUCOSE 120 (H) 06/02/2019 1644   GLUCOSE 149 (H) 05/23/2019 1124   BUN 18 06/02/2019 1644   CREATININE 1.67 (H) 06/02/2019 1644   CREATININE 1.54 (H) 03/31/2017 1028   CALCIUM 9.1 06/02/2019 1644   GFRNONAA 37 (L) 06/02/2019 1644   GFRAA 43 (L) 06/02/2019 1644    BNP    Component Value Date/Time   BNP 865.9 (H) 11/23/2017 1059   BNP 596.6 (H) 03/31/2017 1028    ProBNP    Component Value Date/Time   PROBNP 2,862 (H) 06/02/2019 1644   PROBNP 1,067.0 (H) 05/23/2019 1124    Imaging: Dg Chest 2 View  Result Date: 05/23/2019 CLINICAL DATA:  Congestive heart failure. EXAM: CHEST - 2 VIEW COMPARISON:  05/15/2019  FINDINGS: The heart is enlarged but stable. Stable surgical changes from bypass surgery. Chronic vascular congestion along with peribronchial thickening and mild interstitial edema. There are persistent bilateral pleural effusions and overlying bibasilar atelectasis. IMPRESSION: Chronic appearing CHF with small effusions. Electronically Signed   By: Marijo Sanes M.D.   On: 05/23/2019 11:02      PFT Results Latest Ref Rng & Units 03/25/2017  FVC-Pre L 2.58  FVC-Predicted Pre % 73  FVC-Post L 2.63  FVC-Predicted Post % 74  Pre FEV1/FVC % % 74  Post FEV1/FCV % % 83  FEV1-Pre L 1.91  FEV1-Predicted Pre % 75  FEV1-Post L 2.18  DLCO UNC% % 26  DLCO COR %Predicted % 48  TLC L 4.80  TLC % Predicted % 68  RV % Predicted % 77    No results found for: NITRICOXIDE      Assessment & Plan:   No problem-specific Assessment & Plan notes found for this encounter.     Rexene Edison, NP 06/16/2019

## 2019-06-16 NOTE — Patient Instructions (Addendum)
Continue on CPAP At bedtime  .  Adjust CPAP pressure to 10 to 15cm .  CPAP download in 1 month Continue on Lasix 60mg  Twice daily  .  Low salt diet  Legs elevated.  Follow up in 3-4 months with Dr. Lake Bells or Jeromey Kruer NP . - Continue to follow up with Cardiology as planned  Please contact office for sooner follow up if symptoms do not improve or worsen or seek emergency care

## 2019-06-19 NOTE — Assessment & Plan Note (Addendum)
Recent decompensation appears to be clinically improved.  Weight is down and leg swelling is decreased. Patient is advised on a low-salt diet keep legs elevated. Continue cardiology follow-up Plan  Patient Instructions  Continue on CPAP At bedtime  .  Adjust CPAP pressure to 10 to 15cm .  CPAP download in 1 month Continue on Lasix 60mg  Twice daily  .  Low salt diet  Legs elevated.  Follow up in 3-4 months with Dr. Lake Bells or Parrett NP . - Continue to follow up with Cardiology as planned  Please contact office for sooner follow up if symptoms do not improve or worsen or seek emergency care    n

## 2019-06-19 NOTE — Assessment & Plan Note (Addendum)
Patient has excellent compliance on CPAP.  Will adjust pressures to see if we can improve his control.  And decrease his number of events.  Plan  Patient Instructions  Continue on CPAP At bedtime  .  Adjust CPAP pressure to 10 to 15cm .  CPAP download in 1 month Continue on Lasix 60mg  Twice daily  .  Low salt diet  Legs elevated.  Follow up in 3-4 months with Dr. [place] Bells or Simrit Gohlke NP . - Continue to follow up with Cardiology as planned  Please contact office for sooner follow up if symptoms do not improve or worsen or seek emergency care

## 2019-06-20 ENCOUNTER — Other Ambulatory Visit: Payer: Self-pay

## 2019-06-20 ENCOUNTER — Ambulatory Visit: Payer: Medicare Other | Admitting: Adult Health

## 2019-06-26 ENCOUNTER — Other Ambulatory Visit: Payer: Self-pay | Admitting: Internal Medicine

## 2019-08-05 DIAGNOSIS — Z23 Encounter for immunization: Secondary | ICD-10-CM | POA: Diagnosis not present

## 2019-08-08 ENCOUNTER — Other Ambulatory Visit: Payer: Self-pay | Admitting: Internal Medicine

## 2019-09-05 ENCOUNTER — Ambulatory Visit (INDEPENDENT_AMBULATORY_CARE_PROVIDER_SITE_OTHER): Payer: Medicare Other | Admitting: Internal Medicine

## 2019-09-05 ENCOUNTER — Encounter: Payer: Self-pay | Admitting: Internal Medicine

## 2019-09-05 ENCOUNTER — Ambulatory Visit (HOSPITAL_COMMUNITY)
Admission: RE | Admit: 2019-09-05 | Discharge: 2019-09-05 | Disposition: A | Discharge status: Home / Self Care | Payer: Medicare Other | Source: Ambulatory Visit | Attending: Urology | Admitting: Urology

## 2019-09-05 ENCOUNTER — Other Ambulatory Visit (HOSPITAL_COMMUNITY): Payer: Self-pay | Admitting: Urology

## 2019-09-05 ENCOUNTER — Other Ambulatory Visit: Payer: Self-pay

## 2019-09-05 VITALS — BP 140/78 | HR 56 | Temp 98.1°F | Ht 69.0 in | Wt 200.4 lb

## 2019-09-05 DIAGNOSIS — Z951 Presence of aortocoronary bypass graft: Secondary | ICD-10-CM

## 2019-09-05 DIAGNOSIS — I5032 Chronic diastolic (congestive) heart failure: Secondary | ICD-10-CM

## 2019-09-05 DIAGNOSIS — E785 Hyperlipidemia, unspecified: Secondary | ICD-10-CM

## 2019-09-05 DIAGNOSIS — C642 Malignant neoplasm of left kidney, except renal pelvis: Secondary | ICD-10-CM | POA: Insufficient documentation

## 2019-09-05 DIAGNOSIS — G4733 Obstructive sleep apnea (adult) (pediatric): Secondary | ICD-10-CM | POA: Diagnosis not present

## 2019-09-05 DIAGNOSIS — I2510 Atherosclerotic heart disease of native coronary artery without angina pectoris: Secondary | ICD-10-CM | POA: Diagnosis not present

## 2019-09-05 DIAGNOSIS — R0602 Shortness of breath: Secondary | ICD-10-CM | POA: Diagnosis not present

## 2019-09-05 MED ORDER — VASCEPA 1 G PO CAPS: 2.0000 g | ORAL_CAPSULE | Freq: Two times a day (BID) | ORAL | 11 refills | Status: DC

## 2019-09-05 NOTE — Patient Instructions (Signed)
Medication Instructions:  START vascepa 2 gram by mouth twice daily -- please use free 30 day card at pharmacy -- [email or web address] >> disease funds >> hypercholesterolemia   If you need a refill on your cardiac medications before your next appointment, please call your pharmacy.   Lab work: FASTING lab work in 3 months to check cholesterol  If you have labs (blood work) drawn today and your tests are completely normal, you will receive your results only by: Marland Kitchen MyChart Message (if you have MyChart) OR . A paper copy in the mail If you have any lab test that is abnormal or we need to change your treatment, we will call you to review the results.   Follow-Up: At [facility], you and your health needs are our priority.  As part of our continuing mission to provide you with exceptional heart care, we have created designated Provider Care Teams.  These Care Teams include your primary Cardiologist (physician) and Advanced Practice Providers (APPs -  Physician Assistants and Nurse Practitioners) who all work together to provide you with the care you need, when you need it. You will need a follow up appointment in 6 months.  Please call our office 2 months in advance to schedule this appointment.  You may see Pixie Casino, MD or one of the following Advanced Practice Providers on your designated Care Team: [place], [place] . Fabian Sharp, PA-C  Any Other Special Instructions Will Be Listed Below (If Applicable).

## 2019-09-05 NOTE — Progress Notes (Signed)
OFFICE NOTE  Chief Complaint:  Follow-up telehealth visit  Primary Care Physician: [name], MD  HPI:  [name] is a 85 y.o. male with a past medial history significant for chronic diastolic heart failure and bilateral pleural effusions.  LVEF is remained stable around 55 to 60%.  He also has a history of mixed pulmonary venous and arterial hypertension and has been seen in pulmonary by Dr. Lake Bells.  He had a moderate sized right pleural effusion which resolved after more aggressive diuresis.  He also has a history of coronary disease and had CABG in 1996.  In addition he had chronic kidney disease however this is improved with creatinine now ranging between 1.5-1.7.  There is a history of hypertension, dyslipidemia, right bundle branch block and obstructive sleep apnea, which he says he is compliant on nightly CPAP.  When I last saw him 6 months ago his weight is unchanged from then.  He told me that he has no new complaints.  He denies chest pain, worsening shortness of breath or other associated symptoms.  He was mowing his lawn today.  He says he can mow his lawn for at least a half an hour.  He says he typically breaks it up in half because of the heat, but does not have any worsening shortness of breath or heart failure symptoms with this.  09/06/2019  Mr. Frasier returns today for follow-up of a recent telehealth visit in April 2020.  At that time he had stable chronic diastolic heart failure.  In June 2020 he was seen by Kerin Ransom, PA-C in the office, at the time of seem to have some increased swelling and weight gain.  His Lasix was increased to 80 mg twice daily from 60 mg twice daily and he reports some improvement of his symptoms.  Since then his weight has been fairly stable.  He was seen again by pulmonary in July and reports improved symptoms from his diuretic.  Today he says he is doing well.  Denies any chest pain or worsening shortness of breath.  Weight has been fairly  stable.  Recent labs show total cholesterol 113, triglycerides 254, HDL 32 and LDL 30 with hemoglobin A1c of 7.6.  EKG shows a junctional rhythm at 56 with lateral ST and T wave changes.  PMHx:  Past Medical History:  Diagnosis Date  . Chronic kidney disease    tumor right kidney  . Coronary artery disease   . Diabetes mellitus    type 2  . Hypertension   . Myocardial infarction ([place])    1997  . Right renal mass     Past Surgical History:  Procedure Laterality Date  . CARDIAC CATHETERIZATION  1996  . CORONARY ARTERY BYPASS GRAFT    . DOPPLER ECHOCARDIOGRAPHY  2011  . HERNIA REPAIR    . IR GENERIC HISTORICAL  12/09/2016   IR RADIOLOGIST EVAL & MGMT 12/09/2016 [name], MD [facility] INTERV RAD  . ROBOTIC ASSITED PARTIAL NEPHRECTOMY Right 02/19/2017   Procedure: XI ROBOTIC ASSITED PARTIAL NEPHRECTOMY;  Surgeon: [name], MD;  Location: [facility];  Service: Urology;  Laterality: Right;    FAMHx:  Family History  Problem Relation Age of Onset  . Diabetes Mother   . Diabetes Sister     SOCHx:   reports that he quit smoking about 22 years ago. His smoking use included cigarettes. He has a 13.50 pack-year smoking history. He has never used smokeless tobacco. He reports that he  does not drink alcohol or use drugs.  ALLERGIES:  No Known Allergies  ROS: Pertinent items noted in HPI and remainder of comprehensive ROS otherwise negative.  HOME MEDS: Current Outpatient Medications on File Prior to Visit  Medication Sig Dispense Refill  . acetaminophen (TYLENOL) 325 MG tablet Take 650 mg by mouth every 6 (six) hours as needed (for pain.).    Marland Kitchen amLODipine (NORVASC) 5 MG tablet Take 5 mg by mouth daily.   0  . aspirin 81 MG EC tablet Take 81 mg by mouth daily. Swallow whole.    . benazepril (LOTENSIN) 40 MG tablet Take 40 mg by mouth daily.  0  . cloNIDine (CATAPRES) 0.1 MG tablet Take 0.1 mg by mouth 2 (two) times daily.    Marland Kitchen ezetimibe (ZETIA) 10 MG tablet Take 10 mg by mouth  daily.    . furosemide (LASIX) 20 MG tablet Take 3 tablets (60 mg total) by mouth 2 (two) times daily. 180 tablet 11  . HUMALOG KWIKPEN 100 UNIT/ML KiwkPen Inject 6-8 Units into the skin 3 (three) times daily.   0  . LANTUS SOLOSTAR 100 UNIT/ML Solostar Pen Inject 23 Units into the skin every evening.    . nadolol (CORGARD) 40 MG tablet Take 40 mg by mouth daily.    . potassium chloride (K-DUR) 10 MEQ tablet TAKE 2 TABLETS(20 MEQ) BY MOUTH TWICE DAILY 360 tablet 2  . rosuvastatin (CRESTOR) 20 MG tablet TAKE 1 TABLET BY MOUTH EVERY DAY 90 tablet 0  . SYNTHROID 100 MCG tablet Take 100 mcg by mouth daily.   0   No current facility-administered medications on file prior to visit.     LABS/IMAGING: No results found for this or any previous visit (from the past 48 hour(s)). Dg Chest 2 View  Result Date: 09/05/2019 CLINICAL DATA:  Renal cell carcinoma on the left. Shortness of breath. EXAM: CHEST - 2 VIEW COMPARISON:  May 15, 2019 FINDINGS: Stable cardiomegaly. Stable left effusion with underlying opacity. Stable small right effusion. No other interval changes. IMPRESSION: Stable bilateral effusions, left greater than right, both small. No other interval changes. Electronically Signed   By: Dorise Bullion III M.D   On: 09/05/2019 14:00    LIPID PANEL:    Component Value Date/Time   CHOL  06/27/2010 0342    130        ATP III CLASSIFICATION:  <200     mg/dL   Desirable  200-239  mg/dL   Borderline High  >=240    mg/dL   High          TRIG 107 06/27/2010 0342   HDL 44 06/27/2010 0342   CHOLHDL 3.0 06/27/2010 0342   VLDL 21 06/27/2010 0342   LDLCALC  06/27/2010 0342    65        Total Cholesterol/HDL:CHD Risk Coronary Heart Disease Risk Table                     Men   Women  1/2 Average Risk   3.4   3.3  Average Risk       5.0   4.4  2 X Average Risk   9.6   7.1  3 X Average Risk  23.4   11.0        Use the calculated Patient Ratio above and the CHD Risk Table to determine the  patient's CHD Risk.        ATP III CLASSIFICATION (LDL):  <100  mg/dL   Optimal  100-129  mg/dL   Near or Above                    Optimal  130-159  mg/dL   Borderline  160-189  mg/dL   High  >190     mg/dL   Very High     WEIGHTS: Wt Readings from Last 3 Encounters:  09/05/19 200 lb 6 oz (90.9 kg)  06/16/19 200 lb 9.6 oz (91 kg)  06/02/19 200 lb (90.7 kg)    VITALS: BP 140/78 (BP Location: Left Arm, Patient Position: Sitting, Cuff Size: Normal)   Pulse (!) 56   Temp 98.1 F (36.7 C)   Ht 5\' 9"  (1.753 m)   Wt 200 lb 6 oz (90.9 kg)   BMI 29.59 kg/m   EXAM: General appearance: alert and no distress Neck: no carotid bruit, no JVD and thyroid not enlarged, symmetric, no tenderness/mass/nodules Lungs: clear to auscultation bilaterally Heart: regular rate and rhythm Abdomen: soft, non-tender; bowel sounds normal; no masses,  no organomegaly Extremities: extremities normal, atraumatic, no cyanosis or edema Pulses: 2+ and symmetric Skin: Skin color, texture, turgor normal. No rashes or lesions Neurologic: Grossly normal Psych: Pleasant  EKG: Junctional rhythm at 56 with lateral ST and T wave changes- personally reviewed  ASSESSMENT: 1. Chronicdiastolic congestive heart failure with pleural effusions (EF 55-60%, 03/2017) 2. Mixed pulmonary venous/arterial hypertension 3. Moderate sized pericardial effusion - resolved 4. CAD status post CABG 1996 5. CKD with improving creatinine now 1.54 (as high as 3.84, 2.87, 2.36 recently) 6. Hypertension 7. Dyslipidemia 8. RBBB 9. OSA on CPAP  10.Type 2 diabetes-hemoglobin A1c 7.6  PLAN: 1.   Mr. Bain seems to be stable with regards to volume status given chronic diastolic heart failure.  He is noted to have persistently elevated triglycerides today.  Hemoglobin A1c is reasonably well controlled at 7.6.  Given his history of coronary disease and prior bypass as well as longstanding elevated triglycerides with reasonably  controlled diabetes, I think he benefit from addition of Vascepa.  Will provide a free 30-day trial medication and get prior authorization from his insurance company based on data from the reduce it trial. Repeat lipids in 3 months.  Follow-up with me in 6 months.  Pixie Casino, MD, [facility], [place] Director of the [facility] Lip[facility] &  Card[facility] Diplomate of the American Board of Clinical Lipidology Attending Cardiologist  Direct Dial: [phone]  Fax: [phone]  Website:  [email or web address] Osgood Leala Bryand 09/06/2019, 11:29 AM

## 2019-09-06 ENCOUNTER — Encounter: Payer: Self-pay | Admitting: Internal Medicine

## 2019-09-12 DIAGNOSIS — C641 Malignant neoplasm of right kidney, except renal pelvis: Secondary | ICD-10-CM | POA: Diagnosis not present

## 2019-09-19 DIAGNOSIS — C641 Malignant neoplasm of right kidney, except renal pelvis: Secondary | ICD-10-CM | POA: Diagnosis not present

## 2019-10-06 ENCOUNTER — Encounter

## 2019-10-18 ENCOUNTER — Ambulatory Visit: Payer: Medicare Other | Admitting: Adult Health

## 2019-10-23 DIAGNOSIS — E782 Mixed hyperlipidemia: Secondary | ICD-10-CM | POA: Diagnosis not present

## 2019-10-23 DIAGNOSIS — E039 Hypothyroidism, unspecified: Secondary | ICD-10-CM | POA: Diagnosis not present

## 2019-10-23 DIAGNOSIS — E559 Vitamin D deficiency, unspecified: Secondary | ICD-10-CM | POA: Diagnosis not present

## 2019-10-23 DIAGNOSIS — E118 Type 2 diabetes mellitus with unspecified complications: Secondary | ICD-10-CM | POA: Diagnosis not present

## 2019-10-23 DIAGNOSIS — E1165 Type 2 diabetes mellitus with hyperglycemia: Secondary | ICD-10-CM | POA: Diagnosis not present

## 2019-10-30 DIAGNOSIS — E782 Mixed hyperlipidemia: Secondary | ICD-10-CM | POA: Diagnosis not present

## 2019-10-30 DIAGNOSIS — I10 Essential (primary) hypertension: Secondary | ICD-10-CM | POA: Diagnosis not present

## 2019-10-30 DIAGNOSIS — L98499 Non-pressure chronic ulcer of skin of other sites with unspecified severity: Secondary | ICD-10-CM | POA: Diagnosis not present

## 2019-10-30 DIAGNOSIS — N183 Chronic kidney disease, stage 3 unspecified: Secondary | ICD-10-CM | POA: Diagnosis not present

## 2019-10-30 DIAGNOSIS — E1165 Type 2 diabetes mellitus with hyperglycemia: Secondary | ICD-10-CM | POA: Diagnosis not present

## 2019-11-06 DIAGNOSIS — I10 Essential (primary) hypertension: Secondary | ICD-10-CM | POA: Diagnosis not present

## 2019-11-06 DIAGNOSIS — E118 Type 2 diabetes mellitus with unspecified complications: Secondary | ICD-10-CM | POA: Diagnosis not present

## 2019-11-06 DIAGNOSIS — E039 Hypothyroidism, unspecified: Secondary | ICD-10-CM | POA: Diagnosis not present

## 2019-11-06 DIAGNOSIS — E559 Vitamin D deficiency, unspecified: Secondary | ICD-10-CM | POA: Diagnosis not present

## 2019-11-06 DIAGNOSIS — E1165 Type 2 diabetes mellitus with hyperglycemia: Secondary | ICD-10-CM | POA: Diagnosis not present

## 2019-11-06 DIAGNOSIS — E789 Disorder of lipoprotein metabolism, unspecified: Secondary | ICD-10-CM | POA: Diagnosis not present

## 2019-11-13 DIAGNOSIS — R609 Edema, unspecified: Secondary | ICD-10-CM | POA: Diagnosis not present

## 2019-11-13 DIAGNOSIS — E039 Hypothyroidism, unspecified: Secondary | ICD-10-CM | POA: Diagnosis not present

## 2019-11-13 DIAGNOSIS — I10 Essential (primary) hypertension: Secondary | ICD-10-CM | POA: Diagnosis not present

## 2019-11-13 DIAGNOSIS — E118 Type 2 diabetes mellitus with unspecified complications: Secondary | ICD-10-CM | POA: Diagnosis not present

## 2019-11-13 DIAGNOSIS — E559 Vitamin D deficiency, unspecified: Secondary | ICD-10-CM | POA: Diagnosis not present

## 2019-11-13 DIAGNOSIS — N183 Chronic kidney disease, stage 3 unspecified: Secondary | ICD-10-CM | POA: Diagnosis not present

## 2019-11-13 DIAGNOSIS — E782 Mixed hyperlipidemia: Secondary | ICD-10-CM | POA: Diagnosis not present

## 2019-11-14 ENCOUNTER — Telehealth (HOSPITAL_COMMUNITY): Payer: Self-pay | Admitting: *Deleted

## 2019-11-14 NOTE — Telephone Encounter (Signed)

## 2019-11-14 NOTE — Telephone Encounter (Signed)
The above patient or their representative was contacted and gave the following answers to these questions:         Do you have any of the following symptoms?    NO  Fever                    Cough                   Shortness of breath  Do  you have any of the following other symptoms?    muscle pain         vomiting,        diarrhea        rash         weakness        red eye        abdominal pain         bruising          bruising or bleeding              joint pain           severe headache    Have you been in contact with someone who was or has been sick in the past 2 weeks?  NO  Yes                 Unsure                         Unable to assess   Does the person that you were in contact with have any of the following symptoms?   Cough         shortness of breath           muscle pain         vomiting,            diarrhea            rash            weakness           fever            red eye           abdominal pain           bruising  or  bleeding                joint pain                severe headache                 COMMENTS OR ACTION PLAN FOR THIS PATIENT:  11/14/19 no to all per [facility] w/Dr. Kim's office.  Progress

## 2019-11-15 ENCOUNTER — Inpatient Hospital Stay (HOSPITAL_COMMUNITY): Admission: RE | Admit: 2019-11-15 | Payer: Medicare Other | Source: Ambulatory Visit

## 2019-11-15 ENCOUNTER — Other Ambulatory Visit (HOSPITAL_COMMUNITY): Payer: Self-pay | Admitting: Internal Medicine

## 2019-11-15 DIAGNOSIS — R600 Localized edema: Secondary | ICD-10-CM

## 2019-11-22 ENCOUNTER — Other Ambulatory Visit: Payer: Self-pay

## 2019-11-22 ENCOUNTER — Ambulatory Visit (HOSPITAL_COMMUNITY)
Admission: RE | Admit: 2019-11-22 | Discharge: 2019-11-22 | Disposition: A | Discharge status: Home / Self Care | Payer: Medicare Other | Source: Ambulatory Visit | Attending: Internal Medicine | Admitting: Internal Medicine

## 2019-11-22 DIAGNOSIS — R600 Localized edema: Secondary | ICD-10-CM

## 2019-12-11 DIAGNOSIS — E1165 Type 2 diabetes mellitus with hyperglycemia: Secondary | ICD-10-CM | POA: Diagnosis not present

## 2019-12-11 DIAGNOSIS — E789 Disorder of lipoprotein metabolism, unspecified: Secondary | ICD-10-CM | POA: Diagnosis not present

## 2019-12-11 DIAGNOSIS — Z6829 Body mass index (BMI) 29.0-29.9, adult: Secondary | ICD-10-CM | POA: Diagnosis not present

## 2019-12-11 DIAGNOSIS — I10 Essential (primary) hypertension: Secondary | ICD-10-CM | POA: Diagnosis not present

## 2019-12-11 DIAGNOSIS — E039 Hypothyroidism, unspecified: Secondary | ICD-10-CM | POA: Diagnosis not present

## 2019-12-11 DIAGNOSIS — E118 Type 2 diabetes mellitus with unspecified complications: Secondary | ICD-10-CM | POA: Diagnosis not present

## 2020-01-11 ENCOUNTER — Ambulatory Visit: Payer: Medicare Other | Admitting: Adult Health

## 2020-01-15 ENCOUNTER — Ambulatory Visit (INDEPENDENT_AMBULATORY_CARE_PROVIDER_SITE_OTHER): Payer: Medicare Other | Admitting: Adult Health

## 2020-01-15 ENCOUNTER — Encounter: Payer: Self-pay | Admitting: Adult Health

## 2020-01-15 ENCOUNTER — Other Ambulatory Visit: Payer: Self-pay

## 2020-01-15 DIAGNOSIS — G4733 Obstructive sleep apnea (adult) (pediatric): Secondary | ICD-10-CM

## 2020-01-15 DIAGNOSIS — I272 Pulmonary hypertension, unspecified: Secondary | ICD-10-CM

## 2020-01-15 DIAGNOSIS — I5032 Chronic diastolic (congestive) heart failure: Secondary | ICD-10-CM | POA: Diagnosis not present

## 2020-01-15 DIAGNOSIS — J9611 Chronic respiratory failure with hypoxia: Secondary | ICD-10-CM | POA: Diagnosis not present

## 2020-01-15 DIAGNOSIS — I10 Essential (primary) hypertension: Secondary | ICD-10-CM

## 2020-01-15 NOTE — Progress Notes (Signed)
@Patient  ID: [name], male    DOB: [birth year], 86 y.o.   MRN: [id number]  Chief Complaint  Patient presents with  . Follow-up    COPD    Referring provider: [name], MD  HPI: 86 year old male followed for emphysema, obstructive sleep apnea and pulmonary hypertension Medical history significant for renal cell carcinoma  TEST/EVENTS :  April 2018 pulmonary function testing: Ratio normal, FEV1 2.18 L 85% predicted, 14% change post bronchodilator, forced vital capacity 2.63 L 74% predicted, total lung capacity 4.80 L 68% predicted, ERV 35% predicted, DLCO 8.4 mL 26% predicted  Cardiac imaging: January 2018 nuclear stress test showed an LVEF of 30-44%, low risk study with normal perfusion.  Echo: March 2018 echocardiogram showed an estimated PA pressure of 61 mmHg April 2018 echocardiogram showed an LVEF of 60-65% with grade 2 diastolic dysfunction, moderate concentric left ventricular hypertrophy, left atrium was moderately dilated, pulmonary artery systolic pressure estimate 85 mmHg, right ventricle cavity size normal with normal systolic function, there is a moderate sized pericardial effusion  2D echo June 2019 EF 55 to 01%, grade 2 diastolic dysfunction, severely dilated left atrium, decreased RV systolic function, mildly dilated right atrium, tricuspid valve severe regurgitation, pulmonary artery pressure 90 mmHg  Imaging: March 2018 chest x-ray images independently reviewed showing bilateral hazy airspace disease with cephalization, left-sided pleural effusion 2018 high-resolution CT scan of the chest shows upper lobe predominant moderate centrilobular emphysema, left-sided pleural effusion, some interlobular septal thickening and based on lymphadenopathy consistent with congestive heart failure, 3 mm right lower lobe solitary pulmonary nodule, images and apparently reviewed, this is my interpretation May 2019 CT chest showed stable right upper lobe pulmonary nodule,  increased bilateral pleural effusions, emphysema, some thoracic adenopathy likely related to volume overload, pulmonary artery enlargement, a sending aorta dilation to 4.1 cm.  Sleep study: July 2018 polysomnogram: Mild obstructive sleep apnea, AHI 8.3, O2 saturation dropped to 84% on 2 L nasal cannula 08/2017 CPAP titration> 10cm H20, needed 2 L O2 blended in  01/15/2020 Follow up : OSA , Emyphysema , D CHF  Patient returns for a 39-month follow-up.  Patient has underlying obstructive sleep apnea is on nocturnal CPAP.  Patient says he is doing well on CPAP.  He wears it every single night.  Says he never misses a night.  CPAP download shows excellent compliance with 100% usage daily average usage at 4 hours.  AHI 3.4.  Patient is on auto CPAP 5 to 15 cm H2O.  Patient is on oxygen 2 L with activity and at bedtime with CPAP.  Patient's insurance is requiring an oxygen qualification test today.  Walk test in the office showed O2 saturations at 87% walking on room air.  Patient was able to maintain O2 saturations are greater than 90% on 2 L with ambulation. Says he does light house chores, drives . Remains independent . Gets winded with heavy activity .   Patient has known emphysema on CT chest.  He is not on any inhalers.  Says overall shortness of breath is He denies increased cough.  Patient is on ACE inhibitor.   Patient has known diastolic heart failure with severe pulmonary hypertension Has chronic kidney disease stage III.  Last 2D echo showed pulmonary artery pressures at 90 mmHg and June 2019.  He is followed by cardiology.  He remains on Lasix 60 mg twice daily.  Chest x-ray September 2020 showed stable bilateral small pleural effusions left greater than right. No increased leg swelling .  Weight appears stable over the last 6 months.  Patient is down 5 pounds today at 195lbs.   Has been having ulcers on right lower leg, PCP following . Venous Doppler neg for DVT 11/2020 .    No Known  Allergies  Immunization History  Administered Date(s) Administered  . Fluad Quad(high Dose 65+) 08/05/2019  . Influenza, High Dose Seasonal PF 09/22/2016, 09/29/2017  . Pneumococcal-Unspecified 08/23/2016    Past Medical History:  Diagnosis Date  . Chronic kidney disease    tumor right kidney  . Coronary artery disease   . Diabetes mellitus    type 2  . Hypertension   . Myocardial infarction ([place])    1997  . Right renal mass     Tobacco History: Social History   Tobacco Use  Smoking Status Former Smoker  . Packs/day: 0.30  . Years: 45.00  . Pack years: 13.50  . Types: Cigarettes  . Quit date: 02/15/1997  . Years since quitting: 22.9  Smokeless Tobacco Never Used   Counseling given: Not Answered   Outpatient Medications Prior to Visit  Medication Sig Dispense Refill  . acetaminophen (TYLENOL) 325 MG tablet Take 650 mg by mouth every 6 (six) hours as needed (for pain.).    Marland Kitchen amLODipine (NORVASC) 5 MG tablet Take 5 mg by mouth daily.   0  . aspirin 81 MG EC tablet Take 81 mg by mouth daily. Swallow whole.    . benazepril (LOTENSIN) 40 MG tablet Take 40 mg by mouth daily.  0  . cloNIDine (CATAPRES) 0.1 MG tablet Take 0.1 mg by mouth 2 (two) times daily.    Marland Kitchen ezetimibe (ZETIA) 10 MG tablet Take 10 mg by mouth daily.    . furosemide (LASIX) 20 MG tablet Take 3 tablets (60 mg total) by mouth 2 (two) times daily. 180 tablet 11  . HUMALOG KWIKPEN 100 UNIT/ML KiwkPen Inject 6-8 Units into the skin 3 (three) times daily.   0  . Icosapent Ethyl (VASCEPA) 1 g CAPS Take 2 capsules (2 g total) by mouth 2 (two) times daily. 120 capsule 11  . LANTUS SOLOSTAR 100 UNIT/ML Solostar Pen Inject 23 Units into the skin every evening.    . nadolol (CORGARD) 40 MG tablet Take 40 mg by mouth daily.    . potassium chloride (K-DUR) 10 MEQ tablet TAKE 2 TABLETS(20 MEQ) BY MOUTH TWICE DAILY 360 tablet 2  . rosuvastatin (CRESTOR) 20 MG tablet TAKE 1 TABLET BY MOUTH EVERY DAY 90 tablet 0  .  SYNTHROID 100 MCG tablet Take 100 mcg by mouth daily.   0   No facility-administered medications prior to visit.     Review of Systems:   Constitutional:   No  weight loss, night sweats,  Fevers, chills, fatigue, or  lassitude.  HEENT:   No headaches,  Difficulty swallowing,  Tooth/dental problems, or  Sore throat,                No sneezing, itching, ear ache, nasal congestion, post nasal drip,   CV:  No chest pain,  Orthopnea, PND, swelling in lower extremities, anasarca, dizziness, palpitations, syncope.   GI  No heartburn, indigestion, abdominal pain, nausea, vomiting, diarrhea, change in bowel habits, loss of appetite, bloody stools.   Resp: No shortness of breath with exertion or at rest.  No excess mucus, no productive cough,  No non-productive cough,  No coughing up of blood.  No change in color of mucus.  No wheezing.  No chest  wall deformity  Skin: no rash or lesions.  GU: no dysuria, change in color of urine, no urgency or frequency.  No flank pain, no hematuria   MS:  No joint pain or swelling.  No decreased range of motion.  No back pain.    Physical Exam  There were no vitals taken for this visit.  GEN: A/Ox3; pleasant , NAD, elderly    HEENT:  [place]/AT,   NOSE-clear, THROAT-clear, no lesions, no postnasal drip or exudate noted.   NECK:  Supple w/ fair ROM; no JVD; normal carotid impulses w/o bruits; no thyromegaly or nodules palpated; no lymphadenopathy.    RESP  Clear  P & A; w/o, wheezes/ rales/ or rhonchi. no accessory muscle use, no dullness to percussion  CARD:  RRR, no m/r/g, gf  peripheral edema, pulses intact, no cyanosis or clubbing.  GI:   Soft & nt; nml bowel sounds; no organomegaly or masses detected.   Musco: Warm bil, no deformities or joint swelling noted.   Neuro: alert, no focal deficits noted.    Skin: Warm, R lower leg - small ulcers along anterior shin     Lab Results:  CBC    Component Value Date/Time   WBC 8.3 05/16/2019 0936     RBC 5.50 05/16/2019 0936   HGB 15.1 05/16/2019 0936   HCT 45.5 05/16/2019 0936   PLT 207.0 05/16/2019 0936   MCV 82.6 05/16/2019 0936   MCH 27.5 11/23/2017 1059   MCHC 33.2 05/16/2019 0936   RDW 15.7 (H) 05/16/2019 0936   LYMPHSABS 1.0 05/16/2019 0936   MONOABS 0.6 05/16/2019 0936   EOSABS 0.1 05/16/2019 0936   BASOSABS 0.1 05/16/2019 0936    BMET    Component Value Date/Time   NA 142 06/02/2019 1644   K 4.1 06/02/2019 1644   CL 104 06/02/2019 1644   CO2 25 06/02/2019 1644   GLUCOSE 120 (H) 06/02/2019 1644   GLUCOSE 149 (H) 05/23/2019 1124   BUN 18 06/02/2019 1644   CREATININE 1.67 (H) 06/02/2019 1644   CREATININE 1.54 (H) 03/31/2017 1028   CALCIUM 9.1 06/02/2019 1644   GFRNONAA 37 (L) 06/02/2019 1644   GFRAA 43 (L) 06/02/2019 1644    BNP    Component Value Date/Time   BNP 865.9 (H) 11/23/2017 1059   BNP 596.6 (H) 03/31/2017 1028    ProBNP    Component Value Date/Time   PROBNP 2,862 (H) 06/02/2019 1644   PROBNP 1,067.0 (H) 05/23/2019 1124    Imaging: No results found.    PFT Results Latest Ref Rng & Units 03/25/2017  FVC-Pre L 2.58  FVC-Predicted Pre % 73  FVC-Post L 2.63  FVC-Predicted Post % 74  Pre FEV1/FVC % % 74  Post FEV1/FCV % % 83  FEV1-Pre L 1.91  FEV1-Predicted Pre % 75  FEV1-Post L 2.18  DLCO UNC% % 26  DLCO COR %Predicted % 48  TLC L 4.80  TLC % Predicted % 68  RV % Predicted % 77    No results found for: NITRICOXIDE      Assessment & Plan:   Chronic diastolic heart failure (HCC) Appears compensated without evidence of acute volume overload on exam  Pulmonary hypertension ([place]) Patient has known pulmonary hypertension with underlying congestive heart failure, emphysema and obstructive sleep apnea.  Most recent 2D echo in 2019 showed severe tricuspid valve regurgitation and grade 2 diastolic heart failure ,  Patient is to continue on oxygen and CPAP along with diuresis.  Continue follow-up with  cardiology.  Chronic  respiratory failure with hypoxia (HCC) Continue on oxygen O2 saturation goal greater than 88 to 90%.  Continue on nocturnal BiPAP.  OSA (obstructive sleep apnea) Excellent control and compliance on nocturnal CPAP no changes Plan  Patient Instructions  Continue on CPAP At bedtime  .  Continue on Lasix 60mg  Twice daily  .  Continue on oxygen 2 L with activity and at bedtime with CPAP Low salt diet  Legs elevated.  Follow up in 3-4 months with Dr. Hermina Staggers  And As needed   Continue to follow up with Cardiology as planned  Please contact office for sooner follow up if symptoms do not improve or worsen or seek emergency care          Rexene Edison, NP 01/15/2020

## 2020-01-15 NOTE — Assessment & Plan Note (Signed)
Appears compensated without evidence of acute volume overload on exam

## 2020-01-15 NOTE — Assessment & Plan Note (Signed)
Blood pressure is elevated today.  Blood pressure was checked twice.  Of advised him to discuss with primary care provider today that he needs a blood pressure follow-up.  Patient says he is compliant with his medications.  Advised on a low-salt diet.  Patient has no acute symptoms of hypertension.

## 2020-01-15 NOTE — Assessment & Plan Note (Signed)
Patient has known pulmonary hypertension with underlying congestive heart failure, emphysema and obstructive sleep apnea.  Most recent 2D echo in 2019 showed severe tricuspid valve regurgitation and grade 2 diastolic heart failure ,  Patient is to continue on oxygen and CPAP along with diuresis.  Continue follow-up with cardiology.

## 2020-01-15 NOTE — Assessment & Plan Note (Signed)
Excellent control and compliance on nocturnal CPAP no changes Plan  Patient Instructions  Continue on CPAP At bedtime  .  Continue on Lasix 60mg  Twice daily  .  Continue on oxygen 2 L with activity and at bedtime with CPAP Low salt diet  Legs elevated.  Follow up in 3-4 months with Dr. Hermina Staggers  And As needed   Continue to follow up with Cardiology as planned  Please contact office for sooner follow up if symptoms do not improve or worsen or seek emergency care

## 2020-01-15 NOTE — Assessment & Plan Note (Signed)
Continue on oxygen O2 saturation goal greater than 88 to 90%.  Continue on nocturnal BiPAP.

## 2020-01-15 NOTE — Patient Instructions (Addendum)
Continue on CPAP At bedtime  .  Continue on Lasix 60mg  Twice daily  .  Continue on oxygen 2 L with activity and at bedtime with CPAP Low salt diet  Legs elevated.  Follow up in 3-4 months with [name]  And As needed   Continue to follow up with Cardiology as planned  Please contact office for sooner follow up if symptoms do not improve or worsen or seek emergency care   Late add : Follow-up with primary care provider today as your blood pressure is elevated

## 2020-02-05 DIAGNOSIS — E118 Type 2 diabetes mellitus with unspecified complications: Secondary | ICD-10-CM | POA: Diagnosis not present

## 2020-02-05 DIAGNOSIS — E039 Hypothyroidism, unspecified: Secondary | ICD-10-CM | POA: Diagnosis not present

## 2020-02-05 DIAGNOSIS — I10 Essential (primary) hypertension: Secondary | ICD-10-CM | POA: Diagnosis not present

## 2020-02-05 DIAGNOSIS — E559 Vitamin D deficiency, unspecified: Secondary | ICD-10-CM | POA: Diagnosis not present

## 2020-02-09 ENCOUNTER — Other Ambulatory Visit: Payer: Self-pay | Admitting: Internal Medicine

## 2020-02-12 ENCOUNTER — Telehealth: Payer: Self-pay | Admitting: Adult Health

## 2020-02-12 DIAGNOSIS — E039 Hypothyroidism, unspecified: Secondary | ICD-10-CM | POA: Diagnosis not present

## 2020-02-12 DIAGNOSIS — E559 Vitamin D deficiency, unspecified: Secondary | ICD-10-CM | POA: Diagnosis not present

## 2020-02-12 DIAGNOSIS — I10 Essential (primary) hypertension: Secondary | ICD-10-CM | POA: Diagnosis not present

## 2020-02-12 DIAGNOSIS — E118 Type 2 diabetes mellitus with unspecified complications: Secondary | ICD-10-CM | POA: Diagnosis not present

## 2020-02-12 DIAGNOSIS — I25111 Atherosclerotic heart disease of native coronary artery with angina pectoris with documented spasm: Secondary | ICD-10-CM | POA: Diagnosis not present

## 2020-02-12 DIAGNOSIS — E1165 Type 2 diabetes mellitus with hyperglycemia: Secondary | ICD-10-CM | POA: Diagnosis not present

## 2020-02-12 DIAGNOSIS — E782 Mixed hyperlipidemia: Secondary | ICD-10-CM | POA: Diagnosis not present

## 2020-02-12 DIAGNOSIS — R17 Unspecified jaundice: Secondary | ICD-10-CM | POA: Diagnosis not present

## 2020-02-12 DIAGNOSIS — R609 Edema, unspecified: Secondary | ICD-10-CM | POA: Diagnosis not present

## 2020-02-12 NOTE — Telephone Encounter (Signed)
[name], have you seen this CMN? Please advise, thanks!

## 2020-02-13 NOTE — Telephone Encounter (Signed)
This CMN is in [name]'s CMN folder waiting for her to sign

## 2020-02-16 NOTE — Telephone Encounter (Signed)
This CMN was signed by Rexene Edison and faxed on 02/15/2020. I received confirmation that the fax was received

## 2020-02-16 NOTE — Telephone Encounter (Signed)
Keane Scrape can you let us know when it is signed?

## 2020-02-27 DIAGNOSIS — L98499 Non-pressure chronic ulcer of skin of other sites with unspecified severity: Secondary | ICD-10-CM | POA: Diagnosis not present

## 2020-03-01 DIAGNOSIS — N183 Chronic kidney disease, stage 3 unspecified: Secondary | ICD-10-CM | POA: Diagnosis not present

## 2020-03-01 DIAGNOSIS — Z951 Presence of aortocoronary bypass graft: Secondary | ICD-10-CM | POA: Diagnosis not present

## 2020-03-01 DIAGNOSIS — C641 Malignant neoplasm of right kidney, except renal pelvis: Secondary | ICD-10-CM | POA: Diagnosis not present

## 2020-03-01 DIAGNOSIS — Z794 Long term (current) use of insulin: Secondary | ICD-10-CM | POA: Diagnosis not present

## 2020-03-01 DIAGNOSIS — L97819 Non-pressure chronic ulcer of other part of right lower leg with unspecified severity: Secondary | ICD-10-CM | POA: Diagnosis not present

## 2020-03-01 DIAGNOSIS — E039 Hypothyroidism, unspecified: Secondary | ICD-10-CM | POA: Diagnosis not present

## 2020-03-01 DIAGNOSIS — Z4800 Encounter for change or removal of nonsurgical wound dressing: Secondary | ICD-10-CM | POA: Diagnosis not present

## 2020-03-01 DIAGNOSIS — I129 Hypertensive chronic kidney disease with stage 1 through stage 4 chronic kidney disease, or unspecified chronic kidney disease: Secondary | ICD-10-CM | POA: Diagnosis not present

## 2020-03-01 DIAGNOSIS — E1122 Type 2 diabetes mellitus with diabetic chronic kidney disease: Secondary | ICD-10-CM | POA: Diagnosis not present

## 2020-03-01 DIAGNOSIS — I2510 Atherosclerotic heart disease of native coronary artery without angina pectoris: Secondary | ICD-10-CM | POA: Diagnosis not present

## 2020-03-06 DIAGNOSIS — L97819 Non-pressure chronic ulcer of other part of right lower leg with unspecified severity: Secondary | ICD-10-CM | POA: Diagnosis not present

## 2020-03-06 DIAGNOSIS — I2510 Atherosclerotic heart disease of native coronary artery without angina pectoris: Secondary | ICD-10-CM | POA: Diagnosis not present

## 2020-03-06 DIAGNOSIS — E1122 Type 2 diabetes mellitus with diabetic chronic kidney disease: Secondary | ICD-10-CM | POA: Diagnosis not present

## 2020-03-06 DIAGNOSIS — I129 Hypertensive chronic kidney disease with stage 1 through stage 4 chronic kidney disease, or unspecified chronic kidney disease: Secondary | ICD-10-CM | POA: Diagnosis not present

## 2020-03-06 DIAGNOSIS — N183 Chronic kidney disease, stage 3 unspecified: Secondary | ICD-10-CM | POA: Diagnosis not present

## 2020-03-06 DIAGNOSIS — Z4800 Encounter for change or removal of nonsurgical wound dressing: Secondary | ICD-10-CM | POA: Diagnosis not present

## 2020-03-08 DIAGNOSIS — L97819 Non-pressure chronic ulcer of other part of right lower leg with unspecified severity: Secondary | ICD-10-CM | POA: Diagnosis not present

## 2020-03-08 DIAGNOSIS — I2510 Atherosclerotic heart disease of native coronary artery without angina pectoris: Secondary | ICD-10-CM | POA: Diagnosis not present

## 2020-03-08 DIAGNOSIS — I129 Hypertensive chronic kidney disease with stage 1 through stage 4 chronic kidney disease, or unspecified chronic kidney disease: Secondary | ICD-10-CM | POA: Diagnosis not present

## 2020-03-08 DIAGNOSIS — Z4800 Encounter for change or removal of nonsurgical wound dressing: Secondary | ICD-10-CM | POA: Diagnosis not present

## 2020-03-08 DIAGNOSIS — E1122 Type 2 diabetes mellitus with diabetic chronic kidney disease: Secondary | ICD-10-CM | POA: Diagnosis not present

## 2020-03-08 DIAGNOSIS — N183 Chronic kidney disease, stage 3 unspecified: Secondary | ICD-10-CM | POA: Diagnosis not present

## 2020-03-11 DIAGNOSIS — L97819 Non-pressure chronic ulcer of other part of right lower leg with unspecified severity: Secondary | ICD-10-CM | POA: Diagnosis not present

## 2020-03-11 DIAGNOSIS — E1122 Type 2 diabetes mellitus with diabetic chronic kidney disease: Secondary | ICD-10-CM | POA: Diagnosis not present

## 2020-03-11 DIAGNOSIS — N183 Chronic kidney disease, stage 3 unspecified: Secondary | ICD-10-CM | POA: Diagnosis not present

## 2020-03-11 DIAGNOSIS — I2510 Atherosclerotic heart disease of native coronary artery without angina pectoris: Secondary | ICD-10-CM | POA: Diagnosis not present

## 2020-03-11 DIAGNOSIS — Z4800 Encounter for change or removal of nonsurgical wound dressing: Secondary | ICD-10-CM | POA: Diagnosis not present

## 2020-03-11 DIAGNOSIS — I129 Hypertensive chronic kidney disease with stage 1 through stage 4 chronic kidney disease, or unspecified chronic kidney disease: Secondary | ICD-10-CM | POA: Diagnosis not present

## 2020-03-12 DIAGNOSIS — L97819 Non-pressure chronic ulcer of other part of right lower leg with unspecified severity: Secondary | ICD-10-CM | POA: Diagnosis not present

## 2020-03-12 DIAGNOSIS — L98499 Non-pressure chronic ulcer of skin of other sites with unspecified severity: Secondary | ICD-10-CM | POA: Diagnosis not present

## 2020-03-12 DIAGNOSIS — Z4800 Encounter for change or removal of nonsurgical wound dressing: Secondary | ICD-10-CM | POA: Diagnosis not present

## 2020-03-12 DIAGNOSIS — E1122 Type 2 diabetes mellitus with diabetic chronic kidney disease: Secondary | ICD-10-CM | POA: Diagnosis not present

## 2020-03-12 DIAGNOSIS — I2510 Atherosclerotic heart disease of native coronary artery without angina pectoris: Secondary | ICD-10-CM | POA: Diagnosis not present

## 2020-03-14 DIAGNOSIS — N183 Chronic kidney disease, stage 3 unspecified: Secondary | ICD-10-CM | POA: Diagnosis not present

## 2020-03-14 DIAGNOSIS — E1122 Type 2 diabetes mellitus with diabetic chronic kidney disease: Secondary | ICD-10-CM | POA: Diagnosis not present

## 2020-03-14 DIAGNOSIS — Z4800 Encounter for change or removal of nonsurgical wound dressing: Secondary | ICD-10-CM | POA: Diagnosis not present

## 2020-03-14 DIAGNOSIS — I2510 Atherosclerotic heart disease of native coronary artery without angina pectoris: Secondary | ICD-10-CM | POA: Diagnosis not present

## 2020-03-14 DIAGNOSIS — L97819 Non-pressure chronic ulcer of other part of right lower leg with unspecified severity: Secondary | ICD-10-CM | POA: Diagnosis not present

## 2020-03-14 DIAGNOSIS — I129 Hypertensive chronic kidney disease with stage 1 through stage 4 chronic kidney disease, or unspecified chronic kidney disease: Secondary | ICD-10-CM | POA: Diagnosis not present

## 2020-03-18 ENCOUNTER — Other Ambulatory Visit: Payer: Self-pay | Admitting: Cardiology

## 2020-03-19 ENCOUNTER — Other Ambulatory Visit: Payer: Self-pay | Admitting: Internal Medicine

## 2020-03-19 DIAGNOSIS — L97819 Non-pressure chronic ulcer of other part of right lower leg with unspecified severity: Secondary | ICD-10-CM | POA: Diagnosis not present

## 2020-03-19 DIAGNOSIS — I2510 Atherosclerotic heart disease of native coronary artery without angina pectoris: Secondary | ICD-10-CM | POA: Diagnosis not present

## 2020-03-19 DIAGNOSIS — Z4800 Encounter for change or removal of nonsurgical wound dressing: Secondary | ICD-10-CM | POA: Diagnosis not present

## 2020-03-19 DIAGNOSIS — E1122 Type 2 diabetes mellitus with diabetic chronic kidney disease: Secondary | ICD-10-CM | POA: Diagnosis not present

## 2020-03-19 DIAGNOSIS — N183 Chronic kidney disease, stage 3 unspecified: Secondary | ICD-10-CM | POA: Diagnosis not present

## 2020-03-19 DIAGNOSIS — I129 Hypertensive chronic kidney disease with stage 1 through stage 4 chronic kidney disease, or unspecified chronic kidney disease: Secondary | ICD-10-CM | POA: Diagnosis not present

## 2020-03-21 DIAGNOSIS — L97819 Non-pressure chronic ulcer of other part of right lower leg with unspecified severity: Secondary | ICD-10-CM | POA: Diagnosis not present

## 2020-03-21 DIAGNOSIS — E1122 Type 2 diabetes mellitus with diabetic chronic kidney disease: Secondary | ICD-10-CM | POA: Diagnosis not present

## 2020-03-21 DIAGNOSIS — N183 Chronic kidney disease, stage 3 unspecified: Secondary | ICD-10-CM | POA: Diagnosis not present

## 2020-03-21 DIAGNOSIS — I129 Hypertensive chronic kidney disease with stage 1 through stage 4 chronic kidney disease, or unspecified chronic kidney disease: Secondary | ICD-10-CM | POA: Diagnosis not present

## 2020-03-21 DIAGNOSIS — I2510 Atherosclerotic heart disease of native coronary artery without angina pectoris: Secondary | ICD-10-CM | POA: Diagnosis not present

## 2020-03-21 DIAGNOSIS — Z4800 Encounter for change or removal of nonsurgical wound dressing: Secondary | ICD-10-CM | POA: Diagnosis not present

## 2020-03-25 DIAGNOSIS — I2510 Atherosclerotic heart disease of native coronary artery without angina pectoris: Secondary | ICD-10-CM | POA: Diagnosis not present

## 2020-03-25 DIAGNOSIS — L97819 Non-pressure chronic ulcer of other part of right lower leg with unspecified severity: Secondary | ICD-10-CM | POA: Diagnosis not present

## 2020-03-25 DIAGNOSIS — E1122 Type 2 diabetes mellitus with diabetic chronic kidney disease: Secondary | ICD-10-CM | POA: Diagnosis not present

## 2020-03-25 DIAGNOSIS — Z4800 Encounter for change or removal of nonsurgical wound dressing: Secondary | ICD-10-CM | POA: Diagnosis not present

## 2020-03-25 DIAGNOSIS — I129 Hypertensive chronic kidney disease with stage 1 through stage 4 chronic kidney disease, or unspecified chronic kidney disease: Secondary | ICD-10-CM | POA: Diagnosis not present

## 2020-03-25 DIAGNOSIS — N183 Chronic kidney disease, stage 3 unspecified: Secondary | ICD-10-CM | POA: Diagnosis not present

## 2020-03-28 DIAGNOSIS — L97819 Non-pressure chronic ulcer of other part of right lower leg with unspecified severity: Secondary | ICD-10-CM | POA: Diagnosis not present

## 2020-03-28 DIAGNOSIS — I129 Hypertensive chronic kidney disease with stage 1 through stage 4 chronic kidney disease, or unspecified chronic kidney disease: Secondary | ICD-10-CM | POA: Diagnosis not present

## 2020-03-28 DIAGNOSIS — I2510 Atherosclerotic heart disease of native coronary artery without angina pectoris: Secondary | ICD-10-CM | POA: Diagnosis not present

## 2020-03-28 DIAGNOSIS — Z4800 Encounter for change or removal of nonsurgical wound dressing: Secondary | ICD-10-CM | POA: Diagnosis not present

## 2020-03-28 DIAGNOSIS — N183 Chronic kidney disease, stage 3 unspecified: Secondary | ICD-10-CM | POA: Diagnosis not present

## 2020-03-28 DIAGNOSIS — E1122 Type 2 diabetes mellitus with diabetic chronic kidney disease: Secondary | ICD-10-CM | POA: Diagnosis not present

## 2020-03-30 ENCOUNTER — Other Ambulatory Visit: Payer: Self-pay | Admitting: Internal Medicine

## 2020-03-31 DIAGNOSIS — C641 Malignant neoplasm of right kidney, except renal pelvis: Secondary | ICD-10-CM | POA: Diagnosis not present

## 2020-03-31 DIAGNOSIS — Z794 Long term (current) use of insulin: Secondary | ICD-10-CM | POA: Diagnosis not present

## 2020-03-31 DIAGNOSIS — E1122 Type 2 diabetes mellitus with diabetic chronic kidney disease: Secondary | ICD-10-CM | POA: Diagnosis not present

## 2020-03-31 DIAGNOSIS — Z951 Presence of aortocoronary bypass graft: Secondary | ICD-10-CM | POA: Diagnosis not present

## 2020-03-31 DIAGNOSIS — I2510 Atherosclerotic heart disease of native coronary artery without angina pectoris: Secondary | ICD-10-CM | POA: Diagnosis not present

## 2020-03-31 DIAGNOSIS — I129 Hypertensive chronic kidney disease with stage 1 through stage 4 chronic kidney disease, or unspecified chronic kidney disease: Secondary | ICD-10-CM | POA: Diagnosis not present

## 2020-03-31 DIAGNOSIS — L97819 Non-pressure chronic ulcer of other part of right lower leg with unspecified severity: Secondary | ICD-10-CM | POA: Diagnosis not present

## 2020-03-31 DIAGNOSIS — Z4800 Encounter for change or removal of nonsurgical wound dressing: Secondary | ICD-10-CM | POA: Diagnosis not present

## 2020-03-31 DIAGNOSIS — E039 Hypothyroidism, unspecified: Secondary | ICD-10-CM | POA: Diagnosis not present

## 2020-03-31 DIAGNOSIS — N183 Chronic kidney disease, stage 3 unspecified: Secondary | ICD-10-CM | POA: Diagnosis not present

## 2020-04-01 DIAGNOSIS — I2510 Atherosclerotic heart disease of native coronary artery without angina pectoris: Secondary | ICD-10-CM | POA: Diagnosis not present

## 2020-04-01 DIAGNOSIS — L97819 Non-pressure chronic ulcer of other part of right lower leg with unspecified severity: Secondary | ICD-10-CM | POA: Diagnosis not present

## 2020-04-01 DIAGNOSIS — N183 Chronic kidney disease, stage 3 unspecified: Secondary | ICD-10-CM | POA: Diagnosis not present

## 2020-04-01 DIAGNOSIS — E1122 Type 2 diabetes mellitus with diabetic chronic kidney disease: Secondary | ICD-10-CM | POA: Diagnosis not present

## 2020-04-01 DIAGNOSIS — I129 Hypertensive chronic kidney disease with stage 1 through stage 4 chronic kidney disease, or unspecified chronic kidney disease: Secondary | ICD-10-CM | POA: Diagnosis not present

## 2020-04-01 DIAGNOSIS — Z4800 Encounter for change or removal of nonsurgical wound dressing: Secondary | ICD-10-CM | POA: Diagnosis not present

## 2020-04-02 ENCOUNTER — Other Ambulatory Visit: Payer: Self-pay | Admitting: Internal Medicine

## 2020-04-04 ENCOUNTER — Other Ambulatory Visit: Payer: Self-pay | Admitting: Internal Medicine

## 2020-04-04 DIAGNOSIS — N183 Chronic kidney disease, stage 3 unspecified: Secondary | ICD-10-CM | POA: Diagnosis not present

## 2020-04-04 DIAGNOSIS — I2510 Atherosclerotic heart disease of native coronary artery without angina pectoris: Secondary | ICD-10-CM | POA: Diagnosis not present

## 2020-04-04 DIAGNOSIS — Z4800 Encounter for change or removal of nonsurgical wound dressing: Secondary | ICD-10-CM | POA: Diagnosis not present

## 2020-04-04 DIAGNOSIS — I129 Hypertensive chronic kidney disease with stage 1 through stage 4 chronic kidney disease, or unspecified chronic kidney disease: Secondary | ICD-10-CM | POA: Diagnosis not present

## 2020-04-04 DIAGNOSIS — L97819 Non-pressure chronic ulcer of other part of right lower leg with unspecified severity: Secondary | ICD-10-CM | POA: Diagnosis not present

## 2020-04-04 DIAGNOSIS — E1122 Type 2 diabetes mellitus with diabetic chronic kidney disease: Secondary | ICD-10-CM | POA: Diagnosis not present

## 2020-04-08 DIAGNOSIS — I129 Hypertensive chronic kidney disease with stage 1 through stage 4 chronic kidney disease, or unspecified chronic kidney disease: Secondary | ICD-10-CM | POA: Diagnosis not present

## 2020-04-08 DIAGNOSIS — N183 Chronic kidney disease, stage 3 unspecified: Secondary | ICD-10-CM | POA: Diagnosis not present

## 2020-04-08 DIAGNOSIS — Z4800 Encounter for change or removal of nonsurgical wound dressing: Secondary | ICD-10-CM | POA: Diagnosis not present

## 2020-04-08 DIAGNOSIS — E1122 Type 2 diabetes mellitus with diabetic chronic kidney disease: Secondary | ICD-10-CM | POA: Diagnosis not present

## 2020-04-08 DIAGNOSIS — L97819 Non-pressure chronic ulcer of other part of right lower leg with unspecified severity: Secondary | ICD-10-CM | POA: Diagnosis not present

## 2020-04-08 DIAGNOSIS — I2510 Atherosclerotic heart disease of native coronary artery without angina pectoris: Secondary | ICD-10-CM | POA: Diagnosis not present

## 2020-04-12 DIAGNOSIS — Z4800 Encounter for change or removal of nonsurgical wound dressing: Secondary | ICD-10-CM | POA: Diagnosis not present

## 2020-04-12 DIAGNOSIS — I2510 Atherosclerotic heart disease of native coronary artery without angina pectoris: Secondary | ICD-10-CM | POA: Diagnosis not present

## 2020-04-12 DIAGNOSIS — L97819 Non-pressure chronic ulcer of other part of right lower leg with unspecified severity: Secondary | ICD-10-CM | POA: Diagnosis not present

## 2020-04-12 DIAGNOSIS — N183 Chronic kidney disease, stage 3 unspecified: Secondary | ICD-10-CM | POA: Diagnosis not present

## 2020-04-12 DIAGNOSIS — E1122 Type 2 diabetes mellitus with diabetic chronic kidney disease: Secondary | ICD-10-CM | POA: Diagnosis not present

## 2020-04-12 DIAGNOSIS — I129 Hypertensive chronic kidney disease with stage 1 through stage 4 chronic kidney disease, or unspecified chronic kidney disease: Secondary | ICD-10-CM | POA: Diagnosis not present

## 2020-04-16 DIAGNOSIS — E1122 Type 2 diabetes mellitus with diabetic chronic kidney disease: Secondary | ICD-10-CM | POA: Diagnosis not present

## 2020-04-16 DIAGNOSIS — I2510 Atherosclerotic heart disease of native coronary artery without angina pectoris: Secondary | ICD-10-CM | POA: Diagnosis not present

## 2020-04-16 DIAGNOSIS — L97819 Non-pressure chronic ulcer of other part of right lower leg with unspecified severity: Secondary | ICD-10-CM | POA: Diagnosis not present

## 2020-04-16 DIAGNOSIS — N183 Chronic kidney disease, stage 3 unspecified: Secondary | ICD-10-CM | POA: Diagnosis not present

## 2020-04-16 DIAGNOSIS — I129 Hypertensive chronic kidney disease with stage 1 through stage 4 chronic kidney disease, or unspecified chronic kidney disease: Secondary | ICD-10-CM | POA: Diagnosis not present

## 2020-04-16 DIAGNOSIS — Z4800 Encounter for change or removal of nonsurgical wound dressing: Secondary | ICD-10-CM | POA: Diagnosis not present

## 2020-04-19 ENCOUNTER — Telehealth: Payer: Self-pay

## 2020-04-19 DIAGNOSIS — Z4800 Encounter for change or removal of nonsurgical wound dressing: Secondary | ICD-10-CM | POA: Diagnosis not present

## 2020-04-19 DIAGNOSIS — I129 Hypertensive chronic kidney disease with stage 1 through stage 4 chronic kidney disease, or unspecified chronic kidney disease: Secondary | ICD-10-CM | POA: Diagnosis not present

## 2020-04-19 DIAGNOSIS — E1122 Type 2 diabetes mellitus with diabetic chronic kidney disease: Secondary | ICD-10-CM | POA: Diagnosis not present

## 2020-04-19 DIAGNOSIS — L97819 Non-pressure chronic ulcer of other part of right lower leg with unspecified severity: Secondary | ICD-10-CM | POA: Diagnosis not present

## 2020-04-19 DIAGNOSIS — I2510 Atherosclerotic heart disease of native coronary artery without angina pectoris: Secondary | ICD-10-CM | POA: Diagnosis not present

## 2020-04-19 DIAGNOSIS — N183 Chronic kidney disease, stage 3 unspecified: Secondary | ICD-10-CM | POA: Diagnosis not present

## 2020-04-19 NOTE — Telephone Encounter (Signed)
Error

## 2020-04-21 ENCOUNTER — Emergency Department (HOSPITAL_COMMUNITY)
Admission: EM | Admit: 2020-04-21 | Discharge: 2020-04-21 | Disposition: A | Discharge status: Home / Self Care | Payer: Medicare Other | Attending: Emergency Medicine | Admitting: Emergency Medicine

## 2020-04-21 ENCOUNTER — Emergency Department (HOSPITAL_COMMUNITY): Payer: Medicare Other

## 2020-04-21 ENCOUNTER — Other Ambulatory Visit: Payer: Self-pay

## 2020-04-21 ENCOUNTER — Encounter (HOSPITAL_COMMUNITY): Payer: Self-pay | Admitting: Emergency Medicine

## 2020-04-21 DIAGNOSIS — Z79899 Other long term (current) drug therapy: Secondary | ICD-10-CM | POA: Diagnosis not present

## 2020-04-21 DIAGNOSIS — Z7982 Long term (current) use of aspirin: Secondary | ICD-10-CM | POA: Insufficient documentation

## 2020-04-21 DIAGNOSIS — I10 Essential (primary) hypertension: Secondary | ICD-10-CM | POA: Diagnosis not present

## 2020-04-21 DIAGNOSIS — R0902 Hypoxemia: Secondary | ICD-10-CM | POA: Diagnosis not present

## 2020-04-21 DIAGNOSIS — Z794 Long term (current) use of insulin: Secondary | ICD-10-CM | POA: Diagnosis not present

## 2020-04-21 DIAGNOSIS — Z87891 Personal history of nicotine dependence: Secondary | ICD-10-CM | POA: Insufficient documentation

## 2020-04-21 DIAGNOSIS — I252 Old myocardial infarction: Secondary | ICD-10-CM | POA: Diagnosis not present

## 2020-04-21 DIAGNOSIS — R0602 Shortness of breath: Secondary | ICD-10-CM | POA: Insufficient documentation

## 2020-04-21 DIAGNOSIS — J449 Chronic obstructive pulmonary disease, unspecified: Secondary | ICD-10-CM | POA: Insufficient documentation

## 2020-04-21 DIAGNOSIS — I130 Hypertensive heart and chronic kidney disease with heart failure and stage 1 through stage 4 chronic kidney disease, or unspecified chronic kidney disease: Secondary | ICD-10-CM | POA: Diagnosis present

## 2020-04-21 DIAGNOSIS — N183 Chronic kidney disease, stage 3 unspecified: Secondary | ICD-10-CM | POA: Insufficient documentation

## 2020-04-21 DIAGNOSIS — Z951 Presence of aortocoronary bypass graft: Secondary | ICD-10-CM | POA: Diagnosis not present

## 2020-04-21 DIAGNOSIS — I5032 Chronic diastolic (congestive) heart failure: Secondary | ICD-10-CM | POA: Diagnosis not present

## 2020-04-21 DIAGNOSIS — R001 Bradycardia, unspecified: Secondary | ICD-10-CM | POA: Insufficient documentation

## 2020-04-21 DIAGNOSIS — E1122 Type 2 diabetes mellitus with diabetic chronic kidney disease: Secondary | ICD-10-CM | POA: Insufficient documentation

## 2020-04-21 LAB — CBC
HCT: 48.8 % (ref 39.0–52.0)
Hemoglobin: 15.5 g/dL (ref 13.0–17.0)
MCH: 27.2 pg (ref 26.0–34.0)
MCHC: 31.8 g/dL (ref 30.0–36.0)
MCV: 85.8 fL (ref 80.0–100.0)
Platelets: 176 10*3/uL (ref 150–400)
RBC: 5.69 MIL/uL (ref 4.22–5.81)
RDW: 15.9 % — ABNORMAL HIGH (ref 11.5–15.5)
WBC: 7.5 10*3/uL (ref 4.0–10.5)
nRBC: 0 % (ref 0.0–0.2)

## 2020-04-21 LAB — BASIC METABOLIC PANEL
Anion gap: 8 (ref 5–15)
BUN: 24 mg/dL — ABNORMAL HIGH (ref 8–23)
CO2: 26 mmol/L (ref 22–32)
Calcium: 9.1 mg/dL (ref 8.9–10.3)
Chloride: 108 mmol/L (ref 98–111)
Creatinine, Ser: 1.89 mg/dL — ABNORMAL HIGH (ref 0.61–1.24)
GFR calc Af Amer: 37 mL/min — ABNORMAL LOW (ref >60)
GFR calc non Af Amer: 32 mL/min — ABNORMAL LOW (ref >60)
Glucose, Bld: 103 mg/dL — ABNORMAL HIGH (ref 70–99)
Potassium: 4.2 mmol/L (ref 3.5–5.1)
Sodium: 142 mmol/L (ref 135–145)

## 2020-04-21 LAB — TROPONIN I (HIGH SENSITIVITY)
Troponin I (High Sensitivity): 50 ng/L — ABNORMAL HIGH (ref <18)
Troponin I (High Sensitivity): 45 ng/L — ABNORMAL HIGH (ref <18)

## 2020-04-21 MED ORDER — SODIUM CHLORIDE 0.9% FLUSH: 3.0000 mL | Freq: Once | INTRAVENOUS | Status: DC

## 2020-04-21 NOTE — ED Triage Notes (Signed)
Pt BIB [facility], c/o hypertension over the last few weeks. Pt has a home health nurse who helps him with his medications, states some of his BP meds have recently changed. Pt also c/o shortness of breath with exertion. Denies headache/weakness.

## 2020-04-21 NOTE — Discharge Instructions (Signed)
Return if any problems.  Follow up with your Physician for recheck.  Return if any problems.

## 2020-04-21 NOTE — ED Notes (Signed)
Pt placed on 2L of O2. 

## 2020-04-21 NOTE — ED Provider Notes (Signed)
[facility] Provider Note   CSN: [id number] Arrival date & time: 04/21/20  1713     History Chief Complaint  Patient presents with  . Hypertension  . Shortness of Breath    [name] is a 86 y.o. male.  [name] history is provided by [name] patient. No language interpreter was used.  Hypertension This is a recurrent problem. [name] current episode started 2 days ago. [name] problem occurs constantly. [name] problem has been gradually worsening. Pertinent negatives include no shortness of breath. Nothing aggravates [name] symptoms. Nothing relieves [name] symptoms. He has tried nothing for [name] symptoms. [name] treatment provided no relief.  Shortness of Breath  Pt reports home wound nurse told him he should come to [name] ED on Friday because his blood pressure was high.  Pt denies any shortness of breath. No chest pain.     Past Medical History:  Diagnosis Date  . Chronic kidney disease    tumor right kidney  . Coronary artery disease   . Diabetes mellitus    type 2  . Hypertension   . Myocardial infarction ([place])    1997  . Right renal mass     Patient Active Problem List   Diagnosis Date Noted  . OSA (obstructive sleep apnea) 05/15/2019  . Acute on chronic diastolic heart failure ([place]) 05/06/2018  . Pleural effusion 05/06/2018  . COPD (chronic obstructive pulmonary disease) with emphysema ([place]) 04/29/2017  . Lung nodule 04/29/2017  . Hx of CABG 04/08/2017  . Chronic respiratory failure with hypoxia ([place]) 03/23/2017  . Pulmonary hypertension ([place]) 03/22/2017  . Shortness of breath 03/22/2017  . Pericardial effusion 03/22/2017  . Chronic diastolic heart failure ([place]) 02/25/2017  . Acute postoperative respiratory failure ([place]) 02/19/2017  . Chronic cholecystitis with calculus s/p robotic cholecystectomy 02/19/2017 02/19/2017  . Acute kidney injury superimposed on chronic kidney disease ([place]) 11/18/2016  . Nausea vomiting and diarrhea 11/18/2016  .  Renal mass s/p right partial nephrectomy 02/19/2017 11/18/2016  . Gastroenteritis 11/18/2016  . Insulin dependent diabetes mellitus with complications 72/08/4708  . Elevated transaminase level 11/18/2016  . Dyslipidemia 05/12/2013  . Syncope 01/14/2012  . Hypoglycemia 01/14/2012  . Stage 3 chronic kidney disease ([place]) 01/14/2012  . Essential hypertension 01/14/2012    Past Surgical History:  Procedure Laterality Date  . CARDIAC CATHETERIZATION  1996  . CORONARY ARTERY BYPASS GRAFT    . DOPPLER ECHOCARDIOGRAPHY  2011  . HERNIA REPAIR    . IR GENERIC HISTORICAL  12/09/2016   IR RADIOLOGIST EVAL & MGMT 12/09/2016 [name], MD [facility] INTERV RAD  . ROBOTIC ASSITED PARTIAL NEPHRECTOMY Right 02/19/2017   Procedure: XI ROBOTIC ASSITED PARTIAL NEPHRECTOMY;  Surgeon: [name], MD;  Location: [facility];  Service: Urology;  Laterality: Right;       Family History  Problem Relation Age of Onset  . Diabetes Mother   . Diabetes Sister     Social History   Tobacco Use  . Smoking status: Former Smoker    Packs/day: 0.30    Years: 45.00    Pack years: 13.50    Types: Cigarettes    Quit date: 02/15/1997    Years since quitting: 23.1  . Smokeless tobacco: Never Used  Substance Use Topics  . Alcohol use: No  . Drug use: No    Home Medications Prior to Admission medications   Medication Sig Start Date End Date Taking? Authorizing Provider  acetaminophen (TYLENOL) 325 MG tablet Take 650 mg by  mouth every 6 (six) hours as needed (for pain.).    [provider]  amLODipine (NORVASC) 5 MG tablet Take 5 mg by mouth daily.  10/04/15   [provider]  aspirin 81 MG EC tablet Take 81 mg by mouth daily. Swallow whole.    [provider]  benazepril (LOTENSIN) 40 MG tablet Take 40 mg by mouth daily. 01/14/17   [provider]  cloNIDine (CATAPRES) 0.1 MG tablet Take 0.1 mg by mouth 2 (two) times daily.    [provider]  ezetimibe (ZETIA) 10 MG  tablet Take 10 mg by mouth daily.    [provider]  furosemide (LASIX) 20 MG tablet TAKE 3 TABLETS(60 MG) BY MOUTH TWICE DAILY 04/04/20   Hilty, Nadean Corwin, MD  HUMALOG KWIKPEN 100 UNIT/ML KiwkPen Inject 6-8 Units into [name] skin 3 (three) times daily.  07/25/15   [provider]  Icosapent Ethyl (VASCEPA) 1 g CAPS Take 2 capsules (2 g total) by mouth 2 (two) times daily. 09/05/19   Hilty, Nadean Corwin, MD  LANTUS SOLOSTAR 100 UNIT/ML Solostar Pen Inject 23 Units into [name] skin every evening. 02/23/19   [provider]  nadolol (CORGARD) 40 MG tablet Take 40 mg by mouth daily.    [provider]  potassium chloride (KLOR-CON) 10 MEQ tablet TAKE 2 TABLETS(20 MEQ) BY MOUTH TWICE DAILY 03/18/20   Kerin Ransom K, PA-C  rosuvastatin (CRESTOR) 20 MG tablet TAKE 1 TABLET BY MOUTH EVERY DAY 02/09/20   Hilty, Nadean Corwin, MD  SYNTHROID 100 MCG tablet Take 100 mcg by mouth daily.  10/17/15   [provider]    Allergies    Patient has no known allergies.  Review of Systems   Review of Systems  Respiratory: Negative for shortness of breath.   All other systems reviewed and are negative.   Physical Exam Updated Vital Signs BP (!) 150/89   Pulse (!) 48   Temp 98.6 F (37 C) (Oral)   Resp 17   SpO2 98%   Physical Exam Vitals and nursing note reviewed.  Constitutional:      Appearance: He is well-developed.  HENT:     Head: Normocephalic and atraumatic.     Mouth/Throat:     Mouth: Mucous membranes are moist.  Eyes:     Conjunctiva/sclera: Conjunctivae normal.     Pupils: Pupils are equal, round, and reactive to light.  Cardiovascular:     Rate and Rhythm: Normal rate and regular rhythm.     Heart sounds: No murmur.  Pulmonary:     Effort: Pulmonary effort is normal. No respiratory distress.     Breath sounds: Normal breath sounds. No decreased breath sounds.  Chest:     Chest wall: No mass or tenderness.  Abdominal:     Palpations: Abdomen is soft.      Tenderness: There is no abdominal tenderness.  Musculoskeletal:     Cervical back: Normal range of motion and neck supple.  Skin:    General: Skin is warm and dry.  Neurological:     General: No focal deficit present.     Mental Status: He is alert.  Psychiatric:        Mood and Affect: Mood normal.     ED Results / Procedures / Treatments   Labs (all labs ordered are listed, but only abnormal results are displayed) Labs Reviewed  BASIC METABOLIC PANEL - Abnormal; Notable for [name] following components:      Result  Value   Glucose, Bld 103 (*)    BUN 24 (*)    Creatinine, Ser 1.89 (*)    GFR calc non Af Amer 32 (*)    GFR calc Af Amer 37 (*)    All other components within normal limits  CBC - Abnormal; Notable for [name] following components:   RDW 15.9 (*)    All other components within normal limits  TROPONIN I (HIGH SENSITIVITY) - Abnormal; Notable for [name] following components:   Troponin I (High Sensitivity) 50 (*)    All other components within normal limits  TROPONIN I (HIGH SENSITIVITY) - Abnormal; Notable for [name] following components:   Troponin I (High Sensitivity) 45 (*)    All other components within normal limits    EKG EKG Interpretation  Date/Time:  Sunday Apr 21 2020 17:22:32 EDT Ventricular Rate:  51 PR Interval:  194 QRS Duration: 100 QT Interval:  480 QTC Calculation: 442 R Axis:   66 Text Interpretation: Sinus bradycardia Possible Anterior infarct , age undetermined Abnormal ECG No STEMI Confirmed by Octaviano Glow [phone]) on 04/21/2020 7:20:28 PM   Radiology DG Chest 2 View  Result Date: 04/21/2020 CLINICAL DATA:  Shortness of breath. EXAM: CHEST - 2 VIEW COMPARISON:  09/05/2019 FINDINGS: Post median sternotomy and CABG. Cardiomegaly. Bilateral pleural effusions which have increased from prior exam, left greater than right. Volume loss at [name] left lung base with opacity and air bronchograms, likely compressive atelectasis. Progressive interstitial  thickening consistent with pulmonary edema. No pneumothorax. Bones are diffusely under mineralized. IMPRESSION: 1. CHF with increasing pulmonary edema and bilateral pleural effusions, left greater than right. 2. Cardiomegaly post CABG. Electronically Signed   By: Keith Rake M.D.   On: 04/21/2020 17:44    Procedures Procedures (including critical care time)  Medications Ordered in ED Medications  sodium chloride flush (NS) 0.9 % injection 3 mL (has no administration in time range)    ED Course  I have reviewed [name] triage vital signs and [name] nursing notes.  Pertinent labs & imaging results that were available during my care of [name] patient were reviewed by me and considered in my medical decision making (see chart for details).  Clinical Course as of Apr 22 2131  Nancy Fetter Apr 21, 2020  1949 Troponin I (High Sensitivity)(!) [LS]  5018 86 year old male presenting to emergency department with home nurse concern for bradycardia.  Patient was told he needs come to [name] emerge department on his home nurses because she said "my heart rate was too low".  He denies any symptoms.  Denies lightheadedness.  He has chronic dyspnea and a history of diastolic heart failure, for which he follows with pulmonology and cardiology, but no acute changes.  He also has some chronic edema of [name] lower extremities and has his legs wrapped.  He has home care that comes changes wrappings twice a week.  On exam [name] patient appears extremely comfortable.  He is on his baseline 2 L nasal cannula.  He does have some crackles in his lower lung fields and pleural effusion noted on [name] x-ray, which has had on prior scans.  I suspect this is all chronic.  EKG shows a junctional rhythm with a heart rate around 50 bpm.  Looking at prior EKGs this appears to be chronic heart rate.  Do not believe this is symptomatic or concerning bradycardia.  His initial trope was ordered at triage which was mildly elevated at 50, pending a second  troponin.  I am  doubtful of ACS.  Suspect likely 2/2 chronic CHF   [MT]    Clinical Course User Index [LS] Fransico Meadow, PA-C [MT] Langston Masker Carola Rhine, MD   MDM Rules/Calculators/A&P                      MD Pt looks good.  Pt has a history of bradycardia. Blood pressure looks good : troponin is negative x 2.   Final Clinical Impression(s) / ED Diagnoses Final diagnoses:  Bradycardia    Rx / DC Orders ED Discharge Orders    None       Sidney Ace 04/21/20 2136    Wyvonnia Dusky, MD 04/21/20 [phone]

## 2020-04-21 NOTE — ED Notes (Signed)
Patient verbalizes understanding of discharge instructions. Opportunity for questioning and answers were provided. Armband removed by staff, pt discharged from ED via wheelchair.  

## 2020-04-22 DIAGNOSIS — L97819 Non-pressure chronic ulcer of other part of right lower leg with unspecified severity: Secondary | ICD-10-CM | POA: Diagnosis not present

## 2020-04-22 DIAGNOSIS — I129 Hypertensive chronic kidney disease with stage 1 through stage 4 chronic kidney disease, or unspecified chronic kidney disease: Secondary | ICD-10-CM | POA: Diagnosis not present

## 2020-04-22 DIAGNOSIS — N183 Chronic kidney disease, stage 3 unspecified: Secondary | ICD-10-CM | POA: Diagnosis not present

## 2020-04-22 DIAGNOSIS — I2510 Atherosclerotic heart disease of native coronary artery without angina pectoris: Secondary | ICD-10-CM | POA: Diagnosis not present

## 2020-04-22 DIAGNOSIS — Z4800 Encounter for change or removal of nonsurgical wound dressing: Secondary | ICD-10-CM | POA: Diagnosis not present

## 2020-04-22 DIAGNOSIS — E1122 Type 2 diabetes mellitus with diabetic chronic kidney disease: Secondary | ICD-10-CM | POA: Diagnosis not present

## 2020-04-24 ENCOUNTER — Other Ambulatory Visit: Payer: Self-pay | Admitting: Cardiology

## 2020-04-25 DIAGNOSIS — L97819 Non-pressure chronic ulcer of other part of right lower leg with unspecified severity: Secondary | ICD-10-CM | POA: Diagnosis not present

## 2020-04-25 DIAGNOSIS — I2510 Atherosclerotic heart disease of native coronary artery without angina pectoris: Secondary | ICD-10-CM | POA: Diagnosis not present

## 2020-04-25 DIAGNOSIS — Z4800 Encounter for change or removal of nonsurgical wound dressing: Secondary | ICD-10-CM | POA: Diagnosis not present

## 2020-04-25 DIAGNOSIS — N183 Chronic kidney disease, stage 3 unspecified: Secondary | ICD-10-CM | POA: Diagnosis not present

## 2020-04-25 DIAGNOSIS — E1122 Type 2 diabetes mellitus with diabetic chronic kidney disease: Secondary | ICD-10-CM | POA: Diagnosis not present

## 2020-04-25 DIAGNOSIS — I129 Hypertensive chronic kidney disease with stage 1 through stage 4 chronic kidney disease, or unspecified chronic kidney disease: Secondary | ICD-10-CM | POA: Diagnosis not present

## 2020-04-30 DIAGNOSIS — C641 Malignant neoplasm of right kidney, except renal pelvis: Secondary | ICD-10-CM | POA: Diagnosis not present

## 2020-04-30 DIAGNOSIS — L97819 Non-pressure chronic ulcer of other part of right lower leg with unspecified severity: Secondary | ICD-10-CM | POA: Diagnosis not present

## 2020-04-30 DIAGNOSIS — Z951 Presence of aortocoronary bypass graft: Secondary | ICD-10-CM | POA: Diagnosis not present

## 2020-04-30 DIAGNOSIS — E1122 Type 2 diabetes mellitus with diabetic chronic kidney disease: Secondary | ICD-10-CM | POA: Diagnosis not present

## 2020-04-30 DIAGNOSIS — N183 Chronic kidney disease, stage 3 unspecified: Secondary | ICD-10-CM | POA: Diagnosis not present

## 2020-04-30 DIAGNOSIS — Z794 Long term (current) use of insulin: Secondary | ICD-10-CM | POA: Diagnosis not present

## 2020-04-30 DIAGNOSIS — Z4800 Encounter for change or removal of nonsurgical wound dressing: Secondary | ICD-10-CM | POA: Diagnosis not present

## 2020-04-30 DIAGNOSIS — I129 Hypertensive chronic kidney disease with stage 1 through stage 4 chronic kidney disease, or unspecified chronic kidney disease: Secondary | ICD-10-CM | POA: Diagnosis not present

## 2020-04-30 DIAGNOSIS — E039 Hypothyroidism, unspecified: Secondary | ICD-10-CM | POA: Diagnosis not present

## 2020-04-30 DIAGNOSIS — I2510 Atherosclerotic heart disease of native coronary artery without angina pectoris: Secondary | ICD-10-CM | POA: Diagnosis not present

## 2020-05-03 DIAGNOSIS — N183 Chronic kidney disease, stage 3 unspecified: Secondary | ICD-10-CM | POA: Diagnosis not present

## 2020-05-03 DIAGNOSIS — E1122 Type 2 diabetes mellitus with diabetic chronic kidney disease: Secondary | ICD-10-CM | POA: Diagnosis not present

## 2020-05-03 DIAGNOSIS — L97819 Non-pressure chronic ulcer of other part of right lower leg with unspecified severity: Secondary | ICD-10-CM | POA: Diagnosis not present

## 2020-05-03 DIAGNOSIS — I2510 Atherosclerotic heart disease of native coronary artery without angina pectoris: Secondary | ICD-10-CM | POA: Diagnosis not present

## 2020-05-03 DIAGNOSIS — I129 Hypertensive chronic kidney disease with stage 1 through stage 4 chronic kidney disease, or unspecified chronic kidney disease: Secondary | ICD-10-CM | POA: Diagnosis not present

## 2020-05-03 DIAGNOSIS — Z4800 Encounter for change or removal of nonsurgical wound dressing: Secondary | ICD-10-CM | POA: Diagnosis not present

## 2020-05-07 DIAGNOSIS — Z4800 Encounter for change or removal of nonsurgical wound dressing: Secondary | ICD-10-CM | POA: Diagnosis not present

## 2020-05-07 DIAGNOSIS — N183 Chronic kidney disease, stage 3 unspecified: Secondary | ICD-10-CM | POA: Diagnosis not present

## 2020-05-07 DIAGNOSIS — I10 Essential (primary) hypertension: Secondary | ICD-10-CM | POA: Diagnosis not present

## 2020-05-07 DIAGNOSIS — E118 Type 2 diabetes mellitus with unspecified complications: Secondary | ICD-10-CM | POA: Diagnosis not present

## 2020-05-07 DIAGNOSIS — L97819 Non-pressure chronic ulcer of other part of right lower leg with unspecified severity: Secondary | ICD-10-CM | POA: Diagnosis not present

## 2020-05-07 DIAGNOSIS — E782 Mixed hyperlipidemia: Secondary | ICD-10-CM | POA: Diagnosis not present

## 2020-05-07 DIAGNOSIS — I2510 Atherosclerotic heart disease of native coronary artery without angina pectoris: Secondary | ICD-10-CM | POA: Diagnosis not present

## 2020-05-07 DIAGNOSIS — I129 Hypertensive chronic kidney disease with stage 1 through stage 4 chronic kidney disease, or unspecified chronic kidney disease: Secondary | ICD-10-CM | POA: Diagnosis not present

## 2020-05-07 DIAGNOSIS — E039 Hypothyroidism, unspecified: Secondary | ICD-10-CM | POA: Diagnosis not present

## 2020-05-07 DIAGNOSIS — E559 Vitamin D deficiency, unspecified: Secondary | ICD-10-CM | POA: Diagnosis not present

## 2020-05-07 DIAGNOSIS — E1122 Type 2 diabetes mellitus with diabetic chronic kidney disease: Secondary | ICD-10-CM | POA: Diagnosis not present

## 2020-05-10 DIAGNOSIS — Z4800 Encounter for change or removal of nonsurgical wound dressing: Secondary | ICD-10-CM | POA: Diagnosis not present

## 2020-05-10 DIAGNOSIS — L97819 Non-pressure chronic ulcer of other part of right lower leg with unspecified severity: Secondary | ICD-10-CM | POA: Diagnosis not present

## 2020-05-10 DIAGNOSIS — N183 Chronic kidney disease, stage 3 unspecified: Secondary | ICD-10-CM | POA: Diagnosis not present

## 2020-05-10 DIAGNOSIS — I2510 Atherosclerotic heart disease of native coronary artery without angina pectoris: Secondary | ICD-10-CM | POA: Diagnosis not present

## 2020-05-10 DIAGNOSIS — I129 Hypertensive chronic kidney disease with stage 1 through stage 4 chronic kidney disease, or unspecified chronic kidney disease: Secondary | ICD-10-CM | POA: Diagnosis not present

## 2020-05-10 DIAGNOSIS — E1122 Type 2 diabetes mellitus with diabetic chronic kidney disease: Secondary | ICD-10-CM | POA: Diagnosis not present

## 2020-05-14 DIAGNOSIS — I2510 Atherosclerotic heart disease of native coronary artery without angina pectoris: Secondary | ICD-10-CM | POA: Diagnosis not present

## 2020-05-14 DIAGNOSIS — L97819 Non-pressure chronic ulcer of other part of right lower leg with unspecified severity: Secondary | ICD-10-CM | POA: Diagnosis not present

## 2020-05-14 DIAGNOSIS — Z4800 Encounter for change or removal of nonsurgical wound dressing: Secondary | ICD-10-CM | POA: Diagnosis not present

## 2020-05-14 DIAGNOSIS — N183 Chronic kidney disease, stage 3 unspecified: Secondary | ICD-10-CM | POA: Diagnosis not present

## 2020-05-14 DIAGNOSIS — I129 Hypertensive chronic kidney disease with stage 1 through stage 4 chronic kidney disease, or unspecified chronic kidney disease: Secondary | ICD-10-CM | POA: Diagnosis not present

## 2020-05-14 DIAGNOSIS — E1122 Type 2 diabetes mellitus with diabetic chronic kidney disease: Secondary | ICD-10-CM | POA: Diagnosis not present

## 2020-05-17 DIAGNOSIS — I129 Hypertensive chronic kidney disease with stage 1 through stage 4 chronic kidney disease, or unspecified chronic kidney disease: Secondary | ICD-10-CM | POA: Diagnosis not present

## 2020-05-17 DIAGNOSIS — L97819 Non-pressure chronic ulcer of other part of right lower leg with unspecified severity: Secondary | ICD-10-CM | POA: Diagnosis not present

## 2020-05-17 DIAGNOSIS — N183 Chronic kidney disease, stage 3 unspecified: Secondary | ICD-10-CM | POA: Diagnosis not present

## 2020-05-17 DIAGNOSIS — E1122 Type 2 diabetes mellitus with diabetic chronic kidney disease: Secondary | ICD-10-CM | POA: Diagnosis not present

## 2020-05-17 DIAGNOSIS — Z4800 Encounter for change or removal of nonsurgical wound dressing: Secondary | ICD-10-CM | POA: Diagnosis not present

## 2020-05-17 DIAGNOSIS — I2510 Atherosclerotic heart disease of native coronary artery without angina pectoris: Secondary | ICD-10-CM | POA: Diagnosis not present

## 2020-05-21 DIAGNOSIS — N183 Chronic kidney disease, stage 3 unspecified: Secondary | ICD-10-CM | POA: Diagnosis not present

## 2020-05-21 DIAGNOSIS — I2510 Atherosclerotic heart disease of native coronary artery without angina pectoris: Secondary | ICD-10-CM | POA: Diagnosis not present

## 2020-05-21 DIAGNOSIS — Z4800 Encounter for change or removal of nonsurgical wound dressing: Secondary | ICD-10-CM | POA: Diagnosis not present

## 2020-05-21 DIAGNOSIS — I129 Hypertensive chronic kidney disease with stage 1 through stage 4 chronic kidney disease, or unspecified chronic kidney disease: Secondary | ICD-10-CM | POA: Diagnosis not present

## 2020-05-21 DIAGNOSIS — E1122 Type 2 diabetes mellitus with diabetic chronic kidney disease: Secondary | ICD-10-CM | POA: Diagnosis not present

## 2020-05-21 DIAGNOSIS — L97819 Non-pressure chronic ulcer of other part of right lower leg with unspecified severity: Secondary | ICD-10-CM | POA: Diagnosis not present

## 2020-05-23 DIAGNOSIS — Z4800 Encounter for change or removal of nonsurgical wound dressing: Secondary | ICD-10-CM | POA: Diagnosis not present

## 2020-05-23 DIAGNOSIS — I129 Hypertensive chronic kidney disease with stage 1 through stage 4 chronic kidney disease, or unspecified chronic kidney disease: Secondary | ICD-10-CM | POA: Diagnosis not present

## 2020-05-23 DIAGNOSIS — E1122 Type 2 diabetes mellitus with diabetic chronic kidney disease: Secondary | ICD-10-CM | POA: Diagnosis not present

## 2020-05-23 DIAGNOSIS — N183 Chronic kidney disease, stage 3 unspecified: Secondary | ICD-10-CM | POA: Diagnosis not present

## 2020-05-23 DIAGNOSIS — I2510 Atherosclerotic heart disease of native coronary artery without angina pectoris: Secondary | ICD-10-CM | POA: Diagnosis not present

## 2020-05-23 DIAGNOSIS — L97819 Non-pressure chronic ulcer of other part of right lower leg with unspecified severity: Secondary | ICD-10-CM | POA: Diagnosis not present

## 2020-05-28 DIAGNOSIS — L97819 Non-pressure chronic ulcer of other part of right lower leg with unspecified severity: Secondary | ICD-10-CM | POA: Diagnosis not present

## 2020-05-28 DIAGNOSIS — I129 Hypertensive chronic kidney disease with stage 1 through stage 4 chronic kidney disease, or unspecified chronic kidney disease: Secondary | ICD-10-CM | POA: Diagnosis not present

## 2020-05-28 DIAGNOSIS — Z4800 Encounter for change or removal of nonsurgical wound dressing: Secondary | ICD-10-CM | POA: Diagnosis not present

## 2020-05-28 DIAGNOSIS — I2510 Atherosclerotic heart disease of native coronary artery without angina pectoris: Secondary | ICD-10-CM | POA: Diagnosis not present

## 2020-05-28 DIAGNOSIS — E1122 Type 2 diabetes mellitus with diabetic chronic kidney disease: Secondary | ICD-10-CM | POA: Diagnosis not present

## 2020-05-28 DIAGNOSIS — N183 Chronic kidney disease, stage 3 unspecified: Secondary | ICD-10-CM | POA: Diagnosis not present

## 2020-05-30 DIAGNOSIS — E039 Hypothyroidism, unspecified: Secondary | ICD-10-CM | POA: Diagnosis not present

## 2020-05-30 DIAGNOSIS — Z4800 Encounter for change or removal of nonsurgical wound dressing: Secondary | ICD-10-CM | POA: Diagnosis not present

## 2020-05-30 DIAGNOSIS — E1122 Type 2 diabetes mellitus with diabetic chronic kidney disease: Secondary | ICD-10-CM | POA: Diagnosis not present

## 2020-05-30 DIAGNOSIS — I2510 Atherosclerotic heart disease of native coronary artery without angina pectoris: Secondary | ICD-10-CM | POA: Diagnosis not present

## 2020-05-30 DIAGNOSIS — I129 Hypertensive chronic kidney disease with stage 1 through stage 4 chronic kidney disease, or unspecified chronic kidney disease: Secondary | ICD-10-CM | POA: Diagnosis not present

## 2020-05-30 DIAGNOSIS — Z794 Long term (current) use of insulin: Secondary | ICD-10-CM | POA: Diagnosis not present

## 2020-05-30 DIAGNOSIS — L97819 Non-pressure chronic ulcer of other part of right lower leg with unspecified severity: Secondary | ICD-10-CM | POA: Diagnosis not present

## 2020-05-30 DIAGNOSIS — C641 Malignant neoplasm of right kidney, except renal pelvis: Secondary | ICD-10-CM | POA: Diagnosis not present

## 2020-05-30 DIAGNOSIS — Z951 Presence of aortocoronary bypass graft: Secondary | ICD-10-CM | POA: Diagnosis not present

## 2020-05-30 DIAGNOSIS — N183 Chronic kidney disease, stage 3 unspecified: Secondary | ICD-10-CM | POA: Diagnosis not present

## 2020-05-31 DIAGNOSIS — Z4800 Encounter for change or removal of nonsurgical wound dressing: Secondary | ICD-10-CM | POA: Diagnosis not present

## 2020-05-31 DIAGNOSIS — L97819 Non-pressure chronic ulcer of other part of right lower leg with unspecified severity: Secondary | ICD-10-CM | POA: Diagnosis not present

## 2020-05-31 DIAGNOSIS — E1122 Type 2 diabetes mellitus with diabetic chronic kidney disease: Secondary | ICD-10-CM | POA: Diagnosis not present

## 2020-05-31 DIAGNOSIS — N183 Chronic kidney disease, stage 3 unspecified: Secondary | ICD-10-CM | POA: Diagnosis not present

## 2020-05-31 DIAGNOSIS — I2510 Atherosclerotic heart disease of native coronary artery without angina pectoris: Secondary | ICD-10-CM | POA: Diagnosis not present

## 2020-05-31 DIAGNOSIS — I129 Hypertensive chronic kidney disease with stage 1 through stage 4 chronic kidney disease, or unspecified chronic kidney disease: Secondary | ICD-10-CM | POA: Diagnosis not present

## 2020-06-05 DIAGNOSIS — Z4800 Encounter for change or removal of nonsurgical wound dressing: Secondary | ICD-10-CM | POA: Diagnosis not present

## 2020-06-05 DIAGNOSIS — N183 Chronic kidney disease, stage 3 unspecified: Secondary | ICD-10-CM | POA: Diagnosis not present

## 2020-06-05 DIAGNOSIS — L97819 Non-pressure chronic ulcer of other part of right lower leg with unspecified severity: Secondary | ICD-10-CM | POA: Diagnosis not present

## 2020-06-05 DIAGNOSIS — I129 Hypertensive chronic kidney disease with stage 1 through stage 4 chronic kidney disease, or unspecified chronic kidney disease: Secondary | ICD-10-CM | POA: Diagnosis not present

## 2020-06-05 DIAGNOSIS — E1122 Type 2 diabetes mellitus with diabetic chronic kidney disease: Secondary | ICD-10-CM | POA: Diagnosis not present

## 2020-06-05 DIAGNOSIS — I2510 Atherosclerotic heart disease of native coronary artery without angina pectoris: Secondary | ICD-10-CM | POA: Diagnosis not present

## 2020-06-11 DIAGNOSIS — N183 Chronic kidney disease, stage 3 unspecified: Secondary | ICD-10-CM | POA: Diagnosis not present

## 2020-06-11 DIAGNOSIS — L97819 Non-pressure chronic ulcer of other part of right lower leg with unspecified severity: Secondary | ICD-10-CM | POA: Diagnosis not present

## 2020-06-11 DIAGNOSIS — E1122 Type 2 diabetes mellitus with diabetic chronic kidney disease: Secondary | ICD-10-CM | POA: Diagnosis not present

## 2020-06-11 DIAGNOSIS — Z4800 Encounter for change or removal of nonsurgical wound dressing: Secondary | ICD-10-CM | POA: Diagnosis not present

## 2020-06-11 DIAGNOSIS — I129 Hypertensive chronic kidney disease with stage 1 through stage 4 chronic kidney disease, or unspecified chronic kidney disease: Secondary | ICD-10-CM | POA: Diagnosis not present

## 2020-06-11 DIAGNOSIS — I2510 Atherosclerotic heart disease of native coronary artery without angina pectoris: Secondary | ICD-10-CM | POA: Diagnosis not present

## 2020-06-14 DIAGNOSIS — I129 Hypertensive chronic kidney disease with stage 1 through stage 4 chronic kidney disease, or unspecified chronic kidney disease: Secondary | ICD-10-CM | POA: Diagnosis not present

## 2020-06-14 DIAGNOSIS — E1122 Type 2 diabetes mellitus with diabetic chronic kidney disease: Secondary | ICD-10-CM | POA: Diagnosis not present

## 2020-06-14 DIAGNOSIS — N183 Chronic kidney disease, stage 3 unspecified: Secondary | ICD-10-CM | POA: Diagnosis not present

## 2020-06-14 DIAGNOSIS — L97819 Non-pressure chronic ulcer of other part of right lower leg with unspecified severity: Secondary | ICD-10-CM | POA: Diagnosis not present

## 2020-06-14 DIAGNOSIS — Z4800 Encounter for change or removal of nonsurgical wound dressing: Secondary | ICD-10-CM | POA: Diagnosis not present

## 2020-06-14 DIAGNOSIS — I2510 Atherosclerotic heart disease of native coronary artery without angina pectoris: Secondary | ICD-10-CM | POA: Diagnosis not present

## 2020-06-19 DIAGNOSIS — N183 Chronic kidney disease, stage 3 unspecified: Secondary | ICD-10-CM | POA: Diagnosis not present

## 2020-06-19 DIAGNOSIS — E1122 Type 2 diabetes mellitus with diabetic chronic kidney disease: Secondary | ICD-10-CM | POA: Diagnosis not present

## 2020-06-19 DIAGNOSIS — I2510 Atherosclerotic heart disease of native coronary artery without angina pectoris: Secondary | ICD-10-CM | POA: Diagnosis not present

## 2020-06-19 DIAGNOSIS — Z4800 Encounter for change or removal of nonsurgical wound dressing: Secondary | ICD-10-CM | POA: Diagnosis not present

## 2020-06-19 DIAGNOSIS — I129 Hypertensive chronic kidney disease with stage 1 through stage 4 chronic kidney disease, or unspecified chronic kidney disease: Secondary | ICD-10-CM | POA: Diagnosis not present

## 2020-06-19 DIAGNOSIS — L97819 Non-pressure chronic ulcer of other part of right lower leg with unspecified severity: Secondary | ICD-10-CM | POA: Diagnosis not present

## 2020-06-21 DIAGNOSIS — E1122 Type 2 diabetes mellitus with diabetic chronic kidney disease: Secondary | ICD-10-CM | POA: Diagnosis not present

## 2020-06-21 DIAGNOSIS — N183 Chronic kidney disease, stage 3 unspecified: Secondary | ICD-10-CM | POA: Diagnosis not present

## 2020-06-21 DIAGNOSIS — I129 Hypertensive chronic kidney disease with stage 1 through stage 4 chronic kidney disease, or unspecified chronic kidney disease: Secondary | ICD-10-CM | POA: Diagnosis not present

## 2020-06-21 DIAGNOSIS — L97819 Non-pressure chronic ulcer of other part of right lower leg with unspecified severity: Secondary | ICD-10-CM | POA: Diagnosis not present

## 2020-06-21 DIAGNOSIS — I2510 Atherosclerotic heart disease of native coronary artery without angina pectoris: Secondary | ICD-10-CM | POA: Diagnosis not present

## 2020-06-21 DIAGNOSIS — Z4800 Encounter for change or removal of nonsurgical wound dressing: Secondary | ICD-10-CM | POA: Diagnosis not present

## 2020-06-25 DIAGNOSIS — Z4800 Encounter for change or removal of nonsurgical wound dressing: Secondary | ICD-10-CM | POA: Diagnosis not present

## 2020-06-25 DIAGNOSIS — I129 Hypertensive chronic kidney disease with stage 1 through stage 4 chronic kidney disease, or unspecified chronic kidney disease: Secondary | ICD-10-CM | POA: Diagnosis not present

## 2020-06-25 DIAGNOSIS — N183 Chronic kidney disease, stage 3 unspecified: Secondary | ICD-10-CM | POA: Diagnosis not present

## 2020-06-25 DIAGNOSIS — E1122 Type 2 diabetes mellitus with diabetic chronic kidney disease: Secondary | ICD-10-CM | POA: Diagnosis not present

## 2020-06-25 DIAGNOSIS — I2510 Atherosclerotic heart disease of native coronary artery without angina pectoris: Secondary | ICD-10-CM | POA: Diagnosis not present

## 2020-06-25 DIAGNOSIS — L97819 Non-pressure chronic ulcer of other part of right lower leg with unspecified severity: Secondary | ICD-10-CM | POA: Diagnosis not present

## 2020-06-28 DIAGNOSIS — E1122 Type 2 diabetes mellitus with diabetic chronic kidney disease: Secondary | ICD-10-CM | POA: Diagnosis not present

## 2020-06-28 DIAGNOSIS — I129 Hypertensive chronic kidney disease with stage 1 through stage 4 chronic kidney disease, or unspecified chronic kidney disease: Secondary | ICD-10-CM | POA: Diagnosis not present

## 2020-06-28 DIAGNOSIS — Z4800 Encounter for change or removal of nonsurgical wound dressing: Secondary | ICD-10-CM | POA: Diagnosis not present

## 2020-06-28 DIAGNOSIS — N183 Chronic kidney disease, stage 3 unspecified: Secondary | ICD-10-CM | POA: Diagnosis not present

## 2020-06-28 DIAGNOSIS — I2510 Atherosclerotic heart disease of native coronary artery without angina pectoris: Secondary | ICD-10-CM | POA: Diagnosis not present

## 2020-06-28 DIAGNOSIS — L97819 Non-pressure chronic ulcer of other part of right lower leg with unspecified severity: Secondary | ICD-10-CM | POA: Diagnosis not present

## 2020-06-29 DIAGNOSIS — Z4800 Encounter for change or removal of nonsurgical wound dressing: Secondary | ICD-10-CM | POA: Diagnosis not present

## 2020-06-29 DIAGNOSIS — I130 Hypertensive heart and chronic kidney disease with heart failure and stage 1 through stage 4 chronic kidney disease, or unspecified chronic kidney disease: Secondary | ICD-10-CM | POA: Diagnosis not present

## 2020-06-29 DIAGNOSIS — Z9981 Dependence on supplemental oxygen: Secondary | ICD-10-CM | POA: Diagnosis not present

## 2020-06-29 DIAGNOSIS — I272 Pulmonary hypertension, unspecified: Secondary | ICD-10-CM | POA: Diagnosis not present

## 2020-06-29 DIAGNOSIS — Z87891 Personal history of nicotine dependence: Secondary | ICD-10-CM | POA: Diagnosis not present

## 2020-06-29 DIAGNOSIS — L97819 Non-pressure chronic ulcer of other part of right lower leg with unspecified severity: Secondary | ICD-10-CM | POA: Diagnosis not present

## 2020-06-29 DIAGNOSIS — C641 Malignant neoplasm of right kidney, except renal pelvis: Secondary | ICD-10-CM | POA: Diagnosis not present

## 2020-06-29 DIAGNOSIS — Z794 Long term (current) use of insulin: Secondary | ICD-10-CM | POA: Diagnosis not present

## 2020-06-29 DIAGNOSIS — I252 Old myocardial infarction: Secondary | ICD-10-CM | POA: Diagnosis not present

## 2020-06-29 DIAGNOSIS — E039 Hypothyroidism, unspecified: Secondary | ICD-10-CM | POA: Diagnosis not present

## 2020-06-29 DIAGNOSIS — E1122 Type 2 diabetes mellitus with diabetic chronic kidney disease: Secondary | ICD-10-CM | POA: Diagnosis not present

## 2020-06-29 DIAGNOSIS — N183 Chronic kidney disease, stage 3 unspecified: Secondary | ICD-10-CM | POA: Diagnosis not present

## 2020-06-29 DIAGNOSIS — I2510 Atherosclerotic heart disease of native coronary artery without angina pectoris: Secondary | ICD-10-CM | POA: Diagnosis not present

## 2020-06-29 DIAGNOSIS — G4733 Obstructive sleep apnea (adult) (pediatric): Secondary | ICD-10-CM | POA: Diagnosis not present

## 2020-06-29 DIAGNOSIS — Z951 Presence of aortocoronary bypass graft: Secondary | ICD-10-CM | POA: Diagnosis not present

## 2020-06-29 DIAGNOSIS — J439 Emphysema, unspecified: Secondary | ICD-10-CM | POA: Diagnosis not present

## 2020-06-29 DIAGNOSIS — I5032 Chronic diastolic (congestive) heart failure: Secondary | ICD-10-CM | POA: Diagnosis not present

## 2020-06-29 DIAGNOSIS — J9611 Chronic respiratory failure with hypoxia: Secondary | ICD-10-CM | POA: Diagnosis not present

## 2020-07-02 DIAGNOSIS — I2510 Atherosclerotic heart disease of native coronary artery without angina pectoris: Secondary | ICD-10-CM | POA: Diagnosis not present

## 2020-07-02 DIAGNOSIS — J439 Emphysema, unspecified: Secondary | ICD-10-CM | POA: Diagnosis not present

## 2020-07-02 DIAGNOSIS — J9611 Chronic respiratory failure with hypoxia: Secondary | ICD-10-CM | POA: Diagnosis not present

## 2020-07-02 DIAGNOSIS — I130 Hypertensive heart and chronic kidney disease with heart failure and stage 1 through stage 4 chronic kidney disease, or unspecified chronic kidney disease: Secondary | ICD-10-CM | POA: Diagnosis not present

## 2020-07-02 DIAGNOSIS — Z4800 Encounter for change or removal of nonsurgical wound dressing: Secondary | ICD-10-CM | POA: Diagnosis not present

## 2020-07-02 DIAGNOSIS — L97819 Non-pressure chronic ulcer of other part of right lower leg with unspecified severity: Secondary | ICD-10-CM | POA: Diagnosis not present

## 2020-07-05 DIAGNOSIS — J439 Emphysema, unspecified: Secondary | ICD-10-CM | POA: Diagnosis not present

## 2020-07-05 DIAGNOSIS — I130 Hypertensive heart and chronic kidney disease with heart failure and stage 1 through stage 4 chronic kidney disease, or unspecified chronic kidney disease: Secondary | ICD-10-CM | POA: Diagnosis not present

## 2020-07-05 DIAGNOSIS — J9611 Chronic respiratory failure with hypoxia: Secondary | ICD-10-CM | POA: Diagnosis not present

## 2020-07-05 DIAGNOSIS — L97819 Non-pressure chronic ulcer of other part of right lower leg with unspecified severity: Secondary | ICD-10-CM | POA: Diagnosis not present

## 2020-07-05 DIAGNOSIS — I2510 Atherosclerotic heart disease of native coronary artery without angina pectoris: Secondary | ICD-10-CM | POA: Diagnosis not present

## 2020-07-05 DIAGNOSIS — Z4800 Encounter for change or removal of nonsurgical wound dressing: Secondary | ICD-10-CM | POA: Diagnosis not present

## 2020-07-09 DIAGNOSIS — J439 Emphysema, unspecified: Secondary | ICD-10-CM | POA: Diagnosis not present

## 2020-07-09 DIAGNOSIS — J9611 Chronic respiratory failure with hypoxia: Secondary | ICD-10-CM | POA: Diagnosis not present

## 2020-07-09 DIAGNOSIS — L97819 Non-pressure chronic ulcer of other part of right lower leg with unspecified severity: Secondary | ICD-10-CM | POA: Diagnosis not present

## 2020-07-09 DIAGNOSIS — I130 Hypertensive heart and chronic kidney disease with heart failure and stage 1 through stage 4 chronic kidney disease, or unspecified chronic kidney disease: Secondary | ICD-10-CM | POA: Diagnosis not present

## 2020-07-09 DIAGNOSIS — Z4800 Encounter for change or removal of nonsurgical wound dressing: Secondary | ICD-10-CM | POA: Diagnosis not present

## 2020-07-09 DIAGNOSIS — I2510 Atherosclerotic heart disease of native coronary artery without angina pectoris: Secondary | ICD-10-CM | POA: Diagnosis not present

## 2020-07-12 DIAGNOSIS — L97819 Non-pressure chronic ulcer of other part of right lower leg with unspecified severity: Secondary | ICD-10-CM | POA: Diagnosis not present

## 2020-07-12 DIAGNOSIS — J9611 Chronic respiratory failure with hypoxia: Secondary | ICD-10-CM | POA: Diagnosis not present

## 2020-07-12 DIAGNOSIS — I2510 Atherosclerotic heart disease of native coronary artery without angina pectoris: Secondary | ICD-10-CM | POA: Diagnosis not present

## 2020-07-12 DIAGNOSIS — I130 Hypertensive heart and chronic kidney disease with heart failure and stage 1 through stage 4 chronic kidney disease, or unspecified chronic kidney disease: Secondary | ICD-10-CM | POA: Diagnosis not present

## 2020-07-12 DIAGNOSIS — Z4800 Encounter for change or removal of nonsurgical wound dressing: Secondary | ICD-10-CM | POA: Diagnosis not present

## 2020-07-12 DIAGNOSIS — J439 Emphysema, unspecified: Secondary | ICD-10-CM | POA: Diagnosis not present

## 2020-07-16 DIAGNOSIS — Z4800 Encounter for change or removal of nonsurgical wound dressing: Secondary | ICD-10-CM | POA: Diagnosis not present

## 2020-07-16 DIAGNOSIS — I2510 Atherosclerotic heart disease of native coronary artery without angina pectoris: Secondary | ICD-10-CM | POA: Diagnosis not present

## 2020-07-16 DIAGNOSIS — J439 Emphysema, unspecified: Secondary | ICD-10-CM | POA: Diagnosis not present

## 2020-07-16 DIAGNOSIS — J9611 Chronic respiratory failure with hypoxia: Secondary | ICD-10-CM | POA: Diagnosis not present

## 2020-07-16 DIAGNOSIS — I130 Hypertensive heart and chronic kidney disease with heart failure and stage 1 through stage 4 chronic kidney disease, or unspecified chronic kidney disease: Secondary | ICD-10-CM | POA: Diagnosis not present

## 2020-07-16 DIAGNOSIS — L97819 Non-pressure chronic ulcer of other part of right lower leg with unspecified severity: Secondary | ICD-10-CM | POA: Diagnosis not present

## 2020-07-19 ENCOUNTER — Other Ambulatory Visit: Payer: Self-pay

## 2020-07-19 ENCOUNTER — Inpatient Hospital Stay (HOSPITAL_COMMUNITY)
Admission: EM | Admit: 2020-07-19 | Discharge: 2020-07-24 | DRG: 291 | Disposition: A | Discharge status: Home Health | Payer: Medicare Other | Attending: Internal Medicine | Admitting: Internal Medicine

## 2020-07-19 ENCOUNTER — Encounter (HOSPITAL_COMMUNITY): Payer: Self-pay

## 2020-07-19 ENCOUNTER — Emergency Department (HOSPITAL_COMMUNITY): Payer: Medicare Other

## 2020-07-19 ENCOUNTER — Other Ambulatory Visit: Payer: Self-pay | Admitting: Cardiology

## 2020-07-19 DIAGNOSIS — J811 Chronic pulmonary edema: Secondary | ICD-10-CM | POA: Diagnosis not present

## 2020-07-19 DIAGNOSIS — I5031 Acute diastolic (congestive) heart failure: Secondary | ICD-10-CM | POA: Diagnosis not present

## 2020-07-19 DIAGNOSIS — J9611 Chronic respiratory failure with hypoxia: Secondary | ICD-10-CM | POA: Diagnosis not present

## 2020-07-19 DIAGNOSIS — I10 Essential (primary) hypertension: Secondary | ICD-10-CM | POA: Diagnosis present

## 2020-07-19 DIAGNOSIS — E785 Hyperlipidemia, unspecified: Secondary | ICD-10-CM | POA: Diagnosis present

## 2020-07-19 DIAGNOSIS — I130 Hypertensive heart and chronic kidney disease with heart failure and stage 1 through stage 4 chronic kidney disease, or unspecified chronic kidney disease: Principal | ICD-10-CM | POA: Diagnosis present

## 2020-07-19 DIAGNOSIS — I509 Heart failure, unspecified: Secondary | ICD-10-CM | POA: Diagnosis not present

## 2020-07-19 DIAGNOSIS — I2510 Atherosclerotic heart disease of native coronary artery without angina pectoris: Secondary | ICD-10-CM | POA: Diagnosis not present

## 2020-07-19 DIAGNOSIS — Z20822 Contact with and (suspected) exposure to covid-19: Secondary | ICD-10-CM | POA: Diagnosis not present

## 2020-07-19 DIAGNOSIS — J9601 Acute respiratory failure with hypoxia: Secondary | ICD-10-CM | POA: Diagnosis present

## 2020-07-19 DIAGNOSIS — N183 Chronic kidney disease, stage 3 unspecified: Secondary | ICD-10-CM | POA: Diagnosis present

## 2020-07-19 DIAGNOSIS — Z794 Long term (current) use of insulin: Secondary | ICD-10-CM

## 2020-07-19 DIAGNOSIS — R0602 Shortness of breath: Secondary | ICD-10-CM

## 2020-07-19 DIAGNOSIS — L97819 Non-pressure chronic ulcer of other part of right lower leg with unspecified severity: Secondary | ICD-10-CM | POA: Diagnosis not present

## 2020-07-19 DIAGNOSIS — I110 Hypertensive heart disease with heart failure: Secondary | ICD-10-CM | POA: Diagnosis not present

## 2020-07-19 DIAGNOSIS — I272 Pulmonary hypertension, unspecified: Secondary | ICD-10-CM | POA: Diagnosis present

## 2020-07-19 DIAGNOSIS — I5033 Acute on chronic diastolic (congestive) heart failure: Secondary | ICD-10-CM | POA: Diagnosis not present

## 2020-07-19 DIAGNOSIS — E119 Type 2 diabetes mellitus without complications: Secondary | ICD-10-CM

## 2020-07-19 DIAGNOSIS — I517 Cardiomegaly: Secondary | ICD-10-CM | POA: Diagnosis not present

## 2020-07-19 DIAGNOSIS — E039 Hypothyroidism, unspecified: Secondary | ICD-10-CM | POA: Diagnosis present

## 2020-07-19 DIAGNOSIS — Z951 Presence of aortocoronary bypass graft: Secondary | ICD-10-CM

## 2020-07-19 DIAGNOSIS — Z4800 Encounter for change or removal of nonsurgical wound dressing: Secondary | ICD-10-CM | POA: Diagnosis not present

## 2020-07-19 DIAGNOSIS — J439 Emphysema, unspecified: Secondary | ICD-10-CM | POA: Diagnosis present

## 2020-07-19 DIAGNOSIS — Z87891 Personal history of nicotine dependence: Secondary | ICD-10-CM

## 2020-07-19 DIAGNOSIS — I872 Venous insufficiency (chronic) (peripheral): Secondary | ICD-10-CM | POA: Diagnosis present

## 2020-07-19 DIAGNOSIS — N289 Disorder of kidney and ureter, unspecified: Secondary | ICD-10-CM

## 2020-07-19 DIAGNOSIS — N1832 Chronic kidney disease, stage 3b: Secondary | ICD-10-CM | POA: Diagnosis present

## 2020-07-19 DIAGNOSIS — I83018 Varicose veins of right lower extremity with ulcer other part of lower leg: Secondary | ICD-10-CM | POA: Diagnosis present

## 2020-07-19 DIAGNOSIS — G4733 Obstructive sleep apnea (adult) (pediatric): Secondary | ICD-10-CM | POA: Diagnosis present

## 2020-07-19 DIAGNOSIS — I252 Old myocardial infarction: Secondary | ICD-10-CM

## 2020-07-19 DIAGNOSIS — Z833 Family history of diabetes mellitus: Secondary | ICD-10-CM

## 2020-07-19 DIAGNOSIS — L97919 Non-pressure chronic ulcer of unspecified part of right lower leg with unspecified severity: Secondary | ICD-10-CM | POA: Diagnosis present

## 2020-07-19 DIAGNOSIS — J90 Pleural effusion, not elsewhere classified: Secondary | ICD-10-CM | POA: Diagnosis not present

## 2020-07-19 DIAGNOSIS — E1122 Type 2 diabetes mellitus with diabetic chronic kidney disease: Secondary | ICD-10-CM | POA: Diagnosis present

## 2020-07-19 DIAGNOSIS — R944 Abnormal results of kidney function studies: Secondary | ICD-10-CM | POA: Diagnosis not present

## 2020-07-19 LAB — BASIC METABOLIC PANEL
Anion gap: 9 (ref 5–15)
BUN: 18 mg/dL (ref 8–23)
CO2: 27 mmol/L (ref 22–32)
Calcium: 9.3 mg/dL (ref 8.9–10.3)
Chloride: 106 mmol/L (ref 98–111)
Creatinine, Ser: 1.93 mg/dL — ABNORMAL HIGH (ref 0.61–1.24)
GFR calc Af Amer: 36 mL/min — ABNORMAL LOW (ref >60)
GFR calc non Af Amer: 31 mL/min — ABNORMAL LOW (ref >60)
Glucose, Bld: 53 mg/dL — ABNORMAL LOW (ref 70–99)
Potassium: 3.9 mmol/L (ref 3.5–5.1)
Sodium: 142 mmol/L (ref 135–145)

## 2020-07-19 LAB — CBG MONITORING, ED
Glucose-Capillary: 38 mg/dL — CL (ref 70–99)
Glucose-Capillary: 200 mg/dL — ABNORMAL HIGH (ref 70–99)

## 2020-07-19 LAB — CBC
HCT: 47.5 % (ref 39.0–52.0)
Hemoglobin: 14.9 g/dL (ref 13.0–17.0)
MCH: 27.2 pg (ref 26.0–34.0)
MCHC: 31.4 g/dL (ref 30.0–36.0)
MCV: 86.8 fL (ref 80.0–100.0)
Platelets: 200 10*3/uL (ref 150–400)
RBC: 5.47 MIL/uL (ref 4.22–5.81)
RDW: 16.7 % — ABNORMAL HIGH (ref 11.5–15.5)
WBC: 7.6 10*3/uL (ref 4.0–10.5)
nRBC: 0 % (ref 0.0–0.2)

## 2020-07-19 LAB — LACTIC ACID, PLASMA: Lactic Acid, Venous: 1.1 mmol/L (ref 0.5–1.9)

## 2020-07-19 NOTE — ED Triage Notes (Signed)
Pt arrives POV for eval of SOB, worse w/ exertion. Wears 2LPM at home all the time. Pt denies CP. States he "never has problems w/ SOB". Pt also evaluated by wound care for evaluation of sores on R leg at home.

## 2020-07-19 NOTE — ED Notes (Signed)
[name] (Daughter#[phone]) called/would like a call back on his status.  Thank you

## 2020-07-20 ENCOUNTER — Inpatient Hospital Stay (HOSPITAL_COMMUNITY): Payer: Medicare Other

## 2020-07-20 ENCOUNTER — Other Ambulatory Visit: Payer: Self-pay

## 2020-07-20 DIAGNOSIS — I5033 Acute on chronic diastolic (congestive) heart failure: Secondary | ICD-10-CM | POA: Diagnosis present

## 2020-07-20 DIAGNOSIS — J439 Emphysema, unspecified: Secondary | ICD-10-CM | POA: Diagnosis present

## 2020-07-20 DIAGNOSIS — Z955 Presence of coronary angioplasty implant and graft: Secondary | ICD-10-CM | POA: Diagnosis not present

## 2020-07-20 DIAGNOSIS — I110 Hypertensive heart disease with heart failure: Secondary | ICD-10-CM | POA: Diagnosis not present

## 2020-07-20 DIAGNOSIS — I509 Heart failure, unspecified: Secondary | ICD-10-CM | POA: Diagnosis not present

## 2020-07-20 DIAGNOSIS — Z951 Presence of aortocoronary bypass graft: Secondary | ICD-10-CM | POA: Diagnosis not present

## 2020-07-20 DIAGNOSIS — E039 Hypothyroidism, unspecified: Secondary | ICD-10-CM | POA: Diagnosis not present

## 2020-07-20 DIAGNOSIS — I5031 Acute diastolic (congestive) heart failure: Secondary | ICD-10-CM

## 2020-07-20 DIAGNOSIS — I252 Old myocardial infarction: Secondary | ICD-10-CM | POA: Diagnosis not present

## 2020-07-20 DIAGNOSIS — R0602 Shortness of breath: Secondary | ICD-10-CM | POA: Diagnosis not present

## 2020-07-20 DIAGNOSIS — I130 Hypertensive heart and chronic kidney disease with heart failure and stage 1 through stage 4 chronic kidney disease, or unspecified chronic kidney disease: Secondary | ICD-10-CM | POA: Diagnosis present

## 2020-07-20 DIAGNOSIS — G4733 Obstructive sleep apnea (adult) (pediatric): Secondary | ICD-10-CM | POA: Diagnosis not present

## 2020-07-20 DIAGNOSIS — N1832 Chronic kidney disease, stage 3b: Secondary | ICD-10-CM | POA: Diagnosis present

## 2020-07-20 DIAGNOSIS — I272 Pulmonary hypertension, unspecified: Secondary | ICD-10-CM | POA: Diagnosis not present

## 2020-07-20 DIAGNOSIS — I872 Venous insufficiency (chronic) (peripheral): Secondary | ICD-10-CM | POA: Diagnosis present

## 2020-07-20 DIAGNOSIS — I83018 Varicose veins of right lower extremity with ulcer other part of lower leg: Secondary | ICD-10-CM | POA: Diagnosis present

## 2020-07-20 DIAGNOSIS — J9601 Acute respiratory failure with hypoxia: Secondary | ICD-10-CM | POA: Diagnosis present

## 2020-07-20 DIAGNOSIS — E1122 Type 2 diabetes mellitus with diabetic chronic kidney disease: Secondary | ICD-10-CM | POA: Diagnosis present

## 2020-07-20 DIAGNOSIS — R944 Abnormal results of kidney function studies: Secondary | ICD-10-CM | POA: Diagnosis not present

## 2020-07-20 DIAGNOSIS — Z20822 Contact with and (suspected) exposure to covid-19: Secondary | ICD-10-CM | POA: Diagnosis present

## 2020-07-20 DIAGNOSIS — Z833 Family history of diabetes mellitus: Secondary | ICD-10-CM | POA: Diagnosis not present

## 2020-07-20 DIAGNOSIS — I2510 Atherosclerotic heart disease of native coronary artery without angina pectoris: Secondary | ICD-10-CM | POA: Diagnosis present

## 2020-07-20 DIAGNOSIS — J449 Chronic obstructive pulmonary disease, unspecified: Secondary | ICD-10-CM | POA: Diagnosis not present

## 2020-07-20 DIAGNOSIS — Z87891 Personal history of nicotine dependence: Secondary | ICD-10-CM | POA: Diagnosis not present

## 2020-07-20 DIAGNOSIS — E785 Hyperlipidemia, unspecified: Secondary | ICD-10-CM | POA: Diagnosis present

## 2020-07-20 DIAGNOSIS — L97919 Non-pressure chronic ulcer of unspecified part of right lower leg with unspecified severity: Secondary | ICD-10-CM | POA: Diagnosis present

## 2020-07-20 DIAGNOSIS — Z794 Long term (current) use of insulin: Secondary | ICD-10-CM | POA: Diagnosis not present

## 2020-07-20 LAB — TROPONIN I (HIGH SENSITIVITY)
Troponin I (High Sensitivity): 58 ng/L — ABNORMAL HIGH (ref <18)
Troponin I (High Sensitivity): 63 ng/L — ABNORMAL HIGH (ref <18)

## 2020-07-20 LAB — CBG MONITORING, ED
Glucose-Capillary: 108 mg/dL — ABNORMAL HIGH (ref 70–99)
Glucose-Capillary: 166 mg/dL — ABNORMAL HIGH (ref 70–99)

## 2020-07-20 LAB — GLUCOSE, CAPILLARY
Glucose-Capillary: 207 mg/dL — ABNORMAL HIGH (ref 70–99)
Glucose-Capillary: 208 mg/dL — ABNORMAL HIGH (ref 70–99)

## 2020-07-20 LAB — ECHOCARDIOGRAM COMPLETE
Area-P 1/2: 2.16 cm2
Height: 69 in
S' Lateral: 2.6 cm
Weight: 3132.3 oz

## 2020-07-20 LAB — SARS CORONAVIRUS 2 BY RT PCR (HOSPITAL ORDER, PERFORMED IN ~~LOC~~ HOSPITAL LAB): SARS Coronavirus 2: NEGATIVE

## 2020-07-20 LAB — HEMOGLOBIN A1C
Hgb A1c MFr Bld: 6.4 % — ABNORMAL HIGH (ref 4.8–5.6)
Mean Plasma Glucose: 136.98 mg/dL

## 2020-07-20 LAB — BRAIN NATRIURETIC PEPTIDE: B Natriuretic Peptide: 2618 pg/mL — ABNORMAL HIGH (ref 0.0–100.0)

## 2020-07-20 MED ORDER — BENAZEPRIL HCL 40 MG PO TABS
40.0000 mg | ORAL_TABLET | Freq: Every day | ORAL | Status: DC
  Filled 2020-07-20: qty 1

## 2020-07-20 MED ORDER — ENOXAPARIN SODIUM 40 MG/0.4ML ~~LOC~~ SOLN
40.0000 mg | SUBCUTANEOUS | Status: DC
  Administered 2020-07-20 – 2020-07-23 (×4): 40 mg via SUBCUTANEOUS
  Filled 2020-07-20 (×5): qty 0.4

## 2020-07-20 MED ORDER — POTASSIUM CHLORIDE CRYS ER 20 MEQ PO TBCR
20.0000 meq | EXTENDED_RELEASE_TABLET | Freq: Two times a day (BID) | ORAL | Status: DC
  Administered 2020-07-20 – 2020-07-24 (×9): 20 meq via ORAL
  Filled 2020-07-20 (×8): qty 1

## 2020-07-20 MED ORDER — ROSUVASTATIN CALCIUM 20 MG PO TABS
20.0000 mg | ORAL_TABLET | Freq: Every day | ORAL | Status: DC
  Administered 2020-07-20 – 2020-07-24 (×5): 20 mg via ORAL
  Filled 2020-07-20 (×5): qty 1

## 2020-07-20 MED ORDER — CLONIDINE HCL 0.1 MG PO TABS
0.1000 mg | ORAL_TABLET | Freq: Two times a day (BID) | ORAL | Status: DC
  Administered 2020-07-20 – 2020-07-24 (×9): 0.1 mg via ORAL
  Filled 2020-07-20 (×8): qty 1

## 2020-07-20 MED ORDER — FUROSEMIDE 10 MG/ML IJ SOLN
40.0000 mg | Freq: Once | INTRAMUSCULAR | Status: AC
  Administered 2020-07-20: 40 mg via INTRAVENOUS
  Filled 2020-07-20: qty 4

## 2020-07-20 MED ORDER — NITROGLYCERIN 2 % TD OINT
1.0000 [in_us] | TOPICAL_OINTMENT | Freq: Once | TRANSDERMAL | Status: AC
  Administered 2020-07-20: 1 [in_us] via TOPICAL
  Filled 2020-07-20: qty 1

## 2020-07-20 MED ORDER — ACETAMINOPHEN 325 MG PO TABS: 650.0000 mg | ORAL_TABLET | Freq: Four times a day (QID) | ORAL | Status: DC | PRN

## 2020-07-20 MED ORDER — SODIUM CHLORIDE 0.9% FLUSH
3.0000 mL | Freq: Two times a day (BID) | INTRAVENOUS | Status: DC
  Administered 2020-07-20 – 2020-07-24 (×9): 3 mL via INTRAVENOUS

## 2020-07-20 MED ORDER — SODIUM CHLORIDE 0.9% FLUSH: 3.0000 mL | INTRAVENOUS | Status: DC | PRN

## 2020-07-20 MED ORDER — AMLODIPINE BESYLATE 5 MG PO TABS
5.0000 mg | ORAL_TABLET | Freq: Every day | ORAL | Status: DC
  Administered 2020-07-21 – 2020-07-22 (×2): 5 mg via ORAL
  Filled 2020-07-20 (×2): qty 1

## 2020-07-20 MED ORDER — NADOLOL 40 MG PO TABS
40.0000 mg | ORAL_TABLET | Freq: Every day | ORAL | Status: DC
  Administered 2020-07-20 – 2020-07-22 (×3): 40 mg via ORAL
  Filled 2020-07-20 (×3): qty 1

## 2020-07-20 MED ORDER — ASPIRIN EC 81 MG PO TBEC
81.0000 mg | DELAYED_RELEASE_TABLET | Freq: Every day | ORAL | Status: DC
  Administered 2020-07-20 – 2020-07-24 (×5): 81 mg via ORAL
  Filled 2020-07-20 (×5): qty 1

## 2020-07-20 MED ORDER — ICOSAPENT ETHYL 1 G PO CAPS
2.0000 g | ORAL_CAPSULE | Freq: Two times a day (BID) | ORAL | Status: DC
  Administered 2020-07-20 – 2020-07-24 (×9): 2 g via ORAL
  Filled 2020-07-20 (×11): qty 2

## 2020-07-20 MED ORDER — AMLODIPINE BESYLATE 5 MG PO TABS
5.0000 mg | ORAL_TABLET | Freq: Once | ORAL | Status: AC
  Administered 2020-07-20: 5 mg via ORAL
  Filled 2020-07-20: qty 1

## 2020-07-20 MED ORDER — EZETIMIBE 10 MG PO TABS
10.0000 mg | ORAL_TABLET | Freq: Every day | ORAL | Status: DC
  Administered 2020-07-20 – 2020-07-24 (×5): 10 mg via ORAL
  Filled 2020-07-20 (×6): qty 1

## 2020-07-20 MED ORDER — SODIUM CHLORIDE 0.9 % IV SOLN: 250.0000 mL | INTRAVENOUS | Status: DC | PRN

## 2020-07-20 MED ORDER — AMLODIPINE BESYLATE 5 MG PO TABS: 5.0000 mg | ORAL_TABLET | Freq: Every day | ORAL | Status: DC

## 2020-07-20 MED ORDER — INSULIN GLARGINE 100 UNIT/ML ~~LOC~~ SOLN
15.0000 [IU] | Freq: Every day | SUBCUTANEOUS | Status: DC
  Administered 2020-07-20 – 2020-07-23 (×4): 15 [IU] via SUBCUTANEOUS
  Filled 2020-07-20 (×6): qty 0.15

## 2020-07-20 MED ORDER — LEVOTHYROXINE SODIUM 100 MCG PO TABS
100.0000 ug | ORAL_TABLET | Freq: Every day | ORAL | Status: DC
  Administered 2020-07-20 – 2020-07-24 (×5): 100 ug via ORAL
  Filled 2020-07-20 (×5): qty 1

## 2020-07-20 MED ORDER — FUROSEMIDE 10 MG/ML IJ SOLN
80.0000 mg | Freq: Two times a day (BID) | INTRAMUSCULAR | Status: DC
  Administered 2020-07-20 – 2020-07-24 (×8): 80 mg via INTRAVENOUS
  Filled 2020-07-20 (×8): qty 8

## 2020-07-20 MED ORDER — INSULIN ASPART 100 UNIT/ML ~~LOC~~ SOLN
0.0000 [IU] | Freq: Three times a day (TID) | SUBCUTANEOUS | Status: DC
  Administered 2020-07-20: 5 [IU] via SUBCUTANEOUS
  Administered 2020-07-21 – 2020-07-22 (×2): 2 [IU] via SUBCUTANEOUS
  Administered 2020-07-22: 3 [IU] via SUBCUTANEOUS
  Administered 2020-07-23: 2 [IU] via SUBCUTANEOUS
  Administered 2020-07-23: 5 [IU] via SUBCUTANEOUS

## 2020-07-20 NOTE — Progress Notes (Signed)
Residents met with pt at the bedside and discussed plan of care and current progress made.

## 2020-07-20 NOTE — H&P (Addendum)
Date: 07/20/2020               Patient Name:  [name] MRN: [id number]  DOB: [birth year] Age / Sex: 86 y.o., male   PCP: [name], MD              Medical Service: Internal Medicine Teaching Service              Attending Physician: Dr. Jimmye Norman, Elaina Pattee, MD    First Contact: Nanda Quinton, [place] 3 Pager: [phone]  Second Contact: Dr. Coy Saunas Pager: [phone]  Third Contact Dr. Koleen Distance Pager: [phone]       After Hours (After 5p/  First Contact Pager: [phone]  weekends / holidays): Second Contact Pager: [phone]   Chief Complaint: Shortness of Breath  History of Present Illness: [name] is a70 year old man with a PMHx significant for HTN, HLD, COPD, OSA, CKD 3b, CAD s/p CABG, and HFpEF who presents today after 1 week of worsening shortnes of breath.  Mr. Rho states that he normally uses oxygen occasionally during the day at 2 LPM as needed and 3 LPM with his CPAP machine. He reports that over the last week he has noticed that he has required oxygen more consistently during the day even at rest. He reports that his exertional capacity is also significantly reduced. Prior to this week he would walk up to 30-45 minutes around his house but currently can walk only a few feet before experiencing significant shortness of breath and fatigue. He also endorses shortness of breath and discomfort when lying flat on his back. He frequently experiences episodes of waking up at night with shortness of breath and coughing.   He has a home health nurse that checks on him at home and noted that he has been gaining weigh recently - up to 15 pounds. Mr [name] notes at least a 7 pound weight gain and leg swelling over the last 2 months.  Denies: n/v/d, changes in urination, cough, chest pain, palpitations, myalgias, arthralgias, rashes, easy bruising, recent illnesses, or sick contacts  Meds: Current Facility-Administered Medications  Medication Dose Route Frequency Provider Last Rate Last  Admin  . 0.9 %  sodium chloride infusion  250 mL Intravenous PRN Bloomfield, Carley D, DO      . acetaminophen (TYLENOL) tablet 650 mg  650 mg Oral Q6H PRN Bloomfield, Carley D, DO      . [START ON 07/21/2020] amLODipine (NORVASC) tablet 5 mg  5 mg Oral Daily Bloomfield, Carley D, DO      . aspirin EC tablet 81 mg  81 mg Oral Daily Bloomfield, Carley D, DO   81 mg at 07/20/20 1105  . cloNIDine (CATAPRES) tablet 0.1 mg  0.1 mg Oral BID Bloomfield, Carley D, DO      . enoxaparin (LOVENOX) injection 40 mg  40 mg Subcutaneous Q24H Bloomfield, Carley D, DO   40 mg at 07/20/20 1104  . ezetimibe (ZETIA) tablet 10 mg  10 mg Oral Daily Bloomfield, Carley D, DO   10 mg at 07/20/20 1106  . furosemide (LASIX) injection 80 mg  80 mg Intravenous BID Bloomfield, Carley D, DO      . icosapent Ethyl (VASCEPA) 1 g capsule 2 g  2 g Oral BID Bloomfield, Carley D, DO   2 g at 07/20/20 1106  . insulin aspart (novoLOG) injection 0-15 Units  0-15 Units Subcutaneous TID WC Bloomfield, Carley D, DO      . insulin glargine (LANTUS) injection  15 Units  15 Units Subcutaneous QHS Bloomfield, Carley D, DO      . levothyroxine (SYNTHROID) tablet 100 mcg  100 mcg Oral Daily Bloomfield, Carley D, DO   100 mcg at 07/20/20 1105  . nadolol (CORGARD) tablet 40 mg  40 mg Oral Daily Bloomfield, Carley D, DO   40 mg at 07/20/20 1106  . potassium chloride SA (KLOR-CON) CR tablet 20 mEq  20 mEq Oral BID Bloomfield, Carley D, DO      . rosuvastatin (CRESTOR) tablet 20 mg  20 mg Oral Daily Bloomfield, Carley D, DO   20 mg at 07/20/20 1105  . sodium chloride flush (NS) 0.9 % injection 3 mL  3 mL Intravenous Q12H Bloomfield, Carley D, DO   3 mL at 07/20/20 1104  . sodium chloride flush (NS) 0.9 % injection 3 mL  3 mL Intravenous PRN Bloomfield, Carley D, DO       Current Outpatient Medications  Medication Sig Dispense Refill  . acetaminophen (TYLENOL) 325 MG tablet Take 650 mg by mouth every 6 (six) hours as needed (for pain.).    Marland Kitchen  amLODipine (NORVASC) 5 MG tablet Take 5 mg by mouth daily.   0  . aspirin 81 MG EC tablet Take 81 mg by mouth daily. Swallow whole.    . benazepril (LOTENSIN) 40 MG tablet Take 40 mg by mouth daily.  0  . cloNIDine (CATAPRES) 0.1 MG tablet Take 0.1 mg by mouth 2 (two) times daily.    Marland Kitchen ezetimibe (ZETIA) 10 MG tablet Take 10 mg by mouth daily.    . furosemide (LASIX) 20 MG tablet TAKE 3 TABLETS(60 MG) BY MOUTH TWICE DAILY (Patient taking differently: Take 80 mg by mouth 2 (two) times daily. ) 180 tablet 0  . HUMALOG KWIKPEN 100 UNIT/ML KiwkPen Inject 6-8 Units into the skin 3 (three) times daily.   0  . Icosapent Ethyl (VASCEPA) 1 g CAPS Take 2 capsules (2 g total) by mouth 2 (two) times daily. 120 capsule 11  . LANTUS SOLOSTAR 100 UNIT/ML Solostar Pen Inject 18 Units into the skin every evening.     . nadolol (CORGARD) 40 MG tablet Take 40 mg by mouth daily.    . potassium chloride (KLOR-CON) 10 MEQ tablet Take 1 tablet (10 mEq total) by mouth 2 (two) times daily. (Patient taking differently: Take 20 mEq by mouth 2 (two) times daily. ) 360 tablet 0  . rosuvastatin (CRESTOR) 20 MG tablet TAKE 1 TABLET BY MOUTH EVERY DAY (Patient taking differently: Take 20 mg by mouth daily. ) 90 tablet 1  . SYNTHROID 100 MCG tablet Take 100 mcg by mouth daily.   0    Allergies: Allergies as of 07/19/2020  . (No Known Allergies)   Past Medical History:  Diagnosis Date  . Chronic kidney disease    tumor right kidney  . Coronary artery disease   . Diabetes mellitus    type 2  . Hypertension   . Myocardial infarction ([place])    1997  . Right renal mass    Past Surgical History:  Procedure Laterality Date  . CARDIAC CATHETERIZATION  1996  . CORONARY ARTERY BYPASS GRAFT    . DOPPLER ECHOCARDIOGRAPHY  2011  . HERNIA REPAIR    . IR GENERIC HISTORICAL  12/09/2016   IR RADIOLOGIST EVAL & MGMT 12/09/2016 [name], MD [facility] INTERV RAD  . ROBOTIC ASSITED PARTIAL NEPHRECTOMY Right 02/19/2017   Procedure: XI  ROBOTIC ASSITED PARTIAL NEPHRECTOMY;  Surgeon: Marland Kitchen  [name], MD;  Location: [facility];  Service: Urology;  Laterality: Right;   Family History  Problem Relation Age of Onset  . Diabetes Mother   . Diabetes Sister    Social History   Socioeconomic History  . Marital status: Married    Spouse name: Not on file  . Number of children: Not on file  . Years of education: Not on file  . Highest education level: Not on file  Occupational History  . Not on file  Tobacco Use  . Smoking status: Former Smoker    Packs/day: 0.30    Years: 45.00    Pack years: 13.50    Types: Cigarettes    Quit date: 02/15/1997    Years since quitting: 23.4  . Smokeless tobacco: Never Used  Vaping Use  . Vaping Use: Never used  Substance and Sexual Activity  . Alcohol use: No  . Drug use: No  . Sexual activity: Yes  Other Topics Concern  . Not on file  Social History Narrative  . Not on file   Social Determinants of Health   Financial Resource Strain:   . Difficulty of Paying Living Expenses:   Food Insecurity:   . Worried About Charity fundraiser in the Last Year:   . Arboriculturist in the Last Year:   Transportation Needs:   . Film/video editor (Medical):   Marland Kitchen Lack of Transportation (Non-Medical):   Physical Activity:   . Days of Exercise per Week:   . Minutes of Exercise per Session:   Stress:   . Feeling of Stress :   Social Connections:   . Frequency of Communication with Friends and Family:   . Frequency of Social Gatherings with Friends and Family:   . Attends Religious Services:   . Active Member of Clubs or Organizations:   . Attends Archivist Meetings:   Marland Kitchen Marital Status:   Intimate Partner Violence:   . Fear of Current or Ex-Partner:   . Emotionally Abused:   Marland Kitchen Physically Abused:   . Sexually Abused:     Review of Systems: ROS negative unless otherwise specified in HPI.   Physical Exam: Blood pressure (!) 171/79, pulse 64, temperature 98 F (36.7  C), temperature source Oral, resp. rate (!) 22, height 5\' 9"  (1.753 m), weight 88.8 kg, SpO2 99 %.  Physical Exam Vitals and nursing note reviewed.  Constitutional: no acute distress, lying comfortably in bed Head: atraumatic ENT: external ears normal Neck: +JVD to jaw lineat 90 degrees Eyes: EOMI, PERRL Cardiovascular: regular rate and rhythm, normal heart sounds Pulmonary: tachypneic, effort normal, bibasilar inspiratory rales with decreased breath sounds bilaterally, scattered inspiratory wheezes mid-upper lung fields bilaterally Abdominal: flat, nontender, no rebound tenderness, bowel sounds normal Musculoskeletal: 2+ pitting edema bilateral lower extremities Skin: warm and dry, left lower anterior leg with erythema, scaling, and wet appearing consistent with venous insufficiency, left lower extremity with early vesicle and bullae formation Neurological: alert, no focal deficit Psychiatric: normal mood and affect  Lab results: CBC    Component Value Date/Time   WBC 7.6 07/19/2020 1656   RBC 5.47 07/19/2020 1656   HGB 14.9 07/19/2020 1656   HCT 47.5 07/19/2020 1656   PLT 200 07/19/2020 1656   MCV 86.8 07/19/2020 1656   MCH 27.2 07/19/2020 1656   MCHC 31.4 07/19/2020 1656   RDW 16.7 (H) 07/19/2020 1656   LYMPHSABS 1.0 05/16/2019 0936   MONOABS 0.6 05/16/2019 0936   EOSABS 0.1  05/16/2019 0936   BASOSABS 0.1 05/16/2019 0936   CMP     Component Value Date/Time   NA 142 07/19/2020 1656   NA 142 06/02/2019 1644   K 3.9 07/19/2020 1656   CL 106 07/19/2020 1656   CO2 27 07/19/2020 1656   GLUCOSE 53 (L) 07/19/2020 1656   BUN 18 07/19/2020 1656   BUN 18 06/02/2019 1644   CREATININE 1.93 (H) 07/19/2020 1656   CREATININE 1.54 (H) 03/31/2017 1028   CALCIUM 9.3 07/19/2020 1656   PROT 7.6 11/23/2017 1059   ALBUMIN 4.0 11/23/2017 1059   AST 26 11/23/2017 1059   ALT 15 (L) 11/23/2017 1059   ALKPHOS 85 11/23/2017 1059   BILITOT 1.1 11/23/2017 1059   GFRNONAA 31 (L) 07/19/2020  1656   GFRAA 36 (L) 07/19/2020 1656   BNP: 2619 HS Trop: 58>63 Lactic Acid: 1.1  Imaging results:  DG Chest 2 View 1. Findings suspicious for CHF. Increase interstitial thickening likely pulmonary edema superimposed on emphysema.  2. Chronic cardiomegaly. Small right and moderate left pleural effusion, similar in degree to prior exam.  Electronically Signed   By: Keith Rake M.D.   On: 07/19/2020 23:04    Other results: EKG: sinus bradycardia, RBBB, relatively unchanged from EKG on 04/21/20  Assessment & Plan by Problem: Active Problems:   Acute heart failure with preserved ejection fraction (HFpEF) (HCC)  Acute HFpEF BNP 2618, EF 60-65%. Troponin minimally elevated to 63 likely due to demand in the setting of acute heart failure exacerbation. Pt with worsened shortness of breath, lower extremity edema, JVD to jaw line. Presented after 2 months of worsening lower extremity swelling and weight gain. Pt having significant improvement in his oxygenation and shortness of breath after diuresis this morning with 80 mg Lasix. Will watch potassium with aggressive diuresis, currently 3.9. --IV lasix 80 mg BID --Strict I&O --F/u CMP AM --Daily weight  Type 2 DM A1c 6.4, insulin dependent.  --Daily CBGs --SSI, moderate, modify as needed --Lantus 15 units QHS  Hypothyroidism --Follow up TSH levels --Continue synthroid 100 mcg  Hypertension Patient presented with BP of 163/105. Holding his ACE/ARB in the setting acute kidney injury.  --Cont home clonidine, amlodipine, and nadolol --Hold ACE/ARB --Daily vitals  Hyperlipidemia --Continue rosuvastatin 20 mg QD --Follow up lipid panel  Venous Insufficiency Chronic erythematous, wet appearing wound with vesicles and scaling on left lower leg. Began after lower leg vein was harvested for CABG procedure.  --Consider pressure stockings as needed --Consider wound care referral  This is a Careers information officer Note.  The care of the  patient was discussed with Dr. Coy Saunas and Koleen Distance and the assessment and plan was formulated with their assistance.  Signed: Calvert Cantor, Medical Student 07/20/2020, 11:08 AM    I have seen and examined the patient myself, and I have reviewed the note by Calvert Cantor, MS3 and was present during the interview and physical exam.   Signed: Lacinda Axon, MD 07/20/2020, 10:05 PM

## 2020-07-20 NOTE — ED Notes (Signed)
Echo in progress.

## 2020-07-20 NOTE — Evaluation (Signed)
Physical Therapy Evaluation Patient Details Name: [name] MRN: [id number] DOB: 1932/10/27 Today's Date: 07/20/2020   History of Present Illness  Pt is an 86 y.o. male admitted 07/19/20 with SOB, worse with exertion, and RLE sores. PMH includes HTN, CAD, CKD, DM, wears 2L O2 baseline.    Clinical Impression  Pt presents with an overall decrease in functional mobility secondary to above. PTA, pt mod indep with walking stick, lives with wife and has weekly aide assist for RLE wound. Today, pt ambulatory throughout room and performing ADL tasks with intermittent min guard for balance; DOE with activity; SpO2 >/93% on 2L O2 [place]. Pt limited by generalized weakness and decreased activity tolerance. Pt would benefit from continued acute PT services to maximize functional mobility and independence prior to d/c home.     Follow Up Recommendations No PT follow up;Supervision - Intermittent    Equipment Recommendations  None recommended by PT    Recommendations for Other Services       Precautions / Restrictions Precautions Precautions: Fall;Other (comment) Precaution Comments: DOE Restrictions Weight Bearing Restrictions: No      Mobility  Bed Mobility Overal bed mobility: Modified Independent             General bed mobility comments: HOB slightly elevated, reliant on momentum to power into sitting  Transfers Overall transfer level: Needs assistance Equipment used: None Transfers: Sit to/from Stand Sit to Stand: Min guard         General transfer comment: Reliant on momentum and UE support to push into standing  Ambulation/Gait Ambulation/Gait assistance: Min Gaffer (Feet): 60 Feet Assistive device: None Gait Pattern/deviations: Step-through pattern;Decreased stride length;Shuffle;Trunk flexed Gait velocity: Decreased   General Gait Details: Slow gait around room without DME, initial min guard progressing to supervision for lines. SpO2 >/93%  on 2L O2 [place]. Prolonged static standing at sink for ADL tasks resulting in SOB  Stairs            Wheelchair Mobility    Modified Rankin (Stroke Patients Only)       Balance Overall balance assessment: Needs assistance   Sitting balance-Leahy Scale: Good       Standing balance-Leahy Scale: Fair Standing balance comment: Can static stand at sink without UE support for ADL tasks (brushing gums/washing dentures, washing face)                             Pertinent Vitals/Pain Pain Assessment: No/denies pain    Home Living Family/patient expects to be discharged to:: Private residence Living Arrangements: Spouse/significant other Available Help at Discharge: Family;Available 24 hours/day Type of Home: House Home Access: Stairs to enter   [employer] of Steps: 2-3 Home Layout: One level Home Equipment: Walker - 2 wheels;Other (comment);Shower seat (walking stick) Additional Comments: Wife uses RW due to arthritis. Has aide come weekly [facility]?) to check RLE wounds    Prior Function Level of Independence: Independent with assistive device(s)         Comments: Has recently been using 'walking stick' inside and outside home. Reports independent with ADLs, sits for showers     Hand Dominance        Extremity/Trunk Assessment   Upper Extremity Assessment Upper Extremity Assessment: Generalized weakness    Lower Extremity Assessment Lower Extremity Assessment: Generalized weakness;RLE deficits/detail RLE Deficits / Details: R lower leg wounds, pt reports chronic; endorses BLE edema much improved since initial admission  Communication   Communication: HOH  Cognition Arousal/Alertness: Awake/alert Behavior During Therapy: WFL for tasks assessed/performed Overall Cognitive Status: Within Functional Limits for tasks assessed                                 General Comments: WFL for simple tasks; HOH      General  Comments General comments (skin integrity, edema, etc.): Initially on 4L O2 [place] in bed, pulse ox not connected; ultimately reading >/93% on 2L O2 [place] via forehead probe    Exercises     Assessment/Plan    PT Assessment Patient needs continued PT services  PT Problem List Decreased strength;Decreased activity tolerance;Decreased balance;Decreased mobility;Cardiopulmonary status limiting activity       PT Treatment Interventions DME instruction;Gait training;Stair training;Functional mobility training;Therapeutic activities;Therapeutic exercise;Balance training;Patient/family education    PT Goals (Current goals can be found in the Care Plan section)  Acute Rehab PT Goals Patient Stated Goal: Decreased SOB when I move PT Goal Formulation: With patient Time For Goal Achievement: 08/03/20 Potential to Achieve Goals: Good    Frequency Min 3X/week   Barriers to discharge        Co-evaluation               AM-PAC PT "6 Clicks" Mobility  Outcome Measure Help needed turning from your back to your side while in a flat bed without using bedrails?: None Help needed moving from lying on your back to sitting on the side of a flat bed without using bedrails?: None Help needed moving to and from a bed to a chair (including a wheelchair)?: A Little Help needed standing up from a chair using your arms (e.g., wheelchair or bedside chair)?: A Little Help needed to walk in hospital room?: A Little Help needed climbing 3-5 steps with a railing? : A Little 6 Click Score: 20    End of Session Equipment Utilized During Treatment: Oxygen Activity Tolerance: Patient tolerated treatment well Patient left: in chair;with call bell/phone within reach;with chair alarm set Nurse Communication: Mobility status PT Visit Diagnosis: Other abnormalities of gait and mobility (R26.89);Muscle weakness (generalized) (M62.81)    Time: 1779-3903 PT Time Calculation (min) (ACUTE ONLY): 28 min   Charges:    PT Evaluation $PT Eval Moderate Complexity: 1 Mod PT Treatments $Therapeutic Activity: 8-22 mins      Mabeline Caras, PT, DPT Acute Rehabilitation Services  Pager [phone] Office [place] 07/20/2020, 5:31 PM

## 2020-07-20 NOTE — ED Notes (Signed)
Pt continues to become shob with any exertion. Remains on 4L [place].

## 2020-07-20 NOTE — Progress Notes (Signed)
  Echocardiogram 2D Echocardiogram has been performed.  [name] 07/20/2020, 2:42 PM

## 2020-07-20 NOTE — ED Provider Notes (Signed)
[facility] EMERGENCY DEPARTMENT Provider Note   CSN: [id number] Arrival date & time: 07/19/20  1642     History Chief Complaint  Patient presents with  . Shortness of Breath    [name] is a 86 y.o. male.  HPI  Patient is an 86 year old male with a past medical history of CAD s/p CABG, CHF with preserved ejection fraction most recent EF 55-60% in 2019, pericardial effusion, COPD, OSA, DM, CKD 3, renal cell carcinoma s/p single kidney removal, tricuspid regurg.  Patient is presented today for progressive worsening shortness of breath for the past several months.  He states he has been taking his Lasix 4 tablets twice daily and states that he has been continuing to make urine but states he has noticed somewhat decreased urine recently.  He states he takes his blood pressure medications as prescribed as well.  He was told by his wound care nurse who sees him twice weekly that his blood pressure was extremely high.  He states that he feels more short of breath at night but also with exertion.  He states that he has noticed a significant difference from being able to walk quite some distance to only several feet before getting short of breath.  He denies any chest pain nausea vomiting fevers chills cough congestion lightheadedness or dizziness.   Patient has been vaccinated for COVID-19.   Patient also uses bilateral lower extremity swelling.  He does have wounds on his right leg which she has been seen by wound care nurse regularly for he denies any fevers or chills.  He states that the area has been painful but has been well surveilled by his wound care team.     Past Medical History:  Diagnosis Date  . Chronic kidney disease    tumor right kidney  . Coronary artery disease   . Diabetes mellitus    type 2  . Hypertension   . Myocardial infarction ([place])    1997  . Right renal mass     Patient Active Problem List   Diagnosis Date Noted  . Acute heart  failure with preserved ejection fraction (HFpEF) ([place]) 07/20/2020  . OSA (obstructive sleep apnea) 05/15/2019  . Acute on chronic diastolic heart failure ([place]) 05/06/2018  . Pleural effusion 05/06/2018  . COPD (chronic obstructive pulmonary disease) with emphysema ([place]) 04/29/2017  . Lung nodule 04/29/2017  . Hx of CABG 04/08/2017  . Chronic respiratory failure with hypoxia ([place]) 03/23/2017  . Pulmonary hypertension ([place]) 03/22/2017  . Shortness of breath 03/22/2017  . Pericardial effusion 03/22/2017  . Chronic diastolic heart failure ([place]) 02/25/2017  . Acute postoperative respiratory failure ([place]) 02/19/2017  . Chronic cholecystitis with calculus s/p robotic cholecystectomy 02/19/2017 02/19/2017  . Acute kidney injury superimposed on chronic kidney disease ([place]) 11/18/2016  . Nausea vomiting and diarrhea 11/18/2016  . Renal mass s/p right partial nephrectomy 02/19/2017 11/18/2016  . Gastroenteritis 11/18/2016  . Insulin dependent diabetes mellitus with complications 19/41/7408  . Elevated transaminase level 11/18/2016  . Dyslipidemia 05/12/2013  . Syncope 01/14/2012  . Hypoglycemia 01/14/2012  . Stage 3 chronic kidney disease ([place]) 01/14/2012  . Essential hypertension 01/14/2012    Past Surgical History:  Procedure Laterality Date  . CARDIAC CATHETERIZATION  1996  . CORONARY ARTERY BYPASS GRAFT    . DOPPLER ECHOCARDIOGRAPHY  2011  . HERNIA REPAIR    . IR GENERIC HISTORICAL  12/09/2016   IR RADIOLOGIST EVAL & MGMT 12/09/2016 [name], MD [facility] INTERV RAD  .  ROBOTIC ASSITED PARTIAL NEPHRECTOMY Right 02/19/2017   Procedure: XI ROBOTIC ASSITED PARTIAL NEPHRECTOMY;  Surgeon: [name], MD;  Location: [facility];  Service: Urology;  Laterality: Right;       Family History  Problem Relation Age of Onset  . Diabetes Mother   . Diabetes Sister     Social History   Tobacco Use  . Smoking status: Former Smoker    Packs/day: 0.30    Years: 45.00    Pack years: 13.50     Types: Cigarettes    Quit date: 02/15/1997    Years since quitting: 23.4  . Smokeless tobacco: Never Used  Vaping Use  . Vaping Use: Never used  Substance Use Topics  . Alcohol use: No  . Drug use: No    Home Medications Prior to Admission medications   Medication Sig Start Date End Date Taking? Authorizing Provider  acetaminophen (TYLENOL) 325 MG tablet Take 650 mg by mouth every 6 (six) hours as needed (for pain.).   Yes [provider]  amLODipine (NORVASC) 5 MG tablet Take 5 mg by mouth daily.  10/04/15  Yes [provider]  aspirin 81 MG EC tablet Take 81 mg by mouth daily. Swallow whole.   Yes [provider]  benazepril (LOTENSIN) 40 MG tablet Take 40 mg by mouth daily. 01/14/17  Yes [provider]  cloNIDine (CATAPRES) 0.1 MG tablet Take 0.1 mg by mouth 2 (two) times daily.   Yes [provider]  ezetimibe (ZETIA) 10 MG tablet Take 10 mg by mouth daily.   Yes [provider]  furosemide (LASIX) 20 MG tablet TAKE 3 TABLETS(60 MG) BY MOUTH TWICE DAILY Patient taking differently: Take 80 mg by mouth 2 (two) times daily.  04/04/20  Yes Hilty, Nadean Corwin, MD  HUMALOG KWIKPEN 100 UNIT/ML KiwkPen Inject 6-8 Units into the skin 3 (three) times daily.  07/25/15  Yes [provider]  Icosapent Ethyl (VASCEPA) 1 g CAPS Take 2 capsules (2 g total) by mouth 2 (two) times daily. 09/05/19  Yes Hilty, Nadean Corwin, MD  LANTUS SOLOSTAR 100 UNIT/ML Solostar Pen Inject 18 Units into the skin every evening.  02/23/19  Yes [provider]  nadolol (CORGARD) 40 MG tablet Take 40 mg by mouth daily.   Yes [provider]  potassium chloride (KLOR-CON) 10 MEQ tablet Take 1 tablet (10 mEq total) by mouth 2 (two) times daily. Patient taking differently: Take 20 mEq by mouth 2 (two) times daily.  04/24/20  Yes Kilroy, Luke K, PA-C  rosuvastatin (CRESTOR) 20 MG tablet TAKE 1 TABLET BY MOUTH EVERY DAY Patient taking differently: Take 20 mg  by mouth daily.  02/09/20  Yes Hilty, Nadean Corwin, MD  SYNTHROID 100 MCG tablet Take 100 mcg by mouth daily.  10/17/15  Yes [provider]    Allergies    Patient has no known allergies.  Review of Systems   Review of Systems  Constitutional: Positive for activity change and fatigue. Negative for chills and fever.  HENT: Negative for congestion.   Eyes: Negative for pain.  Respiratory: Positive for shortness of breath. Negative for cough.   Cardiovascular: Positive for leg swelling. Negative for chest pain.  Gastrointestinal: Negative for abdominal pain and vomiting.  Genitourinary: Negative for dysuria.  Musculoskeletal: Negative for myalgias and neck pain.  Skin: Negative for rash.  Neurological: Negative for dizziness, light-headedness and headaches.    Physical Exam Updated Vital Signs BP (!) 163/105 (BP Location: Left Arm)  Pulse 60   Temp 97.9 F (36.6 C) (Oral)   Resp 20   Ht 5\' 9"  (1.753 m)   Wt 88.8 kg   SpO2 (!) 86%   BMI 28.91 kg/m   Physical Exam Vitals and nursing note reviewed.  Constitutional:      General: He is not in acute distress.    Comments: 86 year old male in no acute distress and comfortably in bed.  HENT:     Head: Normocephalic and atraumatic.     Nose: Nose normal.     Mouth/Throat:     Mouth: Mucous membranes are dry.  Eyes:     General: No scleral icterus. Neck:     Comments: Faint JVD difficult to assess secondary to body habitus and positioning. Cardiovascular:     Rate and Rhythm: Normal rate and regular rhythm.     Pulses: Normal pulses.     Heart sounds: Normal heart sounds.  Pulmonary:     Effort: Pulmonary effort is normal. No respiratory distress.     Breath sounds: Rales present. No wheezing.     Comments: Crackles auscultated in bilateral bases posteriorly Abdominal:     Palpations: Abdomen is soft.     Tenderness: There is no abdominal tenderness. There is no guarding or rebound.  Musculoskeletal:     Cervical  back: Normal range of motion.     Right lower leg: Edema present.     Left lower leg: Edema present.     Comments: Bilateral lower extremity edema.  Notable right lower extremity wounds on the anterior lower leg.  This was wrapped in Ace wrap prior to my examination there is no significant edema underneath the wounds however left lower extremity is with 3+ pitting edema to the level of the upper thigh.  Similar edema to the right foot below the Ace wrap and up into the thigh.  Skin:    General: Skin is warm and dry.     Capillary Refill: Capillary refill takes less than 2 seconds.  Neurological:     Mental Status: He is alert. Mental status is at baseline.  Psychiatric:        Mood and Affect: Mood normal.        Behavior: Behavior normal.     ED Results / Procedures / Treatments   Labs (all labs ordered are listed, but only abnormal results are displayed) Labs Reviewed  CBC - Abnormal; Notable for the following components:      Result Value   RDW 16.7 (*)    All other components within normal limits  BASIC METABOLIC PANEL - Abnormal; Notable for the following components:   Glucose, Bld 53 (*)    Creatinine, Ser 1.93 (*)    GFR calc non Af Amer 31 (*)    GFR calc Af Amer 36 (*)    All other components within normal limits  BRAIN NATRIURETIC PEPTIDE - Abnormal; Notable for the following components:   B Natriuretic Peptide 2,618.0 (*)    All other components within normal limits  CBG MONITORING, ED - Abnormal; Notable for the following components:   Glucose-Capillary 38 (*)    All other components within normal limits  CBG MONITORING, ED - Abnormal; Notable for the following components:   Glucose-Capillary 200 (*)    All other components within normal limits  CBG MONITORING, ED - Abnormal; Notable for the following components:   Glucose-Capillary 108 (*)    All other components within normal limits  CBG MONITORING, ED -  Abnormal; Notable for the following components:    Glucose-Capillary 166 (*)    All other components within normal limits  TROPONIN I (HIGH SENSITIVITY) - Abnormal; Notable for the following components:   Troponin I (High Sensitivity) 58 (*)    All other components within normal limits  TROPONIN I (HIGH SENSITIVITY) - Abnormal; Notable for the following components:   Troponin I (High Sensitivity) 63 (*)    All other components within normal limits  SARS CORONAVIRUS 2 BY RT PCR [facility] ORDER, [place] LAB)  LACTIC ACID, PLASMA  HEMOGLOBIN A1C    EKG EKG Interpretation  Date/Time:  Friday July 19 2020 16:47:07 EDT Ventricular Rate:  50 PR Interval:  192 QRS Duration: 156 QT Interval:  502 QTC Calculation: 457 R Axis:   -159 Text Interpretation: Sinus bradycardia Right bundle branch block Abnormal ECG Confirmed by Ripley Fraise [phone]) on 07/20/2020 11:45:15 AM   Radiology DG Chest 2 View  Result Date: 07/19/2020 CLINICAL DATA:  Shortness of breath. EXAM: CHEST - 2 VIEW COMPARISON:  Most recent radiograph 04/21/2020, chest CT 05/03/2018 FINDINGS: Chronic cardiomegaly. Post median sternotomy and CABG. Stable mediastinal contours. Small right and moderate left pleural effusion, similar in degree to May exam. Interstitial thickening with slight increased suggesting pulmonary edema superimposed on emphysema. No pneumothorax. No acute osseous abnormalities are seen. IMPRESSION: 1. Findings suspicious for CHF. Increase interstitial thickening likely pulmonary edema superimposed on emphysema. 2. Chronic cardiomegaly. Small right and moderate left pleural effusion, similar in degree to prior exam. Electronically Signed   By: Keith Rake M.D.   On: 07/19/2020 23:04   ECHOCARDIOGRAM COMPLETE  Result Date: 07/20/2020    ECHOCARDIOGRAM REPORT   Patient Name:   [name] Date of Exam: 07/20/2020 Medical Rec #:  [id number]     Height:       69.0 in Accession #:    [id number]    Weight:       195.8 lb Date of  Birth:  03/08/1932      BSA:          2.047 m Patient Age:    89 years      BP:           171/79 mmHg Patient Gender: M             HR:           64 bpm. Exam Location:  Inpatient Procedure: 2D Echo Indications:    CHF 428  History:        Patient has prior history of Echocardiogram examinations, most                 recent 05/12/2018. Previous Myocardial Infarction, Prior CABG;                 Risk Factors:Hypertension, Diabetes, Dyslipidemia and Former                 Smoker.  Sonographer:    Jannett Celestine RDCS (AE) Referring Phys: [id number] [place]  1. Left ventricular ejection fraction, by estimation, is 60 to 65%. The left ventricle has normal function. The left ventricle has no regional wall motion abnormalities. There is moderate concentric left ventricular hypertrophy. Left ventricular diastolic parameters are consistent with Grade II diastolic dysfunction (pseudonormalization). Elevated left atrial pressure. There is the interventricular septum is flattened in systole and diastole, consistent with right ventricular pressure and volume  overload.  2. Right ventricular systolic function is  moderately reduced. The right ventricular size is moderately enlarged. There is severely elevated pulmonary artery systolic pressure. The estimated right ventricular systolic pressure is 67.6 mmHg.  3. Right atrial size was mildly dilated.  4. The mitral valve is grossly normal. Mild mitral valve regurgitation. No evidence of mitral stenosis.  5. Tricuspid valve regurgitation is mild to moderate.  6. The aortic valve is tricuspid. Aortic valve regurgitation is not visualized. Mild aortic valve sclerosis is present, with no evidence of aortic valve stenosis.  7. The inferior vena cava is dilated in size with >50% respiratory variability, suggesting right atrial pressure of 8 mmHg. Comparison(s): Changes from prior study are noted. RVSP improved from prior study. FINDINGS  Left Ventricle: Left ventricular ejection  fraction, by estimation, is 60 to 65%. The left ventricle has normal function. The left ventricle has no regional wall motion abnormalities. The left ventricular internal cavity size was normal in size. There is  moderate concentric left ventricular hypertrophy. The interventricular septum is flattened in systole and diastole, consistent with right ventricular pressure and volume overload. Left ventricular diastolic parameters are consistent with Grade II diastolic dysfunction (pseudonormalization). Elevated left atrial pressure. Right Ventricle: The right ventricular size is moderately enlarged. No increase in right ventricular wall thickness. Right ventricular systolic function is moderately reduced. There is severely elevated pulmonary artery systolic pressure. The tricuspid regurgitant velocity is 3.77 m/s, and with an assumed right atrial pressure of 8 mmHg, the estimated right ventricular systolic pressure is 19.5 mmHg. Left Atrium: Left atrial size was normal in size. Right Atrium: Right atrial size was mildly dilated. Pericardium: There is no evidence of pericardial effusion. Mitral Valve: The mitral valve is grossly normal. Mild mitral annular calcification. Mild mitral valve regurgitation. No evidence of mitral valve stenosis. Tricuspid Valve: The tricuspid valve is grossly normal. Tricuspid valve regurgitation is mild to moderate. Aortic Valve: The aortic valve is tricuspid. . There is mild thickening and mild calcification of the aortic valve. Aortic valve regurgitation is not visualized. Mild aortic valve sclerosis is present, with no evidence of aortic valve stenosis. There is mild thickening of the aortic valve. There is mild calcification of the aortic valve. Pulmonic Valve: The pulmonic valve was grossly normal. Pulmonic valve regurgitation is trivial. No evidence of pulmonic stenosis. Aorta: The aortic root is normal in size and structure. Venous: The inferior vena cava is dilated in size with  greater than 50% respiratory variability, suggesting right atrial pressure of 8 mmHg. IAS/Shunts: There is left bowing of the interatrial septum, suggestive of elevated right atrial pressure. The atrial septum is grossly normal.  LEFT VENTRICLE PLAX 2D LVIDd:         4.00 cm  Diastology LVIDs:         2.60 cm  LV e' lateral:   6.96 cm/s LV PW:         1.45 cm  LV E/e' lateral: 10.8 LV IVS:        1.48 cm  LV e' medial:    5.22 cm/s LVOT diam:     2.10 cm  LV E/e' medial:  14.4 LV SV:         66 LV SV Index:   32 LVOT Area:     3.46 cm  RIGHT VENTRICLE TAPSE (M-mode): 1.1 cm LEFT ATRIUM             Index       RIGHT ATRIUM           Index LA  diam:        5.00 cm 2.44 cm/m  RA Area:     18.10 cm LA Vol (A2C):   54.1 ml 26.42 ml/m RA Volume:   45.90 ml  22.42 ml/m LA Vol (A4C):   51.4 ml 25.11 ml/m LA Biplane Vol: 52.7 ml 25.74 ml/m  AORTIC VALVE LVOT Vmax:   91.40 cm/s LVOT Vmean:  60.000 cm/s LVOT VTI:    0.190 m  AORTA Ao Root diam: 2.90 cm MITRAL VALVE               TRICUSPID VALVE MV Area (PHT): 2.16 cm    TR Peak grad:   56.9 mmHg MV Decel Time: 352 msec    TR Vmax:        377.00 cm/s MV E velocity: 75.40 cm/s MV A velocity: 41.56 cm/s  SHUNTS MV E/A ratio:  1.81        Systemic VTI:  0.19 m                            Systemic Diam: 2.10 cm Eleonore Chiquito MD Electronically signed by Eleonore Chiquito MD Signature Date/Time: 07/20/2020/3:10:31 PM    Final     Procedures Procedures (including critical care time)  Medications Ordered in ED Medications  acetaminophen (TYLENOL) tablet 650 mg (has no administration in time range)  aspirin EC tablet 81 mg (81 mg Oral Given 07/20/20 1105)  cloNIDine (CATAPRES) tablet 0.1 mg (0.1 mg Oral Given 07/20/20 1109)  ezetimibe (ZETIA) tablet 10 mg (10 mg Oral Given 07/20/20 1106)  icosapent Ethyl (VASCEPA) 1 g capsule 2 g (2 g Oral Given 07/20/20 1106)  rosuvastatin (CRESTOR) tablet 20 mg (20 mg Oral Given 07/20/20 1105)  insulin glargine (LANTUS) injection 15 Units  (has no administration in time range)  levothyroxine (SYNTHROID) tablet 100 mcg (100 mcg Oral Given 07/20/20 1105)  sodium chloride flush (NS) 0.9 % injection 3 mL (3 mLs Intravenous Given 07/20/20 1104)  sodium chloride flush (NS) 0.9 % injection 3 mL (has no administration in time range)  0.9 %  sodium chloride infusion (has no administration in time range)  enoxaparin (LOVENOX) injection 40 mg (40 mg Subcutaneous Given 07/20/20 1104)  furosemide (LASIX) injection 80 mg (has no administration in time range)  potassium chloride SA (KLOR-CON) CR tablet 20 mEq (20 mEq Oral Given 07/20/20 1110)  nadolol (CORGARD) tablet 40 mg (40 mg Oral Given 07/20/20 1106)  insulin aspart (novoLOG) injection 0-15 Units (0 Units Subcutaneous Not Given 07/20/20 1228)  amLODipine (NORVASC) tablet 5 mg (has no administration in time range)  furosemide (LASIX) injection 40 mg (40 mg Intravenous Given 07/20/20 0736)  amLODipine (NORVASC) tablet 5 mg (5 mg Oral Given 07/20/20 0736)  nitroGLYCERIN (NITROGLYN) 2 % ointment 1 inch (1 inch Topical Given 07/20/20 0739)  furosemide (LASIX) injection 40 mg (40 mg Intravenous Given 07/20/20 1104)    ED Course  I have reviewed the triage vital signs and the nursing notes.  Pertinent labs & imaging results that were available during my care of the patient were reviewed by me and considered in my medical decision making (see chart for details).  Patient past medical history detailed above presented today for shortness of breath.  Does appear fluid overloaded.  Troponin mildly elevated delta similarly so very mild elevation.  EKG is unchanged from prior.  BNP is significantly elevated consistent with patient's CHF exacerbation which is clinically noted.  Lactic within normal  limits CBC without leukocytosis or anemia.  BMP without significant specific electrolyte abnormality although there is steadily increasing creatinine.  There was an episode of hypoglycemia patient was given some oral  glucose in the form of crackers and orange juice and has had normal glucose monitoring since then.  Will repeat.  Covid swab is pending.  Chest x-ray read by myself is consistent with fluid overload.  Patient administered nitro paste, Lasix, amlodipine, will recheck blood pressure and likely admit.  Clinical Course as of Jul 21 1603  Sat Jul 20, 2020  0705 Chest xray reviewed -- mild pulm edema consistent with exam. Agree with radiologist.   IMPRESSION: 1. Findings suspicious for CHF. Increase interstitial thickening likely pulmonary edema superimposed on emphysema. 2. Chronic cardiomegaly. Small right and moderate left pleural effusion, similar in degree to prior exam.   [WF]  0705 EKG with RBBB and sinus bradycardia.No acute ST-T Wave changes   [WF]  0909 Discussed with Dr. Koleen Distance of internal medicine teaching service.    [WF]    Clinical Course User Index [WF] Tedd Sias, [place]   I discussed this case with my attending physician who cosigned this note including patient's presenting symptoms, physical exam, and planned diagnostics and interventions. Attending physician stated agreement with plan or made changes to plan which were implemented.   Attending physician assessed patient at bedside.  JSHAWN HURTA was evaluated in Emergency Department on 07/20/2020 for the symptoms described in the history of present illness. He was evaluated in the context of the global COVID-19 pandemic, which necessitated consideration that the patient might be at risk for infection with the SARS-CoV-2 virus that causes COVID-19. Institutional protocols and algorithms that pertain to the evaluation of patients at risk for COVID-19 are in a state of rapid change based on information released by regulatory bodies including the CDC and federal and state organizations. These policies and algorithms were followed during the patient's care in the ED.  MDM Rules/Calculators/A&P                           Patient mated to internal medicine Dr. Karen Kitchens.   Final Clinical Impression(s) / ED Diagnoses Final diagnoses:  Acute on chronic congestive heart failure, unspecified heart failure type ([place])  SOB (shortness of breath)  Kidney function abnormal    Rx / DC Orders ED Discharge Orders    None       Tedd Sias, [place] 07/20/20 1607    Breck Coons, MD 07/21/20 [phone]

## 2020-07-21 DIAGNOSIS — N1832 Chronic kidney disease, stage 3b: Secondary | ICD-10-CM

## 2020-07-21 DIAGNOSIS — I5031 Acute diastolic (congestive) heart failure: Secondary | ICD-10-CM

## 2020-07-21 DIAGNOSIS — Z955 Presence of coronary angioplasty implant and graft: Secondary | ICD-10-CM

## 2020-07-21 DIAGNOSIS — I272 Pulmonary hypertension, unspecified: Secondary | ICD-10-CM

## 2020-07-21 DIAGNOSIS — E1122 Type 2 diabetes mellitus with diabetic chronic kidney disease: Secondary | ICD-10-CM

## 2020-07-21 DIAGNOSIS — I872 Venous insufficiency (chronic) (peripheral): Secondary | ICD-10-CM

## 2020-07-21 DIAGNOSIS — Z794 Long term (current) use of insulin: Secondary | ICD-10-CM

## 2020-07-21 DIAGNOSIS — I130 Hypertensive heart and chronic kidney disease with heart failure and stage 1 through stage 4 chronic kidney disease, or unspecified chronic kidney disease: Principal | ICD-10-CM

## 2020-07-21 DIAGNOSIS — J449 Chronic obstructive pulmonary disease, unspecified: Secondary | ICD-10-CM

## 2020-07-21 DIAGNOSIS — E039 Hypothyroidism, unspecified: Secondary | ICD-10-CM

## 2020-07-21 DIAGNOSIS — E119 Type 2 diabetes mellitus without complications: Secondary | ICD-10-CM

## 2020-07-21 LAB — BASIC METABOLIC PANEL
Anion gap: 10 (ref 5–15)
BUN: 17 mg/dL (ref 8–23)
CO2: 27 mmol/L (ref 22–32)
Calcium: 8.7 mg/dL — ABNORMAL LOW (ref 8.9–10.3)
Chloride: 107 mmol/L (ref 98–111)
Creatinine, Ser: 1.71 mg/dL — ABNORMAL HIGH (ref 0.61–1.24)
GFR calc Af Amer: 41 mL/min — ABNORMAL LOW (ref >60)
GFR calc non Af Amer: 35 mL/min — ABNORMAL LOW (ref >60)
Glucose, Bld: 130 mg/dL — ABNORMAL HIGH (ref 70–99)
Potassium: 3.6 mmol/L (ref 3.5–5.1)
Sodium: 144 mmol/L (ref 135–145)

## 2020-07-21 LAB — GLUCOSE, CAPILLARY
Glucose-Capillary: 129 mg/dL — ABNORMAL HIGH (ref 70–99)
Glucose-Capillary: 88 mg/dL (ref 70–99)
Glucose-Capillary: 103 mg/dL — ABNORMAL HIGH (ref 70–99)
Glucose-Capillary: 125 mg/dL — ABNORMAL HIGH (ref 70–99)

## 2020-07-21 LAB — LIPID PANEL
Cholesterol: 95 mg/dL (ref 0–200)
HDL: 28 mg/dL — ABNORMAL LOW (ref >40)
LDL Cholesterol: 52 mg/dL (ref 0–99)
Total CHOL/HDL Ratio: 3.4 RATIO
Triglycerides: 76 mg/dL (ref <150)
VLDL: 15 mg/dL (ref 0–40)

## 2020-07-21 LAB — TSH: TSH: 3.57 u[IU]/mL (ref 0.350–4.500)

## 2020-07-21 LAB — MAGNESIUM: Magnesium: 2 mg/dL (ref 1.7–2.4)

## 2020-07-21 NOTE — Progress Notes (Signed)
   Subjective: No acute events overnight. RT placed pt on CPAP dream station for the night in auto titrate w/3 Lpm bled into the system. Patient was evaluated at the bedside.  States he is doing well.  His breathing is better and the swelling his legs are improved.   Objective:  Vital signs in last 24 hours: Vitals:   07/21/20 0615 07/21/20 0754 07/21/20 0823 07/21/20 1147  BP: (!) 159/94 140/75 (!) 149/79 140/69  Pulse: (!) 57 (!) 51 (!) 51 (!) 50  Resp: 17 18 18 18   Temp: 97.6 F (36.4 C) 98.9 F (37.2 C)  98.4 F (36.9 C)  TempSrc: Oral Oral  Oral  SpO2: 93% 92% 96% 90%  Weight:      Height:       General: Pleasant elderly man laying in bed on 3 L . No acute distress. Head: Normocephalic/atraumatic Neck: +JVD to jaw line at 90 degrees Cardiovascular: regular rate and rhythm, no murmurs, rubs or gallops. Pulmonary: tachypneic, effort normal, bibasilar inspiratory rales with decreased breath sounds bilaterally, scattered inspiratory wheezes mid-upper lung fields bilaterally Abdominal: Soft, nontender, nondistended, bowel sounds normal Extremities: 1+ pitting edema bilateral lower extremities Skin: Warm and dry, right lower anterior leg with erythema, scaling, and wet appearing consistent with venous insufficiency, left lower extremity with early vesicle and bullae formation Neurological: A&Ox3. Moves all extremities. Normal sensation. No focal deficit Psychiatric: Normal mood and affect  Assessment/Plan:  Active Problems:   Stage 3 chronic kidney disease   Essential hypertension   Pulmonary hypertension (HCC)   COPD (chronic obstructive pulmonary disease) with emphysema (HCC)   Acute heart failure with preserved ejection fraction (HFpEF) (HCC)   Diabetes ([place])  Acute HFpEF BNP 2618, EF 60-65%. Troponin minimally elevated to 63 likely due to demand in the setting of acute heart failure exacerbation. Pt with worsened shortness of breath, lower extremity edema, JVD to jaw  line. Presented after 2 months of worsening lower extremity swelling and weight gain.  Echo shows normal LV EF 60-65%, moderate LVH grade 2 diastolic dysfunction and severe pulmonary hypertension SF pt having significant improvement in his oxygenation and shortness of breath after diuresis this morning with 80 mg Lasix. Will watch potassium with aggressive diuresis, currently 3.6.  Today, patient reports improvement in shortness of breath and lower extremity edema. --Continue IV lasix 80 mg BID --Strict I&O --F/u morning BMP --Daily weight  Type 2 DM A1c 6.4, insulin dependent. CBG looks more controlled today. --Daily CBGs --SSI, moderate, modify as needed --Lantus 15 units QHS  Hypothyroidism TSH normal at 3.57 --Continue synthroid 100 mcg  Hypertension Patient presented with BP of 163/105. Holding his ACE/ARB in the setting acute kidney injury.   BP is currently stable. --Cont home clonidine, amlodipine, and nadolol --Holding ACE/ARB --Daily vitals  Hyperlipidemia Lipid panel significant for HDL of 28. --Continue rosuvastatin 20 mg QD  Venous Insufficiency Chronic erythematous, wet appearing wound with vesicles and scaling on left lower leg. Began after lower leg vein was harvested for CABG procedure. States the wound is healing. --Consider pressure stockings as needed --Consider wound care referral  Prior to Admission Living Arrangement: Home Anticipated Discharge Location: Home Barriers to Discharge: Diuresing Dispo: Anticipated discharge in approximately 1-2 day(s).   Lacinda Axon, MD 07/21/2020, 2:50 PM Pager: @MYPAGER @ After 5pm on weekdays and 1pm on weekends: On Call pager [phone]

## 2020-07-21 NOTE — Plan of Care (Signed)
°  Problem: Education: °Goal: Ability to demonstrate management of disease process will improve °Outcome: Progressing °Goal: Ability to verbalize understanding of medication therapies will improve °Outcome: Progressing °Goal: Individualized Educational Video(s) °Outcome: Progressing °  °

## 2020-07-21 NOTE — Progress Notes (Signed)
RT placed pt on CPAP dream station for the night on auto titrate max 10 min 5 w/3 Lpm bled into the system. Pt respiratory status is stable at this time. RT will continue to monitor.

## 2020-07-21 NOTE — Evaluation (Signed)
Occupational Therapy Evaluation Patient Details Name: [name] MRN: [id number] DOB: [birth year] Today's Date: 07/21/2020    History of Present Illness Pt is an 86 y.o. male admitted 07/19/20 with SOB, worse with exertion, and RLE sores. PMH includes HTN, CAD, CKD, DM, wears 2L O2 baseline.   Clinical Impression   This 86 y/o male presents with the above. PTA pt reports being mod independent with ADL and functional mobility. Pt currently performing ADL and room level mobility tasks grossly at minguard assist level. Pt reports improved dyspnea today compared to initial admit. Use of 3L O2 with activity however noted SpO2 decreased to 84% with standing activity, returning to >/=90% given seated rest and cues for deep breathing techniques. Will continue to monitor however pt may require increased supplemental O2 with standing activity at this time. HR in the 50s throughout. Issued energy conservation handout during session - will benefit from continued review/education. Pt to benefit from continued acute OT services, recommend follow up [facility] services after discharge to maximize his overall safety and independence with ADL and mobility.     Follow Up Recommendations  Home health OT;Supervision/Assistance - 24 hour    Equipment Recommendations  None recommended by OT           Precautions / Restrictions Precautions Precautions: Fall;Other (comment) Precaution Comments: DOE; watch O2 sats Restrictions Weight Bearing Restrictions: No      Mobility Bed Mobility Overal bed mobility: Needs Assistance Bed Mobility: Supine to Sit     Supine to sit: Supervision     General bed mobility comments: for safety  Transfers Overall transfer level: Needs assistance Equipment used: None Transfers: Sit to/from Stand Sit to Stand: Min guard         General transfer comment: Reliant on momentum and UE support to push into standing    Balance Overall balance assessment: Needs  assistance Sitting-balance support: Feet supported Sitting balance-Leahy Scale: Good     Standing balance support: Single extremity supported;No upper extremity supported;During functional activity Standing balance-Leahy Scale: Fair                             ADL either performed or assessed with clinical judgement   ADL Overall ADL's : Needs assistance/impaired Eating/Feeding: Modified independent;Sitting   Grooming: Min guard;Standing;Wash/dry face;Oral care   Upper Body Bathing: Sitting;Set up   Lower Body Bathing: Min guard;Sit to/from stand   Upper Body Dressing : Set up;Sitting   Lower Body Dressing: Min guard;Sit to/from stand Lower Body Dressing Details (indicate cue type and reason): donning socks and underwear during session, increased time/effort  Toilet Transfer: Min guard;Ambulation Toilet Transfer Details (indicate cue type and reason): simulated via transfer to recliner, room level mobility  Toileting- Clothing Manipulation and Hygiene: Min guard;Sitting/lateral lean;Sit to/from stand       Functional mobility during ADLs: Min guard                           Pertinent Vitals/Pain Pain Assessment: No/denies pain     Hand Dominance     Extremity/Trunk Assessment Upper Extremity Assessment Upper Extremity Assessment: Generalized weakness   Lower Extremity Assessment Lower Extremity Assessment: Defer to PT evaluation       Communication Communication Communication: HOH   Cognition Arousal/Alertness: Awake/alert Behavior During Therapy: WFL for tasks assessed/performed Overall Cognitive Status: Within Functional Limits for tasks assessed  General Comments: WFL for simple tasks; [facility]   General Comments       Exercises     Shoulder Instructions      Home Living Family/patient expects to be discharged to:: Private residence Living Arrangements: Spouse/significant  other Available Help at Discharge: Family;Available 24 hours/day Type of Home: House Home Access: Stairs to enter [employer] of Steps: 2-3   Home Layout: One level     Bathroom Shower/Tub: Occupational psychologist: Standard     Home Equipment: Environmental consultant - 2 wheels;Other (comment);Shower seat   Additional Comments: Wife uses RW due to arthritis. Has aide come weekly [facility]?) to check RLE wounds      Prior Functioning/Environment Level of Independence: Independent with assistive device(s)        Comments: Has recently been using 'walking stick' inside and outside home. Reports independent with ADL        OT Problem List: Decreased strength;Decreased range of motion;Decreased activity tolerance;Impaired balance (sitting and/or standing);Cardiopulmonary status limiting activity      OT Treatment/Interventions: Self-care/ADL training;Therapeutic exercise;Energy conservation;DME and/or AE instruction;Therapeutic activities;Patient/family education;Balance training    OT Goals(Current goals can be found in the care plan section) Acute Rehab OT Goals Patient Stated Goal: Decreased SOB when I move OT Goal Formulation: With patient Time For Goal Achievement: 08/04/20 Potential to Achieve Goals: Good  OT Frequency: Min 2X/week   Barriers to D/C:            Co-evaluation              AM-PAC OT "6 Clicks" Daily Activity     Outcome Measure Help from another person eating meals?: None Help from another person taking care of personal grooming?: A Little Help from another person toileting, which includes using toliet, bedpan, or urinal?: A Little Help from another person bathing (including washing, rinsing, drying)?: A Little Help from another person to put on and taking off regular upper body clothing?: A Little Help from another person to put on and taking off regular lower body clothing?: A Little 6 Click Score: 19   End of Session Equipment Utilized  During Treatment: Gait belt;Oxygen Nurse Communication: Mobility status  Activity Tolerance: Patient tolerated treatment well Patient left: in chair;with call bell/phone within reach;with chair alarm set  OT Visit Diagnosis: Muscle weakness (generalized) (M62.81);Unsteadiness on feet (R26.81)                Time: 8887-5797 OT Time Calculation (min): 34 min Charges:  OT General Charges $OT Visit: 1 Visit OT Evaluation $OT Eval Moderate Complexity: 1 Mod OT Treatments $Self Care/Home Management : 8-22 mins  Lou Cal, OT Acute Rehabilitation Services Pager [phone] Office [place] 07/21/2020, 5:25 PM

## 2020-07-21 NOTE — Progress Notes (Signed)
RT placed pt on CPAP dream station for the night in auto titrate w/3 Lpm bled into the system. RT will continue to monitor.

## 2020-07-22 LAB — BASIC METABOLIC PANEL
Anion gap: 9 (ref 5–15)
BUN: 17 mg/dL (ref 8–23)
CO2: 27 mmol/L (ref 22–32)
Calcium: 8.8 mg/dL — ABNORMAL LOW (ref 8.9–10.3)
Chloride: 109 mmol/L (ref 98–111)
Creatinine, Ser: 1.84 mg/dL — ABNORMAL HIGH (ref 0.61–1.24)
GFR calc Af Amer: 38 mL/min — ABNORMAL LOW (ref >60)
GFR calc non Af Amer: 32 mL/min — ABNORMAL LOW (ref >60)
Glucose, Bld: 89 mg/dL (ref 70–99)
Potassium: 3.7 mmol/L (ref 3.5–5.1)
Sodium: 145 mmol/L (ref 135–145)

## 2020-07-22 LAB — GLUCOSE, CAPILLARY
Glucose-Capillary: 71 mg/dL (ref 70–99)
Glucose-Capillary: 75 mg/dL (ref 70–99)
Glucose-Capillary: 156 mg/dL — ABNORMAL HIGH (ref 70–99)
Glucose-Capillary: 150 mg/dL — ABNORMAL HIGH (ref 70–99)
Glucose-Capillary: 138 mg/dL — ABNORMAL HIGH (ref 70–99)

## 2020-07-22 LAB — MAGNESIUM: Magnesium: 2.1 mg/dL (ref 1.7–2.4)

## 2020-07-22 MED ORDER — AMLODIPINE BESYLATE 10 MG PO TABS
10.0000 mg | ORAL_TABLET | Freq: Every day | ORAL | Status: DC
  Administered 2020-07-23 – 2020-07-24 (×2): 10 mg via ORAL
  Filled 2020-07-22 (×2): qty 1

## 2020-07-22 MED ORDER — NADOLOL 20 MG PO TABS
20.0000 mg | ORAL_TABLET | Freq: Every day | ORAL | Status: DC
  Administered 2020-07-23 – 2020-07-24 (×2): 20 mg via ORAL
  Filled 2020-07-22 (×2): qty 1

## 2020-07-22 NOTE — Progress Notes (Signed)
Subjective:  Hospital Day:2  No overnight events.  [name] was evaluated at bedside this morning and reports feeling "real good." He states that he feels much less shortness of breath compared to when he first came in, as evidenced by his ability to walk up and down the hall more vigorously. He reports that his leg swelling is improving. He had a BM this morning, which made him feel better.  Objective: Vital signs in last 24 hours: Vitals:   07/21/20 2139 07/22/20 0416 07/22/20 0814 07/22/20 1122  BP:  (!) 164/85 (!) 159/66 140/78  Pulse: 64 (!) 51 60 (!) 51  Resp: 20 15 18 18   Temp:  98.1 F (36.7 C)  98.4 F (36.9 C)  TempSrc:  Oral  Oral  SpO2: 96% 95% 96% 99%  Weight:  87.1 kg    Height:       Weight change: 0.2 kg  Intake/Output Summary (Last 24 hours) at 07/22/2020 1453 Last data filed at 07/22/2020 1311 Gross per 24 hour  Intake 1300 ml  Output 1125 ml  Net 175 ml   Physical Exam Vitals and nursing note reviewed.  Constitutional: no acute distress, sitting comfortably in bed Neck: +JVD lower neck at 90 degrees Cardiovascular: regular rate and rhythm, normal heart sounds Pulmonary: effort normal, bibasilar inspiratory rales with decreased breath sounds bilaterally Abdominal: flat, nontender, no rebound tenderness, bowel sounds normal Musculoskeletal: 2+ pitting edema left lower extremities Skin: warm and dry, right lower medial-anterior leg with erythema, scaling, and ulceration consistent with venous stasis dermatitis Neurological: alert, no focal deficit Psychiatric: normal mood and affect  Lab Results: CBC Latest Ref Rng & Units 07/19/2020 04/21/2020 05/16/2019  WBC 4.0 - 10.5 K/uL 7.6 7.5 8.3  Hemoglobin 13.0 - 17.0 g/dL 14.9 15.5 15.1  Hematocrit 39 - 52 % 47.5 48.8 45.5  Platelets 150 - 400 K/uL 200 176 207.0   CMP Latest Ref Rng & Units 07/22/2020 07/21/2020 07/19/2020  Glucose 70 - 99 mg/dL 89 130(H) 53(L)  BUN 8 - 23 mg/dL 17 17 18   Creatinine 0.61 -  1.24 mg/dL 1.84(H) 1.71(H) 1.93(H)  Sodium 135 - 145 mmol/L 145 144 142  Potassium 3.5 - 5.1 mmol/L 3.7 3.6 3.9  Chloride 98 - 111 mmol/L 109 107 106  CO2 22 - 32 mmol/L 27 27 27   Calcium 8.9 - 10.3 mg/dL 8.8(L) 8.7(L) 9.3  Total Protein 6.5 - 8.1 g/dL - - -  Total Bilirubin 0.3 - 1.2 mg/dL - - -  Alkaline Phos 38 - 126 U/L - - -  AST 15 - 41 U/L - - -  ALT 17 - 63 U/L - - -     Assessment/Plan: Principal Problem:   Acute heart failure with preserved ejection fraction (HFpEF) (HCC) Active Problems:   Stage 3 chronic kidney disease   Essential hypertension   Pulmonary hypertension (HCC)   COPD (chronic obstructive pulmonary disease) with emphysema (HCC)   Diabetes (HCC)  TEAGUE GOYNES is a 86 year old man with a PMHx significant for HTN, HLD, COPD, OSA, CKD 3b, CAD s/p CABG, and HFpEF who presents today after 1 week of worsening shortness of breath. Edema and shortness of breath significantly improved with aggressive diuresis. Anticipate d/c in 1-2 days pending improvement in fluid status.  Acute HFpEF On admission BNP 2618, EF 60-65%. Troponin minimally elevated to 63 likely due to demand in the setting of acute heart failure exacerbation.Pt appears much more comfortable today with decreased JVD. Still has 2+ pitting  edema to left lower leg. Will continue to diurese aggressively with lasix 80 mg BID. Will watch potassium, currently 3.7. --Continue IV lasix 80 mg BID --Strict I&O --F/u morning BMP --Daily weight  Type 2 DM A1c 6.4, insulin dependent. CBG looks better controlled today, 75-156. --Daily CBGs --SSI, moderate, modify as needed --Lantus 15 units QHS  Hypothyroidism TSH normal at 3.57 --Continuesynthroid 100 mcg  Hypertension Patient presented with BP of 163/105. Holding his ACE/ARB in the setting acute kidney injury.BP is currently stable. Heart rate low in the 50-60s. Decrease nadolol --Cont home clonidine, amlodipine, and nadolol --Holding  ACE/ARB --Daily vitals  Hyperlipidemia Lipid panel significant for HDL of 28. --Continuerosuvastatin 20 mg QD  Venous Stasis Dermatitis with ulceration Erythematous, wet appearing wound with vesicles and scaling on right lower leg. Began several months ago. Right lower leg veinwasharvestedfor CABG procedure over 20 years ago. States the wound is healing. --Consider pressure stockingsas needed --Wound carereferral  Prior to Admission Living Arrangement: Home Anticipated Discharge Location: Home Barriers to Discharge: Diuresing Dispo: Anticipated discharge in approximately 1-2 day(s).   This is a Careers information officer Note.  The care of the patient was discussed with Dr. Coy Saunas and Koleen Distance and the assessment and plan formulated with their assistance.   LOS: 2 days   Calvert Cantor, Medical Student 07/22/2020, 2:53 PM

## 2020-07-22 NOTE — Plan of Care (Signed)
  Problem: Education: Goal: Ability to demonstrate management of disease process will improve Outcome: Progressing Goal: Ability to verbalize understanding of medication therapies will improve Outcome: Progressing Goal: Individualized Educational Video(s) Outcome: Progressing   Problem: Clinical Measurements: Goal: Ability to maintain clinical measurements within normal limits will improve Outcome: Progressing Goal: Will remain free from infection Outcome: Progressing

## 2020-07-22 NOTE — Hospital Course (Addendum)
8/17  "I feel real good." Reports feeling much less short of breath compared to when he first came in, as evidenced by his ability to walk up and down the hall more vigorously. Had a BM this morning, which made him feel better as well.  States his leg swelling has improved.  Nurses come to his house twice a week to change dressings.  Had vein taken out of his R leg. Reports he noticed changes in the skin 25 years after that.  Venous stasis ulcerations.   8/18  Evaluated at bedside this morning. Patient eating breakfast when we arrived. Reports his breathing is better. Notes that when he tries to pull himself up in bed, he is not limited by his breathing. Reports he walked the halls yesterday.   Discussed using compression stocking at home. Counseled him that swelling will improve over time. Reiterated imporance of following up with his PCP in the next week. Says he is able to call his PCP anytime to schedule an appointment. Agreeable to having [place] call to schedule an appointment. [phone].  States he feels like he is ready to go home.

## 2020-07-22 NOTE — Progress Notes (Signed)
Orthopedic Tech Progress Note Patient Details:  [name] September 11, 1932 [id number]  Ortho Devices Type of Ortho Device: Louretta Parma boot Ortho Device/Splint Location: RLE Ortho Device/Splint Interventions: Ordered, Application   Post Interventions Patient Tolerated: Well Instructions Provided: Care of device   [name] 07/22/2020, 2:29 PM

## 2020-07-22 NOTE — Consult Note (Signed)
WOC Nurse Consult Note: Patient receiving care in [place]. Reason for Consult: "extremity venous stasis" Wound type: Venous stasis changes to RLE, no actual open wounds at this time. RLE is dry, scaling. Pressure Injury POA: Yes/No/NA Measurement: Wound bed: Drainage (amount, consistency, odor)  Periwound: Dressing procedure/placement/frequency: RN to wash and dry RLE, apply [place] Moisturizing Ointment, then call Ortho Tech to apply unna boot. Patient will need to have unna boot changed in one week, and resume home changes upon discharge. Thank you for the consult.  Discussed plan of care with the patient and bedside nurse.  [place] nurse will not follow at this time.  Please re-consult the [place] team if needed.  Val Riles, RN, MSN, CWOCN, CNS-BC, pager [phone]

## 2020-07-23 DIAGNOSIS — G4733 Obstructive sleep apnea (adult) (pediatric): Secondary | ICD-10-CM

## 2020-07-23 LAB — BASIC METABOLIC PANEL
Anion gap: 8 (ref 5–15)
BUN: 20 mg/dL (ref 8–23)
CO2: 28 mmol/L (ref 22–32)
Calcium: 8.5 mg/dL — ABNORMAL LOW (ref 8.9–10.3)
Chloride: 106 mmol/L (ref 98–111)
Creatinine, Ser: 1.73 mg/dL — ABNORMAL HIGH (ref 0.61–1.24)
GFR calc Af Amer: 41 mL/min — ABNORMAL LOW (ref >60)
GFR calc non Af Amer: 35 mL/min — ABNORMAL LOW (ref >60)
Glucose, Bld: 167 mg/dL — ABNORMAL HIGH (ref 70–99)
Potassium: 3.6 mmol/L (ref 3.5–5.1)
Sodium: 142 mmol/L (ref 135–145)

## 2020-07-23 LAB — GLUCOSE, CAPILLARY
Glucose-Capillary: 126 mg/dL — ABNORMAL HIGH (ref 70–99)
Glucose-Capillary: 112 mg/dL — ABNORMAL HIGH (ref 70–99)
Glucose-Capillary: 234 mg/dL — ABNORMAL HIGH (ref 70–99)
Glucose-Capillary: 216 mg/dL — ABNORMAL HIGH (ref 70–99)

## 2020-07-23 LAB — MAGNESIUM: Magnesium: 2 mg/dL (ref 1.7–2.4)

## 2020-07-23 NOTE — Progress Notes (Signed)
Physical Therapy Treatment Patient Details Name: [name] MRN: [id number] DOB: 03-30-1932 Today's Date: 07/23/2020    History of Present Illness Pt is an 86 y.o. male admitted 07/19/20 with SOB, worse with exertion, and RLE sores. PMH includes HTN, CAD, CKD, DM, wears 2L O2 baseline.    PT Comments    Focus of session increased awareness of energy conservation, discussed need to finish task like walking to end of hall in one bout is not always the best in terms of increased fatigue. Pt utilizes momentum to come to standing but ultimately is min guard for safety. Pt is min guard for ambulation without AD, however he occasionally reaches for furniture, hand rail to steady. D/c plans remain appropriate at this time. PT will continue to follow acutely.      Follow Up Recommendations  No PT follow up;Supervision - Intermittent     Equipment Recommendations  None recommended by PT       Precautions / Restrictions Precautions Precautions: Fall;Other (comment) Precaution Comments: DOE Restrictions Weight Bearing Restrictions: No    Mobility  Bed Mobility               General bed mobility comments: up in chair on entry   Transfers Overall transfer level: Needs assistance Equipment used: None Transfers: Sit to/from Stand Sit to Stand: Min guard         General transfer comment: Reliant on momentum and UE support to push into standing  Ambulation/Gait Ambulation/Gait assistance: Min guard Gait Distance (Feet): 120 Feet (1x120, 1x40, 1x80) Assistive device: None Gait Pattern/deviations: Step-through pattern;Decreased stride length;Shuffle;Trunk flexed Gait velocity: Decreased Gait velocity interpretation: <1.8 ft/sec, indicate of risk for recurrent falls General Gait Details: min guard for safety without DME, pt reaches out to furniture and hand rails to balance, pt with 3/4 DoE with ambulation of 120 feet and encourage to take standing rest break, pt reports "I can do  it" responded that PT was sure that he could do it but that he would be so exhausted. encouraged 2 more standing rest breaks with ambulation and pt report feeling better when he return to room.         Balance Overall balance assessment: Needs assistance   Sitting balance-Leahy Scale: Good       Standing balance-Leahy Scale: Fair Standing balance comment: can static stand without support, reaches out to steady himself with dynamic motion                             Cognition Arousal/Alertness: Awake/alert Behavior During Therapy: WFL for tasks assessed/performed Overall Cognitive Status: Within Functional Limits for tasks assessed                                 General Comments: WFL for simple tasks; HOH         General Comments General comments (skin integrity, edema, etc.): Pt on 2L O2 via [place] on entry with SaO2 >90%O2 with ambulation pt with 3/4 DoE and with standing rest break noted to have SaO2 81%O2, cued in purse lip breathing however SaO2 did not increase, increased to 4L O2 via [place] and SaO2 maintained >90% with ambulation, with sitting back in recliner able to return to 2L O2 and maintain SaO2 >90%O2, UNNA boot on R LE intact      Pertinent Vitals/Pain Pain Assessment: No/denies pain  PT Goals (current goals can now be found in the care plan section) Acute Rehab PT Goals PT Goal Formulation: With patient Time For Goal Achievement: 08/03/20 Potential to Achieve Goals: Good Progress towards PT goals: Progressing toward goals    Frequency    Min 3X/week      PT Plan Current plan remains appropriate       AM-PAC PT "6 Clicks" Mobility   Outcome Measure  Help needed turning from your back to your side while in a flat bed without using bedrails?: None Help needed moving from lying on your back to sitting on the side of a flat bed without using bedrails?: None Help needed moving to and from a bed to a chair (including a  wheelchair)?: A Little Help needed standing up from a chair using your arms (e.g., wheelchair or bedside chair)?: A Little Help needed to walk in hospital room?: A Little Help needed climbing 3-5 steps with a railing? : A Little 6 Click Score: 20    End of Session Equipment Utilized During Treatment: Oxygen Activity Tolerance: Patient tolerated treatment well Patient left: in chair;with call bell/phone within reach;with chair alarm set Nurse Communication: Mobility status PT Visit Diagnosis: Other abnormalities of gait and mobility (R26.89);Muscle weakness (generalized) (M62.81)     Time: 0539-7673 PT Time Calculation (min) (ACUTE ONLY): 28 min  Charges:  $Gait Training: 8-22 mins                     Haeven Nickle B. Migdalia Dk PT, DPT Acute Rehabilitation Services Pager [phone] Office [phone]    [place] 07/23/2020, 5:47 PM

## 2020-07-23 NOTE — TOC Transition Note (Addendum)
Transition of Care [facility]) - CM/SW Discharge Note   Patient Details  Name: [name] MRN: [id number] Date of Birth: 1932-01-02  Transition of Care [facility]) CM/SW Contact:  Zenon Mayo, RN Phone Number: 07/23/2020, 10:10 AM   Clinical Narrative:    NCM spoke with patient at bedside, offered choice for [facility], he states he has someone that comes by to do his unaboot but he can not remember the name, he states to call his wife, NCM contacted wife, she sates they have a Therapist, sports with [place].   NCM contacted Dian Situ with [facility] and notified him that patient may dc today or tomorrow and will need resumption.  TOC will continue to follow for dc needs.  Final next level of care: [place] Barriers to Discharge: Continued Medical Work up   Patient Goals and CMS Choice Patient states their goals for this hospitalization and ongoing recovery are:: get better CMS Medicare.gov Compare Post Acute Care list provided to:: Patient Represenative (must comment) Choice offered to / list presented to : Spouse  Discharge Placement                       Discharge Plan and Services                  DME Agency: NA       HH Arranged: RN  [place] Agency: [place] Date [place]: 07/23/20 Time HH Agency Contacted: 1010 Representative spoke with at [place]: [place] Determinants of Health ([place]) Interventions     Readmission Risk Interventions No flowsheet data found.

## 2020-07-23 NOTE — Progress Notes (Signed)
Subjective:  Hospital Day: 3  No overnight events.  [name] was evaluated at bedside this morning. Prior to entering the room, [name] was seen ambulating well with O2 in the hall. He reports that he had good energy and no shortness of breath during this but felt like he needed to catch his breath afterward. This is a major improvement from his admission day when he had shortness of breath at rest. He reports that the swelling in his left leg has improved. Overall he states that though his swelling and shortness of breath have improved he feels that he is not yet back to his baseline.  Objective: Vital signs in last 24 hours: Vitals:   07/22/20 1122 07/22/20 2008 07/22/20 2243 07/23/20 0511  BP: 140/78 (!) 170/80  (!) 168/84  Pulse: (!) 51 (!) 52 (!) 51 (!) 53  Resp: 18 18 16 18   Temp: 98.4 F (36.9 C) 98.4 F (36.9 C)  98.1 F (36.7 C)  TempSrc: Oral Oral  Oral  SpO2: 99% 91% 99% 92%  Weight:    85.5 kg  Height:       Weight change: -1.552 kg  Intake/Output Summary (Last 24 hours) at 07/23/2020 0734 Last data filed at 07/23/2020 0516 Gross per 24 hour  Intake 1040 ml  Output 1925 ml  Net -885 ml   Physical Exam Vitals and nursing note reviewed.  Constitutional: no acute distress, able to speak comfortably in full sentences sitting unsupported in a chair Neck:+JVDlower neck at 90 degrees Cardiovascular: regular rate and rhythm, normal heart sounds Pulmonary:effort normal,bibasilar decreased breath sounds, no rales appreciated Abdominal: flat, nontender, no rebound tenderness, bowel sounds normal Musculoskeletal:1+ pitting edema left lower extremity Skin: warm and dry, right lower extremity wound wrapped in bandage Neurological: alert, no focal deficit Psychiatric: normal mood and affect  Lab Results: CMP Latest Ref Rng & Units 07/23/2020 07/22/2020 07/21/2020  Glucose 70 - 99 mg/dL 167(H) 89 130(H)  BUN 8 - 23 mg/dL 20 17 17   Creatinine 0.61 - 1.24 mg/dL 1.73(H)  1.84(H) 1.71(H)  Sodium 135 - 145 mmol/L 142 145 144  Potassium 3.5 - 5.1 mmol/L 3.6 3.7 3.6  Chloride 98 - 111 mmol/L 106 109 107  CO2 22 - 32 mmol/L 28 27 27   Calcium 8.9 - 10.3 mg/dL 8.5(L) 8.8(L) 8.7(L)  Total Protein 6.5 - 8.1 g/dL - - -  Total Bilirubin 0.3 - 1.2 mg/dL - - -  Alkaline Phos 38 - 126 U/L - - -  AST 15 - 41 U/L - - -  ALT 17 - 63 U/L - - -   Assessment/Plan: Principal Problem:   Acute heart failure with preserved ejection fraction (HFpEF) (HCC) Active Problems:   Stage 3 chronic kidney disease   Essential hypertension   Pulmonary hypertension (HCC)   COPD (chronic obstructive pulmonary disease) with emphysema (HCC)   Diabetes ([place])  [name] is a 86 year old man with a PMHx significant for HTN, HLD, COPD, OSA, CKD 3b, CAD s/p CABG, and HFpEF who presents today after 1 week of worsening shortness of breath. Edema and shortness of breath significantly improved with aggressive diuresis. Anticipate d/c in 1-2 days pending further improvement in fluid status.  Acute HFpEF On admission BNP 2618, EF 60-65%. Troponin minimally elevated to 63 likely due to demand in the setting of acute heart failure exacerbation.Pt appears much more comfortable today with decreased JVD. Still has 2+ pitting edema to left lower leg. Will continue to diurese aggressively  with lasix 80 mg BID. Also will provide pressure stockings and leg elevation instructions for assistance in reducing fluid load. Anticipate possible discharge tomorrow if he continues to progress. --ContinueIV lasix 80 mg BID --Compression stockings --Strict I&O --F/u morning BMP --Daily weight  Type 2 DM A1c 6.4, insulin dependent.CBG looks better controlled today, 75-156. --Daily CBGs --SSI, moderate, modify as needed --Lantus 15 units QHS  Hypothyroidism TSH normal at 3.57 --Continuesynthroid 100 mcg  Hypertension Patient presented with BP of 163/105. Holding his ACE/ARB in the setting acute kidney  injury.BP is currently stable. Heart rate low in the 50-60s. Decreased morning nadolol dose to 20 mg and increased amlodipine dose to 10 mg. Will assess for improvement in BP and HR throughout the day. --Cont home clonidine --start Nadolol 20mg  QD --start Amlodipine 10 mg QD --HoldingACE/ARB --Daily vitals  Hyperlipidemia Lipid panel significant for HDL of 28. --Continuerosuvastatin 20 mg QD  Venous Stasis Dermatitis with ulceration Erythematous, wet appearing wound with vesicles and scaling on right lower leg. Began several months ago. Right lower leg veinwasharvestedfor CABG procedure over 20 years ago.States the wound is healing. Wound care has applied bandage and fitted [name] with an [employer]. Will need f/u outpatient with PCP to track progression. --Wound carereferral --UNNA boots  Prior to Admission Living Arrangement:Home Anticipated Discharge Location:Home Barriers to Discharge:Diuresing Dispo: Anticipated discharge in approximately1-2day(s).   This is a Careers information officer Note.  The care of the patient was discussed with Dr. Vladimir Creeks and the assessment and plan formulated with their assistance.   LOS: 3 days   Calvert Cantor, Medical Student 07/23/2020, 7:34 AM

## 2020-07-24 ENCOUNTER — Other Ambulatory Visit: Payer: Self-pay | Admitting: Student

## 2020-07-24 LAB — BASIC METABOLIC PANEL
Anion gap: 6 (ref 5–15)
BUN: 20 mg/dL (ref 8–23)
CO2: 29 mmol/L (ref 22–32)
Calcium: 8.7 mg/dL — ABNORMAL LOW (ref 8.9–10.3)
Chloride: 107 mmol/L (ref 98–111)
Creatinine, Ser: 1.62 mg/dL — ABNORMAL HIGH (ref 0.61–1.24)
GFR calc Af Amer: 44 mL/min — ABNORMAL LOW (ref >60)
GFR calc non Af Amer: 38 mL/min — ABNORMAL LOW (ref >60)
Glucose, Bld: 114 mg/dL — ABNORMAL HIGH (ref 70–99)
Potassium: 3.6 mmol/L (ref 3.5–5.1)
Sodium: 142 mmol/L (ref 135–145)

## 2020-07-24 LAB — MAGNESIUM: Magnesium: 2 mg/dL (ref 1.7–2.4)

## 2020-07-24 LAB — GLUCOSE, CAPILLARY: Glucose-Capillary: 120 mg/dL — ABNORMAL HIGH (ref 70–99)

## 2020-07-24 MED ORDER — POTASSIUM CHLORIDE ER 10 MEQ PO TBCR: 20.0000 meq | EXTENDED_RELEASE_TABLET | Freq: Two times a day (BID) | ORAL | 0 refills | Status: DC

## 2020-07-24 MED ORDER — AMLODIPINE BESYLATE 10 MG PO TABS: 10.0000 mg | ORAL_TABLET | Freq: Every day | ORAL | 2 refills | Status: DC

## 2020-07-24 MED ORDER — NADOLOL 20 MG PO TABS: 20.0000 mg | ORAL_TABLET | Freq: Every day | ORAL | 0 refills | Status: DC

## 2020-07-24 MED ORDER — FUROSEMIDE 20 MG PO TABS: 80.0000 mg | ORAL_TABLET | Freq: Two times a day (BID) | ORAL | 0 refills | Status: DC

## 2020-07-24 NOTE — Plan of Care (Signed)

## 2020-07-24 NOTE — Progress Notes (Signed)
Subjective: No acute events overnight. Patient was evaluated at the bedside while in the middle of breakfast. Patient states he is doing much better and is now about to getting up from the bed without being tired. He reports being able to walk down the hall without much SOB or fatigue but states that he has to pace himself because of his age. His mind wants to do things but he understands that his body is not able to do those things like he could do in his 35s and 40s. Patient states he will appreciate if we set up an appointment with his PCP for him. He is appreciative of the team's care.   Objective:  Vital signs in last 24 hours: Vitals:   07/23/20 2003 07/24/20 0432 07/24/20 0433 07/24/20 0918  BP: 134/75 (!) 149/68  (!) 148/75  Pulse: (!) 49 (!) 49 (!) 50 (!) 50  Resp: 19 16  18   Temp: 98 F (36.7 C) 98.7 F (37.1 C)    TempSrc: Oral Oral    SpO2: 94% 91% 92% 92%  Weight:   85.4 kg   Height:       General: Pleasant elderly man laying in bed eating breakfast. On home 2L [place]. No acute distress. Head: Normocephalic/atraumatic Neck: JVD resolved at 90 degrees Cardiovascular: RRR. No m/r/g.  Pulmonary: Lungs CTAB. Bibasilar decreased breath sounds. No wheezing or rales.  Abdominal: Soft, nontender, nondistended, bowel sounds normal Extremities: 1+ pitting LLE edema to the mid shin. UNNA boots on RLE.  Skin: Warm and dry. RLE venous stasis wound wrapped in bandage and covered with UNNA boots. LLE with compression socks on.  Neurological: No focal deficits. A&O x3. Moves all extremities. Normal sensation.  Psychiatric: Normal mood and affect.  Assessment/Plan:  Principal Problem:   Acute heart failure with preserved ejection fraction (HFpEF) (HCC) Active Problems:   Stage 3 chronic kidney disease   Essential hypertension   Pulmonary hypertension (HCC)   COPD (chronic obstructive pulmonary disease) with emphysema ([place])   Diabetes ([place])  KALAN YELEY is a 86 year old man with  a PMHx significant for HTN, HLD, COPD, OSA, CKD 3b, CAD s/p CABG, and HFpEF who presents today after 1 week of worsening shortnessof breath. Edema and shortness of breath significantly improved with aggressive diuresis. Plan to discharge today.   Acute HFpEF On admissionBNP 2618, EF 60-65%. Troponin minimally elevated to 63 likely due to demand in the setting of acute heart failure exacerbation.Patient continued to make significant improvement in respiratory status as well as LE edema. Has regained much of his energy and DOE has improved. Satting well on home O2 [facility]). JVD resolved at 90 degrees. Advised to keep pressure stockings on during the day and elevate legs at night to aid in faster resolution of his LE edema. Plan to discharge today with f/u with PCP --Continue lasix 80 mg po BID at home --Wear compression stockings on Left leg during the day --UNNA boots on right leg, home health nurse to manage  Type 2 DM A1c 6.4, insulin dependent. Blood sugar not well-controlled during hospitalization. Last 3 CBG, 112->234->216. Follow up with PCP for adjustments to diabetic regimen.  --Continue home Lantus dose of 18 units QHS --Continue Humalog 6-8 units TID --Follow up with PCP.   Hypothyroidism TSH normal at 3.57. Continuesynthroid 100 mcg at home  Hypertension Patient presented with BP of 163/105. Held his ACE/ARB in the setting acute kidney injury.BP is currently stable.Heart rate low in the 50-60s. Decreased  nadolol dose to 20 mg and increased amlodipine dose to 10 mg. HR and BP stable on discharge. Will have pt f/u with PCP for BP check and med adjustments. --Cont home clonidine --Continue Nadolol 20mg  QD --Continue Amlodipine 10 mg QD --Restart ACE/ARB --Follow up with PCP  Hyperlipidemia Lipid panel significant for HDL of 28. Continuerosuvastatin 20 mg QD  Venous Stasis Dermatitis with ulceration Erythematous, wet appearing wound with vesicles and scaling  onrightlower leg. Began several months ago. Right lower leg veinwasharvestedfor CABG procedureover 20 years ago.States the wound is healing. Wound care has applied bandage and fitted [name] with an [employer]. Will need f/u outpatient with PCP to track progression. --Wound carereferral --Continue UNNA boots  Prior to Admission Living Arrangement: Home Anticipated Discharge Location: Home w/ home health Barriers to Discharge: None Dispo: Anticipated discharge today  Lacinda Axon, MD 07/24/2020, 10:52 AM Pager: [phone] Internal Medicine Teaching Service After 5pm on weekdays and 1pm on weekends: On Call pager [phone]

## 2020-07-24 NOTE — Progress Notes (Signed)
D/C instructions given and reviewed. Questions answered and encouraged to call with any further questions. IV removed. Tolerated well. Pt stated he would call wife for ride. This nurse encouraged pt to have wife call me with any questions about D/C instructions as well.

## 2020-07-24 NOTE — Discharge Instructions (Signed)
[name], it was a pleasure taking care of you during your hospitalization.  You have make significant progress with your breathing and your oxygen level while at the hospital.  We are discharging you home with home health so you will have a nurse come by to assess you weekly.  Continue to will the compression socks during the day and elevate your feet at night.  We have also scheduled a follow-up appointment with your PCP.  Continue to take your medications as prescribed and make sure to go to your appointment next week.  Take care, Dr. Coy Saunas

## 2020-07-24 NOTE — Discharge Summary (Signed)
Name: [name] MRN: [id number] DOB: 10/26/1932 86 y.o. PCP: Joseph Gravel, MD  Date of Admission: 07/19/2020  4:44 PM Date of Discharge: 07/24/2020 Attending Physician: No att. providers found  Discharge Diagnosis: 1. Acute exacerbation of Heart Failure with preserved Ejection Fraction  Discharge Medications: Allergies as of 07/24/2020   No Known Allergies     Medication List    TAKE these medications   acetaminophen 325 MG tablet Commonly known as: TYLENOL Take 650 mg by mouth every 6 (six) hours as needed (for pain.).   amLODipine 10 MG tablet Commonly known as: NORVASC Take 1 tablet (10 mg total) by mouth daily. What changed:   medication strength  how much to take   aspirin 81 MG EC tablet Take 81 mg by mouth daily. Swallow whole.   benazepril 40 MG tablet Commonly known as: LOTENSIN Take 40 mg by mouth daily.   cloNIDine 0.1 MG tablet Commonly known as: CATAPRES Take 0.1 mg by mouth 2 (two) times daily.   ezetimibe 10 MG tablet Commonly known as: ZETIA Take 10 mg by mouth daily.   furosemide 20 MG tablet Commonly known as: LASIX Take 4 tablets (80 mg total) by mouth 2 (two) times daily. What changed: See the new instructions.   HumaLOG KwikPen 100 UNIT/ML KwikPen Generic drug: insulin lispro Inject 6-8 Units into the skin 3 (three) times daily.   Lantus SoloStar 100 UNIT/ML Solostar Pen Generic drug: insulin glargine Inject 18 Units into the skin every evening.   nadolol 20 MG tablet Commonly known as: CORGARD Take 1 tablet (20 mg total) by mouth daily. What changed:   medication strength  how much to take   potassium chloride 10 MEQ tablet Commonly known as: KLOR-CON Take 2 tablets (20 mEq total) by mouth 2 (two) times daily.   rosuvastatin 20 MG tablet Commonly known as: CRESTOR TAKE 1 TABLET BY MOUTH EVERY DAY   Synthroid 100 MCG tablet Generic drug: levothyroxine Take 100 mcg by mouth daily.   Vascepa 1 g capsule Generic  drug: icosapent Ethyl Take 2 capsules (2 g total) by mouth 2 (two) times daily.            Discharge Care Instructions  (From admission, onward)         Start     Ordered   07/24/20 0000  Discharge wound care:       Comments: Home health nurse will provide wound care   07/24/20 0919          Disposition and follow-up:   JosephJoseph [name] was discharged from [name] in Stable condition.  At the hospital follow up visit please address:  1.  Heart failure: Dyspnea on exertion had improved on discharge but patient continued to have some LE edema. Assess symptoms and adjust diuretics to optimize his care.  2. Blood pressure: We modified his BP agents due to elevated BP and low HR. Adjust medications to optimize BP.   2.  Labs / imaging needed at time of follow-up: BMP to check kidney function  3.  Pending labs/ test needing follow-up: None  Follow-up Appointments:  Follow-up Information    Joseph, Hasley [name] Follow up.   Specialty: Home Health Services Why: [name] , Contact information: [place] [address] [phone]        Associates, [name] on 07/29/2020.   Specialty: Rheumatology Why: Please go to this appointment at 10 am. You will  see Joseph Moment, FNP Contact information: [address] [place] [address] [place] Hospital Course by problem list: 1. Acute HFpEF Patient presented to the ED after 1 week of worsening shortnessof breath. On admission,BNP 2618, EF 60-65%. Troponin minimally elevated to 63, which was likely due to demand in the setting of acute heart failure exacerbation. He had JVD on exam and crackles in his lungs. He was treated with Lasix 80 mg IV daily. Patient continued to make significant improvement in respiratory status as well as LE edema. Gained energy and strength with support from PT. Continued to have good oxygen saturation  on 2-3 LNC. JVD resolved at 90 degrees. He was provided with a pressure stockings and advised to keep it on during the day and elevate legs at night to aid in faster resolution of his LE edema. We continued his lasix 80 mg po BID on discharge with plans for him to follow up with his PCP for adjustments to his diuretics based on his symptoms. He was sent home with home health to manage his symptoms and wounds.   Type 2 DM A1c 6.4, insulin dependent. Patient was started on sliding scale insulin but blood sugar remained elevated during hospitalization. Last 3 CBG, 234->216-->120. Restarted home diabetic regimen on discharge. Follow up with PCP for adjustments to diabetic regimen.   Hypothyroidism TSH normal at 3.57. Continuesynthroid 100 mcg at home  Hypertension Patient presented with BP of 163/105. We held his ACE/ARB in the setting acute kidney injury.His heart rate dropped to the 50-60s so we decreased his nadolol dose to 20 mg and increased amlodipine dose to 10 mg. HR and BP were stable on discharge. We restarted his home clonidine and ACE/ARB on discharge. Will have pt f/u with PCP for BP check and med adjustments.  Hyperlipidemia Lipid panel significant for HDL of 28. We continuehis rosuvastatin 20 mg QD.  Venous Stasis Dermatitis with ulceration Patient presented with an erythematous, wet appearing wound with vesicles and scaling onrightlower leg that began several months ago. Right lower leg veinwasharvestedfor CABG procedureover 20 years ago.States the wound is healing.Wound care applied bandage and fitted [name] with an [employer]. Will need f/u outpatient with PCP to track progression. Patient was discharged with home health w/ RN.  Discharge Vitals:   BP (!) 148/75 (BP Location: Right Arm)   Pulse (!) 50   Temp 98.7 F (37.1 C) (Oral)   Resp 18   Ht 5\' 9"  (1.753 m)   Wt 85.4 kg   SpO2 92%   BMI 27.81 kg/m   Pertinent Labs, Studies, and Procedures:  BMP Latest  Ref Rng & Units 07/24/2020 07/23/2020 07/22/2020  Glucose 70 - 99 mg/dL 114(H) 167(H) 89  BUN 8 - 23 mg/dL 20 20 17   Creatinine 0.61 - 1.24 mg/dL 1.62(H) 1.73(H) 1.84(H)  BUN/Creat Ratio 10 - 24 - - -  Sodium 135 - 145 mmol/L 142 142 145  Potassium 3.5 - 5.1 mmol/L 3.6 3.6 3.7  Chloride 98 - 111 mmol/L 107 106 109  CO2 22 - 32 mmol/L 29 28 27   Calcium 8.9 - 10.3 mg/dL 8.7(L) 8.5(L) 8.8(L)     Discharge Instructions: Discharge Instructions    (HEART FAILURE PATIENTS) Call MD:  Anytime you have any of the following symptoms: 1) 3 pound weight gain in 24 hours or 5 pounds in 1 week 2) shortness of breath, with or without a dry hacking  cough 3) swelling in the hands, feet or stomach 4) if you have to sleep on extra pillows at night in order to breathe.   Complete by: As directed    Diet - low sodium heart healthy   Complete by: As directed    Discharge wound care:   Complete by: As directed    Home health nurse will provide wound care   Increase activity slowly   Complete by: As directed       Signed: Lacinda Axon, MD 07/24/2020, 9:41 PM   Pager: [phone] Internal Medicine Teaching Service

## 2020-07-26 DIAGNOSIS — I2510 Atherosclerotic heart disease of native coronary artery without angina pectoris: Secondary | ICD-10-CM | POA: Diagnosis not present

## 2020-07-26 DIAGNOSIS — J9611 Chronic respiratory failure with hypoxia: Secondary | ICD-10-CM | POA: Diagnosis not present

## 2020-07-26 DIAGNOSIS — J439 Emphysema, unspecified: Secondary | ICD-10-CM | POA: Diagnosis not present

## 2020-07-26 DIAGNOSIS — Z4800 Encounter for change or removal of nonsurgical wound dressing: Secondary | ICD-10-CM | POA: Diagnosis not present

## 2020-07-26 DIAGNOSIS — I130 Hypertensive heart and chronic kidney disease with heart failure and stage 1 through stage 4 chronic kidney disease, or unspecified chronic kidney disease: Secondary | ICD-10-CM | POA: Diagnosis not present

## 2020-07-26 DIAGNOSIS — L97819 Non-pressure chronic ulcer of other part of right lower leg with unspecified severity: Secondary | ICD-10-CM | POA: Diagnosis not present

## 2020-07-29 ENCOUNTER — Encounter: Payer: Self-pay | Admitting: Pulmonary Disease

## 2020-07-29 ENCOUNTER — Other Ambulatory Visit: Payer: Self-pay

## 2020-07-29 ENCOUNTER — Ambulatory Visit (INDEPENDENT_AMBULATORY_CARE_PROVIDER_SITE_OTHER): Payer: Medicare Other | Admitting: Pulmonary Disease

## 2020-07-29 VITALS — BP 122/70 | HR 54 | Temp 97.2°F | Ht 69.0 in | Wt 198.2 lb

## 2020-07-29 DIAGNOSIS — G4733 Obstructive sleep apnea (adult) (pediatric): Secondary | ICD-10-CM

## 2020-07-29 DIAGNOSIS — Z87891 Personal history of nicotine dependence: Secondary | ICD-10-CM | POA: Diagnosis not present

## 2020-07-29 DIAGNOSIS — I130 Hypertensive heart and chronic kidney disease with heart failure and stage 1 through stage 4 chronic kidney disease, or unspecified chronic kidney disease: Secondary | ICD-10-CM | POA: Diagnosis not present

## 2020-07-29 DIAGNOSIS — I272 Pulmonary hypertension, unspecified: Secondary | ICD-10-CM | POA: Diagnosis not present

## 2020-07-29 DIAGNOSIS — E1151 Type 2 diabetes mellitus with diabetic peripheral angiopathy without gangrene: Secondary | ICD-10-CM | POA: Diagnosis not present

## 2020-07-29 DIAGNOSIS — E039 Hypothyroidism, unspecified: Secondary | ICD-10-CM | POA: Diagnosis not present

## 2020-07-29 DIAGNOSIS — Z9989 Dependence on other enabling machines and devices: Secondary | ICD-10-CM | POA: Diagnosis not present

## 2020-07-29 DIAGNOSIS — Z4800 Encounter for change or removal of nonsurgical wound dressing: Secondary | ICD-10-CM | POA: Diagnosis not present

## 2020-07-29 DIAGNOSIS — Z9981 Dependence on supplemental oxygen: Secondary | ICD-10-CM | POA: Diagnosis not present

## 2020-07-29 DIAGNOSIS — L97819 Non-pressure chronic ulcer of other part of right lower leg with unspecified severity: Secondary | ICD-10-CM | POA: Diagnosis not present

## 2020-07-29 DIAGNOSIS — I2510 Atherosclerotic heart disease of native coronary artery without angina pectoris: Secondary | ICD-10-CM | POA: Diagnosis not present

## 2020-07-29 DIAGNOSIS — J9611 Chronic respiratory failure with hypoxia: Secondary | ICD-10-CM | POA: Diagnosis not present

## 2020-07-29 DIAGNOSIS — I872 Venous insufficiency (chronic) (peripheral): Secondary | ICD-10-CM | POA: Diagnosis not present

## 2020-07-29 DIAGNOSIS — C641 Malignant neoplasm of right kidney, except renal pelvis: Secondary | ICD-10-CM | POA: Diagnosis not present

## 2020-07-29 DIAGNOSIS — I252 Old myocardial infarction: Secondary | ICD-10-CM | POA: Diagnosis not present

## 2020-07-29 DIAGNOSIS — I5032 Chronic diastolic (congestive) heart failure: Secondary | ICD-10-CM

## 2020-07-29 DIAGNOSIS — E1122 Type 2 diabetes mellitus with diabetic chronic kidney disease: Secondary | ICD-10-CM | POA: Diagnosis not present

## 2020-07-29 DIAGNOSIS — Z951 Presence of aortocoronary bypass graft: Secondary | ICD-10-CM | POA: Diagnosis not present

## 2020-07-29 DIAGNOSIS — J449 Chronic obstructive pulmonary disease, unspecified: Secondary | ICD-10-CM | POA: Diagnosis not present

## 2020-07-29 DIAGNOSIS — Z794 Long term (current) use of insulin: Secondary | ICD-10-CM | POA: Diagnosis not present

## 2020-07-29 DIAGNOSIS — N183 Chronic kidney disease, stage 3 unspecified: Secondary | ICD-10-CM | POA: Diagnosis not present

## 2020-07-29 DIAGNOSIS — I5033 Acute on chronic diastolic (congestive) heart failure: Secondary | ICD-10-CM | POA: Diagnosis not present

## 2020-07-29 NOTE — Progress Notes (Signed)
Patient arrived to office with no oxygen. 79% on room air, 3 liters continuous oxygen placed via nasal cannula. Patient encouraged to carry his oxygen at all times.

## 2020-07-29 NOTE — Patient Instructions (Addendum)
Obstructive sleep apnea -Continue using CPAP on a regular basis  Obstructive lung disease Hypoxemic respiratory failure -Encouraged to continue using oxygen on a regular basis  Follow-up in 3 months  Encouraged to call with any significant concerns

## 2020-07-29 NOTE — Progress Notes (Signed)
[name]    [id number]    20-Apr-1932  Primary Care Physician:Kim, Jeneen Rinks, MD  Referring Physician: [name], MD [address] [place],  [place] [address]  Chief complaint:   Shortness of breath  HPI:  Patient with multiple comorbidities including congestive heart failure, obstructive sleep apnea, hypoxic respiratory failure States he is doing relatively well Was recently hospitalized for shortness of breath -Approaching baseline  Denies any chest pains or chest discomfort  Regarding his CPAP -States he tolerates it well with no significant complaints or concerns about it -Feels it helps   Outpatient Encounter Medications as of 07/29/2020  Medication Sig  . acetaminophen (TYLENOL) 325 MG tablet Take 650 mg by mouth every 6 (six) hours as needed (for pain.).  Marland Kitchen amLODipine (NORVASC) 10 MG tablet Take 1 tablet (10 mg total) by mouth daily.  Marland Kitchen aspirin 81 MG EC tablet Take 81 mg by mouth daily. Swallow whole.  . benazepril (LOTENSIN) 40 MG tablet Take 40 mg by mouth daily.  . cloNIDine (CATAPRES) 0.1 MG tablet Take 0.1 mg by mouth 2 (two) times daily.  Marland Kitchen ezetimibe (ZETIA) 10 MG tablet Take 10 mg by mouth daily.  . furosemide (LASIX) 20 MG tablet Take 4 tablets (80 mg total) by mouth 2 (two) times daily.  Marland Kitchen HUMALOG KWIKPEN 100 UNIT/ML KiwkPen Inject 6-8 Units into the skin 3 (three) times daily.   Vanessa Kick Ethyl (VASCEPA) 1 g CAPS Take 2 capsules (2 g total) by mouth 2 (two) times daily.  Marland Kitchen LANTUS SOLOSTAR 100 UNIT/ML Solostar Pen Inject 18 Units into the skin every evening.   . nadolol (CORGARD) 20 MG tablet Take 1 tablet (20 mg total) by mouth daily.  . potassium chloride (KLOR-CON) 10 MEQ tablet Take 2 tablets (20 mEq total) by mouth 2 (two) times daily.  . rosuvastatin (CRESTOR) 20 MG tablet TAKE 1 TABLET BY MOUTH EVERY DAY (Patient taking differently: Take 20 mg by mouth daily. )  . SYNTHROID 100 MCG tablet Take 100 mcg by mouth daily.    No  facility-administered encounter medications on file as of 07/29/2020.    Allergies as of 07/29/2020  . (No Known Allergies)    Past Medical History:  Diagnosis Date  . Chronic kidney disease    tumor right kidney  . Coronary artery disease   . Diabetes mellitus    type 2  . Hypertension   . Myocardial infarction ([place])    1997  . Right renal mass     Past Surgical History:  Procedure Laterality Date  . CARDIAC CATHETERIZATION  1996  . CORONARY ARTERY BYPASS GRAFT    . DOPPLER ECHOCARDIOGRAPHY  2011  . HERNIA REPAIR    . IR GENERIC HISTORICAL  12/09/2016   IR RADIOLOGIST EVAL & MGMT 12/09/2016 [name], MD [facility] INTERV RAD  . ROBOTIC ASSITED PARTIAL NEPHRECTOMY Right 02/19/2017   Procedure: XI ROBOTIC ASSITED PARTIAL NEPHRECTOMY;  Surgeon: [name], MD;  Location: [facility];  Service: Urology;  Laterality: Right;    Family History  Problem Relation Age of Onset  . Diabetes Mother   . Diabetes Sister     Social History   Socioeconomic History  . Marital status: Married    Spouse name: Not on file  . Number of children: Not on file  . Years of education: Not on file  . Highest education level: Not on file  Occupational History  . Not on file  Tobacco Use  . Smoking status: Former Smoker    Packs/day: 0.30    Years: 45.00    Pack years: 13.50    Types: Cigarettes    Quit date: 02/15/1997    Years since quitting: 23.4  . Smokeless tobacco: Never Used  Vaping Use  . Vaping Use: Never used  Substance and Sexual Activity  . Alcohol use: No  . Drug use: No  . Sexual activity: Yes  Other Topics Concern  . Not on file  Social History Narrative  . Not on file   Social Determinants of Health   Financial Resource Strain:   . Difficulty of Paying Living Expenses: Not on file  Food Insecurity:   . Worried About Charity fundraiser in the Last Year: Not on file  . Ran Out of Food in the Last Year: Not on file  Transportation Needs:   . Lack of  Transportation (Medical): Not on file  . Lack of Transportation (Non-Medical): Not on file  Physical Activity:   . Days of Exercise per Week: Not on file  . Minutes of Exercise per Session: Not on file  Stress:   . Feeling of Stress : Not on file  Social Connections:   . Frequency of Communication with Friends and Family: Not on file  . Frequency of Social Gatherings with Friends and Family: Not on file  . Attends Religious Services: Not on file  . Active Member of Clubs or Organizations: Not on file  . Attends Archivist Meetings: Not on file  . Marital Status: Not on file  Intimate Partner Violence:   . Fear of Current or Ex-Partner: Not on file  . Emotionally Abused: Not on file  . Physically Abused: Not on file  . Sexually Abused: Not on file    Review of Systems  Constitutional: Positive for fatigue.  Respiratory: Positive for apnea and shortness of breath.   Psychiatric/Behavioral: Positive for sleep disturbance.    Vitals:   07/29/20 1143  BP: 122/70  Pulse: (!) 54  Temp: (!) 97.2 F (36.2 C)  SpO2: 94%     Physical Exam Constitutional:      Appearance: Normal appearance.  HENT:     Mouth/Throat:     Mouth: Mucous membranes are moist.  Eyes:     General:        Right eye: No discharge.        Left eye: No discharge.  Cardiovascular:     Rate and Rhythm: Normal rate and regular rhythm.     Heart sounds: No murmur heard.  No friction rub.  Pulmonary:     Effort: No respiratory distress.     Breath sounds: No stridor. No wheezing or rhonchi.  Musculoskeletal:        General: Normal range of motion.     Cervical back: No rigidity or tenderness.  Skin:    General: Skin is warm.  Neurological:     General: No focal deficit present.     Mental Status: He is alert.      Data Reviewed: Compliance data reviewed showing machine set at 5-15 95 percentile pressure of 8.5 AHI of 2.5  X-ray 07/19/2020 -Some pulmonary congestion  Assessment:    Obstructive sleep apnea -Encouraged to continue using CPAP on a regular basis -Compliance shows adequate compliance, AHI controlled  Hypoxic respiratory failure -Encouraged to check his oxygen regularly -Encouraged to use oxygen with activity  Chronic obstructive pulmonary disease  Pulmonary hypertension -Secondary  to heart failure, secondary to emphysema -Continue current management  Chronic diastolic heart failure -Continue current care  Plan/Recommendations: Continue CPAP on a regular basis  Continue bronchodilators Continue diuretics  Regular exercise as tolerated  Oxygen supplementation to keep saturations greater than 89%  Follow-up in 3 months  Sherrilyn Rist MD [facility] 07/29/2020, 11:57 AM  CC: [name], MD

## 2020-07-30 DIAGNOSIS — R609 Edema, unspecified: Secondary | ICD-10-CM | POA: Diagnosis not present

## 2020-07-30 DIAGNOSIS — I130 Hypertensive heart and chronic kidney disease with heart failure and stage 1 through stage 4 chronic kidney disease, or unspecified chronic kidney disease: Secondary | ICD-10-CM | POA: Diagnosis not present

## 2020-07-30 DIAGNOSIS — E1122 Type 2 diabetes mellitus with diabetic chronic kidney disease: Secondary | ICD-10-CM | POA: Diagnosis not present

## 2020-07-30 DIAGNOSIS — I272 Pulmonary hypertension, unspecified: Secondary | ICD-10-CM | POA: Diagnosis not present

## 2020-07-30 DIAGNOSIS — I5033 Acute on chronic diastolic (congestive) heart failure: Secondary | ICD-10-CM | POA: Diagnosis not present

## 2020-07-30 DIAGNOSIS — I2510 Atherosclerotic heart disease of native coronary artery without angina pectoris: Secondary | ICD-10-CM | POA: Diagnosis not present

## 2020-07-30 DIAGNOSIS — I50812 Chronic right heart failure: Secondary | ICD-10-CM | POA: Diagnosis not present

## 2020-07-30 DIAGNOSIS — N183 Chronic kidney disease, stage 3 unspecified: Secondary | ICD-10-CM | POA: Diagnosis not present

## 2020-07-30 DIAGNOSIS — R0600 Dyspnea, unspecified: Secondary | ICD-10-CM | POA: Diagnosis not present

## 2020-07-30 DIAGNOSIS — E8770 Fluid overload, unspecified: Secondary | ICD-10-CM | POA: Diagnosis not present

## 2020-07-30 DIAGNOSIS — E1151 Type 2 diabetes mellitus with diabetic peripheral angiopathy without gangrene: Secondary | ICD-10-CM | POA: Diagnosis not present

## 2020-07-31 ENCOUNTER — Encounter (INDEPENDENT_AMBULATORY_CARE_PROVIDER_SITE_OTHER): Payer: Self-pay

## 2020-07-31 DIAGNOSIS — E1151 Type 2 diabetes mellitus with diabetic peripheral angiopathy without gangrene: Secondary | ICD-10-CM | POA: Diagnosis not present

## 2020-07-31 DIAGNOSIS — E1122 Type 2 diabetes mellitus with diabetic chronic kidney disease: Secondary | ICD-10-CM | POA: Diagnosis not present

## 2020-07-31 DIAGNOSIS — I5033 Acute on chronic diastolic (congestive) heart failure: Secondary | ICD-10-CM | POA: Diagnosis not present

## 2020-07-31 DIAGNOSIS — N183 Chronic kidney disease, stage 3 unspecified: Secondary | ICD-10-CM | POA: Diagnosis not present

## 2020-07-31 DIAGNOSIS — I130 Hypertensive heart and chronic kidney disease with heart failure and stage 1 through stage 4 chronic kidney disease, or unspecified chronic kidney disease: Secondary | ICD-10-CM | POA: Diagnosis not present

## 2020-07-31 DIAGNOSIS — I2510 Atherosclerotic heart disease of native coronary artery without angina pectoris: Secondary | ICD-10-CM | POA: Diagnosis not present

## 2020-08-01 DIAGNOSIS — E1151 Type 2 diabetes mellitus with diabetic peripheral angiopathy without gangrene: Secondary | ICD-10-CM | POA: Diagnosis not present

## 2020-08-01 DIAGNOSIS — I130 Hypertensive heart and chronic kidney disease with heart failure and stage 1 through stage 4 chronic kidney disease, or unspecified chronic kidney disease: Secondary | ICD-10-CM | POA: Diagnosis not present

## 2020-08-01 DIAGNOSIS — I5033 Acute on chronic diastolic (congestive) heart failure: Secondary | ICD-10-CM | POA: Diagnosis not present

## 2020-08-01 DIAGNOSIS — N183 Chronic kidney disease, stage 3 unspecified: Secondary | ICD-10-CM | POA: Diagnosis not present

## 2020-08-01 DIAGNOSIS — E1122 Type 2 diabetes mellitus with diabetic chronic kidney disease: Secondary | ICD-10-CM | POA: Diagnosis not present

## 2020-08-01 DIAGNOSIS — I2510 Atherosclerotic heart disease of native coronary artery without angina pectoris: Secondary | ICD-10-CM | POA: Diagnosis not present

## 2020-08-02 DIAGNOSIS — E1122 Type 2 diabetes mellitus with diabetic chronic kidney disease: Secondary | ICD-10-CM | POA: Diagnosis not present

## 2020-08-02 DIAGNOSIS — I2510 Atherosclerotic heart disease of native coronary artery without angina pectoris: Secondary | ICD-10-CM | POA: Diagnosis not present

## 2020-08-02 DIAGNOSIS — I5033 Acute on chronic diastolic (congestive) heart failure: Secondary | ICD-10-CM | POA: Diagnosis not present

## 2020-08-02 DIAGNOSIS — N183 Chronic kidney disease, stage 3 unspecified: Secondary | ICD-10-CM | POA: Diagnosis not present

## 2020-08-02 DIAGNOSIS — E1151 Type 2 diabetes mellitus with diabetic peripheral angiopathy without gangrene: Secondary | ICD-10-CM | POA: Diagnosis not present

## 2020-08-02 DIAGNOSIS — I130 Hypertensive heart and chronic kidney disease with heart failure and stage 1 through stage 4 chronic kidney disease, or unspecified chronic kidney disease: Secondary | ICD-10-CM | POA: Diagnosis not present

## 2020-08-06 DIAGNOSIS — E1151 Type 2 diabetes mellitus with diabetic peripheral angiopathy without gangrene: Secondary | ICD-10-CM | POA: Diagnosis not present

## 2020-08-06 DIAGNOSIS — I5033 Acute on chronic diastolic (congestive) heart failure: Secondary | ICD-10-CM | POA: Diagnosis not present

## 2020-08-06 DIAGNOSIS — N183 Chronic kidney disease, stage 3 unspecified: Secondary | ICD-10-CM | POA: Diagnosis not present

## 2020-08-06 DIAGNOSIS — I130 Hypertensive heart and chronic kidney disease with heart failure and stage 1 through stage 4 chronic kidney disease, or unspecified chronic kidney disease: Secondary | ICD-10-CM | POA: Diagnosis not present

## 2020-08-06 DIAGNOSIS — I2510 Atherosclerotic heart disease of native coronary artery without angina pectoris: Secondary | ICD-10-CM | POA: Diagnosis not present

## 2020-08-06 DIAGNOSIS — E1122 Type 2 diabetes mellitus with diabetic chronic kidney disease: Secondary | ICD-10-CM | POA: Diagnosis not present

## 2020-08-07 DIAGNOSIS — I2510 Atherosclerotic heart disease of native coronary artery without angina pectoris: Secondary | ICD-10-CM | POA: Diagnosis not present

## 2020-08-07 DIAGNOSIS — E1151 Type 2 diabetes mellitus with diabetic peripheral angiopathy without gangrene: Secondary | ICD-10-CM | POA: Diagnosis not present

## 2020-08-07 DIAGNOSIS — E1122 Type 2 diabetes mellitus with diabetic chronic kidney disease: Secondary | ICD-10-CM | POA: Diagnosis not present

## 2020-08-07 DIAGNOSIS — N183 Chronic kidney disease, stage 3 unspecified: Secondary | ICD-10-CM | POA: Diagnosis not present

## 2020-08-07 DIAGNOSIS — I130 Hypertensive heart and chronic kidney disease with heart failure and stage 1 through stage 4 chronic kidney disease, or unspecified chronic kidney disease: Secondary | ICD-10-CM | POA: Diagnosis not present

## 2020-08-07 DIAGNOSIS — I5033 Acute on chronic diastolic (congestive) heart failure: Secondary | ICD-10-CM | POA: Diagnosis not present

## 2020-08-08 ENCOUNTER — Inpatient Hospital Stay (HOSPITAL_COMMUNITY)
Admission: EM | Admit: 2020-08-08 | Discharge: 2020-08-14 | DRG: 291 | Disposition: A | Discharge status: Home Health | Payer: Medicare Other | Attending: Internal Medicine | Admitting: Internal Medicine

## 2020-08-08 ENCOUNTER — Emergency Department (HOSPITAL_COMMUNITY): Payer: Medicare Other

## 2020-08-08 ENCOUNTER — Encounter (HOSPITAL_COMMUNITY): Payer: Self-pay

## 2020-08-08 DIAGNOSIS — Z79890 Hormone replacement therapy (postmenopausal): Secondary | ICD-10-CM

## 2020-08-08 DIAGNOSIS — N1832 Chronic kidney disease, stage 3b: Secondary | ICD-10-CM | POA: Diagnosis present

## 2020-08-08 DIAGNOSIS — G4733 Obstructive sleep apnea (adult) (pediatric): Secondary | ICD-10-CM | POA: Diagnosis present

## 2020-08-08 DIAGNOSIS — R600 Localized edema: Secondary | ICD-10-CM | POA: Diagnosis not present

## 2020-08-08 DIAGNOSIS — N183 Chronic kidney disease, stage 3 unspecified: Secondary | ICD-10-CM | POA: Diagnosis not present

## 2020-08-08 DIAGNOSIS — Z20822 Contact with and (suspected) exposure to covid-19: Secondary | ICD-10-CM | POA: Diagnosis present

## 2020-08-08 DIAGNOSIS — J811 Chronic pulmonary edema: Secondary | ICD-10-CM | POA: Diagnosis not present

## 2020-08-08 DIAGNOSIS — E1122 Type 2 diabetes mellitus with diabetic chronic kidney disease: Secondary | ICD-10-CM | POA: Diagnosis not present

## 2020-08-08 DIAGNOSIS — J439 Emphysema, unspecified: Secondary | ICD-10-CM | POA: Diagnosis not present

## 2020-08-08 DIAGNOSIS — E11649 Type 2 diabetes mellitus with hypoglycemia without coma: Secondary | ICD-10-CM | POA: Diagnosis not present

## 2020-08-08 DIAGNOSIS — Z79899 Other long term (current) drug therapy: Secondary | ICD-10-CM

## 2020-08-08 DIAGNOSIS — Z951 Presence of aortocoronary bypass graft: Secondary | ICD-10-CM

## 2020-08-08 DIAGNOSIS — Z905 Acquired absence of kidney: Secondary | ICD-10-CM

## 2020-08-08 DIAGNOSIS — Z9111 Patient's noncompliance with dietary regimen: Secondary | ICD-10-CM

## 2020-08-08 DIAGNOSIS — L97919 Non-pressure chronic ulcer of unspecified part of right lower leg with unspecified severity: Secondary | ICD-10-CM | POA: Diagnosis present

## 2020-08-08 DIAGNOSIS — I509 Heart failure, unspecified: Secondary | ICD-10-CM

## 2020-08-08 DIAGNOSIS — I272 Pulmonary hypertension, unspecified: Secondary | ICD-10-CM | POA: Diagnosis present

## 2020-08-08 DIAGNOSIS — J449 Chronic obstructive pulmonary disease, unspecified: Secondary | ICD-10-CM | POA: Diagnosis present

## 2020-08-08 DIAGNOSIS — I493 Ventricular premature depolarization: Secondary | ICD-10-CM | POA: Diagnosis present

## 2020-08-08 DIAGNOSIS — I872 Venous insufficiency (chronic) (peripheral): Secondary | ICD-10-CM | POA: Diagnosis present

## 2020-08-08 DIAGNOSIS — E039 Hypothyroidism, unspecified: Secondary | ICD-10-CM | POA: Diagnosis present

## 2020-08-08 DIAGNOSIS — I248 Other forms of acute ischemic heart disease: Secondary | ICD-10-CM | POA: Diagnosis present

## 2020-08-08 DIAGNOSIS — I2510 Atherosclerotic heart disease of native coronary artery without angina pectoris: Secondary | ICD-10-CM | POA: Diagnosis not present

## 2020-08-08 DIAGNOSIS — I517 Cardiomegaly: Secondary | ICD-10-CM | POA: Diagnosis not present

## 2020-08-08 DIAGNOSIS — I130 Hypertensive heart and chronic kidney disease with heart failure and stage 1 through stage 4 chronic kidney disease, or unspecified chronic kidney disease: Principal | ICD-10-CM | POA: Diagnosis present

## 2020-08-08 DIAGNOSIS — I252 Old myocardial infarction: Secondary | ICD-10-CM

## 2020-08-08 DIAGNOSIS — I10 Essential (primary) hypertension: Secondary | ICD-10-CM | POA: Diagnosis not present

## 2020-08-08 DIAGNOSIS — J90 Pleural effusion, not elsewhere classified: Secondary | ICD-10-CM | POA: Diagnosis not present

## 2020-08-08 DIAGNOSIS — Z7982 Long term (current) use of aspirin: Secondary | ICD-10-CM

## 2020-08-08 DIAGNOSIS — R609 Edema, unspecified: Secondary | ICD-10-CM | POA: Diagnosis not present

## 2020-08-08 DIAGNOSIS — E1151 Type 2 diabetes mellitus with diabetic peripheral angiopathy without gangrene: Secondary | ICD-10-CM | POA: Diagnosis not present

## 2020-08-08 DIAGNOSIS — N189 Chronic kidney disease, unspecified: Secondary | ICD-10-CM

## 2020-08-08 DIAGNOSIS — E785 Hyperlipidemia, unspecified: Secondary | ICD-10-CM | POA: Diagnosis present

## 2020-08-08 DIAGNOSIS — I5033 Acute on chronic diastolic (congestive) heart failure: Secondary | ICD-10-CM | POA: Diagnosis present

## 2020-08-08 DIAGNOSIS — R001 Bradycardia, unspecified: Secondary | ICD-10-CM | POA: Diagnosis not present

## 2020-08-08 DIAGNOSIS — Z87891 Personal history of nicotine dependence: Secondary | ICD-10-CM

## 2020-08-08 DIAGNOSIS — R0602 Shortness of breath: Secondary | ICD-10-CM | POA: Diagnosis not present

## 2020-08-08 DIAGNOSIS — N179 Acute kidney failure, unspecified: Secondary | ICD-10-CM | POA: Diagnosis not present

## 2020-08-08 DIAGNOSIS — Z794 Long term (current) use of insulin: Secondary | ICD-10-CM

## 2020-08-08 DIAGNOSIS — R0902 Hypoxemia: Secondary | ICD-10-CM | POA: Diagnosis not present

## 2020-08-08 DIAGNOSIS — E11622 Type 2 diabetes mellitus with other skin ulcer: Secondary | ICD-10-CM | POA: Diagnosis present

## 2020-08-08 LAB — CBC WITH DIFFERENTIAL/PLATELET
Abs Immature Granulocytes: 0.03 10*3/uL (ref 0.00–0.07)
Basophils Absolute: 0.1 10*3/uL (ref 0.0–0.1)
Basophils Relative: 1 %
Eosinophils Absolute: 0.1 10*3/uL (ref 0.0–0.5)
Eosinophils Relative: 1 %
HCT: 41.1 % (ref 39.0–52.0)
Hemoglobin: 13 g/dL (ref 13.0–17.0)
Immature Granulocytes: 0 %
Lymphocytes Relative: 10 %
Lymphs Abs: 0.7 10*3/uL (ref 0.7–4.0)
MCH: 28 pg (ref 26.0–34.0)
MCHC: 31.6 g/dL (ref 30.0–36.0)
MCV: 88.4 fL (ref 80.0–100.0)
Monocytes Absolute: 0.7 10*3/uL (ref 0.1–1.0)
Monocytes Relative: 10 %
Neutro Abs: 5.6 10*3/uL (ref 1.7–7.7)
Neutrophils Relative %: 78 %
Platelets: 195 10*3/uL (ref 150–400)
RBC: 4.65 MIL/uL (ref 4.22–5.81)
RDW: 17 % — ABNORMAL HIGH (ref 11.5–15.5)
WBC: 7.2 10*3/uL (ref 4.0–10.5)
nRBC: 0 % (ref 0.0–0.2)

## 2020-08-08 LAB — BASIC METABOLIC PANEL
Anion gap: 9 (ref 5–15)
BUN: 30 mg/dL — ABNORMAL HIGH (ref 8–23)
CO2: 21 mmol/L — ABNORMAL LOW (ref 22–32)
Calcium: 8.5 mg/dL — ABNORMAL LOW (ref 8.9–10.3)
Chloride: 109 mmol/L (ref 98–111)
Creatinine, Ser: 2.16 mg/dL — ABNORMAL HIGH (ref 0.61–1.24)
GFR calc Af Amer: 31 mL/min — ABNORMAL LOW (ref >60)
GFR calc non Af Amer: 27 mL/min — ABNORMAL LOW (ref >60)
Glucose, Bld: 96 mg/dL (ref 70–99)
Potassium: 5.1 mmol/L (ref 3.5–5.1)
Sodium: 139 mmol/L (ref 135–145)

## 2020-08-08 LAB — TROPONIN I (HIGH SENSITIVITY)
Troponin I (High Sensitivity): 40 ng/L — ABNORMAL HIGH (ref <18)
Troponin I (High Sensitivity): 44 ng/L — ABNORMAL HIGH (ref <18)

## 2020-08-08 LAB — BRAIN NATRIURETIC PEPTIDE: B Natriuretic Peptide: 1826.3 pg/mL — ABNORMAL HIGH (ref 0.0–100.0)

## 2020-08-08 MED ORDER — ENOXAPARIN SODIUM 30 MG/0.3ML ~~LOC~~ SOLN
30.0000 mg | Freq: Every day | SUBCUTANEOUS | Status: DC
  Administered 2020-08-09 – 2020-08-13 (×6): 30 mg via SUBCUTANEOUS
  Filled 2020-08-08 (×6): qty 0.3

## 2020-08-08 NOTE — ED Provider Notes (Signed)
[facility] EMERGENCY DEPARTMENT Provider Note   CSN: [id number] Arrival date & time: 08/08/20  1853     History Chief Complaint  Patient presents with  . Shortness of Breath  . Leg Swelling    [name] is a 86 y.o. male with a past medical history of hypertension, diabetes, HFpEF (60-65% seen on echo last month), COPD, pulmonary hypertension, on 2 L of oxygen via nasal cannula at baseline presenting to the ED with a chief complaint of leg swelling and shortness of breath.  Patient admitted to the hospital from 07/20/2020 to 07/24/2020 for CHF exacerbation.  He was placed on 80 mg of Lasix twice daily at discharge.  States that he did feel better when he was discharged home.  About 3 to 4 days ago again felt like he was having leg swelling and shortness of breath despite taking his Lasix.  States that he weighed 170 pounds at discharge and feels like he gained 7 to 8 pounds since then.  Reports intermittent chest pain but denies any current chest pain.  Denies any cough, fever, abdominal pain, nausea, vomiting or diarrhea.  Does report a decreased appetite.  Denies any salt intake.  Feels like his abdomen is distended as well.  HPI     Past Medical History:  Diagnosis Date  . Chronic kidney disease    tumor right kidney  . Coronary artery disease   . Diabetes mellitus    type 2  . Hypertension   . Myocardial infarction ([place])    1997  . Right renal mass     Patient Active Problem List   Diagnosis Date Noted  . Diabetes ([place]) 07/21/2020  . Acute heart failure with preserved ejection fraction (HFpEF) ([place]) 07/20/2020  . OSA (obstructive sleep apnea) 05/15/2019  . Acute on chronic diastolic heart failure ([place]) 05/06/2018  . Pleural effusion 05/06/2018  . COPD (chronic obstructive pulmonary disease) with emphysema ([place]) 04/29/2017  . Lung nodule 04/29/2017  . Hx of CABG 04/08/2017  . Chronic respiratory failure with hypoxia ([place]) 03/23/2017  . Pulmonary  hypertension ([place]) 03/22/2017  . Shortness of breath 03/22/2017  . Pericardial effusion 03/22/2017  . Chronic diastolic heart failure ([place]) 02/25/2017  . Acute postoperative respiratory failure ([place]) 02/19/2017  . Chronic cholecystitis with calculus s/p robotic cholecystectomy 02/19/2017 02/19/2017  . Acute kidney injury superimposed on chronic kidney disease ([place]) 11/18/2016  . Nausea vomiting and diarrhea 11/18/2016  . Renal mass s/p right partial nephrectomy 02/19/2017 11/18/2016  . Gastroenteritis 11/18/2016  . Elevated transaminase level 11/18/2016  . Dyslipidemia 05/12/2013  . Syncope 01/14/2012  . Hypoglycemia 01/14/2012  . Stage 3 chronic kidney disease 01/14/2012  . Essential hypertension 01/14/2012    Past Surgical History:  Procedure Laterality Date  . CARDIAC CATHETERIZATION  1996  . CORONARY ARTERY BYPASS GRAFT    . DOPPLER ECHOCARDIOGRAPHY  2011  . HERNIA REPAIR    . IR GENERIC HISTORICAL  12/09/2016   IR RADIOLOGIST EVAL & MGMT 12/09/2016 [name], MD [facility] INTERV RAD  . ROBOTIC ASSITED PARTIAL NEPHRECTOMY Right 02/19/2017   Procedure: XI ROBOTIC ASSITED PARTIAL NEPHRECTOMY;  Surgeon: [name], MD;  Location: [facility];  Service: Urology;  Laterality: Right;       Family History  Problem Relation Age of Onset  . Diabetes Mother   . Diabetes Sister     Social History   Tobacco Use  . Smoking status: Former Smoker    Packs/day: 0.30  Years: 45.00    Pack years: 13.50    Types: Cigarettes    Quit date: 02/15/1997    Years since quitting: 23.4  . Smokeless tobacco: Never Used  Vaping Use  . Vaping Use: Never used  Substance Use Topics  . Alcohol use: No  . Drug use: No    Home Medications Prior to Admission medications   Medication Sig Start Date End Date Taking? Authorizing Provider  acetaminophen (TYLENOL) 325 MG tablet Take 650 mg by mouth every 6 (six) hours as needed (for pain.).    [provider]  amLODipine (NORVASC) 10 MG  tablet Take 1 tablet (10 mg total) by mouth daily. 07/24/20 08/23/20  Lacinda Axon, MD  aspirin 81 MG EC tablet Take 81 mg by mouth daily. Swallow whole.    [provider]  benazepril (LOTENSIN) 40 MG tablet Take 40 mg by mouth daily. 01/14/17   [provider]  cloNIDine (CATAPRES) 0.1 MG tablet Take 0.1 mg by mouth 2 (two) times daily.    [provider]  ezetimibe (ZETIA) 10 MG tablet Take 10 mg by mouth daily.    [provider]  furosemide (LASIX) 20 MG tablet Take 4 tablets (80 mg total) by mouth 2 (two) times daily. 07/24/20 08/23/20  Lacinda Axon, MD  HUMALOG KWIKPEN 100 UNIT/ML KiwkPen Inject 6-8 Units into the skin 3 (three) times daily.  07/25/15   [provider]  Icosapent Ethyl (VASCEPA) 1 g CAPS Take 2 capsules (2 g total) by mouth 2 (two) times daily. 09/05/19   Hilty, Nadean Corwin, MD  LANTUS SOLOSTAR 100 UNIT/ML Solostar Pen Inject 18 Units into the skin every evening.  02/23/19   [provider]  nadolol (CORGARD) 20 MG tablet Take 1 tablet (20 mg total) by mouth daily. 07/24/20 08/23/20  Lacinda Axon, MD  potassium chloride (KLOR-CON) 10 MEQ tablet Take 2 tablets (20 mEq total) by mouth 2 (two) times daily. 07/24/20 08/23/20  Lacinda Axon, MD  rosuvastatin (CRESTOR) 20 MG tablet TAKE 1 TABLET BY MOUTH EVERY DAY Patient taking differently: Take 20 mg by mouth daily.  02/09/20   Hilty, Nadean Corwin, MD  SYNTHROID 100 MCG tablet Take 100 mcg by mouth daily.  10/17/15   [provider]    Allergies    Patient has no known allergies.  Review of Systems   Review of Systems  Constitutional: Negative for appetite change, chills and fever.  HENT: Negative for ear pain, rhinorrhea, sneezing and sore throat.   Eyes: Negative for photophobia and visual disturbance.  Respiratory: Positive for shortness of breath. Negative for cough, chest tightness and wheezing.   Cardiovascular: Positive for chest pain and leg  swelling. Negative for palpitations.  Gastrointestinal: Positive for abdominal distention. Negative for abdominal pain, blood in stool, constipation, diarrhea, nausea and vomiting.  Genitourinary: Negative for dysuria, hematuria and urgency.  Musculoskeletal: Negative for myalgias.  Skin: Negative for rash.  Neurological: Negative for dizziness, weakness and light-headedness.    Physical Exam Updated Vital Signs BP (!) 147/75   Pulse (!) 46   Temp 98.7 F (37.1 C) (Oral)   Resp 16   SpO2 100%   Physical Exam Vitals and nursing note reviewed.  Constitutional:      General: He is not in acute distress.    Appearance: He is well-developed.  HENT:     Head: Normocephalic and atraumatic.     Nose: Nose normal.  Eyes:     General:  No scleral icterus.       Left eye: No discharge.     Conjunctiva/sclera: Conjunctivae normal.  Cardiovascular:     Rate and Rhythm: Normal rate and regular rhythm.     Heart sounds: Normal heart sounds. No murmur heard.  No friction rub. No gallop.   Pulmonary:     Effort: Pulmonary effort is normal. No respiratory distress.     Breath sounds: Normal breath sounds.     Comments: Faint crackles at lung bases bilaterally. Abdominal:     General: Bowel sounds are normal. There is no distension.     Palpations: Abdomen is soft.     Tenderness: There is no abdominal tenderness. There is no guarding.  Musculoskeletal:        General: Normal range of motion.     Cervical back: Normal range of motion and neck supple.     Right lower leg: Edema present.     Left lower leg: Edema present.     Comments: 2+ pitting edema to bilateral lower extremities.  Skin:    General: Skin is warm and dry.     Findings: No rash.  Neurological:     Mental Status: He is alert.     Motor: No abnormal muscle tone.     Coordination: Coordination normal.     ED Results / Procedures / Treatments   Labs (all labs ordered are listed, but only abnormal results are  displayed) Labs Reviewed  BASIC METABOLIC PANEL - Abnormal; Notable for the following components:      Result Value   CO2 21 (*)    BUN 30 (*)    Creatinine, Ser 2.16 (*)    Calcium 8.5 (*)    GFR calc non Af Amer 27 (*)    GFR calc Af Amer 31 (*)    All other components within normal limits  TROPONIN I (HIGH SENSITIVITY) - Abnormal; Notable for the following components:   Troponin I (High Sensitivity) 40 (*)    All other components within normal limits  CBC WITH DIFFERENTIAL/PLATELET  BRAIN NATRIURETIC PEPTIDE  TROPONIN I (HIGH SENSITIVITY)    EKG EKG Interpretation  Date/Time:  Thursday August 08 2020 19:28:15 EDT Ventricular Rate:  46 PR Interval:    QRS Duration: 112 QT Interval:  494 QTC Calculation: 433 R Axis:   73 Text Interpretation: Sinus bradycardia Inferior infarct, age indeterminate Confirmed by Gerlene Fee [phone]) on 08/08/2020 7:36:06 PM   Radiology DG Chest Portable 1 View  Result Date: 08/08/2020 CLINICAL DATA:  Shortness of breath EXAM: PORTABLE CHEST 1 VIEW COMPARISON:  07/19/2020 04/21/2020 FINDINGS: Post sternotomy changes. Underlying emphysematous disease. Cardiomegaly with vascular congestion, interstitial pulmonary edema and moderate left and small right pleural effusions. Left basilar consolidation. No pneumothorax. IMPRESSION: Cardiomegaly with vascular congestion, interstitial pulmonary edema and moderate left and small right pleural effusions. Not much significant change since 07/19/2020 aside from slight progression of consolidation in the left lower lung. Electronically Signed   By: Donavan Foil M.D.   On: 08/08/2020 19:28    Procedures Procedures (including critical care time)  Medications Ordered in ED Medications - No data to display  ED Course  I have reviewed the triage vital signs and the nursing notes.  Pertinent labs & imaging results that were available during my care of the patient were reviewed by me and considered in my  medical decision making (see chart for details).  Clinical Course as of Aug 08 2152  Thu Aug 08, 2020  2108 Spoke to cardiologist who reviewed patient's EKG. States that he has had sinus bradycardia on his prior EKGs as well. No further cardiology recommendations, feels he will need to be admitted to prior team for continued diuresis.   [HK]    Clinical Course User Index [HK] Delia Heady, PA-C   MDM Rules/Calculators/A&P                          86 year old male with past medical history of hypertension, diabetes, heart failure with preserved ejection fraction of 60 to 65% seen on echo last month, COPD on 2 L of oxygen via nasal cannula at baseline presenting to the ED with a chief complaint of leg swelling and shortness of breath.  Admitted to the hospital on 07/20/2020 until 07/24/2020 for CHF exacerbation.  He was placed on rams of Lasix twice daily which he has been taking.  He felt better when he left the hospital but then 3 to 4 days ago started feeling like the fluid was accumulating in his legs again.  Also reports shortness of breath similar to prior.  He believes he gained about 7 to 8 pounds since discharge.  On exam patient continues to be on his 2 L of oxygen via nasal cannula.  He does have 2-3+ pitting edema in bilateral lower extremities.  He has some crackles at his lung bases as well.  EKG shows sinus bradycardia which has been seen in prior EKGs as well.  Creatinine of 2.1 which is higher than his baseline.  Troponin of 48 similar to prior elevations.  Chest x-ray shows cardiomegaly with vascular congestion essentially unchanged since his admission 2 weeks ago. Anticipate admission for AKI at the very least.  He also appears fluid overloaded on exam.  Handed off to oncoming provider pending remainder of work-up and admission. Patient discussed with and seen by the attending, Dr. Sedonia Small.   Portions of this note were generated with Lobbyist. Dictation errors may occur  despite best attempts at proofreading.  Final Clinical Impression(s) / ED Diagnoses Final diagnoses:  Peripheral edema  AKI (acute kidney injury) [facility])    Rx / [place] Orders ED Discharge Orders    None       Delia Heady, PA-C 08/08/20 2153    Maudie Flakes, MD 08/09/20 [phone]

## 2020-08-08 NOTE — ED Triage Notes (Signed)
Per EMS pt reports SOB despite using home O2 at 2L. EMS placed on 3L . sats between 94-98% en route. Pt also reports lower abdominal and bilateral leg swelling since Mon. Pt reports a weight gain of 10lbs. Hx of CHF.

## 2020-08-08 NOTE — ED Notes (Signed)
Please call Keian Odriscoll (pt wife) at  [phone] for an update

## 2020-08-08 NOTE — H&P (Signed)
Date: 08/09/2020               Patient Name:  [name] MRN: [id number]  DOB: 12-01-1932 Age / Sex: 86 y.o., male   PCP: [name], MD         Medical Service: Internal Medicine Teaching Service         Attending Physician: Dr. Sid Falcon, MD    First Contact: Dr. Candie Chroman Pager: [phone]  Second Contact: Dr. Marianna Payment Pager: [phone]       After Hours (After 5p/  First Contact Pager: [phone]  weekends / holidays): Second Contact Pager: [phone]   Chief Complaint: Dyspnea and Leg Swelling  History of Present Illness: Mr. Hero Kulish is an 86 year old male with PMHx of HFpEF (EF 60-65% with grade II diastolic dysfunction 01/5426), hypertension, CAD s/p CABG (1996), type II diabetes mellitus, and CKD 3b presenting with dyspnea and worsening leg swelling. He endorses that he has been having severe dyspnea with exertion which has been present over the past several months but acutely worsened this morning. He notes worsening bilateral lower extremity swelling. He also states that baseline dry weight is 170lbs but was told that he weighed 197lbs this morning by home nurse. He denies having any abdominal swelling. He denies any paroxysmal nocturnal dyspnea but does endorse orthopnea. Patient has ace bandage wrap on right leg, notes the area is kept wrapped by wound care.  He endorses chills but denies any fevers, chest pain, cough, palpitations, nausea/vomiting, headaches. He endorses feeling dizzy this morning that has since improved. No episodes of syncope. No recent illness.   Of note, patient recently admitted 8/13-8/18 with similar symptoms and noted to have acute heart failure exacerbation. At the time, he was discharged with increased lasix dose. He notes that this was working well for him initially and also notes that he has not been urinating as frequently as he has in the past  Meds:  No current facility-administered medications on file prior to encounter.   Current Outpatient  Medications on File Prior to Encounter  Medication Sig Dispense Refill  . acetaminophen (TYLENOL) 325 MG tablet Take 650 mg by mouth every 6 (six) hours as needed (for pain.).    Marland Kitchen amLODipine (NORVASC) 10 MG tablet Take 1 tablet (10 mg total) by mouth daily. 30 tablet 2  . aspirin 81 MG EC tablet Take 81 mg by mouth daily. Swallow whole.    . benazepril (LOTENSIN) 40 MG tablet Take 40 mg by mouth daily.  0  . cloNIDine (CATAPRES) 0.1 MG tablet Take 0.1 mg by mouth 2 (two) times daily.    Marland Kitchen ezetimibe (ZETIA) 10 MG tablet Take 10 mg by mouth daily.    . furosemide (LASIX) 20 MG tablet Take 4 tablets (80 mg total) by mouth 2 (two) times daily. 240 tablet 0  . HUMALOG KWIKPEN 100 UNIT/ML KiwkPen Inject 6-8 Units into the skin 3 (three) times daily.   0  . Icosapent Ethyl (VASCEPA) 1 g CAPS Take 2 capsules (2 g total) by mouth 2 (two) times daily. 120 capsule 11  . LANTUS SOLOSTAR 100 UNIT/ML Solostar Pen Inject 18 Units into the skin every evening.     . nadolol (CORGARD) 20 MG tablet Take 1 tablet (20 mg total) by mouth daily. 30 tablet 0  . potassium chloride (KLOR-CON) 10 MEQ tablet Take 2 tablets (20 mEq total) by mouth 2 (two) times daily. 120 tablet 0  . rosuvastatin (CRESTOR)  20 MG tablet TAKE 1 TABLET BY MOUTH EVERY DAY (Patient taking differently: Take 20 mg by mouth daily. ) 90 tablet 1  . SYNTHROID 100 MCG tablet Take 100 mcg by mouth daily.   0    Allergies: Allergies as of 08/08/2020  . (No Known Allergies)   Past Medical History:  Diagnosis Date  . Chronic kidney disease    tumor right kidney  . Coronary artery disease   . Diabetes mellitus    type 2  . Hypertension   . Myocardial infarction ([place])    1997  . Right renal mass     Family History: Family History  Problem Relation Age of Onset  . Diabetes Mother   . Diabetes Sister    Social History:  Social History   Tobacco Use  . Smoking status: Former Smoker    Packs/day: 0.30    Years: 45.00    Pack years:  13.50    Types: Cigarettes    Quit date: 02/15/1997    Years since quitting: 23.4  . Smokeless tobacco: Never Used  Vaping Use  . Vaping Use: Never used  Substance Use Topics  . Alcohol use: No  . Drug use: No    Review of Systems: A complete ROS was negative except as per HPI.  Physical Exam: Blood pressure (!) 187/98, pulse (!) 49, temperature 98.7 F (37.1 C), temperature source Oral, resp. rate (!) 24, SpO2 100 %. Physical Exam Vitals and nursing note reviewed.  Constitutional:      General: He is not in acute distress.    Appearance: He is well-developed. He is not ill-appearing or toxic-appearing.     Comments: On O2 nasal cannula.   HENT:     Head: Normocephalic and atraumatic.  Eyes:     Extraocular Movements: Extraocular movements intact.  Neck:     Vascular: JVD present.  Cardiovascular:     Rate and Rhythm: Regular rhythm. Bradycardia present.     Heart sounds: No murmur heard.  No friction rub. No gallop.   Pulmonary:     Effort: No accessory muscle usage.     Breath sounds: Wheezing (expiratory) present. No decreased breath sounds.  Abdominal:     Palpations: Abdomen is soft.     Tenderness: There is no abdominal tenderness. There is no guarding or rebound.  Musculoskeletal:     Right lower leg: No tenderness. Edema (2+) present.     Left lower leg: No tenderness. Edema (2+) present.     Comments: Right leg wrapped with ace bandage  Skin:    General: Skin is warm and dry.  Neurological:     General: No focal deficit present.     Mental Status: He is alert and oriented to person, place, and time.  Psychiatric:        Mood and Affect: Mood normal.        Behavior: Behavior normal.    EKG: personally reviewed my interpretation is rate of 46, sinus bradycardia. Normal Axis. Normal PR and QT Interval. No ST segment abnormalities. q waves present inferior leads. Compared to EKG on 07/22/20, bradycardia and q waves present at that time as well.    CXR:  personally reviewed my interpretation is significant cardiomegaly with pulmonary edema and slight worsening of left lower lobe opacification in comparison to prior CXR 07/2020   Assessment & Plan by Problem: Active Problems:   Acute exacerbation of CHF (congestive heart failure) ([place])   Jaque Dacy is an 86  y/o M with a PMHx of CHF, CKD, T2DM, CAD with MI, and HTN who presents to the ED with dyspnea and lower extremity swelling for the past day. Patient was recently admitted on 07/19/20 and discharged 5 days later for acute exacerbation of CHF. His lasix was increased to 80 mg BID, his amlodipine was increased and his nadolol was decreased. On physical exam patient was found to have JVD, was bradycardic, and had bilateral 2+ lower extremity edema. Of significance, his BMP revealed creatinine of 2.16 and BUN of 30. Patient to be admitted due to his fluid overload and acute kidney injury in the setting of CKD.  #Acute Exacerbation of Chronic Diastolic Heart Failure - History of diastolic HF. Last echo 07/20/20 EF 60-65% - Presents with lower extremity swelling and dyspnea. Recently discharged for CHF exacerbation, lasix increased to 80 mg BID.  - Exam revealed JVD, 2+bilateral pitting edema - X-ray revealed cardiomegaly with pulmonary edema - Creatinine of 2.16, increased from 1.62 07/24/20 - Furosemide 120 mg IV IVPB ordered. Will give one dose and reassess creatinine in the am. If kidney function not worsening, will continue furosemide - If patient shows no improvement with furosemide, will consider starting metolazone.  - Suspected etiology of acute exacerbation medication non-compliance, AKI. Low suspicion for ischemic etiology causing exacerbation, as patient's troponin's are at baseline and no acute EKG changes seen. - Strict I/O's - Daily weights - Daily BMP's, consider daily mg's. - Diet with fluid restriction ordered - PT/OT ordered due to patient's decompensation  #Acute Kidney Injury  with CKD - Patient with PMHx of CKD stage III - Cr of 2.16 from 1.62 during last admission 07/24/20 - Suspected etiology ischemic from CHF, furosemide causing AKI. Low suspicion for progression of CKD - Daily BMP's. If worsening Cr will hold furosemide  #Wheezing - Patient wheezing on exam, denies history of COPD, Asthma - Duoneb 46mL Q4H PRN ordered  #History of CAD - CABG in 1996 - Troponin I of 40 and 44. Prior admission patient had elevated troponin as well - No EKG changes indicating signs of ischemia/infarction - Patient bradycardic, will hold nadolol 20 mg daily at this time. - Continue aspirin 81 mg daily  #History of Type 2 DM - A1C of 6.4, 2 weeks ago - Lantus 18 units daily at bedtime ordered along with novolog sliding scale.  - Hypoglycemia protocol in place, stat cbg if hypoglycemia suspected ordered  #History of HTN - BP of 170/87 while in the ED - Continue amlodipine 10 mg QD - Holding benazopril in setting of AKI.  - Do not suspect recent increase in amlodipine is contributing to patient's peripheral edema  #History of HLD - LDL of 52 on 07/21/20 - Continue rosuvastatin 20 mg tab daily and ezetimibe 10 mg daily.  #History of Hypothyroidism - TSH of 3.750 on 07/21/20 - Continue levothyroxine 100 mcg daily  #OSA - CPAP ordered  #Venous Stasis Dermatitis with Ulceration - Patient followed by wound care since prior admission - Area currently wrapped with ace bandage - Will continue to have wound care assess patient during hospitalization  Diet: Renal/Carb modified DVT Prophylaxis: Lovenox Code: Full Code Dispo: Admit patient to Observation with expected length of stay less than 2 midnights.  SignedRiesa Pope, MD 08/09/2020, 12:15 AM  After 5pm on weekdays and 1pm on weekends: On Call pager: [phone]

## 2020-08-09 ENCOUNTER — Inpatient Hospital Stay (HOSPITAL_COMMUNITY): Payer: Medicare Other

## 2020-08-09 ENCOUNTER — Other Ambulatory Visit: Payer: Self-pay | Admitting: Internal Medicine

## 2020-08-09 DIAGNOSIS — I248 Other forms of acute ischemic heart disease: Secondary | ICD-10-CM | POA: Diagnosis present

## 2020-08-09 DIAGNOSIS — Z7982 Long term (current) use of aspirin: Secondary | ICD-10-CM | POA: Diagnosis not present

## 2020-08-09 DIAGNOSIS — E039 Hypothyroidism, unspecified: Secondary | ICD-10-CM | POA: Diagnosis present

## 2020-08-09 DIAGNOSIS — Z794 Long term (current) use of insulin: Secondary | ICD-10-CM | POA: Diagnosis not present

## 2020-08-09 DIAGNOSIS — E11649 Type 2 diabetes mellitus with hypoglycemia without coma: Secondary | ICD-10-CM | POA: Diagnosis not present

## 2020-08-09 DIAGNOSIS — N179 Acute kidney failure, unspecified: Secondary | ICD-10-CM | POA: Diagnosis not present

## 2020-08-09 DIAGNOSIS — I872 Venous insufficiency (chronic) (peripheral): Secondary | ICD-10-CM | POA: Diagnosis present

## 2020-08-09 DIAGNOSIS — J449 Chronic obstructive pulmonary disease, unspecified: Secondary | ICD-10-CM | POA: Diagnosis present

## 2020-08-09 DIAGNOSIS — I252 Old myocardial infarction: Secondary | ICD-10-CM | POA: Diagnosis not present

## 2020-08-09 DIAGNOSIS — Z905 Acquired absence of kidney: Secondary | ICD-10-CM | POA: Diagnosis not present

## 2020-08-09 DIAGNOSIS — R600 Localized edema: Secondary | ICD-10-CM

## 2020-08-09 DIAGNOSIS — Z87891 Personal history of nicotine dependence: Secondary | ICD-10-CM | POA: Diagnosis not present

## 2020-08-09 DIAGNOSIS — E785 Hyperlipidemia, unspecified: Secondary | ICD-10-CM | POA: Diagnosis present

## 2020-08-09 DIAGNOSIS — I272 Pulmonary hypertension, unspecified: Secondary | ICD-10-CM | POA: Diagnosis present

## 2020-08-09 DIAGNOSIS — R609 Edema, unspecified: Secondary | ICD-10-CM | POA: Diagnosis not present

## 2020-08-09 DIAGNOSIS — I5033 Acute on chronic diastolic (congestive) heart failure: Secondary | ICD-10-CM

## 2020-08-09 DIAGNOSIS — I2510 Atherosclerotic heart disease of native coronary artery without angina pectoris: Secondary | ICD-10-CM | POA: Diagnosis present

## 2020-08-09 DIAGNOSIS — Z20822 Contact with and (suspected) exposure to covid-19: Secondary | ICD-10-CM | POA: Diagnosis present

## 2020-08-09 DIAGNOSIS — L97919 Non-pressure chronic ulcer of unspecified part of right lower leg with unspecified severity: Secondary | ICD-10-CM | POA: Diagnosis present

## 2020-08-09 DIAGNOSIS — Z9111 Patient's noncompliance with dietary regimen: Secondary | ICD-10-CM | POA: Diagnosis not present

## 2020-08-09 DIAGNOSIS — Z79899 Other long term (current) drug therapy: Secondary | ICD-10-CM | POA: Diagnosis not present

## 2020-08-09 DIAGNOSIS — I130 Hypertensive heart and chronic kidney disease with heart failure and stage 1 through stage 4 chronic kidney disease, or unspecified chronic kidney disease: Secondary | ICD-10-CM | POA: Diagnosis present

## 2020-08-09 DIAGNOSIS — Z951 Presence of aortocoronary bypass graft: Secondary | ICD-10-CM | POA: Diagnosis not present

## 2020-08-09 DIAGNOSIS — Z79890 Hormone replacement therapy (postmenopausal): Secondary | ICD-10-CM | POA: Diagnosis not present

## 2020-08-09 DIAGNOSIS — G4733 Obstructive sleep apnea (adult) (pediatric): Secondary | ICD-10-CM | POA: Diagnosis present

## 2020-08-09 DIAGNOSIS — E1122 Type 2 diabetes mellitus with diabetic chronic kidney disease: Secondary | ICD-10-CM | POA: Diagnosis present

## 2020-08-09 LAB — URINALYSIS, ROUTINE W REFLEX MICROSCOPIC
Bacteria, UA: NONE SEEN
Bilirubin Urine: NEGATIVE
Glucose, UA: NEGATIVE mg/dL
Ketones, ur: NEGATIVE mg/dL
Leukocytes,Ua: NEGATIVE
Nitrite: NEGATIVE
Protein, ur: 100 mg/dL — AB
Specific Gravity, Urine: 1.01 (ref 1.005–1.030)
pH: 5 (ref 5.0–8.0)

## 2020-08-09 LAB — CBC
HCT: 47.7 % (ref 39.0–52.0)
Hemoglobin: 14.9 g/dL (ref 13.0–17.0)
MCH: 27.5 pg (ref 26.0–34.0)
MCHC: 31.2 g/dL (ref 30.0–36.0)
MCV: 88.2 fL (ref 80.0–100.0)
Platelets: 244 10*3/uL (ref 150–400)
RBC: 5.41 MIL/uL (ref 4.22–5.81)
RDW: 17 % — ABNORMAL HIGH (ref 11.5–15.5)
WBC: 10.9 10*3/uL — ABNORMAL HIGH (ref 4.0–10.5)
nRBC: 0 % (ref 0.0–0.2)

## 2020-08-09 LAB — GLUCOSE, CAPILLARY
Glucose-Capillary: 104 mg/dL — ABNORMAL HIGH (ref 70–99)
Glucose-Capillary: 138 mg/dL — ABNORMAL HIGH (ref 70–99)
Glucose-Capillary: 145 mg/dL — ABNORMAL HIGH (ref 70–99)
Glucose-Capillary: 47 mg/dL — ABNORMAL LOW (ref 70–99)
Glucose-Capillary: 115 mg/dL — ABNORMAL HIGH (ref 70–99)

## 2020-08-09 LAB — BASIC METABOLIC PANEL
Anion gap: 11 (ref 5–15)
BUN: 30 mg/dL — ABNORMAL HIGH (ref 8–23)
CO2: 23 mmol/L (ref 22–32)
Calcium: 9.4 mg/dL (ref 8.9–10.3)
Chloride: 109 mmol/L (ref 98–111)
Creatinine, Ser: 2.13 mg/dL — ABNORMAL HIGH (ref 0.61–1.24)
GFR calc Af Amer: 32 mL/min — ABNORMAL LOW (ref >60)
GFR calc non Af Amer: 27 mL/min — ABNORMAL LOW (ref >60)
Glucose, Bld: 106 mg/dL — ABNORMAL HIGH (ref 70–99)
Potassium: 4.6 mmol/L (ref 3.5–5.1)
Sodium: 143 mmol/L (ref 135–145)

## 2020-08-09 LAB — SODIUM, URINE, RANDOM: Sodium, Ur: 67 mmol/L

## 2020-08-09 LAB — PROTEIN / CREATININE RATIO, URINE
Creatinine, Urine: 71.61 mg/dL
Protein Creatinine Ratio: 1.05 mg/mg{Cre} — ABNORMAL HIGH (ref 0.00–0.15)
Total Protein, Urine: 75 mg/dL

## 2020-08-09 LAB — CREATININE, URINE, RANDOM: Creatinine, Urine: 76.13 mg/dL

## 2020-08-09 LAB — SARS CORONAVIRUS 2 BY RT PCR (HOSPITAL ORDER, PERFORMED IN ~~LOC~~ HOSPITAL LAB): SARS Coronavirus 2: NEGATIVE

## 2020-08-09 LAB — ALBUMIN: Albumin: 3.6 g/dL (ref 3.5–5.0)

## 2020-08-09 MED ORDER — NADOLOL 20 MG PO TABS
20.0000 mg | ORAL_TABLET | Freq: Every day | ORAL | Status: DC
  Administered 2020-08-09: 20 mg via ORAL
  Filled 2020-08-09 (×2): qty 1

## 2020-08-09 MED ORDER — IPRATROPIUM-ALBUTEROL 0.5-2.5 (3) MG/3ML IN SOLN
3.0000 mL | RESPIRATORY_TRACT | Status: DC | PRN
  Administered 2020-08-09 (×2): 3 mL via RESPIRATORY_TRACT
  Filled 2020-08-09 (×2): qty 3

## 2020-08-09 MED ORDER — LEVOTHYROXINE SODIUM 100 MCG PO TABS
100.0000 ug | ORAL_TABLET | Freq: Every day | ORAL | Status: DC
  Administered 2020-08-09 – 2020-08-14 (×6): 100 ug via ORAL
  Filled 2020-08-09 (×6): qty 1

## 2020-08-09 MED ORDER — ASPIRIN EC 81 MG PO TBEC
81.0000 mg | DELAYED_RELEASE_TABLET | Freq: Every day | ORAL | Status: DC
  Administered 2020-08-09 – 2020-08-14 (×6): 81 mg via ORAL
  Filled 2020-08-09 (×7): qty 1

## 2020-08-09 MED ORDER — FUROSEMIDE 10 MG/ML IJ SOLN
120.0000 mg | Freq: Two times a day (BID) | INTRAVENOUS | Status: DC
  Administered 2020-08-09 – 2020-08-14 (×10): 120 mg via INTRAVENOUS
  Filled 2020-08-09: qty 12
  Filled 2020-08-09 (×2): qty 10
  Filled 2020-08-09: qty 12
  Filled 2020-08-09: qty 10
  Filled 2020-08-09 (×2): qty 12
  Filled 2020-08-09 (×2): qty 10
  Filled 2020-08-09 (×2): qty 12
  Filled 2020-08-09: qty 10
  Filled 2020-08-09: qty 2

## 2020-08-09 MED ORDER — ROSUVASTATIN CALCIUM 20 MG PO TABS
20.0000 mg | ORAL_TABLET | Freq: Every day | ORAL | Status: DC
  Administered 2020-08-09 – 2020-08-13 (×5): 20 mg via ORAL
  Filled 2020-08-09 (×5): qty 1

## 2020-08-09 MED ORDER — EZETIMIBE 10 MG PO TABS
10.0000 mg | ORAL_TABLET | Freq: Every day | ORAL | Status: DC
  Administered 2020-08-09 – 2020-08-14 (×6): 10 mg via ORAL
  Filled 2020-08-09 (×7): qty 1

## 2020-08-09 MED ORDER — INSULIN GLARGINE 100 UNIT/ML ~~LOC~~ SOLN
18.0000 [IU] | Freq: Every day | SUBCUTANEOUS | Status: DC
  Administered 2020-08-09 (×2): 18 [IU] via SUBCUTANEOUS
  Filled 2020-08-09 (×3): qty 0.18

## 2020-08-09 MED ORDER — AMLODIPINE BESYLATE 10 MG PO TABS
10.0000 mg | ORAL_TABLET | Freq: Every day | ORAL | Status: DC
  Administered 2020-08-09 – 2020-08-14 (×6): 10 mg via ORAL
  Filled 2020-08-09 (×7): qty 1

## 2020-08-09 MED ORDER — FUROSEMIDE 10 MG/ML IJ SOLN
120.0000 mg | Freq: Once | INTRAVENOUS | Status: AC
  Administered 2020-08-09: 120 mg via INTRAVENOUS
  Filled 2020-08-09: qty 2

## 2020-08-09 MED ORDER — ACETAMINOPHEN 325 MG PO TABS: 650.0000 mg | ORAL_TABLET | Freq: Four times a day (QID) | ORAL | Status: DC | PRN

## 2020-08-09 MED ORDER — INSULIN ASPART 100 UNIT/ML ~~LOC~~ SOLN
0.0000 [IU] | Freq: Three times a day (TID) | SUBCUTANEOUS | Status: DC
  Administered 2020-08-09: 3 [IU] via SUBCUTANEOUS
  Administered 2020-08-09 – 2020-08-10 (×2): 2 [IU] via SUBCUTANEOUS
  Administered 2020-08-10: 3 [IU] via SUBCUTANEOUS
  Administered 2020-08-11: 2 [IU] via SUBCUTANEOUS
  Administered 2020-08-12: 3 [IU] via SUBCUTANEOUS
  Administered 2020-08-13: 5 [IU] via SUBCUTANEOUS
  Administered 2020-08-14: 2 [IU] via SUBCUTANEOUS

## 2020-08-09 MED ORDER — GLUCOSE 40 % PO GEL
2.0000 | ORAL | Status: AC
  Administered 2020-08-09: 75 g via ORAL
  Filled 2020-08-09: qty 2

## 2020-08-09 MED ORDER — HYDRALAZINE HCL 10 MG PO TABS: 10.0000 mg | ORAL_TABLET | Freq: Four times a day (QID) | ORAL | Status: DC | PRN

## 2020-08-09 NOTE — Evaluation (Signed)
Physical Therapy Evaluation Patient Details Name: [name] MRN: [id number] DOB: [birth year] Today's Date: 08/09/2020   History of Present Illness  Pt is an 86 y.o. male admitted 08/08/20 with SOB, worse with exertion, and RLE sores. Admitted 8/13 -8/18 with similar symptoms. PMH includes HTN, CAD, CKD, DM, wears 2L O2 baseline.  Clinical Impression  Pt was seen for evaluation of mobility and with 6L O2 was desaturating from just standing on side of bed.  Pt was able to stand but cannot maintain sats with the effort.  Required pursed lip breathing to recover his sat back to 90% or greater.   Sats fluctuate with effort even in recovery of movement, so will focus on need for O2 during mobility.      Follow Up Recommendations Home health PT;Supervision for mobility/OOB    Equipment Recommendations  Rolling walker with 5" wheels    Recommendations for Other Services       Precautions / Restrictions Precautions Precaution Comments: ck O2 sats Restrictions Weight Bearing Restrictions: No      Mobility  Bed Mobility Overal bed mobility: Modified Independent                Transfers Overall transfer level: Needs assistance Equipment used: None Transfers: Sit to/from Stand;Stand Pivot Transfers Sit to Stand: Supervision Stand pivot transfers: Supervision          Ambulation/Gait             General Gait Details: pt was unable to maintain O2 sats for gait  Stairs            Wheelchair Mobility    Modified Rankin (Stroke Patients Only)       Balance     Sitting balance-Leahy Scale: Good       Standing balance-Leahy Scale: Fair                               Pertinent Vitals/Pain Pain Assessment: No/denies pain Faces Pain Scale: Hurts a little bit Pain Location: R LE with removal of dressing Pain Descriptors / Indicators: Grimacing;Guarding Pain Intervention(s): Limited activity within patient's tolerance;Monitored during  session;Repositioned    Home Living Family/patient expects to be discharged to:: Private residence Living Arrangements: Spouse/significant other Available Help at Discharge: Family;Available 24 hours/day Type of Home: House Home Access: Stairs to enter   [employer] of Steps: 2-3 Home Layout: One level Home Equipment: Walker - 2 wheels;Shower seat Additional Comments: hhad nursing and PT care at home    Prior Function Level of Independence: Independent         Comments: sponge bathes, wife does cooking     Hand Dominance   Dominant Hand: Right    Extremity/Trunk Assessment   Upper Extremity Assessment Upper Extremity Assessment: Defer to OT evaluation    Lower Extremity Assessment Lower Extremity Assessment: Generalized weakness    Cervical / Trunk Assessment Cervical / Trunk Assessment: Normal  Communication   Communication: HOH  Cognition Arousal/Alertness: Awake/alert Behavior During Therapy: WFL for tasks assessed/performed Overall Cognitive Status: Within Functional Limits for tasks assessed                                        General Comments General comments (skin integrity, edema, etc.): pt had O2 sats down to 84%    Exercises     Assessment/Plan  PT Assessment Patient needs continued PT services  PT Problem List Decreased strength;Decreased activity tolerance;Decreased balance;Decreased mobility;Cardiopulmonary status limiting activity       PT Treatment Interventions DME instruction;Gait training;Stair training;Functional mobility training;Therapeutic activities;Therapeutic exercise;Balance training;Patient/family education    PT Goals (Current goals can be found in the Care Plan section)  Acute Rehab PT Goals Patient Stated Goal: Decreased SOB when I move PT Goal Formulation: With patient Time For Goal Achievement: 08/23/20 Potential to Achieve Goals: Good    Frequency Min 3X/week   Barriers to  discharge Decreased caregiver support pt is home with wife    Co-evaluation               AM-PAC PT "6 Clicks" Mobility  Outcome Measure Help needed turning from your back to your side while in a flat bed without using bedrails?: None Help needed moving from lying on your back to sitting on the side of a flat bed without using bedrails?: None Help needed moving to and from a bed to a chair (including a wheelchair)?: A Little Help needed standing up from a chair using your arms (e.g., wheelchair or bedside chair)?: A Little Help needed to walk in hospital room?: A Little Help needed climbing 3-5 steps with a railing? : A Little 6 Click Score: 20    End of Session Equipment Utilized During Treatment: Oxygen Activity Tolerance: Patient tolerated treatment well Patient left: in chair;with call bell/phone within reach;with chair alarm set Nurse Communication: Mobility status PT Visit Diagnosis: Other abnormalities of gait and mobility (R26.89);Muscle weakness (generalized) (M62.81)    Time: 9675-9163 PT Time Calculation (min) (ACUTE ONLY): 28 min   Charges:   PT Evaluation $PT Eval Moderate Complexity: 1 Mod         [name] 08/09/2020, 9:44 PM  [name], PT MS Acute Rehab Dept. Number: [place] and [place]

## 2020-08-09 NOTE — Progress Notes (Signed)
   08/09/20 0123  Assess: MEWS Score  Temp 98.1 F (36.7 C)  BP (!) 183/90  Pulse Rate (!) 47  Resp (!) 22  Level of Consciousness Alert  SpO2 98 %  Assess: MEWS Score  MEWS Temp 0  MEWS Systolic 0  MEWS Pulse 1  MEWS RR 1  MEWS LOC 0  MEWS Score 2  MEWS Score Color Yellow  Assess: if the MEWS score is Yellow or Red  Were vital signs taken at a resting state? Yes  Focused Assessment No change from prior assessment  Early Detection of Sepsis Score *See Row Information* Low  MEWS guidelines implemented *See Row Information* Yes  Treat  MEWS Interventions Escalated (See documentation below)  Pain Scale 0-10  Pain Score 0  Take Vital Signs  Increase Vital Sign Frequency  Yellow: Q 2hr X 2 then Q 4hr X 2, if remains yellow, continue Q 4hrs  Escalate  MEWS: Escalate Yellow: discuss with charge nurse/RN and consider discussing with provider and RRT  Notify: Charge Nurse/RN  Name of Charge Nurse/RN Notified Danae Chen RN  Date Charge Nurse/RN Notified 08/09/20  Time Charge Nurse/RN Notified 0130  Document  Patient Outcome Other (Comment);Not stable and remains on department  Progress note created (see row info) Yes  Patient arrived to unit with yellow MEWS. While none of this is an acute change, yellow MEWS protocol activated as precaution.

## 2020-08-09 NOTE — Evaluation (Signed)
Occupational Therapy Evaluation Patient Details Name: [name] MRN: [id number] DOB: 1932/06/17 Today's Date: 08/09/2020    History of Present Illness Pt is an 86 y.o. male admitted 08/08/20 with SOB, worse with exertion, and RLE sores. Admitted 8/13 -8/18 with similar symptoms. PMH includes HTN, CAD, CKD, DM, wears 2L O2 baseline.   Clinical Impression   Pt functions independently in self care, primarily sponge bathing due to low endurance. His wife cooks. He reports having home health therapy and nursing currently. Pt presents with poor activity tolerance and SOB. He was noted to desaturate to low 80s with transfer OOB to chair on 6L 02, bumped up to 8L and instructed in pursed lip breathing to recover to low 90s.  Pt is unaware of how to manage his heart failure. He does not weigh every day and reports he does not eat salt, but wife prepares meals. May benefit from further education.     Follow Up Recommendations  Home health OT;Supervision/Assistance - 24 hour    Equipment Recommendations  None recommended by OT    Recommendations for Other Services       Precautions / Restrictions Precautions Precautions: Other (comment) Precaution Comments: watch Sp02 closely      Mobility Bed Mobility Overal bed mobility: Modified Independent             General bed mobility comments: HOB up  Transfers Overall transfer level: Needs assistance Equipment used: None Transfers: Sit to/from [employer] Transfers Sit to Stand: Supervision Stand pivot transfers: Supervision       General transfer comment: no difficulties rising from bed and transferring to chair, limited mobility today due to SOB and desaturation to low 80s on 6L 02    Balance Overall balance assessment: Needs assistance   Sitting balance-Leahy Scale: Good       Standing balance-Leahy Scale: Fair                             ADL either performed or assessed with clinical judgement   ADL                                          General ADL Comments: Overall functioning at a supervision level.     Vision Patient Visual Report: No change from baseline       Perception     Praxis      Pertinent Vitals/Pain Pain Assessment: Faces Faces Pain Scale: Hurts a little bit Pain Location: R LE with removal of dressing Pain Descriptors / Indicators: Grimacing;Guarding Pain Intervention(s): Monitored during session     Hand Dominance Right   Extremity/Trunk Assessment Upper Extremity Assessment Upper Extremity Assessment: Overall WFL for tasks assessed   Lower Extremity Assessment Lower Extremity Assessment: Defer to PT evaluation   Cervical / Trunk Assessment Cervical / Trunk Assessment: Normal   Communication Communication Communication: HOH   Cognition Arousal/Alertness: Awake/alert Behavior During Therapy: WFL for tasks assessed/performed Overall Cognitive Status: Within Functional Limits for tasks assessed                                 General Comments: pt is unaware of how to manage his CHF   General Comments       Exercises     Shoulder Instructions  Home Living Family/patient expects to be discharged to:: Private residence Living Arrangements: Spouse/significant other Available Help at Discharge: Family;Available 24 hours/day Type of Home: House Home Access: Stairs to enter [employer] of Steps: 2-3   Home Layout: One level     Bathroom Shower/Tub: Occupational psychologist: Standard     Home Equipment: Environmental consultant - 2 wheels;Shower seat   Additional Comments: was receiving HHPT and RN was checking R LE      Prior Functioning/Environment Level of Independence: Independent        Comments: sponge bathes, wife does cooking        OT Problem List: Decreased activity tolerance      OT Treatment/Interventions: Self-care/ADL training    OT Goals(Current goals can be found in the  care plan section) Acute Rehab OT Goals Patient Stated Goal: Decreased SOB when I move OT Goal Formulation: With patient Time For Goal Achievement: 08/23/20 Potential to Achieve Goals: Good ADL Goals Additional ADL Goal #1: Pt will perform basic ADL modified indepedently. Additional ADL Goal #2: Pt will generalize energy conservation and breathing strategies in ADL and mobility.  OT Frequency: Min 2X/week   Barriers to D/C:            Co-evaluation              AM-PAC OT "6 Clicks" Daily Activity     Outcome Measure Help from another person eating meals?: None Help from another person taking care of personal grooming?: A Little Help from another person toileting, which includes using toliet, bedpan, or urinal?: A Little Help from another person bathing (including washing, rinsing, drying)?: A Little Help from another person to put on and taking off regular upper body clothing?: None Help from another person to put on and taking off regular lower body clothing?: A Little 6 Click Score: 20   End of Session Equipment Utilized During Treatment: Oxygen  Activity Tolerance: Treatment limited secondary to medical complications (Comment) (desats with minimum exertion) Patient left: in chair;with call bell/phone within reach;with chair alarm set  OT Visit Diagnosis: Other (comment) (decreased activity tolerance)                Time: 1610-9604 OT Time Calculation (min): 24 min Charges:  OT General Charges $OT Visit: 1 Visit OT Evaluation $OT Eval Moderate Complexity: 1 Mod  Nestor Lewandowsky, OTR/L Acute Rehabilitation Services Pager: [phone] Office: [phone]  Malka So 08/09/2020, 9:42 AM

## 2020-08-09 NOTE — Hospital Course (Addendum)
Mr [name] is a 86 year old make who has a PMHx of CHF, CKD, T2DM, CAD with MI, and HTN who presenting with worsening dyspnea and lower extremity swelling despite increase in home diuretics since discharge 07/24/2020 for acute exacerbation of CHF. Patient admitted 9/2/20201 for fluid overload and acute kidney injury in the setting of CKD.   #Acute Exacerbation of Chronic Diastolic Heart Failure History of diastolic HF with EF of 44-96% on last echo 07/20/20. Presents with worsening lower extremity and orthopnea for 1 week. He was recently discharge for CHF exacerbation on 07/24/2020 with lasix increased to 80 mg BID. Suspected etiology of acute exacerbation due to diet noncompliance notably eating more canned foods. Diureses on 120 IV lasix BID. Diuresed from 92.3 kg down to 85.6 kg with improved SOB, lower extremity edema, and improvement JVD with remaining external JVD and minimal internal JVD. Patient with increased O2 requirement of 6L [place] on admission weaned to 4L with good O2 saturation with activity. We continued home lasix of 80 mg BID on discharge with plan for follow up with PCP for adjustments to his diuretics based on his symptoms.  #Acute Kidney Injury with CKD - Patient with PMHx of CKD stage III. Presenting with acute increase in Cr to 2.16 from 1.62 during last admission 07/24/20, elevated urine sodium and protein/cr ratio.Suspected ischemic etiology from CHF exacerbation. Holding benazepril given elevated creatinine. Tolerating diuretic therapy.   #History of HTN Continued on home amlodipine 10 mg daily. Held benazepril in setting of AKI. BP remained under goal of 180/110 during admission. Restarted on home medications at discharge.   #History of CAD History of CABG in 1996. No EKG changes, Troponin I of 40 and 44. Prior admission patient had elevated troponin as well. Likely demand ischemia Nadolol initially held for bradycardia. Started on carvedilol 3.125 mg BID tolerating with HR in upper  50s. Continued aspirin 81 mg daily.  #History of Type 2 DM A1C of 6.4, 2 weeks ago. Initially placed on 18 units of Lantus, however he had 1 episode of hypoglycemia CBG 54 after missing dinner on day 2 of admission. Lantus adjusted to 10 units daily.    #Venous Stasis Dermatitis with Ulceration Patient followed by wound care since prior admission for venous statis dermatitis of RLE and continued with wound care during admission. No history of PAD or symptoms or claudication or rest pain will consider unna boot if he continues to have significant edema.    You were hospitalized for worsening of your heart failure. Thank you for allowing [place] to be part of your care.    Please follow up with the following providers: Garnette Czech. Delilah Shan, NP for follow up on 08/21/2020 at 2:45.  Please note these changes made to your medications:   - Medications to continue: Amlodipine, aspirin, benazepril, clonidine,  ezetimibe, furosemide, vascepa, rosuvastatin, humalog, lantus, synthroid, and potassium chloride.    - Medications to start: Please start taking carvedilol 6.25 mg 2 times a day with meals  - Medications to discontinue:  Please stop taking nadolol   Please make sure to use your walker consistently after discharge, stand up slowly and take frequent breaks when moving around to avoid shortness of breath.   Please call our clinic if you have any questions or concerns, we may be able to help and keep you from a long and expensive emergency room wait. Our clinic and after hours phone number is [phone], the best time to call is Monday through Friday 9 am  to 4 pm but there is always someone available 24/7 if you have an emergency. If you need medication refills please notify your pharmacy one week in advance and they will send [place] a request.

## 2020-08-09 NOTE — ED Notes (Addendum)
RN into room pt had been laying flat in hospital bed. Pt SOB, audibly wheezing, dyspnea at rest. Pt's monitor had been turned off. RN turned monitor on and pt O2 sat was 77% on 2L [place]. RN turned pt's O2 to 6L [place] and administering duoneb. Pt 100% on 6L [place]. Pharmacy called for pt's IV lasix. Pt resting comfortably after neb. Wheezing improved.

## 2020-08-09 NOTE — Consult Note (Signed)
WOC Nurse Consult Note: Patient seen in room 3E10. No family members present. Reason for Consult: RLE wound care Wound type: No open wound observed. Pressure Injury POA: NA Wound bed: scattered healed areas that were weeping per patient Drainage (amount, consistency, odor) None Dressing procedure/placement/frequency: Gently wash RLE with soap and water, pat dry. Apply Sween moisturizing ointment (pink and white tube in clean utility). Beginning behind the toes, spiral wrap kerlex to just below the knee then 4" ace bandage in the same way. Perform daily.   Cathlean Marseilles Tamala Julian, MSN, RN, CMSRN, [place], [facility] Wound Treatment Associate Wound, Ostomy, Continence Nurse Office [phone]

## 2020-08-09 NOTE — Progress Notes (Signed)
Subjective:  Rough night due to having trouble getting comfortable to breath while sleeping. Patient reports that he feels better sitting up in a chair. He does not report chest pain, but does report SOB. Patient also reports having some sores on his R leg. Patient reports minor activity caused exacerbation of SOB. He reports this is better today. Patient reports that he is also making more urine here in the hospital.  Patient reports that he did have some swelling with some erythema around his scrotum early this week now no longer red and swelling has improved. He denies any pain.  States he has been adherent to diuretic medications, typically eats food cooked by wife that frequently incorporates canned foods. Objective:  Vital signs in last 24 hours: Vitals:   08/09/20 0323 08/09/20 0353 08/09/20 0558 08/09/20 0823  BP:  (!) 165/77 (!) 175/92 (!) 174/83  Pulse:  (!) 53 (!) 52 80  Resp:  (!) 24 (!) 23 20  Temp:  98 F (36.7 C) 97.8 F (36.6 C) 97.7 F (36.5 C)  TempSrc:  Oral Oral Oral  SpO2:  91% 92% 94%  Weight: 92.3 kg     Height: 5\' 9"  (1.753 m)      Constitutional: Sitting up on recliner, appears comfortable on 5L nasal cannula. No acute distress Neck: positive hepatojugular reflux with JVD up to chin Respiratory: Normal work of breathing. No wheezing. No decreased breath sounds Cardiovascular: Bradycardic normal rhythm, no murmur or gallop. 2+ pitting edema up to sacrum Skin: Chronic venous statis changes to RLE with dressing adherent to healing wounds Exetremities: DP pulses intact and symmetric GU: mild scrotal swelling without erythema, warm, or drainage  Assessment/Plan:  Active Problems:   Acute exacerbation of CHF (congestive heart failure) (HCC)  Devone Tousley is an 86 y/o M with a PMHx of CHF, CKD, T2DM, CAD with MI, and HTN who presents to the ED with dyspnea and lower extremity swelling for the past day. Patient was recently admitted on 07/19/20 and discharged 5  days later for acute exacerbation of CHF. His lasix was increased to 80 mg BID, his amlodipine was increased and his nadolol was decreased. On physical exam patient was found to have JVD, was bradycardic, and had bilateral 2+ lower extremity edema. Of significance, his BMP revealed creatinine of 2.16 and BUN of 30. Patient to be admitted for fluid overload and acute kidney injury in the setting of CKD.  #Acute Exacerbation of Chronic Diastolic Heart Failure - History of diastolic HF. Last echo 07/20/20 EF 60-65% - Presents with worsening lower extremity swelling and orthopnea after recent discharge for CHF exacerbation with lasix increased to 80 mg BID.- Suspected etiology of acute exacerbation due to diet noncompliance. Troponin's are at baseline a 44 with EKG changes seen likely related to demand ischemia.  -He is reporting improved SOB after 1 dose of furosemide O2 requirement down to 5L.continues to have significant edema to the sacrum - Exam revealed JVD, 2+bilateral pitting edema - X-ray revealed cardiomegaly with pulmonary edema - Creatinine of 2.16 stable with diuretic therapy, will continue on Furosemide 120 mg IV BID - Strict I/O's - Daily weights - Daily BMP's - Diet with fluid restriction ordered - PT/OT ordered due to patient's decompensation  #Acute Kidney Injury with CKD - Patient with PMHx of CKD stage III. Presenting with acute increase in Cr to 2.16 from 1.62 during last admission 07/24/20.Suspected ischemic etiology from CHF exacerbation with elevated urine Na and protein/cr ratio - Cr stable  at 2.13 after diuretic therapy - continue to trend monitor BMPs - holding benazepril   #History of HTN - BP elevated to 174/93 - Hydralazine 10 mg PRN, BP goal<180/110 - Continue amlodipine 10 mg QD - Holding benazopril in setting of AKI  #History of CAD - CABG in 1996 - No EKG changes, Troponin I of 40 and 44. Prior admission patient had elevated troponin as well - Holding  nadolol 20 mg daily at this time due to bradycardia, HR improved to 80 will consider starting him on Coreg if heart rate remains stable - Continue aspirin 81 mg daily  #Wheezing - now resolved with diuretic therarpy history of COPD, Asthma - Duoneb 78mL Q4H PRN ordered  #History of Type 2 DM - A1C of 6.4, 2 weeks ago CBG 102 - Lantus 18 units daily at bedtime ordered along with novolog sliding scale.  - Hypoglycemia protocol in place, stat cbg if hypoglycemia suspected ordered  #History of HLD - LDL of 52 on 07/21/20 - Continue rosuvastatin 20 mg tab daily and ezetimibe 10 mg daily.  #History of Hypothyroidism - TSH of 3.750 on 07/21/20 - Continue levothyroxine 100 mcg daily  #OSA - CPAP ordered  #Venous Stasis Dermatitis with Ulceration - Patient followed by wound care since prior admission - No history of PAD or symptoms or claudication or rest pain will consider unna boot if he continues to have significant edema  - Will continue to need wound care   Diet: Renal/Carb modified DVT Prophylaxis: Lovenox Code: Full Code Dispo: Admit patient to inpatient  Iona Beard, MD 08/09/2020, 10:29 AM Pager: [phone] After 5pm on weekdays and 1pm on weekends: On Call pager [phone]

## 2020-08-10 LAB — CBC
HCT: 46.8 % (ref 39.0–52.0)
Hemoglobin: 15 g/dL (ref 13.0–17.0)
MCH: 28.4 pg (ref 26.0–34.0)
MCHC: 32.1 g/dL (ref 30.0–36.0)
MCV: 88.6 fL (ref 80.0–100.0)
Platelets: 244 10*3/uL (ref 150–400)
RBC: 5.28 MIL/uL (ref 4.22–5.81)
RDW: 16.7 % — ABNORMAL HIGH (ref 11.5–15.5)
WBC: 10.1 10*3/uL (ref 4.0–10.5)
nRBC: 0 % (ref 0.0–0.2)

## 2020-08-10 LAB — GLUCOSE, CAPILLARY
Glucose-Capillary: 43 mg/dL — CL (ref 70–99)
Glucose-Capillary: 56 mg/dL — ABNORMAL LOW (ref 70–99)
Glucose-Capillary: 67 mg/dL — ABNORMAL LOW (ref 70–99)
Glucose-Capillary: 74 mg/dL (ref 70–99)
Glucose-Capillary: 130 mg/dL — ABNORMAL HIGH (ref 70–99)
Glucose-Capillary: 161 mg/dL — ABNORMAL HIGH (ref 70–99)
Glucose-Capillary: 146 mg/dL — ABNORMAL HIGH (ref 70–99)

## 2020-08-10 LAB — BASIC METABOLIC PANEL
Anion gap: 10 (ref 5–15)
BUN: 32 mg/dL — ABNORMAL HIGH (ref 8–23)
CO2: 26 mmol/L (ref 22–32)
Calcium: 9 mg/dL (ref 8.9–10.3)
Chloride: 109 mmol/L (ref 98–111)
Creatinine, Ser: 2.1 mg/dL — ABNORMAL HIGH (ref 0.61–1.24)
GFR calc Af Amer: 32 mL/min — ABNORMAL LOW (ref >60)
GFR calc non Af Amer: 28 mL/min — ABNORMAL LOW (ref >60)
Glucose, Bld: 54 mg/dL — ABNORMAL LOW (ref 70–99)
Potassium: 3.8 mmol/L (ref 3.5–5.1)
Sodium: 145 mmol/L (ref 135–145)

## 2020-08-10 LAB — MAGNESIUM: Magnesium: 2.2 mg/dL (ref 1.7–2.4)

## 2020-08-10 MED ORDER — DEXTROSE 50 % IV SOLN
25.0000 g | INTRAVENOUS | Status: AC
  Administered 2020-08-10: 25 g via INTRAVENOUS
  Filled 2020-08-10 (×2): qty 50

## 2020-08-10 MED ORDER — INSULIN GLARGINE 100 UNIT/ML ~~LOC~~ SOLN
10.0000 [IU] | Freq: Every day | SUBCUTANEOUS | Status: DC
  Administered 2020-08-10 – 2020-08-13 (×4): 10 [IU] via SUBCUTANEOUS
  Filled 2020-08-10 (×5): qty 0.1

## 2020-08-10 MED ORDER — CARVEDILOL 3.125 MG PO TABS
3.1250 mg | ORAL_TABLET | Freq: Two times a day (BID) | ORAL | Status: DC
  Administered 2020-08-10 – 2020-08-12 (×5): 3.125 mg via ORAL
  Filled 2020-08-10 (×5): qty 1

## 2020-08-10 NOTE — Progress Notes (Signed)
Pt had two episodes to hypoglycemia reported overnight , this am we did a recheck and pt is 69, juice given ,breakfast at bedside, pt asymptomatic.  MD paged  Insulin held  Will continue to monitor

## 2020-08-10 NOTE — Progress Notes (Signed)
HD#1 Subjective:  Overnight Events: No events over nights.    Patient states that he is doing well. He admits to good urine output and believes that his swelling is improving. He also states that his dyspnea has improved. He denies any other symptoms.  Objective:  Vital signs in last 24 hours: Vitals:   08/09/20 1534 08/09/20 2017 08/10/20 0109 08/10/20 0323  BP: (!) 166/77 (!) 172/85 (!) 145/79 (!) 141/72  Pulse: (!) 57 (!) 57 60 (!) 54  Resp: 18 19 19 19   Temp: 97.8 F (36.6 C) 97.9 F (36.6 C) 97.9 F (36.6 C) 98.3 F (36.8 C)  TempSrc: Oral Oral Oral Oral  SpO2: 93% 91% 95% 97%  Weight:    89.4 kg  Height:       Supplemental O2: Room Air SpO2: 97 % O2 Flow Rate (L/min): 8 L/min   Physical Exam:  Physical Exam Constitutional:      General: He is not in acute distress.    Appearance: Normal appearance. He is not ill-appearing or diaphoretic.  HENT:     Head: Normocephalic and atraumatic.  Eyes:     Extraocular Movements: Extraocular movements intact.  Neck:     Vascular: JVD present.  Cardiovascular:     Rate and Rhythm: Normal rate.     Pulses: Normal pulses.     Heart sounds: Normal heart sounds.  Pulmonary:     Effort: Pulmonary effort is normal.     Breath sounds: Wheezing (upper respiratory) and rales (basilar) present.  Abdominal:     General: Bowel sounds are normal.     Palpations: Abdomen is soft.     Tenderness: There is no abdominal tenderness.  Musculoskeletal:        General: Swelling present. Normal range of motion.     Cervical back: Normal range of motion.     Right lower leg: Edema (2+ to his knees) present.     Left lower leg: Edema (2+ to his knees) present.  Skin:    General: Skin is warm and dry.  Neurological:     Mental Status: He is alert and oriented to person, place, and time. Mental status is at baseline.  Psychiatric:        Mood and Affect: Mood normal.     Filed Weights   08/09/20 0323 08/10/20 0323  Weight: 92.3  kg 89.4 kg     Intake/Output Summary (Last 24 hours) at 08/10/2020 0609 Last data filed at 08/10/2020 0325 Gross per 24 hour  Intake 240 ml  Output 975 ml  Net -735 ml   Net IO Since Admission: -1,310 mL [08/10/20 0609]  Pertinent Labs: CBC Latest Ref Rng & Units 08/09/2020 08/08/2020 07/19/2020  WBC 4.0 - 10.5 K/uL 10.9(H) 7.2 7.6  Hemoglobin 13.0 - 17.0 g/dL 14.9 13.0 14.9  Hematocrit 39 - 52 % 47.7 41.1 47.5  Platelets 150 - 400 K/uL 244 195 200    CMP Latest Ref Rng & Units 08/09/2020 08/08/2020 07/24/2020  Glucose 70 - 99 mg/dL 106(H) 96 114(H)  BUN 8 - 23 mg/dL 30(H) 30(H) 20  Creatinine 0.61 - 1.24 mg/dL 2.13(H) 2.16(H) 1.62(H)  Sodium 135 - 145 mmol/L 143 139 142  Potassium 3.5 - 5.1 mmol/L 4.6 5.1 3.6  Chloride 98 - 111 mmol/L 109 109 107  CO2 22 - 32 mmol/L 23 21(L) 29  Calcium 8.9 - 10.3 mg/dL 9.4 8.5(L) 8.7(L)  Total Protein 6.5 - 8.1 g/dL - - -  Total Bilirubin  0.3 - 1.2 mg/dL - - -  Alkaline Phos 38 - 126 U/L - - -  AST 15 - 41 U/L - - -  ALT 17 - 63 U/L - - -    Pending Labs: none  Imaging: Renal US: 1. Cortical defect involving the upper pole the right kidney possibly related to prior surgical resection. 2. Increased cortical echogenicity involving both kidneys suggesting changes of underlying medical renal disease. 3. No evidence of hydronephrosis or other abnormality. 4. Incidental large left pleural effusion  Assessment/Plan:   Active Problems:   AKI (acute kidney injury) ([place])   Acute exacerbation of CHF (congestive heart failure) ([place])   Peripheral edema    has a past medical history of Chronic kidney disease, Coronary artery disease, Diabetes mellitus, Hypertension, Myocardial infarction ([place]), and Right renal mass.  Patient Summary: [name] is an 86 y/o M with a PMHx of CHF, CKD, T2DM, CAD with MI, and HTN who presents to the ED with dyspnea and lower extremity swelling for the past day and admitted for hypervolemia complicated by AKI on  CKD.  #Acute Exacerbation of Chronic Diastolic Heart Failure - Patient admits to improved shortness of breath and swelling. He admits to having good urine output overnight.  - Continue Carvedilol 3.125 mg bid - Continue IV lasix 120 mg BID - Strict I/O, fluid and salt restriction,   #Acute Kidney Injury with CKD - Patient continues to have improved kidney function with IV diuresis. CR of 2.10 (GFR 32). His baseline kidney function is CR of 1.7 and GFR of 40. - Continue to avoid nephrotoxic medications when possible - Hold ACEI - Repeat BMP tomorrow.  #History of HTN - Hydralazine 10 mg PRN, BP goal<180/110 - Continue amlodipine 10 mg QD - Holding benazopril in setting of AKI  #History of CAD and HLD - Continue rosuvastatin 20 mg and zetia 10 mg - Continue ASA 81 mg  #History of Type 2 DM -  Patient had episode of hypoglycemia (40s) overnight that improved with juice. I will decrease his lantus to 10 units daily and titrated as needed.  - Lantus 10 units daily and SSI   Diet: Heart Healthy IVF: None,None VTE: Heparin Code: Full PT/OT recs: Pending, none. TOC recs: pending    Dispo: Anticipated discharge to TBD in 2 days pending diuresis.    Marianna Payment, D.O. [facility], PGY-2 Date 08/10/2020 Time 6:09 AM Pager: [phone] Please contact the on call pager after 5 pm and on weekends at [phone].

## 2020-08-11 LAB — CBC
HCT: 45.1 % (ref 39.0–52.0)
Hemoglobin: 14.1 g/dL (ref 13.0–17.0)
MCH: 27.3 pg (ref 26.0–34.0)
MCHC: 31.3 g/dL (ref 30.0–36.0)
MCV: 87.2 fL (ref 80.0–100.0)
Platelets: 210 10*3/uL (ref 150–400)
RBC: 5.17 MIL/uL (ref 4.22–5.81)
RDW: 16.3 % — ABNORMAL HIGH (ref 11.5–15.5)
WBC: 9.6 10*3/uL (ref 4.0–10.5)
nRBC: 0 % (ref 0.0–0.2)

## 2020-08-11 LAB — BASIC METABOLIC PANEL
Anion gap: 9 (ref 5–15)
BUN: 29 mg/dL — ABNORMAL HIGH (ref 8–23)
CO2: 28 mmol/L (ref 22–32)
Calcium: 8.6 mg/dL — ABNORMAL LOW (ref 8.9–10.3)
Chloride: 107 mmol/L (ref 98–111)
Creatinine, Ser: 2 mg/dL — ABNORMAL HIGH (ref 0.61–1.24)
GFR calc Af Amer: 34 mL/min — ABNORMAL LOW (ref >60)
GFR calc non Af Amer: 29 mL/min — ABNORMAL LOW (ref >60)
Glucose, Bld: 106 mg/dL — ABNORMAL HIGH (ref 70–99)
Potassium: 3.6 mmol/L (ref 3.5–5.1)
Sodium: 144 mmol/L (ref 135–145)

## 2020-08-11 LAB — GLUCOSE, CAPILLARY
Glucose-Capillary: 85 mg/dL (ref 70–99)
Glucose-Capillary: 100 mg/dL — ABNORMAL HIGH (ref 70–99)
Glucose-Capillary: 127 mg/dL — ABNORMAL HIGH (ref 70–99)
Glucose-Capillary: 172 mg/dL — ABNORMAL HIGH (ref 70–99)

## 2020-08-11 NOTE — Progress Notes (Signed)
Pt refused CPAP for the night.   

## 2020-08-11 NOTE — Progress Notes (Signed)
Subjective:  No acute overnight events. He states he is feeling well with improved dyspnea and swelling. Otherwise, he denies any other symptoms  Objective:  Vital signs in last 24 hours: Vitals:   08/10/20 1206 08/10/20 1957 08/11/20 0118 08/11/20 0449  BP: (!) 170/90 (!) 144/76 140/72 (!) 155/87  Pulse:  (!) 51 (!) 55 (!) 58  Resp: 17 19 20 19   Temp: 98.1 F (36.7 C) 97.9 F (36.6 C) 98 F (36.7 C) 98.4 F (36.9 C)  TempSrc: Skin Oral Oral Oral  SpO2: (!) 89% 98% 91% 97%  Weight:   88.9 kg   Height:       Physical Exam Constitutional:      General: He is not in acute distress. Eyes:     Extraocular Movements: Extraocular movements intact.     Pupils: Pupils are equal, round, and reactive to light.  Neck:     Vascular: JVD present.  Cardiovascular:     Rate and Rhythm: Regular rhythm. Bradycardia present.     Heart sounds: No murmur heard.  No friction rub.  Pulmonary:     Breath sounds: Wheezing (upper lung fields) present.     Comments: Bibasilar crackles Abdominal:     General: Bowel sounds are normal.     Palpations: Abdomen is soft.  Musculoskeletal:     Cervical back: Normal range of motion.     Comments: 2+ pitting edema to knees, RLE wound dressed and wrapped  Skin:    Capillary Refill: Capillary refill takes less than 2 seconds.  Neurological:     General: No focal deficit present.     Mental Status: He is alert and oriented to person, place, and time.    Assessment/Plan:  Active Problems:   AKI (acute kidney injury) ([place])   Acute exacerbation of CHF (congestive heart failure) ([place])   Peripheral edema  Patient Summary: Mr [name] is a 86 year old make who has a PMHx of CHF, CKD, T2DM, CAD with MI, and HTN who presenting with worsening dyspnea and lower extremity swelling despite increase in home diuretics since discharge 07/24/2020 for acute exacerbation of CHF. Patient admitted 9/2/20201 for fluid overload and acute kidney injury in the setting of  CKD.  #Acute Exacerbation of Chronic Diastolic Heart Failure - Patient reporting improved SOB and edema with good urine output overnight. - Continue Carvedilol 3.125 mg bid - Continue IV lasix 120 mg BID - Strict I/O, fluid and salt restriction,   #Acute Kidney Injury with CKD - Patient continues to have improved kidney function with IV diuresis. CR of 2.10 (GFR 32). His baseline kidney function is CR of 1.7 and GFR of 40. - Continue to avoid nephrotoxic medications when possible - Hold ACEI - Repeat BMP tomorrow.  #History of HTN -  BP stable between 140s and 150s - Hydralazine 10 mg PRN, BP goal<180/110 - Continue amlodipine 10 mg QD - Holding benazopril in setting of AKI  #History of CAD and HLD - Continue rosuvastatin 20 mg and zetia 10 mg - Continue ASA 81 mg  #History of Type 2 DM -  Patient had episode of hypoglycemia (40s) overnight that improved with juice. I will decrease his lantus to 10 units daily and titrated as needed.  - Lantus 10 units daily and SSI  Diet: Heart Healthy IVF: None,None VTE: Heparin Code: Full PT/OT recs: Pending, none. TOC recs: pending   Dispo: Anticipated discharge to TBD in 1 days pending diuresis.   Iona Beard, MD 08/11/2020,  6:19 AM Pager: [phone] After 5pm on weekdays and 1pm on weekends: On Call pager [phone]

## 2020-08-12 LAB — GLUCOSE, CAPILLARY
Glucose-Capillary: 105 mg/dL — ABNORMAL HIGH (ref 70–99)
Glucose-Capillary: 111 mg/dL — ABNORMAL HIGH (ref 70–99)
Glucose-Capillary: 187 mg/dL — ABNORMAL HIGH (ref 70–99)
Glucose-Capillary: 151 mg/dL — ABNORMAL HIGH (ref 70–99)

## 2020-08-12 LAB — CBC
HCT: 43.4 % (ref 39.0–52.0)
Hemoglobin: 13.8 g/dL (ref 13.0–17.0)
MCH: 27.8 pg (ref 26.0–34.0)
MCHC: 31.8 g/dL (ref 30.0–36.0)
MCV: 87.3 fL (ref 80.0–100.0)
Platelets: 180 10*3/uL (ref 150–400)
RBC: 4.97 MIL/uL (ref 4.22–5.81)
RDW: 16.1 % — ABNORMAL HIGH (ref 11.5–15.5)
WBC: 8.3 10*3/uL (ref 4.0–10.5)
nRBC: 0 % (ref 0.0–0.2)

## 2020-08-12 LAB — BASIC METABOLIC PANEL
Anion gap: 8 (ref 5–15)
BUN: 28 mg/dL — ABNORMAL HIGH (ref 8–23)
CO2: 28 mmol/L (ref 22–32)
Calcium: 8.4 mg/dL — ABNORMAL LOW (ref 8.9–10.3)
Chloride: 108 mmol/L (ref 98–111)
Creatinine, Ser: 2.03 mg/dL — ABNORMAL HIGH (ref 0.61–1.24)
GFR calc Af Amer: 33 mL/min — ABNORMAL LOW (ref >60)
GFR calc non Af Amer: 29 mL/min — ABNORMAL LOW (ref >60)
Glucose, Bld: 107 mg/dL — ABNORMAL HIGH (ref 70–99)
Potassium: 3.4 mmol/L — ABNORMAL LOW (ref 3.5–5.1)
Sodium: 144 mmol/L (ref 135–145)

## 2020-08-12 LAB — MAGNESIUM: Magnesium: 2 mg/dL (ref 1.7–2.4)

## 2020-08-12 MED ORDER — CARVEDILOL 6.25 MG PO TABS
6.2500 mg | ORAL_TABLET | Freq: Two times a day (BID) | ORAL | Status: DC
  Administered 2020-08-12 – 2020-08-14 (×4): 6.25 mg via ORAL
  Filled 2020-08-12 (×4): qty 1

## 2020-08-12 MED ORDER — POTASSIUM CHLORIDE 20 MEQ PO PACK
20.0000 meq | PACK | Freq: Two times a day (BID) | ORAL | Status: DC
  Administered 2020-08-12 – 2020-08-14 (×5): 20 meq via ORAL
  Filled 2020-08-12 (×5): qty 1

## 2020-08-12 NOTE — Progress Notes (Signed)
Occupational Therapy Treatment Patient Details Name: [name] MRN: [id number] DOB: 1931-12-25 Today's Date: 08/12/2020    History of present illness Pt is an 86 y.o. male admitted 08/08/20 with SOB, worse with exertion, and RLE sores. Admitted 8/13 -8/18 with similar symptoms. PMH includes HTN, CAD, CKD, DM, wears 2L O2 baseline.   OT comments  This 86 yo male admitted with above presents to acute OT today with education on purse lipped breathing and energy conservation when doing basic ADLs. Pt was 89% on 2 liters of O2 upon arrival, had him sit up and do some purse lipped breathing but O2 as not coming up, increased to 3 liters and only up to 91% so upped to 4 liters (up to 94%). Pt did toilet transfer and then sat at sink to do grooming (wash face, wash hands, oral care, comb hair) on 4 liters with sats down to 90%, turned up to 6 liters with having pt again doing purse lipped breathing and sats up to 96%. Gave him 5 minutes on 6 liters then dropped down to 4 liters with pt at 94% for 5 minutes and then back to 2 liters with pt maintaining 94%. Pt without c/o SOB with any of this (despite his sats being low).We will continue to follow.  Follow Up Recommendations  Home health OT;Supervision/Assistance - 24 hour    Equipment Recommendations  None recommended by OT       Precautions / Restrictions Precautions Precaution Comments: watch O2 sats Restrictions Weight Bearing Restrictions: No       Mobility Bed Mobility Overal bed mobility: Modified Independent             General bed mobility comments: HOB up  Transfers Overall transfer level: Needs assistance Equipment used: None Transfers: Sit to/from Stand Sit to Stand: Supervision         General transfer comment: no difficulties rising from bed and transferring to chair,    Balance Overall balance assessment: Mild deficits observed, not formally tested                                         ADL  either performed or assessed with clinical judgement   ADL Overall ADL's : Needs assistance/impaired     Grooming: Set up;Sitting;Wash/dry hands;Wash/dry face;Oral care;Brushing hair Grooming Details (indicate cue type and reason): We talked about sitting takes less energy and thus less oxygen than standing to do the same tasks. He says he has a stool he can put at his bathroom sink that he can sit/prop on when he does his grooming tasks                 Toilet Transfer: Supervision/safety;Ambulation;Regular Toilet;Grab bars   Toileting- Clothing Manipulation and Hygiene: Supervision/safety;Sit to/from stand         General ADL Comments: We dicussed purse lipped breathing when he feels short of breath and do to this in increments of 5 reps until he feels his breathing is better.     Vision Baseline Vision/History: Wears glasses Wears Glasses: Reading only Patient Visual Report: No change from baseline            Cognition Arousal/Alertness: Awake/alert Behavior During Therapy: WFL for tasks assessed/performed Overall Cognitive Status: Within Functional Limits for tasks assessed  Pertinent Vitals/ Pain       Pain Assessment: No/denies pain         Frequency  Min 2X/week        Progress Toward Goals  OT Goals(current goals can now be found in the care plan section)   Pregressing  Acute Rehab OT Goals Patient Stated Goal: for my breathing to get better OT Goal Formulation: With patient Time For Goal Achievement: 08/23/20 Potential to Achieve Goals: Good  Plan         AM-PAC OT "6 Clicks" Daily Activity     Outcome Measure   Help from another person eating meals?: None Help from another person taking care of personal grooming?: A Little Help from another person toileting, which includes using toliet, bedpan, or urinal?: A Little Help from another person bathing (including  washing, rinsing, drying)?: A Little Help from another person to put on and taking off regular upper body clothing?: A Little Help from another person to put on and taking off regular lower body clothing?: A Little 6 Click Score: 19    End of Session Equipment Utilized During Treatment: Oxygen (2-6 liters)  OT Visit Diagnosis:  (cardio pulmonary limiting activity)   Activity Tolerance Patient tolerated treatment well   Patient Left in chair;with call bell/phone within reach;with chair alarm set   Nurse Communication          Time: 0037-0488 OT Time Calculation (min): 50 min  Charges: OT General Charges $OT Visit: 1 Visit OT Treatments $Self Care/Home Management : 38-52 mins  [place] [address], OTR/L Acute [employer] Pager [phone] Office [phone]      Almon Register 08/12/2020, 11:58 AM

## 2020-08-12 NOTE — Progress Notes (Addendum)
Subjective:  No acute overnight events. Patient reports that his legs are "down to my size."  Denies fever, CP, SOB, and abdominal pain.  Objective:  Vital signs in last 24 hours: Vitals:   08/12/20 0031 08/12/20 0459 08/12/20 0808 08/12/20 1206  BP: (!) 156/80 (!) 150/76 (!) 152/83 (!) 163/77  Pulse: 66 (!) 51 66 62  Resp: 19 19 20 18   Temp: 99.5 F (37.5 C) 98.5 F (36.9 C) 98.1 F (36.7 C) 98.4 F (36.9 C)  TempSrc: Oral Oral Oral Oral  SpO2: 91% 94% 90% (!) 88%  Weight: 88 kg     Height:       Physical Exam Constitutional:      Appearance: He is well-developed.  Neck:     Comments: Improved JVD now to middle of the neck Cardiovascular:     Rate and Rhythm: Normal rate and regular rhythm.     Comments: Improved lower extremity edema with 1+ in both legs. Pulmonary:     Effort: Pulmonary effort is normal.     Breath sounds: Normal breath sounds.  Abdominal:     General: Bowel sounds are normal.     Palpations: Abdomen is soft.  Neurological:     Mental Status: He is alert.    CMP Latest Ref Rng & Units 08/12/2020 08/11/2020 08/10/2020  Glucose 70 - 99 mg/dL 107(H) 106(H) 54(L)  BUN 8 - 23 mg/dL 28(H) 29(H) 32(H)  Creatinine 0.61 - 1.24 mg/dL 2.03(H) 2.00(H) 2.10(H)  Sodium 135 - 145 mmol/L 144 144 145  Potassium 3.5 - 5.1 mmol/L 3.4(L) 3.6 3.8  Chloride 98 - 111 mmol/L 108 107 109  CO2 22 - 32 mmol/L 28 28 26   Calcium 8.9 - 10.3 mg/dL 8.4(L) 8.6(L) 9.0  Total Protein 6.5 - 8.1 g/dL - - -  Total Bilirubin 0.3 - 1.2 mg/dL - - -  Alkaline Phos 38 - 126 U/L - - -  AST 15 - 41 U/L - - -  ALT 17 - 63 U/L - - -   CBC    Component Value Date/Time   WBC 8.3 08/12/2020 0426   RBC 4.97 08/12/2020 0426   HGB 13.8 08/12/2020 0426   HCT 43.4 08/12/2020 0426   PLT 180 08/12/2020 0426   MCV 87.3 08/12/2020 0426   MCH 27.8 08/12/2020 0426   MCHC 31.8 08/12/2020 0426   RDW 16.1 (H) 08/12/2020 0426   LYMPHSABS 0.7 08/08/2020 1944   MONOABS 0.7 08/08/2020 1944   EOSABS  0.1 08/08/2020 1944   BASOSABS 0.1 08/08/2020 1944     Assessment/Plan:  Active Problems:   AKI (acute kidney injury) ([place])   Acute exacerbation of CHF (congestive heart failure) (HCC)   Peripheral edema  Patient Summary: Mr Kier is a 86 year old make who has a PMHx of CHF, CKD, T2DM, CAD with MI, and HTN who presenting with worsening dyspnea and lower extremity swelling despite increase in home diuretics since discharge 07/24/2020 for acute exacerbation of CHF. Patient admitted9/2/20201 forfluid overload and acute kidney injury in the setting of CKD.  #Acute Exacerbation of Chronic Diastolic Heart Failure- Patient reporting improved  edema edema in feet now nearly resolved. On physical exam edema is significantly improved with improved JVD as well. Now on 4 L [place]. Telemetry with run of PVCs will increase coreg. - Increased Carvedilol to 6.25 mg bid, monitor vitals for bradycardia - Continue IV lasix 120 mg BID - Potassium 3.4 today, repleted - Follow up on Mg - Strict  I/O, fluid and salt restriction  #Acute Kidney Injury with CKD.  His prior baseline kidney function is CR of 1.7 and GFR of 40. His Cr 2.03 today which has been stable despite diuresis may represent new baseline. - Continue to avoid nephrotoxic medications when possible - Hold ACEI - Repeat BMP tomorrow.  #History of HTN - BP elevated today 163/77, increase carvedilol for EKG changes may also improve BP control - Hydralazine 10 mg PRN, BP goal<180/110 - Continue amlodipine 10 mg QD - Holding benazopril in setting of AKI  #History of CADand HLD -Continue rosuvastatin 20 mg and zetia 10 mg - Continue ASA 81 mg  #History of Type 2 DM- Patient had episode of hypoglycemia (40s) overnight that improved with juice. I will decrease his lantus to 10 units daily and titrated as needed.  - Lantus 10units daily and SSI  Diet:Heart Healthy HSJ:WTGR MBO:BOFPULG Code:Full PT/OT recs:Home health PT TOC  recs:pending  Dispo: Anticipated discharge in 1days pendingdiuresis.   Iona Beard, MD 08/11/2020, 6:19 AM Pager: [phone] After 5pm on weekdays and 1pm on weekends: On Call pager [phone]

## 2020-08-12 NOTE — Progress Notes (Signed)
Physical Therapy Treatment Patient Details Name: [name] MRN: [id number] DOB: Feb 27, 1932 Today's Date: 08/12/2020    History of Present Illness Pt is an 86 y.o. male admitted 08/08/20 with SOB, worse with exertion, and RLE sores. Admitted 8/13 -8/18 with similar symptoms. PMH includes HTN, CAD, CKD, DM, wears 2L O2 baseline.    PT Comments    Pt very pleasant and eager to mobilize. On arrival pt on 2L with sats 85% with increase to 6L to achieve 92%. Initiated gait with 8L HFNC with sats 92% with gradual titration to 4L during gait with 92%. Pt with very inconsistent readings for pulse ox and difficult to judge accurately as pt without SOB throughout. Pt educated for walking program, standing rests, monitoring SpO2 with home pulse ox and continued HEP.    Follow Up Recommendations  Home health PT;Supervision for mobility/OOB     Equipment Recommendations  Rolling walker with 5" wheels    Recommendations for Other Services       Precautions / Restrictions Precautions Precautions: Other (comment) Precaution Comments: watch O2 sats Restrictions Weight Bearing Restrictions: No    Mobility  Bed Mobility Overal bed mobility: Modified Independent             General bed mobility comments: pt in chair on arrival and end of session  Transfers Overall transfer level: Needs assistance Equipment used: None Transfers: Sit to/from Stand Sit to Stand: Supervision         General transfer comment: supervision for lines and to monitor sats  Ambulation/Gait Ambulation/Gait assistance: Supervision Gait Distance (Feet): 400 Feet Assistive device: Rolling walker (2 wheeled) Gait Pattern/deviations: Step-through pattern;Decreased stride length   Gait velocity interpretation: >2.62 ft/sec, indicative of community ambulatory General Gait Details: cues for posture, breathing technique and standing rests. Pt with 5 rest breaks during gait without SOB. Pt initially on 8L to maintain  sats at 94% with gradual titration to 4L with gait with SpO2 92% but pt will rapidly drop to 86% without symptom and unclear if accurate pleth and reading with gait with HR 63-72   Stairs             Wheelchair Mobility    Modified Rankin (Stroke Patients Only)       Balance Overall balance assessment: No apparent balance deficits (not formally assessed)                                          Cognition Arousal/Alertness: Awake/alert Behavior During Therapy: WFL for tasks assessed/performed Overall Cognitive Status: Within Functional Limits for tasks assessed                                        Exercises General Exercises - Lower Extremity Long Arc Quad: AROM;Both;Seated;15 reps Hip Flexion/Marching: AROM;Both;Seated;15 reps    General Comments        Pertinent Vitals/Pain Pain Assessment: No/denies pain    Home Living                      Prior Function            PT Goals (current goals can now be found in the care plan section) Acute Rehab PT Goals Patient Stated Goal: for my breathing to get better Progress towards PT goals:  Progressing toward goals    Frequency    Min 3X/week      PT Plan Current plan remains appropriate    Co-evaluation              AM-PAC PT "6 Clicks" Mobility   Outcome Measure  Help needed turning from your back to your side while in a flat bed without using bedrails?: None Help needed moving from lying on your back to sitting on the side of a flat bed without using bedrails?: None Help needed moving to and from a bed to a chair (including a wheelchair)?: A Little Help needed standing up from a chair using your arms (e.g., wheelchair or bedside chair)?: A Little Help needed to walk in hospital room?: A Little Help needed climbing 3-5 steps with a railing? : A Little 6 Click Score: 20    End of Session Equipment Utilized During Treatment: Oxygen Activity  Tolerance: Patient tolerated treatment well Patient left: in chair;with call bell/phone within reach;with chair alarm set Nurse Communication: Mobility status PT Visit Diagnosis: Other abnormalities of gait and mobility (R26.89);Muscle weakness (generalized) (M62.81)     Time: 1941-7408 PT Time Calculation (min) (ACUTE ONLY): 32 min  Charges:  $Gait Training: 8-22 mins $Therapeutic Activity: 8-22 mins                     Kristiana Jacko P, PT Acute Rehabilitation Services Pager: [phone] Office: [place] 08/12/2020, 1:57 PM

## 2020-08-13 ENCOUNTER — Other Ambulatory Visit: Payer: Self-pay

## 2020-08-13 LAB — BASIC METABOLIC PANEL
Anion gap: 9 (ref 5–15)
BUN: 24 mg/dL — ABNORMAL HIGH (ref 8–23)
CO2: 29 mmol/L (ref 22–32)
Calcium: 8.8 mg/dL — ABNORMAL LOW (ref 8.9–10.3)
Chloride: 107 mmol/L (ref 98–111)
Creatinine, Ser: 1.7 mg/dL — ABNORMAL HIGH (ref 0.61–1.24)
GFR calc Af Amer: 41 mL/min — ABNORMAL LOW (ref >60)
GFR calc non Af Amer: 36 mL/min — ABNORMAL LOW (ref >60)
Glucose, Bld: 104 mg/dL — ABNORMAL HIGH (ref 70–99)
Potassium: 3.4 mmol/L — ABNORMAL LOW (ref 3.5–5.1)
Sodium: 145 mmol/L (ref 135–145)

## 2020-08-13 LAB — CBC
HCT: 45.4 % (ref 39.0–52.0)
Hemoglobin: 14.6 g/dL (ref 13.0–17.0)
MCH: 27.9 pg (ref 26.0–34.0)
MCHC: 32.2 g/dL (ref 30.0–36.0)
MCV: 86.6 fL (ref 80.0–100.0)
Platelets: 190 10*3/uL (ref 150–400)
RBC: 5.24 MIL/uL (ref 4.22–5.81)
RDW: 16 % — ABNORMAL HIGH (ref 11.5–15.5)
WBC: 7.7 10*3/uL (ref 4.0–10.5)
nRBC: 0 % (ref 0.0–0.2)

## 2020-08-13 LAB — GLUCOSE, CAPILLARY
Glucose-Capillary: 111 mg/dL — ABNORMAL HIGH (ref 70–99)
Glucose-Capillary: 111 mg/dL — ABNORMAL HIGH (ref 70–99)
Glucose-Capillary: 209 mg/dL — ABNORMAL HIGH (ref 70–99)
Glucose-Capillary: 188 mg/dL — ABNORMAL HIGH (ref 70–99)

## 2020-08-13 LAB — MAGNESIUM: Magnesium: 2 mg/dL (ref 1.7–2.4)

## 2020-08-13 NOTE — Progress Notes (Signed)
Subjective:  Patient reports feeling better and that he was able to sleep last night. Patient reports being able to lay more flat. Patient also feels that his legs are less tight this AM. Patient does not endorse CP nor headaches. Pt reports that he feels fine during his PT and does not feel short of breath.  Objective:  Vital signs in last 24 hours: Vitals:   08/13/20 0159 08/13/20 0551 08/13/20 0742 08/13/20 1152  BP: (!) 150/77 134/67 (!) 149/111 (!) 159/78  Pulse: 60 (!) 56 69 61  Resp: 19 20 18 16   Temp: 99.2 F (37.3 C) 98 F (36.7 C) 97.9 F (36.6 C) 98.4 F (36.9 C)  TempSrc: Oral Oral Oral Oral  SpO2: 95% 91% 93% 90%  Weight: 87 kg     Height:       Physical Exam Constitutional:      Appearance: He is well-developed.  Neck:     Vascular: JVD present.  Cardiovascular:     Rate and Rhythm: Normal rate and regular rhythm.  Pulmonary:     Breath sounds: No wheezing.     Comments: Patient with increase work of breathing Abdominal:     General: Bowel sounds are normal.     Palpations: Abdomen is soft.  Musculoskeletal:     Cervical back: Normal range of motion and neck supple.     Comments: 1+ bilateral pitting edema  Neurological:     Mental Status: He is alert.     Assessment/Plan:  Active Problems:   AKI (acute kidney injury) ([place])   Acute exacerbation of CHF (congestive heart failure) ([place])   Peripheral edema  [name] is a 86 year old make whohas aPMHx of CHF, CKD, T2DM, CAD with MI, and HTN who presenting with worsening dyspnea and lower extremity swellingdespite increase in home diuretics sincedischarge 8/18/2021for acute exacerbation of CHF. Patient admitted9/2/20201forfluid overload and acute kidney injury in the setting of CKD.  #Acute Exacerbation of Chronic Diastolic Heart Failure- Patientreportingimproved  edema edema in feet now nearly resolved. On physical exam edema is significantly improved with improved JVD as well. Now on 4 L [place].  Desatted to 76% when ambulating and required 6L, resolved with rest. Diuresed 5L today he will likely benefit from another day of diuresis given continues SOB. - Continue Carvedilol to 6.25 mg bid  - Continue IV lasix 120 mg BID - Potassium 3.4 today, repleted - Follow up on Mg - Strict I/O, fluid and salt restriction  #Acute Kidney Injury with CKD.  His prior baseline kidney function is CR of 1.7 and GFR of 40. His Cr 1.7 today. - Continue to avoid nephrotoxic medications when possible - Hold ACEI - Repeat BMP tomorrow.  #History of HTN -BP elevated today 163/77, increase carvedilol for EKG changes may also improve BP control -Hydralazine 10 mg PRN, BP goal<180/110 - Continue amlodipine 10 mg QD - Holding benazopril in setting of AKI  #History of CADand HLD -Continue rosuvastatin 20 mg and zetia 10 mg - Continue ASA 81 mg  #History of Type 2 DM- Patient had episode of hypoglycemia (40s) overnight that improved with juice. I will decrease his lantus to 10 units daily and titrated as needed.  - Lantus 10units daily and SSI  Diet:Heart Healthy QQP:YPPJ KDT:OIZTIWP Code:Full PT/OT recs:Home health PT  Prior to Admission Living Arrangement: Home with wife Anticipated Discharge Location: Home with [facility] Barriers to Discharge:oxygen requirements Dispo: Anticipated discharge in approximately 1 day(s).   Joseph Beard, MD 08/13/2020,  3:21 PM Pager: [phone] After 5pm on weekdays and 1pm on weekends: On Call pager [phone]

## 2020-08-13 NOTE — Progress Notes (Signed)
SATURATION QUALIFICATIONS: (This note is used to comply with regulatory documentation for home oxygen)  Patient Saturations on Room Air at Rest = n/a%  Patient Saturations on Room Air while Ambulating = n/a%  Patient Saturations on 4 Liters of oxygen while Ambulating = 78%  Please briefly explain why patient needs home oxygen: It took 6 liters of oxygen for patient to ambulate with me in hallway and he was still winded. When got back to room he was 84% on the 6 liters when he rested and sat down he normalized his o2 to 91%. He definitely needs oxygen for ADL's.

## 2020-08-13 NOTE — TOC Transition Note (Signed)
Transition of Care [facility]) - CM/SW Discharge Note   Patient Details  Name: MACKAY HANAUER MRN: [id number] Date of Birth: Nov 06, 1932  [facility]) CM/SW Contact:  Zenon Mayo, RN Phone Number: 08/13/2020, 9:53 AM   Clinical Narrative:    Patient for possible discharge today, patient is active with [facility],  Will add HHPT to services per pt eval. Patient gave NCM permission to speak with his wife.  NCM spoke with wife and she states yes they would like to continue with [facility] for [facility] services.  He has a walker at home and he has home oxygen with [employer], 2 liters.  NCM informed her to bring oxygen tank when she transports patient  Home, she states she would.   Final next level of care: [place] Barriers to Discharge: No Barriers Identified   Patient Goals and CMS Choice Patient states their goals for this hospitalization and ongoing recovery are:: get better CMS Medicare.gov Compare Post Acute Care list provided to:: Patient Represenative (must comment) Choice offered to / list presented to : Spouse  Discharge Placement                       Discharge Plan and Services                  DME Agency: NA       HH Arranged: RN, PT HH Agency: [place] Date [place]: 08/13/20 Time [place]: [phone] Representative spoke with at [place]: [place] ([place]) Interventions     Readmission Risk Interventions No flowsheet data found.

## 2020-08-14 ENCOUNTER — Other Ambulatory Visit: Payer: Self-pay | Admitting: Student

## 2020-08-14 LAB — CBC
HCT: 44.8 % (ref 39.0–52.0)
Hemoglobin: 14.2 g/dL (ref 13.0–17.0)
MCH: 27.5 pg (ref 26.0–34.0)
MCHC: 31.7 g/dL (ref 30.0–36.0)
MCV: 86.8 fL (ref 80.0–100.0)
Platelets: 191 10*3/uL (ref 150–400)
RBC: 5.16 MIL/uL (ref 4.22–5.81)
RDW: 15.9 % — ABNORMAL HIGH (ref 11.5–15.5)
WBC: 7.1 10*3/uL (ref 4.0–10.5)
nRBC: 0 % (ref 0.0–0.2)

## 2020-08-14 LAB — BASIC METABOLIC PANEL
Anion gap: 6 (ref 5–15)
BUN: 24 mg/dL — ABNORMAL HIGH (ref 8–23)
CO2: 30 mmol/L (ref 22–32)
Calcium: 8.8 mg/dL — ABNORMAL LOW (ref 8.9–10.3)
Chloride: 108 mmol/L (ref 98–111)
Creatinine, Ser: 1.67 mg/dL — ABNORMAL HIGH (ref 0.61–1.24)
GFR calc Af Amer: 42 mL/min — ABNORMAL LOW (ref >60)
GFR calc non Af Amer: 36 mL/min — ABNORMAL LOW (ref >60)
Glucose, Bld: 165 mg/dL — ABNORMAL HIGH (ref 70–99)
Potassium: 3.9 mmol/L (ref 3.5–5.1)
Sodium: 144 mmol/L (ref 135–145)

## 2020-08-14 LAB — GLUCOSE, CAPILLARY
Glucose-Capillary: 135 mg/dL — ABNORMAL HIGH (ref 70–99)
Glucose-Capillary: 89 mg/dL (ref 70–99)
Glucose-Capillary: 122 mg/dL — ABNORMAL HIGH (ref 70–99)

## 2020-08-14 MED ORDER — INSULIN ASPART 100 UNIT/ML ~~LOC~~ SOLN: 0.0000 [IU] | Freq: Three times a day (TID) | SUBCUTANEOUS | Status: DC

## 2020-08-14 MED ORDER — CARVEDILOL 6.25 MG PO TABS: 6.2500 mg | ORAL_TABLET | Freq: Two times a day (BID) | ORAL | 0 refills | Status: DC

## 2020-08-14 NOTE — Care Management Important Message (Signed)
Important Message  Patient Details  Name: [name] MRN: [id number] Date of Birth: 05-Jul-1932   Medicare Important Message Given:  Yes     Shelda Altes 08/14/2020, 11:56 AM

## 2020-08-14 NOTE — Progress Notes (Signed)
Physical Therapy Treatment Patient Details Name: [name] MRN: [id number] DOB: [birth year] Today's Date: 08/14/2020    History of Present Illness Pt is an 86 y.o. male admitted 08/08/20 with SOB, worse with exertion, and RLE sores. Admitted 8/13 -8/18 with similar symptoms. PMH includes HTN, CAD, CKD, DM, wears 2L O2 baseline.    PT Comments    Pt admitted with above diagnosis. Pt was able to ambulate with 4LO2 with sats >90% without device and with good stability overall.  Pt needs standing rest breaks but does well overall and feels he is close to baseline.  Pt currently with functional limitations due to balance and endurance deficits. Pt will benefit from skilled PT to increase their independence and safety with mobility to allow discharge to the venue listed below.     Follow Up Recommendations  Home health PT;Supervision for mobility/OOB     Equipment Recommendations  Rolling walker with 5" wheels    Recommendations for Other Services       Precautions / Restrictions Precautions Precautions: Other (comment) Precaution Comments: watch O2 sats Restrictions Weight Bearing Restrictions: No    Mobility  Bed Mobility Overal bed mobility: Modified Independent Bed Mobility: Supine to Sit     Supine to sit: Supervision        Transfers Overall transfer level: Needs assistance Equipment used: None Transfers: Sit to/from Stand Sit to Stand: Supervision Stand pivot transfers: Supervision       General transfer comment: supervision for lines and to monitor sats  Ambulation/Gait Ambulation/Gait assistance: Supervision Gait Distance (Feet): 425 Feet Assistive device: Rolling walker (2 wheeled) Gait Pattern/deviations: Step-through pattern;Decreased stride length Gait velocity: Decreased Gait velocity interpretation: >2.62 ft/sec, indicative of community ambulatory General Gait Details: cues for posture, breathing technique and standing rests. Pt with standing rest  breaks during gait without SOB. Pt on 4LO2 and sats >90%.    Stairs             Wheelchair Mobility    Modified Rankin (Stroke Patients Only)       Balance Overall balance assessment: No apparent balance deficits (not formally assessed) Sitting-balance support: Feet supported;No upper extremity supported Sitting balance-Leahy Scale: Good     Standing balance support: Single extremity supported;No upper extremity supported;During functional activity Standing balance-Leahy Scale: Fair Standing balance comment: can static stand without support, reaches out to steady himself with dynamic motion                             Cognition Arousal/Alertness: Awake/alert Behavior During Therapy: WFL for tasks assessed/performed Overall Cognitive Status: Within Functional Limits for tasks assessed                                        Exercises General Exercises - Lower Extremity Long Arc Quad: AROM;Both;Seated;15 reps Hip Flexion/Marching: AROM;Both;Seated;15 reps    General Comments        Pertinent Vitals/Pain Pain Assessment: No/denies pain    Home Living                      Prior Function            PT Goals (current goals can now be found in the care plan section) Acute Rehab PT Goals Patient Stated Goal: for my breathing to get better Progress towards PT goals: Progressing toward  goals    Frequency    Min 3X/week      PT Plan Current plan remains appropriate    Co-evaluation              AM-PAC PT "6 Clicks" Mobility   Outcome Measure  Help needed turning from your back to your side while in a flat bed without using bedrails?: None Help needed moving from lying on your back to sitting on the side of a flat bed without using bedrails?: None Help needed moving to and from a bed to a chair (including a wheelchair)?: A Little Help needed standing up from a chair using your arms (e.g., wheelchair or bedside  chair)?: A Little Help needed to walk in hospital room?: A Little Help needed climbing 3-5 steps with a railing? : A Little 6 Click Score: 20    End of Session Equipment Utilized During Treatment: Oxygen;Gait belt Activity Tolerance: Patient tolerated treatment well Patient left: in chair;with call bell/phone within reach;with chair alarm set Nurse Communication: Mobility status PT Visit Diagnosis: Other abnormalities of gait and mobility (R26.89);Muscle weakness (generalized) (M62.81)     Time: 2091-9802 PT Time Calculation (min) (ACUTE ONLY): 14 min  Charges:  $Gait Training: 8-22 mins                     Naif Alabi W,PT [place] Pager:  [phone]  Office:  [place] 08/14/2020, 1:07 PM

## 2020-08-14 NOTE — Progress Notes (Signed)
   Subjective:  Patient's daughter recently passed 2 days ago. Patient is tearful and worried about family. Otherwise he is doing well with symptoms of shortness of breath. He was able to ambulate with PT without becoming SOB. He states he is ready to go. He has follow up with PCP in 1 week.   Objective:  Vital signs in last 24 hours: Vitals:   08/14/20 0738 08/14/20 0800 08/14/20 0921 08/14/20 1108  BP:  (!) 153/85  (!) 153/74  Pulse:  (!) 56 65 61  Resp:  20  20  Temp:  98.5 F (36.9 C)  98.4 F (36.9 C)  TempSrc:  Oral  Oral  SpO2: 90% 95%  94%  Weight:      Height:       Physical Exam Constitutional:      Appearance: He is well-developed.     Comments: Tearful  Neck:     Vascular: No JVD.  Cardiovascular:     Rate and Rhythm: Regular rhythm. Bradycardia present.     Comments: Pitting edema bilaterally significantly improved Pulmonary:     Effort: Pulmonary effort is normal. No tachypnea.     Breath sounds: Normal breath sounds.  Abdominal:     General: Bowel sounds are normal.     Palpations: Abdomen is soft.  Musculoskeletal:     Cervical back: Neck supple.  Neurological:     Mental Status: He is alert.    Assessment/Plan:  Active Problems:   AKI (acute kidney injury) ([place])   Acute exacerbation of CHF (congestive heart failure) ([place])   Peripheral edema  Mr Ayyad is a 86 year old make whohas aPMHx of CHF, CKD, T2DM, CAD with MI, and HTN who presenting with worsening dyspnea and lower extremity swellingdespite increase in home diuretics sincedischarge 8/18/2021for acute exacerbation of CHF. Patient admitted9/2/20201forfluid overload and acute kidney injury in the setting of CKD.  #Acute Exacerbation of Chronic Diastolic Heart Failure- Patientreportingimproved edema in feet now nearly resolved. On physical exam edema is significantly improved with resolution of JVD as well. Now on 4 L [place]. Now able to ambulate on 4L maintaining O2 sat of 94%. Diureces  down to 85.6 kg -transition to home diuretics -plan for follow up with PCP in 1 week with [facility] and PT  #PVC Patient with significant PVC burden with continue on carvedilol 6.25 mg. Patient has been bradycardic but fairly asymptomatic and would benefit from continue therapy.   #Acute Kidney Injury with CKD.  Hispriorbaseline kidney function is CR of 1.7 and GFR of 40. His Cr 1.67 today and back around baseline  #History of HTN -BP elevated today 153/83. Will discharge home on home med of  - Amlodipine 10 mg QD and benazopril 40 mg daily  #History of CADand HLD -Continue rosuvastatin 20 mg and zetia 10 mg - Continue ASA 81 mg  #History of Type 2 DM- Tolerated 10 units of lantus daily. Will discharge on home medications  Diet:Heart Healthy IOE:VOJJ KKX:FGHWEXH Code:Full PT/OT recs:Home health PT  Prior to Admission Living Arrangement: Home with wife Anticipated Discharge Location: Home with [facility] Barriers to Discharge:oxygen requirements Dispo: Anticipated discharge today  Iona Beard, MD 08/13/2020, 3:21 PM Pager: [phone] After 5pm on weekdays and 1pm on weekends: On Call pager [phone]

## 2020-08-14 NOTE — Consult Note (Signed)
   [facility] CM Inpatient Consult   08/14/2020  [name] June 20, 1932 [id number]  [facility] [ACO] Patient: Medicare NextGen   Patient screened for less than 30 days unplanned readmission score and for readmission hospitalization.  Medical record review done to check if potential [place] Management service needs.  Review of patient's medical record reveals patient is recommended to return home with home health. Recently with [facility] home health.  Patient asleep on round.   Primary Care Provider is  [name], MD  this provider is listed to provide the transition of care [TOC] for post hospital follow up.   Plan:  Will follow up for Heart Failure management needs.  Continue to follow progress and disposition to assess for post hospital care management needs.    Please place a [facility] Care Management consult as appropriate and for questions contact:   Natividad Brood, RN BSN [facility] Liaison  [phone] business mobile phone Toll free office [phone]  Fax number: [phone] [email or web address]

## 2020-08-14 NOTE — Plan of Care (Signed)
  Problem: Education: Goal: Knowledge of General Education information will improve Description: Including pain rating scale, medication(s)/side effects and non-pharmacologic comfort measures Outcome: Adequate for Discharge   Problem: Health Behavior/Discharge Planning: Goal: Ability to manage health-related needs will improve Outcome: Adequate for Discharge   Problem: Clinical Measurements: Goal: Ability to maintain clinical measurements within normal limits will improve Outcome: Adequate for Discharge Goal: Will remain free from infection Outcome: Adequate for Discharge Goal: Diagnostic test results will improve Outcome: Adequate for Discharge Goal: Respiratory complications will improve Outcome: Adequate for Discharge Goal: Cardiovascular complication will be avoided Outcome: Adequate for Discharge   Problem: Activity: Goal: Risk for activity intolerance will decrease Outcome: Adequate for Discharge   Problem: Nutrition: Goal: Adequate nutrition will be maintained Outcome: Adequate for Discharge   Problem: Coping: Goal: Level of anxiety will decrease Outcome: Adequate for Discharge   Problem: Elimination: Goal: Will not experience complications related to bowel motility Outcome: Adequate for Discharge Goal: Will not experience complications related to urinary retention Outcome: Adequate for Discharge   Problem: Pain Managment: Goal: General experience of comfort will improve Outcome: Adequate for Discharge   Problem: Safety: Goal: Ability to remain free from injury will improve Outcome: Adequate for Discharge   Problem: Skin Integrity: Goal: Risk for impaired skin integrity will decrease Outcome: Adequate for Discharge   Problem: Education: Goal: Ability to demonstrate management of disease process will improve Outcome: Adequate for Discharge Goal: Ability to verbalize understanding of medication therapies will improve Outcome: Adequate for Discharge    Problem: Activity: Goal: Capacity to carry out activities will improve Outcome: Adequate for Discharge   Problem: Cardiac: Goal: Ability to achieve and maintain adequate cardiopulmonary perfusion will improve Outcome: Adequate for Discharge   Problem: Education: Goal: Knowledge of General Education information will improve Description: Including pain rating scale, medication(s)/side effects and non-pharmacologic comfort measures Outcome: Adequate for Discharge   Problem: Health Behavior/Discharge Planning: Goal: Ability to manage health-related needs will improve Outcome: Adequate for Discharge   Problem: Clinical Measurements: Goal: Ability to maintain clinical measurements within normal limits will improve Outcome: Adequate for Discharge Goal: Will remain free from infection Outcome: Adequate for Discharge Goal: Diagnostic test results will improve Outcome: Adequate for Discharge Goal: Respiratory complications will improve Outcome: Adequate for Discharge Goal: Cardiovascular complication will be avoided Outcome: Adequate for Discharge   Problem: Activity: Goal: Risk for activity intolerance will decrease Outcome: Adequate for Discharge   Problem: Nutrition: Goal: Adequate nutrition will be maintained Outcome: Adequate for Discharge   Problem: Coping: Goal: Level of anxiety will decrease Outcome: Adequate for Discharge   Problem: Elimination: Goal: Will not experience complications related to bowel motility Outcome: Adequate for Discharge Goal: Will not experience complications related to urinary retention Outcome: Adequate for Discharge   Problem: Pain Managment: Goal: General experience of comfort will improve Outcome: Adequate for Discharge   Problem: Safety: Goal: Ability to remain free from injury will improve Outcome: Adequate for Discharge   Problem: Skin Integrity: Goal: Risk for impaired skin integrity will decrease Outcome: Adequate for  Discharge

## 2020-08-14 NOTE — Progress Notes (Signed)
D/C instructions given and reviewed. No questions voiced at this time, encouraged to call with any concerns. Tele and IV removed, tolerated well.

## 2020-08-15 ENCOUNTER — Ambulatory Visit (INDEPENDENT_AMBULATORY_CARE_PROVIDER_SITE_OTHER): Payer: Medicare Other | Admitting: Internal Medicine

## 2020-08-15 ENCOUNTER — Encounter: Payer: Self-pay | Admitting: Internal Medicine

## 2020-08-15 ENCOUNTER — Other Ambulatory Visit: Payer: Self-pay

## 2020-08-15 VITALS — BP 130/60 | HR 60 | Temp 97.0°F | Ht 69.0 in | Wt 192.8 lb

## 2020-08-15 DIAGNOSIS — N1831 Chronic kidney disease, stage 3a: Secondary | ICD-10-CM

## 2020-08-15 DIAGNOSIS — Z951 Presence of aortocoronary bypass graft: Secondary | ICD-10-CM

## 2020-08-15 DIAGNOSIS — I5033 Acute on chronic diastolic (congestive) heart failure: Secondary | ICD-10-CM | POA: Diagnosis not present

## 2020-08-15 DIAGNOSIS — I4891 Unspecified atrial fibrillation: Secondary | ICD-10-CM | POA: Diagnosis not present

## 2020-08-15 MED ORDER — APIXABAN 2.5 MG PO TABS: 2.5000 mg | ORAL_TABLET | Freq: Two times a day (BID) | ORAL | 6 refills | Status: AC

## 2020-08-15 NOTE — Progress Notes (Signed)
OFFICE NOTE  Chief Complaint:  Follow-up hospitalization  Primary Care Physician: Joseph Gravel, MD  HPI:  [name] is a 86 y.o. male with a past medial history significant for chronic diastolic heart failure and bilateral pleural effusions.  LVEF is remained stable around 55 to 60%.  He also has a history of mixed pulmonary venous and arterial hypertension and has been seen in pulmonary by Dr. Lake Bells.  He had a moderate sized right pleural effusion which resolved after more aggressive diuresis.  He also has a history of coronary disease and had CABG in 1996.  In addition he had chronic kidney disease however this is improved with creatinine now ranging between 1.5-1.7.  There is a history of hypertension, dyslipidemia, right bundle branch block and obstructive sleep apnea, which he says he is compliant on nightly CPAP.  When I last saw him 6 months ago his weight is unchanged from then.  He told me that he has no new complaints.  He denies chest pain, worsening shortness of breath or other associated symptoms.  He was mowing his lawn today.  He says he can mow his lawn for at least a half an hour.  He says he typically breaks it up in half because of the heat, but does not have any worsening shortness of breath or heart failure symptoms with this.  09/06/2019  [name] returns today for follow-up of a recent telehealth visit in April 2020.  At that time he had stable chronic diastolic heart failure.  In June 2020 he was seen by Joseph Ransom, PA-C in the office, at the time of seem to have some increased swelling and weight gain.  His Lasix was increased to 80 mg twice daily from 60 mg twice daily and he reports some improvement of his symptoms.  Since then his weight has been fairly stable.  He was seen again by pulmonary in July and reports improved symptoms from his diuretic.  Today he says he is doing well.  Denies any chest pain or worsening shortness of breath.  Weight has been fairly stable.   Recent labs show total cholesterol 113, triglycerides 254, HDL 32 and LDL 30 with hemoglobin A1c of 7.6.  EKG shows a junctional rhythm at 56 with lateral ST and T wave changes.  08/15/2020  [name] is seen today for annual follow-up, however he was just admitted and discharged from the hospital yesterday with acute exacerbation of diastolic heart failure and acute kidney injury.  He had reported increasing lower extremity edema and shortness of breath and was admitted on August 08, 2020 for volume overload.  He was subsequently diuresed down to 86 kg and transition back to home diuretics.  He was able to ambulate requiring 4 L of oxygen.  He was noted to be bradycardic with heart rates in the 40s although thought to be asymptomatic and his beta-blockers were not adjusted.  Today an EKG was performed which shows new onset atrial fibrillation and a heart rate of 60.  He is unaware of this.  This is not a known diagnosis for him.  Overall he still reports his breathing is much better.  PMHx:  Past Medical History:  Diagnosis Date  . Chronic kidney disease    tumor right kidney  . Coronary artery disease   . Diabetes mellitus    type 2  . Hypertension   . Myocardial infarction ([name])    1997  . Right renal mass  Past Surgical History:  Procedure Laterality Date  . CARDIAC CATHETERIZATION  1996  . CORONARY ARTERY BYPASS GRAFT    . DOPPLER ECHOCARDIOGRAPHY  2011  . HERNIA REPAIR    . IR GENERIC HISTORICAL  12/09/2016   IR RADIOLOGIST EVAL & MGMT 12/09/2016 Joseph Mariscal, MD [facility] INTERV RAD  . ROBOTIC ASSITED PARTIAL NEPHRECTOMY Right 02/19/2017   Procedure: XI ROBOTIC ASSITED PARTIAL NEPHRECTOMY;  Surgeon: Joseph Hughs, MD;  Location: [facility];  Service: Urology;  Laterality: Right;    FAMHx:  Family History  Problem Relation Age of Onset  . Diabetes Mother   . Diabetes Sister     SOCHx:   reports that he quit smoking about 23 years ago. His smoking use included cigarettes. He  has a 13.50 pack-year smoking history. He has never used smokeless tobacco. He reports that he does not drink alcohol and does not use drugs.  ALLERGIES:  No Known Allergies  ROS: Pertinent items noted in HPI and remainder of comprehensive ROS otherwise negative.  HOME MEDS: Current Outpatient Medications on File Prior to Visit  Medication Sig Dispense Refill  . acetaminophen (TYLENOL) 325 MG tablet Take 650 mg by mouth every 6 (six) hours as needed (for pain.).    Marland Kitchen aspirin 81 MG EC tablet Take 81 mg by mouth daily. Swallow whole.    . benazepril (LOTENSIN) 40 MG tablet Take 40 mg by mouth daily.  0  . carvedilol (COREG) 6.25 MG tablet Take 1 tablet (6.25 mg total) by mouth 2 (two) times daily with a meal. 60 tablet 0  . cloNIDine (CATAPRES) 0.1 MG tablet Take 0.1 mg by mouth 2 (two) times daily.    Marland Kitchen ezetimibe (ZETIA) 10 MG tablet Take 10 mg by mouth daily.    . furosemide (LASIX) 20 MG tablet Take 4 tablets (80 mg total) by mouth 2 (two) times daily. 240 tablet 0  . HUMALOG KWIKPEN 100 UNIT/ML KiwkPen Inject 6-8 Units into the skin 3 (three) times daily.   0  . Icosapent Ethyl (VASCEPA) 1 g CAPS Take 2 capsules (2 g total) by mouth 2 (two) times daily. 120 capsule 11  . LANTUS SOLOSTAR 100 UNIT/ML Solostar Pen Inject 18 Units into the skin every evening.     . potassium chloride (KLOR-CON) 10 MEQ tablet Take 2 tablets (20 mEq total) by mouth 2 (two) times daily. 120 tablet 0  . rosuvastatin (CRESTOR) 20 MG tablet TAKE 1 TABLET BY MOUTH EVERY DAY (Patient taking differently: Take 20 mg by mouth daily. ) 90 tablet 1  . SYNTHROID 100 MCG tablet Take 100 mcg by mouth daily.   0   No current facility-administered medications on file prior to visit.    LABS/IMAGING: Results for orders placed or performed during the hospital encounter of 08/08/20 (from the past 48 hour(s))  Glucose, capillary     Status: Abnormal   Collection Time: 08/13/20  4:14 PM  Result Value Ref Range    Glucose-Capillary 209 (H) 70 - 99 mg/dL    Comment: Glucose reference range applies only to samples taken after fasting for at least 8 hours.  Glucose, capillary     Status: Abnormal   Collection Time: 08/13/20  9:29 PM  Result Value Ref Range   Glucose-Capillary 188 (H) 70 - 99 mg/dL    Comment: Glucose reference range applies only to samples taken after fasting for at least 8 hours.   Comment 1 Notify RN    Comment 2 Document in Chart  CBC     Status: Abnormal   Collection Time: 08/14/20  3:52 AM  Result Value Ref Range   WBC 7.1 4.0 - 10.5 K/uL   RBC 5.16 4.22 - 5.81 MIL/uL   Hemoglobin 14.2 13.0 - 17.0 g/dL   HCT 44.8 39 - 52 %   MCV 86.8 80.0 - 100.0 fL   MCH 27.5 26.0 - 34.0 pg   MCHC 31.7 30.0 - 36.0 g/dL   RDW 15.9 (H) 11.5 - 15.5 %   Platelets 191 150 - 400 K/uL   nRBC 0.0 0.0 - 0.2 %    Comment: Performed at [facility], [place] [address], [place], [place] [address]  Basic metabolic panel     Status: Abnormal   Collection Time: 08/14/20  3:52 AM  Result Value Ref Range   Sodium 144 135 - 145 mmol/L   Potassium 3.9 3.5 - 5.1 mmol/L   Chloride 108 98 - 111 mmol/L   CO2 30 22 - 32 mmol/L   Glucose, Bld 165 (H) 70 - 99 mg/dL    Comment: Glucose reference range applies only to samples taken after fasting for at least 8 hours.   BUN 24 (H) 8 - 23 mg/dL   Creatinine, Ser 1.67 (H) 0.61 - 1.24 mg/dL   Calcium 8.8 (L) 8.9 - 10.3 mg/dL   GFR calc non Af Amer 36 (L) >60 mL/min   GFR calc Af Amer 42 (L) >60 mL/min   Anion gap 6 5 - 15    Comment: Performed at [facility] [address] [place], [place] [address]  Glucose, capillary     Status: Abnormal   Collection Time: 08/14/20  5:56 AM  Result Value Ref Range   Glucose-Capillary 135 (H) 70 - 99 mg/dL    Comment: Glucose reference range applies only to samples taken after fasting for at least 8 hours.   Comment 1 Notify RN   Glucose, capillary     Status: None   Collection Time: 08/14/20 11:05 AM  Result  Value Ref Range   Glucose-Capillary 89 70 - 99 mg/dL    Comment: Glucose reference range applies only to samples taken after fasting for at least 8 hours.   Comment 1 Notify RN    Comment 2 Document in Chart   Glucose, capillary     Status: Abnormal   Collection Time: 08/14/20  4:33 PM  Result Value Ref Range   Glucose-Capillary 122 (H) 70 - 99 mg/dL    Comment: Glucose reference range applies only to samples taken after fasting for at least 8 hours.   Comment 1 Notify RN    Comment 2 Document in Chart    No results found.  LIPID PANEL:    Component Value Date/Time   CHOL 95 07/21/2020 0646   TRIG 76 07/21/2020 0646   HDL 28 (L) 07/21/2020 0646   CHOLHDL 3.4 07/21/2020 0646   VLDL 15 07/21/2020 0646   LDLCALC 52 07/21/2020 0646     WEIGHTS: Wt Readings from Last 3 Encounters:  08/15/20 192 lb 12.8 oz (87.5 kg)  08/14/20 188 lb 11.2 oz (85.6 kg)  07/29/20 198 lb 3.2 oz (89.9 kg)    VITALS: BP 130/60   Pulse 60   Temp (!) 97 F (36.1 C)   Ht 5\' 9"  (1.753 m)   Wt 192 lb 12.8 oz (87.5 kg)   SpO2 (!) 79%   BMI 28.47 kg/m   EXAM: General appearance: alert and no  distress Neck: no carotid bruit, no JVD and thyroid not enlarged, symmetric, no tenderness/mass/nodules Lungs: clear to auscultation bilaterally Heart: regular rate and rhythm Abdomen: soft, non-tender; bowel sounds normal; no masses,  no organomegaly Extremities: extremities normal, atraumatic, no cyanosis or edema Pulses: 2+ and symmetric Skin: Skin color, texture, turgor normal. No rashes or lesions Neurologic: Grossly normal Psych: Pleasant  EKG: Atrial fibrillation at 60, inferior T wave changes-personally reviewed  ASSESSMENT: 1. New onset atrial fibrillation - CHADSVASC score of 5 2. Recent acute on chronicdiastolic congestive heart failure(EF 60-65%, 07/2020, grade 2 DD, moderate RV dysfunction, severe pulm HTN at 65 mmHg) 3. Mixed pulmonary venous/arterial hypertension 4. Moderate sized  pericardial effusion - resolved 5. CAD status post CABG 1996 6. CKD with improving creatinine now 1.54 (as high as 3.84, 2.87, 2.36 recently) 7. Hypertension 8. Dyslipidemia 9. RBBB 10. OSA on CPAP  11.Type 2 diabetes-hemoglobin A1c 7.6  PLAN: 1.   Mr. Heidecker was just hospitalized for acute on chronic diastolic congestive heart failure.  Repeat echo showed LVEF 60 to 17%, grade 2 diastolic dysfunction with high filling pressures and severe pulmonary hypertension with an RVSP of 52mmHg.  Since discharge he is better and able to ambulate on supplemental oxygen.  EKG performed today for arrhythmias shows that he is in new onset atrial fibrillation.  This was not noted on EKGs during his hospitalization where he clearly had a marked sinus bradycardia with heart rates in the 40s.  At this point he remains rate controlled and is asymptomatic however is at risk for heart failure with a CHA2DS2-VASc score of at least 5.  Based on this I would recommend anticoagulation however due to his chronic kidney disease and age in the 64s would recommend lower dose Eliquis 2.5 mg twice daily.  We will stop aspirin.  Follow-up with me in 1 month.  If there is persistent atrial fibrillation, we may consider attempt at cardioversion as the A. fib is likely exacerbating his diastolic heart failure.  Pixie Casino, MD, [facility], [place] Director of the [facility] Lipid Disorders &  Cardiovascular Risk Reduction Clinic Diplomate of the American Board of Clinical Lipidology Attending Cardiologist  Direct Dial: [phone]  Fax: [phone]  Website:  [email or web address] 08/15/2020, 2:59 PM

## 2020-08-15 NOTE — Patient Instructions (Addendum)
Medication Instructions:   Stop taking Aspirin 81 mg   Start  Taking 2.5 mg Eliquis  One tablet twice a day  *If you need a refill on your cardiac medications before your next appointment, please call your pharmacy*   Lab Work: Not needed   Testing/Procedures: Not needed   Follow-Up: At [facility], you and your health needs are our priority.  As part of our continuing mission to provide you with exceptional heart care, we have created designated Provider Care Teams.  These Care Teams include your primary Cardiologist (physician) and Advanced Practice Providers (APPs -  Physician Assistants and Nurse Practitioners) who all work together to provide you with the care you need, when you need it.  We recommend signing up for the patient portal called "MyChart".  Sign up information is provided on this After Visit Summary.  MyChart is used to connect with patients for Virtual Visits (Telemedicine).  Patients are able to view lab/test results, encounter notes, upcoming appointments, etc.  Non-urgent messages can be sent to your provider as well.   To learn more about what you can do with MyChart, go to [email or web address].    Your next appointment:   1 month(s)  The format for your next appointment:   In Person  Provider:   K. Mali Hilty, MD   Other Instructions    Atrial Fibrillation  Atrial fibrillation is a type of heartbeat that is irregular or fast. If you have this condition, your heart beats without any order. This makes it hard for your heart to pump blood in a normal way. Atrial fibrillation may come and go, or it may become a long-lasting problem. If this condition is not treated, it can put you at higher risk for stroke, heart failure, and other heart problems. What are the causes? This condition may be caused by diseases that damage the heart. They include:  High blood pressure.  Heart failure.  Heart valve disease.  Heart surgery. Other causes  include:  Diabetes.  Thyroid disease.  Being overweight.  Kidney disease. Sometimes the cause is not known. What increases the risk? You are more likely to develop this condition if:  You are older.  You smoke.  You exercise often and very hard.  You have a family history of this condition.  You are a man.  You use drugs.  You drink a lot of alcohol.  You have lung conditions, such as emphysema, pneumonia, or COPD.  You have sleep apnea. What are the signs or symptoms? Common symptoms of this condition include:  A feeling that your heart is beating very fast.  Chest pain or discomfort.  Feeling short of breath.  Suddenly feeling light-headed or weak.  Getting tired easily during activity.  Fainting.  Sweating. In some cases, there are no symptoms. How is this treated? Treatment for this condition depends on underlying conditions and how you feel when you have atrial fibrillation. They include:  Medicines to: ? Prevent blood clots. ? Treat heart rate or heart rhythm problems.  Using devices, such as a pacemaker, to correct heart rhythm problems.  Doing surgery to remove the part of the heart that sends bad signals.  Closing an area where clots can form in the heart (left atrial appendage). In some cases, your doctor will treat other underlying conditions. Follow these instructions at home: Medicines  Take over-the-counter and prescription medicines only as told by your doctor.  Do not take any new medicines without first talking to your  doctor.  If you are taking blood thinners: ? Talk with your doctor before you take any medicines that have aspirin or NSAIDs, such as ibuprofen, in them. ? Take your medicine exactly as told by your doctor. Take it at the same time each day. ? Avoid activities that could hurt or bruise you. Follow instructions about how to prevent falls. ? Wear a bracelet that says you are taking blood thinners. Or, carry a card  that lists what medicines you take. Lifestyle      Do not use any products that have nicotine or tobacco in them. These include cigarettes, e-cigarettes, and chewing tobacco. If you need help quitting, ask your doctor.  Eat heart-healthy foods. Talk with your doctor about the right eating plan for you.  Exercise regularly as told by your doctor.  Do not drink alcohol.  Lose weight if you are overweight.  Do not use drugs, including cannabis. General instructions  If you have a condition that causes breathing to stop for a short period of time (apnea), treat it as told by your doctor.  Keep a healthy weight. Do not use diet pills unless your doctor says they are safe for you. Diet pills may make heart problems worse.  Keep all follow-up visits as told by your doctor. This is important. Contact a doctor if:  You notice a change in the speed, rhythm, or strength of your heartbeat.  You are taking a blood-thinning medicine and you get more bruising.  You get tired more easily when you move or exercise.  You have a sudden change in weight. Get help right away if:   You have pain in your chest or your belly (abdomen).  You have trouble breathing.  You have side effects of blood thinners, such as blood in your vomit, poop (stool), or pee (urine), or bleeding that cannot stop.  You have any signs of a stroke. "BE FAST" is an easy way to remember the main warning signs: ? B - Balance. Signs are dizziness, sudden trouble walking, or loss of balance. ? E - Eyes. Signs are trouble seeing or a change in how you see. ? F - Face. Signs are sudden weakness or loss of feeling in the face, or the face or eyelid drooping on one side. ? A - Arms. Signs are weakness or loss of feeling in an arm. This happens suddenly and usually on one side of the body. ? S - Speech. Signs are sudden trouble speaking, slurred speech, or trouble understanding what people say. ? T - Time. Time to call  emergency services. Write down what time symptoms started.  You have other signs of a stroke, such as: ? A sudden, very bad headache with no known cause. ? Feeling like you may vomit (nausea). ? Vomiting. ? A seizure. These symptoms may be an emergency. Do not wait to see if the symptoms will go away. Get medical help right away. Call your local emergency services (911 in the [place] Do not drive yourself to the hospital. Summary  Atrial fibrillation is a type of heartbeat that is irregular or fast.  You are at higher risk of this condition if you smoke, are older, have diabetes, or are overweight.  Follow your doctor's instructions about medicines, diet, exercise, and follow-up visits.  Get help right away if you have signs or symptoms of a stroke.  Get help right away if you cannot catch your breath, or you have chest pain or discomfort. This information  is not intended to replace advice given to you by your health care provider. Make sure you discuss any questions you have with your health care provider. Document Revised: 05/17/2019 Document Reviewed: 05/17/2019 Elsevier Patient Education  [place].

## 2020-08-15 NOTE — Discharge Summary (Signed)
Name: [name] MRN: [id number] DOB: Apr 18, 1932 86 y.o. PCP: [name], MD  Date of Admission: 08/08/2020  6:53 PM Date of Discharge: 08/14/2020 Attending Physician: No att. providers found  Discharge Diagnosis: 1. Acute exacerbation of CHF 2. AKI on CKD 3.Venous Stasis Dermatitis with Ulceration  Discharge Medications: Allergies as of 08/14/2020   No Known Allergies     Medication List    STOP taking these medications   amLODipine 5 MG tablet Commonly known as: NORVASC   nadolol 20 MG tablet Commonly known as: CORGARD     TAKE these medications   acetaminophen 325 MG tablet Commonly known as: TYLENOL Take 650 mg by mouth every 6 (six) hours as needed (for pain.).   benazepril 40 MG tablet Commonly known as: LOTENSIN Take 40 mg by mouth daily.   carvedilol 6.25 MG tablet Commonly known as: COREG Take 1 tablet (6.25 mg total) by mouth 2 (two) times daily with a meal.   cloNIDine 0.1 MG tablet Commonly known as: CATAPRES Take 0.1 mg by mouth 2 (two) times daily.   ezetimibe 10 MG tablet Commonly known as: ZETIA Take 10 mg by mouth daily.   furosemide 20 MG tablet Commonly known as: LASIX Take 4 tablets (80 mg total) by mouth 2 (two) times daily.   HumaLOG KwikPen 100 UNIT/ML KwikPen Generic drug: insulin lispro Inject 6-8 Units into the skin 3 (three) times daily.   Lantus SoloStar 100 UNIT/ML Solostar Pen Generic drug: insulin glargine Inject 18 Units into the skin every evening.   potassium chloride 10 MEQ tablet Commonly known as: KLOR-CON Take 2 tablets (20 mEq total) by mouth 2 (two) times daily.   rosuvastatin 20 MG tablet Commonly known as: CRESTOR TAKE 1 TABLET BY MOUTH EVERY DAY   Synthroid 100 MCG tablet Generic drug: levothyroxine Take 100 mcg by mouth daily.   Vascepa 1 g capsule Generic drug: icosapent Ethyl Take 2 capsules (2 g total) by mouth 2 (two) times daily.            Discharge Care Instructions  (From admission,  onward)         Start     Ordered   08/14/20 0000  Discharge wound care:       Comments: Gently wash RLE with soap and water, pat dry. Apply Sween moisturizing ointment (pink and white tube in clean utility). Beginning behind the toes, spiral wrap kerlex to just below the knee then 4" ace bandage in the same way.   08/14/20 1559          Disposition and follow-up:   Joseph.[name] was discharged from [facility] in Stable condition.  At the hospital follow up visit please address:  1.  A. CHF: Patient with second admission of CHF in the past 2 months. Discharged on increased O2 requirement of 4L. Discharged on home lasix 80 mg BID. May need adjustment in diuretics and nutritional counseling. Switched home Nadolol to Carvedilol 6.25 mg BID due to PVC burden which he is tolerating without hypotension with stable bradycardia in the 50s.  B. HTN: Restarted on home ACE will need follow up BMP  C. Venous stasis dermatitis with ulceration: continued with wound care for venous statis dermatitis of RLE in hospital. Patient to continue wound care. No signs of soft tissue infection. Monitoring for continued improvement,  2.  Labs / imaging needed at time of follow-up: BMP  3.  Pending labs/ test needing follow-up: None  Follow-up  Appointments:  Follow-up Information    [name], [facility].   Specialty: Home Health Services Why: [facility], HHPT Contact information: [place] [address] [phone]               Hospital Course by problem list: 1.  [name] is a 86 year old make who has a PMHx of CHF, CKD, T2DM, CAD with MI, and HTN who presenting with worsening dyspnea and lower extremity swelling despite increase in home diuretics since discharge 07/24/2020 for acute exacerbation of CHF. Patient admitted 9/2/20201 for fluid overload and acute kidney injury in the setting of CKD.   #Acute Exacerbation of Chronic Diastolic  Heart Failure History of diastolic HF with EF of 56-21% on last echo 07/20/20. Presents with worsening lower extremity and orthopnea for 1 week. He was recently discharge for CHF exacerbation on 07/24/2020 with lasix increased to 80 mg BID. Suspected etiology of acute exacerbation due to diet noncompliance notably eating more canned foods. Diureses on 120 IV lasix BID. Diuresed from 92.3 kg down to 85.6 kg with improved SOB, lower extremity edema, and improvement JVD with remaining external JVD and minimal internal JVD. Patient with increased O2 requirement of 6L [place] on admission weaned to 4L with good O2 saturation with activity. We continued home lasix of 80 mg BID on discharge with plan for follow up with PCP for adjustments to his diuretics based on his symptoms.  #Acute Kidney Injury with CKD - Patient with PMHx of CKD stage III. Presenting with acute increase in Cr to 2.16 from 1.62 during last admission 07/24/20, elevated urine sodium and protein/cr ratio.Suspected ischemic etiology from CHF exacerbation. Holding benazepril given elevated creatinine. Tolerating diuretic therapy.   #History of HTN Continued on home amlodipine 10 mg daily. Held benazepril in setting of AKI. BP remained under goal of 180/110 during admission. Restarted on home medications at discharge.   #History of CAD History of CABG in 1996. No EKG changes, Troponin I of 40 and 44. Prior admission patient had elevated troponin as well. Likely demand ischemia Nadolol initially held for bradycardia. Started on carvedilol 3.125 mg BID tolerating with HR in upper 50s. Continued aspirin 81 mg daily.  #History of Type 2 DM A1C of 6.4, 2 weeks ago. Initially placed on 18 units of Lantus, however he had 1 episode of hypoglycemia CBG 54 after missing dinner on day 2 of admission. Lantus adjusted to 10 units daily.    #Venous Stasis Dermatitis with Ulceration Patient followed by wound care since prior admission for venous statis  dermatitis of RLE and continued with wound care during admission. Improvement in ulceration and drainage from wound during admission. Will continue to need wound care at home.  Discharge Vitals:   BP (!) 153/74 (BP Location: Right Arm)   Pulse 61   Temp 98.4 F (36.9 C) (Oral)   Resp 20   Ht 5\' 9"  (1.753 m)   Wt 85.6 kg   SpO2 94%   BMI 27.87 kg/m   Pertinent Labs, Studies, and Procedures:   CLINICAL DATA:  Shortness of breath  EXAM: PORTABLE CHEST 1 VIEW  COMPARISON:  07/19/2020 04/21/2020  FINDINGS: Post sternotomy changes. Underlying emphysematous disease. Cardiomegaly with vascular congestion, interstitial pulmonary edema and moderate left and small right pleural effusions. Left basilar consolidation. No pneumothorax.  IMPRESSION: Cardiomegaly with vascular congestion, interstitial pulmonary edema and moderate left and small right pleural effusions. Not much significant change since 07/19/2020 aside from slight progression of  consolidation in the left lower lung.   Electronically Signed   By: Donavan Foil M.D.   On: 08/08/2020 19:28  Discharge Instructions: Discharge Instructions    Diet - low sodium heart healthy   Complete by: As directed    Discharge instructions   Complete by: As directed    You were hospitalized for worsening of your heart failure. Thank you for allowing [place] to be part of your care.    Please follow up with the following providers: Garnette Czech. Delilah Shan, NP for follow up on 08/21/2020 at 2:45.  Please note these changes made to your medications:   - Medications to continue: Amlodipine, aspirin, benazepril, clonidine,  ezetimibe, furosemide, vascepa, rosuvastatin, humalog, lantus, synthroid, and potassium chloride.    - Medications to start: Please start taking carvedilol 6.25 mg 2 times a day with meals  - Medications to discontinue:  Please stop taking nadolol   Please make sure to use your walker consistently after  discharge, stand up slowly and take frequent breaks when moving around to avoid shortness of breath.   Please call our clinic if you have any questions or concerns, we may be able to help and keep you from a long and expensive emergency room wait. Our clinic and after hours phone number is [phone], the best time to call is Monday through Friday 9 am to 4 pm but there is always someone available 24/7 if you have an emergency. If you need medication refills please notify your pharmacy one week in advance and they will send [place] a request.   Discharge wound care:   Complete by: As directed    Gently wash RLE with soap and water, pat dry. Apply Sween moisturizing ointment (pink and white tube in clean utility). Beginning behind the toes, spiral wrap kerlex to just below the knee then 4" ace bandage in the same way.      Signed: Iona Beard, MD 08/16/2020, 5:56 AM   Pager: [phone]

## 2020-08-16 ENCOUNTER — Encounter (HOSPITAL_COMMUNITY): Payer: Self-pay | Admitting: Emergency Medicine

## 2020-08-16 ENCOUNTER — Emergency Department (HOSPITAL_COMMUNITY)
Admission: EM | Admit: 2020-08-16 | Discharge: 2020-08-16 | Disposition: A | Discharge status: Home / Self Care | Payer: Medicare Other | Attending: Emergency Medicine | Admitting: Emergency Medicine

## 2020-08-16 DIAGNOSIS — N183 Chronic kidney disease, stage 3 unspecified: Secondary | ICD-10-CM | POA: Diagnosis not present

## 2020-08-16 DIAGNOSIS — R1030 Lower abdominal pain, unspecified: Secondary | ICD-10-CM | POA: Diagnosis not present

## 2020-08-16 DIAGNOSIS — Z7901 Long term (current) use of anticoagulants: Secondary | ICD-10-CM | POA: Diagnosis not present

## 2020-08-16 DIAGNOSIS — I2510 Atherosclerotic heart disease of native coronary artery without angina pectoris: Secondary | ICD-10-CM | POA: Insufficient documentation

## 2020-08-16 DIAGNOSIS — J449 Chronic obstructive pulmonary disease, unspecified: Secondary | ICD-10-CM | POA: Insufficient documentation

## 2020-08-16 DIAGNOSIS — R39198 Other difficulties with micturition: Secondary | ICD-10-CM | POA: Insufficient documentation

## 2020-08-16 DIAGNOSIS — Z87891 Personal history of nicotine dependence: Secondary | ICD-10-CM | POA: Diagnosis not present

## 2020-08-16 DIAGNOSIS — I130 Hypertensive heart and chronic kidney disease with heart failure and stage 1 through stage 4 chronic kidney disease, or unspecified chronic kidney disease: Secondary | ICD-10-CM | POA: Diagnosis not present

## 2020-08-16 DIAGNOSIS — R600 Localized edema: Secondary | ICD-10-CM | POA: Diagnosis not present

## 2020-08-16 DIAGNOSIS — I5032 Chronic diastolic (congestive) heart failure: Secondary | ICD-10-CM | POA: Insufficient documentation

## 2020-08-16 DIAGNOSIS — Z79899 Other long term (current) drug therapy: Secondary | ICD-10-CM | POA: Insufficient documentation

## 2020-08-16 DIAGNOSIS — E1122 Type 2 diabetes mellitus with diabetic chronic kidney disease: Secondary | ICD-10-CM | POA: Insufficient documentation

## 2020-08-16 DIAGNOSIS — Z951 Presence of aortocoronary bypass graft: Secondary | ICD-10-CM | POA: Diagnosis not present

## 2020-08-16 DIAGNOSIS — R609 Edema, unspecified: Secondary | ICD-10-CM | POA: Insufficient documentation

## 2020-08-16 DIAGNOSIS — N4889 Other specified disorders of penis: Secondary | ICD-10-CM | POA: Diagnosis present

## 2020-08-16 LAB — CBC
HCT: 41.3 % (ref 39.0–52.0)
Hemoglobin: 13.1 g/dL (ref 13.0–17.0)
MCH: 28.1 pg (ref 26.0–34.0)
MCHC: 31.7 g/dL (ref 30.0–36.0)
MCV: 88.6 fL (ref 80.0–100.0)
Platelets: 152 10*3/uL (ref 150–400)
RBC: 4.66 MIL/uL (ref 4.22–5.81)
RDW: 16.3 % — ABNORMAL HIGH (ref 11.5–15.5)
WBC: 6.1 10*3/uL (ref 4.0–10.5)
nRBC: 0 % (ref 0.0–0.2)

## 2020-08-16 LAB — BASIC METABOLIC PANEL
Anion gap: 10 (ref 5–15)
BUN: 31 mg/dL — ABNORMAL HIGH (ref 8–23)
CO2: 26 mmol/L (ref 22–32)
Calcium: 8.9 mg/dL (ref 8.9–10.3)
Chloride: 105 mmol/L (ref 98–111)
Creatinine, Ser: 2.07 mg/dL — ABNORMAL HIGH (ref 0.61–1.24)
GFR calc Af Amer: 33 mL/min — ABNORMAL LOW (ref >60)
GFR calc non Af Amer: 28 mL/min — ABNORMAL LOW (ref >60)
Glucose, Bld: 116 mg/dL — ABNORMAL HIGH (ref 70–99)
Potassium: 4 mmol/L (ref 3.5–5.1)
Sodium: 141 mmol/L (ref 135–145)

## 2020-08-16 LAB — URINALYSIS, ROUTINE W REFLEX MICROSCOPIC
Bilirubin Urine: NEGATIVE
Glucose, UA: NEGATIVE mg/dL
Ketones, ur: NEGATIVE mg/dL
Leukocytes,Ua: NEGATIVE
Nitrite: NEGATIVE
Protein, ur: 100 mg/dL — AB
Specific Gravity, Urine: 1.015 (ref 1.005–1.030)
pH: 5 (ref 5.0–8.0)

## 2020-08-16 NOTE — ED Notes (Signed)
Daughter arrived in ED to pick up pt.

## 2020-08-16 NOTE — ED Notes (Signed)
Pt able to urinate in triage, approx 100cc urinary output.

## 2020-08-16 NOTE — ED Notes (Signed)
Daughter robin would like an update [phone]

## 2020-08-16 NOTE — ED Notes (Signed)
Patient verbalizes understanding of discharge instructions. Opportunity for questioning and answers were provided. Armband removed by staff, pt discharged from ED.  

## 2020-08-16 NOTE — ED Notes (Signed)
This RN spoke to pt's wife regarding pt d/c; pt's wife stated that she and her daughter will come pick up the pt in 30 minutes.

## 2020-08-16 NOTE — Discharge Instructions (Addendum)
You will need to continue your fluid pill to help reduce the swelling to your legs and groin area.   Your kidney function was somewhat elevated today and this will need to be rechecked by your doctor at your appointment next week.   Please return to the emergency department for any new or worsening symptoms.

## 2020-08-16 NOTE — ED Triage Notes (Signed)
Pt reports he was discharged from the hospital yesterday after being here for fluid overload, states he thinks his lasix was increased but that he did not get a good explanation, states he has only urinated 2-3 times since being home, about 3 hours ago he began having difficulty urinating. He reports fluid retention in abdomen and groin area. Denies any other symptoms. Wears 4L O2 at baseline, resp e/u, nad.

## 2020-08-16 NOTE — ED Provider Notes (Signed)
[facility] EMERGENCY DEPARTMENT Provider Note   CSN: [id number] Arrival date & time: 08/16/20  0458     History Chief Complaint  Patient presents with  . Urinary Retention  . fluid retention    [name] is a 86 y.o. male.  HPI   86 year old male with a history of CKD, CAD, diabetes, hypertension, MI, right renal mass, CHF, OSA, who presents to the emergency department today for evaluation of swelling to the penis.  Patient was recently admitted for CHF exacerbation.  He was successfully diuresed and was feeling well and discharged on 08/14/2020.  States since being discharged he has experienced swelling to the penis and has been unable to retract his foreskin for the last several days.  He states he has had decreased urine output secondary to this and feels like his lower abdomen is swollen as well.  He denies any pain or redness to the penis.  There have been no fevers.  He has been compliant with his Lasix and actually followed up with cardiology yesterday.  At that time he was noted to be in new onset A. fib and was started on Eliquis.  Past Medical History:  Diagnosis Date  . Chronic kidney disease    tumor right kidney  . Coronary artery disease   . Diabetes mellitus    type 2  . Hypertension   . Myocardial infarction ([place])    1997  . Right renal mass     Patient Active Problem List   Diagnosis Date Noted  . Peripheral edema   . Acute exacerbation of CHF (congestive heart failure) ([place]) 08/08/2020  . Diabetes ([place]) 07/21/2020  . Acute heart failure with preserved ejection fraction (HFpEF) ([place]) 07/20/2020  . OSA (obstructive sleep apnea) 05/15/2019  . Acute on chronic diastolic heart failure ([place]) 05/06/2018  . Pleural effusion 05/06/2018  . COPD (chronic obstructive pulmonary disease) with emphysema ([place]) 04/29/2017  . Lung nodule 04/29/2017  . Hx of CABG 04/08/2017  . Chronic respiratory failure with hypoxia ([place]) 03/23/2017  . Pulmonary  hypertension ([place]) 03/22/2017  . Shortness of breath 03/22/2017  . Pericardial effusion 03/22/2017  . Chronic diastolic heart failure ([place]) 02/25/2017  . Acute postoperative respiratory failure ([place]) 02/19/2017  . Chronic cholecystitis with calculus s/p robotic cholecystectomy 02/19/2017 02/19/2017  . AKI (acute kidney injury) ([place]) 11/18/2016  . Nausea vomiting and diarrhea 11/18/2016  . Renal mass s/p right partial nephrectomy 02/19/2017 11/18/2016  . Gastroenteritis 11/18/2016  . Elevated transaminase level 11/18/2016  . Dyslipidemia 05/12/2013  . Syncope 01/14/2012  . Hypoglycemia 01/14/2012  . Stage 3 chronic kidney disease 01/14/2012  . Essential hypertension 01/14/2012    Past Surgical History:  Procedure Laterality Date  . CARDIAC CATHETERIZATION  1996  . CORONARY ARTERY BYPASS GRAFT    . DOPPLER ECHOCARDIOGRAPHY  2011  . HERNIA REPAIR    . IR GENERIC HISTORICAL  12/09/2016   IR RADIOLOGIST EVAL & MGMT 12/09/2016 [name], MD [facility] INTERV RAD  . ROBOTIC ASSITED PARTIAL NEPHRECTOMY Right 02/19/2017   Procedure: XI ROBOTIC ASSITED PARTIAL NEPHRECTOMY;  Surgeon: [name], MD;  Location: [facility] ORS;  Service: Urology;  Laterality: Right;       Family History  Problem Relation Age of Onset  . Diabetes Mother   . Diabetes Sister     Social History   Tobacco Use  . Smoking status: Former Smoker    Packs/day: 0.30    Years: 45.00    Pack years: 13.50  Types: Cigarettes    Quit date: 02/15/1997    Years since quitting: 23.5  . Smokeless tobacco: Never Used  Vaping Use  . Vaping Use: Never used  Substance Use Topics  . Alcohol use: No  . Drug use: No    Home Medications Prior to Admission medications   Medication Sig Start Date End Date Taking? Authorizing Provider  acetaminophen (TYLENOL) 325 MG tablet Take 650 mg by mouth every 6 (six) hours as needed (for pain.).    [provider]  apixaban (ELIQUIS) 2.5 MG TABS tablet Take 1 tablet (2.5 mg  total) by mouth 2 (two) times daily. 08/15/20   Hilty, Nadean Corwin, MD  benazepril (LOTENSIN) 40 MG tablet Take 40 mg by mouth daily. 01/14/17   [provider]  carvedilol (COREG) 6.25 MG tablet Take 1 tablet (6.25 mg total) by mouth 2 (two) times daily with a meal. 08/14/20 09/13/20  Iona Beard, MD  cloNIDine (CATAPRES) 0.1 MG tablet Take 0.1 mg by mouth 2 (two) times daily.    [provider]  ezetimibe (ZETIA) 10 MG tablet Take 10 mg by mouth daily.    [provider]  furosemide (LASIX) 20 MG tablet Take 4 tablets (80 mg total) by mouth 2 (two) times daily. 07/24/20 08/23/20  Lacinda Axon, MD  HUMALOG KWIKPEN 100 UNIT/ML KiwkPen Inject 6-8 Units into the skin 3 (three) times daily.  07/25/15   [provider]  Icosapent Ethyl (VASCEPA) 1 g CAPS Take 2 capsules (2 g total) by mouth 2 (two) times daily. 09/05/19   Hilty, Nadean Corwin, MD  LANTUS SOLOSTAR 100 UNIT/ML Solostar Pen Inject 18 Units into the skin every evening.  02/23/19   [provider]  potassium chloride (KLOR-CON) 10 MEQ tablet Take 2 tablets (20 mEq total) by mouth 2 (two) times daily. 07/24/20 08/23/20  Lacinda Axon, MD  rosuvastatin (CRESTOR) 20 MG tablet TAKE 1 TABLET BY MOUTH EVERY DAY Patient taking differently: Take 20 mg by mouth daily.  02/09/20   Hilty, Nadean Corwin, MD  SYNTHROID 100 MCG tablet Take 100 mcg by mouth daily.  10/17/15   [provider]    Allergies    Patient has no known allergies.  Review of Systems   Review of Systems  Constitutional: Negative for fever.  HENT: Negative for ear pain and sore throat.   Eyes: Negative for visual disturbance.  Respiratory: Negative for cough and shortness of breath.   Cardiovascular: Negative for chest pain.  Gastrointestinal: Negative for abdominal pain, constipation, diarrhea, nausea and vomiting.  Genitourinary: Positive for difficulty urinating and penile swelling. Negative for dysuria, penile pain, scrotal  swelling and testicular pain.  Musculoskeletal: Negative for back pain.  Skin: Negative for rash.  Neurological: Negative for headaches.  All other systems reviewed and are negative.   Physical Exam Updated Vital Signs BP (!) 151/88   Pulse 60   Temp 97.9 F (36.6 C) (Oral)   Resp (!) 23   SpO2 98%   Physical Exam Vitals and nursing note reviewed.  Constitutional:      Appearance: He is well-developed.  HENT:     Head: Normocephalic and atraumatic.  Eyes:     Conjunctiva/sclera: Conjunctivae normal.  Cardiovascular:     Rate and Rhythm: Normal rate and regular rhythm.     Heart sounds: No murmur heard.   Pulmonary:     Effort: Pulmonary effort is normal. No respiratory distress.     Breath sounds: Normal breath sounds.  Abdominal:     General: Bowel sounds are normal.     Palpations: Abdomen is soft.     Tenderness: There is no abdominal tenderness.     Comments: Fullness noted to the suprapubic area  Genitourinary:    Comments: Chaperone present.  Edema is noted to the penis.  Penis is uncircumcised and has evidence of phimosis on exam.  There is no tenderness, erythema, induration or fluctuance noted to the penile shaft.  No swelling redness or induration noted to the scrotum. Musculoskeletal:     Cervical back: Neck supple.     Right lower leg: Edema present.     Left lower leg: Edema present.     Comments: Chronic wounds noted to the right lower extremity that do not appear infected.  Skin:    General: Skin is warm and dry.  Neurological:     Mental Status: He is alert.     ED Results / Procedures / Treatments   Labs (all labs ordered are listed, but only abnormal results are displayed) Labs Reviewed  URINALYSIS, ROUTINE W REFLEX MICROSCOPIC - Abnormal; Notable for the following components:      Result Value   Color, Urine AMBER (*)    APPearance HAZY (*)    Hgb urine dipstick SMALL (*)    Protein, ur 100 (*)    Bacteria, UA RARE (*)    All other  components within normal limits  BASIC METABOLIC PANEL - Abnormal; Notable for the following components:   Glucose, Bld 116 (*)    BUN 31 (*)    Creatinine, Ser 2.07 (*)    GFR calc non Af Amer 28 (*)    GFR calc Af Amer 33 (*)    All other components within normal limits  CBC - Abnormal; Notable for the following components:   RDW 16.3 (*)    All other components within normal limits    EKG None  Radiology No results found.  Procedures Procedures (including critical care time)  Medications Ordered in ED Medications - No data to display  ED Course  I have reviewed the triage vital signs and the nursing notes.  Pertinent labs & imaging results that were available during my care of the patient were reviewed by me and considered in my medical decision making (see chart for details).    MDM Rules/Calculators/A&P                          86 year old male presenting for evaluation of penile swelling and fullness to the lower abdomen.  Was recently admitted for CHF exacerbation and is on high-dose diuretics.  He felt like he was unable to urinate earlier today so came to the ED.  Patient has edema noted to the penis.  The foreskin was able to be retracted by my attending physician in the emergency department and patient was able to urinate.  Bladder scanner showed less than 100 cc in the bladder and myself and attending physician, Dr. Vallery Ridge also ultrasound of the bladder which showed around 350 cc of fluid. Pt w/o evidence of significant retention today. Feel he will need to continue his lasix to reduce the penile swelling and improve his sxs. His kidney function is slightly elevated today but not far from his current baseline. He will need to have this rechecked by his pcp or cardiologist at his appt next week. Have advised him to rter for any new or worsening symptoms in the  meantime. He voiced understanding of the plan and reasons to return. All questions answered, pt stable for  discharge.  Final Clinical Impression(s) / ED Diagnoses Final diagnoses:  Edema, unspecified type    Rx / DC Orders ED Discharge Orders    None       Rodney Booze, PA-C 08/16/20 1429    Charlesetta Shanks, MD 08/16/20 1504

## 2020-08-19 ENCOUNTER — Other Ambulatory Visit: Payer: Self-pay | Admitting: *Deleted

## 2020-08-19 ENCOUNTER — Encounter: Payer: Self-pay | Admitting: *Deleted

## 2020-08-19 NOTE — Patient Outreach (Signed)
[facility]) Care Management  [place]  08/19/2020   [name] August 04, 1932 [id number]  Subjective: Telephone call to patient's home number, spoke with patient, and HIPAA verified.  Discussed [facility] Liaison referral follow up, patient voiced understanding, and is in agreement to follow up.   Patient states everything is a lot better and he is doing better overall. Patient gave verbal consent for RNCM to speak with patient's wife [name]) regarding his healthcare needs as needed.  RNCM spoke with patient wife for the remainder of assessment.  Discussed [facility] Liaison referral follow up, patient's wife, she voiced understanding, and is in agreement to follow up on patient's behalf.   Wife states patient lost his daughter recently and will notify this RNCM if [place] [facility] Social Worker grief counseling resources needed in the future.    Wife states patient has a hospital follow up appointment with primary MD on 08/21/2020, wife planning to drive patient to appointment, and will notify RNCM if transportation community resources needed in the future.   States patient is receiving home health services (physical therapy, nursing for left leg wound dressing changes) and services are going well.   Wife states patient is able to manage some self care and has assistance as needed.  Wife voices understanding of medical diagnosis and treatment plan.  RNCM gave wife verbal education  on signs/ symptoms to report, how to reach provider if needed after hours, when to go to ED, and / or call 911.   Discussed Advanced Directives, advised of [place] [facility]  Advanced Directives packet resource, wife  voices understanding, and in agreement to be sent packet at this time.   RNCM advised of [employer] document scanning  system for Advanced Directives documents via local providers office or hospitals, patient  voices understanding,  and she will discuss accessing through patient's primary provider if needed in the future.   Wife states patient  does not have transportation, Data processing manager, or pharmacy needs at this time.  States she is very appreciative of the follow up and is in agreement to continue to receive [place] [facility] information / services on patient's behalf.       Objective:  Per KPN (Knowledge Performance Now, point of care tool) and chart review, patient hospitalized 08/08/2020 - 08/14/2020 for acute exacerbation of congestive heart failure, Venous Stasis Dermatitis with Ulceration.     Patient also has a history of diabetes, hypertension, CAD with CAGB in  1996, chronic kidney disease Stage 3, and on home oxygen.     Encounter Medications:  Outpatient Encounter Medications as of 08/19/2020  Medication Sig  . acetaminophen (TYLENOL) 325 MG tablet Take 650 mg by mouth every 6 (six) hours as needed (for pain.).  Marland Kitchen apixaban (ELIQUIS) 2.5 MG TABS tablet Take 1 tablet (2.5 mg total) by mouth 2 (two) times daily.  . benazepril (LOTENSIN) 40 MG tablet Take 40 mg by mouth daily.  . carvedilol (COREG) 6.25 MG tablet Take 1 tablet (6.25 mg total) by mouth 2 (two) times daily with a meal.  . cloNIDine (CATAPRES) 0.1 MG tablet Take 0.1 mg by mouth 2 (two) times daily.  Marland Kitchen ezetimibe (ZETIA) 10 MG tablet Take 10 mg by mouth daily.  . furosemide (LASIX) 20 MG tablet Take 4 tablets (80 mg total) by mouth 2 (two) times daily.  Marland Kitchen HUMALOG KWIKPEN 100 UNIT/ML KiwkPen Inject 6-8 Units into the skin 3 (three) times  daily.   . Icosapent Ethyl (VASCEPA) 1 g CAPS Take 2 capsules (2 g total) by mouth 2 (two) times daily.  Marland Kitchen LANTUS SOLOSTAR 100 UNIT/ML Solostar Pen Inject 18 Units into the skin every evening.   . potassium chloride (KLOR-CON) 10 MEQ tablet Take 2 tablets (20 mEq total) by mouth 2 (two) times daily.  . rosuvastatin (CRESTOR) 20 MG tablet TAKE 1 TABLET BY MOUTH EVERY DAY (Patient taking differently: Take 20 mg by  mouth daily. )  . SYNTHROID 100 MCG tablet Take 100 mcg by mouth daily.    No facility-administered encounter medications on file as of 08/19/2020.    Functional Status:  In your present state of health, do you have any difficulty performing the following activities: 08/13/2020 07/20/2020  Hearing? N N  Vision? N N  Difficulty concentrating or making decisions? N N  Walking or climbing stairs? N N  Dressing or bathing? N N  Doing errands, shopping? Y Y  Some recent data might be hidden    Fall/Depression Screening: Fall Risk  08/19/2020  Falls in the past year? 0  Number falls in past yr: 0  Injury with Fall? 0  Risk for fall due to : Impaired mobility  Follow up Falls evaluation completed;Education provided   [facility] 2/9 Scores 08/19/2020  Exception Documentation Other- indicate reason in comment box  Not completed Unable ask patient requested RNCM complete assessment with wife.    Assessment:  Received [facility] Liaison referral on 08/15/2020.   Referral source: [name].   Referral reason:  Please assign to [place] Coordinator for complex care and        disease management HF exacerbation   ,COPD, home 0xygen, follow up calls        and assess for further needs [ per MD notes patient daughter passed        away 3 days ago, assess for social worker support needs]  Screening follow up completed and will continue to follow up for Congestive heart failure education / monitoring, care coordination.    Plan: RNCM will send patient welcome outreach letter, welcome packet, consent, [facility] pamphlet, Advanced Directive packet, and magnet. RNCM will send primary MD barrier/ involvement letter and route assessment.  RNCM will call patient and / or patient's wife for  telephone outreach attempt within 30  business days, complex congestive heart failure care coordination follow up, and proceed with case closure, after 4th unsuccessful outreach  call.     Luva Metzger H. Annia Friendly, BSN, [place] [facility] Telephonic CM Phone: [phone] Fax: [phone]

## 2020-08-20 ENCOUNTER — Other Ambulatory Visit: Payer: Self-pay | Admitting: Internal Medicine

## 2020-08-21 DIAGNOSIS — R339 Retention of urine, unspecified: Secondary | ICD-10-CM | POA: Diagnosis not present

## 2020-08-21 DIAGNOSIS — R600 Localized edema: Secondary | ICD-10-CM | POA: Diagnosis not present

## 2020-08-21 DIAGNOSIS — Z79899 Other long term (current) drug therapy: Secondary | ICD-10-CM | POA: Diagnosis not present

## 2020-08-21 DIAGNOSIS — I509 Heart failure, unspecified: Secondary | ICD-10-CM | POA: Diagnosis not present

## 2020-08-21 DIAGNOSIS — Z09 Encounter for follow-up examination after completed treatment for conditions other than malignant neoplasm: Secondary | ICD-10-CM | POA: Diagnosis not present

## 2020-08-21 DIAGNOSIS — N4889 Other specified disorders of penis: Secondary | ICD-10-CM | POA: Diagnosis not present

## 2020-08-23 ENCOUNTER — Telehealth: Payer: Self-pay | Admitting: Internal Medicine

## 2020-08-23 ENCOUNTER — Emergency Department (HOSPITAL_COMMUNITY): Payer: Medicare Other

## 2020-08-23 ENCOUNTER — Inpatient Hospital Stay (HOSPITAL_COMMUNITY): Payer: Medicare Other

## 2020-08-23 ENCOUNTER — Other Ambulatory Visit: Payer: Self-pay

## 2020-08-23 ENCOUNTER — Inpatient Hospital Stay (HOSPITAL_COMMUNITY)
Admission: EM | Admit: 2020-08-23 | Discharge: 2020-08-30 | DRG: 291 | Disposition: A | Discharge status: Home Health | Payer: Medicare Other | Attending: Student in an Organized Health Care Education/Training Program | Admitting: Student in an Organized Health Care Education/Training Program

## 2020-08-23 DIAGNOSIS — E1122 Type 2 diabetes mellitus with diabetic chronic kidney disease: Secondary | ICD-10-CM | POA: Diagnosis not present

## 2020-08-23 DIAGNOSIS — Z87891 Personal history of nicotine dependence: Secondary | ICD-10-CM

## 2020-08-23 DIAGNOSIS — I272 Pulmonary hypertension, unspecified: Secondary | ICD-10-CM | POA: Diagnosis not present

## 2020-08-23 DIAGNOSIS — I5033 Acute on chronic diastolic (congestive) heart failure: Secondary | ICD-10-CM

## 2020-08-23 DIAGNOSIS — N189 Chronic kidney disease, unspecified: Secondary | ICD-10-CM | POA: Diagnosis not present

## 2020-08-23 DIAGNOSIS — Z794 Long term (current) use of insulin: Secondary | ICD-10-CM | POA: Diagnosis not present

## 2020-08-23 DIAGNOSIS — I252 Old myocardial infarction: Secondary | ICD-10-CM

## 2020-08-23 DIAGNOSIS — I5031 Acute diastolic (congestive) heart failure: Secondary | ICD-10-CM | POA: Diagnosis present

## 2020-08-23 DIAGNOSIS — I872 Venous insufficiency (chronic) (peripheral): Secondary | ICD-10-CM | POA: Diagnosis present

## 2020-08-23 DIAGNOSIS — I4891 Unspecified atrial fibrillation: Secondary | ICD-10-CM | POA: Diagnosis not present

## 2020-08-23 DIAGNOSIS — Z955 Presence of coronary angioplasty implant and graft: Secondary | ICD-10-CM | POA: Diagnosis not present

## 2020-08-23 DIAGNOSIS — E875 Hyperkalemia: Secondary | ICD-10-CM | POA: Diagnosis not present

## 2020-08-23 DIAGNOSIS — N179 Acute kidney failure, unspecified: Secondary | ICD-10-CM | POA: Diagnosis not present

## 2020-08-23 DIAGNOSIS — N183 Chronic kidney disease, stage 3 unspecified: Secondary | ICD-10-CM | POA: Diagnosis not present

## 2020-08-23 DIAGNOSIS — I50813 Acute on chronic right heart failure: Secondary | ICD-10-CM | POA: Diagnosis not present

## 2020-08-23 DIAGNOSIS — N281 Cyst of kidney, acquired: Secondary | ICD-10-CM | POA: Diagnosis not present

## 2020-08-23 DIAGNOSIS — I081 Rheumatic disorders of both mitral and tricuspid valves: Secondary | ICD-10-CM | POA: Diagnosis not present

## 2020-08-23 DIAGNOSIS — I5043 Acute on chronic combined systolic (congestive) and diastolic (congestive) heart failure: Secondary | ICD-10-CM | POA: Diagnosis present

## 2020-08-23 DIAGNOSIS — Z951 Presence of aortocoronary bypass graft: Secondary | ICD-10-CM

## 2020-08-23 DIAGNOSIS — I5032 Chronic diastolic (congestive) heart failure: Secondary | ICD-10-CM | POA: Diagnosis not present

## 2020-08-23 DIAGNOSIS — E785 Hyperlipidemia, unspecified: Secondary | ICD-10-CM | POA: Diagnosis present

## 2020-08-23 DIAGNOSIS — Z79890 Hormone replacement therapy (postmenopausal): Secondary | ICD-10-CM | POA: Diagnosis not present

## 2020-08-23 DIAGNOSIS — Z20822 Contact with and (suspected) exposure to covid-19: Secondary | ICD-10-CM | POA: Diagnosis present

## 2020-08-23 DIAGNOSIS — Z79899 Other long term (current) drug therapy: Secondary | ICD-10-CM

## 2020-08-23 DIAGNOSIS — J90 Pleural effusion, not elsewhere classified: Secondary | ICD-10-CM | POA: Diagnosis not present

## 2020-08-23 DIAGNOSIS — J449 Chronic obstructive pulmonary disease, unspecified: Secondary | ICD-10-CM | POA: Diagnosis not present

## 2020-08-23 DIAGNOSIS — N1832 Chronic kidney disease, stage 3b: Secondary | ICD-10-CM | POA: Diagnosis present

## 2020-08-23 DIAGNOSIS — Z4800 Encounter for change or removal of nonsurgical wound dressing: Secondary | ICD-10-CM | POA: Diagnosis not present

## 2020-08-23 DIAGNOSIS — I509 Heart failure, unspecified: Secondary | ICD-10-CM | POA: Diagnosis not present

## 2020-08-23 DIAGNOSIS — I130 Hypertensive heart and chronic kidney disease with heart failure and stage 1 through stage 4 chronic kidney disease, or unspecified chronic kidney disease: Principal | ICD-10-CM | POA: Diagnosis present

## 2020-08-23 DIAGNOSIS — L97819 Non-pressure chronic ulcer of other part of right lower leg with unspecified severity: Secondary | ICD-10-CM | POA: Diagnosis not present

## 2020-08-23 DIAGNOSIS — M109 Gout, unspecified: Secondary | ICD-10-CM | POA: Diagnosis present

## 2020-08-23 DIAGNOSIS — Z7902 Long term (current) use of antithrombotics/antiplatelets: Secondary | ICD-10-CM

## 2020-08-23 DIAGNOSIS — E876 Hypokalemia: Secondary | ICD-10-CM | POA: Diagnosis not present

## 2020-08-23 DIAGNOSIS — I2510 Atherosclerotic heart disease of native coronary artery without angina pectoris: Secondary | ICD-10-CM | POA: Diagnosis not present

## 2020-08-23 DIAGNOSIS — N1831 Chronic kidney disease, stage 3a: Secondary | ICD-10-CM | POA: Diagnosis not present

## 2020-08-23 DIAGNOSIS — I129 Hypertensive chronic kidney disease with stage 1 through stage 4 chronic kidney disease, or unspecified chronic kidney disease: Secondary | ICD-10-CM | POA: Diagnosis not present

## 2020-08-23 DIAGNOSIS — R188 Other ascites: Secondary | ICD-10-CM | POA: Diagnosis not present

## 2020-08-23 DIAGNOSIS — G4733 Obstructive sleep apnea (adult) (pediatric): Secondary | ICD-10-CM | POA: Diagnosis not present

## 2020-08-23 DIAGNOSIS — R0682 Tachypnea, not elsewhere classified: Secondary | ICD-10-CM | POA: Diagnosis not present

## 2020-08-23 DIAGNOSIS — E039 Hypothyroidism, unspecified: Secondary | ICD-10-CM | POA: Diagnosis present

## 2020-08-23 DIAGNOSIS — J439 Emphysema, unspecified: Secondary | ICD-10-CM | POA: Diagnosis not present

## 2020-08-23 DIAGNOSIS — S81802A Unspecified open wound, left lower leg, initial encounter: Secondary | ICD-10-CM | POA: Diagnosis present

## 2020-08-23 DIAGNOSIS — J811 Chronic pulmonary edema: Secondary | ICD-10-CM | POA: Diagnosis not present

## 2020-08-23 DIAGNOSIS — J9811 Atelectasis: Secondary | ICD-10-CM | POA: Diagnosis not present

## 2020-08-23 DIAGNOSIS — I4819 Other persistent atrial fibrillation: Secondary | ICD-10-CM | POA: Diagnosis not present

## 2020-08-23 DIAGNOSIS — E119 Type 2 diabetes mellitus without complications: Secondary | ICD-10-CM

## 2020-08-23 DIAGNOSIS — J9611 Chronic respiratory failure with hypoxia: Secondary | ICD-10-CM | POA: Diagnosis not present

## 2020-08-23 DIAGNOSIS — Z833 Family history of diabetes mellitus: Secondary | ICD-10-CM

## 2020-08-23 DIAGNOSIS — Z7901 Long term (current) use of anticoagulants: Secondary | ICD-10-CM

## 2020-08-23 DIAGNOSIS — N170 Acute kidney failure with tubular necrosis: Secondary | ICD-10-CM | POA: Diagnosis not present

## 2020-08-23 DIAGNOSIS — R001 Bradycardia, unspecified: Secondary | ICD-10-CM | POA: Diagnosis not present

## 2020-08-23 LAB — COMPREHENSIVE METABOLIC PANEL
ALT: 18 U/L (ref 0–44)
AST: 22 U/L (ref 15–41)
Albumin: 3.3 g/dL — ABNORMAL LOW (ref 3.5–5.0)
Alkaline Phosphatase: 109 U/L (ref 38–126)
Anion gap: 8 (ref 5–15)
BUN: 56 mg/dL — ABNORMAL HIGH (ref 8–23)
CO2: 23 mmol/L (ref 22–32)
Calcium: 8.9 mg/dL (ref 8.9–10.3)
Chloride: 109 mmol/L (ref 98–111)
Creatinine, Ser: 3.69 mg/dL — ABNORMAL HIGH (ref 0.61–1.24)
GFR calc Af Amer: 16 mL/min — ABNORMAL LOW (ref >60)
GFR calc non Af Amer: 14 mL/min — ABNORMAL LOW (ref >60)
Glucose, Bld: 85 mg/dL (ref 70–99)
Potassium: 6 mmol/L — ABNORMAL HIGH (ref 3.5–5.1)
Sodium: 140 mmol/L (ref 135–145)
Total Bilirubin: 0.8 mg/dL (ref 0.3–1.2)
Total Protein: 6.6 g/dL (ref 6.5–8.1)

## 2020-08-23 LAB — URINALYSIS, ROUTINE W REFLEX MICROSCOPIC
Bacteria, UA: NONE SEEN
Bilirubin Urine: NEGATIVE
Glucose, UA: NEGATIVE mg/dL
Hgb urine dipstick: NEGATIVE
Ketones, ur: NEGATIVE mg/dL
Leukocytes,Ua: NEGATIVE
Nitrite: NEGATIVE
Protein, ur: 30 mg/dL — AB
Specific Gravity, Urine: 1.011 (ref 1.005–1.030)
pH: 5 (ref 5.0–8.0)

## 2020-08-23 LAB — CBC WITH DIFFERENTIAL/PLATELET
Abs Immature Granulocytes: 0.03 10*3/uL (ref 0.00–0.07)
Basophils Absolute: 0 10*3/uL (ref 0.0–0.1)
Basophils Relative: 1 %
Eosinophils Absolute: 0.1 10*3/uL (ref 0.0–0.5)
Eosinophils Relative: 2 %
HCT: 41.7 % (ref 39.0–52.0)
Hemoglobin: 12.7 g/dL — ABNORMAL LOW (ref 13.0–17.0)
Immature Granulocytes: 0 %
Lymphocytes Relative: 9 %
Lymphs Abs: 0.6 10*3/uL — ABNORMAL LOW (ref 0.7–4.0)
MCH: 27.3 pg (ref 26.0–34.0)
MCHC: 30.5 g/dL (ref 30.0–36.0)
MCV: 89.5 fL (ref 80.0–100.0)
Monocytes Absolute: 0.6 10*3/uL (ref 0.1–1.0)
Monocytes Relative: 8 %
Neutro Abs: 5.9 10*3/uL (ref 1.7–7.7)
Neutrophils Relative %: 80 %
Platelets: 192 10*3/uL (ref 150–400)
RBC: 4.66 MIL/uL (ref 4.22–5.81)
RDW: 16.8 % — ABNORMAL HIGH (ref 11.5–15.5)
WBC: 7.3 10*3/uL (ref 4.0–10.5)
nRBC: 0 % (ref 0.0–0.2)

## 2020-08-23 LAB — POTASSIUM: Potassium: 6.3 mmol/L (ref 3.5–5.1)

## 2020-08-23 LAB — CBG MONITORING, ED
Glucose-Capillary: 48 mg/dL — ABNORMAL LOW (ref 70–99)
Glucose-Capillary: 147 mg/dL — ABNORMAL HIGH (ref 70–99)

## 2020-08-23 LAB — BRAIN NATRIURETIC PEPTIDE: B Natriuretic Peptide: 453.4 pg/mL — ABNORMAL HIGH (ref 0.0–100.0)

## 2020-08-23 LAB — SARS CORONAVIRUS 2 BY RT PCR (HOSPITAL ORDER, PERFORMED IN ~~LOC~~ HOSPITAL LAB): SARS Coronavirus 2: NEGATIVE

## 2020-08-23 MED ORDER — EZETIMIBE 10 MG PO TABS
10.0000 mg | ORAL_TABLET | Freq: Every day | ORAL | Status: DC
  Administered 2020-08-23 – 2020-08-30 (×8): 10 mg via ORAL
  Filled 2020-08-23 (×9): qty 1

## 2020-08-23 MED ORDER — CLONIDINE HCL 0.1 MG PO TABS
0.1000 mg | ORAL_TABLET | Freq: Two times a day (BID) | ORAL | Status: DC
  Administered 2020-08-24 – 2020-08-30 (×12): 0.1 mg via ORAL
  Filled 2020-08-23 (×12): qty 1

## 2020-08-23 MED ORDER — SODIUM CHLORIDE 0.9% FLUSH: 3.0000 mL | INTRAVENOUS | Status: DC | PRN

## 2020-08-23 MED ORDER — FUROSEMIDE 10 MG/ML IJ SOLN
120.0000 mg | Freq: Four times a day (QID) | INTRAVENOUS | Status: DC
  Administered 2020-08-23 – 2020-08-24 (×2): 120 mg via INTRAVENOUS
  Filled 2020-08-23 (×2): qty 12
  Filled 2020-08-23: qty 2
  Filled 2020-08-23 (×2): qty 12

## 2020-08-23 MED ORDER — BENAZEPRIL HCL 40 MG PO TABS: 40.0000 mg | ORAL_TABLET | Freq: Every day | ORAL | Status: DC

## 2020-08-23 MED ORDER — CALCIUM GLUCONATE-NACL 1-0.675 GM/50ML-% IV SOLN: 1.0000 g | Freq: Once | INTRAVENOUS | Status: DC

## 2020-08-23 MED ORDER — INSULIN ASPART 100 UNIT/ML ~~LOC~~ SOLN
5.0000 [IU] | Freq: Three times a day (TID) | SUBCUTANEOUS | Status: DC
  Administered 2020-08-24 – 2020-08-28 (×13): 5 [IU] via SUBCUTANEOUS

## 2020-08-23 MED ORDER — ACETAMINOPHEN 325 MG PO TABS
650.0000 mg | ORAL_TABLET | ORAL | Status: DC | PRN
  Administered 2020-08-29 – 2020-08-30 (×2): 650 mg via ORAL
  Filled 2020-08-23 (×2): qty 2

## 2020-08-23 MED ORDER — IPRATROPIUM-ALBUTEROL 0.5-2.5 (3) MG/3ML IN SOLN
3.0000 mL | RESPIRATORY_TRACT | Status: DC
  Administered 2020-08-24: 3 mL via RESPIRATORY_TRACT
  Filled 2020-08-23: qty 3

## 2020-08-23 MED ORDER — ICOSAPENT ETHYL 1 G PO CAPS
2.0000 g | ORAL_CAPSULE | Freq: Two times a day (BID) | ORAL | Status: DC
  Administered 2020-08-24 – 2020-08-30 (×13): 2 g via ORAL
  Filled 2020-08-23 (×14): qty 2

## 2020-08-23 MED ORDER — DEXTROSE 50 % IV SOLN
1.0000 | Freq: Once | INTRAVENOUS | Status: AC
  Administered 2020-08-23: 50 mL via INTRAVENOUS
  Filled 2020-08-23: qty 50

## 2020-08-23 MED ORDER — SODIUM ZIRCONIUM CYCLOSILICATE 5 G PO PACK
5.0000 g | PACK | Freq: Two times a day (BID) | ORAL | Status: DC
  Filled 2020-08-23 (×2): qty 1

## 2020-08-23 MED ORDER — CALCIUM GLUCONATE-NACL 1-0.675 GM/50ML-% IV SOLN
1.0000 g | Freq: Once | INTRAVENOUS | Status: AC
  Administered 2020-08-23: 1000 mg via INTRAVENOUS
  Filled 2020-08-23: qty 50

## 2020-08-23 MED ORDER — SODIUM CHLORIDE 0.9% FLUSH
3.0000 mL | Freq: Two times a day (BID) | INTRAVENOUS | Status: DC
  Administered 2020-08-24 – 2020-08-30 (×9): 3 mL via INTRAVENOUS

## 2020-08-23 MED ORDER — ROSUVASTATIN CALCIUM 20 MG PO TABS
20.0000 mg | ORAL_TABLET | Freq: Every day | ORAL | Status: DC
  Administered 2020-08-24 – 2020-08-30 (×7): 20 mg via ORAL
  Filled 2020-08-23 (×7): qty 1

## 2020-08-23 MED ORDER — SODIUM CHLORIDE 0.9 % IV SOLN: 250.0000 mL | INTRAVENOUS | Status: DC | PRN

## 2020-08-23 MED ORDER — IPRATROPIUM-ALBUTEROL 0.5-2.5 (3) MG/3ML IN SOLN: 3.0000 mL | Freq: Four times a day (QID) | RESPIRATORY_TRACT | Status: DC | PRN

## 2020-08-23 MED ORDER — FUROSEMIDE 10 MG/ML IJ SOLN
60.0000 mg | Freq: Once | INTRAMUSCULAR | Status: AC
  Administered 2020-08-23: 60 mg via INTRAVENOUS
  Filled 2020-08-23: qty 6

## 2020-08-23 MED ORDER — INSULIN ASPART 100 UNIT/ML IV SOLN
10.0000 [IU] | Freq: Once | INTRAVENOUS | Status: AC
  Administered 2020-08-23: 10 [IU] via INTRAVENOUS

## 2020-08-23 MED ORDER — LEVOTHYROXINE SODIUM 100 MCG PO TABS
100.0000 ug | ORAL_TABLET | ORAL | Status: DC
  Administered 2020-08-24 – 2020-08-30 (×6): 100 ug via ORAL
  Filled 2020-08-23 (×6): qty 1

## 2020-08-23 MED ORDER — HEPARIN SODIUM (PORCINE) 5000 UNIT/ML IJ SOLN: 5000.0000 [IU] | Freq: Three times a day (TID) | INTRAMUSCULAR | Status: DC

## 2020-08-23 MED ORDER — APIXABAN 2.5 MG PO TABS
2.5000 mg | ORAL_TABLET | Freq: Two times a day (BID) | ORAL | Status: DC
  Administered 2020-08-23 – 2020-08-30 (×14): 2.5 mg via ORAL
  Filled 2020-08-23 (×14): qty 1

## 2020-08-23 MED ORDER — ONDANSETRON HCL 4 MG/2ML IJ SOLN: 4.0000 mg | Freq: Four times a day (QID) | INTRAMUSCULAR | Status: DC | PRN

## 2020-08-23 NOTE — ED Notes (Signed)
Attempted to call report, was told RN was busy w another pt and would call back shortly

## 2020-08-23 NOTE — Telephone Encounter (Signed)
Patient's daughter is calling to follow up. She states the patient is confused and does not understand why the EMS was called. She would like to know if our office made the patient aware that he was being taking to the ER, prior to calling. Please return call to daughter, Kathrin Penner.

## 2020-08-23 NOTE — ED Notes (Signed)
Date and time results received: 08/23/20 2140 (use smartphrase ".now" to insert current time)  Test: Potassium Critical Value: 6.3  Name of Provider Notified: Evette Doffing, D  Orders Received? Or Actions Taken?: provider notified

## 2020-08-23 NOTE — ED Triage Notes (Signed)
Emergency Medicine Provider Triage Evaluation Note  [name] , a 86 y.o. male  was evaluated in triage.  Pt complains of shortness of breath and fluid buildup.  Patient states he was discharged in the hospital a week ago.  He reports in that time, he has had significant worsening shortness of breath and fluid buildup.  He states he is taking his fluid pills, but is not urinating a lot.  He denies fevers, chills, or chest pain.  Shortness of breath is worse when laying flat and with exertion.  He is on 4 L of oxygen at baseline, has not needed to increase this.  Per chart review, patient was diagnosed with new onset A. fib.  He has had a 17 pound weight gain in the past week.  He is on Eliquis.  Review of Systems  Positive: Shortness of breath, leg swelling Negative: Fever, chest pain  Physical Exam  BP 134/68 (BP Location: Left Arm)   Pulse (!) 57   Resp 15   SpO2 100%  Gen:   Awake, no distress   HEENT:  Atraumatic  Resp:  Normal effort, ctab Cardiac:  irreg rate Abd:   Nondistended, nontender  MSK:   Moves extremities without difficulty.  Bilateral pitting edema of lower ext, though assessment of the right leg is limited due to [employer] placement. Neuro:  Speech clear   Medical Decision Making  Medically screening exam initiated at 1:19 PM.  Appropriate orders placed.  [name] was informed that the remainder of the evaluation will be completed by another provider, this initial triage assessment does not replace that evaluation, and the importance of remaining in the ED until their evaluation is complete.  Clinical Impression   Shortness of breath and leg swelling.  Likely acute on chronic heart failure. In afib, but not rvr. No infectious sxs or cp. Will order labs, cxr, and ekg.    Franchot Heidelberg, PA-C 08/23/20 1322

## 2020-08-23 NOTE — Hospital Course (Signed)
[name] was examined and evaluated at bedside in ED. He mentions that after discharge, he was 'feeling good.' He states earlier last weekend he went to a funeral where he had some catering with baked chicken, rice and beans. After the weekend, he began to have worsening leg swelling, shortness of breath and weakness. He mentions that he also noted increased swelling in his groin and his penis which caused him to have difficulty urinating. He denies any missed doses of his diuretics and denies any increased sodium intake. He mentions usually he 'eats what his wife cooks' but she avoids salt in his food. He describes a typical meal involving mashed potatoes, roast beef and cabbages. He was able to confirm his home dose (80mg ) via teachback.  He mentions having a follow up visit with Dr.Hilty, his primary cardiologist, who stopped his aspirin and started eliquis 2.5mg  BID and stopped his nadolol. He mentions at the time he had felt well and Dr.Hilty had no further recommendation regarding his diuretic regimen.  On ROS, he denies any chest pain, dyspnea, palpitations. Denies any dysuria, urgency, frequeny.

## 2020-08-23 NOTE — ED Provider Notes (Signed)
[facility] EMERGENCY DEPARTMENT Provider Note   CSN: [id number] Arrival date & time: 08/23/20  1258     History Chief Complaint  Patient presents with  . Shortness of Breath    [name] is a 86 y.o. male.  Patient with history of HFpEF currently on Lasix 80mg  bid, CKD (baseline mid 1's), CAD, diabetes, hypertension, MI, right renal mass, OSA, recent diagnosis of afib started on Eliquis 2.5mg  -- presents to the ED for worsening heart failure symptoms. Patient was admitted to the hospital most recently 9/2-9/8 for similar symptoms. Patient's home health nurse contacted the cardiology office today due to weight gain. Patient's weight was 192 pounds on 9/9 and today it is 210 pounds. Patient has been having worsening shortness of breath, much worse with exertion. He states that he was unable to sleep last night and cannot lie flat. His legs are more swollen and he has transient scrotal swelling that is improved. Also reports decreased UOP and states that he has only been urinating once per day.  He does not feel like the lasix has been working well.  Patient denies fever or chest pain. He is on 4 L of oxygen by nasal cannula and this has changed. In addition, patient was recently diagnosed with atrial fibrillation. He was started on apixaban 2.5 mg at recent cardiology visit on 9/9.         Past Medical History:  Diagnosis Date  . Chronic kidney disease    tumor right kidney  . Coronary artery disease   . Diabetes mellitus    type 2  . Hypertension   . Myocardial infarction ([place])    1997  . Right renal mass     Patient Active Problem List   Diagnosis Date Noted  . Peripheral edema   . Acute exacerbation of CHF (congestive heart failure) ([place]) 08/08/2020  . Diabetes ([place]) 07/21/2020  . Acute heart failure with preserved ejection fraction (HFpEF) ([place]) 07/20/2020  . OSA (obstructive sleep apnea) 05/15/2019  . Acute on chronic diastolic heart failure ([place])  05/06/2018  . Pleural effusion 05/06/2018  . COPD (chronic obstructive pulmonary disease) with emphysema ([place]) 04/29/2017  . Lung nodule 04/29/2017  . Hx of CABG 04/08/2017  . Chronic respiratory failure with hypoxia ([place]) 03/23/2017  . Pulmonary hypertension ([place]) 03/22/2017  . Shortness of breath 03/22/2017  . Pericardial effusion 03/22/2017  . Chronic diastolic heart failure ([place]) 02/25/2017  . Acute postoperative respiratory failure ([place]) 02/19/2017  . Chronic cholecystitis with calculus s/p robotic cholecystectomy 02/19/2017 02/19/2017  . AKI (acute kidney injury) ([place]) 11/18/2016  . Nausea vomiting and diarrhea 11/18/2016  . Renal mass s/p right partial nephrectomy 02/19/2017 11/18/2016  . Gastroenteritis 11/18/2016  . Elevated transaminase level 11/18/2016  . Dyslipidemia 05/12/2013  . Syncope 01/14/2012  . Hypoglycemia 01/14/2012  . Stage 3 chronic kidney disease 01/14/2012  . Essential hypertension 01/14/2012    Past Surgical History:  Procedure Laterality Date  . CARDIAC CATHETERIZATION  1996  . CORONARY ARTERY BYPASS GRAFT    . DOPPLER ECHOCARDIOGRAPHY  2011  . HERNIA REPAIR    . IR GENERIC HISTORICAL  12/09/2016   IR RADIOLOGIST EVAL & MGMT 12/09/2016 [name], MD [facility] INTERV RAD  . ROBOTIC ASSITED PARTIAL NEPHRECTOMY Right 02/19/2017   Procedure: XI ROBOTIC ASSITED PARTIAL NEPHRECTOMY;  Surgeon: [name], MD;  Location: [facility] ORS;  Service: Urology;  Laterality: Right;       Family History  Problem Relation Age of Onset  .  Diabetes Mother   . Diabetes Sister     Social History   Tobacco Use  . Smoking status: Former Smoker    Packs/day: 0.30    Years: 45.00    Pack years: 13.50    Types: Cigarettes    Quit date: 02/15/1997    Years since quitting: 23.5  . Smokeless tobacco: Never Used  Vaping Use  . Vaping Use: Never used  Substance Use Topics  . Alcohol use: No  . Drug use: No    Home Medications Prior to Admission medications     Medication Sig Start Date End Date Taking? Authorizing Provider  acetaminophen (TYLENOL) 325 MG tablet Take 650 mg by mouth every 6 (six) hours as needed (for pain.).    [provider]  apixaban (ELIQUIS) 2.5 MG TABS tablet Take 1 tablet (2.5 mg total) by mouth 2 (two) times daily. 08/15/20   Hilty, Nadean Corwin, MD  benazepril (LOTENSIN) 40 MG tablet Take 40 mg by mouth daily. 01/14/17   [provider]  carvedilol (COREG) 6.25 MG tablet Take 1 tablet (6.25 mg total) by mouth 2 (two) times daily with a meal. 08/14/20 09/13/20  Iona Beard, MD  cloNIDine (CATAPRES) 0.1 MG tablet Take 0.1 mg by mouth 2 (two) times daily.    [provider]  ezetimibe (ZETIA) 10 MG tablet Take 10 mg by mouth daily.    [provider]  furosemide (LASIX) 20 MG tablet Take 4 tablets (80 mg total) by mouth 2 (two) times daily. 07/24/20 08/23/20  Lacinda Axon, MD  HUMALOG KWIKPEN 100 UNIT/ML KiwkPen Inject 6-8 Units into the skin 3 (three) times daily.  07/25/15   [provider]  Icosapent Ethyl (VASCEPA) 1 g CAPS Take 2 capsules (2 g total) by mouth 2 (two) times daily. 09/05/19   Hilty, Nadean Corwin, MD  LANTUS SOLOSTAR 100 UNIT/ML Solostar Pen Inject 18 Units into the skin every evening.  02/23/19   [provider]  potassium chloride (KLOR-CON) 10 MEQ tablet Take 2 tablets (20 mEq total) by mouth 2 (two) times daily. 07/24/20 08/23/20  Lacinda Axon, MD  rosuvastatin (CRESTOR) 20 MG tablet TAKE 1 TABLET BY MOUTH EVERY DAY 08/20/20   Hilty, Nadean Corwin, MD  SYNTHROID 100 MCG tablet Take 100 mcg by mouth daily.  10/17/15   [provider]    Allergies    Patient has no known allergies.  Review of Systems   Review of Systems  Constitutional: Negative for fever.  HENT: Negative for rhinorrhea and sore throat.   Eyes: Negative for redness.  Respiratory: Positive for shortness of breath. Negative for cough.   Cardiovascular: Positive for leg swelling.  Negative for chest pain.  Gastrointestinal: Negative for abdominal pain, diarrhea, nausea and vomiting.  Genitourinary: Positive for scrotal swelling (transient, improved). Negative for dysuria and hematuria.  Musculoskeletal: Negative for myalgias.  Skin: Negative for rash.  Neurological: Negative for headaches.    Physical Exam Updated Vital Signs BP (!) 123/51 (BP Location: Right Arm)   Pulse (!) 41   Temp 98.6 F (37 C) (Oral)   Resp 15   SpO2 94%   Physical Exam Vitals and nursing note reviewed.  Constitutional:      Appearance: He is well-developed.  HENT:     Head: Normocephalic and atraumatic.  Eyes:     General:        Right eye: No discharge.        Left eye: No discharge.  Conjunctiva/sclera: Conjunctivae normal.  Neck:     Vascular: JVD present.  Cardiovascular:     Rate and Rhythm: Normal rate. Rhythm irregular.     Heart sounds: Normal heart sounds.     Comments: HR in 70's after pivoting from chair to bed.  Pulmonary:     Effort: Pulmonary effort is normal.     Breath sounds: Decreased breath sounds present. No wheezing, rhonchi or rales.  Abdominal:     Palpations: Abdomen is soft.     Tenderness: There is no abdominal tenderness.  Musculoskeletal:     Cervical back: Normal range of motion and neck supple.     Right lower leg: Tenderness present. Edema present.     Left lower leg: Tenderness present. Edema present.     Comments: R LE chronic wounds wrapped.  Skin:    General: Skin is warm and dry.  Neurological:     Mental Status: He is alert.     ED Results / Procedures / Treatments   Labs (all labs ordered are listed, but only abnormal results are displayed) Labs Reviewed  CBC WITH DIFFERENTIAL/PLATELET - Abnormal; Notable for the following components:      Result Value   Hemoglobin 12.7 (*)    RDW 16.8 (*)    Lymphs Abs 0.6 (*)    All other components within normal limits  COMPREHENSIVE METABOLIC PANEL - Abnormal; Notable for the  following components:   Potassium 6.0 (*)    BUN 56 (*)    Creatinine, Ser 3.69 (*)    Albumin 3.3 (*)    GFR calc non Af Amer 14 (*)    GFR calc Af Amer 16 (*)    All other components within normal limits  BRAIN NATRIURETIC PEPTIDE - Abnormal; Notable for the following components:   B Natriuretic Peptide 453.4 (*)    All other components within normal limits  SARS CORONAVIRUS 2 BY RT PCR (HOSPITAL ORDER, [facility])  URINALYSIS, ROUTINE W REFLEX MICROSCOPIC    EKG EKG Interpretation  Date/Time:  Friday August 23 2020 16:10:55 EDT Ventricular Rate:  42 PR Interval:    QRS Duration: 80 QT Interval:  434 QTC Calculation: 362 R Axis:   80 Text Interpretation: Undetermined rhythm ST & T wave abnormality, consider inferior ischemia Abnormal ECG Confirmed by Dene Gentry [phone]) on 08/23/2020 5:08:23 PM   Radiology DG Chest 2 View  Result Date: 08/23/2020 CLINICAL DATA:  Short of breath.  History of CHF EXAM: CHEST - 2 VIEW COMPARISON:  08/08/2020 FINDINGS: Cardiac enlargement with prior CABG. Pulmonary vascular congestion with mild interval improvement. Mild edema with improvement. Bilateral pleural effusions left greater than right unchanged. Bibasilar atelectasis left greater than right unchanged. IMPRESSION: Mild improvement in vascular congestion and edema. Findings compatible with heart failure with bilateral effusions left greater than right. Electronically Signed   By: Franchot Gallo M.D.   On: 08/23/2020 13:57    Procedures Procedures (including critical care time)  Medications Ordered in ED Medications  furosemide (LASIX) injection 60 mg (has no administration in time range)    ED Course  I have reviewed the triage vital signs and the nursing notes.  Pertinent labs & imaging results that were available during my care of the patient were reviewed by me and considered in my medical decision making (see chart for details).  Patient seen  and examined. Work-up initiated. Medications ordered.  K=6.0 seen, lasix ordered. EKG reviewed with Dr. Francia Greaves -- shows afib  with slow ventricular response.   Vital signs reviewed and are as follows: BP (!) 123/51 (BP Location: Right Arm)   Pulse (!) 41   Temp 98.6 F (37 C) (Oral)   Resp 15   SpO2 94%   5:29 PM Spoke with IMTS who will evaluate patient.     MDM Rules/Calculators/A&P                          Pt with decompenstated heart failure and worsening kidney function. Will need admission for management.    Final Clinical Impression(s) / ED Diagnoses Final diagnoses:  Acute on chronic diastolic congestive heart failure (HCC)  Hyperkalemia  Acute renal failure superimposed on chronic kidney disease, unspecified CKD stage, unspecified acute renal failure type [facility])  Atrial fibrillation, unspecified type [facility])    Rx / DC Orders ED Discharge Orders    None       Carlisle Cater, PA-C 08/23/20 1733    Valarie Merino, MD 08/26/20 [phone]

## 2020-08-23 NOTE — ED Triage Notes (Signed)
C/o fluid retention; stated he was here recently for shortness of breathe and is now again retaining fluids.

## 2020-08-23 NOTE — H&P (Addendum)
Date: 08/23/2020               Patient Name:  [name] MRN: [id number]  DOB: [birth year] Age / Sex: 86 y.o., male   PCP: [name], MD         Medical Service: Internal Medicine Teaching Service         Attending Physician: Dr. Evette Doffing, Mallie Mussel, *    First Contact: Dr. Iona Beard, MD Pager: [phone]  Second Contact: Dr. Marianna Payment, DO Pager: [phone]       After Hours (After 5p/  First Contact Pager: [phone]  weekends / holidays): Second Contact Pager: [phone]   Chief Complaint: Shortness of breath/leg swelling  History of Present Illness: [name] is a 86 year old male with a PMHx significant for HTN, HLD, COPD, OSA, CKD 3b, recent diagnosis of afib (on Eliquis 2.5 mg), CAD s/p CABG, and HFpEF who presents for worsening shortness of breath and leg swelling. Patient states that after discharge, he was 'feeling good.' He states earlier last weekend he went to a funeral where he had some catering with baked chicken, rice and beans. After the weekend, he began to have worsening leg swelling, shortness of breath and weakness. He mentions that he also noted increased swelling in his groin and his penis which caused him to have difficulty urinating. He denies any missed doses of his diuretics and denies any increased sodium intake. He mentions usually he 'eats what his wife cooks' but she avoids salt in his food. He describes a typical meal involving mashed potatoes, baked roast beef and cabbages. He was able to confirm his home dose (80mg ) via teachback.  Patient also reports having a follow up visit with [name], his primary cardiologist, who stopped his aspirin, started eliquis 2.5mg  BID and stopped his nadolol. He mentions at the time he had felt well and [name] had no further recommendation regarding his diuretic regimen.  On ROS, he endorse orthopnea but denies any chest pain, palpitations, dysuria, urgency, frequeny.  Meds:  Eliquis 2.5 mg twice daily Benazepril 40  mg daily Carvedilol 6.25 mg twice daily Clonidine 0.1 mg twice daily Ezetimibe 10 mg daily Furosemide 80 mg twice daily Humalog inject 6 to 8 units 3 times daily Lantus inject 18 units into the skin every evening Rosuvastatin 20 mg daily Synthroid 100 MCG daily Potassium chloride 20 mEq twice daily  Allergies: Allergies as of 08/23/2020   (No Known Allergies)   Past Medical History:  Diagnosis Date   Chronic kidney disease    tumor right kidney   Coronary artery disease    Diabetes mellitus    type 2   Hypertension    Myocardial infarction [facility])    1997   Right renal mass     Family History: Family history of diabetes in mother and sister.  Social History: Married and lives with his wife. Former smoker. Quit smoking in 1998 and has a 13.5 pack year history. No alcohol or illicit drug use. His daughter recently passed away.  Review of Systems: A complete ROS was negative except as per HPI.  Physical Exam: Blood pressure (!) 123/51, pulse (!) 41, temperature 98.6 F (37 C), temperature source Oral, resp. rate 15, SpO2 94 %.  General: Pleasant and uncomfortable elderly male laying in bed. No acute distress. Head: Normocephalic. Atraumatic Neck: +JVD to jaw line at 30 degrees CV: Bradycardic. Regular rhythm. No murmurs, rubs or gallops. 2+ bilateral lower extremity pitting edema to  upper thighs. Pulmonary:  On 4 L [place] with sats 90-92%. Mildly tachypneic. Decreased breath sounds bilaterally.  Mild inspiratory wheezes upper lung fields bilaterally. No crackles. No increased work of breathing. Abdomen: Soft, nontender. Moderately distention. Normal bowel sounds. GU: Moderate scrotal and penile edema. No tenderness.  Extremities: Venous stasis of the left lower extremity covered with bandage. Faint dorsalis pedis pulses bilaterally. Skin: Warm and dry.  No obvious lesions. Neuro: Alert and oriented x3.  Moves all extremities.  Normal sensation. No focal deficit Psych: Normal  mood and affect  EKG: personally reviewed my interpretation is sinus bradycardia.   CXR: personally reviewed my interpretation is mild bilateral pleural effusion left greater than right.  Mild improvement in pulmonary vascular congestion.  Ultrasound renal: No acute changes. Cortical scarring in the upper pole of the right kidney unchanged from previous ultrasound.  Assessment & Plan by Problem:  Principal Problem:   Acute on chronic right heart failure (HCC) Active Problems:   Acute on chronic renal failure (HCC)   OSA (obstructive sleep apnea)   Diabetes [name])   [name] is a 86 year old male with a PMHx significant for HTN, HLD, COPD, OSA, CKD 3b, recent diagnosis of afib (on Eliquis 2.5 mg), CAD s/p CABG, and HFpEF who was found to have acute heart failure exacerbation and currently undergoing diuresis.  Hospital course complicated by hyperkalemia.  Acute HFpEF EF 60-65%. BNP today was elevate to the 400s, but significantly less than previous admissions. This is patient's 3rd admission for heart failure exacerbation in the last 5 weeks. Exam significant for JVD today jaw, 2+ bilateral pitting edema to the thighs, warm extremities and decreased lung sounds but no crackles. Presentation consistent with right heart failure in the setting of recent increased salt intake. We'll continue IV diuresis monitor respiratory status. --S/p IV Lasix 60 mg --IV lasix 120 mg every 6 hours --Strict I&O --F/u  AM BMP, Mg --Daily weight  AKI Patient was found to have creatinine of 3.69 BUN of 56 on admission up from creatinine of 2.07 and BUN of 31 at discharge 1 week ago.  Patient is on Lasix 80 mg twice daily at home.  Acute kidney injuries secondary to acute heart failure exacerbation. --Continue diuresis with IV Lasix --Follow-up morning BMP --Avoid nephrotoxic agent --Replete electrolytes as needed  Hyperkalemia Patient was found to have potassium of 6.0 on admission. Repeat potassium  increased to 6.3. EKG showed sinus bradycardia but no peaked T waves, PR prolongation or ST depression. Patient is on potassium chloride 20 mEq twice daily at home. --F/u repeat potassium --Calcium gluconate 1000 mg IV x1 dose --Insulin with D5 --Albuterol nebulizer every 4 hours for 3 doses --Lokelma 5 g twice daily --Repeat EKG  Type 2 DM A1c 6.4, insulin dependent. Blood sugar was found to be normal at 85 on CMP on admission. --Daily CBGs  Hypothyroidism Stable. TSH normal at 3.57 one month ago.  --Continue synthroid 100 mcg   HTN Patient presented with BP of 134/68. Holding his ACE/ARB in the setting acute kidney injury. Home nadolol was recently discontinued by primary cardiology. --Cont home clonidine 0.1 mg BID --Hold ACE/ARB --Daily vitals   HLD Stable. Recent lipid panel shows isolated decrease in HDL to 28, otherwise, normal. --Continue rosuvastatin 20 mg QD --Continue ezetimibe 10 mg QD   Venous Insufficiency Chronic wound on left lower extremity that started after left lower leg vein was harvested  for CABG procedure. Wound is wrapped in a bandage. --Consider wound  care referral  Code Status: Full Code FEN: HH DVT PPx: Eliquis  Dispo: Admit patient to Inpatient with expected length of stay greater than 2 midnights.  Signed: Lacinda Axon, MD 08/23/2020, 9:14 PM  Pager: [phone] Internal Medicine Teaching Service After 5pm on weekdays and 1pm on weekends: On Call pager: [phone]

## 2020-08-23 NOTE — Telephone Encounter (Signed)
Returned call to patients daughter. Patients daughter states that EMS was not called, and she took patient to the ER herself in her car. Patients daughter just wanted to see if the ER would know why the patient was needing to be evaluated since no family could go in with patient. Advised patients daughter that our notes from the office could be seen through the shared computer system.

## 2020-08-23 NOTE — Telephone Encounter (Signed)
Spoke with Patient Daughter- advised her of Dr. Blenda Mounts recommendation of patient being evaluated in the ER. Daughter verbalized understanding. See other Telephone note

## 2020-08-23 NOTE — ED Notes (Signed)
Patient transported to MRI 

## 2020-08-23 NOTE — ED Notes (Signed)
MD Truman Hayward called in regard to pt hyperkalemia, asked for EKG- NT doing that now

## 2020-08-23 NOTE — Telephone Encounter (Signed)
Pt c/o swelling: STAT is pt has developed SOB within 24 hours  1) How much weight have you gained and in what time span? 17 pounds in 1 week   2) If swelling, where is the swelling located? Lower extremities   3) Are you currently taking a fluid pill? Yes furosemide (LASIX) 20 MG tablet 4 tablets 2x daily  4) Are you currently SOB? With walking   5) Do you have a log of your daily weights (if so, list)?   08/15/20 192  08/23/20 209.8  6) Have you gained 3 pounds in a day or 5 pounds in a week? 17 pounds in 1 week   7) Have you traveled recently? No  Judeen Hammans, Home Health Nurse with [facility] called. The patient does have some new onset Afib. Dr. Debara Pickett stated at the visit the new onset of afib could have caused the weight gain. The patient was also started on apixaban (ELIQUIS) 2.5 MG TABS tablet and carvedilol (COREG) 6.25 MG tablet at his recent office visit   The Home Health Nurse is thinking the patient could start Torsemide to draw fluid off and reduce damage to the patient's kidneys. Please advise     Patient has given verbal permission for [place] to contact Uri Turnbough, the patient's daughter. Shirlean Mylar was not listed on the patient's DPR. Judeen Hammans can be reached at [phone] for further clarification as well.

## 2020-08-23 NOTE — ED Notes (Signed)
Pt bgl is 80- MD made aware, giving pt juice

## 2020-08-23 NOTE — Telephone Encounter (Signed)
Follow up:     Patient daughter returning a call back.

## 2020-08-23 NOTE — Telephone Encounter (Signed)
Patients home health nurse called in to office in regards to patient gaining 17 pounds in 1 week. Patient was seen last week 9/9 by Dr. Debara Pickett for new onset of Afib. Patient weighed 192 lb and patients home health nurse stated he weighed 209.8 lbs today. Per home health nurse patient does have extensive swelling in the lower extremities including a wound on his right leg, as well as abdominal distention. Patient currently taking 80mg  of lasix bid. Patient last BM 2 days ago and states he does not have any issues with that. Per home health nurse patient denies chest pain, but does have shortness of breath with lying (he has to remain upright), and shortness of breath with activity. Per home health nurse- patient's most recent BP= 140/62 and HR= 25. Spoke with DOD (Dr. Oval Linsey), she advises patient go to the ER to be evaluated. Advised patients home health nurse Judeen Hammans). Also called patient and patients wife and advised both of them of Dr. Quintella Reichert recommendation of being evaluated in the ER. Patient's wife verbalized understanding.

## 2020-08-24 DIAGNOSIS — I130 Hypertensive heart and chronic kidney disease with heart failure and stage 1 through stage 4 chronic kidney disease, or unspecified chronic kidney disease: Principal | ICD-10-CM

## 2020-08-24 DIAGNOSIS — I50813 Acute on chronic right heart failure: Secondary | ICD-10-CM | POA: Diagnosis present

## 2020-08-24 DIAGNOSIS — J449 Chronic obstructive pulmonary disease, unspecified: Secondary | ICD-10-CM

## 2020-08-24 DIAGNOSIS — I5033 Acute on chronic diastolic (congestive) heart failure: Secondary | ICD-10-CM

## 2020-08-24 DIAGNOSIS — Z955 Presence of coronary angioplasty implant and graft: Secondary | ICD-10-CM

## 2020-08-24 DIAGNOSIS — E875 Hyperkalemia: Secondary | ICD-10-CM

## 2020-08-24 DIAGNOSIS — I2510 Atherosclerotic heart disease of native coronary artery without angina pectoris: Secondary | ICD-10-CM

## 2020-08-24 DIAGNOSIS — E785 Hyperlipidemia, unspecified: Secondary | ICD-10-CM

## 2020-08-24 DIAGNOSIS — E1122 Type 2 diabetes mellitus with diabetic chronic kidney disease: Secondary | ICD-10-CM

## 2020-08-24 DIAGNOSIS — R600 Localized edema: Secondary | ICD-10-CM

## 2020-08-24 DIAGNOSIS — R001 Bradycardia, unspecified: Secondary | ICD-10-CM

## 2020-08-24 DIAGNOSIS — N179 Acute kidney failure, unspecified: Secondary | ICD-10-CM

## 2020-08-24 DIAGNOSIS — R0682 Tachypnea, not elsewhere classified: Secondary | ICD-10-CM

## 2020-08-24 LAB — GLUCOSE, CAPILLARY
Glucose-Capillary: 146 mg/dL — ABNORMAL HIGH (ref 70–99)
Glucose-Capillary: 128 mg/dL — ABNORMAL HIGH (ref 70–99)
Glucose-Capillary: 98 mg/dL (ref 70–99)
Glucose-Capillary: 108 mg/dL — ABNORMAL HIGH (ref 70–99)

## 2020-08-24 LAB — BASIC METABOLIC PANEL
Anion gap: 11 (ref 5–15)
Anion gap: 12 (ref 5–15)
Anion gap: 10 (ref 5–15)
Anion gap: 10 (ref 5–15)
BUN: 61 mg/dL — ABNORMAL HIGH (ref 8–23)
BUN: 59 mg/dL — ABNORMAL HIGH (ref 8–23)
BUN: 61 mg/dL — ABNORMAL HIGH (ref 8–23)
BUN: 60 mg/dL — ABNORMAL HIGH (ref 8–23)
CO2: 22 mmol/L (ref 22–32)
CO2: 20 mmol/L — ABNORMAL LOW (ref 22–32)
CO2: 22 mmol/L (ref 22–32)
CO2: 24 mmol/L (ref 22–32)
Calcium: 9 mg/dL (ref 8.9–10.3)
Calcium: 8.9 mg/dL (ref 8.9–10.3)
Calcium: 9.2 mg/dL (ref 8.9–10.3)
Calcium: 9 mg/dL (ref 8.9–10.3)
Chloride: 107 mmol/L (ref 98–111)
Chloride: 109 mmol/L (ref 98–111)
Chloride: 109 mmol/L (ref 98–111)
Chloride: 107 mmol/L (ref 98–111)
Creatinine, Ser: 3.68 mg/dL — ABNORMAL HIGH (ref 0.61–1.24)
Creatinine, Ser: 3.71 mg/dL — ABNORMAL HIGH (ref 0.61–1.24)
Creatinine, Ser: 3.43 mg/dL — ABNORMAL HIGH (ref 0.61–1.24)
Creatinine, Ser: 3.51 mg/dL — ABNORMAL HIGH (ref 0.61–1.24)
GFR calc Af Amer: 16 mL/min — ABNORMAL LOW (ref >60)
GFR calc Af Amer: 16 mL/min — ABNORMAL LOW (ref >60)
GFR calc Af Amer: 18 mL/min — ABNORMAL LOW (ref >60)
GFR calc Af Amer: 17 mL/min — ABNORMAL LOW (ref >60)
GFR calc non Af Amer: 14 mL/min — ABNORMAL LOW (ref >60)
GFR calc non Af Amer: 14 mL/min — ABNORMAL LOW (ref >60)
GFR calc non Af Amer: 15 mL/min — ABNORMAL LOW (ref >60)
GFR calc non Af Amer: 15 mL/min — ABNORMAL LOW (ref >60)
Glucose, Bld: 138 mg/dL — ABNORMAL HIGH (ref 70–99)
Glucose, Bld: 86 mg/dL (ref 70–99)
Glucose, Bld: 96 mg/dL (ref 70–99)
Glucose, Bld: 118 mg/dL — ABNORMAL HIGH (ref 70–99)
Potassium: 6.4 mmol/L (ref 3.5–5.1)
Potassium: 5.5 mmol/L — ABNORMAL HIGH (ref 3.5–5.1)
Potassium: 5.8 mmol/L — ABNORMAL HIGH (ref 3.5–5.1)
Potassium: 5.3 mmol/L — ABNORMAL HIGH (ref 3.5–5.1)
Sodium: 140 mmol/L (ref 135–145)
Sodium: 141 mmol/L (ref 135–145)
Sodium: 141 mmol/L (ref 135–145)
Sodium: 141 mmol/L (ref 135–145)

## 2020-08-24 LAB — MAGNESIUM: Magnesium: 2.5 mg/dL — ABNORMAL HIGH (ref 1.7–2.4)

## 2020-08-24 MED ORDER — DEXTROSE 50 % IV SOLN
INTRAVENOUS | Status: AC
  Filled 2020-08-24: qty 50

## 2020-08-24 MED ORDER — INSULIN ASPART 100 UNIT/ML IV SOLN
10.0000 [IU] | Freq: Once | INTRAVENOUS | Status: AC
  Administered 2020-08-24: 10 [IU] via INTRAVENOUS

## 2020-08-24 MED ORDER — SODIUM ZIRCONIUM CYCLOSILICATE 10 G PO PACK
10.0000 g | PACK | Freq: Three times a day (TID) | ORAL | Status: DC
  Administered 2020-08-24 (×3): 10 g via ORAL
  Filled 2020-08-24 (×3): qty 1

## 2020-08-24 MED ORDER — SODIUM ZIRCONIUM CYCLOSILICATE 10 G PO PACK: 10.0000 g | PACK | Freq: Three times a day (TID) | ORAL | Status: DC

## 2020-08-24 MED ORDER — DEXTROSE 50 % IV SOLN
1.0000 | Freq: Once | INTRAVENOUS | Status: AC
  Administered 2020-08-24: 50 mL via INTRAVENOUS
  Filled 2020-08-24: qty 50

## 2020-08-24 MED ORDER — FUROSEMIDE 10 MG/ML IJ SOLN
20.0000 mg/h | INTRAVENOUS | Status: AC
  Administered 2020-08-24 – 2020-08-27 (×4): 20 mg/h via INTRAVENOUS
  Filled 2020-08-24 (×6): qty 25

## 2020-08-24 MED ORDER — CALCIUM GLUCONATE-NACL 1-0.675 GM/50ML-% IV SOLN
1.0000 g | Freq: Once | INTRAVENOUS | Status: AC
  Administered 2020-08-24: 1000 mg via INTRAVENOUS
  Filled 2020-08-24: qty 50

## 2020-08-24 MED ORDER — IPRATROPIUM-ALBUTEROL 0.5-2.5 (3) MG/3ML IN SOLN
3.0000 mL | Freq: Three times a day (TID) | RESPIRATORY_TRACT | Status: DC
  Administered 2020-08-24 – 2020-08-26 (×7): 3 mL via RESPIRATORY_TRACT
  Filled 2020-08-24 (×7): qty 3

## 2020-08-24 MED ORDER — METOLAZONE 5 MG PO TABS
5.0000 mg | ORAL_TABLET | Freq: Once | ORAL | Status: AC
  Administered 2020-08-24: 5 mg via ORAL
  Filled 2020-08-24: qty 1

## 2020-08-24 NOTE — Progress Notes (Addendum)
HD#1 Subjective:  Overnight Events: None   Patient continues to have orthopnea. Continues to have swelling in legs and scrotum but feels he has some improvement. Denies chest pain, abdominal pain, nausea, and vomiting  Objective:  Vital signs in last 24 hours: Vitals:   08/24/20 0313 08/24/20 0533 08/24/20 0817 08/24/20 0952  BP:  (!) 144/80 (!) 162/75   Pulse:  72 80   Resp:  20 19   Temp:  (!) 97.5 F (36.4 C) 98 F (36.7 C)   TempSrc:  Oral Oral   SpO2: 93% 90% 94% 95%  Weight:      Height:       Supplemental O2: Nasal Cannula SpO2: 95 % O2 Flow Rate (L/min): 2 L/min FiO2 (%): 28 %   Physical Exam:  Physical Exam Cardiovascular:     Rate and Rhythm: Bradycardia present. Rhythm irregular.     Comments: Bilateral pitting edema up with sacrum with scrotal swelling Pulmonary:     Effort: Tachypnea present.     Comments: Increased work of breathing worsens as he speaks    [employer]   08/24/20 0054  Weight: 93.9 kg     Intake/Output Summary (Last 24 hours) at 08/24/2020 1121 Last data filed at 08/24/2020 0813 Gross per 24 hour  Intake 240 ml  Output 300 ml  Net -60 ml   Net IO Since Admission: -60 mL [08/24/20 1121]  Pertinent Labs: CBC Latest Ref Rng & Units 08/23/2020 08/16/2020 08/14/2020  WBC 4.0 - 10.5 K/uL 7.3 6.1 7.1  Hemoglobin 13.0 - 17.0 g/dL 12.7(L) 13.1 14.2  Hematocrit 39 - 52 % 41.7 41.3 44.8  Platelets 150 - 400 K/uL 192 152 191    CMP Latest Ref Rng & Units 08/24/2020 08/23/2020 08/23/2020  Glucose 70 - 99 mg/dL 138(H) - 85  BUN 8 - 23 mg/dL 61(H) - 56(H)  Creatinine 0.61 - 1.24 mg/dL 3.68(H) - 3.69(H)  Sodium 135 - 145 mmol/L 140 - 140  Potassium 3.5 - 5.1 mmol/L 6.4(HH) 6.3(HH) 6.0(H)  Chloride 98 - 111 mmol/L 107 - 109  CO2 22 - 32 mmol/L 22 - 23  Calcium 8.9 - 10.3 mg/dL 9.0 - 8.9  Total Protein 6.5 - 8.1 g/dL - - 6.6  Total Bilirubin 0.3 - 1.2 mg/dL - - 0.8  Alkaline Phos 38 - 126 U/L - - 109  AST 15 - 41 U/L - - 22  ALT 0 -  44 U/L - - 18    Imaging: DG Chest 2 View  Result Date: 08/23/2020 CLINICAL DATA:  Short of breath.  History of CHF EXAM: CHEST - 2 VIEW COMPARISON:  08/08/2020 FINDINGS: Cardiac enlargement with prior CABG. Pulmonary vascular congestion with mild interval improvement. Mild edema with improvement. Bilateral pleural effusions left greater than right unchanged. Bibasilar atelectasis left greater than right unchanged. IMPRESSION: Mild improvement in vascular congestion and edema. Findings compatible with heart failure with bilateral effusions left greater than right. Electronically Signed   By: Franchot Gallo M.D.   On: 08/23/2020 13:57   US RENAL  Result Date: 08/23/2020 CLINICAL DATA:  Acute kidney injury EXAM: RENAL / URINARY TRACT ULTRASOUND COMPLETE COMPARISON:  None. FINDINGS: Right Kidney: Renal measurements: 10.3 x 4.9 x 6.0 cm = volume: 157 mL. Echogenicity within normal limits. Cortical scarring seen in the upper pole which could be from prior partial nephrectomy. No mass or hydronephrosis visualized. Left Kidney: Renal measurements: 8.9 x 4.9 x 5 4 cm = volume: 121 mL. Echogenicity within  normal limits. Anechoic cysts are seen within the left kidney the largest measuring 2.6 x 1.9 x 2.2 cm. Bladder: Appears normal for degree of bladder distention. Other: A small right pleural effusion is present. There is a small amount of perihepatic ascites present. IMPRESSION: Cortical scarring in the upper pole the right kidney, likely from partial nephrectomy. Left renal cysts Small right pleural effusion and small amount of perihepatic ascites. Electronically Signed   By: Prudencio Pair M.D.   On: 08/23/2020 21:26    Assessment/Plan:   Principal Problem:   Acute on chronic right heart failure (HCC) Active Problems:   Acute on chronic renal failure (HCC)   OSA (obstructive sleep apnea)   Diabetes [facility])   Patient Summary: [name] is a 86 year old male with a PMHx significant for HTN, HLD, COPD,  OSA, CKD 3b, recent diagnosis of afib (on Eliquis 2.5 mg), CAD s/p CABG, and HFpEF who was found to have acute heart failure exacerbation and currently undergoing diuresis.  Hospital course complicated by hyperkalemia.   Acute on Chronic Right Heart Failure and HFpEF This is the patients third admission for heart failure exacerbation in the last 5 weeks. Exam significant for external JVD today jaw, 2+ bilateral pitting edema to sacrum and scrotal edema and warm extremities. Heart failure exacerbation likely worsening in setting no new onset afib. -- IV Lasix infusion 20 mg/hr  -- Metolazone later today if inadequate urine output with lasix alone -- Serum IFE, light chains now. PYP scan later in the week to rule out amyloidosis. -- Strict I&O -- Daily weight   Acute on Chronic Renal Failure Patient was found to have creatinine of 3.69 BUN of 56 on admission. Cr 3.68 today.  Baseline CKD stage IIIb.  Complicated by hyperkalemia with potassium to 6.4 but no EKG changes.  Etiology likely cardiorenal syndrome.  Hope to see renal function improve as he diuresis and decongestion.  Obstructive uropathy ruled out with renal ultrasound. --Continue diuresis with IV Lasix ggt --BMP q 8 hours --Lokelma for hyperkalemia   HTN BP today is 145/78. Holding his ACE/ARB in the setting acute kidney injury. --Cont home clonidine 0.1 mg BID --Daily vitals   Diet: Renal IVF: None,None VTE: NOAC Code: Full  Dispo: Anticipated discharge to Home pending improvement  Joseph Beard, MD 08/24/2020, 11:21 AM Pager: [phone]  Please contact the on call pager after 5 pm and on weekends at [phone].

## 2020-08-24 NOTE — Evaluation (Signed)
Physical Therapy Evaluation Patient Details Name: KOTA CIANCIO MRN: [id number] DOB: 26-Mar-1932 Today's Date: 08/24/2020   History of Present Illness  Mr. Basnett is a 86 year old male with a PMHx significant for HTN, HLD, COPD, OSA, CKD 3b, recent diagnosis of afib (on Eliquis 2.5 mg), CAD s/p CABG, and HFpEF who presents for worsening shortness of breath and leg swelling. Patient states that after discharge, he was 'feeling good.' He states earlier last weekend he went to a funeral where he had some catering with baked chicken, rice and beans. After the weekend, he began to have worsening leg swelling, shortness of breath and weakness. He mentions that he also noted increased swelling in his groin and his penis which caused him to have difficulty urinating  Clinical Impression  Patient received up in recliner. Agrees to PT assessment. Reports his breathing and LE swelling has improved. Alert, oriented. He transfers sit to stand without any physical assist, ambulated 25 feet in room on supplemental O2 with supervision to min guard. No lob. No AD. He is more sob with ambulation, but saturations remained > 90% after walking. He will continue to benefit from skilled PT while here to improve activity tolerance.      Follow Up Recommendations Home health PT;Supervision - Intermittent    Equipment Recommendations  None recommended by PT    Recommendations for Other Services       Precautions / Restrictions Precautions Precautions: Fall Precaution Comments: mod fall Restrictions Weight Bearing Restrictions: No      Mobility  Bed Mobility               General bed mobility comments: pt in chair on arrival and end of session  Transfers Overall transfer level: Independent Equipment used: None Transfers: Sit to/from Stand Sit to Stand: Independent         General transfer comment: monitored sats  Ambulation/Gait Ambulation/Gait assistance: Supervision Gait Distance (Feet): 30  Feet Assistive device: None Gait Pattern/deviations: Step-through pattern;Decreased stride length Gait velocity: decreased   General Gait Details: patient with sob walking 25 feet this date. O2 sats remained > 90% on 2 lpm O2.  Stairs            Wheelchair Mobility    Modified Rankin (Stroke Patients Only)       Balance Overall balance assessment: Mild deficits observed, not formally tested Sitting-balance support: Feet supported Sitting balance-Leahy Scale: Good     Standing balance support: During functional activity;No upper extremity supported Standing balance-Leahy Scale: Good Standing balance comment: no lob with ambulation in room without ad, min guard/supervision                             Pertinent Vitals/Pain Pain Assessment: No/denies pain    Home Living Family/patient expects to be discharged to:: Private residence Living Arrangements: Spouse/significant other Available Help at Discharge: Family;Available 24 hours/day Type of Home: House Home Access: Stairs to enter   [employer] of Steps: 2-3 Home Layout: One level Home Equipment: Walker - 2 wheels;Shower seat Additional Comments: Reports PT at home 2x per week    Prior Function Level of Independence: Independent         Comments: sponge bathes, wife does cooking     Hand Dominance   Dominant Hand: Right    Extremity/Trunk Assessment   Upper Extremity Assessment Upper Extremity Assessment: Overall WFL for tasks assessed    Lower Extremity Assessment Lower Extremity  Assessment: Overall WFL for tasks assessed    Cervical / Trunk Assessment Cervical / Trunk Assessment: Normal  Communication   Communication: HOH  Cognition Arousal/Alertness: Awake/alert Behavior During Therapy: WFL for tasks assessed/performed Overall Cognitive Status: Within Functional Limits for tasks assessed                                        General Comments       Exercises     Assessment/Plan    PT Assessment Patient needs continued PT services  PT Problem List Decreased activity tolerance;Cardiopulmonary status limiting activity       PT Treatment Interventions Therapeutic activities;Therapeutic exercise;Gait training;Functional mobility training;Patient/family education    PT Goals (Current goals can be found in the Care Plan section)  Acute Rehab PT Goals Patient Stated Goal: to return home PT Goal Formulation: With patient Time For Goal Achievement: 08/31/20 Potential to Achieve Goals: Good    Frequency Min 3X/week   Barriers to discharge   patient at home with wife, has PT 2x per week. Children nearby to assist as needed    Co-evaluation               AM-PAC PT "6 Clicks" Mobility  Outcome Measure Help needed turning from your back to your side while in a flat bed without using bedrails?: None Help needed moving from lying on your back to sitting on the side of a flat bed without using bedrails?: None Help needed moving to and from a bed to a chair (including a wheelchair)?: A Little Help needed standing up from a chair using your arms (e.g., wheelchair or bedside chair)?: None Help needed to walk in hospital room?: A Little Help needed climbing 3-5 steps with a railing? : A Little 6 Click Score: 21    End of Session Equipment Utilized During Treatment: Gait belt;Oxygen Activity Tolerance: Patient tolerated treatment well Patient left: in chair;with call bell/phone within reach Nurse Communication: Mobility status PT Visit Diagnosis: Difficulty in walking, not elsewhere classified (R26.2)    Time: 1130-1145 PT Time Calculation (min) (ACUTE ONLY): 15 min   Charges:   PT Evaluation $PT Eval Moderate Complexity: 1 Mod          Megan Hayduk, PT, GCS 08/24/20,12:16 PM

## 2020-08-24 NOTE — Plan of Care (Signed)
  Problem: Education: Goal: Ability to verbalize understanding of medication therapies will improve Outcome: Progressing   

## 2020-08-25 ENCOUNTER — Other Ambulatory Visit: Payer: Self-pay | Admitting: Student

## 2020-08-25 LAB — BASIC METABOLIC PANEL
Anion gap: 9 (ref 5–15)
Anion gap: 11 (ref 5–15)
BUN: 60 mg/dL — ABNORMAL HIGH (ref 8–23)
BUN: 61 mg/dL — ABNORMAL HIGH (ref 8–23)
CO2: 24 mmol/L (ref 22–32)
CO2: 25 mmol/L (ref 22–32)
Calcium: 8.7 mg/dL — ABNORMAL LOW (ref 8.9–10.3)
Calcium: 8.7 mg/dL — ABNORMAL LOW (ref 8.9–10.3)
Chloride: 106 mmol/L (ref 98–111)
Chloride: 104 mmol/L (ref 98–111)
Creatinine, Ser: 3.3 mg/dL — ABNORMAL HIGH (ref 0.61–1.24)
Creatinine, Ser: 3.37 mg/dL — ABNORMAL HIGH (ref 0.61–1.24)
GFR calc Af Amer: 19 mL/min — ABNORMAL LOW (ref >60)
GFR calc Af Amer: 18 mL/min — ABNORMAL LOW (ref >60)
GFR calc non Af Amer: 16 mL/min — ABNORMAL LOW (ref >60)
GFR calc non Af Amer: 16 mL/min — ABNORMAL LOW (ref >60)
Glucose, Bld: 154 mg/dL — ABNORMAL HIGH (ref 70–99)
Glucose, Bld: 146 mg/dL — ABNORMAL HIGH (ref 70–99)
Potassium: 4.8 mmol/L (ref 3.5–5.1)
Potassium: 4.5 mmol/L (ref 3.5–5.1)
Sodium: 139 mmol/L (ref 135–145)
Sodium: 140 mmol/L (ref 135–145)

## 2020-08-25 LAB — PROTEIN / CREATININE RATIO, URINE
Creatinine, Urine: 37.23 mg/dL
Total Protein, Urine: <6 mg/dL

## 2020-08-25 LAB — SODIUM, URINE, RANDOM: Sodium, Ur: 99 mmol/L

## 2020-08-25 LAB — GLUCOSE, CAPILLARY
Glucose-Capillary: 184 mg/dL — ABNORMAL HIGH (ref 70–99)
Glucose-Capillary: 164 mg/dL — ABNORMAL HIGH (ref 70–99)
Glucose-Capillary: 114 mg/dL — ABNORMAL HIGH (ref 70–99)
Glucose-Capillary: 98 mg/dL (ref 70–99)

## 2020-08-25 LAB — MAGNESIUM: Magnesium: 2.5 mg/dL — ABNORMAL HIGH (ref 1.7–2.4)

## 2020-08-25 MED ORDER — METOLAZONE 5 MG PO TABS
5.0000 mg | ORAL_TABLET | Freq: Once | ORAL | Status: AC
  Administered 2020-08-25: 5 mg via ORAL
  Filled 2020-08-25: qty 1

## 2020-08-25 NOTE — Evaluation (Signed)
Occupational Therapy Evaluation Patient Details Name: [name] MRN: [id number] DOB: Jan 10, 1932 Today's Date: 08/25/2020    History of Present Illness [name] is a 86 year old male with a PMHx significant for HTN, HLD, COPD, OSA, CKD 3b, recent diagnosis of afib (on Eliquis 2.5 mg), CAD s/p CABG, and HFpEF who presents for worsening shortness of breath and leg swelling. Patient states that after discharge, he was 'feeling good.' He states earlier last weekend he went to a funeral where he had some catering with baked chicken, rice and beans. After the weekend, he began to have worsening leg swelling, shortness of breath and weakness. He mentions that he also noted increased swelling in his groin and his penis which caused him to have difficulty urinating   Clinical Impression   PTA, pt was living with his wife and was independent with BADLs; reports poor activity tolerance and use of cane recently. Pt currently requiring Min Guard A for LB ADLs and functional mobility. Presenting with one episode of LOB during mobility in hall and requiring Min A for balance correction. Pt presenting with poor activity tolerance as seen by decrease in SpO2; SpO2 dropping to 85% on 4L and pt requiring seated rest break (~1-2 min), purse lip breathing, and 6L to recover back to 90%. Notified RN. Pt would benefit from further acute OT to facilitate safe dc. Recommend dc to home with HHOT for further OT to optimize safety, independence with ADLs, and return to PLOF.     Follow Up Recommendations  Home health OT;Supervision/Assistance - 24 hour    Equipment Recommendations  None recommended by OT    Recommendations for Other Services PT consult     Precautions / Restrictions Precautions Precautions: Fall      Mobility Bed Mobility               General bed mobility comments: pt in chair on arrival and end of session  Transfers Overall transfer level: Needs assistance Equipment used:  None Transfers: Sit to/from Stand Sit to Stand: Min guard         General transfer comment: Min Guard A for safety    Balance Overall balance assessment: Mild deficits observed, not formally tested Sitting-balance support: Feet supported Sitting balance-Leahy Scale: Good     Standing balance support: During functional activity;No upper extremity supported;Single extremity supported Standing balance-Leahy Scale: Fair Standing balance comment: Pt reaching out for objects. Benefits from single hand held A and using IV pole during mobility. One episode of LOB and requiring Min A for fall prevention                           ADL either performed or assessed with clinical judgement   ADL Overall ADL's : Needs assistance/impaired Eating/Feeding: Set up;Sitting   Grooming: Set up;Sitting;Brushing hair   Upper Body Bathing: Supervision/ safety;Set up;Sitting   Lower Body Bathing: Min guard;Sit to/from stand   Upper Body Dressing : Supervision/safety;Set up;Sitting   Lower Body Dressing: Min guard;Sit to/from stand   Toilet Transfer: Min guard;Ambulation (simulated to recliner)   Toileting- Clothing Manipulation and Hygiene: Supervision/safety;Sit to/from stand       Functional mobility during ADLs: Min guard (Single hand held A at IV pole) General ADL Comments: Pt presenting with decreased balance and activity tolerance. Pt using purse lip breathing techniques. Pt with one LOB duirng mobility in hallway requiring Min A for correction. SpO2 dropping to 85% on 4L during  mobility and requiring seated rest break and 6L to recover back to 90s     Vision Baseline Vision/History: Wears glasses Wears Glasses: Reading only Patient Visual Report: No change from baseline       Perception     Praxis      Pertinent Vitals/Pain Pain Assessment: No/denies pain     Hand Dominance Right   Extremity/Trunk Assessment Upper Extremity Assessment Upper Extremity Assessment:  Overall WFL for tasks assessed   Lower Extremity Assessment Lower Extremity Assessment: Overall WFL for tasks assessed   Cervical / Trunk Assessment Cervical / Trunk Assessment: Normal   Communication Communication Communication: HOH   Cognition Arousal/Alertness: Awake/alert Behavior During Therapy: WFL for tasks assessed/performed Overall Cognitive Status: Within Functional Limits for tasks assessed                                 General Comments: Pt motivated to participate in therapy.    General Comments  SpO2 86% on 3L in supine; upon arrival. Pt SpO2 92% with increase to 4L. During functional mobility, SpO2 dropping to 85% on 4L (with good pleth) - lower to 79% but pleth line poor. Pt requiring seated rest break, purse lip breathing, and 6L O2 to recover back to 90%. At end of session, pt able to maintain SpO2 95% on 3L. Notified RN    Exercises     Shoulder Instructions      Home Living Family/patient expects to be discharged to:: Private residence Living Arrangements: Spouse/significant other Available Help at Discharge: Family;Available 24 hours/day Type of Home: House Home Access: Stairs to enter [employer] of Steps: 2-3   Home Layout: One level     Bathroom Shower/Tub: Occupational psychologist: Standard     Home Equipment: Environmental consultant - 2 wheels;Shower seat   Additional Comments: Receiving HH PT      Prior Functioning/Environment Level of Independence: Independent        Comments: sponge bathes, wife does cooking        OT Problem List: Decreased activity tolerance;Cardiopulmonary status limiting activity      OT Treatment/Interventions: Self-care/ADL training;Patient/family education;Energy conservation    OT Goals(Current goals can be found in the care plan section) Acute Rehab OT Goals Patient Stated Goal: to return home OT Goal Formulation: With patient Time For Goal Achievement: 09/08/20 Potential to  Achieve Goals: Good  OT Frequency: Min 2X/week   Barriers to D/C:            Co-evaluation              AM-PAC OT "6 Clicks" Daily Activity     Outcome Measure Help from another person eating meals?: None Help from another person taking care of personal grooming?: A Little Help from another person toileting, which includes using toliet, bedpan, or urinal?: A Little Help from another person bathing (including washing, rinsing, drying)?: A Little Help from another person to put on and taking off regular upper body clothing?: A Little Help from another person to put on and taking off regular lower body clothing?: A Little 6 Click Score: 19   End of Session Equipment Utilized During Treatment: Oxygen;Gait belt (3-6L) Nurse Communication: Mobility status (O2)  Activity Tolerance: Patient tolerated treatment well Patient left: in chair;with call bell/phone within reach  OT Visit Diagnosis: Unsteadiness on feet (R26.81);Other abnormalities of gait and mobility (R26.89);Muscle weakness (generalized) (M62.81) (cardio pulmonary limiting activity)  Time: 2761-4709 OT Time Calculation (min): 28 min Charges:  OT General Charges $OT Visit: 1 Visit OT Evaluation $OT Eval Moderate Complexity: 1 Mod OT Treatments $Self Care/Home Management : 8-22 mins  Cree Napoli MSOT, OTR/L Acute Rehab Pager: [phone] Office: [place] 08/25/2020, 9:35 AM

## 2020-08-25 NOTE — Progress Notes (Signed)
HD#2 Subjective:  Overnight Events: Patient had only small amount of urine output overnight.   Patient states that he is feeling a little better today then when he presented. He states that he is not as short of breath. I counseled him regarding his kidney function and share my concerned regarding his small amount of urine output overnight. He admits to lower extremity swelling and abdominal fullness, but denies chest pain.   Objective:  Vital signs in last 24 hours: Vitals:   08/24/20 1644 08/24/20 2135 08/25/20 0013 08/25/20 0610  BP: (!) 141/64 135/71 118/62 (!) 144/66  Pulse: (!) 58 65 73 71  Resp: 20 19 19 19   Temp: 98.9 F (37.2 C) 98.6 F (37 C) 98.4 F (36.9 C) 98.7 F (37.1 C)  TempSrc: Oral Oral Oral Oral  SpO2: 92% 91% 90% 90%  Weight:    93.1 kg  Height:       Supplemental O2: Nasal Cannula SpO2: 90 % O2 Flow Rate (L/min): 3 L/min FiO2 (%): 28 %   Physical Exam:  Physical Exam Constitutional:      Appearance: Normal appearance.  HENT:     Head: Normocephalic and atraumatic.  Eyes:     Extraocular Movements: Extraocular movements intact.  Neck:     Vascular: JVD present.  Cardiovascular:     Rate and Rhythm: Normal rate.     Pulses: Normal pulses.     Heart sounds: Normal heart sounds.  Pulmonary:     Effort: Pulmonary effort is normal.     Breath sounds: Normal breath sounds.     Comments: Breathing is less labored today without tachypnea  Abdominal:     General: Bowel sounds are normal. There is distension (feels bloated).     Palpations: Abdomen is soft.     Tenderness: There is no abdominal tenderness.  Musculoskeletal:        General: Normal range of motion.     Cervical back: Normal range of motion.     Right lower leg: No edema.     Left lower leg: No edema.     Comments: 2-3+ pitting edema with scrotal edema. Minimal improvement.  Skin:    General: Skin is warm and dry.  Neurological:     Mental Status: He is alert and oriented to  person, place, and time. Mental status is at baseline.  Psychiatric:        Mood and Affect: Mood normal.     Filed Weights   08/24/20 0054 08/25/20 0610  Weight: 93.9 kg 93.1 kg     Intake/Output Summary (Last 24 hours) at 08/25/2020 0623 Last data filed at 08/25/2020 0600 Gross per 24 hour  Intake 720 ml  Output 1200 ml  Net -480 ml   Net IO Since Admission: -480 mL [08/25/20 0623]  Pertinent Labs: CBC Latest Ref Rng & Units 08/23/2020 08/16/2020 08/14/2020  WBC 4.0 - 10.5 K/uL 7.3 6.1 7.1  Hemoglobin 13.0 - 17.0 g/dL 12.7(L) 13.1 14.2  Hematocrit 39 - 52 % 41.7 41.3 44.8  Platelets 150 - 400 K/uL 192 152 191    CMP Latest Ref Rng & Units 08/25/2020 08/24/2020 08/24/2020  Glucose 70 - 99 mg/dL 154(H) 118(H) 96  BUN 8 - 23 mg/dL 60(H) 60(H) 61(H)  Creatinine 0.61 - 1.24 mg/dL 3.30(H) 3.51(H) 3.43(H)  Sodium 135 - 145 mmol/L 139 141 141  Potassium 3.5 - 5.1 mmol/L 4.8 5.3(H) 5.8(H)  Chloride 98 - 111 mmol/L 106 107 109  CO2 22 -  32 mmol/L 24 24 22   Calcium 8.9 - 10.3 mg/dL 8.7(L) 9.0 9.2  Total Protein 6.5 - 8.1 g/dL - - -  Total Bilirubin 0.3 - 1.2 mg/dL - - -  Alkaline Phos 38 - 126 U/L - - -  AST 15 - 41 U/L - - -  ALT 0 - 44 U/L - - -    Imaging: No results found.  Assessment/Plan:   Principal Problem:   Acute on chronic right heart failure (HCC) Active Problems:   Acute on chronic renal failure (HCC)   OSA (obstructive sleep apnea)   Diabetes ([place])   Patient Summary: Mr.Coxe is a 37 year oldmalewith a PMHx significant for HTN, HLD, COPD, OSA, CKD 3b, recent diagnosis of afib(onEliquis 2.5 mg),CAD s/p CABG, and HFpEF whopresented with signs and symptoms of hypervolemia and admitted for acute heart failure exacerbation complicated by cardiorenal syndrome.   Acute on Chronic Right Heart Failure and HFpEF Pateint has some symptomatic improvementis the patients third admission for heart failure exacerbation in the last 5weeks. Patient's heart failure  likely secondary to new onset a fib, but will work him up for cardiac amyloidosis due to significant heart hypertrophy with low voltage EKGs. - IV Lasix infusion 20 mg/hr + Metolazone 5 mg  - Serum IFE, light chains now. PYP scan later in the week to rule out amyloidosis. - Strict I&O and Daily weight  Acute on Chronic Renal Failure Mild improvement of renal function with Cr of 3.5>3.3 (GFR of 18 today). Baseline is CR of 2.07 (GFR of 33). He is have poor diuresis despite lasix drip and metolazone. Will consult nephrology for potential need for HD.  -Continue diuresis with IV Lasix ggt - Will repeat BMP tomorrow morning   HTN BP today is 145/78. Holding his ACE/ARB in the setting acute kidney injury. --Cont home clonidine0.1 mg BID  Diet: Carb/Renal IVF: None,None VTE: NOAC Code: Full PT/OT recs: Pending, none. TOC recs: pending    Dispo: Anticipated discharge to TBD pending further evaluation and management.    Please contact the on call pager after 5 pm and on weekends at [phone].

## 2020-08-25 NOTE — Consult Note (Signed)
[place]  Reason for Consultation: AKI on CKD, aid in diuresis Requesting Provider: Dr. Evette Doffing  HPI: [name] is an 86 y.o. male with HTN, DM, CKD 3, CHF, CAD CABG '96, recent A fib, CHF (normal EF, grade 2 DD, mod dec RV function - 07/2020) who is seen for evaluation and management of AKI on CKD.   Several recent admissions for volume overload and diuresis - this is 3rd admit since mid 07/2020.  Typically cr in the 1.7-2 range during admission.  He was last d/c'd 9/10 with Cr 2.07 and returned 9/17 with volume overload and Cr 3.7.  ACEi held.  He's been receiving IV lasix - 60 > 120mg  then lasix gtt 20 starting yesterday afternoon + metolazone 5mg  daily.  Net negative 956mL for admission based on I/Os.  Wt 93.9 > 93.1kg today.    He is on supplemental O2 with edema and orthopnea reported.   He lives at home with wife of 60+ years.  Drives, cooks.  Says not much sodium in diet.   PMH: Past Medical History:  Diagnosis Date  . Chronic kidney disease    tumor right kidney  . Coronary artery disease   . Diabetes mellitus    type 2  . Hypertension   . Myocardial infarction ([place])    1997  . Right renal mass    PSH: Past Surgical History:  Procedure Laterality Date  . CARDIAC CATHETERIZATION  1996  . CORONARY ARTERY BYPASS GRAFT    . DOPPLER ECHOCARDIOGRAPHY  2011  . HERNIA REPAIR    . IR GENERIC HISTORICAL  12/09/2016   IR RADIOLOGIST EVAL & MGMT 12/09/2016 [name], MD [facility] INTERV RAD  . ROBOTIC ASSITED PARTIAL NEPHRECTOMY Right 02/19/2017   Procedure: XI ROBOTIC ASSITED PARTIAL NEPHRECTOMY;  Surgeon: [name], MD;  Location: [facility];  Service: Urology;  Laterality: Right;     Past Medical History:  Diagnosis Date  . Chronic kidney disease    tumor right kidney  . Coronary artery disease   . Diabetes mellitus    type 2  . Hypertension   . Myocardial infarction ([place])    1997  . Right renal mass     Medications:  I  have reviewed the patient's current medications.  Medications Prior to Admission  Medication Sig Dispense Refill  . acetaminophen (TYLENOL) 325 MG tablet Take 650 mg by mouth every 6 (six) hours as needed (for pain.).    Marland Kitchen aspirin EC 81 MG tablet Take 81 mg by mouth daily. Swallow whole.    . benazepril (LOTENSIN) 40 MG tablet Take 40 mg by mouth daily.  0  . carvedilol (COREG) 6.25 MG tablet Take 1 tablet (6.25 mg total) by mouth 2 (two) times daily with a meal. 60 tablet 0  . cloNIDine (CATAPRES) 0.1 MG tablet Take 0.1 mg by mouth 2 (two) times daily.    Marland Kitchen ezetimibe (ZETIA) 10 MG tablet Take 10 mg by mouth daily.    . furosemide (LASIX) 20 MG tablet Take 4 tablets (80 mg total) by mouth 2 (two) times daily. 240 tablet 0  . HUMALOG KWIKPEN 100 UNIT/ML KiwkPen Inject 6-8 Units into the skin 3 (three) times daily.   0  . Icosapent Ethyl (VASCEPA) 1 g CAPS Take 2 capsules (2 g total) by mouth 2 (two) times daily. 120 capsule 11  . LANTUS SOLOSTAR 100 UNIT/ML Solostar Pen Inject 18 Units into the skin every evening.     Marland Kitchen  potassium chloride (KLOR-CON) 10 MEQ tablet Take 2 tablets (20 mEq total) by mouth 2 (two) times daily. 120 tablet 0  . Propylene Glycol (SYSTANE BALANCE) 0.6 % SOLN Place 1 drop into both eyes daily as needed (dry eyes).    . rosuvastatin (CRESTOR) 20 MG tablet TAKE 1 TABLET BY MOUTH EVERY DAY 90 tablet 1  . SYNTHROID 100 MCG tablet Take 100 mcg by mouth daily.   0  . apixaban (ELIQUIS) 2.5 MG TABS tablet Take 1 tablet (2.5 mg total) by mouth 2 (two) times daily. 60 tablet 6  . nadolol (CORGARD) 40 MG tablet Take 80 mg by mouth daily. (Patient not taking: Reported on 08/24/2020)      ALLERGIES:  No Known Allergies  FAM HX: Family History  Problem Relation Age of Onset  . Diabetes Mother   . Diabetes Sister     Social History:   reports that he quit smoking about 23 years ago. His smoking use included cigarettes. He has a 13.50 pack-year smoking history. He has never  used smokeless tobacco. He reports that he does not drink alcohol and does not use drugs.  ROS: 12 system ROS per HPI above  Blood pressure 139/76, pulse 83, temperature 98.5 F (36.9 C), temperature source Oral, resp. rate 18, height 5\' 9"  (1.753 m), weight 93.1 kg, SpO2 92 %. PHYSICAL EXAM: Gen: elderly man sitting in bedside chair  Eyes: muddy slcera ENT: MMM Neck: JVD to jaw upright CV:  Irreg, no rub, II/VI SEM Abd: soft, mild dist, nontender Lungs: dec BS bases, rales1/3 up, sl ^ WOB GU: no foley Extr:  2+ pitting LE edema Neuro: Hard of hearing, grossly nonfocal Skin: cool and dry   Results for orders placed or performed during the hospital encounter of 08/23/20 (from the past 48 hour(s))  CBC with Differential     Status: Abnormal   Collection Time: 08/23/20  1:31 PM  Result Value Ref Range   WBC 7.3 4.0 - 10.5 K/uL   RBC 4.66 4.22 - 5.81 MIL/uL   Hemoglobin 12.7 (L) 13.0 - 17.0 g/dL   HCT 41.7 39 - 52 %   MCV 89.5 80.0 - 100.0 fL   MCH 27.3 26.0 - 34.0 pg   MCHC 30.5 30.0 - 36.0 g/dL   RDW 16.8 (H) 11.5 - 15.5 %   Platelets 192 150 - 400 K/uL   nRBC 0.0 0.0 - 0.2 %   Neutrophils Relative % 80 %   Neutro Abs 5.9 1.7 - 7.7 K/uL   Lymphocytes Relative 9 %   Lymphs Abs 0.6 (L) 0.7 - 4.0 K/uL   Monocytes Relative 8 %   Monocytes Absolute 0.6 0 - 1 K/uL   Eosinophils Relative 2 %   Eosinophils Absolute 0.1 0 - 0 K/uL   Basophils Relative 1 %   Basophils Absolute 0.0 0 - 0 K/uL   Immature Granulocytes 0 %   Abs Immature Granulocytes 0.03 0.00 - 0.07 K/uL    Comment: Performed at [facility], [address] [place], [place] [address]  Comprehensive metabolic panel     Status: Abnormal   Collection Time: 08/23/20  1:31 PM  Result Value Ref Range   Sodium 140 135 - 145 mmol/L   Potassium 6.0 (H) 3.5 - 5.1 mmol/L   Chloride 109 98 - 111 mmol/L   CO2 23 22 - 32 mmol/L   Glucose, Bld 85 70 - 99 mg/dL    Comment: Glucose reference range applies only  to samples  taken after fasting for at least 8 hours.   BUN 56 (H) 8 - 23 mg/dL   Creatinine, Ser 3.69 (H) 0.61 - 1.24 mg/dL   Calcium 8.9 8.9 - 10.3 mg/dL   Total Protein 6.6 6.5 - 8.1 g/dL   Albumin 3.3 (L) 3.5 - 5.0 g/dL   AST 22 15 - 41 U/L   ALT 18 0 - 44 U/L   Alkaline Phosphatase 109 38 - 126 U/L   Total Bilirubin 0.8 0.3 - 1.2 mg/dL   GFR calc non Af Amer 14 (L) >60 mL/min   GFR calc Af Amer 16 (L) >60 mL/min   Anion gap 8 5 - 15    Comment: Performed at [facility] [address], [place], [place] [address]  Brain natriuretic peptide     Status: Abnormal   Collection Time: 08/23/20  1:31 PM  Result Value Ref Range   B Natriuretic Peptide 453.4 (H) 0.0 - 100.0 pg/mL    Comment: Performed at [facility] [address], [place], [place] [address]  SARS Coronavirus 2 by RT PCR (hospital order, performed in [facility] hospital lab) Nasopharyngeal Nasopharyngeal Swab     Status: None   Collection Time: 08/23/20  8:55 PM   Specimen: Nasopharyngeal Swab  Result Value Ref Range   SARS Coronavirus 2 NEGATIVE NEGATIVE    Comment: (NOTE) SARS-CoV-2 target nucleic acids are NOT DETECTED.  The SARS-CoV-2 RNA is generally detectable in upper and lower respiratory specimens during the acute phase of infection. The lowest concentration of SARS-CoV-2 viral copies this assay can detect is 250 copies / mL. A negative result does not preclude SARS-CoV-2 infection and should not be used as the sole basis for treatment or other patient management decisions.  A negative result may occur with improper specimen collection / handling, submission of specimen other than nasopharyngeal swab, presence of viral mutation(s) within the areas targeted by this assay, and inadequate number of viral copies (<250 copies / mL). A negative result must be combined with clinical observations, patient history, and epidemiological information.  Fact Sheet for Patients:    StrictlyIdeas.no  Fact Sheet for Healthcare Providers: BankingDealers.co.za  This test is not yet approved or  cleared by the [place] FDA and has been authorized for detection and/or diagnosis of SARS-CoV-2 by FDA under an Emergency Use Authorization (EUA).  This EUA will remain in effect (meaning this test can be used) for the duration of the COVID-19 declaration under Section 564(b)(1) of the Act, 21 U.S.C. section 360bbb-3(b)(1), unless the authorization is terminated or revoked sooner.  Performed at [facility], [place] [address], [place],  [address]   Urinalysis, Routine w reflex microscopic Urine, Clean Catch     Status: Abnormal   Collection Time: 08/23/20  9:03 PM  Result Value Ref Range   Color, Urine YELLOW YELLOW   APPearance CLEAR CLEAR   Specific Gravity, Urine 1.011 1.005 - 1.030   pH 5.0 5.0 - 8.0   Glucose, UA NEGATIVE NEGATIVE mg/dL   Hgb urine dipstick NEGATIVE NEGATIVE   Bilirubin Urine NEGATIVE NEGATIVE   Ketones, ur NEGATIVE NEGATIVE mg/dL   Protein, ur 30 (A) NEGATIVE mg/dL   Nitrite NEGATIVE NEGATIVE   Leukocytes,Ua NEGATIVE NEGATIVE   RBC / HPF 0-5 0 - 5 RBC/hpf   WBC, UA 0-5 0 - 5 WBC/hpf   Bacteria, UA NONE SEEN NONE SEEN   Squamous Epithelial / LPF 0-5 0 - 5  Comment: Performed at [facility], [place] [address], [place], [place] [address]  Potassium     Status: Abnormal   Collection Time: 08/23/20  9:05 PM  Result Value Ref Range   Potassium 6.3 (HH) 3.5 - 5.1 mmol/L    Comment: CRITICAL RESULT CALLED TO, READ BACK BY AND VERIFIED WITH: RN L HILL @2140  08/23/20 BY S GEZAHEGN Performed at [facility], [place] [address] [place], [place] [address]   CBG monitoring, ED     Status: Abnormal   Collection Time: 08/23/20 10:04 PM  Result Value Ref Range   Glucose-Capillary 48 (L) 70 - 99 mg/dL    Comment: Glucose reference range applies only to samples taken after fasting for  at least 8 hours.   Comment 1 Notify RN   CBG monitoring, ED     Status: Abnormal   Collection Time: 08/23/20 11:05 PM  Result Value Ref Range   Glucose-Capillary 147 (H) 70 - 99 mg/dL    Comment: Glucose reference range applies only to samples taken after fasting for at least 8 hours.  Basic metabolic panel     Status: Abnormal   Collection Time: 08/24/20  4:46 AM  Result Value Ref Range   Sodium 140 135 - 145 mmol/L   Potassium 6.4 (HH) 3.5 - 5.1 mmol/L    Comment: CRITICAL RESULT CALLED TO, READ BACK BY AND VERIFIED WITH: RN E EDOH @0611  08/24/20 BY S GEZAHEGN    Chloride 107 98 - 111 mmol/L   CO2 22 22 - 32 mmol/L   Glucose, Bld 138 (H) 70 - 99 mg/dL    Comment: Glucose reference range applies only to samples taken after fasting for at least 8 hours.   BUN 61 (H) 8 - 23 mg/dL   Creatinine, Ser 3.68 (H) 0.61 - 1.24 mg/dL   Calcium 9.0 8.9 - 10.3 mg/dL   GFR calc non Af Amer 14 (L) >60 mL/min   GFR calc Af Amer 16 (L) >60 mL/min   Anion gap 11 5 - 15    Comment: Performed at [facility] [address], [place],  [address]  Magnesium     Status: Abnormal   Collection Time: 08/24/20  4:46 AM  Result Value Ref Range   Magnesium 2.5 (H) 1.7 - 2.4 mg/dL    Comment: Performed at [facility] [address], [place], [place] [address]  Glucose, capillary     Status: Abnormal   Collection Time: 08/24/20  6:58 AM  Result Value Ref Range   Glucose-Capillary 146 (H) 70 - 99 mg/dL    Comment: Glucose reference range applies only to samples taken after fasting for at least 8 hours.  Glucose, capillary     Status: Abnormal   Collection Time: 08/24/20 11:51 AM  Result Value Ref Range   Glucose-Capillary 128 (H) 70 - 99 mg/dL    Comment: Glucose reference range applies only to samples taken after fasting for at least 8 hours.   Comment 1 Notify RN    Comment 2 Document in Chart   Basic metabolic panel     Status: Abnormal   Collection Time: 08/24/20 12:36 PM   Result Value Ref Range   Sodium 141 135 - 145 mmol/L   Potassium 5.5 (H) 3.5 - 5.1 mmol/L   Chloride 109 98 - 111 mmol/L   CO2 20 (L) 22 - 32 mmol/L   Glucose, Bld 86 70 - 99 mg/dL    Comment:  Glucose reference range applies only to samples taken after fasting for at least 8 hours.   BUN 59 (H) 8 - 23 mg/dL   Creatinine, Ser 3.71 (H) 0.61 - 1.24 mg/dL   Calcium 8.9 8.9 - 10.3 mg/dL   GFR calc non Af Amer 14 (L) >60 mL/min   GFR calc Af Amer 16 (L) >60 mL/min   Anion gap 12 5 - 15    Comment: Performed at [facility] [address], [place], [place] [address]  Basic metabolic panel     Status: Abnormal   Collection Time: 08/24/20  4:11 PM  Result Value Ref Range   Sodium 141 135 - 145 mmol/L   Potassium 5.8 (H) 3.5 - 5.1 mmol/L   Chloride 109 98 - 111 mmol/L   CO2 22 22 - 32 mmol/L   Glucose, Bld 96 70 - 99 mg/dL    Comment: Glucose reference range applies only to samples taken after fasting for at least 8 hours.   BUN 61 (H) 8 - 23 mg/dL   Creatinine, Ser 3.43 (H) 0.61 - 1.24 mg/dL   Calcium 9.2 8.9 - 10.3 mg/dL   GFR calc non Af Amer 15 (L) >60 mL/min   GFR calc Af Amer 18 (L) >60 mL/min   Anion gap 10 5 - 15    Comment: Performed at [facility] [address], [place], [place] [address]  Glucose, capillary     Status: None   Collection Time: 08/24/20  4:41 PM  Result Value Ref Range   Glucose-Capillary 98 70 - 99 mg/dL    Comment: Glucose reference range applies only to samples taken after fasting for at least 8 hours.  Basic metabolic panel     Status: Abnormal   Collection Time: 08/24/20  8:45 PM  Result Value Ref Range   Sodium 141 135 - 145 mmol/L   Potassium 5.3 (H) 3.5 - 5.1 mmol/L   Chloride 107 98 - 111 mmol/L   CO2 24 22 - 32 mmol/L   Glucose, Bld 118 (H) 70 - 99 mg/dL    Comment: Glucose reference range applies only to samples taken after fasting for at least 8 hours.   BUN 60 (H) 8 - 23 mg/dL   Creatinine, Ser 3.51 (H) 0.61 - 1.24 mg/dL    Calcium 9.0 8.9 - 10.3 mg/dL   GFR calc non Af Amer 15 (L) >60 mL/min   GFR calc Af Amer 17 (L) >60 mL/min   Anion gap 10 5 - 15    Comment: Performed at [facility] [address] [place], [place] [address]  Glucose, capillary     Status: Abnormal   Collection Time: 08/24/20  9:39 PM  Result Value Ref Range   Glucose-Capillary 108 (H) 70 - 99 mg/dL    Comment: Glucose reference range applies only to samples taken after fasting for at least 8 hours.  Basic metabolic panel     Status: Abnormal   Collection Time: 08/25/20 12:54 AM  Result Value Ref Range   Sodium 139 135 - 145 mmol/L   Potassium 4.8 3.5 - 5.1 mmol/L   Chloride 106 98 - 111 mmol/L   CO2 24 22 - 32 mmol/L   Glucose, Bld 154 (H) 70 - 99 mg/dL    Comment: Glucose reference range applies only to samples taken after fasting for at least 8 hours.   BUN 60 (H) 8 - 23 mg/dL   Creatinine, Ser 3.30 (  H) 0.61 - 1.24 mg/dL   Calcium 8.7 (L) 8.9 - 10.3 mg/dL   GFR calc non Af Amer 16 (L) >60 mL/min   GFR calc Af Amer 19 (L) >60 mL/min   Anion gap 9 5 - 15    Comment: Performed at [facility] [address] [place], [place] [address]  Glucose, capillary     Status: Abnormal   Collection Time: 08/25/20  7:21 AM  Result Value Ref Range   Glucose-Capillary 184 (H) 70 - 99 mg/dL    Comment: Glucose reference range applies only to samples taken after fasting for at least 8 hours.    DG Chest 2 View  Result Date: 08/23/2020 CLINICAL DATA:  Short of breath.  History of CHF EXAM: CHEST - 2 VIEW COMPARISON:  08/08/2020 FINDINGS: Cardiac enlargement with prior CABG. Pulmonary vascular congestion with mild interval improvement. Mild edema with improvement. Bilateral pleural effusions left greater than right unchanged. Bibasilar atelectasis left greater than right unchanged. IMPRESSION: Mild improvement in vascular congestion and edema. Findings compatible with heart failure with bilateral effusions left greater than right.  Electronically Signed   By: Franchot Gallo M.D.   On: 08/23/2020 13:57   US RENAL  Result Date: 08/23/2020 CLINICAL DATA:  Acute kidney injury EXAM: RENAL / URINARY TRACT ULTRASOUND COMPLETE COMPARISON:  None. FINDINGS: Right Kidney: Renal measurements: 10.3 x 4.9 x 6.0 cm = volume: 157 mL. Echogenicity within normal limits. Cortical scarring seen in the upper pole which could be from prior partial nephrectomy. No mass or hydronephrosis visualized. Left Kidney: Renal measurements: 8.9 x 4.9 x 5 4 cm = volume: 121 mL. Echogenicity within normal limits. Anechoic cysts are seen within the left kidney the largest measuring 2.6 x 1.9 x 2.2 cm. Bladder: Appears normal for degree of bladder distention. Other: A small right pleural effusion is present. There is a small amount of perihepatic ascites present. IMPRESSION: Cortical scarring in the upper pole the right kidney, likely from partial nephrectomy. Left renal cysts Small right pleural effusion and small amount of perihepatic ascites. Electronically Signed   By: Prudencio Pair M.D.   On: 08/23/2020 21:26    Assessment/Plan **AKI on CKD:  Appears he has baseline Cr 1.7-2 recently and on presentation with Cr 3.9 in setting of decompensated heart failure consistent with cardiorenal syndrome.  His blood pressures are reasonable.  I agree to hold ACEi.  He needs diuresis so I would proceed at this time, see below.  He currently has no indications for dialysis, but we discussed should need for dialysis arise --> he would proceed, making it very clear he would not choose conservative care.   I think the likelihood of this is quite high, if not in this hospitalization, in the next 6 months.  Avoid nephrotoxins as able.    **acute on chronic HF:  Has RV compromise and diastolic dysfunction.  Certainly demonstrating with recent admission achieving euvolemia is challenging.  Started lasix gtt yesterday but didn't get a hefty bolus prior to initiation so will give lasix  160 IV now + metolazone later if not diuresing. Low na/fluid restrict.  Being eval for amyloid.  **Hyperkalemia: resolved with lokelma and holding ACEi  ** A fib:  On eliquis, not on BB due to bradycardia.  Team d/w cardiology re: cardoversion.  Justin Mend 08/25/2020, 10:54 AM

## 2020-08-26 ENCOUNTER — Inpatient Hospital Stay (HOSPITAL_COMMUNITY): Payer: Medicare Other

## 2020-08-26 LAB — BASIC METABOLIC PANEL
Anion gap: 11 (ref 5–15)
BUN: 61 mg/dL — ABNORMAL HIGH (ref 8–23)
CO2: 25 mmol/L (ref 22–32)
Calcium: 8.7 mg/dL — ABNORMAL LOW (ref 8.9–10.3)
Chloride: 103 mmol/L (ref 98–111)
Creatinine, Ser: 3.24 mg/dL — ABNORMAL HIGH (ref 0.61–1.24)
GFR calc Af Amer: 19 mL/min — ABNORMAL LOW (ref >60)
GFR calc non Af Amer: 16 mL/min — ABNORMAL LOW (ref >60)
Glucose, Bld: 177 mg/dL — ABNORMAL HIGH (ref 70–99)
Potassium: 3.7 mmol/L (ref 3.5–5.1)
Sodium: 139 mmol/L (ref 135–145)

## 2020-08-26 LAB — MAGNESIUM: Magnesium: 2.3 mg/dL (ref 1.7–2.4)

## 2020-08-26 LAB — GLUCOSE, CAPILLARY
Glucose-Capillary: 153 mg/dL — ABNORMAL HIGH (ref 70–99)
Glucose-Capillary: 195 mg/dL — ABNORMAL HIGH (ref 70–99)
Glucose-Capillary: 149 mg/dL — ABNORMAL HIGH (ref 70–99)
Glucose-Capillary: 143 mg/dL — ABNORMAL HIGH (ref 70–99)
Glucose-Capillary: 153 mg/dL — ABNORMAL HIGH (ref 70–99)

## 2020-08-26 MED ORDER — METOLAZONE 5 MG PO TABS
5.0000 mg | ORAL_TABLET | Freq: Every day | ORAL | Status: DC
  Administered 2020-08-26 – 2020-08-27 (×2): 5 mg via ORAL
  Filled 2020-08-26 (×3): qty 1

## 2020-08-26 MED ORDER — IPRATROPIUM-ALBUTEROL 0.5-2.5 (3) MG/3ML IN SOLN
3.0000 mL | Freq: Two times a day (BID) | RESPIRATORY_TRACT | Status: DC
  Administered 2020-08-26 – 2020-08-27 (×3): 3 mL via RESPIRATORY_TRACT
  Filled 2020-08-26 (×3): qty 3

## 2020-08-26 MED ORDER — POTASSIUM CHLORIDE 20 MEQ PO PACK: 20.0000 meq | PACK | Freq: Once | ORAL | Status: DC

## 2020-08-26 MED ORDER — ALBUTEROL SULFATE HFA 108 (90 BASE) MCG/ACT IN AERS
1.0000 | INHALATION_SPRAY | RESPIRATORY_TRACT | Status: DC | PRN
  Filled 2020-08-26: qty 6.7

## 2020-08-26 MED ORDER — POTASSIUM CHLORIDE 20 MEQ PO PACK: 20.0000 meq | PACK | Freq: Two times a day (BID) | ORAL | Status: DC

## 2020-08-26 MED ORDER — POTASSIUM CHLORIDE 20 MEQ PO PACK
40.0000 meq | PACK | Freq: Once | ORAL | Status: DC
  Filled 2020-08-26: qty 2

## 2020-08-26 MED ORDER — TECHNETIUM TC 99M PYROPHOSPHATE
19.5000 | Freq: Once | INTRAVENOUS | Status: AC | PRN
  Administered 2020-08-26: 19.5 via INTRAVENOUS
  Filled 2020-08-26: qty 20

## 2020-08-26 NOTE — Progress Notes (Signed)
Physical Therapy Treatment Patient Details Name: KDYN VONBEHREN MRN: [id number] DOB: [birth year] Today's Date: 08/26/2020    History of Present Illness Mr. Antolin is a 86 year old male with a PMHx significant for HTN, HLD, COPD, OSA, CKD 3b, recent diagnosis of afib (on Eliquis 2.5 mg), CAD s/p CABG, and HFpEF who presents for worsening shortness of breath and leg swelling. Patient states that after discharge, he was 'feeling good.' He states earlier last weekend he went to a funeral where he had some catering with baked chicken, rice and beans. After the weekend, he began to have worsening leg swelling, shortness of breath and weakness. He mentions that he also noted increased swelling in his groin and his penis which caused him to have difficulty urinating    PT Comments    Pt demonstrating improved tolerance for ambulation during session; pt continues to have difficulty maintaining O2 levels with constant cueing needed for breathing technique; pt continues to show mild dynamic balance deficits requiring intermittent min guard during ambulation; pt will benefit from skilled PT to address deficits in gait, balance and endurance to maximize independence with functional mobility prior to discharge.     Follow Up Recommendations  Home health PT;Supervision - Intermittent     Equipment Recommendations  None recommended by PT    Recommendations for Other Services       Precautions / Restrictions Precautions Precautions: Fall Precaution Comments: mod fall Restrictions Weight Bearing Restrictions: No    Mobility  Bed Mobility Overal bed mobility: Modified Independent Bed Mobility: Supine to Sit     Supine to sit: Modified independent (Device/Increase time)     General bed mobility comments: pt returned to chair at end of session  Transfers Overall transfer level: Needs assistance Equipment used: None Transfers: Sit to/from Stand Sit to Stand: Supervision Stand pivot transfers:  Supervision          Ambulation/Gait Ambulation/Gait assistance: Supervision;Min guard Gait Distance (Feet): 60 Feet Assistive device: None Gait Pattern/deviations: Step-through pattern;Decreased stride length Gait velocity: decreased   General Gait Details: see general comments regarding drop in O2 during ambulation; pt requiring S intially with ambulation, with continued ambulation min guard needed   Stairs             Wheelchair Mobility    Modified Rankin (Stroke Patients Only)       Balance Overall balance assessment: Mild deficits observed, not formally tested Sitting-balance support: Feet supported Sitting balance-Leahy Scale: Good     Standing balance support: During functional activity;No upper extremity supported Standing balance-Leahy Scale: Fair Standing balance comment: pt able to reach out of BOS during ambulation to move objects with min guard needed                            Cognition Arousal/Alertness: Awake/alert Behavior During Therapy: WFL for tasks assessed/performed Overall Cognitive Status: Within Functional Limits for tasks assessed                                 General Comments: Pt motivated to participate in therapy; Pt O2 at rest on 4L 90%, verbal cueing to increase breathing through nose with O2 increase to 95% on 4L, during ambulation pt initially remained 91% or better, at end of ambulation O2 dropped to 83%, pt requiring standing rest break and an increase to 6L to increase to 93%; upon sitting O2  turned town to 4L; constant verbal cueing needed to increase breathing through nose      Exercises General Exercises - Lower Extremity Long Arc Quad: AROM;Both;Seated;15 reps Hip Flexion/Marching: AROM;Both;Seated;15 reps    General Comments        Pertinent Vitals/Pain Pain Assessment: No/denies pain    Home Living                      Prior Function            PT Goals (current goals  can now be found in the care plan section) Acute Rehab PT Goals Patient Stated Goal: to return home PT Goal Formulation: With patient Time For Goal Achievement: 08/31/20 Potential to Achieve Goals: Good Progress towards PT goals: Progressing toward goals    Frequency    Min 3X/week      PT Plan Current plan remains appropriate    Co-evaluation              AM-PAC PT "6 Clicks" Mobility   Outcome Measure  Help needed turning from your back to your side while in a flat bed without using bedrails?: None Help needed moving from lying on your back to sitting on the side of a flat bed without using bedrails?: None Help needed moving to and from a bed to a chair (including a wheelchair)?: A Little Help needed standing up from a chair using your arms (e.g., wheelchair or bedside chair)?: None Help needed to walk in hospital room?: A Little Help needed climbing 3-5 steps with a railing? : A Little 6 Click Score: 21    End of Session Equipment Utilized During Treatment: Gait belt;Oxygen Activity Tolerance: Patient tolerated treatment well Patient left: in chair;with call bell/phone within reach Nurse Communication: Mobility status PT Visit Diagnosis: Difficulty in walking, not elsewhere classified (R26.2)     Time: 5397-6734 PT Time Calculation (min) (ACUTE ONLY): 22 min  Charges:  $Gait Training: 8-22 mins                     Lyanne Co, DPT Acute Rehabilitation Services [phone]   [place] 08/26/2020, 10:26 AM

## 2020-08-26 NOTE — Progress Notes (Addendum)
HD#3 Subjective:  Overnight Events: None   Patient is sitting up on chair. State he continues to have significant swelling in legs. Continues to feel short of breath while laying but feels well sitting up in chair. Agreeable to CPAP tonight.  Objective:  Vital signs in last 24 hours: Vitals:   08/25/20 2053 08/26/20 0018 08/26/20 0458 08/26/20 0808  BP:  (!) 120/56 127/65 (!) 144/78  Pulse:  (!) 55 71 87  Resp:  15 17   Temp:  97.9 F (36.6 C) 98.2 F (36.8 C)   TempSrc:  Oral Oral   SpO2: (!) 88% 93% 96% 94%  Weight:   90.6 kg   Height:       Supplemental O2: Nasal Cannula SpO2: 94 % O2 Flow Rate (L/min): 4 L/min FiO2 (%): 28 %   Physical Exam:  Physical Exam Constitutional:      Appearance: He is well-developed.  Cardiovascular:     Rate and Rhythm: Bradycardia present. Rhythm irregular.     Comments: Positive hepatojugular reflex Musculoskeletal:     Right lower leg: Edema present.     Left lower leg: Edema present.  Neurological:     Mental Status: He is alert.     Filed Weights   08/24/20 0054 08/25/20 0610 08/26/20 0458  Weight: 93.9 kg 93.1 kg 90.6 kg     Intake/Output Summary (Last 24 hours) at 08/26/2020 0814 Last data filed at 08/26/2020 0600 Gross per 24 hour  Intake 1300 ml  Output 3020 ml  Net -1720 ml   Net IO Since Admission: -2,080 mL [08/26/20 0814]  Pertinent Labs: CBC Latest Ref Rng & Units 08/23/2020 08/16/2020 08/14/2020  WBC 4.0 - 10.5 K/uL 7.3 6.1 7.1  Hemoglobin 13.0 - 17.0 g/dL 12.7(L) 13.1 14.2  Hematocrit 39 - 52 % 41.7 41.3 44.8  Platelets 150 - 400 K/uL 192 152 191    CMP Latest Ref Rng & Units 08/26/2020 08/25/2020 08/25/2020  Glucose 70 - 99 mg/dL 177(H) 146(H) 154(H)  BUN 8 - 23 mg/dL 61(H) 61(H) 60(H)  Creatinine 0.61 - 1.24 mg/dL 3.24(H) 3.37(H) 3.30(H)  Sodium 135 - 145 mmol/L 139 140 139  Potassium 3.5 - 5.1 mmol/L 3.7 4.5 4.8  Chloride 98 - 111 mmol/L 103 104 106  CO2 22 - 32 mmol/L 25 25 24   Calcium 8.9 - 10.3  mg/dL 8.7(L) 8.7(L) 8.7(L)  Total Protein 6.5 - 8.1 g/dL - - -  Total Bilirubin 0.3 - 1.2 mg/dL - - -  Alkaline Phos 38 - 126 U/L - - -  AST 15 - 41 U/L - - -  ALT 0 - 44 U/L - - -   Imaging: No results found.  Assessment/Plan:   Principal Problem:   Acute on chronic right heart failure (HCC) Active Problems:   Acute on chronic renal failure (HCC)   OSA (obstructive sleep apnea)   Diabetes [facility])   Patient Summary: [name] is a 86 year old male with a PMHx significant for HTN, HLD, COPD, OSA, CKD 3b, recent diagnosis of afib (on Eliquis 2.5 mg), CAD s/p CABG, and HFpEF who presented with signs and symptoms of hypervolemia and admitted for acute heart failure exacerbation complicated by cardiorenal syndrome.    Acute on Chronic Right Heart Failure and HFpEF Patient continues to have some symptomatic improvement in SOB. Down 3.3 kg since admission. This is his third admission for heart failure exacerbation in the last 5 weeks. Patient's heart failure likely secondary to new onset  a fib, but will work him up for cardiac amyloidosis due to significant heart hypertrophy with low voltage EKGs. - IV Lasix infusion 20 mg/hr + Metolazone 5 mg  - follow up Serum IFE, light chains - PYP scan ordered - Strict I&O and Daily weight   Acute on Chronic Renal Failure Mild improvement of renal function with Cr of 3.3>3.2 (GFR of 19 today). Baseline is CR of 2.07 (GFR of 33). Improvement in diuresis lasix 160 IV bolus. - Nephrology consulted appreciate recommendations. - Continue diuresis with IV Lasix ggt - Will repeat BMP tomorrow morning  - At risk for requiring Home HD support in the coming months, patient understands and is willing to do whatever is necessary to prolong his life.   Atrial fibrillation CHA2DS2-VASc-5. Home medications eliquis 2.5 mg BID stated on 08/15/2020 and carvedilol 6.25 BID. Normal left atrial size on echo, he does have OSA.  - Continue home eliquis - Rate control  limited due to bradycardia - EKG today - Will talk with cardiology about cardioversion once he is more euvolemic and can lay flat.  OSA On CPAP at home, sleep study in 2018 with mild obstructive sleep apnea -continue CPAP  HTN BP today is 144/78. Holding his ACE/ARB in the setting acute kidney injury. --Cont home clonidine 0.1 mg BID  COPD Last PFT in 2018 with FEV1/FVC 72%  83%, FEV1 75%, without  - Follow up studies in outpatient setting - duonebs TID, PRN albuterol Q4   Diet: Carb/Renal IVF: None,None VTE: NOAC Code: Full PT/OT recs: Pending, none. TOC recs: pending    Dispo: Anticipated discharge to TBD pending further evaluation and management.     Iona Beard, MD 08/26/2020, 8:14 AM Pager: [phone]  Please contact the on call pager after 5 pm and on weekends at [phone].

## 2020-08-26 NOTE — Progress Notes (Signed)
Patient ID: TRYTON BODI, male   DOB: June 07, 1932, 86 y.o.   MRN: [id number] S: Feeling better O:BP (!) 107/49 (BP Location: Left Arm)   Pulse 62   Temp 97.9 F (36.6 C)   Resp 17   Ht 5\' 9"  (1.753 m)   Wt 90.6 kg   SpO2 98%   BMI 29.49 kg/m   Intake/Output Summary (Last 24 hours) at 08/26/2020 1702 Last data filed at 08/26/2020 1700 Gross per 24 hour  Intake 1140 ml  Output 3580 ml  Net -2440 ml   Intake/Output: I/O last 3 completed shifts: In: 1660 [P.O.:1660] Out: 3770 [Urine:3770]  Intake/Output this shift:  Total I/O In: 420 [P.O.:420] Out: 1650 [Urine:1650] Weight change: -2.517 kg Gen: NAD CVS: RRR, no rub Resp: cta Abd: +BS, soft, NT/ND Ext: 1+ BLE edema  Recent Labs  Lab 08/23/20 1331 08/23/20 2105 08/24/20 0446 08/24/20 1236 08/24/20 1611 08/24/20 2045 08/25/20 0054 08/25/20 1310 08/26/20 0623  NA 140  --  140 141 141 141 139 140 139  K 6.0*   < > 6.4* 5.5* 5.8* 5.3* 4.8 4.5 3.7  CL 109  --  107 109 109 107 106 104 103  CO2 23  --  22 20* 22 24 24 25 25   GLUCOSE 85  --  138* 86 96 118* 154* 146* 177*  BUN 56*  --  61* 59* 61* 60* 60* 61* 61*  CREATININE 3.69*  --  3.68* 3.71* 3.43* 3.51* 3.30* 3.37* 3.24*  ALBUMIN 3.3*  --   --   --   --   --   --   --   --   CALCIUM 8.9  --  9.0 8.9 9.2 9.0 8.7* 8.7* 8.7*  AST 22  --   --   --   --   --   --   --   --   ALT 18  --   --   --   --   --   --   --   --    < > = values in this interval not displayed.   Liver Function Tests: Recent Labs  Lab 08/23/20 1331  AST 22  ALT 18  ALKPHOS 109  BILITOT 0.8  PROT 6.6  ALBUMIN 3.3*   No results for input(s): LIPASE, AMYLASE in the last 168 hours. No results for input(s): AMMONIA in the last 168 hours. CBC: Recent Labs  Lab 08/23/20 1331  WBC 7.3  NEUTROABS 5.9  HGB 12.7*  HCT 41.7  MCV 89.5  PLT 192   Cardiac Enzymes: No results for input(s): CKTOTAL, CKMB, CKMBINDEX, TROPONINI in the last 168 hours. CBG: Recent Labs  Lab 08/25/20 1634  08/25/20 2110 08/26/20 0631 08/26/20 1113 08/26/20 1613  GLUCAP 114* 98 153* 195* 149*    Iron Studies: No results for input(s): IRON, TIBC, TRANSFERRIN, FERRITIN in the last 72 hours. Studies/Results: No results found. Marland Kitchen apixaban  2.5 mg Oral BID  . cloNIDine  0.1 mg Oral BID  . ezetimibe  10 mg Oral Daily  . icosapent Ethyl  2 g Oral BID  . insulin aspart  5 Units Subcutaneous TID WC  . ipratropium-albuterol  3 mL Nebulization BID  . levothyroxine  100 mcg Oral Q24H  . metolazone  5 mg Oral Daily  . rosuvastatin  20 mg Oral Daily  . sodium chloride flush  3 mL Intravenous Q12H    BMET    Component Value Date/Time   NA 139 08/26/2020 9563  NA 142 06/02/2019 1644   K 3.7 08/26/2020 0623   CL 103 08/26/2020 0623   CO2 25 08/26/2020 0623   GLUCOSE 177 (H) 08/26/2020 0623   BUN 61 (H) 08/26/2020 0623   BUN 18 06/02/2019 1644   CREATININE 3.24 (H) 08/26/2020 0623   CREATININE 1.54 (H) 03/31/2017 1028   CALCIUM 8.7 (L) 08/26/2020 0623   GFRNONAA 16 (L) 08/26/2020 0623   GFRAA 19 (L) 08/26/2020 0623   CBC    Component Value Date/Time   WBC 7.3 08/23/2020 1331   RBC 4.66 08/23/2020 1331   HGB 12.7 (L) 08/23/2020 1331   HCT 41.7 08/23/2020 1331   PLT 192 08/23/2020 1331   MCV 89.5 08/23/2020 1331   MCH 27.3 08/23/2020 1331   MCHC 30.5 08/23/2020 1331   RDW 16.8 (H) 08/23/2020 1331   LYMPHSABS 0.6 (L) 08/23/2020 1331   MONOABS 0.6 08/23/2020 1331   EOSABS 0.1 08/23/2020 1331   BASOSABS 0.0 08/23/2020 1331   HPI: ORLANDIS SANDEN is an 86 y.o. male with HTN, DM, CKD 3, CHF, CAD CABG '96, recent A fib, CHF (normal EF, grade 2 DD, mod dec RV function - 07/2020) who is seen for evaluation and management of AKI on CKD.   Assessment/Plan:  1. AKI/CKD stage  (baseline Scr 1.7-2) in setting of decompensated CHF consistent cardiorenal syndrome with concomitant ACE inhibition.  Continues to diurese with slight improvement of BUN/CR. 1. Continue to hold ACE and continue to  follow UOP and Scr 2. No indication for dialysis at this time.  Continue with medical management. 2. Acute on chronic combined systolic and diastolic CHF- with decreased RV function and DD.  Didn't respond to lasix drip but doing better with larger dose of 20 mg/hr and metolazone 5 mg daily.  Received 2 doses of IV lasix 120 on 9/19 and 9/20.  Continue with sodium and fluid restriction.  3. Hyperkalemia- resolved off ACE and lokelma 4. A fib on eliquis but no beta-blocker due to bradycardia.  For possible cardioversion pending Cardiology eval.  5. OSA on CPAP 6. HTN- stable 7. COPD- per primary  Donetta Potts, MD [facility] [phone]

## 2020-08-26 NOTE — Consult Note (Signed)
   [facility] Inpatient Consult   08/26/2020  [name] 05/22/1932 [id number]  [facility] [ACO] Patient: Medicare NextGen Primary Care Provider:  Dr. [name] Patient is less than 30 days readmission hospitalization, with extreme high risk scores.  Patient is currently active with [facility] for chronic disease management services.  Patient has been engaged by a [place], Sonda Rumble, RN.   Our community based plan of care has focused on disease management and community resource support and working with patient's wife [name] per [employer].  Admitted with HF exacerbation.  Plan: Will continue to follow progress and for further disposition needs. Notified Inpatient Transition Of Care [TOC] team member in progression meeting today to make aware that [facility] following.   Of note, [facility] Care Management services does not replace or interfere with any services that are needed or arranged by inpatient [facility] care management team.  For additional questions or referrals please contact:  Natividad Brood, RN BSN [facility] Liaison  [phone] business mobile phone Toll free office [phone]  Fax number: [phone] [email or web address]

## 2020-08-27 DIAGNOSIS — I4819 Other persistent atrial fibrillation: Secondary | ICD-10-CM

## 2020-08-27 DIAGNOSIS — I272 Pulmonary hypertension, unspecified: Secondary | ICD-10-CM

## 2020-08-27 DIAGNOSIS — G4733 Obstructive sleep apnea (adult) (pediatric): Secondary | ICD-10-CM

## 2020-08-27 DIAGNOSIS — I50813 Acute on chronic right heart failure: Secondary | ICD-10-CM

## 2020-08-27 DIAGNOSIS — N1832 Chronic kidney disease, stage 3b: Secondary | ICD-10-CM

## 2020-08-27 LAB — BASIC METABOLIC PANEL
Anion gap: 10 (ref 5–15)
Anion gap: 11 (ref 5–15)
BUN: 57 mg/dL — ABNORMAL HIGH (ref 8–23)
BUN: 55 mg/dL — ABNORMAL HIGH (ref 8–23)
CO2: 30 mmol/L (ref 22–32)
CO2: 33 mmol/L — ABNORMAL HIGH (ref 22–32)
Calcium: 8.6 mg/dL — ABNORMAL LOW (ref 8.9–10.3)
Calcium: 8.9 mg/dL (ref 8.9–10.3)
Chloride: 101 mmol/L (ref 98–111)
Chloride: 99 mmol/L (ref 98–111)
Creatinine, Ser: 2.8 mg/dL — ABNORMAL HIGH (ref 0.61–1.24)
Creatinine, Ser: 2.69 mg/dL — ABNORMAL HIGH (ref 0.61–1.24)
GFR calc Af Amer: 23 mL/min — ABNORMAL LOW (ref >60)
GFR calc Af Amer: 24 mL/min — ABNORMAL LOW (ref >60)
GFR calc non Af Amer: 20 mL/min — ABNORMAL LOW (ref >60)
GFR calc non Af Amer: 21 mL/min — ABNORMAL LOW (ref >60)
Glucose, Bld: 215 mg/dL — ABNORMAL HIGH (ref 70–99)
Glucose, Bld: 132 mg/dL — ABNORMAL HIGH (ref 70–99)
Potassium: 3 mmol/L — ABNORMAL LOW (ref 3.5–5.1)
Potassium: 3.7 mmol/L (ref 3.5–5.1)
Sodium: 141 mmol/L (ref 135–145)
Sodium: 143 mmol/L (ref 135–145)

## 2020-08-27 LAB — GLUCOSE, CAPILLARY
Glucose-Capillary: 185 mg/dL — ABNORMAL HIGH (ref 70–99)
Glucose-Capillary: 189 mg/dL — ABNORMAL HIGH (ref 70–99)
Glucose-Capillary: 118 mg/dL — ABNORMAL HIGH (ref 70–99)
Glucose-Capillary: 80 mg/dL (ref 70–99)
Glucose-Capillary: 146 mg/dL — ABNORMAL HIGH (ref 70–99)

## 2020-08-27 LAB — KAPPA/LAMBDA LIGHT CHAINS
Kappa free light chain: 141.9 mg/L — ABNORMAL HIGH (ref 3.3–19.4)
Kappa, lambda light chain ratio: 1.68 — ABNORMAL HIGH (ref 0.26–1.65)
Lambda free light chains: 84.3 mg/L — ABNORMAL HIGH (ref 5.7–26.3)

## 2020-08-27 LAB — MAGNESIUM: Magnesium: 2.1 mg/dL (ref 1.7–2.4)

## 2020-08-27 MED ORDER — IPRATROPIUM-ALBUTEROL 0.5-2.5 (3) MG/3ML IN SOLN: 3.0000 mL | Freq: Four times a day (QID) | RESPIRATORY_TRACT | Status: DC | PRN

## 2020-08-27 MED ORDER — POTASSIUM CHLORIDE 20 MEQ PO PACK
40.0000 meq | PACK | Freq: Two times a day (BID) | ORAL | Status: AC
  Administered 2020-08-27 (×2): 40 meq via ORAL
  Filled 2020-08-27 (×2): qty 2

## 2020-08-27 MED ORDER — FUROSEMIDE 10 MG/ML IJ SOLN
20.0000 mg/h | INTRAVENOUS | Status: DC
  Administered 2020-08-27: 20 mg/h via INTRAVENOUS
  Filled 2020-08-27: qty 10
  Filled 2020-08-27: qty 25

## 2020-08-27 NOTE — Consult Note (Signed)
Cardiology Consultation:   Patient ID: [name] MRN: [id number]; DOB: 06/07/1932  Admit date: 08/23/2020 Date of Consult: 08/27/2020  Primary Care Provider: [name], MD [facility] Cardiologist: Pixie Casino, MD  [place] Electrophysiologist:  None    Patient Profile:   [name] is a 86 y.o. male with a hx of Chronic diastolic CHF, mixed pulmonary and arterial hypertension followed by pulmonology, CAD with CABG in 1996, CKD, HTN, dyslipidemia, RBBB, OSA on CPAP,  who is being seen today for the evaluation of Afib and CHF at the request of Dr. Evette Doffing.  History of Present Illness:   [name] is followed by Dr. Debara Pickett for the above cardiac issues. Echo in June 2019 showed EF 60% with G2DD. His PA pressure was 90 mmHg. He follows with pulmonology for pulmonary hypertension and help in volume management with lasix. He as a history of CAD with CABG in 1996. NO recent anginal symptoms. The patient was last seen 08/15/20 for hospital follow-up. He was admitted for Acute CHF and AKI. Echo showed LVEF 60-65%, G2DD, with high filling pressures and severe pulmonary HTN with RVSP of 12mmHg. He was diuresed down to 86kg and started on home diuretics. He was noted to be bradycardic into the 40s however asymptomatic. He was discharged with home O2. EKG showed new onset afib with HR of 60. Due to CKD he was started on Eliquis 2.5mg  BID.   The patient presented to the ED 08/23/20 for shortness of breath and worsening heart failure symptoms. He reported orthopnea, LLE, and DOE for the last couple days. Also noted some edema in his groin area which caused him to have difficulty urinating. He was taking lasix 80 mg BID. Also noted his weight had gone up. The Home health nurse had contacted the cardiology office for weight gain. Weight had gone from 192lbs to 210lbs. He was on 4L O2 from the prior hospitalization. Denied chest pain, palpitations, dizziness, lightheadedness, fever, or chills.   In  the ER BP 123/51, pulse 41, afebrile, 94% O2, RR 15. Labs showed creatinine 3.69 (baseline around 2-2.2), BUN 56, potassium 6.0. BNP 453. Hgb 12.7. CXR showed mild improvement of vascular congestion and edema with b/l effusions, L>R. EKG showed afib with rates down to the 40s. The patient was admitted for diuresis and further work-up.    Past Medical History:  Diagnosis Date  . Chronic kidney disease    tumor right kidney  . Coronary artery disease   . Diabetes mellitus    type 2  . Hypertension   . Myocardial infarction ([place])    1997  . Right renal mass     Past Surgical History:  Procedure Laterality Date  . CARDIAC CATHETERIZATION  1996  . CORONARY ARTERY BYPASS GRAFT    . DOPPLER ECHOCARDIOGRAPHY  2011  . HERNIA REPAIR    . IR GENERIC HISTORICAL  12/09/2016   IR RADIOLOGIST EVAL & MGMT 12/09/2016 [name], MD [facility] INTERV RAD  . ROBOTIC ASSITED PARTIAL NEPHRECTOMY Right 02/19/2017   Procedure: XI ROBOTIC ASSITED PARTIAL NEPHRECTOMY;  Surgeon: [name], MD;  Location: [facility];  Service: Urology;  Laterality: Right;     Home Medications:  Prior to Admission medications   Medication Sig Start Date End Date Taking? Authorizing Provider  acetaminophen (TYLENOL) 325 MG tablet Take 650 mg by mouth every 6 (six) hours as needed (for pain.).   Yes [provider]  aspirin EC 81 MG tablet Take 81 mg  by mouth daily. Swallow whole.   Yes [provider]  benazepril (LOTENSIN) 40 MG tablet Take 40 mg by mouth daily. 01/14/17  Yes [provider]  carvedilol (COREG) 6.25 MG tablet Take 1 tablet (6.25 mg total) by mouth 2 (two) times daily with a meal. 08/14/20 09/13/20 Yes Iona Beard, MD  cloNIDine (CATAPRES) 0.1 MG tablet Take 0.1 mg by mouth 2 (two) times daily.   Yes [provider]  ezetimibe (ZETIA) 10 MG tablet Take 10 mg by mouth daily.   Yes [provider]  furosemide (LASIX) 20 MG tablet Take 4 tablets (80 mg total) by mouth 2  (two) times daily. 07/24/20 08/24/20 Yes Amponsah, Charisse March, MD  HUMALOG KWIKPEN 100 UNIT/ML KiwkPen Inject 6-8 Units into the skin 3 (three) times daily.  07/25/15  Yes [provider]  Icosapent Ethyl (VASCEPA) 1 g CAPS Take 2 capsules (2 g total) by mouth 2 (two) times daily. 09/05/19  Yes Hilty, Nadean Corwin, MD  LANTUS SOLOSTAR 100 UNIT/ML Solostar Pen Inject 18 Units into the skin every evening.  02/23/19  Yes [provider]  potassium chloride (KLOR-CON) 10 MEQ tablet Take 2 tablets (20 mEq total) by mouth 2 (two) times daily. 07/24/20 08/24/20 Yes Amponsah, Charisse March, MD  Propylene Glycol (SYSTANE BALANCE) 0.6 % SOLN Place 1 drop into both eyes daily as needed (dry eyes).   Yes [provider]  rosuvastatin (CRESTOR) 20 MG tablet TAKE 1 TABLET BY MOUTH EVERY DAY 08/20/20  Yes Hilty, Nadean Corwin, MD  SYNTHROID 100 MCG tablet Take 100 mcg by mouth daily.  10/17/15  Yes [provider]  apixaban (ELIQUIS) 2.5 MG TABS tablet Take 1 tablet (2.5 mg total) by mouth 2 (two) times daily. 08/15/20   Hilty, Nadean Corwin, MD  nadolol (CORGARD) 40 MG tablet Take 80 mg by mouth daily. Patient not taking: Reported on 08/24/2020 08/21/20   [provider]    Inpatient Medications: Scheduled Meds: . apixaban  2.5 mg Oral BID  . cloNIDine  0.1 mg Oral BID  . ezetimibe  10 mg Oral Daily  . icosapent Ethyl  2 g Oral BID  . insulin aspart  5 Units Subcutaneous TID WC  . ipratropium-albuterol  3 mL Nebulization BID  . levothyroxine  100 mcg Oral Q24H  . metolazone  5 mg Oral Daily  . potassium chloride  40 mEq Oral BID  . rosuvastatin  20 mg Oral Daily  . sodium chloride flush  3 mL Intravenous Q12H   Continuous Infusions: . sodium chloride     PRN Meds: sodium chloride, acetaminophen, albuterol, ondansetron (ZOFRAN) IV, sodium chloride flush  Allergies:   No Known Allergies  Social History:   Social History   Socioeconomic History  . Marital status: Married     Spouse name: Not on file  . Number of children: Not on file  . Years of education: Not on file  . Highest education level: Not on file  Occupational History  . Not on file  Tobacco Use  . Smoking status: Former Smoker    Packs/day: 0.30    Years: 45.00    Pack years: 13.50    Types: Cigarettes    Quit date: 02/15/1997    Years since quitting: 23.5  . Smokeless tobacco: Never Used  Vaping Use  . Vaping Use: Never used  Substance and Sexual Activity  . Alcohol use: No  . Drug use: No  . Sexual activity: Yes  Other Topics Concern  .  Not on file  Social History Narrative  . Not on file   Social Determinants of Health   Financial Resource Strain:   . Difficulty of Paying Living Expenses: Not on file  Food Insecurity:   . Worried About Charity fundraiser in the Last Year: Not on file  . Ran Out of Food in the Last Year: Not on file  Transportation Needs: No Transportation Needs  . Lack of Transportation (Medical): No  . Lack of Transportation (Non-Medical): No  Physical Activity:   . Days of Exercise per Week: Not on file  . Minutes of Exercise per Session: Not on file  Stress:   . Feeling of Stress : Not on file  Social Connections:   . Frequency of Communication with Friends and Family: Not on file  . Frequency of Social Gatherings with Friends and Family: Not on file  . Attends Religious Services: Not on file  . Active Member of Clubs or Organizations: Not on file  . Attends Archivist Meetings: Not on file  . Marital Status: Not on file  Intimate Partner Violence:   . Fear of Current or Ex-Partner: Not on file  . Emotionally Abused: Not on file  . Physically Abused: Not on file  . Sexually Abused: Not on file    Family History:   Family History  Problem Relation Age of Onset  . Diabetes Mother   . Diabetes Sister      ROS:  Please see the history of present illness.  All other ROS reviewed and negative.     Physical Exam/Data:   Vitals:    08/27/20 0345 08/27/20 0859 08/27/20 0935 08/27/20 1151  BP: 132/64  139/68 122/68  Pulse: 67 70 69 61  Resp: 20 18  17   Temp: 98.5 F (36.9 C)   98 F (36.7 C)  TempSrc: Oral     SpO2: 92% 99%  99%  Weight: 87 kg     Height:        Intake/Output Summary (Last 24 hours) at 08/27/2020 1311 Last data filed at 08/27/2020 1048 Gross per 24 hour  Intake 820 ml  Output 3700 ml  Net -2880 ml   Last 3 Weights 08/27/2020 08/26/2020 08/25/2020  Weight (lbs) 191 lb 11.2 oz 199 lb 11.2 oz 205 lb 4 oz  Weight (kg) 86.955 kg 90.583 kg 93.1 kg     Body mass index is 28.31 kg/m.  General:  Well nourished, well developed, in no acute distress HEENT: normal Lymph: no adenopathy Neck: minimal JVD Endocrine:  No thryomegaly Vascular: No carotid bruits; FA pulses 2+ bilaterally without bruits  Cardiac:  normal S1, S2; Reg Irreg; + murmur  Lungs:Diminished at bases Abd: soft, nontender, no hepatomegaly  Ext: 2+ edema Musculoskeletal:  No deformities, BUE and BLE strength normal and equal Skin: warm and dry  Neuro:  CNs 2-12 intact, no focal abnormalities noted Psych:  Normal affect   EKG:  The EKG was personally reviewed and demonstrates:  Afib HR 62, PVCs Telemetry:  Telemetry was personally reviewed and demonstrates:  Afib with possible flutter, HR 60-70s, PVCs  Relevant CV Studies:  Echo 07/20/20 1. Left ventricular ejection fraction, by estimation, is 60 to 65%. The  left ventricle has normal function. The left ventricle has no regional  wall motion abnormalities. There is moderate concentric left ventricular  hypertrophy. Left ventricular  diastolic parameters are consistent with Grade II diastolic dysfunction  (pseudonormalization). Elevated left atrial pressure. There is the  interventricular septum is flattened in systole and diastole, consistent  with right ventricular pressure and volume  overload.  2. Right ventricular systolic function is moderately reduced. The right    ventricular size is moderately enlarged. There is severely elevated  pulmonary artery systolic pressure. The estimated right ventricular  systolic pressure is 19.6 mmHg.  3. Right atrial size was mildly dilated.  4. The mitral valve is grossly normal. Mild mitral valve regurgitation.  No evidence of mitral stenosis.  5. Tricuspid valve regurgitation is mild to moderate.  6. The aortic valve is tricuspid. Aortic valve regurgitation is not  visualized. Mild aortic valve sclerosis is present, with no evidence of  aortic valve stenosis.  7. The inferior vena cava is dilated in size with >50% respiratory  variability, suggesting right atrial pressure of 8 mmHg.   Comparison(s): Changes from prior study are noted. RVSP improved from  prior study.   Laboratory Data:  High Sensitivity Troponin:   Recent Labs  Lab 08/08/20 1944 08/08/20 2200  TROPONINIHS 40* 44*     Chemistry Recent Labs  Lab 08/25/20 1310 08/26/20 0623 08/27/20 0445  NA 140 139 141  K 4.5 3.7 3.0*  CL 104 103 101  CO2 25 25 30   GLUCOSE 146* 177* 215*  BUN 61* 61* 57*  CREATININE 3.37* 3.24* 2.80*  CALCIUM 8.7* 8.7* 8.6*  GFRNONAA 16* 16* 20*  GFRAA 18* 19* 23*  ANIONGAP 11 11 10     Recent Labs  Lab 08/23/20 1331  PROT 6.6  ALBUMIN 3.3*  AST 22  ALT 18  ALKPHOS 109  BILITOT 0.8   Hematology Recent Labs  Lab 08/23/20 1331  WBC 7.3  RBC 4.66  HGB 12.7*  HCT 41.7  MCV 89.5  MCH 27.3  MCHC 30.5  RDW 16.8*  PLT 192   BNP Recent Labs  Lab 08/23/20 1331  BNP 453.4*    DDimer No results for input(s): DDIMER in the last 168 hours.   Radiology/Studies:  DG Chest 2 View  Result Date: 08/23/2020 CLINICAL DATA:  Short of breath.  History of CHF EXAM: CHEST - 2 VIEW COMPARISON:  08/08/2020 FINDINGS: Cardiac enlargement with prior CABG. Pulmonary vascular congestion with mild interval improvement. Mild edema with improvement. Bilateral pleural effusions left greater than right unchanged.  Bibasilar atelectasis left greater than right unchanged. IMPRESSION: Mild improvement in vascular congestion and edema. Findings compatible with heart failure with bilateral effusions left greater than right. Electronically Signed   By: Franchot Gallo M.D.   On: 08/23/2020 13:57   NM CARDIAC AMYLOID TUMOR LOC INFLAM SPECT 1 DAY  Result Date: 08/26/2020 CLINICAL DATA:  HEART FAILURE. CONCERN FOR CARDIAC AMYLOIDOSIS. EXAM: NUCLEAR MEDICINE TUMOR LOCALIZATION. PYP CARDIAC AMYLOIDOSIS SCAN WITH SPECT TECHNIQUE: Following intravenous administration of radiopharmaceutical, anterior planar images of the chest were obtained. Regions of interest were placed on the heart and contralateral chest wall for quantitative assessment. Additional SPECT imaging of the chest was obtained. RADIOPHARMACEUTICALS:  19.5 mCi TECHNETIUM 99 PYROPHOSPHATE FINDINGS: Planar Visual assessment: Anterior planar imaging demonstrates radiotracer uptake within the heart less than than uptake within the adjacent ribs (Grade 0). Quantitative assessment : Quantitative assessment of the cardiac uptake compared to the contralateral chest wall is equal to 0.89 SPECT assessment: SPECT imaging of the chest demonstrates no appreciable radiotracer accumulation within the LEFT ventricle. IMPRESSION: Visual and quantitative assessment (grade 0, H/CLL equal 0.89) are not suggestive of transthyretin amyloidosis. Electronically Signed   By: Kerby Moors M.D.   On: 08/26/2020  18:10   US RENAL  Result Date: 08/23/2020 CLINICAL DATA:  Acute kidney injury EXAM: RENAL / URINARY TRACT ULTRASOUND COMPLETE COMPARISON:  None. FINDINGS: Right Kidney: Renal measurements: 10.3 x 4.9 x 6.0 cm = volume: 157 mL. Echogenicity within normal limits. Cortical scarring seen in the upper pole which could be from prior partial nephrectomy. No mass or hydronephrosis visualized. Left Kidney: Renal measurements: 8.9 x 4.9 x 5 4 cm = volume: 121 mL. Echogenicity within normal  limits. Anechoic cysts are seen within the left kidney the largest measuring 2.6 x 1.9 x 2.2 cm. Bladder: Appears normal for degree of bladder distention. Other: A small right pleural effusion is present. There is a small amount of perihepatic ascites present. IMPRESSION: Cortical scarring in the upper pole the right kidney, likely from partial nephrectomy. Left renal cysts Small right pleural effusion and small amount of perihepatic ascites. Electronically Signed   By: Prudencio Pair M.D.   On: 08/23/2020 21:26     Assessment and Plan:   Acute on chronic diastolic CHF - Presented with weight gain, orthopnea. BNP up to 400 and CXR wth acute CHF started on IV lasix - pta patient was on lasix 80mg  BID - IV lasix 20mg  hr and metolazone 5mg  daily. This  - Overall he has put out -5.3L and weight is down 207lbs>191lbs - Patient is overall feeling better but is still volume overloaded on exam. Would continue with more diuresis  Afib - rate controlled, however had some bradycardia and BB held - CHADSVASC = 5 - Eliquis 2.5mg  BID for renal insufficiency - Heart rates now 60-70s.  - Patient is asymptomatic - Will discuss TEE/DCCV with MD  AKI on CKD stage 3 - creatinine peak 3.71, now down trending, today 2.8 - nephrology consulted - ACE held  HTN - ACE held for AKI - clonidine 0.1mg  BID - pressures reasonable  OSA on CPAP - continue CPAP  CAD s/p CABG 1996 - Denies chest pain  For questions or updates, please contact [facility] Please consult [email or web address] for contact info under    Signed, Roshanda Balazs Ninfa Meeker, PA-C  08/27/2020 1:11 PM

## 2020-08-27 NOTE — Progress Notes (Signed)
Patient ID: [name], male   DOB: 1931/12/27, 86 y.o.   MRN: [id number] S: feels well O:BP 122/68 (BP Location: Right Arm)    Pulse 61    Temp 98 F (36.7 C)    Resp 17    Ht 5\' 9"  (1.753 m)    Wt 87 kg    SpO2 99%    BMI 28.31 kg/m   Intake/Output Summary (Last 24 hours) at 08/27/2020 1600 Last data filed at 08/27/2020 1406 Gross per 24 hour  Intake 1060 ml  Output 3500 ml  Net -2440 ml   Intake/Output: I/O last 3 completed shifts: In: 63 [P.O.:1580; I.V.:160] Out: 6180 [Urine:6180]  Intake/Output this shift:  Total I/O In: 460 [P.O.:460] Out: 450 [Urine:450] Weight change: -3.629 kg Gen: NAD CVS: no rub Resp: cta Abd: +BS,. Soft, NT/ND Ext: 1+ pretibial edema  Recent Labs  Lab 08/23/20 1331 08/23/20 2105 08/24/20 1611 08/24/20 2045 08/25/20 0054 08/25/20 1310 08/26/20 0623 08/27/20 0445 08/27/20 1407  NA 140   < > 141 141 139 140 139 141 143  K 6.0*   < > 5.8* 5.3* 4.8 4.5 3.7 3.0* 3.7  CL 109   < > 109 107 106 104 103 101 99  CO2 23   < > 22 24 24 25 25 30  33*  GLUCOSE 85   < > 96 118* 154* 146* 177* 215* 132*  BUN 56*   < > 61* 60* 60* 61* 61* 57* 55*  CREATININE 3.69*   < > 3.43* 3.51* 3.30* 3.37* 3.24* 2.80* 2.69*  ALBUMIN 3.3*  --   --   --   --   --   --   --   --   CALCIUM 8.9   < > 9.2 9.0 8.7* 8.7* 8.7* 8.6* 8.9  AST 22  --   --   --   --   --   --   --   --   ALT 18  --   --   --   --   --   --   --   --    < > = values in this interval not displayed.   Liver Function Tests: Recent Labs  Lab 08/23/20 1331  AST 22  ALT 18  ALKPHOS 109  BILITOT 0.8  PROT 6.6  ALBUMIN 3.3*   No results for input(s): LIPASE, AMYLASE in the last 168 hours. No results for input(s): AMMONIA in the last 168 hours. CBC: Recent Labs  Lab 08/23/20 1331  WBC 7.3  NEUTROABS 5.9  HGB 12.7*  HCT 41.7  MCV 89.5  PLT 192   Cardiac Enzymes: No results for input(s): CKTOTAL, CKMB, CKMBINDEX, TROPONINI in the last 168 hours. CBG: Recent Labs  Lab  08/26/20 2012 08/26/20 2110 08/27/20 0613 08/27/20 0628 08/27/20 1153  GLUCAP 143* 153* 189* 185* 118*    Iron Studies: No results for input(s): IRON, TIBC, TRANSFERRIN, FERRITIN in the last 72 hours. Studies/Results: NM CARDIAC AMYLOID TUMOR LOC INFLAM SPECT 1 DAY  Result Date: 08/26/2020 CLINICAL DATA:  HEART FAILURE. CONCERN FOR CARDIAC AMYLOIDOSIS. EXAM: NUCLEAR MEDICINE TUMOR LOCALIZATION. PYP CARDIAC AMYLOIDOSIS SCAN WITH SPECT TECHNIQUE: Following intravenous administration of radiopharmaceutical, anterior planar images of the chest were obtained. Regions of interest were placed on the heart and contralateral chest wall for quantitative assessment. Additional SPECT imaging of the chest was obtained. RADIOPHARMACEUTICALS:  19.5 mCi TECHNETIUM 99 PYROPHOSPHATE FINDINGS: Planar Visual assessment: Anterior planar imaging demonstrates radiotracer uptake within the heart  less than than uptake within the adjacent ribs (Grade 0). Quantitative assessment : Quantitative assessment of the cardiac uptake compared to the contralateral chest wall is equal to 0.89 SPECT assessment: SPECT imaging of the chest demonstrates no appreciable radiotracer accumulation within the LEFT ventricle. IMPRESSION: Visual and quantitative assessment (grade 0, H/CLL equal 0.89) are not suggestive of transthyretin amyloidosis. Electronically Signed   By: Kerby Moors M.D.   On: 08/26/2020 18:10    apixaban  2.5 mg Oral BID   cloNIDine  0.1 mg Oral BID   ezetimibe  10 mg Oral Daily   icosapent Ethyl  2 g Oral BID   insulin aspart  5 Units Subcutaneous TID WC   ipratropium-albuterol  3 mL Nebulization BID   levothyroxine  100 mcg Oral Q24H   metolazone  5 mg Oral Daily   potassium chloride  40 mEq Oral BID   rosuvastatin  20 mg Oral Daily   sodium chloride flush  3 mL Intravenous Q12H    BMET    Component Value Date/Time   NA 143 08/27/2020 1407   NA 142 06/02/2019 1644   K 3.7 08/27/2020 1407   CL  99 08/27/2020 1407   CO2 33 (H) 08/27/2020 1407   GLUCOSE 132 (H) 08/27/2020 1407   BUN 55 (H) 08/27/2020 1407   BUN 18 06/02/2019 1644   CREATININE 2.69 (H) 08/27/2020 1407   CREATININE 1.54 (H) 03/31/2017 1028   CALCIUM 8.9 08/27/2020 1407   GFRNONAA 21 (L) 08/27/2020 1407   GFRAA 24 (L) 08/27/2020 1407   CBC    Component Value Date/Time   WBC 7.3 08/23/2020 1331   RBC 4.66 08/23/2020 1331   HGB 12.7 (L) 08/23/2020 1331   HCT 41.7 08/23/2020 1331   PLT 192 08/23/2020 1331   MCV 89.5 08/23/2020 1331   MCH 27.3 08/23/2020 1331   MCHC 30.5 08/23/2020 1331   RDW 16.8 (H) 08/23/2020 1331   LYMPHSABS 0.6 (L) 08/23/2020 1331   MONOABS 0.6 08/23/2020 1331   EOSABS 0.1 08/23/2020 1331   BASOSABS 0.0 08/23/2020 1331    TMH:DQQIWL Joseph Wadeis an 86 y.o.malewith HTN, DM, CKD 3, CHF, CAD CABG '96, recent Joseph fib, CHF (normal EF, grade 2 DD, mod dec RV function - 07/2020) who is seen for evaluation and management of AKI on CKD.   Assessment/Plan:  1. AKI/CKD stage  (baseline Scr 1.7-2) in setting of decompensated CHF consistent cardiorenal syndrome with concomitant ACE inhibition.  Continues to diurese with steady improvement of BUN/CR. 1. Continue to hold ACE and continue to follow UOP and Scr 2. No indication for dialysis at this time.  Continue with medical management. 3. He will need to establish outpatient follow up with our office after discharge. 2. Acute on chronic combined systolic and diastolic CHF- with decreased RV function and DD.  Didn't initially respond to lasix drip but doing better with larger dose of 20 mg/hr and metolazone 5 mg daily.  Received 2 doses of IV lasix 120 on 9/19 and 9/20.  Continue with sodium and fluid restriction.  3. Hyperkalemia- resolved off ACE and lokelma and now low so will stop lokelma and replete as needed.  4. Joseph fib on eliquis but no beta-blocker due to bradycardia.  For possible cardioversion pending Cardiology eval.  5. OSA on CPAP 6. HTN-  stable 7. COPD- per primary Donetta Potts, MD [facility] [phone]

## 2020-08-27 NOTE — Progress Notes (Signed)
Occupational Therapy Treatment Patient Details Name: [name] MRN: [id number] DOB: Aug 09, 1932 Today's Date: 08/27/2020    History of present illness [name] is a 86 year old male with a PMHx significant for HTN, HLD, COPD, OSA, CKD 3b, recent diagnosis of afib (on Eliquis 2.5 mg), CAD s/p CABG, and HFpEF who presents for worsening shortness of breath and leg swelling. Patient states that after discharge, he was 'feeling good.' He states earlier last weekend he went to a funeral where he had some catering with baked chicken, rice and beans. After the weekend, he began to have worsening leg swelling, shortness of breath and weakness. He mentions that he also noted increased swelling in his groin and his penis which caused him to have difficulty urinating   OT comments  Patient requesting sponge bath this date.  Patient with good safety, moving in room without an assistive device.  Stood for grooming, UB bathing and peri care.  Able to wash tops of his legs in stand.  Lower legs wrapped.  Patient was assisted with UB dressing due to hospital gown, but patient sat to don fresh pair of underwear.  Patient did de-sat to 88%, cueing to purse lip breath and take a rest.  Complained of 2/4 dyspnea.  Rebounded quickly to 98%.  Recommend HH follow up at home and continue to follow in the acute setting to maximize functional status.   Follow Up Recommendations  Home health OT;Supervision/Assistance - 24 hour    Equipment Recommendations  Tub/shower seat;3 in 1 bedside commode    Recommendations for Other Services      Precautions / Restrictions Precautions Precautions: Fall Restrictions Weight Bearing Restrictions: No       Mobility Bed Mobility                  Transfers     Transfers: Sit to/from Stand Sit to Stand: Supervision Stand pivot transfers: Supervision            Balance             Standing balance-Leahy Scale: Good                              ADL either performed or assessed with clinical judgement   ADL Overall ADL's : Needs assistance/impaired     Grooming: Oral care;Wash/dry face;Wash/dry hands;Applying deodorant;Brushing hair;Set up;Standing   Upper Body Bathing: Supervision/ safety;Standing                   Toileting- Clothing Manipulation and Hygiene: Supervision/safety;Sit to/from stand               Vision       Perception     Praxis      Cognition Arousal/Alertness: Awake/alert Behavior During Therapy: WFL for tasks assessed/performed Overall Cognitive Status: Within Functional Limits for tasks assessed                                                      General Comments      Pertinent Vitals/ Pain       Pain Assessment: No/denies pain  Frequency  Min 2X/week        Progress Toward Goals  OT Goals(current goals can now be found in the care plan section)  Progress towards OT goals: Progressing toward goals  Acute Rehab OT Goals Patient Stated Goal: Get to where I can do for myself OT Goal Formulation: With patient Time For Goal Achievement: 09/09/20  Plan Discharge plan remains appropriate    Co-evaluation                 AM-PAC OT "6 Clicks" Daily Activity     Outcome Measure   Help from another person eating meals?: None   Help from another person toileting, which includes using toliet, bedpan, or urinal?: A Little Help from another person bathing (including washing, rinsing, drying)?: A Little Help from another person to put on and taking off regular upper body clothing?: A Little Help from another person to put on and taking off regular lower body clothing?: A Lot 6 Click Score: 15    End of Session Equipment Utilized During Treatment: Oxygen  OT Visit Diagnosis: Unsteadiness on feet (R26.81);Other abnormalities of gait and mobility  (R26.89);Muscle weakness (generalized) (M62.81)   Activity Tolerance Patient tolerated treatment well   Patient Left in chair;with call bell/phone within reach   Nurse Communication          Time: 5427-0623 OT Time Calculation (min): 28 min  Charges: OT General Charges $OT Visit: 1 Visit OT Treatments $Self Care/Home Management : 23-37 mins  08/27/2020  Rich, OTR/L  Acute Rehabilitation Services  Office:  [phone]    Metta Clines 08/27/2020, 4:10 PM

## 2020-08-27 NOTE — Plan of Care (Signed)
  Problem: Clinical Measurements: Goal: Ability to maintain clinical measurements within normal limits will improve Outcome: Progressing   Problem: Clinical Measurements: Goal: Will remain free from infection Outcome: Progressing   

## 2020-08-27 NOTE — Progress Notes (Signed)
Subjective: HD 3 Overnight, no acute events reported.  [name] was evaluated at bedside this morning. He is in good spirits. He endorses that his breathing is improved and he was able to tolerate getting out of bed to chair yesterday. He notes increased urination since yesterday. Discussed need for continued diuresis and consulting cardiology for possible cardioversion during this admission. He expresses understanding.   Objective:  Vital signs in last 24 hours: Vitals:   08/26/20 1112 08/26/20 2020 08/26/20 2043 08/27/20 0345  BP: (!) 107/49 139/65  132/64  Pulse: 62 70 71 67  Resp: 17 18 19 20   Temp: 97.9 F (36.6 C) 98.1 F (36.7 C)  98.5 F (36.9 C)  TempSrc:  Oral  Oral  SpO2: 98% 94% 94% 92%  Weight:    87 kg  Height:       CBC Latest Ref Rng & Units 08/23/2020 08/16/2020 08/14/2020  WBC 4.0 - 10.5 K/uL 7.3 6.1 7.1  Hemoglobin 13.0 - 17.0 g/dL 12.7(L) 13.1 14.2  Hematocrit 39 - 52 % 41.7 41.3 44.8  Platelets 150 - 400 K/uL 192 152 191   BMP Latest Ref Rng & Units 08/27/2020 08/26/2020 08/25/2020  Glucose 70 - 99 mg/dL 215(H) 177(H) 146(H)  BUN 8 - 23 mg/dL 57(H) 61(H) 61(H)  Creatinine 0.61 - 1.24 mg/dL 2.80(H) 3.24(H) 3.37(H)  BUN/Creat Ratio 10 - 24 - - -  Sodium 135 - 145 mmol/L 141 139 140  Potassium 3.5 - 5.1 mmol/L 3.0(L) 3.7 4.5  Chloride 98 - 111 mmol/L 101 103 104  CO2 22 - 32 mmol/L 30 25 25   Calcium 8.9 - 10.3 mg/dL 8.6(L) 8.7(L) 8.7(L)   I/O last 3 completed shifts: In: 2060 [P.O.:2060] Out: 4870 [Urine:4870] Total I/O In: 380 [P.O.:220; I.V.:160] Out: 2400 [Urine:2400]  Net IO Since Admission: -5,310 mL [08/27/20 0638]  Physical Exam  Constitutional: Appears well-developed and well-nourished. No distress.  Cardiovascular: Normal rate, regular rhythm, S1 and S2 present, no murmurs, rubs, gallops.  Distal pulses intact; minimal JVD appreciated Respiratory: No respiratory distress, no accessory muscle use.  Effort is normal.  Lungs are clear to  auscultation bilaterally. GI: Nondistended, soft, nontender to palpation, normal active bowel sounds Musculoskeletal: Normal bulk and tone.  2+ pitting edema and dependent edema to bilateral thighs  Neurological: Is alert and oriented x4, no apparent focal deficits noted. Skin: Warm and dry.  No rash, erythema, lesions noted.  Assessment/Plan:  Principal Problem:   Acute on chronic right heart failure (HCC) Active Problems:   Acute on chronic renal failure (HCC)   OSA (obstructive sleep apnea)   Diabetes [facility]) [name] is a 86 year old male with PMHx of hypertension, hyperlipidemia, right-sided diastolic heart failure, obstructive sleep apnea, COPD, CAD s/p CABG, chronic kidney disease, and recent diagnosis of atrial fibrillation on Eliquis admitted for acute heart failure exacerbation and worsening renal function.  Acute diastolic heart failure exacerbation Patient presented with dyspnea in setting of acute on chronic right heart failure exacerbation. He has been diuresing with lasix and metolazone. Overnight, had 4.2L UOP and is net -5.3L since admission and down 7kg. Noted to have dry weight of 86kg on discharge from last admission. Currently is 87kg; does have significant bilateral lower extremity pitting edema but improved JVD and respiratory status.  PYP study to assess for transthyretin amyloidosis was negative.  - Continue diuresis at IV Lasix 20mg /hr and metolazone 5mg  daily - Monitor BMP q12h for electrolyte replacement - Strict I&O and daily  weights - F/u Serum IFE and light chains   Acute on chronic renal failure  Patient with increased urine output and improving serum creatinine with diuresis. sCr down to 2.8 this morning.  - Nephrology consulted, appreciate their recommendations - Continuing diuresis with lasix and metolazone - Continuing to hold ACEi - Continue to monitor renal function and avoid nephrotoxic agents as able   Atrial fibrillation  CHADS2-VASc score  5. Patient noted to have new onset atrial fibrillation at cardiology visit on 9/9 for which he has been started on anticoagulation. Volume status is improved since admission. Consider possible TEE with cardioversion  - Cardiology consulted, appreciate recommendations regarding possible cardioversion during this admission  - Continue Eliquis 2.5mg  bid - Continue cardiac monitoring  - Holding beta blocker in setting of bradycardia - Goal K>4, Mg >2  Hypertension Currently holding ACE in setting of AKI. Well controlled on clonidine - Continue clonidine 0.1mg  bid   Obstructive sleep apnea: - Continue CPAP at night   COPD:  - Continue duonebs and albuterol prn  Diet: Renal/Carb modified Fluids/Electrolytes: Monitor and replete prn DVT Prophylaxis: Eliquis Code status:  FULL   Prior to Admission Living Arrangement: Home Anticipated Discharge Location: Home w/HH Barriers to Discharge: Continued medical management Dispo: Anticipated discharge in approximately 1-2 day(s).   Harvie Heck, MD  [facility] PGY-2 08/27/2020, 6:38 AM Pager: [phone] After 5pm on weekdays and 1pm on weekends: On Call pager [phone]

## 2020-08-28 DIAGNOSIS — J439 Emphysema, unspecified: Secondary | ICD-10-CM | POA: Diagnosis not present

## 2020-08-28 DIAGNOSIS — N1831 Chronic kidney disease, stage 3a: Secondary | ICD-10-CM

## 2020-08-28 DIAGNOSIS — M25562 Pain in left knee: Secondary | ICD-10-CM

## 2020-08-28 DIAGNOSIS — N170 Acute kidney failure with tubular necrosis: Secondary | ICD-10-CM

## 2020-08-28 DIAGNOSIS — L97819 Non-pressure chronic ulcer of other part of right lower leg with unspecified severity: Secondary | ICD-10-CM | POA: Diagnosis not present

## 2020-08-28 DIAGNOSIS — Z4800 Encounter for change or removal of nonsurgical wound dressing: Secondary | ICD-10-CM | POA: Diagnosis not present

## 2020-08-28 DIAGNOSIS — J9611 Chronic respiratory failure with hypoxia: Secondary | ICD-10-CM | POA: Diagnosis not present

## 2020-08-28 LAB — IMMUNOFIXATION ELECTROPHORESIS
IgA: 343 mg/dL (ref 61–437)
IgG (Immunoglobin G), Serum: 1058 mg/dL (ref 603–1613)
IgM (Immunoglobulin M), Srm: 12 mg/dL — ABNORMAL LOW (ref 15–143)
Total Protein ELP: 6.1 g/dL (ref 6.0–8.5)

## 2020-08-28 LAB — CBC
HCT: 38.4 % — ABNORMAL LOW (ref 39.0–52.0)
Hemoglobin: 12 g/dL — ABNORMAL LOW (ref 13.0–17.0)
MCH: 26.8 pg (ref 26.0–34.0)
MCHC: 31.3 g/dL (ref 30.0–36.0)
MCV: 85.9 fL (ref 80.0–100.0)
Platelets: 166 10*3/uL (ref 150–400)
RBC: 4.47 MIL/uL (ref 4.22–5.81)
RDW: 15.4 % (ref 11.5–15.5)
WBC: 7 10*3/uL (ref 4.0–10.5)
nRBC: 0 % (ref 0.0–0.2)

## 2020-08-28 LAB — RENAL FUNCTION PANEL
Albumin: 3.1 g/dL — ABNORMAL LOW (ref 3.5–5.0)
Anion gap: 13 (ref 5–15)
BUN: 51 mg/dL — ABNORMAL HIGH (ref 8–23)
CO2: 33 mmol/L — ABNORMAL HIGH (ref 22–32)
Calcium: 9 mg/dL (ref 8.9–10.3)
Chloride: 93 mmol/L — ABNORMAL LOW (ref 98–111)
Creatinine, Ser: 2.43 mg/dL — ABNORMAL HIGH (ref 0.61–1.24)
GFR calc Af Amer: 27 mL/min — ABNORMAL LOW (ref >60)
GFR calc non Af Amer: 23 mL/min — ABNORMAL LOW (ref >60)
Glucose, Bld: 133 mg/dL — ABNORMAL HIGH (ref 70–99)
Phosphorus: 3.5 mg/dL (ref 2.5–4.6)
Potassium: 3.3 mmol/L — ABNORMAL LOW (ref 3.5–5.1)
Sodium: 139 mmol/L (ref 135–145)

## 2020-08-28 LAB — GLUCOSE, CAPILLARY
Glucose-Capillary: 135 mg/dL — ABNORMAL HIGH (ref 70–99)
Glucose-Capillary: 338 mg/dL — ABNORMAL HIGH (ref 70–99)
Glucose-Capillary: 106 mg/dL — ABNORMAL HIGH (ref 70–99)
Glucose-Capillary: 172 mg/dL — ABNORMAL HIGH (ref 70–99)

## 2020-08-28 LAB — MAGNESIUM: Magnesium: 2 mg/dL (ref 1.7–2.4)

## 2020-08-28 MED ORDER — POTASSIUM CHLORIDE CRYS ER 20 MEQ PO TBCR
40.0000 meq | EXTENDED_RELEASE_TABLET | Freq: Two times a day (BID) | ORAL | Status: AC
  Administered 2020-08-28 (×2): 40 meq via ORAL
  Filled 2020-08-28 (×2): qty 2

## 2020-08-28 MED ORDER — FUROSEMIDE 80 MG PO TABS
80.0000 mg | ORAL_TABLET | Freq: Two times a day (BID) | ORAL | Status: DC
  Administered 2020-08-28 – 2020-08-29 (×2): 80 mg via ORAL
  Filled 2020-08-28 (×2): qty 1

## 2020-08-28 NOTE — Progress Notes (Signed)
Patient ID: [name], male   DOB: 1931-12-25, 86 y.o.   MRN: [id number] S: Feels well today O:BP (!) 109/53 (BP Location: Right Arm)   Pulse 65   Temp 98.4 F (36.9 C) (Oral)   Resp 18   Ht 5\' 9"  (1.753 m)   Wt 81.2 kg   SpO2 98%   BMI 26.43 kg/m   Intake/Output Summary (Last 24 hours) at 08/28/2020 1258 Last data filed at 08/28/2020 0900 Gross per 24 hour  Intake 726.99 ml  Output 3260 ml  Net -2533.01 ml   Intake/Output: I/O last 3 completed shifts: In: 1087 [P.O.:680; I.V.:407] Out: 6550 [Urine:6550]  Intake/Output this shift:  Total I/O In: 240 [P.O.:240] Out: -  Weight change: -5.761 kg Gen: nad CVS: no rub Resp: cta Abd: benign Ext: trace pretibial edema  Recent Labs  Lab 08/23/20 1331 08/23/20 2105 08/24/20 2045 08/25/20 0054 08/25/20 1310 08/26/20 0623 08/27/20 0445 08/27/20 1407 08/28/20 0800  NA 140   < > 141 139 140 139 141 143 139  K 6.0*   < > 5.3* 4.8 4.5 3.7 3.0* 3.7 3.3*  CL 109   < > 107 106 104 103 101 99 93*  CO2 23   < > 24 24 25 25 30  33* 33*  GLUCOSE 85   < > 118* 154* 146* 177* 215* 132* 133*  BUN 56*   < > 60* 60* 61* 61* 57* 55* 51*  CREATININE 3.69*   < > 3.51* 3.30* 3.37* 3.24* 2.80* 2.69* 2.43*  ALBUMIN 3.3*  --   --   --   --   --   --   --  3.1*  CALCIUM 8.9   < > 9.0 8.7* 8.7* 8.7* 8.6* 8.9 9.0  PHOS  --   --   --   --   --   --   --   --  3.5  AST 22  --   --   --   --   --   --   --   --   ALT 18  --   --   --   --   --   --   --   --    < > = values in this interval not displayed.   Liver Function Tests: Recent Labs  Lab 08/23/20 1331 08/28/20 0800  AST 22  --   ALT 18  --   ALKPHOS 109  --   BILITOT 0.8  --   PROT 6.6  --   ALBUMIN 3.3* 3.1*   No results for input(s): LIPASE, AMYLASE in the last 168 hours. No results for input(s): AMMONIA in the last 168 hours. CBC: Recent Labs  Lab 08/23/20 1331 08/28/20 1131  WBC 7.3 7.0  NEUTROABS 5.9  --   HGB 12.7* 12.0*  HCT 41.7 38.4*  MCV 89.5 85.9  PLT 192  166   Cardiac Enzymes: No results for input(s): CKTOTAL, CKMB, CKMBINDEX, TROPONINI in the last 168 hours. CBG: Recent Labs  Lab 08/27/20 0628 08/27/20 1153 08/27/20 1614 08/27/20 2113 08/28/20 0621  GLUCAP 185* 118* 80 146* 135*    Iron Studies: No results for input(s): IRON, TIBC, TRANSFERRIN, FERRITIN in the last 72 hours. Studies/Results: NM CARDIAC AMYLOID TUMOR LOC INFLAM SPECT 1 DAY  Result Date: 08/26/2020 CLINICAL DATA:  HEART FAILURE. CONCERN FOR CARDIAC AMYLOIDOSIS. EXAM: NUCLEAR MEDICINE TUMOR LOCALIZATION. PYP CARDIAC AMYLOIDOSIS SCAN WITH SPECT TECHNIQUE: Following intravenous administration of radiopharmaceutical, anterior planar images of  the chest were obtained. Regions of interest were placed on the heart and contralateral chest wall for quantitative assessment. Additional SPECT imaging of the chest was obtained. RADIOPHARMACEUTICALS:  19.5 mCi TECHNETIUM 99 PYROPHOSPHATE FINDINGS: Planar Visual assessment: Anterior planar imaging demonstrates radiotracer uptake within the heart less than than uptake within the adjacent ribs (Grade 0). Quantitative assessment : Quantitative assessment of the cardiac uptake compared to the contralateral chest wall is equal to 0.89 SPECT assessment: SPECT imaging of the chest demonstrates no appreciable radiotracer accumulation within the LEFT ventricle. IMPRESSION: Visual and quantitative assessment (grade 0, H/CLL equal 0.89) are not suggestive of transthyretin amyloidosis. Electronically Signed   By: Kerby Moors M.D.   On: 08/26/2020 18:10   . apixaban  2.5 mg Oral BID  . cloNIDine  0.1 mg Oral BID  . ezetimibe  10 mg Oral Daily  . icosapent Ethyl  2 g Oral BID  . insulin aspart  5 Units Subcutaneous TID WC  . levothyroxine  100 mcg Oral Q24H  . potassium chloride  40 mEq Oral BID  . rosuvastatin  20 mg Oral Daily  . sodium chloride flush  3 mL Intravenous Q12H    BMET    Component Value Date/Time   NA 139 08/28/2020 0800    NA 142 06/02/2019 1644   K 3.3 (L) 08/28/2020 0800   CL 93 (L) 08/28/2020 0800   CO2 33 (H) 08/28/2020 0800   GLUCOSE 133 (H) 08/28/2020 0800   BUN 51 (H) 08/28/2020 0800   BUN 18 06/02/2019 1644   CREATININE 2.43 (H) 08/28/2020 0800   CREATININE 1.54 (H) 03/31/2017 1028   CALCIUM 9.0 08/28/2020 0800   GFRNONAA 23 (L) 08/28/2020 0800   GFRAA 27 (L) 08/28/2020 0800   CBC    Component Value Date/Time   WBC 7.0 08/28/2020 1131   RBC 4.47 08/28/2020 1131   HGB 12.0 (L) 08/28/2020 1131   HCT 38.4 (L) 08/28/2020 1131   PLT 166 08/28/2020 1131   MCV 85.9 08/28/2020 1131   MCH 26.8 08/28/2020 1131   MCHC 31.3 08/28/2020 1131   RDW 15.4 08/28/2020 1131   LYMPHSABS 0.6 (L) 08/23/2020 1331   MONOABS 0.6 08/23/2020 1331   EOSABS 0.1 08/23/2020 1331   BASOSABS 0.0 08/23/2020 1331     LDJ:TTSVXB Joseph Wadeis an 86 y.o.malewith HTN, DM, CKD 3, CHF, CAD CABG '96, recent Joseph fib, CHF (normal EF, grade 2 DD, mod dec RV function - 07/2020) who is seen for evaluation and management of AKI on CKD.  Assessment/Plan:  1. AKI/CKD stage (baseline Scr 1.7-2) in setting of decompensated CHF consistent cardiorenal syndrome with concomitant ACE inhibition. Continues to diurese with steady improvement of BUN/CR. 1. Continue to hold ACE and continue to follow UOP and Scr 2. No indication for dialysis at this time. Continue with medical management. 3. He will need to establish outpatient follow up with our office after discharge. 2. Acute on chronic combined systolic and diastolic CHF- with decreased RV function and DD. Didn't initially respond to lasix drip but doing better with larger dose of 20 mg/hr and metolazone 5 mg daily. Received 2 doses of IV lasix 120 on 9/19 and 9/20.  1. Stop lasix drip now that his volume status has markedly improved 2. Change to po lasix 80 mg bid and follow UOP and Scr 3. Continue with sodium and fluid restriction.  3. Hyperkalemia- resolved off ACE and lokelma and  now low so will stop lokelma and replete as needed.  4. Joseph fib on eliquis but no beta-blocker due to bradycardia. For possible cardioversion tomorrow per Cardiology eval.  5. OSA on CPAP 6. HTN- stable 7. COPD- per primary  Donetta Potts, MD [facility] [phone]

## 2020-08-28 NOTE — Progress Notes (Addendum)
DAILY PROGRESS NOTE   Patient Name: [name] Date of Encounter: 08/28/2020 Cardiologist: Pixie Casino, MD  Chief Complaint   No complaints  Patient Profile   [name] is a 86 y.o. male with a hx of Chronic diastolic CHF, mixed pulmonary and arterial hypertension followed by pulmonology, CAD with CABG in 1996, CKD, HTN, dyslipidemia, RBBB, OSA on CPAP,  who is being seen today for the evaluation of Afib and CHF at the request of Dr. Evette Doffing.  Subjective   Another good day of diuresis - down 3.4L (8L total) - creatinine further improved to 2.43 (from 2.69). weight down to 81 kg. Mild hypokalemia today at 3.3, magnesium 2.0.  Objective   Vitals:   08/27/20 1929 08/27/20 2014 08/28/20 0013 08/28/20 0613  BP: 130/72   127/72  Pulse: 69  71 70  Resp: 20  18 20   Temp: 99.1 F (37.3 C)   98.3 F (36.8 C)  TempSrc: Oral   Oral  SpO2: 97% 94% 92% 93%  Weight:    81.2 kg  Height:        Intake/Output Summary (Last 24 hours) at 08/28/2020 2440 Last data filed at 08/28/2020 1027 Gross per 24 hour  Intake 706.99 ml  Output 3700 ml  Net -2993.01 ml   Filed Weights   08/26/20 0458 08/27/20 0345 08/28/20 2536  Weight: 90.6 kg 87 kg 81.2 kg    Physical Exam   General appearance: alert and no distress Neck: no carotid bruit, no JVD and thyroid not enlarged, symmetric, no tenderness/mass/nodules Lungs: diminished breath sounds bibasilar Heart: irregularly irregular rhythm Abdomen: soft, non-tender; bowel sounds normal; no masses,  no organomegaly Extremities: edema trace to 1+ bilateral edema Pulses: 2+ and symmetric Skin: Skin color, texture, turgor normal. No rashes or lesions Neurologic: Mental status: Alert, oriented, thought content appropriate Psych: Pleasant  Inpatient Medications    Scheduled Meds: . apixaban  2.5 mg Oral BID  . cloNIDine  0.1 mg Oral BID  . ezetimibe  10 mg Oral Daily  . icosapent Ethyl  2 g Oral BID  . insulin aspart  5 Units  Subcutaneous TID WC  . levothyroxine  100 mcg Oral Q24H  . rosuvastatin  20 mg Oral Daily  . sodium chloride flush  3 mL Intravenous Q12H    Continuous Infusions: . sodium chloride    . furosemide (LASIX) infusion 20 mg/hr (08/27/20 2311)    PRN Meds: sodium chloride, acetaminophen, albuterol, ipratropium-albuterol, ondansetron (ZOFRAN) IV, sodium chloride flush   Labs   Results for orders placed or performed during the hospital encounter of 08/23/20 (from the past 48 hour(s))  Glucose, capillary     Status: Abnormal   Collection Time: 08/26/20 11:13 AM  Result Value Ref Range   Glucose-Capillary 195 (H) 70 - 99 mg/dL    Comment: Glucose reference range applies only to samples taken after fasting for at least 8 hours.  Glucose, capillary     Status: Abnormal   Collection Time: 08/26/20  4:13 PM  Result Value Ref Range   Glucose-Capillary 149 (H) 70 - 99 mg/dL    Comment: Glucose reference range applies only to samples taken after fasting for at least 8 hours.  Glucose, capillary     Status: Abnormal   Collection Time: 08/26/20  8:12 PM  Result Value Ref Range   Glucose-Capillary 143 (H) 70 - 99 mg/dL    Comment: Glucose reference range applies only to samples taken after fasting for at  least 8 hours.  Glucose, capillary     Status: Abnormal   Collection Time: 08/26/20  9:10 PM  Result Value Ref Range   Glucose-Capillary 153 (H) 70 - 99 mg/dL    Comment: Glucose reference range applies only to samples taken after fasting for at least 8 hours.   Comment 1 Notify RN    Comment 2 Document in Chart   Basic metabolic panel     Status: Abnormal   Collection Time: 08/27/20  4:45 AM  Result Value Ref Range   Sodium 141 135 - 145 mmol/L   Potassium 3.0 (L) 3.5 - 5.1 mmol/L   Chloride 101 98 - 111 mmol/L   CO2 30 22 - 32 mmol/L   Glucose, Bld 215 (H) 70 - 99 mg/dL    Comment: Glucose reference range applies only to samples taken after fasting for at least 8 hours.   BUN 57 (H) 8  - 23 mg/dL   Creatinine, Ser 2.80 (H) 0.61 - 1.24 mg/dL   Calcium 8.6 (L) 8.9 - 10.3 mg/dL   GFR calc non Af Amer 20 (L) >60 mL/min   GFR calc Af Amer 23 (L) >60 mL/min   Anion gap 10 5 - 15    Comment: Performed at [facility] [address], [place], [place] [address]  Magnesium     Status: None   Collection Time: 08/27/20  4:45 AM  Result Value Ref Range   Magnesium 2.1 1.7 - 2.4 mg/dL    Comment: Performed at [facility] [address], [place], [place] [address]  Glucose, capillary     Status: Abnormal   Collection Time: 08/27/20  6:13 AM  Result Value Ref Range   Glucose-Capillary 189 (H) 70 - 99 mg/dL    Comment: Glucose reference range applies only to samples taken after fasting for at least 8 hours.  Glucose, capillary     Status: Abnormal   Collection Time: 08/27/20  6:28 AM  Result Value Ref Range   Glucose-Capillary 185 (H) 70 - 99 mg/dL    Comment: Glucose reference range applies only to samples taken after fasting for at least 8 hours.   Comment 1 Notify RN    Comment 2 Document in Chart   Glucose, capillary     Status: Abnormal   Collection Time: 08/27/20 11:53 AM  Result Value Ref Range   Glucose-Capillary 118 (H) 70 - 99 mg/dL    Comment: Glucose reference range applies only to samples taken after fasting for at least 8 hours.  Basic metabolic panel     Status: Abnormal   Collection Time: 08/27/20  2:07 PM  Result Value Ref Range   Sodium 143 135 - 145 mmol/L   Potassium 3.7 3.5 - 5.1 mmol/L    Comment: DELTA CHECK NOTED NO VISIBLE HEMOLYSIS    Chloride 99 98 - 111 mmol/L   CO2 33 (H) 22 - 32 mmol/L   Glucose, Bld 132 (H) 70 - 99 mg/dL    Comment: Glucose reference range applies only to samples taken after fasting for at least 8 hours.   BUN 55 (H) 8 - 23 mg/dL   Creatinine, Ser 2.69 (H) 0.61 - 1.24 mg/dL   Calcium 8.9 8.9 - 10.3 mg/dL   GFR calc non Af Amer 21 (L) >60 mL/min   GFR calc Af Amer 24 (L) >60 mL/min   Anion gap 11 5 - 15     Comment: Performed at [facility]  [facility], [address], [place], [place] [address]  Glucose, capillary     Status: None   Collection Time: 08/27/20  4:14 PM  Result Value Ref Range   Glucose-Capillary 80 70 - 99 mg/dL    Comment: Glucose reference range applies only to samples taken after fasting for at least 8 hours.  Glucose, capillary     Status: Abnormal   Collection Time: 08/27/20  9:13 PM  Result Value Ref Range   Glucose-Capillary 146 (H) 70 - 99 mg/dL    Comment: Glucose reference range applies only to samples taken after fasting for at least 8 hours.  Glucose, capillary     Status: Abnormal   Collection Time: 08/28/20  6:21 AM  Result Value Ref Range   Glucose-Capillary 135 (H) 70 - 99 mg/dL    Comment: Glucose reference range applies only to samples taken after fasting for at least 8 hours.  Magnesium     Status: None   Collection Time: 08/28/20  8:00 AM  Result Value Ref Range   Magnesium 2.0 1.7 - 2.4 mg/dL    Comment: Performed at [facility], [place] [address], [place], [place] [address]  Renal function panel     Status: Abnormal   Collection Time: 08/28/20  8:00 AM  Result Value Ref Range   Sodium 139 135 - 145 mmol/L   Potassium 3.3 (L) 3.5 - 5.1 mmol/L   Chloride 93 (L) 98 - 111 mmol/L   CO2 33 (H) 22 - 32 mmol/L   Glucose, Bld 133 (H) 70 - 99 mg/dL    Comment: Glucose reference range applies only to samples taken after fasting for at least 8 hours.   BUN 51 (H) 8 - 23 mg/dL   Creatinine, Ser 2.43 (H) 0.61 - 1.24 mg/dL   Calcium 9.0 8.9 - 10.3 mg/dL   Phosphorus 3.5 2.5 - 4.6 mg/dL   Albumin 3.1 (L) 3.5 - 5.0 g/dL   GFR calc non Af Amer 23 (L) >60 mL/min   GFR calc Af Amer 27 (L) >60 mL/min   Anion gap 13 5 - 15    Comment: Performed at [facility] [address] [place],  [address]    ECG   N/A - Personally Reviewed  Telemetry   afib with CVR - Personally Reviewed  Radiology    NM CARDIAC AMYLOID TUMOR LOC INFLAM SPECT 1  DAY  Result Date: 08/26/2020 CLINICAL DATA:  HEART FAILURE. CONCERN FOR CARDIAC AMYLOIDOSIS. EXAM: NUCLEAR MEDICINE TUMOR LOCALIZATION. PYP CARDIAC AMYLOIDOSIS SCAN WITH SPECT TECHNIQUE: Following intravenous administration of radiopharmaceutical, anterior planar images of the chest were obtained. Regions of interest were placed on the heart and contralateral chest wall for quantitative assessment. Additional SPECT imaging of the chest was obtained. RADIOPHARMACEUTICALS:  19.5 mCi TECHNETIUM 99 PYROPHOSPHATE FINDINGS: Planar Visual assessment: Anterior planar imaging demonstrates radiotracer uptake within the heart less than than uptake within the adjacent ribs (Grade 0). Quantitative assessment : Quantitative assessment of the cardiac uptake compared to the contralateral chest wall is equal to 0.89 SPECT assessment: SPECT imaging of the chest demonstrates no appreciable radiotracer accumulation within the LEFT ventricle. IMPRESSION: Visual and quantitative assessment (grade 0, H/CLL equal 0.89) are not suggestive of transthyretin amyloidosis. Electronically Signed   By: Kerby Moors M.D.   On: 08/26/2020 18:10    Cardiac Studies   See above  Assessment   1. Principal Problem: 2.   Acute on chronic right heart failure ([place]) 3. Active Problems:  4.   Acute on chronic renal failure ([facility]) 5.   OSA (obstructive sleep apnea) 6.   Diabetes ([place]) 7.   Plan   1. Mr. Firkus continues to improve with diuresis - now 8L negative. Creatinine is improving. Would continue with lasix gtts today - plan for TEE/DCCV tomorrow at 12:30. He has asked that I perform the procedure since he has been my patient for some time. He has been on Eliquis continuously since 12/9, but not long enough to ensure freedom from thrombus, hence the TEE.   Time Spent Directly with Patient:  I have spent a total of 25 minutes with the patient reviewing hospital notes, telemetry, EKGs, labs and examining the patient as well as  establishing an assessment and plan that was discussed personally with the patient.  > 50% of time was spent in direct patient care.  Length of Stay:  LOS: 5 days   Pixie Casino, MD, [facility], [place] Director of the Advanced Lipid Disorders &  Cardiovascular Risk Reduction Clinic Diplomate of the American Board of Clinical Lipidology Attending Cardiologist  Direct Dial: [phone]  Fax: [phone]  Website:  [email or web address] Eytan Carrigan 08/28/2020, 9:06 AM

## 2020-08-28 NOTE — Progress Notes (Signed)
Inpatient Diabetes Program Recommendations  AACE/ADA: New Consensus Statement on Inpatient Glycemic Control (2015)  Target Ranges:  Prepandial:   less than 140 mg/dL      Peak postprandial:   less than 180 mg/dL (1-2 hours)      Critically ill patients:  140 - 180 mg/dL   Lab Results  Component Value Date   GLUCAP 338 (H) 08/28/2020   HGBA1C 6.4 (H) 07/20/2020    Review of Glycemic Control Results for BRYER, COZZOLINO (MRN [id number]) as of 08/28/2020 14:17  Ref. Range 08/27/2020 11:53 08/27/2020 16:14 08/27/2020 21:13 08/28/2020 06:21 08/28/2020 11:49  Glucose-Capillary Latest Ref Range: 70 - 99 mg/dL 118 (H) 80 146 (H) 135 (H) 338 (H)   Diabetes history: DM2 Outpatient Diabetes medications: Lantus 18 units qhs Current orders for Inpatient glycemic control: Novolog 5 units tid with meals  Inpatient Diabetes Program Recommendations:     Lantus 10 units QHS  Will continue to follow while inpatient.  Thank you, Reche Dixon, RN, BSN Diabetes Coordinator Inpatient Diabetes Program [phone] (team pager from 8a-5p)

## 2020-08-28 NOTE — Progress Notes (Signed)
Subjective: HD 4 Overnight, no acute events reported.  [name] was evaluated at bedside this morning. He is resting comfortably in bed. He notes significant improvement in his breathing and notes that he feels back to his baseline. He was able to lie flat at night. He does endorse left knee pain since last night. Discussed plans for possible cardioversion tomorrow; patient expresses understanding and is agreeable with the plan.  Objective:  Vital signs in last 24 hours: Vitals:   08/27/20 1929 08/27/20 2014 08/28/20 0013 08/28/20 0613  BP: 130/72   127/72  Pulse: 69  71 70  Resp: 20  18 20   Temp: 99.1 F (37.3 C)   98.3 F (36.8 C)  TempSrc: Oral   Oral  SpO2: 97% 94% 92% 93%  Weight:    81.2 kg  Height:       CBC Latest Ref Rng & Units 08/23/2020 08/16/2020 08/14/2020  WBC 4.0 - 10.5 K/uL 7.3 6.1 7.1  Hemoglobin 13.0 - 17.0 g/dL 12.7(L) 13.1 14.2  Hematocrit 39 - 52 % 41.7 41.3 44.8  Platelets 150 - 400 K/uL 192 152 191   BMP Latest Ref Rng & Units 08/27/2020 08/27/2020 08/26/2020  Glucose 70 - 99 mg/dL 132(H) 215(H) 177(H)  BUN 8 - 23 mg/dL 55(H) 57(H) 61(H)  Creatinine 0.61 - 1.24 mg/dL 2.69(H) 2.80(H) 3.24(H)  BUN/Creat Ratio 10 - 24 - - -  Sodium 135 - 145 mmol/L 143 141 139  Potassium 3.5 - 5.1 mmol/L 3.7 3.0(L) 3.7  Chloride 98 - 111 mmol/L 99 101 103  CO2 22 - 32 mmol/L 33(H) 30 25  Calcium 8.9 - 10.3 mg/dL 8.9 8.6(L) 8.7(L)   I/O last 3 completed shifts: In: 8786 [P.O.:1320; I.V.:160] Out: 5800 [Urine:5800] Total I/O In: -  Out: 2600 [Urine:2600]  Net IO Since Admission: -9,000 mL [08/28/20 0655]  Physical Exam  Constitutional: Appears well-developed and well-nourished. No distress.  Cardiovascular: Normal rate, regular rhythm, S1 and S2 present, systolic murmur; no rubs, gallops.  Distal pulses intact; JVD present  Respiratory: No respiratory distress, no accessory muscle use.  Effort is normal.  Lungs are clear to auscultation bilaterally. GI:  Nondistended, soft, nontender to palpation, normal active bowel sounds Musculoskeletal: Normal bulk and tone.  1+ pitting edema and dependent edema to bilateral thighs  Neurological: Is alert and oriented x4, no apparent focal deficits noted. Skin: Warm and dry.  No rash, erythema, lesions noted.  Assessment/Plan: [name] is a 86 year old male with PMHx of hypertension, hyperlipidemia, right-sided diastolic heart failure, obstructive sleep apnea, COPD, CAD s/p CABG, chronic kidney disease, and recent diagnosis of atrial fibrillation on Eliquis admitted for acute heart failure exacerbation and worsening renal function.  Acute diastolic heart failure exacerbation Patient presented with dyspnea in setting of acute on chronic right heart failure exacerbation. He has been diuresing with lasix and metolazone. -4.1L UOP over past 24 hours and net -8.5L since admission. Noted to have weight down to 81kg down from 87kg; although suspect that this is probably inaccurate. Appears close to euvolemia at this time. Will hold metolazone for today.   Serum IFE pending but light chain ratio not consistent with myeloma at this time.  - Continue diuresis with IV Lasix 20mg /hr - Serial BMP's for electrolyte monitoring and replacement - Strict I&O and daily weights - F/u Serum IFE    Acute on chronic renal failure  Patient with increased urine output and improving serum creatinine with diuresis. sCr down to 2.43  this morning.  - Nephrology consulted, appreciate their recommendations - Continuing diuresis with lasix  - Continuing to hold ACEi - Continue to monitor renal function and avoid nephrotoxic agents as able   Atrial fibrillation  CHADS2-VASc score 5. Patient noted to have new onset atrial fibrillation at cardiology visit on 9/9 for which he has been started on anticoagulation.Cardiology consulted and patient scheduled for TEE/DCCV tomorrow.  - Continue Eliquis 2.5mg  bid - Continue cardiac  monitoring  - Holding beta blocker in setting of bradycardia - Goal K>4, Mg >2  Hypertension Currently holding ACE in setting of AKI. Well controlled on clonidine - Continue clonidine 0.1mg  bid   Obstructive sleep apnea: - Continue CPAP at night   COPD:  - Continue duonebs and albuterol prn  Diet: Renal/Carb modified Fluids/Electrolytes: Monitor and replete prn DVT Prophylaxis: Eliquis Code status:  FULL   Prior to Admission Living Arrangement: Home Anticipated Discharge Location: Home w/HH Barriers to Discharge: Continued medical management Dispo: Anticipated discharge in approximately 1-2 day(s).   Joseph Heck, MD  IMTS PGY-2 08/28/2020, 6:55 AM Pager: [phone] After 5pm on weekdays and 1pm on weekends: On Call pager [phone]

## 2020-08-28 NOTE — TOC Progression Note (Signed)
Transition of Care [facility]) - Progression Note    Patient Details  Name: [name] MRN: [id number] Date of Birth: 12-24-1931  [facility] CM/SW Contact  Zenon Mayo, RN Phone Number: 08/28/2020, 11:00 AM  Clinical Narrative:    Patient is active with [facility] for HHRN,HHPT, will need [facility] orders.        Expected Discharge Plan and Services                                                 Social Determinants of Health (SDOH) Interventions    Readmission Risk Interventions No flowsheet data found.

## 2020-08-28 NOTE — Progress Notes (Signed)
Physical Therapy Treatment Patient Details Name: [name] MRN: [id number] DOB: [birth year] Today's Date: 08/28/2020    History of Present Illness Mr. Stefanski is a 86 year old male with a PMHx significant for HTN, HLD, COPD, OSA, CKD 3b, recent diagnosis of afib (on Eliquis 2.5 mg), CAD s/p CABG, and HFpEF who presents for worsening shortness of breath and leg swelling. Patient states that after discharge, he was 'feeling good.' He states earlier last weekend he went to a funeral where he had some catering with baked chicken, rice and beans. After the weekend, he began to have worsening leg swelling, shortness of breath and weakness. He mentions that he also noted increased swelling in his groin and his penis which caused him to have difficulty urinating    PT Comments    Pt sitting up in chair with c/o L knee pain after ambulating 400 feet with Mobility specialist. Pt requires some encouragement however agrees to exercise in chair. Pt educated that moving LEs will help with pain and stiffness. Pt reports that pain did decrease with movement. Encouraged pt to continue gentle ROM in knees and ankle while he is sitting up. D/c plans remain appropriate. PT will continue to follow acutely.    Follow Up Recommendations  Home health PT;Supervision - Intermittent     Equipment Recommendations  None recommended by PT       Precautions / Restrictions Precautions Precautions: Fall Precaution Comments: mod fall Restrictions Weight Bearing Restrictions: No       Balance Overall balance assessment: Mild deficits observed, not formally tested Sitting-balance support: Feet supported Sitting balance-Leahy Scale: Good                                      Cognition Arousal/Alertness: Awake/alert Behavior During Therapy: WFL for tasks assessed/performed Overall Cognitive Status: Within Functional Limits for tasks assessed                                         Exercises General Exercises - Lower Extremity Ankle Circles/Pumps: AROM;Both;10 reps;Seated Quad Sets: AROM;Both;10 reps;Seated Gluteal Sets: AROM;Both;10 reps;Seated Long Arc Quad: AROM;Both;Seated Hip Flexion/Marching: AROM;Both;Seated;10 reps    General Comments General comments (skin integrity, edema, etc.): VSS on 3L O2      Pertinent Vitals/Pain Pain Assessment: 0-10 Faces Pain Scale: Hurts even more Pain Location: L knee with sitting after ambulation  Pain Descriptors / Indicators: Grimacing;Guarding Pain Intervention(s): Limited activity within patient's tolerance;Monitored during session;Repositioned           PT Goals (current goals can now be found in the care plan section) Acute Rehab PT Goals Patient Stated Goal: Get to where I can do for myself PT Goal Formulation: With patient Time For Goal Achievement: 08/31/20 Potential to Achieve Goals: Good Progress towards PT goals: Not progressing toward goals - comment (limited by pain after ambulation with mobility tech )    Frequency    Min 3X/week      PT Plan Current plan remains appropriate       AM-PAC PT "6 Clicks" Mobility   Outcome Measure  Help needed turning from your back to your side while in a flat bed without using bedrails?: None Help needed moving from lying on your back to sitting on the side of a flat bed without using bedrails?:  None Help needed moving to and from a bed to a chair (including a wheelchair)?: A Little Help needed standing up from a chair using your arms (e.g., wheelchair or bedside chair)?: None Help needed to walk in hospital room?: A Little Help needed climbing 3-5 steps with a railing? : A Little 6 Click Score: 21    End of Session Equipment Utilized During Treatment: Oxygen Activity Tolerance: Patient limited by pain Patient left: in chair;with call bell/phone within reach;with family/visitor present Nurse Communication: Mobility status PT Visit Diagnosis:  Difficulty in walking, not elsewhere classified (R26.2)     Time: 8657-8469 PT Time Calculation (min) (ACUTE ONLY): 14 min  Charges:  $Therapeutic Exercise: 8-22 mins                     Kenyata Napier B. Migdalia Dk PT, DPT Acute Rehabilitation Services Pager [phone] Office [phone]    [place] 08/28/2020, 3:01 PM

## 2020-08-28 NOTE — Progress Notes (Signed)
  Mobility Specialist Criteria Algorithm Info.  SATURATION QUALIFICATIONS: (This note is used to comply with regulatory documentation for home oxygen)  Patient Saturations on Room Air at Rest = 96%  Patient Saturations on Room Air while Ambulating = 85%  Patient Saturations on 4 Liters of oxygen while Ambulating = 93%  Please briefly explain why patient needs home oxygen: Required 4LO2 + pursed lip breathing to maintain an oxygen saturation >93%. Even on 4L, pt desaturates w/excertion requiring proper breathing techniques  Mobility Team:  [facility] elevated:Self regulated Activity: Ambulated in hall;Transferred:  Bed to chair (to chair after ambulation ) Range of motion: Active;All extremities Level of assistance: Standby assist, set-up cues, supervision of patient - no hands on Assistive device: None;Other (Comment) (IV Pole) Minutes sitting in chair:  Minutes stood: 6 minutes Minutes ambulated: 6 minutes Distance ambulated (ft): 400 ft Mobility response: Tolerated well;RN notified Bed Position: Chair ([place] chair)  Pt received in bed c/o L knee pain which appeared to be slightly inflamed and painful when palpated. Although pt in pain he was willing to participate in mobility. Pt tolerated ambulation well reporting that his knee pain subsided with ambulation. After ambulating in hallway pt sat in recliner chair w/o any complaints other than his knee was still slightly in pain once seated. RN and PT notified.  08/28/2020 3:36 PM

## 2020-08-28 NOTE — Progress Notes (Signed)
Patient reports last bm 08/25/20. Denies feelings of constipation or discomfort. This RN offered to page physician for prn stool softener. Patient declined & stated he would let nursing know if he feels a stool softener is needed. Will con't to monitor.

## 2020-08-28 NOTE — Progress Notes (Signed)
PT Cancellation Note  Patient Details Name: [name] MRN: [id number] DOB: 07-Jun-1932   Cancelled Treatment:    Reason Eval/Treat Not Completed: (P) Patient declined, no reason specified Pt just returned from bathroom after BM and requests PT come back later. PT will follow back this afternoon for treatment.   [name] B. Migdalia Dk PT, DPT Acute Rehabilitation Services Pager [phone] Office [phone]    [place] 08/28/2020, 10:17 AM

## 2020-08-29 ENCOUNTER — Encounter (HOSPITAL_COMMUNITY): Payer: Self-pay | Admitting: Student in an Organized Health Care Education/Training Program

## 2020-08-29 ENCOUNTER — Inpatient Hospital Stay (HOSPITAL_COMMUNITY): Payer: Medicare Other

## 2020-08-29 ENCOUNTER — Inpatient Hospital Stay (HOSPITAL_COMMUNITY): Payer: Medicare Other | Admitting: Anesthesiology

## 2020-08-29 ENCOUNTER — Other Ambulatory Visit: Payer: Self-pay | Admitting: *Deleted

## 2020-08-29 ENCOUNTER — Encounter (HOSPITAL_COMMUNITY)
Admission: EM | Disposition: A | Discharge status: Home Health | Payer: Self-pay | Source: Home / Self Care | Attending: Student in an Organized Health Care Education/Training Program

## 2020-08-29 DIAGNOSIS — I4891 Unspecified atrial fibrillation: Secondary | ICD-10-CM

## 2020-08-29 DIAGNOSIS — R10819 Abdominal tenderness, unspecified site: Secondary | ICD-10-CM

## 2020-08-29 HISTORY — PX: TEE WITHOUT CARDIOVERSION: SHX5443

## 2020-08-29 HISTORY — PX: CARDIOVERSION: SHX1299

## 2020-08-29 LAB — RENAL FUNCTION PANEL
Albumin: 2.8 g/dL — ABNORMAL LOW (ref 3.5–5.0)
Anion gap: 10 (ref 5–15)
BUN: 51 mg/dL — ABNORMAL HIGH (ref 8–23)
CO2: 36 mmol/L — ABNORMAL HIGH (ref 22–32)
Calcium: 8.5 mg/dL — ABNORMAL LOW (ref 8.9–10.3)
Chloride: 95 mmol/L — ABNORMAL LOW (ref 98–111)
Creatinine, Ser: 2.43 mg/dL — ABNORMAL HIGH (ref 0.61–1.24)
GFR calc Af Amer: 27 mL/min — ABNORMAL LOW (ref >60)
GFR calc non Af Amer: 23 mL/min — ABNORMAL LOW (ref >60)
Glucose, Bld: 144 mg/dL — ABNORMAL HIGH (ref 70–99)
Phosphorus: 3 mg/dL (ref 2.5–4.6)
Potassium: 3.2 mmol/L — ABNORMAL LOW (ref 3.5–5.1)
Sodium: 141 mmol/L (ref 135–145)

## 2020-08-29 LAB — CBC WITH DIFFERENTIAL/PLATELET
Abs Immature Granulocytes: 0.02 10*3/uL (ref 0.00–0.07)
Basophils Absolute: 0 10*3/uL (ref 0.0–0.1)
Basophils Relative: 0 %
Eosinophils Absolute: 0.1 10*3/uL (ref 0.0–0.5)
Eosinophils Relative: 1 %
HCT: 37.2 % — ABNORMAL LOW (ref 39.0–52.0)
Hemoglobin: 11.9 g/dL — ABNORMAL LOW (ref 13.0–17.0)
Immature Granulocytes: 0 %
Lymphocytes Relative: 9 %
Lymphs Abs: 0.6 10*3/uL — ABNORMAL LOW (ref 0.7–4.0)
MCH: 27.5 pg (ref 26.0–34.0)
MCHC: 32 g/dL (ref 30.0–36.0)
MCV: 85.9 fL (ref 80.0–100.0)
Monocytes Absolute: 0.6 10*3/uL (ref 0.1–1.0)
Monocytes Relative: 8 %
Neutro Abs: 5.9 10*3/uL (ref 1.7–7.7)
Neutrophils Relative %: 82 %
Platelets: 174 10*3/uL (ref 150–400)
RBC: 4.33 MIL/uL (ref 4.22–5.81)
RDW: 15.3 % (ref 11.5–15.5)
WBC: 7.2 10*3/uL (ref 4.0–10.5)
nRBC: 0 % (ref 0.0–0.2)

## 2020-08-29 LAB — GLUCOSE, CAPILLARY
Glucose-Capillary: 157 mg/dL — ABNORMAL HIGH (ref 70–99)
Glucose-Capillary: 147 mg/dL — ABNORMAL HIGH (ref 70–99)
Glucose-Capillary: 92 mg/dL (ref 70–99)
Glucose-Capillary: 169 mg/dL — ABNORMAL HIGH (ref 70–99)
Glucose-Capillary: 133 mg/dL — ABNORMAL HIGH (ref 70–99)

## 2020-08-29 LAB — URIC ACID: Uric Acid, Serum: 16.4 mg/dL — ABNORMAL HIGH (ref 3.7–8.6)

## 2020-08-29 SURGERY — ECHOCARDIOGRAM, TRANSESOPHAGEAL
Anesthesia: General

## 2020-08-29 MED ORDER — POTASSIUM CHLORIDE CRYS ER 20 MEQ PO TBCR
40.0000 meq | EXTENDED_RELEASE_TABLET | Freq: Once | ORAL | Status: AC
  Administered 2020-08-29: 40 meq via ORAL
  Filled 2020-08-29: qty 2

## 2020-08-29 MED ORDER — BUTAMBEN-TETRACAINE-BENZOCAINE 2-2-14 % EX AERO
INHALATION_SPRAY | CUTANEOUS | Status: DC | PRN
  Administered 2020-08-29: 2 via TOPICAL

## 2020-08-29 MED ORDER — INSULIN GLARGINE 100 UNIT/ML ~~LOC~~ SOLN
10.0000 [IU] | Freq: Once | SUBCUTANEOUS | Status: AC
  Administered 2020-08-29: 10 [IU] via SUBCUTANEOUS
  Filled 2020-08-29: qty 0.1

## 2020-08-29 MED ORDER — INSULIN ASPART 100 UNIT/ML ~~LOC~~ SOLN
0.0000 [IU] | SUBCUTANEOUS | Status: DC
  Administered 2020-08-29: 3 [IU] via SUBCUTANEOUS
  Administered 2020-08-29 (×2): 2 [IU] via SUBCUTANEOUS
  Administered 2020-08-30: 3 [IU] via SUBCUTANEOUS
  Administered 2020-08-30 (×2): 2 [IU] via SUBCUTANEOUS

## 2020-08-29 MED ORDER — PROPOFOL 500 MG/50ML IV EMUL
INTRAVENOUS | Status: DC | PRN
  Administered 2020-08-29: 200 ug/kg/min via INTRAVENOUS

## 2020-08-29 MED ORDER — SODIUM CHLORIDE 0.9 % IV SOLN: INTRAVENOUS | Status: DC

## 2020-08-29 MED ORDER — FUROSEMIDE 80 MG PO TABS
80.0000 mg | ORAL_TABLET | Freq: Every day | ORAL | Status: DC
  Administered 2020-08-30: 80 mg via ORAL
  Filled 2020-08-29: qty 1

## 2020-08-29 MED ORDER — PROPOFOL 10 MG/ML IV BOLUS
INTRAVENOUS | Status: DC | PRN
  Administered 2020-08-29 (×2): 20 mg via INTRAVENOUS

## 2020-08-29 MED ORDER — SODIUM CHLORIDE 0.9 % IV SOLN: INTRAVENOUS | Status: DC | PRN

## 2020-08-29 MED ORDER — POTASSIUM CHLORIDE CRYS ER 20 MEQ PO TBCR: 40.0000 meq | EXTENDED_RELEASE_TABLET | Freq: Two times a day (BID) | ORAL | Status: DC

## 2020-08-29 NOTE — Interval H&P Note (Signed)
History and Physical Interval Note:  08/29/2020 11:56 AM  [name]  has presented today for surgery, with the diagnosis of A-FIB.  The various methods of treatment have been discussed with the patient and family. After consideration of risks, benefits and other options for treatment, the patient has consented to  Procedure(s): TRANSESOPHAGEAL ECHOCARDIOGRAM (TEE) (N/A) CARDIOVERSION (N/A) as a surgical intervention.  The patient's history has been reviewed, patient examined, no change in status, stable for surgery.  I have reviewed the patient's chart and labs.  Questions were answered to the patient's satisfaction.     [name]

## 2020-08-29 NOTE — Progress Notes (Signed)
MD was paged by RN that patient was complaining of 10/10 left knee pain and on her assessment, the knee looked red, swollen and warm to touch. MD went to evaluate patient at the bedside. On assessment, there was a tender area of erythema with edema and warmth on the anterior aspect of the left knee. Patient endorsed pain in that area but denied any other symptoms. He reported that the pain started 2 days ago and has gotten worse overnight. Findings consistent with a Cellulitis vs. Erysipela. No signs of systemic infection. VSS. Patient is on DVT prophylaxis w/ Eliquis. He was given tylenol for pain and a morning CBC was ordered.

## 2020-08-29 NOTE — Anesthesia Preprocedure Evaluation (Signed)
Anesthesia Evaluation  Patient identified by MRN, date of birth, ID band Patient awake    Reviewed: Allergy & Precautions, NPO status , Patient's Chart, lab work & pertinent test results  Airway Mallampati: I  TM Distance: >3 FB Neck ROM: Full    Dental   Pulmonary sleep apnea , former smoker,    Pulmonary exam normal        Cardiovascular hypertension, Pt. on medications + CAD, + Past MI and + CABG  Normal cardiovascular exam     Neuro/Psych    GI/Hepatic   Endo/Other  diabetes  Renal/GU Renal InsufficiencyRenal disease     Musculoskeletal   Abdominal   Peds  Hematology   Anesthesia Other Findings   Reproductive/Obstetrics                             Anesthesia Physical Anesthesia Plan  ASA: III  Anesthesia Plan: General   Post-op Pain Management:    Induction: Intravenous  PONV Risk Score and Plan: 2  Airway Management Planned: Nasal Cannula and Mask  Additional Equipment:   Intra-op Plan:   Post-operative Plan:   Informed Consent: I have reviewed the patients History and Physical, chart, labs and discussed the procedure including the risks, benefits and alternatives for the proposed anesthesia with the patient or authorized representative who has indicated his/her understanding and acceptance.       Plan Discussed with: CRNA and Surgeon  Anesthesia Plan Comments:         Anesthesia Quick Evaluation

## 2020-08-29 NOTE — Care Management Important Message (Signed)
Important Message  Patient Details  Name: [name] MRN: [id number] Date of Birth: 1932/06/18   Medicare Important Message Given:  Yes     Shelda Altes 08/29/2020, 10:24 AM

## 2020-08-29 NOTE — Anesthesia Procedure Notes (Signed)
Procedure Name: MAC Date/Time: 08/29/2020 12:03 PM Performed by: Lieutenant Diego, CRNA Pre-anesthesia Checklist: Patient identified, Emergency Drugs available, Suction available, Patient being monitored and Timeout performed Patient Re-evaluated:Patient Re-evaluated prior to induction Oxygen Delivery Method: Nasal cannula Induction Type: IV induction

## 2020-08-29 NOTE — Progress Notes (Signed)
Patient ID: [name], male   DOB: 1932-11-20, 86 y.o.   MRN: [id number]  S: feels ok.  Has been on 3 liters oxygen here and on 3 liters at home per he and his wife, who is at bedside.  Had TEE/cardioversion today.  He states he's on lasix 80 mg daily at home.   Review of systems:  Just ordered lunch  - had been NPO for procedure earlier  Urinating ok - asks for urinal Denies shortness of breath   O:BP 119/66 (BP Location: Right Arm)   Pulse 64   Temp 98.2 F (36.8 C) (Oral)   Resp 18   Ht 5\' 9"  (1.753 m)   Wt 79.2 kg   SpO2 99%   BMI 25.80 kg/m   Intake/Output Summary (Last 24 hours) at 08/29/2020 1347 Last data filed at 08/29/2020 1215 Gross per 24 hour  Intake 672.28 ml  Output 2110 ml  Net -1437.72 ml   Intake/Output: I/O last 3 completed shifts: In: 1059.3 [P.O.:795; I.V.:264.3] Out: 4300 [Urine:4300]  Intake/Output this shift:  Total I/O In: 100 [I.V.:100] Out: 410 [Urine:410] Weight change: -1.95 kg   Gen: adult male in bed in nad CVS: S1S2 no rub Resp: basilar crackles; unlabored at rest; on 3 liters oxygen  Abd: soft/NT/ND Ext: trace pretibial edema Neuro awake and interactive; provides hx and follows commands Psych normal mood and affect  Recent Labs  Lab 08/23/20 1331 08/23/20 2105 08/25/20 0054 08/25/20 1310 08/26/20 0623 08/27/20 0445 08/27/20 1407 08/28/20 0800 08/29/20 0933  NA 140   < > 139 140 139 141 143 139 141  K 6.0*   < > 4.8 4.5 3.7 3.0* 3.7 3.3* 3.2*  CL 109   < > 106 104 103 101 99 93* 95*  CO2 23   < > 24 25 25 30  33* 33* 36*  GLUCOSE 85   < > 154* 146* 177* 215* 132* 133* 144*  BUN 56*   < > 60* 61* 61* 57* 55* 51* 51*  CREATININE 3.69*   < > 3.30* 3.37* 3.24* 2.80* 2.69* 2.43* 2.43*  ALBUMIN 3.3*  --   --   --   --   --   --  3.1* 2.8*  CALCIUM 8.9   < > 8.7* 8.7* 8.7* 8.6* 8.9 9.0 8.5*  PHOS  --   --   --   --   --   --   --  3.5 3.0  AST 22  --   --   --   --   --   --   --   --   ALT 18  --   --   --   --   --   --   --    --    < > = values in this interval not displayed.   Liver Function Tests: Recent Labs  Lab 08/23/20 1331 08/28/20 0800 08/29/20 0933  AST 22  --   --   ALT 18  --   --   ALKPHOS 109  --   --   BILITOT 0.8  --   --   PROT 6.6  --   --   ALBUMIN 3.3* 3.1* 2.8*   No results for input(s): LIPASE, AMYLASE in the last 168 hours. No results for input(s): AMMONIA in the last 168 hours. CBC: Recent Labs  Lab 08/23/20 1331 08/28/20 1131 08/29/20 0933  WBC 7.3 7.0 7.2  NEUTROABS 5.9  --  5.9  HGB 12.7* 12.0* 11.9*  HCT 41.7 38.4* 37.2*  MCV 89.5 85.9 85.9  PLT 192 166 174   Cardiac Enzymes: No results for input(s): CKTOTAL, CKMB, CKMBINDEX, TROPONINI in the last 168 hours. CBG: Recent Labs  Lab 08/28/20 1617 08/28/20 2058 08/29/20 0602 08/29/20 0834 08/29/20 1314  GLUCAP 106* 172* 157* 147* 92    Iron Studies: No results for input(s): IRON, TIBC, TRANSFERRIN, FERRITIN in the last 72 hours. Studies/Results: No results found. Marland Kitchen apixaban  2.5 mg Oral BID  . cloNIDine  0.1 mg Oral BID  . ezetimibe  10 mg Oral Daily  . furosemide  80 mg Oral BID  . icosapent Ethyl  2 g Oral BID  . insulin aspart  0-15 Units Subcutaneous Q4H  . levothyroxine  100 mcg Oral Q24H  . potassium chloride  40 mEq Oral BID  . rosuvastatin  20 mg Oral Daily  . sodium chloride flush  3 mL Intravenous Q12H    BMET    Component Value Date/Time   NA 141 08/29/2020 0933   NA 142 06/02/2019 1644   K 3.2 (L) 08/29/2020 0933   CL 95 (L) 08/29/2020 0933   CO2 36 (H) 08/29/2020 0933   GLUCOSE 144 (H) 08/29/2020 0933   BUN 51 (H) 08/29/2020 0933   BUN 18 06/02/2019 1644   CREATININE 2.43 (H) 08/29/2020 0933   CREATININE 1.54 (H) 03/31/2017 1028   CALCIUM 8.5 (L) 08/29/2020 0933   GFRNONAA 23 (L) 08/29/2020 0933   GFRAA 27 (L) 08/29/2020 0933   CBC    Component Value Date/Time   WBC 7.2 08/29/2020 0933   RBC 4.33 08/29/2020 0933   HGB 11.9 (L) 08/29/2020 0933   HCT 37.2 (L) 08/29/2020  0933   PLT 174 08/29/2020 0933   MCV 85.9 08/29/2020 0933   MCH 27.5 08/29/2020 0933   MCHC 32.0 08/29/2020 0933   RDW 15.3 08/29/2020 0933   LYMPHSABS 0.6 (L) 08/29/2020 0933   MONOABS 0.6 08/29/2020 0933   EOSABS 0.1 08/29/2020 0933   BASOSABS 0.0 08/29/2020 0933     [name] an 86 y.o.malewith HTN, DM, CKD 3, CHF, CAD CABG '96, recent A fib, CHF (normal EF, grade 2 DD, mod dec RV function - 07/2020) who is seen for evaluation and management of AKI on CKD.  Assessment/Plan:  1. AKI/CKD stage (baseline Scr 1.7-2) in setting of decompensated CHF consistent cardiorenal syndrome with concomitant ACE inhibition. Continues to diurese with steady improvement of BUN/CR. 1. Continue to hold ACE and continue to follow UOP and Scr  2. No indication for dialysis at this time. Continue with medical management. 3. He will need to establish outpatient follow up with our office after discharge 2. Acute on chronic combined systolic and diastolic CHF- with decreased RV function and DD. Didn't initially respond to lasix drip but doing better with larger dose of 20 mg/hr and metolazone 5 mg daily. Received 2 doses of IV lasix 120 on 9/19 and 9/20.  1. Continue lasix - bicarb continues to rise - reduce back to 80 mg daily for now  2. Continue with sodium and fluid restriction.  3. Hyperkalemia- resolved off ACE and s/p lokelma (now off).  Potassium 40 meq once now (adjusted ordered repletion) 4. A fib on eliquis but no beta-blocker due to bradycardia. S/p cardioversion. Per cardiology 5. OSA on CPAP 6. HTN- controlled 7. COPD- per primary  Claudia Desanctis, MD 08/29/2020  1:55 PM

## 2020-08-29 NOTE — Anesthesia Postprocedure Evaluation (Signed)
Anesthesia Post Note  Patient: [name]  Procedure(s) Performed: TRANSESOPHAGEAL ECHOCARDIOGRAM (TEE) (N/A ) CARDIOVERSION (N/A )     Patient location during evaluation: PACU Anesthesia Type: General Level of consciousness: awake and alert Pain management: pain level controlled Vital Signs Assessment: post-procedure vital signs reviewed and stable Respiratory status: spontaneous breathing, nonlabored ventilation, respiratory function stable and patient connected to nasal cannula oxygen Cardiovascular status: stable and blood pressure returned to baseline Postop Assessment: no apparent nausea or vomiting Anesthetic complications: no   No complications documented.  Last Vitals:  Vitals:   08/29/20 1250 08/29/20 1317  BP: (!) 117/52 119/66  Pulse: 64 64  Resp: 10 18  Temp:  36.8 C  SpO2: 93% 99%    Last Pain:  Vitals:   08/29/20 1317  TempSrc: Oral  PainSc:                  Cynda Soule DAVID

## 2020-08-29 NOTE — CV Procedure (Signed)
TEE/CARDIOVERSION NOTE  TRANSESOPHAGEAL ECHOCARDIOGRAM (TEE):  Indictation: Atrial Fibrillation  Consent:   Informed consent was obtained prior to the procedure. The risks, benefits and alternatives for the procedure were discussed and the patient comprehended these risks.  Risks include, but are not limited to, cough, sore throat, vomiting, nausea, somnolence, esophageal and stomach trauma or perforation, bleeding, low blood pressure, aspiration, pneumonia, infection, trauma to the teeth and death.    Time Out: Verified patient identification, verified procedure, site/side was marked, verified correct patient position, special equipment/implants available, medications/allergies/relevent history reviewed, required imaging and test results available. Performed  Procedure:  After a procedural time-out, the patient was given propofol per aneshtesia for sedation. The patient's heart rate, blood pressure, and oxygen saturation are monitored continuously during the procedure. The oropharynx was anesthetized with topical cetacaine spray.  The transesophageal probe was inserted in the esophagus and stomach without difficulty and multiple views were obtained. Agitated microbubble saline contrast was not administered.  Complications:    Complications: None Patient did tolerate procedure well.  Findings:  1. LEFT VENTRICLE: The left ventricular wall thickness is moderately increased.  The left ventricular cavity is normal in size. Wall motion is normal.  LVEF is 60-65%.  2. RIGHT VENTRICLE:  The right ventricle is dilated and demonstrates moderately reduced RV systolic function.  3. LEFT ATRIUM:  The left atrium is normal in size without any thrombus or masses.  There is not spontaneous echo contrast ("smoke") in the left atrium consistent with a low flow state.  4. LEFT ATRIAL APPENDAGE:  The left atrial appendage is free of any thrombus or masses. The appendage has single lobes. Pulse  doppler indicates moderate flow in the appendage.  5. ATRIAL SEPTUM:  The atrial septum appears intact and is free of thrombus and/or masses.  There is no evidence for interatrial shunting by color doppler and saline microbubble.  6. RIGHT ATRIUM:  The right atrium is mild to moderately dilated in size and function without any thrombus or masses.  7. MITRAL VALVE:  The mitral valve is normal in structure and function with Mild regurgitation.  There were no vegetations or stenosis.  8. AORTIC VALVE:  The aortic valve is trileaflet, normal in structure and function with no regurgitation.  There were no vegetations or stenosis  9. TRICUSPID VALVE:  The tricuspid valve is normal in structure and function with Moderate regurgitation. RVSP is 29 mmHg + RAP. There were no vegetations or stenosis  10.  PULMONIC VALVE:  The pulmonic valve is normal in structure and function with no regurgitation.  There were no vegetations or stenosis.   11. AORTIC ARCH, ASCENDING AND DESCENDING AORTA:  There was grade 2 Ron Parker et. Al, 1992) atherosclerosis of the aortic arch and proximal descending aorta.  12. PULMONARY VEINS: Anomalous pulmonary venous return was not noted.  13. PERICARDIUM: The pericardium appeared normal and non-thickened.  There is no pericardial effusion.  CARDIOVERSION:     Second Time Out: Verified patient identification, verified procedure, site/side was marked, verified correct patient position, special equipment/implants available, medications/allergies/relevent history reviewed, required imaging and test results available.  Performed  Procedure:  1. Patient placed on cardiac monitor, pulse oximetry, supplemental oxygen as necessary.  2. Sedation administered per anesthesia 3. Pacer pads placed anterior and posterior chest. 4. Cardioverted 1 time(s).  5. Cardioverted at 150J biphasic.  Complications:  Complications: None Patient did tolerate procedure well.  Impression:  1. No  LAA thrombus 2. Negative for PFO by color doppler  3. Mild MR, mild to moderate TR 4. Mild to moderate RAE 5. Dilated RV with moderate systolic dysfunction 6. LVEF 60-65%, moderate LVH 7. Successful DCCV with a single 150J Biphasic shock  Recommendations:  1. Continue Eliquis and medical therapy for acute on chronic right heart failure.   Time Spent Directly with the Patient:  45 minutes   Pixie Casino, MD, [facility], [place] Director of the Advanced Lipid Disorders &  Cardiovascular Risk Reduction Clinic Diplomate of the American Board of Clinical Lipidology Attending Cardiologist  Direct Dial: [phone]  Fax: [phone]  Website:  [email or web address] Sueann Brownley 08/29/2020, 12:25 PM

## 2020-08-29 NOTE — Transfer of Care (Signed)
Immediate Anesthesia Transfer of Care Note  Patient: [name]  Procedure(s) Performed: TRANSESOPHAGEAL ECHOCARDIOGRAM (TEE) (N/A ) CARDIOVERSION (N/A )  Patient Location: Endoscopy Unit  Anesthesia Type:MAC  Level of Consciousness: awake  Airway & Oxygen Therapy: Patient Spontanous Breathing and Patient connected to nasal cannula oxygen  Post-op Assessment: Report given to RN and Post -op Vital signs reviewed and stable  Post vital signs: Reviewed and stable  Last Vitals:  Vitals Value Taken Time  BP    Temp    Pulse    Resp    SpO2      Last Pain:  Vitals:   08/29/20 1107  TempSrc: Oral  PainSc: 0-No pain         Complications: No complications documented.

## 2020-08-29 NOTE — Patient Outreach (Signed)
[facility]) Care Management  08/29/2020  [name] 11-Mar-1932 [id number]    Received notification from [facility] Liaison Natividad Brood), patient currently hospitalized for Acute on chronic right heart failure , and remains inpatient.   No patient outreach needed at this time due to hospitalization.  RNCM will follow up with patient within 3 business days of notification of patient's hospital discharge.    Jacey Pelc H. Annia Friendly, BSN, [place] [facility] Telephonic CM Phone: [phone] Fax: [phone]

## 2020-08-29 NOTE — Progress Notes (Addendum)
DAILY PROGRESS NOTE   Patient Name: [name] Date of Encounter: 08/29/2020 Cardiologist: Pixie Casino, MD  Chief Complaint   No complaints  Patient Profile   [name] is a 86 y.o. male with a hx of Chronic diastolic CHF, mixed pulmonary and arterial hypertension followed by pulmonology, CAD with CABG in 1996, CKD, HTN, dyslipidemia, RBBB, OSA on CPAP,  who is being seen today for the evaluation of Afib and CHF at the request of Dr. Evette Doffing.  Subjective   Diuresed less yesterday - he is nearing end-diuresis. Transitioned over to po lasix by nephrology. Labs pending today. Plan for TEE/DCCV today.  Objective   Vitals:   08/28/20 1944 08/29/20 0345 08/29/20 0839 08/29/20 0841  BP: (!) 120/59 120/73 119/63   Pulse: 71 (!) 56 70 60  Resp: 16 18 20    Temp: 98.6 F (37 C) 98.2 F (36.8 C) 98.8 F (37.1 C)   TempSrc: Oral Oral Oral   SpO2: 97% 97% (!) 88% 95%  Weight:  79.2 kg    Height:        Intake/Output Summary (Last 24 hours) at 08/29/2020 0919 Last data filed at 08/29/2020 1601 Gross per 24 hour  Intake 572.28 ml  Output 1700 ml  Net -1127.72 ml   Filed Weights   08/27/20 0345 08/28/20 0613 08/29/20 0345  Weight: 87 kg 81.2 kg 79.2 kg    Physical Exam   General appearance: alert and no distress Neck: no carotid bruit, no JVD and thyroid not enlarged, symmetric, no tenderness/mass/nodules Lungs: diminished breath sounds bibasilar Heart: irregularly irregular rhythm Abdomen: soft, non-tender; bowel sounds normal; no masses,  no organomegaly Extremities: extremities normal, atraumatic, no cyanosis or edema Pulses: 2+ and symmetric Skin: Skin color, texture, turgor normal. No rashes or lesions Neurologic: Mental status: Alert, oriented, thought content appropriate Psych: Pleasant  Inpatient Medications    Scheduled Meds: . apixaban  2.5 mg Oral BID  . cloNIDine  0.1 mg Oral BID  . ezetimibe  10 mg Oral Daily  . furosemide  80 mg Oral BID  .  icosapent Ethyl  2 g Oral BID  . insulin aspart  0-15 Units Subcutaneous Q4H  . levothyroxine  100 mcg Oral Q24H  . rosuvastatin  20 mg Oral Daily  . sodium chloride flush  3 mL Intravenous Q12H    Continuous Infusions: . sodium chloride    . sodium chloride 20 mL/hr at 08/29/20 0406    PRN Meds: sodium chloride, acetaminophen, ipratropium-albuterol, ondansetron (ZOFRAN) IV, sodium chloride flush   Labs   Results for orders placed or performed during the hospital encounter of 08/23/20 (from the past 48 hour(s))  Glucose, capillary     Status: Abnormal   Collection Time: 08/27/20 11:53 AM  Result Value Ref Range   Glucose-Capillary 118 (H) 70 - 99 mg/dL    Comment: Glucose reference range applies only to samples taken after fasting for at least 8 hours.  Basic metabolic panel     Status: Abnormal   Collection Time: 08/27/20  2:07 PM  Result Value Ref Range   Sodium 143 135 - 145 mmol/L   Potassium 3.7 3.5 - 5.1 mmol/L    Comment: DELTA CHECK NOTED NO VISIBLE HEMOLYSIS    Chloride 99 98 - 111 mmol/L   CO2 33 (H) 22 - 32 mmol/L   Glucose, Bld 132 (H) 70 - 99 mg/dL    Comment: Glucose reference range applies only to samples taken after fasting for  at least 8 hours.   BUN 55 (H) 8 - 23 mg/dL   Creatinine, Ser 2.69 (H) 0.61 - 1.24 mg/dL   Calcium 8.9 8.9 - 10.3 mg/dL   GFR calc non Af Amer 21 (L) >60 mL/min   GFR calc Af Amer 24 (L) >60 mL/min   Anion gap 11 5 - 15    Comment: Performed at [facility] [address], [place], [place] [address]  Glucose, capillary     Status: None   Collection Time: 08/27/20  4:14 PM  Result Value Ref Range   Glucose-Capillary 80 70 - 99 mg/dL    Comment: Glucose reference range applies only to samples taken after fasting for at least 8 hours.  Glucose, capillary     Status: Abnormal   Collection Time: 08/27/20  9:13 PM  Result Value Ref Range   Glucose-Capillary 146 (H) 70 - 99 mg/dL    Comment: Glucose reference range applies  only to samples taken after fasting for at least 8 hours.  Glucose, capillary     Status: Abnormal   Collection Time: 08/28/20  6:21 AM  Result Value Ref Range   Glucose-Capillary 135 (H) 70 - 99 mg/dL    Comment: Glucose reference range applies only to samples taken after fasting for at least 8 hours.  Magnesium     Status: None   Collection Time: 08/28/20  8:00 AM  Result Value Ref Range   Magnesium 2.0 1.7 - 2.4 mg/dL    Comment: Performed at [facility], [place] [address], [place], [place] [address]  Renal function panel     Status: Abnormal   Collection Time: 08/28/20  8:00 AM  Result Value Ref Range   Sodium 139 135 - 145 mmol/L   Potassium 3.3 (L) 3.5 - 5.1 mmol/L   Chloride 93 (L) 98 - 111 mmol/L   CO2 33 (H) 22 - 32 mmol/L   Glucose, Bld 133 (H) 70 - 99 mg/dL    Comment: Glucose reference range applies only to samples taken after fasting for at least 8 hours.   BUN 51 (H) 8 - 23 mg/dL   Creatinine, Ser 2.43 (H) 0.61 - 1.24 mg/dL   Calcium 9.0 8.9 - 10.3 mg/dL   Phosphorus 3.5 2.5 - 4.6 mg/dL   Albumin 3.1 (L) 3.5 - 5.0 g/dL   GFR calc non Af Amer 23 (L) >60 mL/min   GFR calc Af Amer 27 (L) >60 mL/min   Anion gap 13 5 - 15    Comment: Performed at [facility] [address] [place], [place] [address]  CBC     Status: Abnormal   Collection Time: 08/28/20 11:31 AM  Result Value Ref Range   WBC 7.0 4.0 - 10.5 K/uL   RBC 4.47 4.22 - 5.81 MIL/uL   Hemoglobin 12.0 (L) 13.0 - 17.0 g/dL   HCT 38.4 (L) 39 - 52 %   MCV 85.9 80.0 - 100.0 fL   MCH 26.8 26.0 - 34.0 pg   MCHC 31.3 30.0 - 36.0 g/dL   RDW 15.4 11.5 - 15.5 %   Platelets 166 150 - 400 K/uL   nRBC 0.0 0.0 - 0.2 %    Comment: Performed at [facility], [place] [address] [place], [place] [address]  Glucose, capillary     Status: Abnormal   Collection Time: 08/28/20 11:49 AM  Result Value Ref Range   Glucose-Capillary 338 (H) 70 - 99 mg/dL  Comment: Glucose reference range applies only to samples  taken after fasting for at least 8 hours.  Glucose, capillary     Status: Abnormal   Collection Time: 08/28/20  4:17 PM  Result Value Ref Range   Glucose-Capillary 106 (H) 70 - 99 mg/dL    Comment: Glucose reference range applies only to samples taken after fasting for at least 8 hours.  Glucose, capillary     Status: Abnormal   Collection Time: 08/28/20  8:58 PM  Result Value Ref Range   Glucose-Capillary 172 (H) 70 - 99 mg/dL    Comment: Glucose reference range applies only to samples taken after fasting for at least 8 hours.  Glucose, capillary     Status: Abnormal   Collection Time: 08/29/20  6:02 AM  Result Value Ref Range   Glucose-Capillary 157 (H) 70 - 99 mg/dL    Comment: Glucose reference range applies only to samples taken after fasting for at least 8 hours.  Glucose, capillary     Status: Abnormal   Collection Time: 08/29/20  8:34 AM  Result Value Ref Range   Glucose-Capillary 147 (H) 70 - 99 mg/dL    Comment: Glucose reference range applies only to samples taken after fasting for at least 8 hours.    ECG   N/A - Personally Reviewed  Telemetry   afib with CVR - Personally Reviewed  Radiology    No results found.  Cardiac Studies   See above  Assessment   Principal Problem:   Acute on chronic right heart failure (HCC) Active Problems:   Acute on chronic renal failure (HCC)   OSA (obstructive sleep apnea)   Diabetes ([place])   Plan   1. [name] is nearing end-diuresis today - plan for TEE/DCCV. Noted to have some left knee pain overnight - improved some with tylenol - no erythma, effusion or warmth - could be OA or possibly gout, no history of this. Transitioned to oral lasix - awaiting labs.  Time Spent Directly with Patient:  I have spent a total of 25 minutes with the patient reviewing hospital notes, telemetry, EKGs, labs and examining the patient as well as establishing an assessment and plan that was discussed personally with the patient.  > 50% of  time was spent in direct patient care.  Length of Stay:  LOS: 6 days   Pixie Casino, MD, [facility], [place] Director of the Advanced Lipid Disorders &  Cardiovascular Risk Reduction Clinic Diplomate of the American Board of Clinical Lipidology Attending Cardiologist  Direct Dial: [phone]  Fax: [phone]  Website:  [email or web address] 08/29/2020, 9:19 AM

## 2020-08-29 NOTE — Progress Notes (Addendum)
HD#6 Subjective:  Overnight Events: Patient with worsening left knee pain treated with acetaminophen.   Patient revaluated at bedside this morning. He states his knee pain has significantly improved this morning with improved swelling and range of motion. States breathing remains improved. Discussed plan for TEE and cardioversion today which he remains agreeable to.   Objective:  Vital signs in last 24 hours: Vitals:   08/28/20 0613 08/28/20 1153 08/28/20 1944 08/29/20 0345  BP: 127/72 (!) 109/53 (!) 120/59 120/73  Pulse: 70 65 71 (!) 56  Resp: 20 18 16 18   Temp: 98.3 F (36.8 C) 98.4 F (36.9 C) 98.6 F (37 C) 98.2 F (36.8 C)  TempSrc: Oral Oral Oral Oral  SpO2: 93% 98% 97% 97%  Weight: 81.2 kg   79.2 kg  Height:       Supplemental O2: Nasal Cannula SpO2: 97 % O2 Flow Rate (L/min): 5 L/min FiO2 (%): 32 %   Physical Exam:  Physical Exam Constitutional:      Appearance: He is well-developed.  Neck:     Vascular: No JVD.  Cardiovascular:     Rate and Rhythm: Normal rate and regular rhythm.  Abdominal:     Palpations: Abdomen is soft.     Tenderness: There is abdominal tenderness.  Musculoskeletal:     Right lower leg: No edema.     Left lower leg: No edema.     Comments: Erythema of the left knee without swelling, mildly tender to palpation. Normal ROM, Minimal sacral edema  Neurological:     Mental Status: He is alert.     Filed Weights   08/27/20 0345 08/28/20 0613 08/29/20 0345  Weight: 87 kg 81.2 kg 79.2 kg     Intake/Output Summary (Last 24 hours) at 08/29/2020 0527 Last data filed at 08/29/2020 0459 Gross per 24 hour  Intake 812.28 ml  Output 2300 ml  Net -1487.72 ml   Net IO Since Admission: -9,240.73 mL [08/29/20 0527]  Pertinent Labs: CBC Latest Ref Rng & Units 08/28/2020 08/23/2020 08/16/2020  WBC 4.0 - 10.5 K/uL 7.0 7.3 6.1  Hemoglobin 13.0 - 17.0 g/dL 12.0(L) 12.7(L) 13.1  Hematocrit 39 - 52 % 38.4(L) 41.7 41.3  Platelets 150 - 400 K/uL  166 192 152    CMP Latest Ref Rng & Units 08/28/2020 08/27/2020 08/27/2020  Glucose 70 - 99 mg/dL 133(H) 132(H) 215(H)  BUN 8 - 23 mg/dL 51(H) 55(H) 57(H)  Creatinine 0.61 - 1.24 mg/dL 2.43(H) 2.69(H) 2.80(H)  Sodium 135 - 145 mmol/L 139 143 141  Potassium 3.5 - 5.1 mmol/L 3.3(L) 3.7 3.0(L)  Chloride 98 - 111 mmol/L 93(L) 99 101  CO2 22 - 32 mmol/L 33(H) 33(H) 30  Calcium 8.9 - 10.3 mg/dL 9.0 8.9 8.6(L)  Total Protein 6.5 - 8.1 g/dL - - -  Total Bilirubin 0.3 - 1.2 mg/dL - - -  Alkaline Phos 38 - 126 U/L - - -  AST 15 - 41 U/L - - -  ALT 0 - 44 U/L - - -    Imaging: No results found.  Assessment/Plan:   Principal Problem:   Acute on chronic right heart failure (HCC) Active Problems:   Acute on chronic renal failure (HCC)   OSA (obstructive sleep apnea)   Diabetes [facility])   Patient Summary: [name] is a 86 year old male with PMHx of hypertension, hyperlipidemia, right-sided diastolic heart failure, obstructive sleep apnea, COPD, CAD s/p CABG, chronic kidney disease, and recent diagnosis of atrial fibrillation  on Eliquis admitted for acute heart failure exacerbation and worsening renal function.   Acute on chronic right heart failure Patient presented with dyspnea in setting of acute on chronic right heart failure exacerbation. He has been diuresing with lasix with -1.3 L UOP over past 24 hours and net -13.9 L since admission. Noted to have weight down to 79.2 kg down from 87 kg. Appears close to euvolemia at this time. Transitioned to PO lasix yesterday. Serum IFE pending but light chain ratio not consistent with myeloma at this time.  - Lasix 80mg  twice daily - Serial BMP's for electrolyte monitoring and replacement - Strict I&O and daily weights - F/u Serum IFE  - PT recommending home health PT.   Acute on chronic renal failure  Patient with Cr stable  2.43 this morning. Decreased UOP after transition to oral diuretics. - Nephrology consulted, appreciate their  recommendations - Continuing diuresis with lasix  - Continuing to hold ACEi - Continue to monitor renal function and avoid nephrotoxic agents as able  - Will need outpatient nephrology follow up   Atrial fibrillation  CHADS2-VASc score 5. Patient noted to have new onset atrial fibrillation at cardiology visit on 9/9 for which he has been started on anticoagulation. Cardiology consulted and patient scheduled for TEE/DCCV today. - Continue Eliquis 2.5mg  bid - Replete potassium  - Continue cardiac monitoring  - Holding beta blocker in setting of bradycardia - Goal K>4, Mg >2  Left knee pain Patient with left knee pain and redness for the past 2 days, improved with acetaminophen 650 mg. No history of gout in the past.  Patient with ereythema of left knee without effusion and normal ROM. Uric acid of 16.4.Knee pain likely secondary to gout in the setting of diuresis. Appears to be resolving without intervention. Will consider corticosteroid if pain worsens or fails to improve.   Hypertension Currently holding ACE in setting of AKI. Well controlled on clonidine - Continue clonidine 0.1mg  bid    Obstructive sleep apnea: - Continue CPAP at night    COPD:  - Continue duonebs and albuterol prn   Diet: Renal/Carb modified Fluids/Electrolytes: Monitor and replete prn DVT Prophylaxis: Eliquis Code status:  FULL    Prior to Admission Living Arrangement: Home Anticipated Discharge Location: Home w/HH Barriers to Discharge: Continued medical management Dispo: Anticipated discharge in approximately 1-2 day(s).    Iona Beard, MD 08/29/2020, 5:27 AM Pager: [phone]  Please contact the on call pager after 5 pm and on weekends at [phone].

## 2020-08-30 ENCOUNTER — Other Ambulatory Visit: Payer: Self-pay | Admitting: Student

## 2020-08-30 ENCOUNTER — Other Ambulatory Visit: Payer: Self-pay | Admitting: *Deleted

## 2020-08-30 ENCOUNTER — Encounter (HOSPITAL_COMMUNITY): Payer: Self-pay | Admitting: Internal Medicine

## 2020-08-30 DIAGNOSIS — M109 Gout, unspecified: Secondary | ICD-10-CM

## 2020-08-30 LAB — CBC
HCT: 37.5 % — ABNORMAL LOW (ref 39.0–52.0)
Hemoglobin: 11.9 g/dL — ABNORMAL LOW (ref 13.0–17.0)
MCH: 28 pg (ref 26.0–34.0)
MCHC: 31.7 g/dL (ref 30.0–36.0)
MCV: 88.2 fL (ref 80.0–100.0)
Platelets: 174 10*3/uL (ref 150–400)
RBC: 4.25 MIL/uL (ref 4.22–5.81)
RDW: 15.5 % (ref 11.5–15.5)
WBC: 6.1 10*3/uL (ref 4.0–10.5)
nRBC: 0 % (ref 0.0–0.2)

## 2020-08-30 LAB — RENAL FUNCTION PANEL
Albumin: 2.8 g/dL — ABNORMAL LOW (ref 3.5–5.0)
Anion gap: 10 (ref 5–15)
BUN: 51 mg/dL — ABNORMAL HIGH (ref 8–23)
CO2: 36 mmol/L — ABNORMAL HIGH (ref 22–32)
Calcium: 8.6 mg/dL — ABNORMAL LOW (ref 8.9–10.3)
Chloride: 97 mmol/L — ABNORMAL LOW (ref 98–111)
Creatinine, Ser: 2.33 mg/dL — ABNORMAL HIGH (ref 0.61–1.24)
GFR calc Af Amer: 28 mL/min — ABNORMAL LOW (ref >60)
GFR calc non Af Amer: 24 mL/min — ABNORMAL LOW (ref >60)
Glucose, Bld: 159 mg/dL — ABNORMAL HIGH (ref 70–99)
Phosphorus: 3.1 mg/dL (ref 2.5–4.6)
Potassium: 3.2 mmol/L — ABNORMAL LOW (ref 3.5–5.1)
Sodium: 143 mmol/L (ref 135–145)

## 2020-08-30 LAB — PROTEIN ELECTROPHORESIS, SERUM
A/G Ratio: 1.1 (ref 0.7–1.7)
Albumin ELP: 3 g/dL (ref 2.9–4.4)
Alpha-1-Globulin: 0.2 g/dL (ref 0.0–0.4)
Alpha-2-Globulin: 0.7 g/dL (ref 0.4–1.0)
Beta Globulin: 0.9 g/dL (ref 0.7–1.3)
Gamma Globulin: 1 g/dL (ref 0.4–1.8)
Globulin, Total: 2.8 g/dL (ref 2.2–3.9)
Total Protein ELP: 5.8 g/dL — ABNORMAL LOW (ref 6.0–8.5)

## 2020-08-30 LAB — GLUCOSE, CAPILLARY
Glucose-Capillary: 122 mg/dL — ABNORMAL HIGH (ref 70–99)
Glucose-Capillary: 173 mg/dL — ABNORMAL HIGH (ref 70–99)
Glucose-Capillary: 135 mg/dL — ABNORMAL HIGH (ref 70–99)
Glucose-Capillary: 137 mg/dL — ABNORMAL HIGH (ref 70–99)

## 2020-08-30 MED ORDER — METOPROLOL SUCCINATE ER 25 MG PO TB24
12.5000 mg | ORAL_TABLET | Freq: Every day | ORAL | Status: DC
  Administered 2020-08-30: 12.5 mg via ORAL
  Filled 2020-08-30: qty 1

## 2020-08-30 MED ORDER — FUROSEMIDE 80 MG PO TABS: 80.0000 mg | ORAL_TABLET | Freq: Every day | ORAL | 0 refills | Status: DC

## 2020-08-30 MED ORDER — INSULIN GLARGINE 100 UNIT/ML ~~LOC~~ SOLN
10.0000 [IU] | Freq: Every day | SUBCUTANEOUS | Status: DC
  Administered 2020-08-30: 10 [IU] via SUBCUTANEOUS
  Filled 2020-08-30: qty 0.1

## 2020-08-30 MED ORDER — POTASSIUM CHLORIDE CRYS ER 20 MEQ PO TBCR
40.0000 meq | EXTENDED_RELEASE_TABLET | Freq: Once | ORAL | Status: AC
  Administered 2020-08-30: 40 meq via ORAL
  Filled 2020-08-30: qty 2

## 2020-08-30 MED ORDER — METOPROLOL SUCCINATE ER 25 MG PO TB24
12.5000 mg | ORAL_TABLET | Freq: Every day | ORAL | 0 refills | Status: DC
Start: 2020-08-30 — End: 2020-11-11

## 2020-08-30 NOTE — Progress Notes (Signed)
Patient ID: MCLANE ARORA, male   DOB: March 26, 1932, 86 y.o.   MRN: [id number] S: Feels much better today and walking around the halls with PT.  Successful DCCV 08/29/20 O:BP 120/66 (BP Location: Left Arm)    Pulse 62    Temp 97.9 F (36.6 C)    Resp 18    Ht 5\' 9"  (1.753 m)    Wt 78 kg Comment: scale b   SpO2 100%    BMI 25.40 kg/m   Intake/Output Summary (Last 24 hours) at 08/30/2020 1427 Last data filed at 08/30/2020 1312 Gross per 24 hour  Intake 720 ml  Output 620 ml  Net 100 ml   Intake/Output: I/O last 3 completed shifts: In: 552.3 [P.O.:435; I.V.:117.3] Out: 2190 [Urine:2190]  Intake/Output this shift:  Total I/O In: 360 [P.O.:360] Out: -  Weight change: -1.225 kg Gen: NAD CVS: RRR  Resp:cta Abd: benign Ext: trace edema  Recent Labs  Lab 08/25/20 1310 08/26/20 0623 08/27/20 0445 08/27/20 1407 08/28/20 0800 08/29/20 0933 08/30/20 1008  NA 140 139 141 143 139 141 143  K 4.5 3.7 3.0* 3.7 3.3* 3.2* 3.2*  CL 104 103 101 99 93* 95* 97*  CO2 25 25 30  33* 33* 36* 36*  GLUCOSE 146* 177* 215* 132* 133* 144* 159*  BUN 61* 61* 57* 55* 51* 51* 51*  CREATININE 3.37* 3.24* 2.80* 2.69* 2.43* 2.43* 2.33*  ALBUMIN  --   --   --   --  3.1* 2.8* 2.8*  CALCIUM 8.7* 8.7* 8.6* 8.9 9.0 8.5* 8.6*  PHOS  --   --   --   --  3.5 3.0 3.1   Liver Function Tests: Recent Labs  Lab 08/28/20 0800 08/29/20 0933 08/30/20 1008  ALBUMIN 3.1* 2.8* 2.8*   No results for input(s): LIPASE, AMYLASE in the last 168 hours. No results for input(s): AMMONIA in the last 168 hours. CBC: Recent Labs  Lab 08/28/20 1131 08/29/20 0933 08/30/20 1008  WBC 7.0 7.2 6.1  NEUTROABS  --  5.9  --   HGB 12.0* 11.9* 11.9*  HCT 38.4* 37.2* 37.5*  MCV 85.9 85.9 88.2  PLT 166 174 174   Cardiac Enzymes: No results for input(s): CKTOTAL, CKMB, CKMBINDEX, TROPONINI in the last 168 hours. CBG: Recent Labs  Lab 08/29/20 2033 08/30/20 0128 08/30/20 0427 08/30/20 0845 08/30/20 1208  GLUCAP 133* 122* 173*  135* 137*    Iron Studies: No results for input(s): IRON, TIBC, TRANSFERRIN, FERRITIN in the last 72 hours. Studies/Results: ECHO TEE  Result Date: 08/29/2020    TRANSESOPHOGEAL ECHO REPORT   Patient Name:   KESTON SEEVER Date of Exam: 08/29/2020 Medical Rec #:  [id number]     Height:       69.0 in Accession #:    [id number]    Weight:       174.7 lb Date of Birth:  [birth year]      BSA:          1.951 m Patient Age:    39 years      BP:           135/65 mmHg Patient Gender: M             HR:           64 bpm. Exam Location:  Inpatient Procedure: Transesophageal Echo Indications:     Atrial Fibrilation; Atrial fibrillation and Flutter  History:         Patient has prior history of Echocardiogram  examinations, most                  recent 02/06/2020.  Sonographer:     Philipp Deputy Referring Phys:  [id number] Nadean Corwin HILTY Diagnosing Phys: Lyman Bishop MD PROCEDURE: After discussion of the risks and benefits of a TEE, an informed consent was obtained from the patient. The transesophogeal probe was passed without difficulty through the esophogus of the patient. Local oropharyngeal anesthetic was provided with Cetacaine. Sedation performed by different physician. The patient was monitored while under deep sedation. Anesthestetic sedation was provided intravenously by Anesthesiology: 162mg  of Propofol. Image quality was adequate. The patient developed no complications during the procedure. A successful direct current cardioversion was performed. IMPRESSIONS  1. Left ventricular ejection fraction, by estimation, is 60 to 65%. The left ventricle has normal function. There is moderate left ventricular hypertrophy.  2. Right ventricular systolic function is moderately reduced. The right ventricular size is mildly enlarged.  3. No left atrial/left atrial appendage thrombus was detected.  4. Right atrial size was mild to moderately dilated.  5. The mitral valve is grossly normal. Trivial mitral valve regurgitation.  6. The  aortic valve is tricuspid. Aortic valve regurgitation is not visualized.  7. There is mild (Grade II) plaque. Conclusion(s)/Recommendation(s): No LA/LAA thrombus identified. Successful cardioversion performed with restoration of normal sinus rhythm. FINDINGS  Left Ventricle: Left ventricular ejection fraction, by estimation, is 60 to 65%. The left ventricle has normal function. The left ventricular internal cavity size was normal in size. There is moderate left ventricular hypertrophy. Right Ventricle: The right ventricular size is mildly enlarged. No increase in right ventricular wall thickness. Right ventricular systolic function is moderately reduced. Left Atrium: Left atrial size was normal in size. No left atrial/left atrial appendage thrombus was detected. Right Atrium: Right atrial size was mild to moderately dilated. Pericardium: There is no evidence of pericardial effusion. Mitral Valve: The mitral valve is grossly normal. Trivial mitral valve regurgitation. Tricuspid Valve: The tricuspid valve is grossly normal. Tricuspid valve regurgitation is mild. Aortic Valve: The aortic valve is tricuspid. Aortic valve regurgitation is not visualized. Pulmonic Valve: The pulmonic valve was grossly normal. Pulmonic valve regurgitation is not visualized. Aorta: The aortic root and ascending aorta are structurally normal, with no evidence of dilitation. There is mild (Grade II) plaque. IAS/Shunts: No atrial level shunt detected by color flow Doppler.  TRICUSPID VALVE TR Peak grad:   29.4 mmHg TR Vmax:        271.00 cm/s Lyman Bishop MD Electronically signed by Lyman Bishop MD Signature Date/Time: 08/29/2020/3:14:37 PM    Final     apixaban  2.5 mg Oral BID   cloNIDine  0.1 mg Oral BID   ezetimibe  10 mg Oral Daily   furosemide  80 mg Oral Daily   icosapent Ethyl  2 g Oral BID   insulin aspart  0-15 Units Subcutaneous Q4H   insulin glargine  10 Units Subcutaneous Daily   levothyroxine  100 mcg Oral Q24H    metoprolol succinate  12.5 mg Oral Daily   rosuvastatin  20 mg Oral Daily   sodium chloride flush  3 mL Intravenous Q12H    BMET    Component Value Date/Time   NA 143 08/30/2020 1008   NA 142 06/02/2019 1644   K 3.2 (L) 08/30/2020 1008   CL 97 (L) 08/30/2020 1008   CO2 36 (H) 08/30/2020 1008   GLUCOSE 159 (H) 08/30/2020 1008   BUN 51 (H) 08/30/2020 1008  BUN 18 06/02/2019 1644   CREATININE 2.33 (H) 08/30/2020 1008   CREATININE 1.54 (H) 03/31/2017 1028   CALCIUM 8.6 (L) 08/30/2020 1008   GFRNONAA 24 (L) 08/30/2020 1008   GFRAA 28 (L) 08/30/2020 1008   CBC    Component Value Date/Time   WBC 6.1 08/30/2020 1008   RBC 4.25 08/30/2020 1008   HGB 11.9 (L) 08/30/2020 1008   HCT 37.5 (L) 08/30/2020 1008   PLT 174 08/30/2020 1008   MCV 88.2 08/30/2020 1008   MCH 28.0 08/30/2020 1008   MCHC 31.7 08/30/2020 1008   RDW 15.5 08/30/2020 1008   LYMPHSABS 0.6 (L) 08/29/2020 0933   MONOABS 0.6 08/29/2020 0933   EOSABS 0.1 08/29/2020 0933   BASOSABS 0.0 08/29/2020 0933   QPY:PPJKDT A Wadeis an 86 y.o.malewith HTN, DM, CKD 3, CHF, CAD CABG '96, recent A fib, CHF (normal EF, grade 2 DD, mod dec RV function - 07/2020) who is seen for evaluation and management of AKI on CKD.  Assessment/Plan:  1. AKI/CKD stage (baseline Scr 1.7-2) in setting of decompensated CHF consistent cardiorenal syndrome with concomitant ACE inhibition. Continues to diurese withsteadyimprovement of BUN/CR. 1. Continue to hold ACE  2. No indication for dialysis at this time. Continue with current regimen as he is nearing his baseline SCr. 3. He will need to establish outpatient follow up with our office after discharge.  Will arrange follow up in 3-4 weeks.  Needs to see pcp within 1-2 weeks of discharge for labs 4. Nothing further to add so will sign off.  Please call with questions or concerns. 2. Acute on chronic combined systolic and diastolic CHF- with decreased RV function and DD.  Didn'tinitiallyrespond to lasix drip but doing better with larger dose of 20 mg/hr and metolazone 5 mg daily. Received 2 doses of IV lasix 120 on 9/19 and 9/20.  1. Stop lasix drip now that his volume status has markedly improved 2. Change to po lasix 80 mg bid and follow UOP and Scr 3. Continue with sodium and fluid restriction.  3. Hyperkalemia- resolved off ACE and lokelmaand now low so will stop lokelma and replete as needed. 4. A fib on eliquis but no beta-blocker due to bradycardia. For possible cardioversion tomorrow per Cardiology eval.  5. OSA on CPAP 6. HTN- stable 7. COPD- per primary  Donetta Potts, MD [facility] [phone]

## 2020-08-30 NOTE — Progress Notes (Signed)
Physical Therapy Treatment Patient Details Name: [name] MRN: [id number] DOB: [birth year] Today's Date: 08/30/2020    History of Present Illness Mr. Rutigliano is a 86 year old male with a PMHx significant for HTN, HLD, COPD, OSA, CKD 3b, recent diagnosis of afib (on Eliquis 2.5 mg), CAD s/p CABG, and HFpEF who presents for worsening shortness of breath and leg swelling. Patient states that after discharge, he was 'feeling good.' He states earlier last weekend he went to a funeral where he had some catering with baked chicken, rice and beans. After the weekend, he began to have worsening leg swelling, shortness of breath and weakness. He mentions that he also noted increased swelling in his groin and his penis which caused him to have difficulty urinating    PT Comments    Pt is agreeable to participate in therapy today and excited for d/c this afternoon. Pt required use of AD this session secondary to L knee pain. Noted L knee swollen, slightly red, and warm to the touch. With RW pt was able to increase ambulation distance and appeared overall steady. Pt reports he had a RW at home he can use. Pt negotiated steps with min A and is expected to d/c home with HHPT to follow up.     Follow Up Recommendations  Home health PT;Supervision - Intermittent     Equipment Recommendations  None recommended by PT    Recommendations for Other Services       Precautions / Restrictions Precautions Precautions: Fall Precaution Comments: mod fall    Mobility  Bed Mobility Overal bed mobility: Modified Independent             General bed mobility comments: up in chair on arrival  Transfers Overall transfer level: Needs assistance Equipment used: None Transfers: Sit to/from Stand Sit to Stand: Supervision         General transfer comment: supervision for safety.  Ambulation/Gait Ambulation/Gait assistance: Supervision;Min guard Gait Distance (Feet): 450 Feet Assistive device: Rolling  walker (2 wheeled) Gait Pattern/deviations: Step-through pattern;Decreased stride length;Trunk flexed Gait velocity: decreased   General Gait Details: Pt initially stood w/o AD however after taking a few unstady steps and reaching for support from furniture and therapist, pt requested use of RW. Pt ambulated well with RW, cues for RW proximity and safety when sitting with RW.    Stairs Stairs: Yes Stairs assistance: Min assist Stair Management: No rails;Forwards;Step to pattern Number of Stairs: 2 General stair comments: Pt required min A for stability to negotiate steps. He reports no hand rails at home but there is a wall on either side to provide stability.    Wheelchair Mobility    Modified Rankin (Stroke Patients Only)       Balance Overall balance assessment: Mild deficits observed, not formally tested Sitting-balance support: Feet supported Sitting balance-Leahy Scale: Good     Standing balance support: During functional activity;No upper extremity supported Standing balance-Leahy Scale: Fair Standing balance comment: able to statically stand, but required use of RW for stability with gait.                            Cognition Arousal/Alertness: Awake/alert Behavior During Therapy: WFL for tasks assessed/performed Overall Cognitive Status: Within Functional Limits for tasks assessed  General Comments: Pt motivated and willing to participate. With enough safety awareness to realize he was more unsteady today than previous sessions and requested use of RW today.       Exercises      General Comments General comments (skin integrity, edema, etc.): L Knee red and swollen. Warm to the touch.      Pertinent Vitals/Pain Pain Assessment: No/denies pain Faces Pain Scale: Hurts a little bit Pain Location: L knee  Pain Descriptors / Indicators: Grimacing;Guarding Pain Intervention(s): Monitored during  session;Limited activity within patient's tolerance    Home Living                      Prior Function            PT Goals (current goals can now be found in the care plan section) Acute Rehab PT Goals Patient Stated Goal: Get to where I can do for myself PT Goal Formulation: With patient Time For Goal Achievement: 08/31/20 Potential to Achieve Goals: Good Progress towards PT goals: Progressing toward goals    Frequency    Min 3X/week      PT Plan Current plan remains appropriate    Co-evaluation              AM-PAC PT "6 Clicks" Mobility   Outcome Measure  Help needed turning from your back to your side while in a flat bed without using bedrails?: None Help needed moving from lying on your back to sitting on the side of a flat bed without using bedrails?: None Help needed moving to and from a bed to a chair (including a wheelchair)?: A Little Help needed standing up from a chair using your arms (e.g., wheelchair or bedside chair)?: None Help needed to walk in hospital room?: A Little Help needed climbing 3-5 steps with a railing? : A Little 6 Click Score: 21    End of Session Equipment Utilized During Treatment: Oxygen;Gait belt Activity Tolerance: Patient tolerated treatment well Patient left: in chair;with call bell/phone within reach Nurse Communication: Mobility status PT Visit Diagnosis: Difficulty in walking, not elsewhere classified (R26.2)     Time: 2482-5003 PT Time Calculation (min) (ACUTE ONLY): 23 min  Charges:  $Gait Training: 23-37 mins                    Benjiman Core, [place] Pager [phone] Acute Rehab   Allena Katz 08/30/2020, 1:41 PM

## 2020-08-30 NOTE — Progress Notes (Signed)
DAILY PROGRESS NOTE   Patient Name: [name] Date of Encounter: 08/30/2020 Cardiologist: Pixie Casino, MD  Chief Complaint   No complaints  Patient Profile   [name] is a 86 y.o. male with a hx of Chronic diastolic CHF, mixed pulmonary and arterial hypertension followed by pulmonology, CAD with CABG in 1996, CKD, HTN, dyslipidemia, RBBB, OSA on CPAP,  who is being seen today for the evaluation of Afib and CHF at the request of Dr. Evette Doffing.  Subjective   Successful DCCV yesterday - maintaining sinus rhythm- LVEF 60-65% without thrombus, moderate RV dysfunction with improved (mild to moderate) pulmonary hypertension.   Objective   Vitals:   08/29/20 1250 08/29/20 1317 08/29/20 1936 08/30/20 0351  BP: (!) 117/52 119/66 (!) 126/58 128/72  Pulse: 64 64 72 77  Resp: 10 18 18 18   Temp:  98.2 F (36.8 C) 99 F (37.2 C) 98.2 F (36.8 C)  TempSrc:  Oral Oral Oral  SpO2: 93% 99% 95% 97%  Weight:    78 kg  Height:        Intake/Output Summary (Last 24 hours) at 08/30/2020 3154 Last data filed at 08/29/2020 2100 Gross per 24 hour  Intake 460 ml  Output 1060 ml  Net -600 ml   Filed Weights   08/28/20 0613 08/29/20 0345 08/30/20 0351  Weight: 81.2 kg 79.2 kg 78 kg    Physical Exam   General appearance: alert and no distress Neck: no carotid bruit, no JVD and thyroid not enlarged, symmetric, no tenderness/mass/nodules Lungs: clear to auscultation bilaterally Heart: regular rate and rhythm Abdomen: soft, non-tender; bowel sounds normal; no masses,  no organomegaly Extremities: extremities normal, atraumatic, no cyanosis or edema Pulses: 2+ and symmetric Skin: Skin color, texture, turgor normal. No rashes or lesions Neurologic: Mental status: Alert, oriented, thought content appropriate Psych: Pleasant  Inpatient Medications    Scheduled Meds: . apixaban  2.5 mg Oral BID  . cloNIDine  0.1 mg Oral BID  . ezetimibe  10 mg Oral Daily  . furosemide  80 mg  Oral Daily  . icosapent Ethyl  2 g Oral BID  . insulin aspart  0-15 Units Subcutaneous Q4H  . insulin glargine  10 Units Subcutaneous Daily  . levothyroxine  100 mcg Oral Q24H  . rosuvastatin  20 mg Oral Daily  . sodium chloride flush  3 mL Intravenous Q12H    Continuous Infusions: . sodium chloride      PRN Meds: sodium chloride, acetaminophen, ipratropium-albuterol, ondansetron (ZOFRAN) IV, sodium chloride flush   Labs   Results for orders placed or performed during the hospital encounter of 08/23/20 (from the past 48 hour(s))  CBC     Status: Abnormal   Collection Time: 08/28/20 11:31 AM  Result Value Ref Range   WBC 7.0 4.0 - 10.5 K/uL   RBC 4.47 4.22 - 5.81 MIL/uL   Hemoglobin 12.0 (L) 13.0 - 17.0 g/dL   HCT 38.4 (L) 39 - 52 %   MCV 85.9 80.0 - 100.0 fL   MCH 26.8 26.0 - 34.0 pg   MCHC 31.3 30.0 - 36.0 g/dL   RDW 15.4 11.5 - 15.5 %   Platelets 166 150 - 400 K/uL   nRBC 0.0 0.0 - 0.2 %    Comment: Performed at [facility], [place] [address] [place], [place] [address]  Glucose, capillary     Status: Abnormal   Collection Time: 08/28/20 11:49 AM  Result Value Ref Range  Glucose-Capillary 338 (H) 70 - 99 mg/dL    Comment: Glucose reference range applies only to samples taken after fasting for at least 8 hours.  Glucose, capillary     Status: Abnormal   Collection Time: 08/28/20  4:17 PM  Result Value Ref Range   Glucose-Capillary 106 (H) 70 - 99 mg/dL    Comment: Glucose reference range applies only to samples taken after fasting for at least 8 hours.  Glucose, capillary     Status: Abnormal   Collection Time: 08/28/20  8:58 PM  Result Value Ref Range   Glucose-Capillary 172 (H) 70 - 99 mg/dL    Comment: Glucose reference range applies only to samples taken after fasting for at least 8 hours.  Glucose, capillary     Status: Abnormal   Collection Time: 08/29/20  6:02 AM  Result Value Ref Range   Glucose-Capillary 157 (H) 70 - 99 mg/dL    Comment: Glucose  reference range applies only to samples taken after fasting for at least 8 hours.  Glucose, capillary     Status: Abnormal   Collection Time: 08/29/20  8:34 AM  Result Value Ref Range   Glucose-Capillary 147 (H) 70 - 99 mg/dL    Comment: Glucose reference range applies only to samples taken after fasting for at least 8 hours.  Renal function panel     Status: Abnormal   Collection Time: 08/29/20  9:33 AM  Result Value Ref Range   Sodium 141 135 - 145 mmol/L   Potassium 3.2 (L) 3.5 - 5.1 mmol/L   Chloride 95 (L) 98 - 111 mmol/L   CO2 36 (H) 22 - 32 mmol/L   Glucose, Bld 144 (H) 70 - 99 mg/dL    Comment: Glucose reference range applies only to samples taken after fasting for at least 8 hours.   BUN 51 (H) 8 - 23 mg/dL   Creatinine, Ser 2.43 (H) 0.61 - 1.24 mg/dL   Calcium 8.5 (L) 8.9 - 10.3 mg/dL   Phosphorus 3.0 2.5 - 4.6 mg/dL   Albumin 2.8 (L) 3.5 - 5.0 g/dL   GFR calc non Af Amer 23 (L) >60 mL/min   GFR calc Af Amer 27 (L) >60 mL/min   Anion gap 10 5 - 15    Comment: Performed at [facility] [address] [place], [place] [address]  CBC with Differential/Platelet     Status: Abnormal   Collection Time: 08/29/20  9:33 AM  Result Value Ref Range   WBC 7.2 4.0 - 10.5 K/uL   RBC 4.33 4.22 - 5.81 MIL/uL   Hemoglobin 11.9 (L) 13.0 - 17.0 g/dL   HCT 37.2 (L) 39 - 52 %   MCV 85.9 80.0 - 100.0 fL   MCH 27.5 26.0 - 34.0 pg   MCHC 32.0 30.0 - 36.0 g/dL   RDW 15.3 11.5 - 15.5 %   Platelets 174 150 - 400 K/uL   nRBC 0.0 0.0 - 0.2 %   Neutrophils Relative % 82 %   Neutro Abs 5.9 1.7 - 7.7 K/uL   Lymphocytes Relative 9 %   Lymphs Abs 0.6 (L) 0.7 - 4.0 K/uL   Monocytes Relative 8 %   Monocytes Absolute 0.6 0 - 1 K/uL   Eosinophils Relative 1 %   Eosinophils Absolute 0.1 0 - 0 K/uL   Basophils Relative 0 %   Basophils Absolute 0.0 0 - 0 K/uL   Immature Granulocytes 0 %   Abs Immature Granulocytes 0.02 0.00 -  0.07 K/uL    Comment: Performed at [facility], [place] [address] [place], [place] [address]  Uric acid     Status: Abnormal   Collection Time: 08/29/20  9:33 AM  Result Value Ref Range   Uric Acid, Serum 16.4 (H) 3.7 - 8.6 mg/dL    Comment: Performed at [facility] [address] [place], [place] [address]  Glucose, capillary     Status: None   Collection Time: 08/29/20  1:14 PM  Result Value Ref Range   Glucose-Capillary 92 70 - 99 mg/dL    Comment: Glucose reference range applies only to samples taken after fasting for at least 8 hours.  Glucose, capillary     Status: Abnormal   Collection Time: 08/29/20  4:44 PM  Result Value Ref Range   Glucose-Capillary 169 (H) 70 - 99 mg/dL    Comment: Glucose reference range applies only to samples taken after fasting for at least 8 hours.  Glucose, capillary     Status: Abnormal   Collection Time: 08/29/20  8:33 PM  Result Value Ref Range   Glucose-Capillary 133 (H) 70 - 99 mg/dL    Comment: Glucose reference range applies only to samples taken after fasting for at least 8 hours.  Glucose, capillary     Status: Abnormal   Collection Time: 08/30/20  1:28 AM  Result Value Ref Range   Glucose-Capillary 122 (H) 70 - 99 mg/dL    Comment: Glucose reference range applies only to samples taken after fasting for at least 8 hours.  Glucose, capillary     Status: Abnormal   Collection Time: 08/30/20  4:27 AM  Result Value Ref Range   Glucose-Capillary 173 (H) 70 - 99 mg/dL    Comment: Glucose reference range applies only to samples taken after fasting for at least 8 hours.  Glucose, capillary     Status: Abnormal   Collection Time: 08/30/20  8:45 AM  Result Value Ref Range   Glucose-Capillary 135 (H) 70 - 99 mg/dL    Comment: Glucose reference range applies only to samples taken after fasting for at least 8 hours.    ECG   Post-DCCV EKG on 9/23 showed sinus rhythm with PAC - Personally Reviewed  Telemetry   Sinus rhythm- Personally Reviewed  Radiology    ECHO TEE  Result Date:  08/29/2020    TRANSESOPHOGEAL ECHO REPORT   Patient Name:   [name] Date of Exam: 08/29/2020 Medical Rec #:  [id number]     Height:       69.0 in Accession #:    [id number]    Weight:       174.7 lb Date of Birth:  1932-05-27      BSA:          1.951 m Patient Age:    21 years      BP:           135/65 mmHg Patient Gender: M             HR:           64 bpm. Exam Location:  Inpatient Procedure: Transesophageal Echo Indications:     Atrial Fibrilation; Atrial fibrillation and Flutter  History:         Patient has prior history of Echocardiogram examinations, most                  recent 02/06/2020.  Sonographer:     Philipp Deputy  Referring Phys:  [id number] Nadean Corwin Hibba Schram Diagnosing Phys: Lyman Bishop MD PROCEDURE: After discussion of the risks and benefits of a TEE, an informed consent was obtained from the patient. The transesophogeal probe was passed without difficulty through the esophogus of the patient. Local oropharyngeal anesthetic was provided with Cetacaine. Sedation performed by different physician. The patient was monitored while under deep sedation. Anesthestetic sedation was provided intravenously by Anesthesiology: 162mg  of Propofol. Image quality was adequate. The patient developed no complications during the procedure. A successful direct current cardioversion was performed. IMPRESSIONS  1. Left ventricular ejection fraction, by estimation, is 60 to 65%. The left ventricle has normal function. There is moderate left ventricular hypertrophy.  2. Right ventricular systolic function is moderately reduced. The right ventricular size is mildly enlarged.  3. No left atrial/left atrial appendage thrombus was detected.  4. Right atrial size was mild to moderately dilated.  5. The mitral valve is grossly normal. Trivial mitral valve regurgitation.  6. The aortic valve is tricuspid. Aortic valve regurgitation is not visualized.  7. There is mild (Grade II) plaque. Conclusion(s)/Recommendation(s): No LA/LAA  thrombus identified. Successful cardioversion performed with restoration of normal sinus rhythm. FINDINGS  Left Ventricle: Left ventricular ejection fraction, by estimation, is 60 to 65%. The left ventricle has normal function. The left ventricular internal cavity size was normal in size. There is moderate left ventricular hypertrophy. Right Ventricle: The right ventricular size is mildly enlarged. No increase in right ventricular wall thickness. Right ventricular systolic function is moderately reduced. Left Atrium: Left atrial size was normal in size. No left atrial/left atrial appendage thrombus was detected. Right Atrium: Right atrial size was mild to moderately dilated. Pericardium: There is no evidence of pericardial effusion. Mitral Valve: The mitral valve is grossly normal. Trivial mitral valve regurgitation. Tricuspid Valve: The tricuspid valve is grossly normal. Tricuspid valve regurgitation is mild. Aortic Valve: The aortic valve is tricuspid. Aortic valve regurgitation is not visualized. Pulmonic Valve: The pulmonic valve was grossly normal. Pulmonic valve regurgitation is not visualized. Aorta: The aortic root and ascending aorta are structurally normal, with no evidence of dilitation. There is mild (Grade II) plaque. IAS/Shunts: No atrial level shunt detected by color flow Doppler.  TRICUSPID VALVE TR Peak grad:   29.4 mmHg TR Vmax:        271.00 cm/s Lyman Bishop MD Electronically signed by Lyman Bishop MD Signature Date/Time: 08/29/2020/3:14:37 PM    Final     Cardiac Studies   See above  Assessment   Principal Problem:   Acute on chronic right heart failure [facility]) Active Problems:   Acute on chronic renal failure (HCC)   OSA (obstructive sleep apnea)   Diabetes (HCC)   Atrial fibrillation ([place])   Plan   1. Mr. Teems feels well and is maintaining sinus rhythm after cardioversion yesterday. He appears at dry weight of 78 kg. Not currently on BB due to bradycardia, however, was on  6.25 mg BID coreg - would recommend low dose Toprol XL 12.5 mg daily for some rate-control if he were to develop recurrent afib. Continue apixan and current diuretic dose. Can follow-up with me in the office as scheduled on 09/20/20.  [facility] will sign off.   Medication Recommendations:  As above Other recommendations (labs, testing, etc):  none Follow up as an outpatient:  Lummie Montijo - scheduled on 09/20/20  Time Spent Directly with Patient:  I have spent a total of 25 minutes with the patient reviewing hospital notes, telemetry, EKGs, labs  and examining the patient as well as establishing an assessment and plan that was discussed personally with the patient.  > 50% of time was spent in direct patient care.  Length of Stay:  LOS: 7 days   Pixie Casino, MD, [facility], [place] Director of the Advanced Lipid Disorders &  Cardiovascular Risk Reduction Clinic Diplomate of the American Board of Clinical Lipidology Attending Cardiologist  Direct Dial: [phone]  Fax: [phone]  Website:  [email or web address] 08/30/2020, 9:07 AM

## 2020-08-30 NOTE — TOC Transition Note (Signed)
Transition of Care [facility]) - CM/SW Discharge Note   Patient Details  Name: [name] MRN: [id number] Date of Birth: [birth year]  Transition of Care [facility]) CM/SW Contact:  Zenon Mayo, RN Phone Number: 08/30/2020, 2:02 PM   Clinical Narrative:    Patient is for dc today, NCM notified Drew with [facility], wife states she will get tub bench from [place].  He has no other needs.   Final next level of care: [place] Barriers to Discharge: No Barriers Identified   Patient Goals and CMS Choice Patient states their goals for this hospitalization and ongoing recovery are:: get better CMS Medicare.gov Compare Post Acute Care list provided to:: Patient Represenative (must comment) Choice offered to / list presented to : Spouse  Discharge Placement                       Discharge Plan and Services                  DME Agency: NA       HH Arranged: RN, PT [place] Agency: [place] Date [place]: 08/30/20 Time [place]: [id number] Representative spoke with at [place]: [place] ([place]) Interventions     Readmission Risk Interventions No flowsheet data found.

## 2020-08-30 NOTE — Patient Outreach (Signed)
[facility]) Care Management  08/30/2020  [name] 08/23/1932 [id number]   Case reviewed, Multidisciplinary Case Discussion (MCD) template completed, template sent to [place] Management Quality Assistant Alycia Rossetti), and case will be discussed on 09/05/2020 with [place] Management team.  No patient outreach at this time due to hospitalization.  RNCM will follow up with patient and / or patient's wife, per patient's request,  within 3 business days of notification of patient's hospital discharge.     Zylie Mumaw H. Annia Friendly, BSN, [place] Management [facility] Telephonic CM Phone: [phone] Fax: [phone]

## 2020-08-30 NOTE — Progress Notes (Signed)
Occupational Therapy Treatment Patient Details Name: [name] MRN: [id number] DOB: 24-May-1932 Today's Date: 08/30/2020    History of present illness [name] is a 86 year old male with a PMHx significant for HTN, HLD, COPD, OSA, CKD 3b, recent diagnosis of afib (on Eliquis 2.5 mg), CAD s/p CABG, and HFpEF who presents for worsening shortness of breath and leg swelling. Patient states that after discharge, he was 'feeling good.' He states earlier last weekend he went to a funeral where he had some catering with baked chicken, rice and beans. After the weekend, he began to have worsening leg swelling, shortness of breath and weakness. He mentions that he also noted increased swelling in his groin and his penis which caused him to have difficulty urinating   OT comments  Pt. Seen for skilled OT treatment session. Focus of session-energy conservation and utilization of PLB tech.  Provided and reviewed handouts with examples of how to integrate into use during ADL completion.  Pt. Verbalized understanding and interest in the examples provided ie: sitting for completion of B/D and also during meal prep.  Reviewed use of A/E also per report that it has been difficult for him to reach BLES.  Try and introduce next session.    Follow Up Recommendations  Home health OT;Supervision/Assistance - 24 hour    Equipment Recommendations  Tub/shower seat;3 in 1 bedside commode    Recommendations for Other Services      Precautions / Restrictions Precautions Precautions: Fall Precaution Comments: mod fall       Mobility Bed Mobility               General bed mobility comments: seated eob for duration of session  Transfers                      Balance                                           ADL either performed or assessed with clinical judgement   ADL Overall ADL's : Needs assistance/impaired                                       General  ADL Comments: provided e. conservation handouts and provided examples and reviewed with pt. along with PLB review.  pt. reports he has noted some difficulty with LB dressing in standing. reviewed with pt. higher fall risk in standing along with using a lot of energy to complete tasks. emphasized benefits for energy conservation and safety to sit when able for completion of ADLs.  discussed benefits of LH sponge to reach BLES in sitting when bathing.  states he does not shower daily and mostly does bird baths.  reviewed again sitting for this task.  pt. states he does most of the meal prep.  reviewed sitting at kitchen table for use of cutting board and meal prep.  he verbalized understanding.  provided demo of PLB.  pt. able to return demo after 3 attemtps. max cues to close mouth/lips when breathing in "smelling".  weak "blow" but still able to demonstrate.     Vision       Perception     Praxis      Cognition Arousal/Alertness: Awake/alert Behavior During Therapy: [facility] for tasks  assessed/performed Overall Cognitive Status: Within Functional Limits for tasks assessed                                          Exercises     Shoulder Instructions       General Comments  reports being married 63-64 years! Says "we just take care of each other" :)- very large family with lots of grand, great, and great great grand children.      Pertinent Vitals/ Pain       Pain Assessment: No/denies pain  Home Living                                          Prior Functioning/Environment              Frequency  Min 2X/week        Progress Toward Goals  OT Goals(current goals can now be found in the care plan section)  Progress towards OT goals: Progressing toward goals     Plan Discharge plan remains appropriate    Co-evaluation                 AM-PAC OT "6 Clicks" Daily Activity     Outcome Measure   Help from another person eating  meals?: None Help from another person taking care of personal grooming?: A Little Help from another person toileting, which includes using toliet, bedpan, or urinal?: A Little Help from another person bathing (including washing, rinsing, drying)?: A Little Help from another person to put on and taking off regular upper body clothing?: A Little Help from another person to put on and taking off regular lower body clothing?: A Lot 6 Click Score: 18    End of Session Equipment Utilized During Treatment: Oxygen  OT Visit Diagnosis: Unsteadiness on feet (R26.81);Other abnormalities of gait and mobility (R26.89);Muscle weakness (generalized) (M62.81)   Activity Tolerance Patient tolerated treatment well   Patient Left in bed;with call bell/phone within reach;Other (comment) (seated eob eating b.fast upon arrival)   Nurse Communication          Time: [phone] OT Time Calculation (min): 19 min  Charges: OT General Charges $OT Visit: 1 Visit OT Treatments $Self Care/Home Management : 8-22 mins  Sonia Baller, COTA/L Acute Rehabilitation [phone]   Janice Coffin 08/30/2020, 9:17 AM

## 2020-08-30 NOTE — Progress Notes (Signed)
Patient was discharged home on O2 3L by MD order; discharged instructions  review and give to patient with care notes, IV DIC; skin intact; patient will be escorted to the car by nurse tech via wheelchair.

## 2020-08-30 NOTE — Progress Notes (Signed)
HD#7 Subjective:  Overnight Events: none  Patient reports that he is feeling good today. Reports that his knee pain is a little better. He denies any chest pain, shortness of breath, or palpitations. He reported that he wants to go home. We discussed the discharge planning, discussed that he will need to follow up with his PCP, nephrology, and cardiology. Also discussed that we will be making changes to his medications and will list it on his discharge paperwork.   Objective:  Vital signs in last 24 hours: Vitals:   08/29/20 1250 08/29/20 1317 08/29/20 1936 08/30/20 0351  BP: (!) 117/52 119/66 (!) 126/58 128/72  Pulse: 64 64 72 77  Resp: 10 18 18 18   Temp:  98.2 F (36.8 C) 99 F (37.2 C) 98.2 F (36.8 C)  TempSrc:  Oral Oral Oral  SpO2: 93% 99% 95% 97%  Weight:    78 kg  Height:       Supplemental O2: Nasal Cannula SpO2: 97 % O2 Flow Rate (L/min): 2 L/min FiO2 (%): 32 %   Physical Exam:  Physical Exam Constitutional:      Appearance: He is well-developed.  Cardiovascular:     Rate and Rhythm: Normal rate and regular rhythm.  Musculoskeletal:        General: Normal range of motion.     Comments: Left knee with improvement if redness, no swelling, warm to touch  Neurological:     Mental Status: He is alert.     Filed Weights   08/28/20 0613 08/29/20 0345 08/30/20 0351  Weight: 81.2 kg 79.2 kg 78 kg     Intake/Output Summary (Last 24 hours) at 08/30/2020 0815 Last data filed at 08/29/2020 2100 Gross per 24 hour  Intake 460 ml  Output 1060 ml  Net -600 ml   Net IO Since Admission: -10,420.73 mL [08/30/20 0815]  Pertinent Labs: CBC Latest Ref Rng & Units 08/29/2020 08/28/2020 08/23/2020  WBC 4.0 - 10.5 K/uL 7.2 7.0 7.3  Hemoglobin 13.0 - 17.0 g/dL 11.9(L) 12.0(L) 12.7(L)  Hematocrit 39 - 52 % 37.2(L) 38.4(L) 41.7  Platelets 150 - 400 K/uL 174 166 192    CMP Latest Ref Rng & Units 08/29/2020 08/28/2020 08/27/2020  Glucose 70 - 99 mg/dL 144(H) 133(H) 132(H)    BUN 8 - 23 mg/dL 51(H) 51(H) 55(H)  Creatinine 0.61 - 1.24 mg/dL 2.43(H) 2.43(H) 2.69(H)  Sodium 135 - 145 mmol/L 141 139 143  Potassium 3.5 - 5.1 mmol/L 3.2(L) 3.3(L) 3.7  Chloride 98 - 111 mmol/L 95(L) 93(L) 99  CO2 22 - 32 mmol/L 36(H) 33(H) 33(H)  Calcium 8.9 - 10.3 mg/dL 8.5(L) 9.0 8.9  Total Protein 6.5 - 8.1 g/dL - - -  Total Bilirubin 0.3 - 1.2 mg/dL - - -  Alkaline Phos 38 - 126 U/L - - -  AST 15 - 41 U/L - - -  ALT 0 - 44 U/L - - -   Imaging: ECHO TEE  Result Date: 08/29/2020    TRANSESOPHOGEAL ECHO REPORT   Patient Name:   TIEGAN TERPSTRA Date of Exam: 08/29/2020 Medical Rec #:  [id number]     Height:       69.0 in Accession #:    [id number]    Weight:       174.7 lb Date of Birth:  1932-06-14      BSA:          1.951 m Patient Age:    25 years  BP:           135/65 mmHg Patient Gender: M             HR:           64 bpm. Exam Location:  Inpatient Procedure: Transesophageal Echo Indications:     Atrial Fibrilation; Atrial fibrillation and Flutter  History:         Patient has prior history of Echocardiogram examinations, most                  recent 02/06/2020.  Sonographer:     Philipp Deputy Referring Phys:  [id number] Nadean Corwin HILTY Diagnosing Phys: Lyman Bishop MD PROCEDURE: After discussion of the risks and benefits of a TEE, an informed consent was obtained from the patient. The transesophogeal probe was passed without difficulty through the esophogus of the patient. Local oropharyngeal anesthetic was provided with Cetacaine. Sedation performed by different physician. The patient was monitored while under deep sedation. Anesthestetic sedation was provided intravenously by Anesthesiology: 162mg  of Propofol. Image quality was adequate. The patient developed no complications during the procedure. A successful direct current cardioversion was performed. IMPRESSIONS  1. Left ventricular ejection fraction, by estimation, is 60 to 65%. The left ventricle has normal function. There is moderate  left ventricular hypertrophy.  2. Right ventricular systolic function is moderately reduced. The right ventricular size is mildly enlarged.  3. No left atrial/left atrial appendage thrombus was detected.  4. Right atrial size was mild to moderately dilated.  5. The mitral valve is grossly normal. Trivial mitral valve regurgitation.  6. The aortic valve is tricuspid. Aortic valve regurgitation is not visualized.  7. There is mild (Grade II) plaque. Conclusion(s)/Recommendation(s): No LA/LAA thrombus identified. Successful cardioversion performed with restoration of normal sinus rhythm. FINDINGS  Left Ventricle: Left ventricular ejection fraction, by estimation, is 60 to 65%. The left ventricle has normal function. The left ventricular internal cavity size was normal in size. There is moderate left ventricular hypertrophy. Right Ventricle: The right ventricular size is mildly enlarged. No increase in right ventricular wall thickness. Right ventricular systolic function is moderately reduced. Left Atrium: Left atrial size was normal in size. No left atrial/left atrial appendage thrombus was detected. Right Atrium: Right atrial size was mild to moderately dilated. Pericardium: There is no evidence of pericardial effusion. Mitral Valve: The mitral valve is grossly normal. Trivial mitral valve regurgitation. Tricuspid Valve: The tricuspid valve is grossly normal. Tricuspid valve regurgitation is mild. Aortic Valve: The aortic valve is tricuspid. Aortic valve regurgitation is not visualized. Pulmonic Valve: The pulmonic valve was grossly normal. Pulmonic valve regurgitation is not visualized. Aorta: The aortic root and ascending aorta are structurally normal, with no evidence of dilitation. There is mild (Grade II) plaque. IAS/Shunts: No atrial level shunt detected by color flow Doppler.  TRICUSPID VALVE TR Peak grad:   29.4 mmHg TR Vmax:        271.00 cm/s Lyman Bishop MD Electronically signed by Lyman Bishop MD  Signature Date/Time: 08/29/2020/3:14:37 PM    Final     Assessment/Plan:   Principal Problem:   Acute on chronic right heart failure (HCC) Active Problems:   Acute on chronic renal failure (HCC)   OSA (obstructive sleep apnea)   Diabetes ([place])   Atrial fibrillation [facility])  Patient Summary: [name] is a 86 year old male with PMHx of hypertension, hyperlipidemia, right-sided diastolic heart failure, DM,  obstructive sleep apnea, COPD, CAD s/p CABG, chronic kidney disease, and recent  diagnosis of atrial fibrillation on Eliquis admitted for acute heart failure exacerbation and worsening renal function.  Acute on chronic right heart failure Patient presented with dyspnea in setting of acute on chronic right heart failure exacerbation. He has been diuresing with lasix with-1.2 L UOP over past 24 hours and net -15.5 L since admission. Weight down to 78k from 87 kg. Euvolemic on exam. Patient now feeling much improved and ready to go home. Discharge on lasix 80mg  daily with home health PT.  Acute on chronic renal failure  Patient with Cr down to  2.33this morning. Will continue to hold ACEi at discharge. Will need to establish with nephrology as an outpatient given worsening renal function.  Atrial fibrillation  CHADS2-VASc score 5. Patient noted to have new onset atrial fibrillation at cardiology visit on 9/9 for which he has been started on anticoagulation. Successful cardioversion on 08/29/2020 now in sinus rhythmn. Patient tolerated procedure well. Started on Metoprolol XL 12.5 mg for rate control should atrial fibrillation reoccur. Will continue on  Eliquis 2.5mg  bid and home potassium.   Left knee pain secondary to gout Knee pain improved today. On exam erythema improved no swelling normal ROM.  No history of gout in the past.  Patient with ereythema of left knee without effusion and normal ROM. Uric acid of 16.4.Knee pain likely secondary to gout in the setting of diuresis. Appears  to be resolving without intervention.  Hypertension Will continue holding ACE given worsening renal function. Normotensive during admission. BP in the 110s-20s over 50s-70s in the last 24 hours. Well controlled on clonidine.  Diet: Renal/Carb modified Fluids/Electrolytes: Monitor and replete prn DVT Prophylaxis: Eliquis Code status: FULL   Prior to Admission Living Arrangement: Home Anticipated Discharge Location: Home w/HH Barriers to Discharge: None Dispo: Anticipated discharge home  today   Iona Beard, MD 08/30/2020, 8:15 AM Pager: [phone]  Please contact the on call pager after 5 pm and on weekends at [phone].

## 2020-08-31 NOTE — Discharge Summary (Addendum)
Name: [name] MRN: [id number] DOB: Feb 24, 1932 86 y.o. PCP: [name], MD  Date of Admission: 08/23/2020  1:06 PM Date of Discharge: 08/30/2020 Attending Physician: Lalla Brothers, MD  Discharge Diagnosis: 1. Acute on chronic right heart failure 2. Acute on chronic renal failure 3. Atrial fibrillation 4. Diabetes 5. OSA  Discharge Medications: Allergies as of 08/30/2020   No Known Allergies      Medication List     STOP taking these medications    aspirin EC 81 MG tablet   benazepril 40 MG tablet Commonly known as: LOTENSIN   carvedilol 6.25 MG tablet Commonly known as: COREG   nadolol 40 MG tablet Commonly known as: CORGARD       TAKE these medications    acetaminophen 325 MG tablet Commonly known as: TYLENOL Take 650 mg by mouth every 6 (six) hours as needed (for pain.).   apixaban 2.5 MG Tabs tablet Commonly known as: Eliquis Take 1 tablet (2.5 mg total) by mouth 2 (two) times daily.   cloNIDine 0.1 MG tablet Commonly known as: CATAPRES Take 0.1 mg by mouth 2 (two) times daily.   ezetimibe 10 MG tablet Commonly known as: ZETIA Take 10 mg by mouth daily.   furosemide 80 MG tablet Commonly known as: LASIX Take 1 tablet (80 mg total) by mouth daily. What changed:  medication strength when to take this   HumaLOG KwikPen 100 UNIT/ML KwikPen Generic drug: insulin lispro Inject 6-8 Units into the skin 3 (three) times daily.   Lantus SoloStar 100 UNIT/ML Solostar Pen Generic drug: insulin glargine Inject 18 Units into the skin every evening.   metoprolol succinate 25 MG 24 hr tablet Commonly known as: TOPROL-XL Take 0.5 tablets (12.5 mg total) by mouth daily.   potassium chloride 10 MEQ tablet Commonly known as: KLOR-CON Take 2 tablets (20 mEq total) by mouth 2 (two) times daily.   rosuvastatin 20 MG tablet Commonly known as: CRESTOR TAKE 1 TABLET BY MOUTH EVERY DAY   Synthroid 100 MCG tablet Generic drug: levothyroxine Take 100  mcg by mouth daily.   Systane Balance 0.6 % Soln Generic drug: Propylene Glycol Place 1 drop into both eyes daily as needed (dry eyes).   Vascepa 1 g capsule Generic drug: icosapent Ethyl Take 2 capsules (2 g total) by mouth 2 (two) times daily.        Disposition and follow-up:   [name] was discharged from [facility] in Stable condition.  At the hospital follow up visit please address:  1.   Acute on chronic heart failure - Please assess for need to adjust diuretic therapy. Patient has pending SPEP with immunofixation from cardiac amyloidosis workup.   Acute on chronic renal failure - Please assess kidney function on home diuretics and potassium levels as is K on discharge was 3.2.  Left knee pain secondary to gout - Please assess for improvement in left knee pain. May assess need for gout medication if pain does not improve.  Atrial fibrillation- Please assess if patient continues to remain in sinus Rhythmn and continued need for metoprolol and anticoagulation.  2.  Labs / imaging needed at time of follow-up: BMP, CBC, EKG  3.  Pending labs/ test needing follow-up: SPEP with immunofixation  Follow-up Appointments:  Follow-up Information     [place], [place] Follow up.   Specialty: Home Health Services Why: [facility], HHPT Contact information: [address] [place] [address] [phone]  [facility], [employer]. Schedule an appointment as soon as possible for a visit.   Specialty: Nephrology Contact information: [address] [place] [address] [phone]         [name], MD. Schedule an appointment as soon as possible for a visit.   Specialty: Internal Medicine Contact information: [address] [place] [address] [phone]         Pixie Casino, MD Follow up.   Specialty: Cardiology Contact information: [place] [address] [place] Hospital Course by problem list: 1. [name] is a 86 year old male with a PMHx significant for HTN, HLD, COPD, OSA, CKD 3b, recent diagnosis of afib (on Eliquis 2.5 mg), CAD s/p CABG, and HFpEF who was found to have acute heart failure exacerbation and currently undergoing diuresis.  Hospital course complicated by hyperkalemia.   Acute HFpEF Patient with EF 60-65% on echo from 07/20/2020. This is patient's 3rd admission for heart failure exacerbation in the last 5 weeks. Exam significant for JVD today jaw, 2+ bilateral pitting edema to the thighs, warm extremities and decreased lung sounds but no crackles on admission. Initially with poor UOP on IV lasix 120 BID and metolazone. Improved after Lasix drip with 2 doses of IV lasix 120 with metolazone. Edema and JVD resolved on diuretics with weight down from 93.3 kg to 78 kg.  Discharged on home lasix 80 mg daily and home health PT. Will need close outpatient follow up to prevent further rehospitalization's.  Acute on chronic renal failure Patient was found to have creatinine of 3.69 and BUN of 56 on admission up from creatinine of 2.07 and BUN of 31 at discharge 1 week ago.  Baseline was 1.7-2 previously. Acute kidney injury secondary to acute heart failure exacerbation. Cr improved with diuresis 2.33. Nephrology consulted for worsening renal function and poor initial response to diuretic therapy. Did not require dialysis during admission, patient expresses wish to pursue dialysis in the future is needed. Will need to establish with nephrology as an outpatient given worsening kidney function.  Atrial fibrillation  CHADS2-VASc score 5. Patient noted to have new onset atrial fibrillation at cardiology visit on 9/9 for which he has been started on eliquis. Successful cardioversion on 08/29/2020 now in sinus rhythmn. Patient tolerated procedure well. Started on Metoprolol XL 12.5 mg for  rate control should atrial fibrillation reoccur HR at discharge of 77 . Will continue on  Eliquis 2.5mg  bid and home potassium.   Hyperkalemia Patient was found to have potassium of 6.0 on admission. Repeat potassium increased to 6.4. EKG showed sinus bradycardia but no peaked T waves, PR prolongation or ST depression. Improved with lokelma to 4.8. Patient then became hypokalemia with potassium of 3.0, potassium repleted and discharge with K of 3.2.  Continued patient is on potassium chloride 20 mEq twice daily at home.  Left knee pain secondary to gout Patient developed left knee pain with redness and mild swelling.  No history of gout in the past. Uric acid of 16.4.Knee pain likely secondary to gout in the setting of diuresis. Appears to be resolving with symptomatic management with acetominophen.   Type 2 DM A1c 6.4, insulin dependent. Place on Lantuss 10 units during admission. Restarted on home medications on discharge.  HTN Patient presented with BP of 134/68. Holding his ACE/ARB in the setting acute kidney injury.  Did not restart his  ACE given worsening renal function. Will need reassessment of BP as an outpatient.   Discharge Vitals:   BP 120/66 (BP Location: Left Arm)    Pulse 62    Temp 97.9 F (36.6 C)    Resp 18    Ht 5\' 9"  (1.753 m)    Wt 78 kg Comment: scale b   SpO2 100%    BMI 25.40 kg/m   Pertinent Labs, Studies, and Procedures:  CLINICAL DATA:  HEART FAILURE. CONCERN FOR CARDIAC AMYLOIDOSIS.   EXAM: NUCLEAR MEDICINE TUMOR LOCALIZATION. PYP CARDIAC AMYLOIDOSIS SCAN WITH SPECT  IMPRESSION: Visual and quantitative assessment (grade 0, H/CLL equal 0.89) are not suggestive of transthyretin amyloidosis.    CLINICAL DATA:  Acute kidney injury   EXAM: RENAL / URINARY TRACT ULTRASOUND COMPLETE   COMPARISON:  None.   FINDINGS: Right Kidney:   Renal measurements: 10.3 x 4.9 x 6.0 cm = volume: 157 mL. Echogenicity within normal limits. Cortical scarring seen in  the upper pole which could be from prior partial nephrectomy. No mass or hydronephrosis visualized.   Left Kidney:   Renal measurements: 8.9 x 4.9 x 5 4 cm = volume: 121 mL. Echogenicity within normal limits. Anechoic cysts are seen within the left kidney the largest measuring 2.6 x 1.9 x 2.2 cm.   Bladder:   Appears normal for degree of bladder distention.   Other:   A small right pleural effusion is present. There is a small amount of perihepatic ascites present.   IMPRESSION: Cortical scarring in the upper pole the right kidney, likely from partial nephrectomy.   Left renal cysts   Small right pleural effusion and small amount of perihepatic ascites.     Electronically Signed   By: Prudencio Pair M.D.   On: 08/23/2020 21:26  CLINICAL DATA:  Short of breath.  History of CHF   EXAM: CHEST - 2 VIEW   COMPARISON:  08/08/2020   FINDINGS: Cardiac enlargement with prior CABG. Pulmonary vascular congestion with mild interval improvement. Mild edema with improvement. Bilateral pleural effusions left greater than right unchanged. Bibasilar atelectasis left greater than right unchanged.   IMPRESSION: Mild improvement in vascular congestion and edema. Findings compatible with heart failure with bilateral effusions left greater than right.     Electronically Signed   By: Franchot Gallo M.D.   On: 08/23/2020 13:57  Discharge Instructions: Discharge Instructions     (HEART FAILURE PATIENTS) Call MD:  Anytime you have any of the following symptoms: 1) 3 pound weight gain in 24 hours or 5 pounds in 1 week 2) shortness of breath, with or without a dry hacking cough 3) swelling in the hands, feet or stomach 4) if you have to sleep on extra pillows at night in order to breathe.   Complete by: As directed    Call MD for:  redness, tenderness, or signs of infection (pain, swelling, redness, odor or green/yellow discharge around incision site)   Complete by: As directed     Call MD for:  temperature >100.4   Complete by: As directed    Diet - low sodium heart healthy   Complete by: As directed    Discharge instructions   Complete by: As directed    You were hospitalized for congestive heart failure and worsening kidney funtion. Thank you for allowing [place] to be part of your care.    Please follow up with the following providers:  Follow-up Information    [facility] Follow up.   Specialty: Home Health Services Why: [facility], HHPT Contact information: [place] [address] [phone]        [facility], [facility]. Schedule an appointment as soon as  possible for a visit.   Specialty: Nephrology Contact information: [address] [place] [address] [phone]        [name], MD. Schedule an appointment as soon as possible for a visit.    Specialty: Internal Medicine Contact information: [address] [place] [address] [phone]        Pixie Casino, MD Follow up.   Specialty: Cardiology Contact information: [place] [address] [phone]               Please note these changes made to your medications:   - Medications to continue: Clonidine 0.1 mg twice daily, ezetimibe 10 mg daily, rosuvastatin 20 mg daily, humalog 6-8 units three times daily, Lantus 18 units daily, syntoid 100 mcg daily, apixaban 2.5 mg twice daily, potassium chloride 20 mEQ twice daily  - Medications to start: Metoprolol succinate 25 mg daily and furosemide 80 mg once daily  - Medications to discontinue:  Aspirin, benazepril, carvedilol, and nadolol   Please call our clinic if you have any questions or concerns, we may be able to help and keep you from a long and expensive emergency room wait. Our clinic and after hours phone number is [phone], the best time to call is Monday through Friday 9 am to 4 pm  but there is always someone available 24/7 if you have an emergency. If you need medication refills please notify your pharmacy one week in advance and they will send [place] a request.   Increase activity slowly   Complete by: As directed        Signed: Iona Beard, MD 08/31/2020, 3:22 PM   Pager: [phone]

## 2020-09-03 ENCOUNTER — Other Ambulatory Visit: Payer: Self-pay | Admitting: *Deleted

## 2020-09-03 DIAGNOSIS — N1832 Chronic kidney disease, stage 3b: Secondary | ICD-10-CM | POA: Diagnosis not present

## 2020-09-03 DIAGNOSIS — T8189XD Other complications of procedures, not elsewhere classified, subsequent encounter: Secondary | ICD-10-CM | POA: Diagnosis not present

## 2020-09-03 DIAGNOSIS — E1122 Type 2 diabetes mellitus with diabetic chronic kidney disease: Secondary | ICD-10-CM | POA: Diagnosis not present

## 2020-09-03 DIAGNOSIS — J449 Chronic obstructive pulmonary disease, unspecified: Secondary | ICD-10-CM | POA: Diagnosis not present

## 2020-09-03 DIAGNOSIS — E785 Hyperlipidemia, unspecified: Secondary | ICD-10-CM | POA: Diagnosis not present

## 2020-09-03 DIAGNOSIS — I872 Venous insufficiency (chronic) (peripheral): Secondary | ICD-10-CM | POA: Diagnosis not present

## 2020-09-03 DIAGNOSIS — I2510 Atherosclerotic heart disease of native coronary artery without angina pectoris: Secondary | ICD-10-CM | POA: Diagnosis not present

## 2020-09-03 DIAGNOSIS — I4891 Unspecified atrial fibrillation: Secondary | ICD-10-CM | POA: Diagnosis not present

## 2020-09-03 DIAGNOSIS — L97829 Non-pressure chronic ulcer of other part of left lower leg with unspecified severity: Secondary | ICD-10-CM | POA: Diagnosis not present

## 2020-09-03 DIAGNOSIS — Z951 Presence of aortocoronary bypass graft: Secondary | ICD-10-CM | POA: Diagnosis not present

## 2020-09-03 DIAGNOSIS — I50813 Acute on chronic right heart failure: Secondary | ICD-10-CM | POA: Diagnosis not present

## 2020-09-03 DIAGNOSIS — Z9981 Dependence on supplemental oxygen: Secondary | ICD-10-CM | POA: Diagnosis not present

## 2020-09-03 DIAGNOSIS — Z794 Long term (current) use of insulin: Secondary | ICD-10-CM | POA: Diagnosis not present

## 2020-09-03 DIAGNOSIS — E875 Hyperkalemia: Secondary | ICD-10-CM | POA: Diagnosis not present

## 2020-09-03 DIAGNOSIS — E039 Hypothyroidism, unspecified: Secondary | ICD-10-CM | POA: Diagnosis not present

## 2020-09-03 DIAGNOSIS — G4733 Obstructive sleep apnea (adult) (pediatric): Secondary | ICD-10-CM | POA: Diagnosis not present

## 2020-09-03 DIAGNOSIS — I130 Hypertensive heart and chronic kidney disease with heart failure and stage 1 through stage 4 chronic kidney disease, or unspecified chronic kidney disease: Secondary | ICD-10-CM | POA: Diagnosis not present

## 2020-09-03 DIAGNOSIS — N179 Acute kidney failure, unspecified: Secondary | ICD-10-CM | POA: Diagnosis not present

## 2020-09-03 DIAGNOSIS — Z7901 Long term (current) use of anticoagulants: Secondary | ICD-10-CM | POA: Diagnosis not present

## 2020-09-03 DIAGNOSIS — E1151 Type 2 diabetes mellitus with diabetic peripheral angiopathy without gangrene: Secondary | ICD-10-CM | POA: Diagnosis not present

## 2020-09-03 DIAGNOSIS — I5033 Acute on chronic diastolic (congestive) heart failure: Secondary | ICD-10-CM | POA: Diagnosis not present

## 2020-09-03 DIAGNOSIS — Z4800 Encounter for change or removal of nonsurgical wound dressing: Secondary | ICD-10-CM | POA: Diagnosis not present

## 2020-09-03 DIAGNOSIS — Z87891 Personal history of nicotine dependence: Secondary | ICD-10-CM | POA: Diagnosis not present

## 2020-09-03 NOTE — Patient Outreach (Signed)
[facility]) Care Management  09/03/2020  [name] May 18, 1932 [id number]   Subjective: Per patient's previous verbal consent, gave verbal consent to speak with wife Ora Mcnatt) regarding healthcare needs, as needed.  Telephone call to patient's home number, spoke with patient's wife [name]), stated patient's name, date of birth, and address.  Wife states this is not a good time to talk  and requested call back at a later time.     Objective: Per KPN (Knowledge Performance Now, point of care tool) and chart review, patient hospitalized 9/17/201 - 08/30/2020 for Acute on chronic right heart failure , Acute on chronic renal failure,  Atrial fibrillation.   Patient hospitalized 08/08/2020 - 08/14/2020 for acute exacerbation of congestive heart failure, Venous Stasis Dermatitis with Ulceration.    Patient hospitalized 8/13/201 - 07/24/2020 for Acute exacerbation of Heart Failure with preserved Ejection Fraction.    Patient also has a history of diabetes, hypertension, CAD with CAGB in  1996, chronic kidney disease Stage 3, OSA, gout, and on home oxygen.      Assessment:  Received Medicare [facility] Liaison referral on 08/15/2020.   Referral source: Natividad Brood.   Referral reason:  Please assign to [place] Coordinator for complex care and        disease management HF exacerbation   ,COPD, home 0xygen, follow up calls        and assess for further needs [ per MD notes patient daughter passed        away 3 days ago, assess for social worker support needs]  Screening follow up completed and will continue to follow up for Congestive heart failure education / monitoring, care coordination.     Plan: RNCM will discuss patient with [place] Management team during  Multidisciplinary Case Discussion (MCD) on 09/05/2020.  RNCM will call patient and / or patient's wife for  2nd telephone outreach attempt within 4  business days, complex  congestive heart failure care coordination follow up, hospitalization follow up, and proceed with case closure, after 4th unsuccessful outreach call.     Bruna Dills H. Annia Friendly, BSN, [place] [facility] [place] Telephonic CM Phone: [phone] Fax: [phone]

## 2020-09-04 DIAGNOSIS — I130 Hypertensive heart and chronic kidney disease with heart failure and stage 1 through stage 4 chronic kidney disease, or unspecified chronic kidney disease: Secondary | ICD-10-CM | POA: Diagnosis not present

## 2020-09-04 DIAGNOSIS — I50813 Acute on chronic right heart failure: Secondary | ICD-10-CM | POA: Diagnosis not present

## 2020-09-04 DIAGNOSIS — E1122 Type 2 diabetes mellitus with diabetic chronic kidney disease: Secondary | ICD-10-CM | POA: Diagnosis not present

## 2020-09-04 DIAGNOSIS — I2510 Atherosclerotic heart disease of native coronary artery without angina pectoris: Secondary | ICD-10-CM | POA: Diagnosis not present

## 2020-09-04 DIAGNOSIS — I5033 Acute on chronic diastolic (congestive) heart failure: Secondary | ICD-10-CM | POA: Diagnosis not present

## 2020-09-04 DIAGNOSIS — N1832 Chronic kidney disease, stage 3b: Secondary | ICD-10-CM | POA: Diagnosis not present

## 2020-09-05 ENCOUNTER — Other Ambulatory Visit: Payer: Self-pay | Admitting: *Deleted

## 2020-09-05 NOTE — Patient Outreach (Signed)
[facility]) Care Management  09/05/2020  [name] 20-Mar-1932 [id number]      Patient's case discussed at  Multidisciplinary Case Discussion (MCD) with [facility] Care Management Team.  Discussion recommendations: RNCM will follow up with patient's wife for status update and determine next steps.  Plan: Update team as needed. RNCM will call patientand / or patient's wifefor 2nd telephone outreach attempt within 4business days, complex congestive heart failurecare coordination follow up, hospitalization follow up, and proceed with case closure, after 4th unsuccessful outreach call.    Joscelin Fray H. Annia Friendly, BSN, [place] Management [facility] Telephonic CM Phone: [phone] Fax: [phone]

## 2020-09-06 DIAGNOSIS — N1832 Chronic kidney disease, stage 3b: Secondary | ICD-10-CM | POA: Diagnosis not present

## 2020-09-06 DIAGNOSIS — E1122 Type 2 diabetes mellitus with diabetic chronic kidney disease: Secondary | ICD-10-CM | POA: Diagnosis not present

## 2020-09-06 DIAGNOSIS — I50813 Acute on chronic right heart failure: Secondary | ICD-10-CM | POA: Diagnosis not present

## 2020-09-06 DIAGNOSIS — I130 Hypertensive heart and chronic kidney disease with heart failure and stage 1 through stage 4 chronic kidney disease, or unspecified chronic kidney disease: Secondary | ICD-10-CM | POA: Diagnosis not present

## 2020-09-06 DIAGNOSIS — I2510 Atherosclerotic heart disease of native coronary artery without angina pectoris: Secondary | ICD-10-CM | POA: Diagnosis not present

## 2020-09-06 DIAGNOSIS — I5033 Acute on chronic diastolic (congestive) heart failure: Secondary | ICD-10-CM | POA: Diagnosis not present

## 2020-09-09 ENCOUNTER — Other Ambulatory Visit: Payer: Self-pay | Admitting: *Deleted

## 2020-09-09 NOTE — Patient Outreach (Signed)
[facility]) Care Management  09/09/2020  [name] Jan 18, 1932 [id number]   Subjective:  Per patient's previous verbal consent, gave verbal consent to speak with wife [name]) regarding healthcare needs, as needed.   Telephone call to patient's home  number, no answer, left HIPAA compliant voicemail message for patient's wife [name]), and requested call back.    Objective:Per KPN (Knowledge Performance Now, point of care tool) and chart review, patient hospitalized 9/17/201 - 08/30/2020 for Acute on chronic right heart failure , Acute on chronic renal failure,  Atrial fibrillation.   Patient hospitalized 08/08/2020 - 08/14/2020 for acute exacerbation of congestive heart failure, Venous Stasis Dermatitis with Ulceration.    Patient hospitalized 8/13/201 - 07/24/2020 for Acute exacerbation of Heart Failure with preserved Ejection Fraction.    Patient also has a history of diabetes, hypertension, CAD with CAGB in  1996, chronic kidney disease Stage 3, OSA, gout, and on home oxygen.      Assessment:Received Medicare [facility] Liaison referral on 08/15/2020. Referral source: [name]. Referral reason:  Please assign to [name] Coordinator for complex care and        disease management HF exacerbation  ,COPD, home 0xygen, follow up calls        and assess for further needs [ per MD notes patient daughter passed        away 3 days ago, [name], care coordination.    Plan:RNCM will send unsuccessful outreach letter, [facility] pamphlet, and  will call patientand / or patient's wifefor 3rd telephone outreach attempt within 4business days, complex congestive heart failurecare coordination follow up, hospitalization follow up, and proceed with case closure, after 4th  unsuccessful outreach call.     [name] H. Annia Friendly, BSN, [place] Management [facility] Telephonic CM Phone: [phone] Fax: [phone]

## 2020-09-10 ENCOUNTER — Other Ambulatory Visit: Payer: Self-pay | Admitting: Student

## 2020-09-10 ENCOUNTER — Ambulatory Visit: Payer: Self-pay | Admitting: *Deleted

## 2020-09-10 DIAGNOSIS — I5033 Acute on chronic diastolic (congestive) heart failure: Secondary | ICD-10-CM | POA: Diagnosis not present

## 2020-09-10 DIAGNOSIS — I130 Hypertensive heart and chronic kidney disease with heart failure and stage 1 through stage 4 chronic kidney disease, or unspecified chronic kidney disease: Secondary | ICD-10-CM | POA: Diagnosis not present

## 2020-09-10 DIAGNOSIS — E1122 Type 2 diabetes mellitus with diabetic chronic kidney disease: Secondary | ICD-10-CM | POA: Diagnosis not present

## 2020-09-10 DIAGNOSIS — E1165 Type 2 diabetes mellitus with hyperglycemia: Secondary | ICD-10-CM | POA: Diagnosis not present

## 2020-09-10 DIAGNOSIS — I50813 Acute on chronic right heart failure: Secondary | ICD-10-CM | POA: Diagnosis not present

## 2020-09-10 DIAGNOSIS — E039 Hypothyroidism, unspecified: Secondary | ICD-10-CM | POA: Diagnosis not present

## 2020-09-10 DIAGNOSIS — N1832 Chronic kidney disease, stage 3b: Secondary | ICD-10-CM | POA: Diagnosis not present

## 2020-09-10 DIAGNOSIS — I2510 Atherosclerotic heart disease of native coronary artery without angina pectoris: Secondary | ICD-10-CM | POA: Diagnosis not present

## 2020-09-13 ENCOUNTER — Other Ambulatory Visit: Payer: Self-pay | Admitting: *Deleted

## 2020-09-13 ENCOUNTER — Telehealth: Payer: Self-pay | Admitting: Internal Medicine

## 2020-09-13 DIAGNOSIS — E1122 Type 2 diabetes mellitus with diabetic chronic kidney disease: Secondary | ICD-10-CM | POA: Diagnosis not present

## 2020-09-13 DIAGNOSIS — N1832 Chronic kidney disease, stage 3b: Secondary | ICD-10-CM | POA: Diagnosis not present

## 2020-09-13 DIAGNOSIS — I50813 Acute on chronic right heart failure: Secondary | ICD-10-CM | POA: Diagnosis not present

## 2020-09-13 DIAGNOSIS — I5033 Acute on chronic diastolic (congestive) heart failure: Secondary | ICD-10-CM | POA: Diagnosis not present

## 2020-09-13 DIAGNOSIS — I130 Hypertensive heart and chronic kidney disease with heart failure and stage 1 through stage 4 chronic kidney disease, or unspecified chronic kidney disease: Secondary | ICD-10-CM | POA: Diagnosis not present

## 2020-09-13 DIAGNOSIS — I2510 Atherosclerotic heart disease of native coronary artery without angina pectoris: Secondary | ICD-10-CM | POA: Diagnosis not present

## 2020-09-13 NOTE — Patient Outreach (Signed)
[facility]) Care Management  Industry  09/13/2020   [name] February 16, 1932 [id number]  Subjective: Per patient's previous verbal consent, gave verbal consent to speak with wife [name]) regarding healthcare needs, as needed.   Telephone call to patient's home  number, spoke with patient's wife [name]), stated patient's name, date of birth, and address.   States patient is doing ok, had a follow up appointment with primary MD on 09/03/2020, and appointment went alright.   States patient has a follow up appointment with cardiologist on 09/20/2020.  States patient is weighing daily and she is not aware of the readings, she will encourage patient to record readings, and continue to monitor as needed.   Wife states she and patient are aware of weight parameter and required follow up.  Discussed signs/ symptoms to report, how to reach provider if needed after hours, when to go to ED, and / or call 911.  Discussed discharge instructions to do list from 08/30/2020 hospitalization, wife voices understanding, given contact number for nephrologist office [phone]), and states she will call today to make a follow up appointment.  Wife will contact this RNCM if assistance needed with making follow up appointment or other questions arise.   States patient is receiving home health services from [place], receiving congestive heart failure education booklet, and has no questions at this time.   Wife states patient drives himself to his appointment and wife will accompany at times.    Discuss possible Remote Health referral, wife voices understanding, declines referral at this time, and states she will notify RNCM if referral needed in the future.   RNCM advised wife, moving to another position after 09/19/2020, patient will be transitioned to another [facility] in the future.  Wife in agreement to transition, states she has this RNCM's and [place] Management contact information, will  call if assistance needed prior to next patent outreach.    Wife states patient does not have any transportation, Data processing manager, or pharmacy needs at this time.  States she is very appreciative of the follow up and is in agreement to continue to receive [place] Management information / services on patient's behalf.  Telephone call to [name] at [facility] [phone]), transferred to [facility] (Clinical Manager), HIPAA verified, advised of this RNCM's role, and above discussion with patient's wife.   Advised of RNCM's concern with patient's recent readmission, advised patient at high risk for readmission, and brainstormed with [name] on support strategies to keep patient at home.  [name] states patient is receiving home health nursing and physical therapy, today is next scheduled home visit for both services.   States patient refused physical therapy earlier this week and hopes patient will be in agreement with today's visit.  [name] states patient is doing well clinically stable vital signs,  weight ranges 170 - 172 lbs, blood pressure range 130/140 -70/78, pulse  57 - 60, and receiving congestive heart failure education at each visit.   [name] appreciative of the updates, will put a note in patient's chart, and advise patient's home health nurse to spend additional education time with patient and patient's wife as needed.   RNCM advised [name], moving to another position after 09/19/2020, patient will be transitioned to another [facility] in the future.  RNCM provided contact name and number: [phone] and advised [name] to contact RNCM if needed prior to 09/19/2020.   RNCM provided [place] Management main office number [phone] and [name] voices  understanding, will contact main office, if assistance needed after 09/19/2020.      Objective: Per KPN (Knowledge Performance Now, point of care tool) and chart review, patient hospitalized 9/17/201- 9/24/2021for Acute on chronic right heart  failure ,Acute on chronic renal failure,Atrial fibrillation.Patient hospitalized 08/08/2020 - 08/14/2020 for acute exacerbation of congestive heart failure, Venous Stasis Dermatitis with Ulceration. Patient hospitalized 8/13/201 - 07/24/2020 for Acute exacerbation of Heart Failure with preserved Ejection Fraction. Patient also has a history of diabetes, hypertension, CAD with CAGB in 1996, chronic kidney disease Stage 3, OSA, gout,and on home oxygen.   Encounter Medications:  Outpatient Encounter Medications as of 09/13/2020  Medication Sig Note  . acetaminophen (TYLENOL) 325 MG tablet Take 650 mg by mouth every 6 (six) hours as needed (for pain.).   Marland Kitchen apixaban (ELIQUIS) 2.5 MG TABS tablet Take 1 tablet (2.5 mg total) by mouth 2 (two) times daily. 08/24/2020: Not taking yet  . cloNIDine (CATAPRES) 0.1 MG tablet Take 0.1 mg by mouth 2 (two) times daily.   Marland Kitchen ezetimibe (ZETIA) 10 MG tablet Take 10 mg by mouth daily.   . furosemide (LASIX) 80 MG tablet Take 1 tablet (80 mg total) by mouth daily.   Marland Kitchen HUMALOG KWIKPEN 100 UNIT/ML KiwkPen Inject 6-8 Units into the skin 3 (three) times daily.    [name] Ethyl (VASCEPA) 1 g CAPS Take 2 capsules (2 g total) by mouth 2 (two) times daily.   Marland Kitchen LANTUS SOLOSTAR 100 UNIT/ML Solostar Pen Inject 18 Units into the skin every evening.    . metoprolol succinate (TOPROL-XL) 25 MG 24 hr tablet Take 0.5 tablets (12.5 mg total) by mouth daily.   . potassium chloride (KLOR-CON) 10 MEQ tablet Take 2 tablets (20 mEq total) by mouth 2 (two) times daily.   Marland Kitchen Propylene Glycol (SYSTANE BALANCE) 0.6 % SOLN Place 1 drop into both eyes daily as needed (dry eyes).   . rosuvastatin (CRESTOR) 20 MG tablet TAKE 1 TABLET BY MOUTH EVERY DAY   . SYNTHROID 100 MCG tablet Take 100 mcg by mouth daily.     No facility-administered encounter medications on file as of 09/13/2020.    Functional Status:  In your present state of health, do you have any difficulty performing the  following activities: 08/24/2020 08/24/2020  Hearing? - Y  Vision? - N  Difficulty concentrating or making decisions? - N  Walking or climbing stairs? - Y  Dressing or bathing? - Y  Doing errands, shopping? N -  Some recent data might be hidden    Fall/Depression Screening: Fall Risk  08/19/2020  Falls in the past year? 0  Number falls in past yr: 0  Injury with Fall? 0  Risk for fall due to : Impaired mobility  Follow up Falls evaluation completed;Education provided   [facility] 2/9 Scores 08/19/2020  Exception Documentation Other- indicate reason in comment box  Not completed Unable ask patient requested RNCM complete assessment with wife.    Assessment: Received Medicare [facility] Liaison referral on 08/15/2020. Referral source: [name]. Referral reason:  Please assign to [place] Coordinator for complex care and        disease management HF exacerbation  ,COPD, home 0xygen, follow up calls        and assess for further needs [ per MD notes patient daughter passed        away 3 days ago, assess for social worker support needs]  Screening follow up completed andwill continue to follow up  forCongestive heart failure education / monitoring, care coordination.   Goals Addressed              This Visit's Progress   .  Patient Stated (wife) wants patient to stay out of hospital and at home. (pt-stated)   Not on track     [place] (see longitudinal plan of care for additional care plan information)   Current Barriers:  Marland Kitchen Knowledge deficit related to basic heart failure pathophysiology and self care management  Case Manager Clinical Goal(s):  Marland Kitchen Over the next 60 days, patient will verbalize understanding of Heart Failure Action Plan and when to call doctor . Patient will verbalize two symptoms of CHF exacerbation within the next 30 days.    Interventions:  . Provided verbal education on low sodium diet . Assessed need  for readable accurate scales in home . Discussed importance of daily weight and advised patient to weigh and record daily . Reviewed role of diuretics in prevention of fluid overload and management of heart failure . Reviewed 08/30/2020 discharge instructions to do's with wife  Patient Self Care Activities:  . Adheres to low sodium diet . Wife will call nephrologist office to set up follow up appointment  Updated on 09/13/2020, patient with recent hospitalization         Plan: RNCM will send patient [place] Management Calendar and resource letter, per patient's wife request. Newly assigned RNCM will call patient for telephone outreach attempt, within 21 business days, Congestive heart failure education / monitoring, care coordination, and will proceed with case closure, after 4th unsuccessful outreach call .     Chijioke Lasser H. Annia Friendly, BSN, [place] Management [facility] Telephonic CM Phone: [phone] Fax: [phone]

## 2020-09-13 NOTE — Telephone Encounter (Signed)
Spoke to [place] with [facility].Dr.Hilty advised increase Lasix 80 mg twice a day for 3 days only then return to normal dose. Called patient left message on personal voice mail to increase Lasix 80 mg twice a day for 3 days only then return to normal dose.Advised to call back if continues to have weight gain.

## 2020-09-13 NOTE — Telephone Encounter (Signed)
Ok to increase lasix for 3 days to see if weight resolves  Dr Lemmie Evens

## 2020-09-13 NOTE — Telephone Encounter (Signed)
Received a call from [place] with [facility] calling to report patient has gained 8 lbs in the past 3 days.Stated he is sob with exertion Message sent to Dr.Hilty for advice if ok to increase Lasix for 3 days.

## 2020-09-13 NOTE — Telephone Encounter (Signed)
Pt c/o swelling: STAT is pt has developed SOB within 24 hours  1) How much weight have you gained and in what time span? 8 lb in 3 days  2) If swelling, where is the swelling located? Both legs  3) Are you currently taking a fluid pill? Yes   4) Are you currently SOB? Yes   5) Do you have a log of your daily weights (if so, list)? Yes   6) Have you gained 3 pounds in a day or 5 pounds in a week? Yes   7) Have you traveled recently? No   Oxygen level ranges from 86% to 94 % depending on activity level

## 2020-09-15 ENCOUNTER — Encounter: Payer: Self-pay | Admitting: Student

## 2020-09-17 DIAGNOSIS — I10 Essential (primary) hypertension: Secondary | ICD-10-CM | POA: Diagnosis not present

## 2020-09-17 DIAGNOSIS — E789 Disorder of lipoprotein metabolism, unspecified: Secondary | ICD-10-CM | POA: Diagnosis not present

## 2020-09-17 DIAGNOSIS — Z6829 Body mass index (BMI) 29.0-29.9, adult: Secondary | ICD-10-CM | POA: Diagnosis not present

## 2020-09-17 DIAGNOSIS — E1165 Type 2 diabetes mellitus with hyperglycemia: Secondary | ICD-10-CM | POA: Diagnosis not present

## 2020-09-17 DIAGNOSIS — E039 Hypothyroidism, unspecified: Secondary | ICD-10-CM | POA: Diagnosis not present

## 2020-09-18 DIAGNOSIS — I50813 Acute on chronic right heart failure: Secondary | ICD-10-CM | POA: Diagnosis not present

## 2020-09-18 DIAGNOSIS — I5033 Acute on chronic diastolic (congestive) heart failure: Secondary | ICD-10-CM | POA: Diagnosis not present

## 2020-09-18 DIAGNOSIS — I2510 Atherosclerotic heart disease of native coronary artery without angina pectoris: Secondary | ICD-10-CM | POA: Diagnosis not present

## 2020-09-18 DIAGNOSIS — I130 Hypertensive heart and chronic kidney disease with heart failure and stage 1 through stage 4 chronic kidney disease, or unspecified chronic kidney disease: Secondary | ICD-10-CM | POA: Diagnosis not present

## 2020-09-18 DIAGNOSIS — N1832 Chronic kidney disease, stage 3b: Secondary | ICD-10-CM | POA: Diagnosis not present

## 2020-09-18 DIAGNOSIS — E1122 Type 2 diabetes mellitus with diabetic chronic kidney disease: Secondary | ICD-10-CM | POA: Diagnosis not present

## 2020-09-20 ENCOUNTER — Encounter: Payer: Self-pay | Admitting: Internal Medicine

## 2020-09-20 ENCOUNTER — Other Ambulatory Visit: Payer: Self-pay

## 2020-09-20 ENCOUNTER — Ambulatory Visit (INDEPENDENT_AMBULATORY_CARE_PROVIDER_SITE_OTHER): Payer: Medicare Other | Admitting: Internal Medicine

## 2020-09-20 VITALS — BP 200/102 | HR 71 | Ht 69.0 in | Wt 190.0 lb

## 2020-09-20 DIAGNOSIS — I5033 Acute on chronic diastolic (congestive) heart failure: Secondary | ICD-10-CM | POA: Diagnosis not present

## 2020-09-20 DIAGNOSIS — N1832 Chronic kidney disease, stage 3b: Secondary | ICD-10-CM | POA: Diagnosis not present

## 2020-09-20 DIAGNOSIS — I130 Hypertensive heart and chronic kidney disease with heart failure and stage 1 through stage 4 chronic kidney disease, or unspecified chronic kidney disease: Secondary | ICD-10-CM | POA: Diagnosis not present

## 2020-09-20 DIAGNOSIS — J439 Emphysema, unspecified: Secondary | ICD-10-CM

## 2020-09-20 DIAGNOSIS — I4891 Unspecified atrial fibrillation: Secondary | ICD-10-CM

## 2020-09-20 DIAGNOSIS — I2510 Atherosclerotic heart disease of native coronary artery without angina pectoris: Secondary | ICD-10-CM | POA: Diagnosis not present

## 2020-09-20 DIAGNOSIS — I10 Essential (primary) hypertension: Secondary | ICD-10-CM

## 2020-09-20 DIAGNOSIS — E1122 Type 2 diabetes mellitus with diabetic chronic kidney disease: Secondary | ICD-10-CM | POA: Diagnosis not present

## 2020-09-20 DIAGNOSIS — I50813 Acute on chronic right heart failure: Secondary | ICD-10-CM | POA: Diagnosis not present

## 2020-09-20 MED ORDER — FUROSEMIDE 80 MG PO TABS: 80.0000 mg | ORAL_TABLET | Freq: Two times a day (BID) | ORAL | 5 refills | Status: AC

## 2020-09-20 NOTE — Progress Notes (Signed)
OFFICE NOTE  Chief Complaint:  Follow-up hospitalization  Primary Care Physician: [name], MD  HPI:  [name] is a 86 y.o. male with a past medial history significant for chronic diastolic heart failure and bilateral pleural effusions.  LVEF is remained stable around 55 to 60%.  He also has a history of mixed pulmonary venous and arterial hypertension and has been seen in pulmonary by Dr. Lake Bells.  He had a moderate sized right pleural effusion which resolved after more aggressive diuresis.  He also has a history of coronary disease and had CABG in 1996.  In addition he had chronic kidney disease however this is improved with creatinine now ranging between 1.5-1.7.  There is a history of hypertension, dyslipidemia, right bundle branch block and obstructive sleep apnea, which he says he is compliant on nightly CPAP.  When I last saw him 6 months ago his weight is unchanged from then.  He told me that he has no new complaints.  He denies chest pain, worsening shortness of breath or other associated symptoms.  He was mowing his lawn today.  He says he can mow his lawn for at least a half an hour.  He says he typically breaks it up in half because of the heat, but does not have any worsening shortness of breath or heart failure symptoms with this.  09/06/2019  [name] returns today for follow-up of a recent telehealth visit in April 2020.  At that time he had stable chronic diastolic heart failure.  In June 2020 he was seen by Kerin Ransom, PA-C in the office, at the time of seem to have some increased swelling and weight gain.  His Lasix was increased to 80 mg twice daily from 60 mg twice daily and he reports some improvement of his symptoms.  Since then his weight has been fairly stable.  He was seen again by pulmonary in July and reports improved symptoms from his diuretic.  Today he says he is doing well.  Denies any chest pain or worsening shortness of breath.  Weight has been fairly stable.   Recent labs show total cholesterol 113, triglycerides 254, HDL 32 and LDL 30 with hemoglobin A1c of 7.6.  EKG shows a junctional rhythm at 56 with lateral ST and T wave changes.  08/15/2020  [name] is seen today for annual follow-up, however he was just admitted and discharged from the hospital yesterday with acute exacerbation of diastolic heart failure and acute kidney injury.  He had reported increasing lower extremity edema and shortness of breath and was admitted on August 08, 2020 for volume overload.  He was subsequently diuresed down to 86 kg and transition back to home diuretics.  He was able to ambulate requiring 4 L of oxygen.  He was noted to be bradycardic with heart rates in the 40s although thought to be asymptomatic and his beta-blockers were not adjusted.  Today an EKG was performed which shows new onset atrial fibrillation and a heart rate of 60.  He is unaware of this.  This is not a known diagnosis for him.  Overall he still reports his breathing is much better.  09/20/2020  [name] is seen today for hospital follow-up.  He was hospitalized with acute diastolic heart failure.  He was diuresed and I was involved in his care.  I performed a TEE on him which showed an EF of 60 to 65% with moderate RV systolic dysfunction.  No evidence of left  atrial appendage thrombus.  The right atrium was mild to moderately dilated.  He was successfully cardioverted and maintained sinus rhythm.  According to nephrology, he was transitioned from a Lasix drip to a recommended 80 mg Lasix twice daily.  Unfortunately he was only diagnosed on 80 mg Lasix daily.  He reports he was taking Lasix only on a daily basis but noticed his urination was less than he had previously urinated.  We received a call from the home health nurse who noted that he had gained significant weight recently.  I increased his Lasix and he is here today for follow-up.  His discharge weight was 171 pounds and currently he weighs 190  pounds.  Blood pressure is poorly controlled today at 200/102.  I rechecked his numbers and he is 190/100.  He said he had not taken his medicines yet today.  He is currently on 3 L oxygen nasal cannula.  PMHx:  Past Medical History:  Diagnosis Date  . Chronic kidney disease    tumor right kidney  . Coronary artery disease   . Diabetes mellitus    type 2  . Hypertension   . Myocardial infarction ([place])    1997  . Right renal mass     Past Surgical History:  Procedure Laterality Date  . CARDIAC CATHETERIZATION  1996  . CARDIOVERSION N/A 08/29/2020   Procedure: CARDIOVERSION;  Surgeon: Pixie Casino, MD;  Location: [facility] ENDOSCOPY;  Service: Cardiovascular;  Laterality: N/A;  . CORONARY ARTERY BYPASS GRAFT    . DOPPLER ECHOCARDIOGRAPHY  2011  . HERNIA REPAIR    . IR GENERIC HISTORICAL  12/09/2016   IR RADIOLOGIST EVAL & MGMT 12/09/2016 [name], MD [facility] INTERV RAD  . ROBOTIC ASSITED PARTIAL NEPHRECTOMY Right 02/19/2017   Procedure: XI ROBOTIC ASSITED PARTIAL NEPHRECTOMY;  Surgeon: [name], MD;  Location: [facility] ORS;  Service: Urology;  Laterality: Right;  . TEE WITHOUT CARDIOVERSION N/A 08/29/2020   Procedure: TRANSESOPHAGEAL ECHOCARDIOGRAM (TEE);  Surgeon: Pixie Casino, MD;  Location[facility] ENDOSCOPY;  Service: Cardiovascular;  Laterality: N/A;    FAMHx:  Family History  Problem Relation Age of Onset  . Diabetes Mother   . Diabetes Sister     SOCHx:   reports that he quit smoking about 23 years ago. His smoking use included cigarettes. He has a 13.50 pack-year smoking history. He has never used smokeless tobacco. He reports that he does not drink alcohol and does not use drugs.  ALLERGIES:  No Known Allergies  ROS: Pertinent items noted in HPI and remainder of comprehensive ROS otherwise negative.  HOME MEDS: Current Outpatient Medications on File Prior to Visit  Medication Sig Dispense Refill  . acetaminophen (TYLENOL) 325 MG tablet Take 650 mg by mouth every 6  (six) hours as needed (for pain.).    Marland Kitchen apixaban (ELIQUIS) 2.5 MG TABS tablet Take 1 tablet (2.5 mg total) by mouth 2 (two) times daily. 60 tablet 6  . cloNIDine (CATAPRES) 0.1 MG tablet Take 0.1 mg by mouth 2 (two) times daily.    Marland Kitchen ezetimibe (ZETIA) 10 MG tablet Take 10 mg by mouth daily.    . furosemide (LASIX) 80 MG tablet Take 1 tablet (80 mg total) by mouth daily. 30 tablet 0  . HUMALOG KWIKPEN 100 UNIT/ML KiwkPen Inject 6-8 Units into the skin 3 (three) times daily.   0  . Icosapent Ethyl (VASCEPA) 1 g CAPS Take 2 capsules (2 g total) by mouth 2 (two) times daily. 120 capsule  11  . LANTUS SOLOSTAR 100 UNIT/ML Solostar Pen Inject 18 Units into the skin every evening.     . metoprolol succinate (TOPROL-XL) 25 MG 24 hr tablet Take 0.5 tablets (12.5 mg total) by mouth daily. 30 tablet 0  . potassium chloride (KLOR-CON) 10 MEQ tablet Take 2 tablets (20 mEq total) by mouth 2 (two) times daily. 120 tablet 0  . Propylene Glycol (SYSTANE BALANCE) 0.6 % SOLN Place 1 drop into both eyes daily as needed (dry eyes).    . rosuvastatin (CRESTOR) 20 MG tablet TAKE 1 TABLET BY MOUTH EVERY DAY 90 tablet 1  . SYNTHROID 100 MCG tablet Take 100 mcg by mouth daily.   0   No current facility-administered medications on file prior to visit.    LABS/IMAGING: No results found for this or any previous visit (from the past 48 hour(s)). No results found.  LIPID PANEL:    Component Value Date/Time   CHOL 95 07/21/2020 0646   TRIG 76 07/21/2020 0646   HDL 28 (L) 07/21/2020 0646   CHOLHDL 3.4 07/21/2020 0646   VLDL 15 07/21/2020 0646   LDLCALC 52 07/21/2020 0646     WEIGHTS: Wt Readings from Last 3 Encounters:  09/20/20 190 lb (86.2 kg)  08/30/20 172 lb (78 kg)  08/15/20 192 lb 12.8 oz (87.5 kg)    VITALS: BP (!) 200/102   Pulse 71   Ht 5\' 9"  (1.753 m)   Wt 190 lb (86.2 kg)   SpO2 90%   BMI 28.06 kg/m   EXAM: General appearance: alert, no distress and On nasal cannula oxygen Neck: no carotid  bruit, no JVD and thyroid not enlarged, symmetric, no tenderness/mass/nodules Lungs: diminished breath sounds bibasilar and rales bibasilar Heart: regular rate and rhythm Abdomen: soft, non-tender; bowel sounds normal; no masses,  no organomegaly Extremities: edema 1+ bilateral lower extremity Pulses: 2+ and symmetric Skin: Skin color, texture, turgor normal. No rashes or lesions Neurologic: Grossly normal Psych: Pleasant  EKG: Sinus rhythm with PACs and a fusion beat at 71-personally reviewed  ASSESSMENT: 1. Recent onset atrial fibrillation - CHADSVASC score of 5, s/p DCCV - on Eliquis 2. Recent acute on chronicdiastolic congestive heart failure(EF 60-65%, 07/2020, grade 2 DD, moderate RV dysfunction, severe pulm HTN at 65 mmHg) 3. Mixed pulmonary venous/arterial hypertension 4. Moderate sized pericardial effusion - resolved 5. CAD status post CABG 1996 6. CKD with improving creatinine now 1.54 (as high as 3.84, 2.87, 2.36 recently) 7. Hypertension 8. Dyslipidemia 9. RBBB 10. OSA on CPAP  11.Type 2 diabetes-hemoglobin A1c 7.6  PLAN: 1.   Mr. Pusey was recently hospitalized and diuresed aggressively.  He underwent TEE cardioversion by myself and is maintaining sinus.  At discharge his weight was 171 pounds but has gained up to 190 pounds by our scales today.  Unfortunately he was instructed only to take Lasix 80 mg daily, however he was supposed to be taking 80 mg twice daily according to nephrology but this was incorrectly noted on his discharge paperwork.  He said he was following the discharge paperwork to the letter.  We recently gave him a short increase in his diuretics but I would now advise him to go back up to 80 mg twice daily.  This should help with his edema and we will see him back soon for follow-up.  Pixie Casino, MD, [facility], [place] Director of the Advanced Lipid Disorders &  Cardiovascular Risk Reduction Clinic Diplomate of the  American Board of Clinical Lipidology Attending Cardiologist  Direct Dial: [phone]  Fax: [phone]  Website:  [email or web address] 09/20/2020, 2:51 PM

## 2020-09-20 NOTE — Patient Instructions (Signed)
Medication Instructions:  INCREASE furosemide to 80mg  twice daily  *If you need a refill on your cardiac medications before your next appointment, please call your pharmacy*   Follow-Up: At [facility], you and your health needs are our priority.  As part of our continuing mission to provide you with exceptional heart care, we have created designated Provider Care Teams.  These Care Teams include your primary Cardiologist (physician) and Advanced Practice Providers (APPs -  Physician Assistants and Nurse Practitioners) who all work together to provide you with the care you need, when you need it.  We recommend signing up for the patient portal called "MyChart".  Sign up information is provided on this After Visit Summary.  MyChart is used to connect with patients for Virtual Visits (Telemedicine).  Patients are able to view lab/test results, encounter notes, upcoming appointments, etc.  Non-urgent messages can be sent to your provider as well.   To learn more about what you can do with MyChart, go to [email or web address].    Your next appointment:   1 month(s)  The format for your next appointment:   In Person  Provider:   You may see Pixie Casino, MD or one of the following Advanced Practice Providers on your designated Care Team:    Almyra Deforest, PA-C  Fabian Sharp, PA-C or   Roby Lofts, [place]    Other Instructions

## 2020-09-23 DIAGNOSIS — C641 Malignant neoplasm of right kidney, except renal pelvis: Secondary | ICD-10-CM | POA: Diagnosis not present

## 2020-09-24 DIAGNOSIS — E1122 Type 2 diabetes mellitus with diabetic chronic kidney disease: Secondary | ICD-10-CM | POA: Diagnosis not present

## 2020-09-24 DIAGNOSIS — N1832 Chronic kidney disease, stage 3b: Secondary | ICD-10-CM | POA: Diagnosis not present

## 2020-09-24 DIAGNOSIS — I2510 Atherosclerotic heart disease of native coronary artery without angina pectoris: Secondary | ICD-10-CM | POA: Diagnosis not present

## 2020-09-24 DIAGNOSIS — I5033 Acute on chronic diastolic (congestive) heart failure: Secondary | ICD-10-CM | POA: Diagnosis not present

## 2020-09-24 DIAGNOSIS — I130 Hypertensive heart and chronic kidney disease with heart failure and stage 1 through stage 4 chronic kidney disease, or unspecified chronic kidney disease: Secondary | ICD-10-CM | POA: Diagnosis not present

## 2020-09-24 DIAGNOSIS — I50813 Acute on chronic right heart failure: Secondary | ICD-10-CM | POA: Diagnosis not present

## 2020-09-25 ENCOUNTER — Other Ambulatory Visit: Payer: Self-pay | Admitting: Internal Medicine

## 2020-09-25 DIAGNOSIS — I2510 Atherosclerotic heart disease of native coronary artery without angina pectoris: Secondary | ICD-10-CM | POA: Diagnosis not present

## 2020-09-25 DIAGNOSIS — I50813 Acute on chronic right heart failure: Secondary | ICD-10-CM | POA: Diagnosis not present

## 2020-09-25 DIAGNOSIS — N1832 Chronic kidney disease, stage 3b: Secondary | ICD-10-CM | POA: Diagnosis not present

## 2020-09-25 DIAGNOSIS — I130 Hypertensive heart and chronic kidney disease with heart failure and stage 1 through stage 4 chronic kidney disease, or unspecified chronic kidney disease: Secondary | ICD-10-CM | POA: Diagnosis not present

## 2020-09-25 DIAGNOSIS — I5033 Acute on chronic diastolic (congestive) heart failure: Secondary | ICD-10-CM | POA: Diagnosis not present

## 2020-09-25 DIAGNOSIS — E1122 Type 2 diabetes mellitus with diabetic chronic kidney disease: Secondary | ICD-10-CM | POA: Diagnosis not present

## 2020-09-26 ENCOUNTER — Other Ambulatory Visit: Payer: Self-pay | Admitting: Student

## 2020-09-26 ENCOUNTER — Ambulatory Visit: Payer: Self-pay | Admitting: *Deleted

## 2020-09-26 DIAGNOSIS — I2510 Atherosclerotic heart disease of native coronary artery without angina pectoris: Secondary | ICD-10-CM | POA: Diagnosis not present

## 2020-09-26 DIAGNOSIS — I130 Hypertensive heart and chronic kidney disease with heart failure and stage 1 through stage 4 chronic kidney disease, or unspecified chronic kidney disease: Secondary | ICD-10-CM | POA: Diagnosis not present

## 2020-09-26 DIAGNOSIS — N1832 Chronic kidney disease, stage 3b: Secondary | ICD-10-CM | POA: Diagnosis not present

## 2020-09-26 DIAGNOSIS — I5033 Acute on chronic diastolic (congestive) heart failure: Secondary | ICD-10-CM | POA: Diagnosis not present

## 2020-09-26 DIAGNOSIS — E1122 Type 2 diabetes mellitus with diabetic chronic kidney disease: Secondary | ICD-10-CM | POA: Diagnosis not present

## 2020-09-26 DIAGNOSIS — I50813 Acute on chronic right heart failure: Secondary | ICD-10-CM | POA: Diagnosis not present

## 2020-09-27 DIAGNOSIS — N1832 Chronic kidney disease, stage 3b: Secondary | ICD-10-CM | POA: Diagnosis not present

## 2020-09-27 DIAGNOSIS — I130 Hypertensive heart and chronic kidney disease with heart failure and stage 1 through stage 4 chronic kidney disease, or unspecified chronic kidney disease: Secondary | ICD-10-CM | POA: Diagnosis not present

## 2020-09-27 DIAGNOSIS — I2510 Atherosclerotic heart disease of native coronary artery without angina pectoris: Secondary | ICD-10-CM | POA: Diagnosis not present

## 2020-09-27 DIAGNOSIS — I5033 Acute on chronic diastolic (congestive) heart failure: Secondary | ICD-10-CM | POA: Diagnosis not present

## 2020-09-27 DIAGNOSIS — I50813 Acute on chronic right heart failure: Secondary | ICD-10-CM | POA: Diagnosis not present

## 2020-09-27 DIAGNOSIS — E1122 Type 2 diabetes mellitus with diabetic chronic kidney disease: Secondary | ICD-10-CM | POA: Diagnosis not present

## 2020-10-01 DIAGNOSIS — E1122 Type 2 diabetes mellitus with diabetic chronic kidney disease: Secondary | ICD-10-CM | POA: Diagnosis not present

## 2020-10-01 DIAGNOSIS — I2510 Atherosclerotic heart disease of native coronary artery without angina pectoris: Secondary | ICD-10-CM | POA: Diagnosis not present

## 2020-10-01 DIAGNOSIS — N1832 Chronic kidney disease, stage 3b: Secondary | ICD-10-CM | POA: Diagnosis not present

## 2020-10-01 DIAGNOSIS — I50813 Acute on chronic right heart failure: Secondary | ICD-10-CM | POA: Diagnosis not present

## 2020-10-01 DIAGNOSIS — I130 Hypertensive heart and chronic kidney disease with heart failure and stage 1 through stage 4 chronic kidney disease, or unspecified chronic kidney disease: Secondary | ICD-10-CM | POA: Diagnosis not present

## 2020-10-01 DIAGNOSIS — I5033 Acute on chronic diastolic (congestive) heart failure: Secondary | ICD-10-CM | POA: Diagnosis not present

## 2020-10-02 DIAGNOSIS — E1122 Type 2 diabetes mellitus with diabetic chronic kidney disease: Secondary | ICD-10-CM | POA: Diagnosis not present

## 2020-10-02 DIAGNOSIS — I5033 Acute on chronic diastolic (congestive) heart failure: Secondary | ICD-10-CM | POA: Diagnosis not present

## 2020-10-02 DIAGNOSIS — I50813 Acute on chronic right heart failure: Secondary | ICD-10-CM | POA: Diagnosis not present

## 2020-10-02 DIAGNOSIS — I130 Hypertensive heart and chronic kidney disease with heart failure and stage 1 through stage 4 chronic kidney disease, or unspecified chronic kidney disease: Secondary | ICD-10-CM | POA: Diagnosis not present

## 2020-10-02 DIAGNOSIS — I2510 Atherosclerotic heart disease of native coronary artery without angina pectoris: Secondary | ICD-10-CM | POA: Diagnosis not present

## 2020-10-02 DIAGNOSIS — N1832 Chronic kidney disease, stage 3b: Secondary | ICD-10-CM | POA: Diagnosis not present

## 2020-10-03 ENCOUNTER — Other Ambulatory Visit: Payer: Self-pay | Admitting: Student

## 2020-10-03 DIAGNOSIS — I130 Hypertensive heart and chronic kidney disease with heart failure and stage 1 through stage 4 chronic kidney disease, or unspecified chronic kidney disease: Secondary | ICD-10-CM | POA: Diagnosis not present

## 2020-10-03 DIAGNOSIS — Z4800 Encounter for change or removal of nonsurgical wound dressing: Secondary | ICD-10-CM | POA: Diagnosis not present

## 2020-10-03 DIAGNOSIS — I5033 Acute on chronic diastolic (congestive) heart failure: Secondary | ICD-10-CM | POA: Diagnosis not present

## 2020-10-03 DIAGNOSIS — E1151 Type 2 diabetes mellitus with diabetic peripheral angiopathy without gangrene: Secondary | ICD-10-CM | POA: Diagnosis not present

## 2020-10-03 DIAGNOSIS — Z87891 Personal history of nicotine dependence: Secondary | ICD-10-CM | POA: Diagnosis not present

## 2020-10-03 DIAGNOSIS — I2510 Atherosclerotic heart disease of native coronary artery without angina pectoris: Secondary | ICD-10-CM | POA: Diagnosis not present

## 2020-10-03 DIAGNOSIS — I4891 Unspecified atrial fibrillation: Secondary | ICD-10-CM | POA: Diagnosis not present

## 2020-10-03 DIAGNOSIS — E1122 Type 2 diabetes mellitus with diabetic chronic kidney disease: Secondary | ICD-10-CM | POA: Diagnosis not present

## 2020-10-03 DIAGNOSIS — Z951 Presence of aortocoronary bypass graft: Secondary | ICD-10-CM | POA: Diagnosis not present

## 2020-10-03 DIAGNOSIS — Z794 Long term (current) use of insulin: Secondary | ICD-10-CM | POA: Diagnosis not present

## 2020-10-03 DIAGNOSIS — T8189XD Other complications of procedures, not elsewhere classified, subsequent encounter: Secondary | ICD-10-CM | POA: Diagnosis not present

## 2020-10-03 DIAGNOSIS — Z9981 Dependence on supplemental oxygen: Secondary | ICD-10-CM | POA: Diagnosis not present

## 2020-10-03 DIAGNOSIS — L97829 Non-pressure chronic ulcer of other part of left lower leg with unspecified severity: Secondary | ICD-10-CM | POA: Diagnosis not present

## 2020-10-03 DIAGNOSIS — N1832 Chronic kidney disease, stage 3b: Secondary | ICD-10-CM | POA: Diagnosis not present

## 2020-10-03 DIAGNOSIS — E875 Hyperkalemia: Secondary | ICD-10-CM | POA: Diagnosis not present

## 2020-10-03 DIAGNOSIS — I50813 Acute on chronic right heart failure: Secondary | ICD-10-CM | POA: Diagnosis not present

## 2020-10-03 DIAGNOSIS — N179 Acute kidney failure, unspecified: Secondary | ICD-10-CM | POA: Diagnosis not present

## 2020-10-03 DIAGNOSIS — G4733 Obstructive sleep apnea (adult) (pediatric): Secondary | ICD-10-CM | POA: Diagnosis not present

## 2020-10-03 DIAGNOSIS — Z7901 Long term (current) use of anticoagulants: Secondary | ICD-10-CM | POA: Diagnosis not present

## 2020-10-03 DIAGNOSIS — J449 Chronic obstructive pulmonary disease, unspecified: Secondary | ICD-10-CM | POA: Diagnosis not present

## 2020-10-03 DIAGNOSIS — E785 Hyperlipidemia, unspecified: Secondary | ICD-10-CM | POA: Diagnosis not present

## 2020-10-03 DIAGNOSIS — I872 Venous insufficiency (chronic) (peripheral): Secondary | ICD-10-CM | POA: Diagnosis not present

## 2020-10-03 DIAGNOSIS — E039 Hypothyroidism, unspecified: Secondary | ICD-10-CM | POA: Diagnosis not present

## 2020-10-04 DIAGNOSIS — I5033 Acute on chronic diastolic (congestive) heart failure: Secondary | ICD-10-CM | POA: Diagnosis not present

## 2020-10-04 DIAGNOSIS — I130 Hypertensive heart and chronic kidney disease with heart failure and stage 1 through stage 4 chronic kidney disease, or unspecified chronic kidney disease: Secondary | ICD-10-CM | POA: Diagnosis not present

## 2020-10-04 DIAGNOSIS — I2510 Atherosclerotic heart disease of native coronary artery without angina pectoris: Secondary | ICD-10-CM | POA: Diagnosis not present

## 2020-10-04 DIAGNOSIS — E1122 Type 2 diabetes mellitus with diabetic chronic kidney disease: Secondary | ICD-10-CM | POA: Diagnosis not present

## 2020-10-04 DIAGNOSIS — N1832 Chronic kidney disease, stage 3b: Secondary | ICD-10-CM | POA: Diagnosis not present

## 2020-10-04 DIAGNOSIS — I50813 Acute on chronic right heart failure: Secondary | ICD-10-CM | POA: Diagnosis not present

## 2020-10-07 ENCOUNTER — Other Ambulatory Visit: Payer: Self-pay | Admitting: Student

## 2020-10-08 DIAGNOSIS — N1832 Chronic kidney disease, stage 3b: Secondary | ICD-10-CM | POA: Diagnosis not present

## 2020-10-08 DIAGNOSIS — I130 Hypertensive heart and chronic kidney disease with heart failure and stage 1 through stage 4 chronic kidney disease, or unspecified chronic kidney disease: Secondary | ICD-10-CM | POA: Diagnosis not present

## 2020-10-08 DIAGNOSIS — E1122 Type 2 diabetes mellitus with diabetic chronic kidney disease: Secondary | ICD-10-CM | POA: Diagnosis not present

## 2020-10-08 DIAGNOSIS — I2510 Atherosclerotic heart disease of native coronary artery without angina pectoris: Secondary | ICD-10-CM | POA: Diagnosis not present

## 2020-10-08 DIAGNOSIS — I5033 Acute on chronic diastolic (congestive) heart failure: Secondary | ICD-10-CM | POA: Diagnosis not present

## 2020-10-08 DIAGNOSIS — I50813 Acute on chronic right heart failure: Secondary | ICD-10-CM | POA: Diagnosis not present

## 2020-10-10 DIAGNOSIS — I130 Hypertensive heart and chronic kidney disease with heart failure and stage 1 through stage 4 chronic kidney disease, or unspecified chronic kidney disease: Secondary | ICD-10-CM | POA: Diagnosis not present

## 2020-10-10 DIAGNOSIS — I5033 Acute on chronic diastolic (congestive) heart failure: Secondary | ICD-10-CM | POA: Diagnosis not present

## 2020-10-10 DIAGNOSIS — I50813 Acute on chronic right heart failure: Secondary | ICD-10-CM | POA: Diagnosis not present

## 2020-10-10 DIAGNOSIS — I2510 Atherosclerotic heart disease of native coronary artery without angina pectoris: Secondary | ICD-10-CM | POA: Diagnosis not present

## 2020-10-10 DIAGNOSIS — N1832 Chronic kidney disease, stage 3b: Secondary | ICD-10-CM | POA: Diagnosis not present

## 2020-10-10 DIAGNOSIS — E1122 Type 2 diabetes mellitus with diabetic chronic kidney disease: Secondary | ICD-10-CM | POA: Diagnosis not present

## 2020-10-11 DIAGNOSIS — E1122 Type 2 diabetes mellitus with diabetic chronic kidney disease: Secondary | ICD-10-CM | POA: Diagnosis not present

## 2020-10-11 DIAGNOSIS — I50813 Acute on chronic right heart failure: Secondary | ICD-10-CM | POA: Diagnosis not present

## 2020-10-11 DIAGNOSIS — I5033 Acute on chronic diastolic (congestive) heart failure: Secondary | ICD-10-CM | POA: Diagnosis not present

## 2020-10-11 DIAGNOSIS — I130 Hypertensive heart and chronic kidney disease with heart failure and stage 1 through stage 4 chronic kidney disease, or unspecified chronic kidney disease: Secondary | ICD-10-CM | POA: Diagnosis not present

## 2020-10-11 DIAGNOSIS — I2510 Atherosclerotic heart disease of native coronary artery without angina pectoris: Secondary | ICD-10-CM | POA: Diagnosis not present

## 2020-10-11 DIAGNOSIS — N1832 Chronic kidney disease, stage 3b: Secondary | ICD-10-CM | POA: Diagnosis not present

## 2020-10-15 ENCOUNTER — Other Ambulatory Visit: Payer: Self-pay | Admitting: *Deleted

## 2020-10-15 DIAGNOSIS — N1832 Chronic kidney disease, stage 3b: Secondary | ICD-10-CM | POA: Diagnosis not present

## 2020-10-15 DIAGNOSIS — I2510 Atherosclerotic heart disease of native coronary artery without angina pectoris: Secondary | ICD-10-CM | POA: Diagnosis not present

## 2020-10-15 DIAGNOSIS — I5033 Acute on chronic diastolic (congestive) heart failure: Secondary | ICD-10-CM | POA: Diagnosis not present

## 2020-10-15 DIAGNOSIS — E1122 Type 2 diabetes mellitus with diabetic chronic kidney disease: Secondary | ICD-10-CM | POA: Diagnosis not present

## 2020-10-15 DIAGNOSIS — I130 Hypertensive heart and chronic kidney disease with heart failure and stage 1 through stage 4 chronic kidney disease, or unspecified chronic kidney disease: Secondary | ICD-10-CM | POA: Diagnosis not present

## 2020-10-15 DIAGNOSIS — I50813 Acute on chronic right heart failure: Secondary | ICD-10-CM | POA: Diagnosis not present

## 2020-10-15 NOTE — Patient Outreach (Signed)
[facility]) Care Management  10/15/2020  IZSAK MEIR 1932/04/12 [id number]   Telephone Assessment  Reassigned to Care Coordinator on 10/08/20 from prior Case Manager following patient .   Initial Referral received : 08/15/20 Referral source: [place] Referral reason : Complex care and disease management of Heart failure . Insurance: [facility] Admission x 3 in the last 3 months.   Subjective: Unsuccessful outreach call to patient contact number, no answer unable to leave a message as voicemail not setup.    Objective: Per electronic record  patient hospitalized at [facility] patient hospitalized 9/17/201 - 08/30/2020 for Acute on chronic right heart failure , Acute on chronic renal failure,  Atrial fibrillation. PMHX; includes chronic diastolic heart failure, bilateral pleural effusions, chronic kidney disease, Hypertension , type 2 Diabetes, OSA , COPD, Dyslipidemia, pulmonary hypertension.   Plan Will plan return call in the next 4 business days for ongoing complex  care management.   Joylene Draft, RN, BSN  [place] Management Coordinator  [phone]- Mobile [phone]- Toll Free Main Office

## 2020-10-16 ENCOUNTER — Telehealth: Payer: Self-pay | Admitting: Internal Medicine

## 2020-10-16 DIAGNOSIS — I2510 Atherosclerotic heart disease of native coronary artery without angina pectoris: Secondary | ICD-10-CM | POA: Diagnosis not present

## 2020-10-16 DIAGNOSIS — I130 Hypertensive heart and chronic kidney disease with heart failure and stage 1 through stage 4 chronic kidney disease, or unspecified chronic kidney disease: Secondary | ICD-10-CM | POA: Diagnosis not present

## 2020-10-16 DIAGNOSIS — E1122 Type 2 diabetes mellitus with diabetic chronic kidney disease: Secondary | ICD-10-CM | POA: Diagnosis not present

## 2020-10-16 DIAGNOSIS — I50813 Acute on chronic right heart failure: Secondary | ICD-10-CM | POA: Diagnosis not present

## 2020-10-16 DIAGNOSIS — I5033 Acute on chronic diastolic (congestive) heart failure: Secondary | ICD-10-CM | POA: Diagnosis not present

## 2020-10-16 DIAGNOSIS — N1832 Chronic kidney disease, stage 3b: Secondary | ICD-10-CM | POA: Diagnosis not present

## 2020-10-16 NOTE — Telephone Encounter (Signed)
Pt c/o swelling: STAT is pt has developed SOB within 24 hours  1) How much weight have you gained and in what time span? 187lbs 10/15/20, 183lbs 10/11/20  2) If swelling, where is the swelling located? Did not notice increase in swelling   3) Are you currently taking a fluid pill? Yes   4) Are you currently SOB? No   5) Do you have a log of your daily weights (if so, list)? Consistently 183 lbs until 10/15/20  6) Have you gained 3 pounds in a day or 5 pounds in a week? No   7) Have you traveled recently? No

## 2020-10-16 NOTE — Telephone Encounter (Signed)
Second attempt at reaching the patient, unable to leave voicemail as his mailbox is not set up.

## 2020-10-16 NOTE — Telephone Encounter (Signed)
Unable to reach the patient

## 2020-10-16 NOTE — Telephone Encounter (Signed)
I spoke with [name]. His weight did jump yesterday morning, however he has not checked his weight at this morning. He denies any significant shortness of breath. There does appear to be slight increase of leg edema yesterday, this has largely resolved by this point. For some reason, instead of 80 mg tablet of Lasix his pharmacy actually prescribed him 20 mg tablets of Lasix. Therefore he is on 4 tablets twice a day to make up for 80 mg twice daily dosing. He is aware that on the day when he has increased leg edema, he may take extra dose of 20 mg Lasix in the morning and afternoon along with extra dose of potassium supplement in the morning and afternoon as well.   Otherwise, he has been instructed to contact cardiology service if he has further episodes of increased leg swelling.

## 2020-10-17 DIAGNOSIS — N1832 Chronic kidney disease, stage 3b: Secondary | ICD-10-CM | POA: Diagnosis not present

## 2020-10-17 DIAGNOSIS — E1122 Type 2 diabetes mellitus with diabetic chronic kidney disease: Secondary | ICD-10-CM | POA: Diagnosis not present

## 2020-10-17 DIAGNOSIS — I50813 Acute on chronic right heart failure: Secondary | ICD-10-CM | POA: Diagnosis not present

## 2020-10-17 DIAGNOSIS — I130 Hypertensive heart and chronic kidney disease with heart failure and stage 1 through stage 4 chronic kidney disease, or unspecified chronic kidney disease: Secondary | ICD-10-CM | POA: Diagnosis not present

## 2020-10-17 DIAGNOSIS — I2510 Atherosclerotic heart disease of native coronary artery without angina pectoris: Secondary | ICD-10-CM | POA: Diagnosis not present

## 2020-10-17 DIAGNOSIS — I5033 Acute on chronic diastolic (congestive) heart failure: Secondary | ICD-10-CM | POA: Diagnosis not present

## 2020-10-18 ENCOUNTER — Other Ambulatory Visit: Payer: Self-pay | Admitting: *Deleted

## 2020-10-18 NOTE — Patient Outreach (Signed)
[facility]) Care Management  [place]  10/18/2020   HEATHER STREEPER 1932-09-29 [id number]     Telephone Assessment  Reassigned to Care Coordinator on 10/08/20 from prior Case Manager following patient .   Initial Referral received : 08/15/20 Referral source: [place] Referral reason : Complex care and disease management of Heart failure . Insurance: [facility] Admission x 3 in the last 3 months.   Patient PMHX :  patient hospitalized at [facility] patient hospitalized 9/17/201- 9/24/2021for Acute on chronic right heart failure ,Acute on chronic renal failure,Atrial fibrillation. PMHX; includes chronic diastolic heart failure, bilateral pleural effusions, chronic kidney disease, Hypertension , type 2 Diabetes, OSA with CPAP, COPD oxygen,  Dyslipidemia, pulmonary hypertension.   Subjective: Successful outreach call spoke with patient, he reports feeling okay on today, states his breathing is fine, less swelling in his legs.  Patient discussed speaking with someone from Dr. Debara Pickett office this week regarding concern with swelling in legs. He reports taking extra lasix on yesterday and noted improvement in swelling. He reports his weight today is 170,( questioned weight again)  he reports weight 185 lbs on 11/10.  He discussed overall that his breathing is better since discharge from the hospital. Patient reports having home health RN visit about twice a week. Placed call to [place]  with [facility] she discussed patient weight gain ( 187)  earlier this week and she notified cardiology office, stating patient usual weight is 183 lbs, she questions if patient weighed today or just  provided a number.  collaborated regarding support with  heart failure management . Discussed benefits of Remote health and managing chronic conditions in home to help prevent hospital readmission as patient desires, patient declining at this time.  Patient  verifies that he is taking lasix 80 mg twice daily and able to manage his own medications. He reports continuing to work on exercise plan provided by home health PT, he states that he drives himself to appointments, reports occasional use of cane.     Encounter Medications:  Outpatient Encounter Medications as of 10/18/2020  Medication Sig  . acetaminophen (TYLENOL) 325 MG tablet Take 650 mg by mouth every 6 (six) hours as needed (for pain.).  Marland Kitchen apixaban (ELIQUIS) 2.5 MG TABS tablet Take 1 tablet (2.5 mg total) by mouth 2 (two) times daily.  . cloNIDine (CATAPRES) 0.1 MG tablet Take 0.1 mg by mouth 2 (two) times daily.  Marland Kitchen ezetimibe (ZETIA) 10 MG tablet Take 10 mg by mouth daily.  . furosemide (LASIX) 80 MG tablet Take 1 tablet (80 mg total) by mouth 2 (two) times daily.  Marland Kitchen HUMALOG KWIKPEN 100 UNIT/ML KiwkPen Inject 6-8 Units into the skin 3 (three) times daily.   Marland Kitchen icosapent Ethyl (VASCEPA) 1 g capsule TAKE 2 CAPSULES(2 GRAMS) BY MOUTH TWICE DAILY  . LANTUS SOLOSTAR 100 UNIT/ML Solostar Pen Inject 18 Units into the skin every evening.   . metoprolol succinate (TOPROL-XL) 25 MG 24 hr tablet Take 0.5 tablets (12.5 mg total) by mouth daily.  . potassium chloride (KLOR-CON) 10 MEQ tablet Take 2 tablets (20 mEq total) by mouth 2 (two) times daily.  Marland Kitchen Propylene Glycol (SYSTANE BALANCE) 0.6 % SOLN Place 1 drop into both eyes daily as needed (dry eyes).  . rosuvastatin (CRESTOR) 20 MG tablet TAKE 1 TABLET BY MOUTH EVERY DAY  . SYNTHROID 100 MCG tablet Take 100 mcg by mouth daily.    No facility-administered encounter medications on file as  of 10/18/2020.    Functional Status:  In your present state of health, do you have any difficulty performing the following activities: 08/24/2020 08/24/2020  Hearing? - Y  Vision? - N  Difficulty concentrating or making decisions? - N  Walking or climbing stairs? - Y  Dressing or bathing? - Y  Doing errands, shopping? N -  Some recent data might be hidden     Fall/Depression Screening: Fall Risk  08/19/2020  Falls in the past year? 0  Number falls in past yr: 0  Injury with Fall? 0  Risk for fall due to : Impaired mobility  Follow up Falls evaluation completed;Education provided   [facility] 2/9 Scores 08/19/2020  Exception Documentation Other- indicate reason in comment box  Not completed Unable ask patient requested RNCM complete assessment with wife.    Assessment:  Goals Addressed              This Visit's Progress   .  Make and Keep All Appointments        Follow Up Date 12/05/20   - call to cancel if needed - keep a calendar with appointment dates    Why is this important?   Part of staying healthy is seeing the doctor for follow-up care.  If you forget your appointments, there are some things you can do to stay on track.    Notes:     .  COMPLETED: Patient Stated (wife) wants patient to stay out of hospital and at home. (pt-stated)        [place] (see longitudinal plan of care for additional care plan information)   Current Barriers:  Marland Kitchen Knowledge deficit related to basic heart failure pathophysiology and self care management  Case Manager Clinical Goal(s):  Marland Kitchen Over the next 60 days, patient will verbalize understanding of Heart Failure Action Plan and when to call doctor . Patient will verbalize two symptoms of CHF exacerbation within the next 30 days.    Interventions:  . Provided verbal education on low sodium diet . Assessed need for readable accurate scales in home . Discussed importance of daily weight and advised patient to weigh and record daily . Reviewed role of diuretics in prevention of fluid overload and management of heart failure . Reviewed 08/30/2020 discharge instructions to do's with wife  Patient Self Care Activities:  . Adheres to low sodium diet . Wife will call nephrologist office to set up follow up appointment  Updated on 09/13/2020, patient with recent hospitalization      .   Track and Manage Activity and Exertion        Follow Up Date 12/05/20   - follow activity or exercise plan - pace activity allowing for rest    Why is this important?   Exercising is very important when managing your heart failure.  It will help your heart get stronger.    Notes:  Continuing exercises per physical therapy recommendations     .  Track and Manage Fluids and Swelling        Follow Up Date 12/05/20   - keep legs up while sitting - track weight in diary - use salt in moderation - watch for swelling in feet, ankles and legs every day - weigh myself daily    Why is this important?   It is important to check your weight daily and watch how much salt and liquids you have.  It will help you to manage your heart failure.  Notes:     Marland Kitchen  Track and Manage My Blood Pressure        Follow Up Date 12/05/20   - check blood pressure weekly - write blood pressure results in a log or diary    Why is this important?   You won't feel high blood pressure, but it can still hurt your blood vessels.  High blood pressure can cause heart or kidney problems. It can also cause a stroke.  Making lifestyle changes like losing a little weight or eating less salt will help.  Checking your blood pressure at home and at different times of the day can help to control blood pressure.  If the doctor prescribes medicine remember to take it the way the doctor ordered.  Call the office if you cannot afford the medicine or if there are questions about it.     Notes:     .  Track and Manage Symptoms        Follow Up Date 12/05/20    - bring diary to all appointments - follow rescue plan if symptoms flare-up - know when to call the doctor - track symptoms and what helps feel better or worse - dress right for the weather, hot or cold    Why is this important?   You will be able to handle your symptoms better if you keep track of them.  Making some simple changes to your lifestyle will  help.  Eating healthy is one thing you can do to take good care of yourself.    Notes:      Assessment  Heart failure  Patient will benefit from ongoing education and support with self care  managing heart failure , daily weights monitoring, signs symptoms and action plan. Reports wearing CPAP.  Home Health RN next visit 11/16.    Plan Will plan follow up call in the next month.    Joylene Draft, RN, BSN  [place] Management Coordinator  [phone]- Mobile [phone]- Toll Free Main Office

## 2020-10-18 NOTE — Patient Instructions (Signed)
Heart Failure Action Plan A heart failure action plan helps you understand what to do when you have symptoms of heart failure. Follow the plan that was created by you and your health care provider. Review your plan each time you visit your health care provider. Red zone These signs and symptoms mean you should get medical help right away:  You have trouble breathing when resting.  You have a dry cough that is getting worse.  You have swelling or pain in your legs or abdomen that is getting worse.  You suddenly gain more than 2-3 lb (0.9-1.4 kg) in a day, or more than 5 lb (2.3 kg) in one week. This amount may be more or less depending on your condition.  You have trouble staying awake or you feel confused.  You have chest pain.  You do not have an appetite.  You pass out. If you experience any of these symptoms:  Call your local emergency services (911 in the [place] right away or seek help at the emergency department of the nearest hospital. Yellow zone These signs and symptoms mean your condition may be getting worse and you should make some changes:  You have trouble breathing when you are active or you need to sleep with extra pillows.  You have swelling in your legs or abdomen.  You gain 2-3 lb (0.9-1.4 kg) in one day, or 5 lb (2.3 kg) in one week. This amount may be more or less depending on your condition.  You get tired easily.  You have trouble sleeping.  You have a dry cough. If you experience any of these symptoms:  Contact your health care provider within the next day.  Your health care provider may adjust your medicines. Green zone These signs mean you are doing well and can continue what you are doing:  You do not have shortness of breath.  You have very little swelling or no new swelling.  Your weight is stable (no gain or loss).  You have a normal activity level.  You do not have chest pain or any other new symptoms. Follow these instructions at  home:  Take over-the-counter and prescription medicines only as told by your health care provider.  Weigh yourself daily. Your target weight is __________ lb (__________ kg). ? Call your health care provider if you gain more than ____3______ lb (__________ kg) in a day, or more than ____5______ lb (__________ kg) in one week.  Eat a heart-healthy diet. Work with a diet and nutrition specialist (dietitian) to create an eating plan that is best for you.  Keep all follow-up visits as told by your health care provider. This is important. Where to find more information  American Heart Association: www.heart.org Summary  Follow the action plan that was created by you and your health care provider.  Get help right away if you have any symptoms in the Red zone. This information is not intended to replace advice given to you by your health care provider. Make sure you discuss any questions you have with your health care provider. Document Revised: 11/05/2017 Document Reviewed: 01/02/2017 Elsevier Patient Education  2020 [employer].

## 2020-10-19 ENCOUNTER — Other Ambulatory Visit: Payer: Self-pay | Admitting: Student

## 2020-10-22 DIAGNOSIS — I50813 Acute on chronic right heart failure: Secondary | ICD-10-CM | POA: Diagnosis not present

## 2020-10-22 DIAGNOSIS — N1832 Chronic kidney disease, stage 3b: Secondary | ICD-10-CM | POA: Diagnosis not present

## 2020-10-22 DIAGNOSIS — I130 Hypertensive heart and chronic kidney disease with heart failure and stage 1 through stage 4 chronic kidney disease, or unspecified chronic kidney disease: Secondary | ICD-10-CM | POA: Diagnosis not present

## 2020-10-22 DIAGNOSIS — E1122 Type 2 diabetes mellitus with diabetic chronic kidney disease: Secondary | ICD-10-CM | POA: Diagnosis not present

## 2020-10-22 DIAGNOSIS — I2510 Atherosclerotic heart disease of native coronary artery without angina pectoris: Secondary | ICD-10-CM | POA: Diagnosis not present

## 2020-10-22 DIAGNOSIS — I5033 Acute on chronic diastolic (congestive) heart failure: Secondary | ICD-10-CM | POA: Diagnosis not present

## 2020-10-25 DIAGNOSIS — I2510 Atherosclerotic heart disease of native coronary artery without angina pectoris: Secondary | ICD-10-CM | POA: Diagnosis not present

## 2020-10-25 DIAGNOSIS — N1832 Chronic kidney disease, stage 3b: Secondary | ICD-10-CM | POA: Diagnosis not present

## 2020-10-25 DIAGNOSIS — E1122 Type 2 diabetes mellitus with diabetic chronic kidney disease: Secondary | ICD-10-CM | POA: Diagnosis not present

## 2020-10-25 DIAGNOSIS — I130 Hypertensive heart and chronic kidney disease with heart failure and stage 1 through stage 4 chronic kidney disease, or unspecified chronic kidney disease: Secondary | ICD-10-CM | POA: Diagnosis not present

## 2020-10-25 DIAGNOSIS — I5033 Acute on chronic diastolic (congestive) heart failure: Secondary | ICD-10-CM | POA: Diagnosis not present

## 2020-10-25 DIAGNOSIS — I50813 Acute on chronic right heart failure: Secondary | ICD-10-CM | POA: Diagnosis not present

## 2020-10-26 ENCOUNTER — Other Ambulatory Visit: Payer: Self-pay | Admitting: Student

## 2020-10-29 ENCOUNTER — Encounter: Payer: Self-pay | Admitting: Pulmonary Disease

## 2020-10-29 ENCOUNTER — Other Ambulatory Visit: Payer: Self-pay

## 2020-10-29 ENCOUNTER — Ambulatory Visit (INDEPENDENT_AMBULATORY_CARE_PROVIDER_SITE_OTHER): Payer: Medicare Other | Admitting: Pulmonary Disease

## 2020-10-29 VITALS — BP 126/62 | HR 76 | Temp 97.2°F | Ht 69.0 in | Wt 198.4 lb

## 2020-10-29 DIAGNOSIS — I5032 Chronic diastolic (congestive) heart failure: Secondary | ICD-10-CM | POA: Diagnosis not present

## 2020-10-29 DIAGNOSIS — Z23 Encounter for immunization: Secondary | ICD-10-CM

## 2020-10-29 DIAGNOSIS — Z9989 Dependence on other enabling machines and devices: Secondary | ICD-10-CM | POA: Diagnosis not present

## 2020-10-29 DIAGNOSIS — I50813 Acute on chronic right heart failure: Secondary | ICD-10-CM | POA: Diagnosis not present

## 2020-10-29 DIAGNOSIS — E1122 Type 2 diabetes mellitus with diabetic chronic kidney disease: Secondary | ICD-10-CM | POA: Diagnosis not present

## 2020-10-29 DIAGNOSIS — J9611 Chronic respiratory failure with hypoxia: Secondary | ICD-10-CM | POA: Diagnosis not present

## 2020-10-29 DIAGNOSIS — I272 Pulmonary hypertension, unspecified: Secondary | ICD-10-CM

## 2020-10-29 DIAGNOSIS — I130 Hypertensive heart and chronic kidney disease with heart failure and stage 1 through stage 4 chronic kidney disease, or unspecified chronic kidney disease: Secondary | ICD-10-CM | POA: Diagnosis not present

## 2020-10-29 DIAGNOSIS — G4733 Obstructive sleep apnea (adult) (pediatric): Secondary | ICD-10-CM | POA: Diagnosis not present

## 2020-10-29 DIAGNOSIS — N1832 Chronic kidney disease, stage 3b: Secondary | ICD-10-CM | POA: Diagnosis not present

## 2020-10-29 DIAGNOSIS — I5033 Acute on chronic diastolic (congestive) heart failure: Secondary | ICD-10-CM | POA: Diagnosis not present

## 2020-10-29 DIAGNOSIS — I2510 Atherosclerotic heart disease of native coronary artery without angina pectoris: Secondary | ICD-10-CM | POA: Diagnosis not present

## 2020-10-29 DIAGNOSIS — R0602 Shortness of breath: Secondary | ICD-10-CM | POA: Diagnosis not present

## 2020-10-29 NOTE — Progress Notes (Signed)
[name]    [id number]    Oct 06, 1932  Primary Care Physician:Kim, Jeneen Rinks, MD  Referring Physician: [name], MD [address] [place],  [place] [address]  Chief complaint:   Follow-up for obstructive sleep apnea   HPI: Has been compliant with CPAP -Feels CPAP is working well  Recent hospitalization for heart failure  History of congestive heart failure, obstructive sleep apnea, hypoxic respiratory failure on oxygen supplementation  Get short of breath with activity Denies any chest pains or chest discomfort  He knows to pace himself  Denies any chest pains or chest discomfort  Regarding his CPAP -States he tolerates it well with no significant complaints or concerns about it -Feels it helps   Outpatient Encounter Medications as of 10/29/2020  Medication Sig  . acetaminophen (TYLENOL) 325 MG tablet Take 650 mg by mouth every 6 (six) hours as needed (for pain.).  Marland Kitchen apixaban (ELIQUIS) 2.5 MG TABS tablet Take 1 tablet (2.5 mg total) by mouth 2 (two) times daily.  . cloNIDine (CATAPRES) 0.1 MG tablet Take 0.1 mg by mouth 2 (two) times daily.  Marland Kitchen ezetimibe (ZETIA) 10 MG tablet Take 10 mg by mouth daily.  . furosemide (LASIX) 80 MG tablet Take 1 tablet (80 mg total) by mouth 2 (two) times daily.  Marland Kitchen HUMALOG KWIKPEN 100 UNIT/ML KiwkPen Inject 6-8 Units into the skin 3 (three) times daily.   Marland Kitchen icosapent Ethyl (VASCEPA) 1 g capsule TAKE 2 CAPSULES(2 GRAMS) BY MOUTH TWICE DAILY  . LANTUS SOLOSTAR 100 UNIT/ML Solostar Pen Inject 18 Units into the skin every evening.   . metoprolol succinate (TOPROL-XL) 25 MG 24 hr tablet Take 0.5 tablets (12.5 mg total) by mouth daily.  Marland Kitchen Propylene Glycol (SYSTANE BALANCE) 0.6 % SOLN Place 1 drop into both eyes daily as needed (dry eyes).  . rosuvastatin (CRESTOR) 20 MG tablet TAKE 1 TABLET BY MOUTH EVERY DAY  . SYNTHROID 100 MCG tablet Take 100 mcg by mouth daily.   . potassium chloride (KLOR-CON) 10 MEQ tablet Take 2  tablets (20 mEq total) by mouth 2 (two) times daily.   No facility-administered encounter medications on file as of 10/29/2020.    Allergies as of 10/29/2020  . (No Known Allergies)    Past Medical History:  Diagnosis Date  . Chronic kidney disease    tumor right kidney  . Coronary artery disease   . Diabetes mellitus    type 2  . Hypertension   . Myocardial infarction ([place])    1997  . Right renal mass     Past Surgical History:  Procedure Laterality Date  . CARDIAC CATHETERIZATION  1996  . CARDIOVERSION N/A 08/29/2020   Procedure: CARDIOVERSION;  Surgeon: Pixie Casino, MD;  Location[facility] ENDOSCOPY;  Service: Cardiovascular;  Laterality: N/A;  . CORONARY ARTERY BYPASS GRAFT    . DOPPLER ECHOCARDIOGRAPHY  2011  . HERNIA REPAIR    . IR GENERIC HISTORICAL  12/09/2016   IR RADIOLOGIST EVAL & MGMT 12/09/2016 [name], MD [facility] INTERV RAD  . ROBOTIC ASSITED PARTIAL NEPHRECTOMY Right 02/19/2017   Procedure: XI ROBOTIC ASSITED PARTIAL NEPHRECTOMY;  Surgeon: [name], MD;  Location: [facility] ORS;  Service: Urology;  Laterality: Right;  . TEE WITHOUT CARDIOVERSION N/A 08/29/2020   Procedure: TRANSESOPHAGEAL ECHOCARDIOGRAM (TEE);  Surgeon: Pixie Casino, MD;  Location: [facility] ENDOSCOPY;  Service: Cardiovascular;  Laterality: N/A;    Family History  Problem Relation Age of Onset  .  Diabetes Mother   . Diabetes Sister     Social History   Socioeconomic History  . Marital status: Married    Spouse name: Not on file  . Number of children: Not on file  . Years of education: Not on file  . Highest education level: Not on file  Occupational History  . Not on file  Tobacco Use  . Smoking status: Former Smoker    Packs/day: 0.30    Years: 45.00    Pack years: 13.50    Types: Cigarettes    Quit date: 02/15/1997    Years since quitting: 23.7  . Smokeless tobacco: Never Used  Vaping Use  . Vaping Use: Never used  Substance and Sexual Activity  . Alcohol use: No  . Drug  use: No  . Sexual activity: Yes  Other Topics Concern  . Not on file  Social History Narrative  . Not on file   Social Determinants of Health   Financial Resource Strain:   . Difficulty of Paying Living Expenses: Not on file  Food Insecurity:   . Worried About Charity fundraiser in the Last Year: Not on file  . Ran Out of Food in the Last Year: Not on file  Transportation Needs: No Transportation Needs  . Lack of Transportation (Medical): No  . Lack of Transportation (Non-Medical): No  Physical Activity:   . Days of Exercise per Week: Not on file  . Minutes of Exercise per Session: Not on file  Stress:   . Feeling of Stress : Not on file  Social Connections:   . Frequency of Communication with Friends and Family: Not on file  . Frequency of Social Gatherings with Friends and Family: Not on file  . Attends Religious Services: Not on file  . Active Member of Clubs or Organizations: Not on file  . Attends Archivist Meetings: Not on file  . Marital Status: Not on file  Intimate Partner Violence:   . Fear of Current or Ex-Partner: Not on file  . Emotionally Abused: Not on file  . Physically Abused: Not on file  . Sexually Abused: Not on file    Review of Systems  Constitutional: Positive for fatigue.  Respiratory: Positive for apnea and shortness of breath.   Cardiovascular: Positive for leg swelling.  Psychiatric/Behavioral: Positive for sleep disturbance.    Vitals:   10/29/20 1208  BP: 126/62  Pulse: 76  Temp: (!) 97.2 F (36.2 C)  SpO2: 96%     Physical Exam Constitutional:      Appearance: Normal appearance.  HENT:     Mouth/Throat:     Mouth: Mucous membranes are moist.  Eyes:     General:        Right eye: No discharge.        Left eye: No discharge.  Cardiovascular:     Rate and Rhythm: Normal rate and regular rhythm.     Heart sounds: No murmur heard.  No friction rub.  Pulmonary:     Effort: No respiratory distress.     Breath  sounds: No stridor. No wheezing or rhonchi.  Musculoskeletal:     Cervical back: No rigidity or tenderness.  Neurological:     General: No focal deficit present.     Mental Status: He is alert.  Psychiatric:        Mood and Affect: Mood normal.      Data Reviewed: Compliance data reviewed showing machine set at 5-15 95 percentile  pressure of 7.8 AHI of 3.7  Assessment:  Obstructive sleep apnea -Appears well treated at present with CPAP use -CPAP compliance continues to be great AHI adequately controlled  Hypoxic respiratory failure -Request for portable oxygen concentrator -Oxygen supplementation had to be increased when it was initially evaluated, he did decompensate with ambulation, required 4 L of oxygen  Chronic obstructive pulmonary disease  Pulmonary hypertension -Related to heart failure, chronic lung disease -Continue current management  Chronic diastolic heart failure -Continue diuretics  Plan/Recommendations:  .  Continue CPAP  .  Continue bronchodilators  .  Continue diuretics  .  Continue oxygen supplementation to keep sats greater than 89%  .  We will send a prescription to DME company for portable oxygen concentrator  .  Administration of flu vaccine today  .  Follow-up in 5 to 6 months  .  Encouraged to call with any significant concerns   Sherrilyn Rist MD [facility] 10/29/2020, 12:26 PM  CC: [name], MD

## 2020-10-29 NOTE — Patient Instructions (Signed)
DME referral for portable oxygen concentrator  Administration of influenza vaccine today  Continue CPAP -Seems to be working well  Follow-up in 5 to 6 months  Call with significant concerns

## 2020-10-30 ENCOUNTER — Other Ambulatory Visit: Payer: Self-pay | Admitting: Cardiology

## 2020-11-01 DIAGNOSIS — I50813 Acute on chronic right heart failure: Secondary | ICD-10-CM | POA: Diagnosis not present

## 2020-11-01 DIAGNOSIS — I5033 Acute on chronic diastolic (congestive) heart failure: Secondary | ICD-10-CM | POA: Diagnosis not present

## 2020-11-01 DIAGNOSIS — I130 Hypertensive heart and chronic kidney disease with heart failure and stage 1 through stage 4 chronic kidney disease, or unspecified chronic kidney disease: Secondary | ICD-10-CM | POA: Diagnosis not present

## 2020-11-01 DIAGNOSIS — I2510 Atherosclerotic heart disease of native coronary artery without angina pectoris: Secondary | ICD-10-CM | POA: Diagnosis not present

## 2020-11-01 DIAGNOSIS — E1122 Type 2 diabetes mellitus with diabetic chronic kidney disease: Secondary | ICD-10-CM | POA: Diagnosis not present

## 2020-11-01 DIAGNOSIS — N1832 Chronic kidney disease, stage 3b: Secondary | ICD-10-CM | POA: Diagnosis not present

## 2020-11-02 DIAGNOSIS — I2510 Atherosclerotic heart disease of native coronary artery without angina pectoris: Secondary | ICD-10-CM | POA: Diagnosis not present

## 2020-11-04 ENCOUNTER — Ambulatory Visit: Payer: Medicare Other | Admitting: Internal Medicine

## 2020-11-04 ENCOUNTER — Telehealth: Payer: Self-pay | Admitting: Internal Medicine

## 2020-11-04 NOTE — Telephone Encounter (Signed)
Spoke with [name], she reports the patient missed his appointment today and he is SOB with exertion in the home. Follow up scheduled with dr Debara Pickett 11-07-20. He will take an extra 20 mg of furosemide twice daily tomorrow if needed and also extra potassium. She is going to see him tomorrow and if she finds something that concerns her, she will call back.

## 2020-11-04 NOTE — Telephone Encounter (Signed)
Anissa with [facility] calling to state that Cid missed his appointment with Dr. Debara Pickett and he really needed to make it to it due to his weight gain/SOB. Dr. Lysbeth Penner next opening is 02/09 and the earliest appointment with a PA or NP is 12/29. She is trying to see if there is anyway he can be seen sooner. Scheduled patient for 12/29 with Roby Lofts. Please call/advise  Pt c/o swelling: STAT is pt has developed SOB within 24 hours  1) How much weight have you gained and in what time span?  Went from 187 to 191  2) If swelling, where is the swelling located?  Swelling in his legs and a little around his stomach   3) Are you currently taking a fluid pill?  Yes, two times per day   4) Are you currently SOB?  Unknown - she is not with him currently but it has been an ongoing issue   5) Do you have a log of your daily weights (if so, list)?  Has a log, not in front of it right now.   6) Have you gained 3 pounds in a day or 5 pounds in a week?  Unsure   7) Have you traveled recently? No

## 2020-11-07 ENCOUNTER — Other Ambulatory Visit: Payer: Self-pay | Admitting: *Deleted

## 2020-11-07 ENCOUNTER — Ambulatory Visit: Payer: Medicare Other | Admitting: Internal Medicine

## 2020-11-07 NOTE — Patient Outreach (Signed)
[name]) Care Management  [name]  11/07/2020   [name] 03/26/1932 [id number]   Telephone Assessment  Reassigned to Care Coordinator on 10/08/20 from prior Case Manager following patient .   Initial Referral received : 08/15/20 Referral source:[name] Referral reason: Complex care and disease management of Heart failure . Insurance: [name] Admission x 3 in Joseph last 3 months.   Patient PMHX :  [facility] Acute on chronic right heart failure ,Acute on chronic renal failure,Atrial fibrillation. PMHX; includes chronic diastolic heart failure, bilateral pleural effusions, chronic kidney disease, Hypertension , type 2 Diabetes, OSA with CPAP, COPD oxygen,  Dyslipidemia, pulmonary hypertension.    Subjective:  Outreach call to patient her reports doing better on today. Patient discussed recent increase in weight up to 197 a few days ago, he now reports today's weight if 191. He states that swelling has gone down and he is keeping his legs elevated.  Patient states that he is not keeping track of weight everday.Marland Kitchen He reports wearing oxygen at 3 liters and breathing is okay except when he is working on something, he discussed being grateful son was able to clean up leaves in Joseph yard since he can't do it. Patient discussed lung MD ordering smaller oxygen tanks, portable oxygen concentrator reports states current tanks being big to carry around.He discussed having recently delivery of large tank that makes and fills tanks that he has now.  Placed call to [name] , representative states patient has follow up on 12/3 for oxygen conserving device evaluation.   Follow up call to [name] with [name] health , successful outreach to collaborate regarding patient, she reports patient not consistent with daily weights and timing of weights or  keeping a record. She continues to follow patient for concern recent weight gain up to 197 earlier this week,  this week and follow up with  cardiology regarding weight gain,and follow up office appointment. She continues  reinforce education on heart failure diet,daily weight management .she has planned visit on 12/4. Home health continues to management chronic wound at right leg with compression dressing, noted weeping when increase in swelling. Discussed on her visit to leave patient a note regarding cardiology visit on 12/6, she is agreeable, she reports that he missed prior appointment on 11/29.  Discussed with patient benefits of Remote Health for additional support with management of heart failure patient is agreeable with follow up .    Encounter Medications:  Outpatient Encounter Medications as of 11/07/2020  Medication Sig  . acetaminophen (TYLENOL) 325 MG tablet Take 650 mg by mouth every 6 (six) hours as needed (for pain.).  Marland Kitchen apixaban (ELIQUIS) 2.5 MG TABS tablet Take 1 tablet (2.5 mg total) by mouth 2 (two) times daily.  . cloNIDine (CATAPRES) 0.1 MG tablet Take 0.1 mg by mouth 2 (two) times daily.  Marland Kitchen ezetimibe (ZETIA) 10 MG tablet Take 10 mg by mouth daily.  . furosemide (LASIX) 80 MG tablet Take 1 tablet (80 mg total) by mouth 2 (two) times daily.  Marland Kitchen HUMALOG KWIKPEN 100 UNIT/ML KiwkPen Inject 6-8 Units into Joseph skin 3 (three) times daily.   Marland Kitchen icosapent Ethyl (VASCEPA) 1 g capsule TAKE 2 CAPSULES(2 GRAMS) BY MOUTH TWICE DAILY  . LANTUS SOLOSTAR 100 UNIT/ML Solostar Pen Inject 18 Units into Joseph skin every evening.   . metoprolol succinate (TOPROL-XL) 25 MG 24 hr tablet Take 0.5 tablets (12.5 mg total) by  mouth daily.  . potassium chloride (KLOR-CON) 10 MEQ tablet Take 1 tablet (10 mEq total) by mouth 2 (two) times daily.  Marland Kitchen Propylene Glycol (SYSTANE BALANCE) 0.6 % SOLN Place 1 drop into both eyes daily as needed (dry eyes).  . rosuvastatin (CRESTOR) 20 MG tablet TAKE 1 TABLET BY MOUTH  EVERY DAY  . SYNTHROID 100 MCG tablet Take 100 mcg by mouth daily.    No facility-administered encounter medications on file as of 11/07/2020.    Functional Status:  In your present state of health, do you have any difficulty performing Joseph following activities: 08/24/2020 08/24/2020  Hearing? - Y  Vision? - N  Difficulty concentrating or making decisions? - N  Walking or climbing stairs? - Y  Dressing or bathing? - Y  Doing errands, shopping? N -  Some recent data might be hidden    Fall/Depression Screening: Fall Risk  08/19/2020  Falls in Joseph past year? 0  Number falls in past yr: 0  Injury with Fall? 0  Risk for fall due to : Impaired mobility  Follow up Falls evaluation completed;Education provided   [name] 2/9 Scores 08/19/2020  Exception Documentation Other- indicate reason in comment box  Not completed Unable ask patient requested RNCM complete assessment with wife.    Assessment:  Goals Addressed            This Visit's Progress   . Make and Keep All Appointments   Not on track    Follow Up Date 12/05/20   - call to cancel if needed - keep a calendar with appointment dates    Why is this important?   Part of staying healthy is seeing Joseph doctor for follow-up care.  If you forget your appointments, there are some things you can do to stay on track.    Notes:     . Track and Manage Activity and Exertion   On track    Follow Up Date 12/05/20   - follow activity or exercise plan - pace activity allowing for rest    Why is this important?   Exercising is very important when managing your heart failure.  It will help your heart get stronger.    Notes:  Continuing exercises per physical therapy recommendations     . Track and Manage Fluids and Swelling   On track    Follow Up Date 12/05/20   - keep legs up while sitting - track weight in diary - use salt in moderation - watch for swelling in feet, ankles and legs every day - weigh myself daily    Why  is this important?   It is important to check your weight daily and watch how much salt and liquids you have.  It will help you to manage your heart failure.    Notes:     . Track and Manage My Blood Pressure   Not on track    Follow Up Date 12/05/20   - check blood pressure weekly - write blood pressure results in a log or diary    Why is this important?   You won't feel high blood pressure, but it can still hurt your blood vessels.  High blood pressure can cause heart or kidney problems. It can also cause a stroke.  Making lifestyle changes like losing a little weight or eating less salt will help.  Checking your blood pressure at home and at different times of Joseph day can help to control blood pressure.  If Joseph  doctor prescribes medicine remember to take it Joseph way Joseph doctor ordered.  Call Joseph office if you cannot afford Joseph medicine or if there are questions about it.     Notes:     . Track and Manage Symptoms   Not on track    Follow Up Date 12/05/20    - bring diary to all appointments - follow rescue plan if symptoms flare-up - know when to call Joseph doctor - track symptoms and what helps feel better or worse - dress right for Joseph weather, hot or cold    Why is this important?   You will be able to handle your symptoms better if you keep track of them.  Making some simple changes to your lifestyle will help.  Eating healthy is one thing you can do to take good care of yourself.    Notes:         Plan: Patient agreeable to follow up call in Joseph next 2 weeks at agreed upon appointment time frame.  Will begin process for Remote health Referral for home referral program high risk patient.  Will send PCP this visit note for update.   Joylene Draft, RN, BSN  [place] Management Coordinator  [phone]- Mobile [phone]- Toll Free Main Office

## 2020-11-08 ENCOUNTER — Other Ambulatory Visit: Payer: Self-pay | Admitting: *Deleted

## 2020-11-08 NOTE — Patient Outreach (Addendum)
[facility]) Care Management  11/08/2020  [name] 1932-09-21 [id number]   Care Coordination    Telephone Assessment  Reassigned to Care Coordinator on 10/08/20 from prior Case Manager following patient .   Initial Referral received : 08/15/20 Referral source: [place] Referral reason : Complex care and disease management of Heart failure . Insurance: Medicare   Subjective Successful outreach call to Remote Health to discuss patient referral to program. Spoke with [facility] , provided patient contact information, reviewed referral process and remote health form to complete. Discussed patient is currently active with [place] home RN patient will benefit from ongoing services when home health services complete, Caryl Pina states home health does not interfere with Remote health services. Discussed patient agreed to referral service at telephone visit on 11/08/20.  Placed call to Dr. Maudie Mercury office to discuss referral to Remote health for additional support for management of conditions, received message office is closed at this time.   Plan Will plan return call to PCP office to notify of referral to Remote Health program in the next 3 business days.  Send Remote health referral.   Addendum  Received incoming call from [place]  regarding Remote Health referral, discussed plan to follow up with patient  for possible visit on tomorrow regarding Remote Health , provided my contact information and received RN contact information  Josetta Huddle, and Reche Dixon that may follow up with patient .   Joylene Draft, RN, BSN  [place] Management Coordinator  [phone]- Mobile [phone]- Toll Free Main Office

## 2020-11-11 ENCOUNTER — Other Ambulatory Visit: Payer: Self-pay

## 2020-11-11 ENCOUNTER — Ambulatory Visit (INDEPENDENT_AMBULATORY_CARE_PROVIDER_SITE_OTHER): Payer: Medicare Other | Admitting: Internal Medicine

## 2020-11-11 ENCOUNTER — Telehealth: Payer: Self-pay | Admitting: Internal Medicine

## 2020-11-11 ENCOUNTER — Encounter: Payer: Self-pay | Admitting: Internal Medicine

## 2020-11-11 VITALS — BP 128/62 | HR 90 | Ht 69.0 in | Wt 195.4 lb

## 2020-11-11 DIAGNOSIS — I10 Essential (primary) hypertension: Secondary | ICD-10-CM | POA: Diagnosis not present

## 2020-11-11 DIAGNOSIS — I5033 Acute on chronic diastolic (congestive) heart failure: Secondary | ICD-10-CM

## 2020-11-11 NOTE — Telephone Encounter (Signed)
[name] is calling to inform Dr. Debara Pickett that they are now seeing [name] due to [facility] request for chronic care management. She states she is faxing over the order now, and states to feel free to let them know if you all need them for anything. Please advise.

## 2020-11-11 NOTE — Progress Notes (Signed)
OFFICE NOTE  Chief Complaint:  Follow-up heart failure  Primary Care Physician: [name], MD  HPI:  [name] is a 86 y.o. male with a past medial history significant for chronic diastolic heart failure and bilateral pleural effusions.  LVEF is remained stable around 55 to 60%.  He also has a history of mixed pulmonary venous and arterial hypertension and has been seen in pulmonary by Dr. Lake Bells.  He had a moderate sized right pleural effusion which resolved after more aggressive diuresis.  He also has a history of coronary disease and had CABG in 1996.  In addition he had chronic kidney disease however this is improved with creatinine now ranging between 1.5-1.7.  There is a history of hypertension, dyslipidemia, right bundle branch block and obstructive sleep apnea, which he says he is compliant on nightly CPAP.  When I last saw him 6 months ago his weight is unchanged from then.  He told me that he has no new complaints.  He denies chest pain, worsening shortness of breath or other associated symptoms.  He was mowing his lawn today.  He says he can mow his lawn for at least a half an hour.  He says he typically breaks it up in half because of the heat, but does not have any worsening shortness of breath or heart failure symptoms with this.  09/06/2019  [name] returns today for follow-up of a recent telehealth visit in April 2020.  At that time he had stable chronic diastolic heart failure.  In June 2020 he was seen by Kerin Ransom, PA-C in the office, at the time of seem to have some increased swelling and weight gain.  His Lasix was increased to 80 mg twice daily from 60 mg twice daily and he reports some improvement of his symptoms.  Since then his weight has been fairly stable.  He was seen again by pulmonary in July and reports improved symptoms from his diuretic.  Today he says he is doing well.  Denies any chest pain or worsening shortness of breath.  Weight has been fairly stable.   Recent labs show total cholesterol 113, triglycerides 254, HDL 32 and LDL 30 with hemoglobin A1c of 7.6.  EKG shows a junctional rhythm at 56 with lateral ST and T wave changes.  08/15/2020  [name] is seen today for annual follow-up, however he was just admitted and discharged from the hospital yesterday with acute exacerbation of diastolic heart failure and acute kidney injury.  He had reported increasing lower extremity edema and shortness of breath and was admitted on August 08, 2020 for volume overload.  He was subsequently diuresed down to 86 kg and transition back to home diuretics.  He was able to ambulate requiring 4 L of oxygen.  He was noted to be bradycardic with heart rates in the 40s although thought to be asymptomatic and his beta-blockers were not adjusted.  Today an EKG was performed which shows new onset atrial fibrillation and a heart rate of 60.  He is unaware of this.  This is not a known diagnosis for him.  Overall he still reports his breathing is much better.  09/20/2020  [name] is seen today for hospital follow-up.  He was hospitalized with acute diastolic heart failure.  He was diuresed and I was involved in his care.  I performed a TEE on him which showed an EF of 60 to 65% with moderate RV systolic dysfunction.  No evidence of  left atrial appendage thrombus.  The right atrium was mild to moderately dilated.  He was successfully cardioverted and maintained sinus rhythm.  According to nephrology, he was transitioned from a Lasix drip to a recommended 80 mg Lasix twice daily.  Unfortunately he was only diagnosed on 80 mg Lasix daily.  He reports he was taking Lasix only on a daily basis but noticed his urination was less than he had previously urinated.  We received a call from the home health nurse who noted that he had gained significant weight recently.  I increased his Lasix and he is here today for follow-up.  His discharge weight was 171 pounds and currently he weighs 190  pounds.  Blood pressure is poorly controlled today at 200/102.  I rechecked his numbers and he is 190/100.  He said he had not taken his medicines yet today.  He is currently on 3 L oxygen nasal cannula.  11/11/2020  [name] is seen for follow-up.  He was referred back because of home health nursing noticed swelling and some weeping edema.  He is having his legs wrapped.  His weight is actually only up about 5 pound since we saw him.  He was instructed to take extra Lasix which she can do on a sliding scale to help with weight gain.  He is currently on 80 mg twice a day but could take an extra 20 to 40 mg as needed.  He is on oxygen and recently was seen by pulmonary.  Of note today I did remove a bug from his clothing which was concerning for possible bedbugs.  He denies any bites or any evidence of infestation at his house however this should be addressed.  Is close to be sanitized and bedding and linens should be discarded.  PMHx:  Past Medical History:  Diagnosis Date  . Chronic kidney disease    tumor right kidney  . Coronary artery disease   . Diabetes mellitus    type 2  . Hypertension   . Myocardial infarction ([facility])    1997  . Right renal mass     Past Surgical History:  Procedure Laterality Date  . CARDIAC CATHETERIZATION  1996  . CARDIOVERSION N/A 08/29/2020   Procedure: CARDIOVERSION;  Surgeon: Pixie Casino, MD;  Location: [facility] ENDOSCOPY;  Service: Cardiovascular;  Laterality: N/A;  . CORONARY ARTERY BYPASS GRAFT    . DOPPLER ECHOCARDIOGRAPHY  2011  . HERNIA REPAIR    . IR GENERIC HISTORICAL  12/09/2016   IR RADIOLOGIST EVAL & MGMT 12/09/2016 [name], MD [facility] INTERV RAD  . ROBOTIC ASSITED PARTIAL NEPHRECTOMY Right 02/19/2017   Procedure: XI ROBOTIC ASSITED PARTIAL NEPHRECTOMY;  Surgeon: [name], MD;  Location: [facility] ORS;  Service: Urology;  Laterality: Right;  . TEE WITHOUT CARDIOVERSION N/A 08/29/2020   Procedure: TRANSESOPHAGEAL ECHOCARDIOGRAM (TEE);  Surgeon:  Pixie Casino, MD;  Location[facility] ENDOSCOPY;  Service: Cardiovascular;  Laterality: N/A;    FAMHx:  Family History  Problem Relation Age of Onset  . Diabetes Mother   . Diabetes Sister     SOCHx:   reports that he quit smoking about 23 years ago. His smoking use included cigarettes. He has a 13.50 pack-year smoking history. He has never used smokeless tobacco. He reports that he does not drink alcohol and does not use drugs.  ALLERGIES:  No Known Allergies  ROS: Pertinent items noted in HPI and remainder of comprehensive ROS otherwise negative.  HOME MEDS: Current Outpatient Medications  on File Prior to Visit  Medication Sig Dispense Refill  . acetaminophen (TYLENOL) 325 MG tablet Take 650 mg by mouth every 6 (six) hours as needed (for pain.).    Marland Kitchen amLODipine (NORVASC) 5 MG tablet Take 5 mg by mouth daily.    Marland Kitchen apixaban (ELIQUIS) 2.5 MG TABS tablet Take 1 tablet (2.5 mg total) by mouth 2 (two) times daily. 60 tablet 6  . benazepril (LOTENSIN) 40 MG tablet Take 40 mg by mouth daily.    . cloNIDine (CATAPRES) 0.1 MG tablet Take 0.1 mg by mouth 2 (two) times daily.    Marland Kitchen ezetimibe (ZETIA) 10 MG tablet Take 10 mg by mouth daily.    . furosemide (LASIX) 80 MG tablet Take 1 tablet (80 mg total) by mouth 2 (two) times daily. 60 tablet 5  . HUMALOG KWIKPEN 100 UNIT/ML KiwkPen Inject 6-8 Units into the skin 3 (three) times daily.   0  . icosapent Ethyl (VASCEPA) 1 g capsule TAKE 2 CAPSULES(2 GRAMS) BY MOUTH TWICE DAILY 120 capsule 11  . LANTUS SOLOSTAR 100 UNIT/ML Solostar Pen Inject 18 Units into the skin every evening.     . potassium chloride (KLOR-CON) 10 MEQ tablet Take 1 tablet (10 mEq total) by mouth 2 (two) times daily. 180 tablet 3  . Propylene Glycol (SYSTANE BALANCE) 0.6 % SOLN Place 1 drop into both eyes daily as needed (dry eyes).    . rosuvastatin (CRESTOR) 20 MG tablet TAKE 1 TABLET BY MOUTH EVERY DAY 90 tablet 1  . SYNTHROID 100 MCG tablet Take 100 mcg by mouth daily.   0     No current facility-administered medications on file prior to visit.    LABS/IMAGING: No results found for this or any previous visit (from the past 48 hour(s)). No results found.  LIPID PANEL:    Component Value Date/Time   CHOL 95 07/21/2020 0646   TRIG 76 07/21/2020 0646   HDL 28 (L) 07/21/2020 0646   CHOLHDL 3.4 07/21/2020 0646   VLDL 15 07/21/2020 0646   LDLCALC 52 07/21/2020 0646     WEIGHTS: Wt Readings from Last 3 Encounters:  11/11/20 195 lb 6.4 oz (88.6 kg)  10/29/20 198 lb 6.4 oz (90 kg)  09/20/20 190 lb (86.2 kg)    VITALS: BP 128/62   Pulse 90   Ht 5\' 9"  (1.753 m)   Wt 195 lb 6.4 oz (88.6 kg)   BMI 28.86 kg/m   EXAM: General appearance: alert, no distress and On nasal cannula oxygen Neck: no carotid bruit, no JVD and thyroid not enlarged, symmetric, no tenderness/mass/nodules Lungs: diminished breath sounds bibasilar and rales bibasilar Heart: regular rate and rhythm Abdomen: soft, non-tender; bowel sounds normal; no masses,  no organomegaly Extremities: edema 1+ bilateral lower extremity Pulses: 2+ and symmetric Skin: Skin color, texture, turgor normal. No rashes or lesions Neurologic: Grossly normal Psych: Pleasant  EKG: Deferred  ASSESSMENT: 1. Recent onset atrial fibrillation - CHADSVASC score of 5, s/p DCCV - on Eliquis 2. Recent acute on chronicdiastolic congestive heart failure(EF 60-65%, 07/2020, grade 2 DD, moderate RV dysfunction, severe pulm HTN at 65 mmHg) 3. Mixed pulmonary venous/arterial hypertension 4. Moderate sized pericardial effusion - resolved 5. CAD status post CABG 1996 6. CKD with improving creatinine now 1.54 (as high as 3.84, 2.87, 2.36 recently) 7. Hypertension 8. Dyslipidemia 9. RBBB 10. OSA on CPAP  11.Type 2 diabetes-hemoglobin A1c 7.6 12.  Possible bedbugs  PLAN: 1.   [name] has had about 5 pound  weight gain.  He is had some weeping edema and we advised him over the phone to take more Lasix.  He is  currently on 80 mg twice daily and could take an extra 20 to 40 mg a day for additional swelling.  I would continue his current therapies but will need to follow-up closely back in a few months.  He was also possibly noted to harbor a bedbug today.  This should be investigated further at home and as mentioned above bedding should be cleaned or discarded and he may need to consider a new mattress.  Follow-up in 3 months.  Pixie Casino, MD, [facility], [place] Director of the Advanced Lipid Disorders &  Cardiovascular Risk Reduction Clinic Diplomate of the American Board of Clinical Lipidology Attending Cardiologist  Direct Dial: [phone]  Fax: [phone]  Website:  [email or web address] 11/11/2020, 11:30 AM

## 2020-11-11 NOTE — Patient Instructions (Signed)
Medication Instructions:  Your physician recommends that you continue on your current medications as directed. Please refer to the Current Medication list given to you today.  *If you need a refill on your cardiac medications before your next appointment, please call your pharmacy*   Follow-Up: At [facility], you and your health needs are our priority.  As part of our continuing mission to provide you with exceptional heart care, we have created designated Provider Care Teams.  These Care Teams include your primary Cardiologist (physician) and Advanced Practice Providers (APPs -  Physician Assistants and Nurse Practitioners) who all work together to provide you with the care you need, when you need it.  We recommend signing up for the patient portal called "MyChart".  Sign up information is provided on this After Visit Summary.  MyChart is used to connect with patients for Virtual Visits (Telemedicine).  Patients are able to view lab/test results, encounter notes, upcoming appointments, etc.  Non-urgent messages can be sent to your provider as well.   To learn more about what you can do with MyChart, go to [email or web address].    Your next appointment:   3 month(s)  The format for your next appointment:   In Person  Provider:   You may see Kenneth C Hilty, MD or one of the following Advanced Practice Providers on your designated Care Team:    Hao Meng, PA-C  Angela Duke, PA-C or   Krista Kroeger, PA-C    Other Instructions   

## 2020-11-11 NOTE — Telephone Encounter (Signed)
Spoke with [name], the patient has an appointment today with dr hilty and they have faxed a note to let dr hilty know what they saw in his home over the weekend.  The note was faxed this morning. Will make dr hilty's nurse, Eliezer Lofts, aware.

## 2020-11-12 DIAGNOSIS — I272 Pulmonary hypertension, unspecified: Secondary | ICD-10-CM | POA: Diagnosis not present

## 2020-11-12 DIAGNOSIS — I10 Essential (primary) hypertension: Secondary | ICD-10-CM | POA: Diagnosis not present

## 2020-11-12 DIAGNOSIS — G4733 Obstructive sleep apnea (adult) (pediatric): Secondary | ICD-10-CM | POA: Diagnosis not present

## 2020-11-12 DIAGNOSIS — E119 Type 2 diabetes mellitus without complications: Secondary | ICD-10-CM | POA: Diagnosis not present

## 2020-11-12 DIAGNOSIS — I5033 Acute on chronic diastolic (congestive) heart failure: Secondary | ICD-10-CM | POA: Diagnosis not present

## 2020-11-12 DIAGNOSIS — J9611 Chronic respiratory failure with hypoxia: Secondary | ICD-10-CM | POA: Diagnosis not present

## 2020-11-12 DIAGNOSIS — N179 Acute kidney failure, unspecified: Secondary | ICD-10-CM | POA: Diagnosis not present

## 2020-11-12 DIAGNOSIS — I50813 Acute on chronic right heart failure: Secondary | ICD-10-CM | POA: Diagnosis not present

## 2020-11-13 DIAGNOSIS — E559 Vitamin D deficiency, unspecified: Secondary | ICD-10-CM | POA: Diagnosis not present

## 2020-11-13 DIAGNOSIS — E118 Type 2 diabetes mellitus with unspecified complications: Secondary | ICD-10-CM | POA: Diagnosis not present

## 2020-11-13 DIAGNOSIS — E039 Hypothyroidism, unspecified: Secondary | ICD-10-CM | POA: Diagnosis not present

## 2020-11-13 DIAGNOSIS — N390 Urinary tract infection, site not specified: Secondary | ICD-10-CM | POA: Diagnosis not present

## 2020-11-13 DIAGNOSIS — I10 Essential (primary) hypertension: Secondary | ICD-10-CM | POA: Diagnosis not present

## 2020-11-13 NOTE — Telephone Encounter (Signed)
No changes were made at patient's last visit

## 2020-11-14 ENCOUNTER — Telehealth: Payer: Self-pay | Admitting: Internal Medicine

## 2020-11-14 NOTE — Telephone Encounter (Signed)
° ° °  Anissa RN - [facility] home health calling, she said the pt couldn't remember what was his weight and if there's any medications changes from his last OV visit, she requested to speak with a nurse

## 2020-11-14 NOTE — Telephone Encounter (Signed)
Spoke with Anissa. Clarified weight and medication list. No further questions.

## 2020-11-20 ENCOUNTER — Other Ambulatory Visit: Payer: Self-pay | Admitting: *Deleted

## 2020-11-20 ENCOUNTER — Telehealth: Payer: Self-pay | Admitting: Pulmonary Disease

## 2020-11-20 NOTE — Patient Outreach (Signed)
[facility]) Care Management  [place]  11/20/2020   CORDERO SURETTE 1932-02-28 [id number]   Telephone Assessment  Reassigned to Care Coordinator on 10/08/20 from prior Case Manager following patient .   Initial Referral received : 08/15/20 Referral source:Hospital Liaision Referral reason: Complex care and disease management of Heart failure . Insurance: [facility] Admission x 3 in the last 3 months.  [facility]HX :[facility] Acute on chronic right heart failure ,Acute on chronic renal failure,Atrial fibrillation. PMHX; includes chronic diastolic heart failure, bilateral pleural effusions, chronic kidney disease, Hypertension , type 2 Diabetes, OSAwith CPAP,COPD oxygen,Dyslipidemia, pulmonary hypertension.     Subjective:  Successful outreach call to patient he reports doing okay. He reports breathing good, but he gets tired out when doing activity such as bathing, he reports wearing oxygen and cpap at night.  Patient discussed having follow with home health nurse twice weekly and the new Hospital nurse ( [facility] ). He reports remote health nurse visit on this morning.  Patient reports weight has improved and staying steady at 191 for the last 2 days. He reports continuing to have swelling in lower legs that has improved, working on keeping legs elevated as much as possible and watching salt.  Patient discussed having visit with Dr.Kim on tomorrow he reports planning to drive himself as usual, but if doesn't his son or daughter can drive him encouraged support from family to help conserve his energy.  Patient states that he has not heard back from [facility] about the smaller oxygen machine the doctor ordered for him  I placed call to [employer] [facility] regarding patient request,representative states they do not have a request for portable oxygen concentrator,  placed call to Dr. Ander Slade office .  Patient discussed having low blood sugar on this am reading 59, reported feeling better after drinking orange juice and then eating breakfast. Encouraged patient rechecking blood sugar after treating low reading, he denied having symptoms of low blood sugar. Reported checking blood sugars at least twice daily with reading later in day 100-140. Marland Kitchen Reviewed signs symptoms low blood sugar and how to treat reinforced importance of rechecking blood sugar after a low reading. Discussed with patient importance of Dr. Maudie Mercury being made aware of low reading, encouraged to take his meter to visit on tomorrow for review. Encouraged balanced meals throughout the day.  Patient reports he  discussed low blood sugar reading with [facility] RN that visited on today.  Placed call to [facility] with Remote health , she states patient with improvement with weighing but not keeping a log, education emphasizing calling MD. RN with weight increases.  Lattie Haw discussed she plans  to notify Dr.Kim of patient low blood sugar episodes.      Encounter Medications:  Outpatient Encounter Medications as of 11/20/2020  Medication Sig  . acetaminophen (TYLENOL) 325 MG tablet Take 650 mg by mouth every 6 (six) hours as needed (for pain.).  Marland Kitchen amLODipine (NORVASC) 5 MG tablet Take 5 mg by mouth daily.  Marland Kitchen apixaban (ELIQUIS) 2.5 MG TABS tablet Take 1 tablet (2.5 mg total) by mouth 2 (two) times daily.  . benazepril (LOTENSIN) 40 MG tablet Take 40 mg by mouth daily.  . cloNIDine (CATAPRES) 0.1 MG tablet Take 0.1 mg by mouth 2 (two) times daily.  Marland Kitchen ezetimibe (ZETIA) 10 MG tablet Take 10 mg by mouth daily.  . furosemide (LASIX) 80 MG tablet Take 1 tablet (80 mg  total) by mouth 2 (two) times daily.  Marland Kitchen HUMALOG KWIKPEN 100 UNIT/ML KiwkPen Inject 6-8 Units into the skin 3 (three) times daily.   Marland Kitchen icosapent Ethyl (VASCEPA) 1 g capsule TAKE 2 CAPSULES(2 GRAMS) BY MOUTH TWICE DAILY  . LANTUS SOLOSTAR 100 UNIT/ML  Solostar Pen Inject 18 Units into the skin every evening.   . potassium chloride (KLOR-CON) 10 MEQ tablet Take 1 tablet (10 mEq total) by mouth 2 (two) times daily.  Marland Kitchen Propylene Glycol (SYSTANE BALANCE) 0.6 % SOLN Place 1 drop into both eyes daily as needed (dry eyes).  . rosuvastatin (CRESTOR) 20 MG tablet TAKE 1 TABLET BY MOUTH EVERY DAY  . SYNTHROID 100 MCG tablet Take 100 mcg by mouth daily.    No facility-administered encounter medications on file as of 11/20/2020.    Functional Status:  In your present state of health, do you have any difficulty performing the following activities: 08/24/2020 08/24/2020  Hearing? - Y  Vision? - N  Difficulty concentrating or making decisions? - N  Walking or climbing stairs? - Y  Dressing or bathing? - Y  Doing errands, shopping? N -  Some recent data might be hidden    Fall/Depression Screening: Fall Risk  08/19/2020  Falls in the past year? 0  Number falls in past yr: 0  Injury with Fall? 0  Risk for fall due to : Impaired mobility  Follow up Falls evaluation completed;Education provided   [facility] 2/9 Scores 08/19/2020  Exception Documentation Other- indicate reason in comment box  Not completed Unable ask patient requested RNCM complete assessment with wife.    Assessment:  Goals Addressed            This Visit's Progress   . Make and Keep All Appointments   On track    Follow Up Date 01/05/21 Timeframe:  Long-Range Goal Priority:  Medium Start Date:   10/18/20                          Expected End Date:  02/03/21                       - call to cancel if needed - keep a calendar with appointment dates    Why is this important?   Part of staying healthy is seeing the doctor for follow-up care.  If you forget your appointments, there are some things you can do to stay on track.    Notes:     . Track and Manage Activity and Exertion   On track    Follow Up Date 01/05/21   - follow activity or exercise plan - pace activity  allowing for rest    Why is this important?   Exercising is very important when managing your heart failure.  It will help your heart get stronger.    Notes:  Continuing exercises per physical therapy recommendations     . Track and Manage Fluids and Swelling   Not on track    Follow Up Date 02/03/21 Timeframe:  Long-Range Goal Priority:  High Start Date:    10/18/20                         Expected End Date:   02/03/21                      - keep legs up while sitting - track  weight in diary - use salt in moderation - watch for swelling in feet, ankles and legs every day - weigh myself daily    Why is this important?   It is important to check your weight daily and watch how much salt and liquids you have.  It will help you to manage your heart failure.    Notes: 191 today weight    . Track and Manage My Blood Pressure   Not on track    Follow Up Date 02/03/21 Timeframe:  Long-Range Goal Priority:  Medium Start Date:   10/18/20                          Expected End Date:   02/04/20                      - check blood pressure weekly - write blood pressure results in a log or diary    Why is this important?   You won't feel high blood pressure, but it can still hurt your blood vessels.  High blood pressure can cause heart or kidney problems. It can also cause a stroke.  Making lifestyle changes like losing a little weight or eating less salt will help.  Checking your blood pressure at home and at different times of the day can help to control blood pressure.  If the doctor prescribes medicine remember to take it the way the doctor ordered.  Call the office if you cannot afford the medicine or if there are questions about it.     Notes:     . Track and Manage Symptoms   On track    Follow Up Date 01/06/20 Timeframe:  Long-Range Goal Priority:  High Start Date:    10/18/20                         Expected End Date:  02/03/21                       - bring diary to all  appointments - follow rescue plan if symptoms flare-up - know when to call the doctor - track symptoms and what helps feel better or worse - dress right for the weather, hot or cold    Why is this important?   You will be able to handle your symptoms better if you keep track of them.  Making some simple changes to your lifestyle will help.  Eating healthy is one thing you can do to take good care of yourself.    Notes:      Patient will benefit from ongoing education/support in management of heart failure   Plan:  Follow-up:  Patient agrees to Care Plan and Follow-up.  Will plan follow up call within the next 2 weeks patient agrees, reinforced to contact RN care coordinator sooner if new concerns.     Joylene Draft, RN, BSN  [place] Management Coordinator  [phone]- Mobile [phone]- Toll Free Main Office

## 2020-11-21 DIAGNOSIS — E559 Vitamin D deficiency, unspecified: Secondary | ICD-10-CM | POA: Diagnosis not present

## 2020-11-21 DIAGNOSIS — I10 Essential (primary) hypertension: Secondary | ICD-10-CM | POA: Diagnosis not present

## 2020-11-21 DIAGNOSIS — E038 Other specified hypothyroidism: Secondary | ICD-10-CM | POA: Diagnosis not present

## 2020-11-21 DIAGNOSIS — I25111 Atherosclerotic heart disease of native coronary artery with angina pectoris with documented spasm: Secondary | ICD-10-CM | POA: Diagnosis not present

## 2020-11-21 DIAGNOSIS — I272 Pulmonary hypertension, unspecified: Secondary | ICD-10-CM | POA: Diagnosis not present

## 2020-11-21 DIAGNOSIS — E870 Hyperosmolality and hypernatremia: Secondary | ICD-10-CM | POA: Diagnosis not present

## 2020-11-21 DIAGNOSIS — I509 Heart failure, unspecified: Secondary | ICD-10-CM | POA: Diagnosis not present

## 2020-11-21 DIAGNOSIS — E782 Mixed hyperlipidemia: Secondary | ICD-10-CM | POA: Diagnosis not present

## 2020-11-21 DIAGNOSIS — R609 Edema, unspecified: Secondary | ICD-10-CM | POA: Diagnosis not present

## 2020-11-21 DIAGNOSIS — N183 Chronic kidney disease, stage 3 unspecified: Secondary | ICD-10-CM | POA: Diagnosis not present

## 2020-11-21 DIAGNOSIS — N3000 Acute cystitis without hematuria: Secondary | ICD-10-CM | POA: Diagnosis not present

## 2020-11-21 DIAGNOSIS — E118 Type 2 diabetes mellitus with unspecified complications: Secondary | ICD-10-CM | POA: Diagnosis not present

## 2020-11-22 NOTE — Telephone Encounter (Signed)
Called [employer] & spoke to [place].  They do have the order we sent in on 11/23.  She does not see where the RT team evaluated pt.  She is going to send them message now.  Nothing further needed.

## 2020-11-27 ENCOUNTER — Other Ambulatory Visit: Payer: Self-pay | Admitting: Internal Medicine

## 2020-11-28 ENCOUNTER — Telehealth: Payer: Self-pay | Admitting: Internal Medicine

## 2020-11-28 NOTE — Telephone Encounter (Signed)
[name] is calling to report the patient's weight being 192 lbs today.

## 2020-11-28 NOTE — Telephone Encounter (Signed)
[facility] RN Anissa calling to inform us that pt's weight is 192 lbs today, which is up 1 lb compared to yesterday. Anissa states that her protocols prompt her to call about even small changes in the pt's weight because the protocol was designed based on the pt's initial weigh in with home health. She reports that the patient is in no distress, is not experiencing shortness of breath, and is not experiencing any increases in edema. She will be following up with the patient again on Monday. Pt is taking all medications as described.   Thanked Therapist, sports for making [place] aware. Advised to continue current regimen as prescribed and to make [place] aware of any further weight fluctuations or development of any concerning symptoms. RN verbalizes understanding.

## 2020-12-02 DIAGNOSIS — I2510 Atherosclerotic heart disease of native coronary artery without angina pectoris: Secondary | ICD-10-CM | POA: Diagnosis not present

## 2020-12-04 ENCOUNTER — Ambulatory Visit: Payer: Medicare Other | Admitting: Medical

## 2020-12-10 ENCOUNTER — Other Ambulatory Visit: Payer: Self-pay | Admitting: *Deleted

## 2020-12-10 NOTE — Patient Outreach (Signed)
[facility]) Care Management  [place]  12/10/2020   [name] 01-09-1932 [id number]   Telephone Assessment   Initial Referral received : 08/15/20 Referral source:Hospital Liaision Referral reason: Complex care and disease management of Heart failure . Insurance: Medicare  [facility] Acute on chronic right heart failure ,Acute on chronic renal failure,Atrial fibrillation. PMHX; includes chronic diastolic heart failure, bilateral pleural effusions, chronic kidney disease stage 3, Hypertension , type 2 Diabetes, OSAwith CPAP, home oxygen ,COPD, venous stasis dermatitis,Dyslipidemia, pulmonary hypertension.    Subjective:  Successful outreach call to patient on today, he reports doing okay,just tired as he has been out paying bills.  He discussed that his weight yesterday was 190, has weighted today,he  reports still having some swelling in lower legs, and has compression wraps by home health RN, home visit on yesterday.  He discussed concern regarding slow to heal area on  Right lower leg and states they are trying to get him to be seen at the wound center.  Patient denies recent low blood sugar reading states CBG 104 this morning, he states no change in insulin since recent PCP visit, reports being advised to have a snack at bedtime, states that he has been working on doing that, reviewed protein/carb snack options. He is able to state low blood sugar reading and symptoms and how to treat.   Patient reports continuing to wear oxygen at 3 liters, wearing CPAP. Discussed being safe during the covid pandemic, he admits to forgetting mask at times. He has not had vaccines, states that he is thinking about it , and identifies that he know that he can get it at his [place].    Care Coordination call to [facility] RN, to collaborate regarding concerns with lower  extremity , spoke with [facility] unable to view patient records at this time due to technical issues, they will return call when system restored.     Encounter Medications:  Outpatient Encounter Medications as of 12/10/2020  Medication Sig  . acetaminophen (TYLENOL) 325 MG tablet Take 650 mg by mouth every 6 (six) hours as needed (for pain.).  Marland Kitchen amLODipine (NORVASC) 5 MG tablet Take 5 mg by mouth daily.  Marland Kitchen apixaban (ELIQUIS) 2.5 MG TABS tablet Take 1 tablet (2.5 mg total) by mouth 2 (two) times daily.  . benazepril (LOTENSIN) 40 MG tablet Take 40 mg by mouth daily.  . cloNIDine (CATAPRES) 0.1 MG tablet Take 0.1 mg by mouth 2 (two) times daily.  Marland Kitchen ezetimibe (ZETIA) 10 MG tablet Take 10 mg by mouth daily.  . furosemide (LASIX) 80 MG tablet Take 1 tablet (80 mg total) by mouth 2 (two) times daily.  Marland Kitchen HUMALOG KWIKPEN 100 UNIT/ML KiwkPen Inject 6-8 Units into the skin 3 (three) times daily.   Marland Kitchen icosapent Ethyl (VASCEPA) 1 g capsule TAKE 2 CAPSULES(2 GRAMS) BY MOUTH TWICE DAILY  . LANTUS SOLOSTAR 100 UNIT/ML Solostar Pen Inject 18 Units into the skin every evening.   . potassium chloride (KLOR-CON) 10 MEQ tablet Take 1 tablet (10 mEq total) by mouth 2 (two) times daily.  Marland Kitchen Propylene Glycol (SYSTANE BALANCE) 0.6 % SOLN Place 1 drop into both eyes daily as needed (dry eyes).  . rosuvastatin (CRESTOR) 20 MG tablet TAKE 1 TABLET BY MOUTH EVERY DAY  . SYNTHROID 100 MCG tablet Take 100 mcg by mouth daily.    No facility-administered encounter medications on file as of 12/10/2020.  Functional Status:  In your present state of health, do you have any difficulty performing the following activities: 08/24/2020 08/24/2020  Hearing? - Y  Vision? - N  Difficulty concentrating or making decisions? - N  Walking or climbing stairs? - Y  Dressing or bathing? - Y  Doing errands, shopping? N -  Some recent data might be hidden    Fall/Depression Screening: Fall Risk  08/19/2020  Falls in the past year? 0  Number  falls in past yr: 0  Injury with Fall? 0  Risk for fall due to : Impaired mobility  Follow up Falls evaluation completed;Education provided   [facility] 2/9 Scores 08/19/2020  Exception Documentation Other- indicate reason in comment box  Not completed Unable ask patient requested RNCM complete assessment with wife.    Assessment:  Goals Addressed            This Visit's Progress   . Make and Keep All Appointments   On track    Follow Up Date 01/05/21 Timeframe:  Long-Range Goal Priority:  Medium Start Date:   10/18/20                          Expected End Date:  02/03/21                       - call to cancel if needed - keep a calendar with appointment dates    Why is this important?   Part of staying healthy is seeing the doctor for follow-up care.  If you forget your appointments, there are some things you can do to stay on track.    Notes:     . Track and Manage Activity and Exertion   On track    Follow Up Date 01/05/21   - follow activity or exercise plan - pace activity allowing for rest    Why is this important?   Exercising is very important when managing your heart failure.  It will help your heart get stronger.    Notes:  Continuing exercises per physical therapy recommendations     . Track and Manage Fluids and Swelling   On track    Follow Up Date 02/03/21 Timeframe:  Long-Range Goal Priority:  High Start Date:    10/18/20                         Expected End Date:   02/03/21                      - keep legs up while sitting - track weight in diary - use salt in moderation - watch for swelling in feet, ankles and legs every day - weigh myself daily    Why is this important?   It is important to check your weight daily and watch how much salt and liquids you have.  It will help you to manage your heart failure.    Notes: 191 today weight    . Track and Manage My Blood Pressure   Not on track    Follow Up Date 02/03/21 Timeframe:  Long-Range  Goal Priority:  Medium Start Date:   10/18/20                          Expected End Date:   02/04/20                      -  check blood pressure weekly - write blood pressure results in a log or diary    Why is this important?   You won't feel high blood pressure, but it can still hurt your blood vessels.  High blood pressure can cause heart or kidney problems. It can also cause a stroke.  Making lifestyle changes like losing a little weight or eating less salt will help.  Checking your blood pressure at home and at different times of the day can help to control blood pressure.  If the doctor prescribes medicine remember to take it the way the doctor ordered.  Call the office if you cannot afford the medicine or if there are questions about it.     Notes: Reinforced monitoring at home, verified having equipment and knowledge of use.     . Track and Manage Symptoms   On track    Follow Up Date 01/06/20 Timeframe:  Long-Range Goal Priority:  High Start Date:    10/18/20                         Expected End Date:  02/03/21                       - bring diary to all appointments - follow rescue plan if symptoms flare-up - know when to call the doctor - track symptoms and what helps feel better or worse - dress right for the weather, hot or cold    Why is this important?   You will be able to handle your symptoms better if you keep track of them.  Making some simple changes to your lifestyle will help.  Eating healthy is one thing you can do to take good care of yourself.    Notes:       Patient will continue with benefit from ongoing care management services for management of chronic medical conditions patient is agreeable to continued outreaches monthly and sooner as needed.  Patient is followed by [facility] as well as [facility] and RN will continue to collaborate with .   Plan:  Follow-up:  Patient agrees to Care Plan and Follow-up. Will Schedule follow up call  in the next 3 weeks.   Joylene Draft, RN, BSN  [place] Management Coordinator  [phone]- Mobile [phone]- Toll Free Main Office

## 2020-12-10 NOTE — Patient Instructions (Signed)

## 2020-12-19 ENCOUNTER — Encounter (HOSPITAL_BASED_OUTPATIENT_CLINIC_OR_DEPARTMENT_OTHER): Payer: Medicare Other | Attending: Internal Medicine | Admitting: Internal Medicine

## 2020-12-19 ENCOUNTER — Other Ambulatory Visit: Payer: Self-pay

## 2020-12-19 DIAGNOSIS — I5032 Chronic diastolic (congestive) heart failure: Secondary | ICD-10-CM | POA: Diagnosis not present

## 2020-12-19 DIAGNOSIS — I87331 Chronic venous hypertension (idiopathic) with ulcer and inflammation of right lower extremity: Secondary | ICD-10-CM | POA: Insufficient documentation

## 2020-12-19 DIAGNOSIS — I890 Lymphedema, not elsewhere classified: Secondary | ICD-10-CM | POA: Insufficient documentation

## 2020-12-19 DIAGNOSIS — L97811 Non-pressure chronic ulcer of other part of right lower leg limited to breakdown of skin: Secondary | ICD-10-CM | POA: Insufficient documentation

## 2020-12-19 DIAGNOSIS — L97819 Non-pressure chronic ulcer of other part of right lower leg with unspecified severity: Secondary | ICD-10-CM | POA: Diagnosis present

## 2020-12-19 DIAGNOSIS — Z7901 Long term (current) use of anticoagulants: Secondary | ICD-10-CM | POA: Diagnosis not present

## 2020-12-19 DIAGNOSIS — I87321 Chronic venous hypertension (idiopathic) with inflammation of right lower extremity: Secondary | ICD-10-CM | POA: Diagnosis not present

## 2020-12-19 DIAGNOSIS — Z87891 Personal history of nicotine dependence: Secondary | ICD-10-CM | POA: Diagnosis not present

## 2020-12-19 DIAGNOSIS — N183 Chronic kidney disease, stage 3 unspecified: Secondary | ICD-10-CM | POA: Insufficient documentation

## 2020-12-19 DIAGNOSIS — I4891 Unspecified atrial fibrillation: Secondary | ICD-10-CM | POA: Insufficient documentation

## 2020-12-19 DIAGNOSIS — E1122 Type 2 diabetes mellitus with diabetic chronic kidney disease: Secondary | ICD-10-CM | POA: Diagnosis not present

## 2020-12-19 DIAGNOSIS — E11622 Type 2 diabetes mellitus with other skin ulcer: Secondary | ICD-10-CM | POA: Diagnosis not present

## 2020-12-19 NOTE — Progress Notes (Signed)
Joseph, [name] ([id number]) Visit Report for 12/19/2020 Chief Complaint Document Details Patient Name: Date of Service: [place], [name] 12/19/2020 1:15 PM Medical Record Number: [id number] Patient Account Number: [id number] Date of Birth/Sex: Treating RN: 1932-02-04 (87 y.o. [name] Primary Care Provider: [name] Provider: Treating Provider/Extender: Bebe Shaggy in Treatment: 0 Information Obtained from: Patient Chief Complaint 12/19/2020; patient is here for review of wounds on his right anterior lower Electronic Signature(s) Signed: 12/19/2020 5:01:02 PM By: Linton Ham MD Entered By: Linton Ham on 12/19/2020 14:26:03 -------------------------------------------------------------------------------- HPI Details Patient Name: Date of Service: [name], [name] 12/19/2020 1:15 PM Medical Record Number: [id number] Patient Account Number: [id number] Date of Birth/Sex: Treating RN: 1932/10/05 (87 y.o. [name] Primary Care Provider: [name] Provider: Treating Provider/Extender: Bebe Shaggy in Treatment: 0 History of Present Illness HPI Description: ADMISSION 12/19/2020 This is a 87 year old man who arrives accompanied by his son who used to be a patient in this clinic. He has had chronic wounds on the right anterior lower leg which have been opening and closing on and off for the last 7 to 8 months. He says these will blister then opened up then closed and blister opened up and closed. He has [facility] and I think they have been applying alginate and 3 layer wraps however apparently the 3 layer light wrap was not put on properly. He is also had compression stockings but does not feel he can get these on. Past medical history includes COPD on chronic oxygen, pulmonary hypertension, obstructive sleep apnea, type 2 diabetes, diastolic heart failure,  chronic kidney disease stage III and atrial fibrillation on Eliquis ABI in our clinic was 1.04 on the right Electronic Signature(s) Signed: 12/19/2020 5:01:02 PM By: Linton Ham MD Entered By: Linton Ham on 12/19/2020 14:28:22 -------------------------------------------------------------------------------- Physical Exam Details Patient Name: Date of Service: [name], [name] 12/19/2020 1:15 PM Medical Record Number: [id number] Patient Account Number: [id number] Date of Birth/Sex: Treating RN: 1932-11-13 (87 y.o. [name] Primary Care Provider: [name] Provider: Treating Provider/Extender: Bebe Shaggy in Treatment: 0 Constitutional Patient is hypertensive.. Pulse regular and within target range for patient.Marland Kitchen Respirations regular, non-labored and within target range.. Temperature is normal and within the target range for the patient.Marland Kitchen Appears in no distress. Respiratory work of breathing is normal. Cardiovascular Pedal pulses on the right are palpable so is his femoral pulse although I had trouble feeling the popliteal. Severe bilateral lymphedema. Skin changes related to chronic lymphedema. Lipodermatosclerosis of the skin involving the distal right lower leg. Notes Wound exam; the patient has innumerable small open areas on the right anterior tibial area. None of these look particularly ominous surface is clean. He does not have an arterial issue. On the left leg he has severe edema as well with nodular changes present likely reflecting chronic lymphedema type changes Electronic Signature(s) Signed: 12/19/2020 5:01:02 PM By: Linton Ham MD Entered By: Linton Ham on 12/19/2020 14:29:59 -------------------------------------------------------------------------------- Physician Orders Details Patient Name: Date of Service: [name], [name] 12/19/2020 1:15 PM Medical Record Number: [id number] Patient Account  Number: [id number] Date of Birth/Sex: Treating RN: 1932-09-20 (87 y.o. [name] Primary Care Provider: [name] Provider: Treating Provider/Extender: Bebe Shaggy in Treatment: 0 Verbal / Phone Orders: No Diagnosis Coding ICD-10 Coding Code Description I89.0 Lymphedema, not elsewhere classified Follow-up  Appointments Return Appointment in 1 week. Bathing/ Shower/ Hygiene May shower with protection but do not get wound dressing(s) wet. - when not changing the dressing. May shower and wash wound with soap and water. - with dressing changes only. Edema Control - Lymphedema / SCD / Other Elevate legs to the level of the heart or above for 30 minutes daily and/or when sitting, a frequency of: - throughout the day 3-4 times a day. Avoid standing for long periods of time. Patient to wear own compression stockings every day. - Apply compression stocking to left leg in the morning and remove at night. Apply lotion every night after removing stocking. Exercise regularly [place] wound care orders this week; continue Home Health for wound care. May utilize formulary equivalent dressing for wound treatment orders unless otherwise specified. - [facility] to change twice a week. Wound Center weekly. Wound Treatment Wound #1 - Lower Leg Wound Laterality: Right, Anterior Cleanser: Soap and Water (Home Health) 3 x Per Week/30 Days Discharge Instructions: Home Health to wash leg with soap and water with dressing changes. May shower and wash wound with dial antibacterial soap and water prior to dressing change. Peri-Wound Care: Triamcinolone 15 (g) 3 x Per Week/30 Days Discharge Instructions: In clinic apply liberally. Use triamcinolone 15 (g) as directed Peri-Wound Care: Zinc Oxide Ointment 30g tube (Home Health) 3 x Per Week/30 Days Discharge Instructions: Apply Zinc Oxide as needed for maceration, wetness to periwound with  each dressing change Peri-Wound Care: Sween Lotion (Moisturizing lotion) (Home Health) 3 x Per Week/30 Days Discharge Instructions: home health to Apply moisturizing lotion as directed Prim Dressing: KerraCel Ag Gelling Fiber Dressing, 4x5 in (silver alginate) (Home Health) 3 x Per Week/30 Days ary Discharge Instructions: Apply silver alginate to wound bed as instructed Secondary Dressing: Woven Gauze Sponge, Non-Sterile 4x4 in (Home Health) 3 x Per Week/30 Days Discharge Instructions: Apply over primary dressing as directed. Secondary Dressing: ABD Pad, 8x10 (Home Health) 3 x Per Week/30 Days Discharge Instructions: Apply over primary dressing as directed. Compression Wrap: ThreePress (3 layer compression wrap) (Home Health) 3 x Per Week/30 Days Discharge Instructions: Apply three layer compression as directed. Apply just above toes and just below the knee (tibial tuberosity). Electronic Signature(s) Signed: 12/19/2020 5:01:02 PM By: Linton Ham MD Signed: 12/19/2020 5:39:22 PM By: [name] Entered By: [name] on 12/19/2020 14:01:51 -------------------------------------------------------------------------------- Problem List Details Patient Name: Date of Service: [place], [name] 12/19/2020 1:15 PM Medical Record Number: [id number] Patient Account Number: [id number] Date of Birth/Sex: Treating RN: 25-Mar-1932 (87 y.o. [name] Primary Care Provider: [name] Provider: Treating Provider/Extender: Bebe Shaggy in Treatment: 0 Active Problems ICD-10 Encounter Code Description Active Date MDM Diagnosis I89.0 Lymphedema, not elsewhere classified 12/19/2020 No Yes I87.331 Chronic venous hypertension (idiopathic) with ulcer and inflammation of right 12/19/2020 No Yes lower extremity L97.811 Non-pressure chronic ulcer of other part of right lower leg limited to breakdown 12/19/2020 No Yes of skin Inactive  Problems Resolved Problems Electronic Signature(s) Signed: 12/19/2020 5:01:02 PM By: Linton Ham MD Entered By: Linton Ham on 12/19/2020 14:06:35 -------------------------------------------------------------------------------- Progress Note Details Patient Name: Date of Service: [name], [name] 12/19/2020 1:15 PM Medical Record Number: [id number] Patient Account Number: [id number] Date of Birth/Sex: Treating RN: 02-01-1932 (87 y.o. [name] Primary Care Provider: [name] Provider: Treating Provider/Extender: Bebe Shaggy in Treatment: 0 Subjective Chief Complaint Information obtained from Patient  12/19/2020; patient is here for review of wounds on his right anterior lower History of Present Illness (HPI) ADMISSION 12/19/2020 This is a 87 year old man who arrives accompanied by his son who used to be a patient in this clinic. He has had chronic wounds on the right anterior lower leg which have been opening and closing on and off for the last 7 to 8 months. He says these will blister then opened up then closed and blister opened up and closed. He has [facility] and I think they have been applying alginate and 3 layer wraps however apparently the 3 layer light wrap was not put on properly. He is also had compression stockings but does not feel he can get these on. Past medical history includes COPD on chronic oxygen, pulmonary hypertension, obstructive sleep apnea, type 2 diabetes, diastolic heart failure, chronic kidney disease stage III and atrial fibrillation on Eliquis ABI in our clinic was 1.04 on the right Patient History Information obtained from Patient. Allergies No Known Allergies Family History Unknown History. Social History Former smoker - quit 20 years ago, Alcohol Use - Never, Drug Use - No History, Caffeine Use - Rarely. Medical History Respiratory Patient has history of Chronic Obstructive  Pulmonary Disease (COPD), Sleep Apnea Cardiovascular Patient has history of Arrhythmia - A-Fib, Congestive Heart Failure, Hypertension, Myocardial Infarction - 25 years ago, Peripheral Venous Disease Endocrine Patient has history of Type II Diabetes Patient is treated with Insulin, Oral Agents. Blood sugar is tested. Medical A Surgical History Notes nd Genitourinary CKD stage 3 Review of Systems (ROS) Constitutional Symptoms (General Health) Denies complaints or symptoms of Fatigue, Fever, Chills, Marked Weight Change. Eyes Denies complaints or symptoms of Dry Eyes, Vision Changes, Glasses / Contacts. Ear/Nose/Mouth/Throat Denies complaints or symptoms of Chronic sinus problems or rhinitis. Gastrointestinal Denies complaints or symptoms of Frequent diarrhea, Nausea, Vomiting. Integumentary (Skin) Complains or has symptoms of Wounds - wounds on right lower legs. Neurologic Denies complaints or symptoms of Numbness/parasthesias. Psychiatric Denies complaints or symptoms of Claustrophobia, Suicidal. Objective Constitutional Patient is hypertensive.. Pulse regular and within target range for patient.Marland Kitchen Respirations regular, non-labored and within target range.. Temperature is normal and within the target range for the patient.Marland Kitchen Appears in no distress. Vitals Time Taken: 1:26 PM, Height: 69 in, Source: Stated, Weight: 190 lbs, Source: Stated, BMI: 28.1, Temperature: 98.9 F, Pulse: 88 bpm, Respiratory Rate: 20 breaths/min, Blood Pressure: 173/88 mmHg, Capillary Blood Glucose: 92 mg/dl. Respiratory work of breathing is normal. Cardiovascular Pedal pulses on the right are palpable so is his femoral pulse although I had trouble feeling the popliteal. Severe bilateral lymphedema. Skin changes related to chronic lymphedema. Lipodermatosclerosis of the skin involving the distal right lower leg. General Notes: Wound exam; the patient has innumerable small open areas on the right anterior  tibial area. None of these look particularly ominous surface is clean. He does not have an arterial issue. On the left leg he has severe edema as well with nodular changes present likely reflecting chronic lymphedema type changes Integumentary (Hair, Skin) Wound #1 status is Open. Original cause of wound was Gradually Appeared. The wound is located on the Right,Anterior Lower Leg. The wound measures 11cm length x 7cm width x 0.1cm depth; 60.476cm^2 area and 6.048cm^3 volume. There is Fat Layer (Subcutaneous Tissue) exposed. There is no tunneling or undermining noted. There is a medium amount of serosanguineous drainage noted. The wound margin is flat and intact. There is large (67-100%) red granulation within the wound bed. There is  no necrotic tissue within the wound bed. Assessment Active Problems ICD-10 Lymphedema, not elsewhere classified Chronic venous hypertension (idiopathic) with ulcer and inflammation of right lower extremity Non-pressure chronic ulcer of other part of right lower leg limited to breakdown of skin Procedures Wound #1 Pre-procedure diagnosis of Wound #1 is a Venous Leg Ulcer located on the Right,Anterior Lower Leg . There was a Three Layer Compression Therapy Procedure with a pre-treatment ABI of 1 by Baruch Gouty, RN. Post procedure Diagnosis Wound #1: Same as Pre-Procedure Plan Follow-up Appointments: Return Appointment in 1 week. Bathing/ Shower/ Hygiene: May shower with protection but do not get wound dressing(s) wet. - when not changing the dressing. May shower and wash wound with soap and water. - with dressing changes only. Edema Control - Lymphedema / SCD / Other: Elevate legs to the level of the heart or above for 30 minutes daily and/or when sitting, a frequency of: - throughout the day 3-4 times a day. Avoid standing for long periods of time. Patient to wear own compression stockings every day. - Apply compression stocking to left leg in the morning  and remove at night. Apply lotion every night after removing stocking. Exercise regularly Home Health: New wound care orders this week; continue Home Health for wound care. May utilize formulary equivalent dressing for wound treatment orders unless otherwise specified. - [facility] to change twice a week. Wound Center weekly. WOUND #1: - Lower Leg Wound Laterality: Right, Anterior Cleanser: Soap and Water (Home Health) 3 x Per Week/30 Days Discharge Instructions: Home Health to wash leg with soap and water with dressing changes. May shower and wash wound with dial antibacterial soap and water prior to dressing change. Peri-Wound Care: Triamcinolone 15 (g) 3 x Per Week/30 Days Discharge Instructions: In clinic apply liberally. Use triamcinolone 15 (g) as directed Peri-Wound Care: Zinc Oxide Ointment 30g tube (Home Health) 3 x Per Week/30 Days Discharge Instructions: Apply Zinc Oxide as needed for maceration, wetness to periwound with each dressing change Peri-Wound Care: Sween Lotion (Moisturizing lotion) (Home Health) 3 x Per Week/30 Days Discharge Instructions: home health to Apply moisturizing lotion as directed Prim Dressing: KerraCel Ag Gelling Fiber Dressing, 4x5 in (silver alginate) (Home Health) 3 x Per Week/30 Days ary Discharge Instructions: Apply silver alginate to wound bed as instructed Secondary Dressing: Woven Gauze Sponge, Non-Sterile 4x4 in (Home Health) 3 x Per Week/30 Days Discharge Instructions: Apply over primary dressing as directed. Secondary Dressing: ABD Pad, 8x10 (Home Health) 3 x Per Week/30 Days Discharge Instructions: Apply over primary dressing as directed. Compression Wrap: ThreePress (3 layer compression wrap) (Home Health) 3 x Per Week/30 Days Discharge Instructions: Apply three layer compression as directed. Apply just above toes and just below the knee (tibial tuberosity). 1. We put silver alginate on the wounds on the anterior right leg. Put him  in 3 layer compression. 2. We will see if the 3 layer compression is adequate to get enough edema control proximal to the wound area to prevent reopening. 3. He is going to require external compression stockings of some form. If he has difficulty getting them on then we can talk about the issues here but I did talk with his son it is possible he will need family assistance. 4. We put Liberal steroid and moisturizer on the skin on the right anterior lower leg which is dry and scaly. I spent 35 minutes in review of this patient's past medical history, face-to-face evaluation and preparation of this record Electronic Signature(s)  Signed: 12/19/2020 5:01:02 PM By: Linton Ham MD Entered By: Linton Ham on 12/19/2020 14:31:14 -------------------------------------------------------------------------------- HxROS Details Patient Name: Date of Service: [name], [name] 12/19/2020 1:15 PM Medical Record Number: [id number] Patient Account Number: [id number] Date of Birth/Sex: Treating RN: [birth year] (87 y.o. [name] Primary Care Provider: [name] Provider: Treating Provider/Extender: Bebe Shaggy in Treatment: 0 Information Obtained From Patient Constitutional Symptoms (General Health) Complaints and Symptoms: Negative for: Fatigue; Fever; Chills; Marked Weight Change Eyes Complaints and Symptoms: Negative for: Dry Eyes; Vision Changes; Glasses / Contacts Ear/Nose/Mouth/Throat Complaints and Symptoms: Negative for: Chronic sinus problems or rhinitis Gastrointestinal Complaints and Symptoms: Negative for: Frequent diarrhea; Nausea; Vomiting Integumentary (Skin) Complaints and Symptoms: Positive for: Wounds - wounds on right lower legs Neurologic Complaints and Symptoms: Negative for: Numbness/parasthesias Psychiatric Complaints and Symptoms: Negative for: Claustrophobia;  Suicidal Hematologic/Lymphatic Respiratory Medical History: Positive for: Chronic Obstructive Pulmonary Disease (COPD); Sleep Apnea Cardiovascular Medical History: Positive for: Arrhythmia - A-Fib; Congestive Heart Failure; Hypertension; Myocardial Infarction - 25 years ago; Peripheral Venous Disease Endocrine Medical History: Positive for: Type II Diabetes Treated with: Insulin, Oral agents Blood sugar tested every day: Yes Tested : 2 times a day Genitourinary Medical History: Past Medical History Notes: CKD stage 3 Immunological Musculoskeletal Oncologic Immunizations Pneumococcal Vaccine: Received Pneumococcal Vaccination: Yes Implantable Devices None Family and Social History Unknown History: Yes; Former smoker - quit 20 years ago; Alcohol Use: Never; Drug Use: No History; Caffeine Use: Rarely; Financial Concerns: No; Food, Clothing or Shelter Needs: No; Support System Lacking: No; Transportation Concerns: No Engineer, maintenance) Signed: 12/19/2020 5:01:02 PM By: Linton Ham MD Signed: 12/19/2020 5:28:50 PM By: Levan Hurst RN, BSN Entered By: Levan Hurst on 12/19/2020 13:35:34 -------------------------------------------------------------------------------- [place] Details Patient Name: Date of Service: [name], [name] 12/19/2020 Medical Record Number: [id number] Patient Account Number: [id number] Date of Birth/Sex: Treating RN: Jan 06, 1932 (87 y.o. Lorette Ang, Meta.Reding Primary Care Provider: [name] Provider: Treating Provider/Extender: Bebe Shaggy in Treatment: 0 Diagnosis Coding ICD-10 Codes Code Description I89.0 Lymphedema, not elsewhere classified I87.331 Chronic venous hypertension (idiopathic) with ulcer and inflammation of right lower extremity L97.811 Non-pressure chronic ulcer of other part of right lower leg limited to breakdown of skin Facility Procedures CPT4 Code: 58099833 Description:  82505 - WOUND CARE VISIT-LEV 4 EST PT Modifier: Quantity: 1 CPT4 Code: [id number] Description: (Facility Use Only) [id number] - [place] COMPRS LWR RT LEG Modifier: Quantity: 1 Physician Procedures : CPT4 Code Description Modifier [id number] [place] PHYS LEVEL 3 NEW PT ICD-10 Diagnosis Description I89.0 Lymphedema, not elsewhere classified I87.331 Chronic venous hypertension (idiopathic) with ulcer and inflammation of right lower extremity L97.811  Non-pressure chronic ulcer of other part of right lower leg limited to breakdown of skin Quantity: 1 Electronic Signature(s) Signed: 12/19/2020 5:01:02 PM By: Linton Ham MD Entered By: Linton Ham on 12/19/2020 14:31:46

## 2020-12-19 NOTE — Progress Notes (Signed)
DELWIN, RACZKOWSKI ([id number]) Visit Report for 12/19/2020 Allergy List Details Patient Name: Date of Service: [place], [name] 12/19/2020 1:15 PM Medical Record Number: [id number] Patient Account Number: [id number] Date of Birth/Sex: Treating RN: [birth year] (87 y.o. Ernestene Mention Primary Care Dailin Sosnowski: [name] Ossiel Marchio: Treating Marlea Gambill/Extender: Bebe Shaggy in Treatment: 0 Allergies Active Allergies No Known Allergies Type: Allergen Allergy Notes Electronic Signature(s) Signed: 12/19/2020 5:40:31 PM By: Baruch Gouty RN, BSN Entered By: Baruch Gouty on 12/19/2020 13:21:29 -------------------------------------------------------------------------------- Arrival Information Details Patient Name: Date of Service: [place], [place] [name] 12/19/2020 1:15 PM Medical Record Number: [id number] Patient Account Number: [id number] Date of Birth/Sex: Treating RN: 04/11/1932 (87 y.o. Ernestene Mention Primary Care Ademide Schaberg: [name] Tecla Mailloux: Treating Turquoise Esch/Extender: Bebe Shaggy in Treatment: 0 Visit Information Patient Arrived: Ambulatory Arrival Time: 13:16 Accompanied By: son Transfer Assistance: None Patient Identification Verified: Yes Secondary Verification Process Completed: Yes Patient Requires Transmission-Based Precautions: No Patient Has Alerts: Yes Patient Alerts: Patient on Blood Thinner Electronic Signature(s) Signed: 12/19/2020 5:28:50 PM By: Levan Hurst RN, BSN Entered By: Levan Hurst on 12/19/2020 14:34:53 -------------------------------------------------------------------------------- Clinic Level of Care Assessment Details Patient Name: Date of Service: [place], [place] [name] 12/19/2020 1:15 PM Medical Record Number: [id number] Patient Account Number: [id number] Date of Birth/Sex: Treating RN: 16-Jun-1932 (87 y.o. [name] Primary Care Khyree Carillo: [name]  Other Clinician: Referring Muhannad Bignell: Treating Ashonti Leandro/Extender: Bebe Shaggy in Treatment: 0 Clinic Level of Care Assessment Items TOOL 1 Quantity Score X- 1 0 Use when EandM and Procedure is performed on INITIAL visit ASSESSMENTS - Nursing Assessment / Reassessment X- 1 20 General Physical Exam (combine w/ comprehensive assessment (listed just below) when performed on new pt. evals) X- 1 25 Comprehensive Assessment (HX, ROS, Risk Assessments, Wounds Hx, etc.) ASSESSMENTS - Wound and Skin Assessment / Reassessment X- 1 10 Dermatologic / Skin Assessment (not related to wound area) ASSESSMENTS - Ostomy and/or Continence Assessment and Care []  - 0 Incontinence Assessment and Management []  - 0 Ostomy Care Assessment and Management (repouching, etc.) PROCESS - Coordination of Care X - Simple Patient / Family Education for ongoing care 1 15 []  - 0 Complex (extensive) Patient / Family Education for ongoing care X- 1 10 Staff obtains Programmer, systems, Records, T Results / Process Orders est X- 1 10 Staff telephones HHA, Nursing Homes / Clarify orders / etc []  - 0 Routine Transfer to another Facility (non-emergent condition) []  - 0 Routine Hospital Admission (non-emergent condition) X- 1 15 New Admissions / Biomedical engineer / Ordering NPWT Apligraf, etc. , []  - 0 Emergency Hospital Admission (emergent condition) PROCESS - Special Needs []  - 0 Pediatric / Minor Patient Management []  - 0 Isolation Patient Management []  - 0 Hearing / Language / Visual special needs []  - 0 Assessment of Community assistance (transportation, D/C planning, etc.) []  - 0 Additional assistance / Altered mentation []  - 0 Support Surface(s) Assessment (bed, cushion, seat, etc.) INTERVENTIONS - Miscellaneous []  - 0 External ear exam []  - 0 Patient Transfer (multiple staff / Civil Service fast streamer / Similar devices) []  - 0 Simple Staple / Suture removal (25 or less) []  - 0 Complex  Staple / Suture removal (26 or more) []  - 0 Hypo/Hyperglycemic Management (do not check if billed separately) X- 1 15 Ankle / Brachial Index (ABI) - do not check if billed separately Has the patient been seen at the hospital within the last three  years: Yes Total Score: 120 Level Of Care: New/Established - Level 4 Electronic Signature(s) Signed: 12/19/2020 5:39:22 PM By: [name] Entered By: [name] on 12/19/2020 14:02:41 -------------------------------------------------------------------------------- Compression Therapy Details Patient Name: Date of Service: [name], [place] [name] 12/19/2020 1:15 PM Medical Record Number: [id number] Patient Account Number: [id number] Date of Birth/Sex: Treating RN: [birth year] (87 y.o. [name] Primary Care [name]: [name] Raylynne Cubbage: Treating Merrick Feutz/Extender: Bebe Shaggy in Treatment: 0 Compression Therapy Performed for Wound Assessment: Wound #1 Right,Anterior Lower Leg Performed By: Clinician Baruch Gouty, RN Compression Type: Three Layer Pre Treatment ABI: 1 Post Procedure Diagnosis Same as Pre-procedure Electronic Signature(s) Signed: 12/19/2020 5:39:22 PM By: [name] Entered By: [name] on 12/19/2020 14:02:14 -------------------------------------------------------------------------------- Encounter Discharge Information Details Patient Name: Date of Service: [place], [place] [name] 12/19/2020 1:15 PM Medical Record Number: [id number] Patient Account Number: [id number] Date of Birth/Sex: Treating RN: 01/30/1932 (87 y.o. Ernestene Mention Primary Care Nechelle Petrizzo: [name] Evelisse Szalkowski: Treating Edwardo Wojnarowski/Extender: Bebe Shaggy in Treatment: 0 Encounter Discharge Information Items Discharge Condition: Stable Ambulatory Status: Ambulatory Discharge Destination: Home Transportation: Private Auto Accompanied By:  son Schedule Follow-up Appointment: Yes Clinical Summary of Care: Patient Declined Electronic Signature(s) Signed: 12/19/2020 5:40:31 PM By: Baruch Gouty RN, BSN Entered By: Baruch Gouty on 12/19/2020 14:17:30 -------------------------------------------------------------------------------- Lower Extremity Assessment Details Patient Name: Date of Service: [place], [place] [name] 12/19/2020 1:15 PM Medical Record Number: [id number] Patient Account Number: [id number] Date of Birth/Sex: Treating RN: 05-Jun-1932 (87 y.o. [name] Primary Care Tarry Blayney: [name] Yajayra Feldt: Treating Kyndell Zeiser/Extender: Bebe Shaggy in Treatment: 0 Edema Assessment Assessed: [name]: No] [name]: No] Edema: [Left: Ye] [Right: s] Calf Left: Right: Point of Measurement: From Medial Instep 34.3 cm Ankle Left: Right: Point of Measurement: From Medial Instep 21.5 cm Vascular Assessment Pulses: Dorsalis Pedis Palpable: [Right:Yes] Blood Pressure: Brachial: [Right:173] Ankle: [Right:Dorsalis Pedis: 180 1.04] Electronic Signature(s) Signed: 12/19/2020 5:28:50 PM By: Levan Hurst RN, BSN Entered By: Levan Hurst on 12/19/2020 13:45:02 -------------------------------------------------------------------------------- Multi Wound Chart Details Patient Name: Date of Service: [name], [place] [name] 12/19/2020 1:15 PM Medical Record Number: [id number] Patient Account Number: [id number] Date of Birth/Sex: Treating RN: Aug 10, 1932 (87 y.o. Lorette Ang, Meta.Reding Primary Care Larnie Heart: [name] Rumi Kolodziej: Treating [place]/Extender: Bebe Shaggy in Treatment: 0 Vital Signs Height(in): 40 Capillary Blood Glucose(mg/dl): 92 Weight(lbs): 190 Pulse(bpm): 59 Body Mass Index(BMI): 28 Blood Pressure(mmHg): 173/88 Temperature(F): 98.9 Respiratory Rate(breaths/min): 20 Photos: [1:No Photos Right, Anterior Lower Leg]  [N/A:N/A N/A] Wound Location: [1:Gradually Appeared] [N/A:N/A] Wounding Event: [1:Venous Leg Ulcer] [N/A:N/A] Primary Etiology: [1:Chronic Obstructive Pulmonary] [N/A:N/A] Comorbid History: [1:Disease (COPD), Sleep Apnea, Arrhythmia, Congestive Heart Failure, Hypertension, Myocardial Infarction, Peripheral Venous Disease, Type II Diabetes 10/07/2020] [N/A:N/A] Date Acquired: [1:0] [N/A:N/A] Weeks of Treatment: [1:Open] [N/A:N/A] Wound Status: [1:11x7x0.1] [N/A:N/A] Measurements L x W x D (cm) [1:60.476] [N/A:N/A] A (cm) : rea [1:6.048] [N/A:N/A] Volume (cm) : [1:Full Thickness Without Exposed] [N/A:N/A] Classification: [1:Support Structures Medium] [N/A:N/A] Exudate Amount: [1:Serosanguineous] [N/A:N/A] Exudate Type: [1:red, brown] [N/A:N/A] Exudate Color: [1:Flat and Intact] [N/A:N/A] Wound Margin: [1:Large (67-100%)] [N/A:N/A] Granulation Amount: [1:Red] [N/A:N/A] Granulation Quality: [1:None Present (0%)] [N/A:N/A] Necrotic Amount: [1:Fat Layer (Subcutaneous Tissue): Yes N/A] Exposed Structures: [1:Fascia: No Tendon: No Muscle: No Joint: No Bone: No None] [N/A:N/A] Epithelialization: [1:Compression Therapy] [N/A:N/A] Treatment Notes Wound #1 (Lower Leg) Wound Laterality: Right, Anterior Cleanser Soap and Water Discharge Instruction: Home  Health to wash leg with soap and water with dressing changes. May shower and wash wound with dial antibacterial soap and water prior to dressing change. Peri-Wound Care Triamcinolone 15 (g) Discharge Instruction: In clinic apply liberally. Use triamcinolone 15 (g) as directed Zinc Oxide Ointment 30g tube Discharge Instruction: Apply Zinc Oxide as needed for maceration, wetness to periwound with each dressing change Sween Lotion (Moisturizing lotion) Discharge Instruction: home health to Apply moisturizing lotion as directed Topical Primary Dressing KerraCel Ag Gelling Fiber Dressing, 4x5 in (silver alginate) Discharge Instruction: Apply  silver alginate to wound bed as instructed Secondary Dressing Woven Gauze Sponge, Non-Sterile 4x4 in Discharge Instruction: Apply over primary dressing as directed. ABD Pad, 8x10 Discharge Instruction: Apply over primary dressing as directed. Secured With Compression Wrap ThreePress (3 layer compression wrap) Discharge Instruction: Apply three layer compression as directed. Apply just above toes and just below the knee (tibial tuberosity). Compression Stockings Add-Ons Electronic Signature(s) Signed: 12/19/2020 5:01:02 PM By: Linton Ham MD Signed: 12/19/2020 5:39:22 PM By: [name] Entered By: Linton Ham on 12/19/2020 14:25:30 -------------------------------------------------------------------------------- Multi-Disciplinary Care Plan Details Patient Name: Date of Service: [place], [place] [name] 12/19/2020 1:15 PM Medical Record Number: [id number] Patient Account Number: [id number] Date of Birth/Sex: Treating RN: 23-Jan-1932 (87 y.o. Lorette Ang, Meta.Reding Primary Care Janeese Mcgloin: [name] Ethin Drummond: Treating Gerrald Basu/Extender: Bebe Shaggy in Treatment: 0 Active Inactive Abuse / Safety / Falls / Self Care Management Nursing Diagnoses: Potential for falls Goals: Patient will remain injury free related to falls Date Initiated: 12/19/2020 Target Resolution Date: 01/31/2021 Goal Status: Active Interventions: Assess Activities of Daily Living upon admission and as needed Assess fall risk on admission and as needed Notes: Nutrition Nursing Diagnoses: Potential for alteratiion in Nutrition/Potential for imbalanced nutrition Goals: Patient/caregiver agrees to and verbalizes understanding of need to obtain nutritional consultation Date Initiated: 12/19/2020 Target Resolution Date: 01/10/2021 Goal Status: Active Patient/caregiver will maintain therapeutic glucose control Date Initiated: 12/19/2020 Target Resolution Date:  01/31/2021 Goal Status: Active Interventions: Assess HgA1c results as ordered upon admission and as needed Provide education on elevated blood sugars and impact on wound healing Provide education on nutrition Treatment Activities: Obtain HgA1c : 12/19/2020 Notes: Orientation to the Wound Care Program Nursing Diagnoses: Knowledge deficit related to the wound healing center program Goals: Patient/caregiver will verbalize understanding of the [place] Date Initiated: 12/19/2020 Target Resolution Date: 01/09/2021 Goal Status: Active Interventions: Provide education on orientation to the wound center Notes: Wound/Skin Impairment Nursing Diagnoses: Knowledge deficit related to ulceration/compromised skin integrity Goals: Patient/caregiver will verbalize understanding of skin care regimen Date Initiated: 12/19/2020 Target Resolution Date: 02/14/2021 Goal Status: Active Interventions: Assess patient/caregiver ability to obtain necessary supplies Assess patient/caregiver ability to perform ulcer/skin care regimen upon admission and as needed Provide education on ulcer and skin care Treatment Activities: Skin care regimen initiated : 12/19/2020 Topical wound management initiated : 12/19/2020 Notes: Electronic Signature(s) Signed: 12/19/2020 5:39:22 PM By: [name] Entered By: [name] on 12/19/2020 13:41:53 -------------------------------------------------------------------------------- Pain Assessment Details Patient Name: Date of Service: [name], [place] [name] 12/19/2020 1:15 PM Medical Record Number: [id number] Patient Account Number: [id number] Date of Birth/Sex: Treating RN: 12-Apr-1932 (87 y.o. [name] Primary Care Isaias Dowson: [name] Taegen Delker: Treating Rainee Sweatt/Extender: Bebe Shaggy in Treatment: 0 Active Problems Location of Pain Severity and Description of Pain Patient Has Paino No Site  Locations Pain Management and Medication Current Pain Management: Electronic Signature(s) Signed: 12/19/2020 5:28:50 PM  By: Levan Hurst RN, BSN Entered By: Levan Hurst on 12/19/2020 13:38:55 -------------------------------------------------------------------------------- Patient/Caregiver Education Details Patient Name: Date of Service: [name], [place] [name] 1/13/2022andnbsp1:15 PM Medical Record Number: [id number] Patient Account Number: [id number] Date of Birth/Gender: Treating RN: 07-25-1932 (87 y.o. [name] Primary Care Physician: [name] Physician: Treating Physician/Extender: Bebe Shaggy in Treatment: 0 Education Assessment Education Provided To: Patient Education Topics Provided Elevated Blood Sugar/ Impact on Healing: Handouts: Elevated Blood Sugars: How Do They Affect Wound Healing Methods: Explain/Verbal, Printed Responses: Reinforcements needed [place]: o Handouts: Welcome T [facility] o Methods: Explain/Verbal, Printed Responses: Reinforcements needed Wound/Skin Impairment: Handouts: Caring for Your Ulcer, Skin Care Do's and Dont's Methods: Explain/Verbal, Printed Responses: Reinforcements needed Electronic Signature(s) Signed: 12/19/2020 5:39:22 PM By: [name] Entered By: [name] on 12/19/2020 13:42:37 -------------------------------------------------------------------------------- Wound Assessment Details Patient Name: Date of Service: [name], [place] [name] 12/19/2020 1:15 PM Medical Record Number: [id number] Patient Account Number: [id number] Date of Birth/Sex: Treating RN: [birth year] (87 y.o. [name] Primary Care Maddelyn Rocca: [name] Lynsi Dooner: Treating [place] Duquette/Extender: Bebe Shaggy in Treatment: 0 Wound Status Wound Number: 1 Primary Venous Leg Ulcer Etiology: Wound Location: Right,  Anterior Lower Leg Wound Open Wounding Event: Gradually Appeared Status: Date Acquired: 10/07/2020 Comorbid Chronic Obstructive Pulmonary Disease (COPD), Sleep Apnea, Weeks Of Treatment: 0 History: Arrhythmia, Congestive Heart Failure, Hypertension, Myocardial Clustered Wound: No Infarction, Peripheral Venous Disease, Type II Diabetes Wound Measurements Length: (cm) 11 Width: (cm) 7 Depth: (cm) 0.1 Area: (cm) 60.476 Volume: (cm) 6.048 % Reduction in Area: % Reduction in Volume: Epithelialization: None Tunneling: No Undermining: No Wound Description Classification: Full Thickness Without Exposed Support Structures Wound Margin: Flat and Intact Exudate Amount: Medium Exudate Type: Serosanguineous Exudate Color: red, brown Foul Odor After Cleansing: No Slough/Fibrino No Wound Bed Granulation Amount: Large (67-100%) Exposed Structure Granulation Quality: Red Fascia Exposed: No Necrotic Amount: None Present (0%) Fat Layer (Subcutaneous Tissue) Exposed: Yes Tendon Exposed: No Muscle Exposed: No Joint Exposed: No Bone Exposed: No Treatment Notes Wound #1 (Lower Leg) Wound Laterality: Right, Anterior Cleanser Soap and Water Discharge Instruction: Home Health to wash leg with soap and water with dressing changes. May shower and wash wound with dial antibacterial soap and water prior to dressing change. Peri-Wound Care Triamcinolone 15 (g) Discharge Instruction: In clinic apply liberally. Use triamcinolone 15 (g) as directed Zinc Oxide Ointment 30g tube Discharge Instruction: Apply Zinc Oxide as needed for maceration, wetness to periwound with each dressing change Sween Lotion (Moisturizing lotion) Discharge Instruction: home health to Apply moisturizing lotion as directed Topical Primary Dressing KerraCel Ag Gelling Fiber Dressing, 4x5 in (silver alginate) Discharge Instruction: Apply silver alginate to wound bed as instructed Secondary Dressing Woven Gauze Sponge,  Non-Sterile 4x4 in Discharge Instruction: Apply over primary dressing as directed. ABD Pad, 8x10 Discharge Instruction: Apply over primary dressing as directed. Secured With Compression Wrap ThreePress (3 layer compression wrap) Discharge Instruction: Apply three layer compression as directed. Apply just above toes and just below the knee (tibial tuberosity). Compression Stockings Add-Ons Electronic Signature(s) Signed: 12/19/2020 5:28:50 PM By: Levan Hurst RN, BSN Entered By: Levan Hurst on 12/19/2020 13:38:41 -------------------------------------------------------------------------------- [place] Details Patient Name: Date of Service: [name], [place] [name] 12/19/2020 1:15 PM Medical Record Number: [id number] Patient Account Number: [id number] Date of Birth/Sex: Treating RN: March 28, 1932 (87 y.o. Ernestene Mention Primary Care Annaly Skop: [name] Annie Saephan:  Treating Zahria Ding/Extender: Bebe Shaggy in Treatment: 0 Vital Signs Time Taken: 13:26 Temperature (F): 98.9 Height (in): 69 Pulse (bpm): 88 Source: Stated Respiratory Rate (breaths/min): 20 Weight (lbs): 190 Blood Pressure (mmHg): 173/88 Source: Stated Capillary Blood Glucose (mg/dl): 92 Body Mass Index (BMI): 28.1 Reference Range: 80 - 120 mg / dl Electronic Signature(s) Signed: 12/19/2020 5:28:50 PM By: Levan Hurst RN, BSN Entered By: Levan Hurst on 12/19/2020 13:30:13

## 2020-12-19 NOTE — Progress Notes (Signed)
[name], [name] ([id number]) Visit Report for 12/19/2020 Abuse/Suicide Risk Screen Details Patient Name: Date of Service: [name], [name] 12/19/2020 1:15 PM Medical Record Number: [id number] Patient Account Number: [id number] Date of Birth/Sex: Treating RN: 1932-03-25 (87 y.o. [name] [name] Other Clinician: Referring [name]: Treating [name]: [name] in Treatment: 0 Abuse/Suicide Risk Screen Items Answer ABUSE RISK SCREEN: Has anyone close to you tried to hurt or harm you recentlyo No Do you feel uncomfortable with anyone in your familyo No Has anyone forced you do things that you didnt want to doo No Electronic Signature(s) Signed: 12/19/2020 5:28:50 PM By: Joseph Hurst RN, BSN Entered By: [name] on 12/19/2020 13:35:38 -------------------------------------------------------------------------------- Activities of Daily Living Details Patient Name: Date of Service: [name], [name] 12/19/2020 1:15 PM Medical Record Number: [id number] Patient Account Number: [id number] Date of Birth/Sex: Treating RN: 25-May-1932 (87 y.o. [name] Lyon Dumont: [name] Other Clinician: Referring Markevious Ehmke: Treating Sidnie Swalley/Extender: [name] in Treatment: 0 Activities of Daily Living Items Answer Activities of Daily Living (Please select one for each item) Drive Automobile Completely Able T Medications ake Completely Able Use T elephone Completely Able [name] for Appearance Completely Able Use T oilet Completely Able Bath / Shower Completely Able Dress Self Completely Able Feed Self Completely Able Walk Completely Able Get In / Out Bed Completely Able Housework Completely Able Prepare Meals Completely Able Handle Money Completely Able Shop for Self Completely Able Electronic Signature(s) Signed: 12/19/2020 5:28:50 PM By: Joseph Hurst RN, BSN Entered By: [name] on 12/19/2020 13:36:04 -------------------------------------------------------------------------------- Education Screening Details Patient Name: Date of Service: [name], [name] 12/19/2020 1:15 PM Medical Record Number: [id number] Patient Account Number: [id number] Date of Birth/Sex: Treating RN: 04-11-1932 (87 y.o. [name] Tinaya Ceballos: [name] Other Clinician: Referring Ashely Goosby: Treating Jamilett Ferrante/Extender: [name] in Treatment: 0 Primary Learner Assessed: Patient Learning Preferences/Education Level/Primary Language Learning Preference: Explanation, Demonstration, Printed Material Highest Education Level: High School Preferred Language: English Cognitive Barrier Language Barrier: No Translator Needed: No Memory Deficit: No Emotional Barrier: No Cultural/Religious Beliefs Affecting Medical [name]: No Physical Barrier Impaired Vision: No Impaired Hearing: No Decreased Hand dexterity: No Knowledge/Comprehension Knowledge Level: High Comprehension Level: High Ability to understand written instructions: High Ability to understand verbal instructions: High Motivation Anxiety Level: Calm Cooperation: Cooperative Education Importance: Acknowledges Need Interest in Health Problems: Asks Questions Perception: Coherent Willingness to Engage in Self-Management High Activities: Readiness to Engage in Self-Management High Activities: Electronic Signature(s) Signed: 12/19/2020 5:28:50 PM By: Joseph Hurst RN, BSN Entered By: [name] on 12/19/2020 13:36:21 -------------------------------------------------------------------------------- Fall Risk Assessment Details Patient Name: Date of Service: [name], [name] 12/19/2020 1:15 PM Medical Record Number: [id number] Patient Account Number: [id number] Date of Birth/Sex: Treating RN: [birth year] (88 y.o. [name] Zoraya Fiorenza: [name] Other  Clinician: Referring Henrine Hayter: Treating Saniya Tranchina/Extender: [name] in Treatment: 0 Fall Risk Assessment Items Have you had 2 or more falls in the last 12 monthso 0 No Have you had any fall that resulted in injury in the last 12 monthso 0 No FALLS RISK SCREEN History of falling - immediate or within 3 months 0 No Secondary diagnosis (Do you have 2 or more medical diagnoseso) 15 Yes Ambulatory aid None/bed rest/wheelchair/nurse 0 No Crutches/cane/walker 15 Yes Furniture 0 No Intravenous therapy Access/Saline/Heparin Lock 0 No Gait/Transferring Normal/ bed rest/ wheelchair 0 Yes  Weak (short steps with or without shuffle, stooped but able to lift head while walking, may seek 0 No support from furniture) Impaired (short steps with shuffle, may have difficulty arising from chair, head down, impaired 0 No balance) Mental Status Oriented to own ability 0 Yes Electronic Signature(s) Signed: 12/19/2020 5:28:50 PM By: Joseph Hurst RN, BSN Entered By: [name] on 12/19/2020 13:36:47 -------------------------------------------------------------------------------- Foot Assessment Details Patient Name: Date of Service: [name], [name] 12/19/2020 1:15 PM Medical Record Number: [id number] Patient Account Number: [id number] Date of Birth/Sex: Treating RN: 04-22-1932 (87 y.o. [name] Makesha Belitz: [name] Other Clinician: Referring Wells Gerdeman: Treating Labrisha Wuellner/Extender: [name] in Treatment: 0 Foot Assessment Items Site Locations + = Sensation present, - = Sensation absent, C = Callus, U = Ulcer R = Redness, W = Warmth, M = Maceration, PU = Pre-ulcerative lesion F = Fissure, S = Swelling, D = Dryness Assessment Right: Left: Other Deformity: No No Prior Foot Ulcer: No No Prior Amputation: No No Charcot Joint: No No Ambulatory Status: Ambulatory Without Help Gait: Steady Electronic Signature(s) Signed:  12/19/2020 5:28:50 PM By: Joseph Hurst RN, BSN Entered By: [name] on 12/19/2020 13:40:15 -------------------------------------------------------------------------------- Nutrition Risk Screening Details Patient Name: Date of Service: [name], [name] 12/19/2020 1:15 PM Medical Record Number: [id number] Patient Account Number: [id number] Date of Birth/Sex: Treating RN: 1931-12-11 (87 y.o. [name] Jeramia Saleeby: [name] Other Clinician: Referring Senovia Gauer: Treating Bernette Seeman/Extender: [name] in Treatment: 0 Height (in): 69 Weight (lbs): 190 Body Mass Index (BMI): 28.1 Nutrition Risk Screening Items Score Screening NUTRITION RISK SCREEN: I have an illness or condition that made me change the kind and/or amount of food I eat 2 Yes I eat fewer than two meals per day 0 No I eat few fruits and vegetables, or milk products 0 No I have three or more drinks of beer, liquor or wine almost every day 0 No I have tooth or mouth problems that make it hard for me to eat 0 No I don't always have enough money to buy the food I need 0 No I eat alone most of the time 0 No I take three or more different prescribed or over-the-counter drugs a day 1 Yes Without wanting to, I have lost or gained 10 pounds in the last six months 0 No I am not always physically able to shop, cook and/or feed myself 2 Yes Nutrition Protocols Good Risk Protocol Moderate Risk Protocol High Risk Proctocol Risk Level: Moderate Risk Score: 5 Electronic Signature(s) Signed: 12/19/2020 5:28:50 PM By: Joseph Hurst RN, BSN Entered By: [name] on 12/19/2020 13:36:54

## 2020-12-26 ENCOUNTER — Other Ambulatory Visit: Payer: Self-pay

## 2020-12-26 ENCOUNTER — Emergency Department (HOSPITAL_COMMUNITY): Payer: Medicare Other

## 2020-12-26 ENCOUNTER — Encounter (HOSPITAL_COMMUNITY): Payer: Self-pay

## 2020-12-26 ENCOUNTER — Inpatient Hospital Stay (HOSPITAL_COMMUNITY)
Admission: EM | Admit: 2020-12-26 | Discharge: 2021-01-02 | DRG: 177 | Disposition: A | Discharge status: Home Health | Payer: Medicare Other | Attending: Internal Medicine | Admitting: Internal Medicine

## 2020-12-26 DIAGNOSIS — N189 Chronic kidney disease, unspecified: Secondary | ICD-10-CM | POA: Diagnosis not present

## 2020-12-26 DIAGNOSIS — J9621 Acute and chronic respiratory failure with hypoxia: Secondary | ICD-10-CM | POA: Diagnosis present

## 2020-12-26 DIAGNOSIS — Z951 Presence of aortocoronary bypass graft: Secondary | ICD-10-CM | POA: Diagnosis not present

## 2020-12-26 DIAGNOSIS — N179 Acute kidney failure, unspecified: Secondary | ICD-10-CM | POA: Diagnosis present

## 2020-12-26 DIAGNOSIS — Z905 Acquired absence of kidney: Secondary | ICD-10-CM

## 2020-12-26 DIAGNOSIS — I252 Old myocardial infarction: Secondary | ICD-10-CM | POA: Diagnosis not present

## 2020-12-26 DIAGNOSIS — J80 Acute respiratory distress syndrome: Secondary | ICD-10-CM | POA: Diagnosis not present

## 2020-12-26 DIAGNOSIS — Z87891 Personal history of nicotine dependence: Secondary | ICD-10-CM | POA: Diagnosis not present

## 2020-12-26 DIAGNOSIS — Z9981 Dependence on supplemental oxygen: Secondary | ICD-10-CM | POA: Diagnosis not present

## 2020-12-26 DIAGNOSIS — I130 Hypertensive heart and chronic kidney disease with heart failure and stage 1 through stage 4 chronic kidney disease, or unspecified chronic kidney disease: Secondary | ICD-10-CM | POA: Diagnosis present

## 2020-12-26 DIAGNOSIS — R5381 Other malaise: Secondary | ICD-10-CM | POA: Diagnosis not present

## 2020-12-26 DIAGNOSIS — I480 Paroxysmal atrial fibrillation: Secondary | ICD-10-CM | POA: Diagnosis present

## 2020-12-26 DIAGNOSIS — I5033 Acute on chronic diastolic (congestive) heart failure: Secondary | ICD-10-CM | POA: Diagnosis not present

## 2020-12-26 DIAGNOSIS — I10 Essential (primary) hypertension: Secondary | ICD-10-CM | POA: Diagnosis not present

## 2020-12-26 DIAGNOSIS — U071 COVID-19: Principal | ICD-10-CM | POA: Diagnosis present

## 2020-12-26 DIAGNOSIS — E785 Hyperlipidemia, unspecified: Secondary | ICD-10-CM | POA: Diagnosis present

## 2020-12-26 DIAGNOSIS — Z7901 Long term (current) use of anticoagulants: Secondary | ICD-10-CM | POA: Diagnosis not present

## 2020-12-26 DIAGNOSIS — J1282 Pneumonia due to coronavirus disease 2019: Secondary | ICD-10-CM | POA: Diagnosis present

## 2020-12-26 DIAGNOSIS — Z20828 Contact with and (suspected) exposure to other viral communicable diseases: Secondary | ICD-10-CM | POA: Diagnosis not present

## 2020-12-26 DIAGNOSIS — I272 Pulmonary hypertension, unspecified: Secondary | ICD-10-CM | POA: Diagnosis present

## 2020-12-26 DIAGNOSIS — R188 Other ascites: Secondary | ICD-10-CM | POA: Diagnosis not present

## 2020-12-26 DIAGNOSIS — R0602 Shortness of breath: Secondary | ICD-10-CM | POA: Diagnosis not present

## 2020-12-26 DIAGNOSIS — Z794 Long term (current) use of insulin: Secondary | ICD-10-CM

## 2020-12-26 DIAGNOSIS — J9601 Acute respiratory failure with hypoxia: Secondary | ICD-10-CM | POA: Diagnosis not present

## 2020-12-26 DIAGNOSIS — Z79890 Hormone replacement therapy (postmenopausal): Secondary | ICD-10-CM | POA: Diagnosis not present

## 2020-12-26 DIAGNOSIS — E1122 Type 2 diabetes mellitus with diabetic chronic kidney disease: Secondary | ICD-10-CM | POA: Diagnosis present

## 2020-12-26 DIAGNOSIS — I2510 Atherosclerotic heart disease of native coronary artery without angina pectoris: Secondary | ICD-10-CM | POA: Diagnosis present

## 2020-12-26 DIAGNOSIS — N184 Chronic kidney disease, stage 4 (severe): Secondary | ICD-10-CM | POA: Diagnosis present

## 2020-12-26 DIAGNOSIS — R0902 Hypoxemia: Secondary | ICD-10-CM | POA: Diagnosis not present

## 2020-12-26 DIAGNOSIS — R531 Weakness: Secondary | ICD-10-CM | POA: Diagnosis not present

## 2020-12-26 DIAGNOSIS — R0689 Other abnormalities of breathing: Secondary | ICD-10-CM | POA: Diagnosis not present

## 2020-12-26 DIAGNOSIS — Z833 Family history of diabetes mellitus: Secondary | ICD-10-CM

## 2020-12-26 DIAGNOSIS — Z79899 Other long term (current) drug therapy: Secondary | ICD-10-CM

## 2020-12-26 DIAGNOSIS — R918 Other nonspecific abnormal finding of lung field: Secondary | ICD-10-CM | POA: Diagnosis not present

## 2020-12-26 DIAGNOSIS — E039 Hypothyroidism, unspecified: Secondary | ICD-10-CM | POA: Diagnosis present

## 2020-12-26 DIAGNOSIS — I517 Cardiomegaly: Secondary | ICD-10-CM | POA: Diagnosis not present

## 2020-12-26 LAB — COMPREHENSIVE METABOLIC PANEL
ALT: 17 U/L (ref 0–44)
AST: 46 U/L — ABNORMAL HIGH (ref 15–41)
Albumin: 2.6 g/dL — ABNORMAL LOW (ref 3.5–5.0)
Alkaline Phosphatase: 72 U/L (ref 38–126)
Anion gap: 15 (ref 5–15)
BUN: 50 mg/dL — ABNORMAL HIGH (ref 8–23)
CO2: 19 mmol/L — ABNORMAL LOW (ref 22–32)
Calcium: 8 mg/dL — ABNORMAL LOW (ref 8.9–10.3)
Chloride: 109 mmol/L (ref 98–111)
Creatinine, Ser: 2.74 mg/dL — ABNORMAL HIGH (ref 0.61–1.24)
GFR, Estimated: 22 mL/min — ABNORMAL LOW (ref >60)
Glucose, Bld: 151 mg/dL — ABNORMAL HIGH (ref 70–99)
Potassium: 4.9 mmol/L (ref 3.5–5.1)
Sodium: 143 mmol/L (ref 135–145)
Total Bilirubin: 1.2 mg/dL (ref 0.3–1.2)
Total Protein: 5.3 g/dL — ABNORMAL LOW (ref 6.5–8.1)

## 2020-12-26 LAB — CBC WITH DIFFERENTIAL/PLATELET
Abs Immature Granulocytes: 0.02 10*3/uL (ref 0.00–0.07)
Basophils Absolute: 0 10*3/uL (ref 0.0–0.1)
Basophils Relative: 0 %
Eosinophils Absolute: 0 10*3/uL (ref 0.0–0.5)
Eosinophils Relative: 0 %
HCT: 41.5 % (ref 39.0–52.0)
Hemoglobin: 12.5 g/dL — ABNORMAL LOW (ref 13.0–17.0)
Immature Granulocytes: 0 %
Lymphocytes Relative: 4 %
Lymphs Abs: 0.2 10*3/uL — ABNORMAL LOW (ref 0.7–4.0)
MCH: 26.4 pg (ref 26.0–34.0)
MCHC: 30.1 g/dL (ref 30.0–36.0)
MCV: 87.7 fL (ref 80.0–100.0)
Monocytes Absolute: 0.1 10*3/uL (ref 0.1–1.0)
Monocytes Relative: 3 %
Neutro Abs: 4.5 10*3/uL (ref 1.7–7.7)
Neutrophils Relative %: 93 %
Platelets: 135 10*3/uL — ABNORMAL LOW (ref 150–400)
RBC: 4.73 MIL/uL (ref 4.22–5.81)
RDW: 15.2 % (ref 11.5–15.5)
WBC: 4.9 10*3/uL (ref 4.0–10.5)
nRBC: 0 % (ref 0.0–0.2)

## 2020-12-26 LAB — FIBRINOGEN
Fibrinogen: 380 mg/dL (ref 210–475)
Fibrinogen: 383 mg/dL (ref 210–475)

## 2020-12-26 LAB — FERRITIN
Ferritin: 377 ng/mL — ABNORMAL HIGH (ref 24–336)
Ferritin: 374 ng/mL — ABNORMAL HIGH (ref 24–336)

## 2020-12-26 LAB — TROPONIN I (HIGH SENSITIVITY)
Troponin I (High Sensitivity): 319 ng/L (ref <18)
Troponin I (High Sensitivity): 499 ng/L (ref <18)

## 2020-12-26 LAB — PROCALCITONIN: Procalcitonin: 0.25 ng/mL

## 2020-12-26 LAB — D-DIMER, QUANTITATIVE
D-Dimer, Quant: 1.9 ug/mL-FEU — ABNORMAL HIGH (ref 0.00–0.50)
D-Dimer, Quant: 2.08 ug/mL-FEU — ABNORMAL HIGH (ref 0.00–0.50)

## 2020-12-26 LAB — LACTIC ACID, PLASMA
Lactic Acid, Venous: 3.9 mmol/L (ref 0.5–1.9)
Lactic Acid, Venous: 1.5 mmol/L (ref 0.5–1.9)

## 2020-12-26 LAB — SARS CORONAVIRUS 2 BY RT PCR (HOSPITAL ORDER, PERFORMED IN ~~LOC~~ HOSPITAL LAB): SARS Coronavirus 2: POSITIVE — AB

## 2020-12-26 LAB — BRAIN NATRIURETIC PEPTIDE
B Natriuretic Peptide: 719.4 pg/mL — ABNORMAL HIGH (ref 0.0–100.0)
B Natriuretic Peptide: 1081 pg/mL — ABNORMAL HIGH (ref 0.0–100.0)

## 2020-12-26 LAB — CBG MONITORING, ED: Glucose-Capillary: 130 mg/dL — ABNORMAL HIGH (ref 70–99)

## 2020-12-26 LAB — C-REACTIVE PROTEIN
CRP: 1.1 mg/dL — ABNORMAL HIGH (ref <1.0)
CRP: 1.3 mg/dL — ABNORMAL HIGH (ref <1.0)

## 2020-12-26 LAB — TRIGLYCERIDES: Triglycerides: 128 mg/dL (ref <150)

## 2020-12-26 LAB — LACTATE DEHYDROGENASE: LDH: 393 U/L — ABNORMAL HIGH (ref 98–192)

## 2020-12-26 MED ORDER — PREDNISONE 50 MG PO TABS
50.0000 mg | ORAL_TABLET | Freq: Every day | ORAL | Status: DC
  Administered 2020-12-29 – 2021-01-02 (×4): 50 mg via ORAL
  Filled 2020-12-26 (×5): qty 1

## 2020-12-26 MED ORDER — ACETAMINOPHEN 325 MG PO TABS
650.0000 mg | ORAL_TABLET | Freq: Once | ORAL | Status: AC
  Administered 2020-12-26: 650 mg via ORAL
  Filled 2020-12-26: qty 2

## 2020-12-26 MED ORDER — LINAGLIPTIN 5 MG PO TABS
5.0000 mg | ORAL_TABLET | Freq: Every day | ORAL | Status: DC
  Administered 2020-12-26 – 2021-01-02 (×7): 5 mg via ORAL
  Filled 2020-12-26 (×8): qty 1

## 2020-12-26 MED ORDER — LEVOTHYROXINE SODIUM 100 MCG PO TABS
100.0000 ug | ORAL_TABLET | Freq: Every day | ORAL | Status: DC
  Administered 2020-12-27 – 2021-01-02 (×7): 100 ug via ORAL
  Filled 2020-12-26 (×7): qty 1

## 2020-12-26 MED ORDER — SODIUM CHLORIDE 0.9 % IV SOLN
100.0000 mg | Freq: Every day | INTRAVENOUS | Status: AC
  Administered 2020-12-27 – 2020-12-30 (×4): 100 mg via INTRAVENOUS
  Filled 2020-12-26 (×4): qty 20

## 2020-12-26 MED ORDER — METHYLPREDNISOLONE SODIUM SUCC 125 MG IJ SOLR: 1.0000 mg/kg | Freq: Two times a day (BID) | INTRAMUSCULAR | Status: DC

## 2020-12-26 MED ORDER — ICOSAPENT ETHYL 1 G PO CAPS
2.0000 g | ORAL_CAPSULE | Freq: Two times a day (BID) | ORAL | Status: DC
  Administered 2020-12-26 – 2021-01-02 (×14): 2 g via ORAL
  Filled 2020-12-26 (×17): qty 2

## 2020-12-26 MED ORDER — SODIUM CHLORIDE 0.9 % IV SOLN: 100.0000 mg | Freq: Every day | INTRAVENOUS | Status: DC

## 2020-12-26 MED ORDER — CLONIDINE HCL 0.1 MG PO TABS
0.1000 mg | ORAL_TABLET | Freq: Two times a day (BID) | ORAL | Status: DC
  Administered 2020-12-26 – 2021-01-02 (×14): 0.1 mg via ORAL
  Filled 2020-12-26 (×14): qty 1

## 2020-12-26 MED ORDER — GUAIFENESIN-DM 100-10 MG/5ML PO SYRP
10.0000 mL | ORAL_SOLUTION | ORAL | Status: DC | PRN
  Administered 2021-01-02: 10 mL via ORAL
  Filled 2020-12-26: qty 10

## 2020-12-26 MED ORDER — INSULIN DETEMIR 100 UNIT/ML ~~LOC~~ SOLN
0.1500 [IU]/kg | Freq: Two times a day (BID) | SUBCUTANEOUS | Status: DC
  Administered 2020-12-27 – 2021-01-02 (×13): 13 [IU] via SUBCUTANEOUS
  Filled 2020-12-26 (×17): qty 0.13

## 2020-12-26 MED ORDER — INSULIN GLARGINE 100 UNIT/ML SOLOSTAR PEN: 18.0000 [IU] | PEN_INJECTOR | Freq: Every evening | SUBCUTANEOUS | Status: DC

## 2020-12-26 MED ORDER — AMLODIPINE BESYLATE 5 MG PO TABS
5.0000 mg | ORAL_TABLET | Freq: Every day | ORAL | Status: DC
  Administered 2020-12-27 – 2021-01-02 (×7): 5 mg via ORAL
  Filled 2020-12-26 (×7): qty 1

## 2020-12-26 MED ORDER — EZETIMIBE 10 MG PO TABS
10.0000 mg | ORAL_TABLET | Freq: Every day | ORAL | Status: DC
  Administered 2020-12-27 – 2021-01-02 (×7): 10 mg via ORAL
  Filled 2020-12-26 (×7): qty 1

## 2020-12-26 MED ORDER — INSULIN ASPART 100 UNIT/ML ~~LOC~~ SOLN
0.0000 [IU] | Freq: Three times a day (TID) | SUBCUTANEOUS | Status: DC
  Administered 2020-12-26 – 2020-12-27 (×5): 3 [IU] via SUBCUTANEOUS
  Administered 2020-12-27 – 2020-12-28 (×2): 4 [IU] via SUBCUTANEOUS
  Administered 2020-12-28 – 2021-01-01 (×6): 3 [IU] via SUBCUTANEOUS
  Administered 2021-01-01 (×2): 4 [IU] via SUBCUTANEOUS

## 2020-12-26 MED ORDER — SODIUM CHLORIDE 0.9 % IV SOLN
200.0000 mg | Freq: Once | INTRAVENOUS | Status: AC
  Administered 2020-12-26: 200 mg via INTRAVENOUS
  Filled 2020-12-26: qty 40

## 2020-12-26 MED ORDER — APIXABAN 2.5 MG PO TABS
2.5000 mg | ORAL_TABLET | Freq: Two times a day (BID) | ORAL | Status: DC
  Administered 2020-12-27 – 2021-01-02 (×14): 2.5 mg via ORAL
  Filled 2020-12-26 (×14): qty 1

## 2020-12-26 MED ORDER — HYDROCOD POLST-CPM POLST ER 10-8 MG/5ML PO SUER: 5.0000 mL | Freq: Two times a day (BID) | ORAL | Status: DC | PRN

## 2020-12-26 MED ORDER — POTASSIUM CHLORIDE CRYS ER 10 MEQ PO TBCR
10.0000 meq | EXTENDED_RELEASE_TABLET | Freq: Two times a day (BID) | ORAL | Status: DC
  Administered 2020-12-26 – 2020-12-27 (×2): 10 meq via ORAL
  Filled 2020-12-26 (×4): qty 1

## 2020-12-26 MED ORDER — METHYLPREDNISOLONE SODIUM SUCC 125 MG IJ SOLR
1.0000 mg/kg | Freq: Two times a day (BID) | INTRAMUSCULAR | Status: AC
  Administered 2020-12-26 – 2020-12-29 (×6): 86.25 mg via INTRAVENOUS
  Filled 2020-12-26 (×6): qty 2

## 2020-12-26 MED ORDER — ENOXAPARIN SODIUM 40 MG/0.4ML ~~LOC~~ SOLN: 40.0000 mg | SUBCUTANEOUS | Status: DC

## 2020-12-26 MED ORDER — POLYVINYL ALCOHOL 1.4 % OP SOLN
1.0000 [drp] | Freq: Every day | OPHTHALMIC | Status: DC | PRN
  Filled 2020-12-26: qty 15

## 2020-12-26 MED ORDER — SODIUM CHLORIDE 0.9 % IV SOLN: 200.0000 mg | Freq: Once | INTRAVENOUS | Status: DC

## 2020-12-26 MED ORDER — ROSUVASTATIN CALCIUM 20 MG PO TABS
20.0000 mg | ORAL_TABLET | Freq: Every day | ORAL | Status: DC
  Administered 2020-12-27 – 2021-01-02 (×6): 20 mg via ORAL
  Filled 2020-12-26 (×7): qty 1

## 2020-12-26 MED ORDER — FUROSEMIDE 10 MG/ML IJ SOLN
60.0000 mg | Freq: Two times a day (BID) | INTRAMUSCULAR | Status: DC
  Administered 2020-12-27 – 2020-12-31 (×10): 60 mg via INTRAVENOUS
  Filled 2020-12-26 (×10): qty 6

## 2020-12-26 NOTE — ED Provider Notes (Signed)
[facility] EMERGENCY DEPARTMENT Provider Note  CSN: [id number] Arrival date & time: 12/26/20 1510    History Chief Complaint  Patient presents with  . Shortness of Breath    HPI  [name] is a 87 y.o. male with history of CKD, CAD, DM, HTN, diastolic CHF on 2L oxygen at all times has been sick for the last few days with poor appetite, subjective fever, general malaise with SOB and dry cough starting last night. Patient was brought by EMS who reports a fever but no specific temp, given APAP prior to transport. He reports his wife has been sick, but no known Covid exposures. He has had two vaccines.    Past Medical History:  Diagnosis Date  . Chronic kidney disease    tumor right kidney  . Coronary artery disease   . Diabetes mellitus    type 2  . Hypertension   . Myocardial infarction ([place])    1997  . Right renal mass     Past Surgical History:  Procedure Laterality Date  . CARDIAC CATHETERIZATION  1996  . CARDIOVERSION N/A 08/29/2020   Procedure: CARDIOVERSION;  Surgeon: Pixie Casino, MD;  Location: [facility] ENDOSCOPY;  Service: Cardiovascular;  Laterality: N/A;  . CORONARY ARTERY BYPASS GRAFT    . DOPPLER ECHOCARDIOGRAPHY  2011  . HERNIA REPAIR    . IR GENERIC HISTORICAL  12/09/2016   IR RADIOLOGIST EVAL & MGMT 12/09/2016 [name], MD [facility] INTERV RAD  . ROBOTIC ASSITED PARTIAL NEPHRECTOMY Right 02/19/2017   Procedure: XI ROBOTIC ASSITED PARTIAL NEPHRECTOMY;  Surgeon: [name], MD;  Location: [facility] ORS;  Service: Urology;  Laterality: Right;  . TEE WITHOUT CARDIOVERSION N/A 08/29/2020   Procedure: TRANSESOPHAGEAL ECHOCARDIOGRAM (TEE);  Surgeon: Pixie Casino, MD;  Location: [facility] ENDOSCOPY;  Service: Cardiovascular;  Laterality: N/A;    Family History  Problem Relation Age of Onset  . Diabetes Mother   . Diabetes Sister     Social History   Tobacco Use  . Smoking status: Former Smoker    Packs/day: 0.30    Years: 45.00    Pack years: 13.50     Types: Cigarettes    Quit date: 02/15/1997    Years since quitting: 23.8  . Smokeless tobacco: Never Used  Vaping Use  . Vaping Use: Never used  Substance Use Topics  . Alcohol use: No  . Drug use: No     Home Medications Prior to Admission medications   Medication Sig Start Date End Date Taking? Authorizing Provider  acetaminophen (TYLENOL) 325 MG tablet Take 650 mg by mouth every 6 (six) hours as needed (for pain.).    [provider]  amLODipine (NORVASC) 5 MG tablet Take 5 mg by mouth daily. 11/10/20   [provider]  apixaban (ELIQUIS) 2.5 MG TABS tablet Take 1 tablet (2.5 mg total) by mouth 2 (two) times daily. 08/15/20   Hilty, Nadean Corwin, MD  benazepril (LOTENSIN) 40 MG tablet Take 40 mg by mouth daily. 09/29/20   [provider]  cloNIDine (CATAPRES) 0.1 MG tablet Take 0.1 mg by mouth 2 (two) times daily.    [provider]  ezetimibe (ZETIA) 10 MG tablet Take 10 mg by mouth daily.    [provider]  furosemide (LASIX) 80 MG tablet Take 1 tablet (80 mg total) by mouth 2 (two) times daily. 09/20/20   Hilty, Nadean Corwin, MD  HUMALOG KWIKPEN 100 UNIT/ML KiwkPen Inject 6-8 Units into the skin 3 (three)  times daily.  07/25/15   [provider]  icosapent Ethyl (VASCEPA) 1 g capsule TAKE 2 CAPSULES(2 GRAMS) BY MOUTH TWICE DAILY 09/26/20   Hilty, Nadean Corwin, MD  LANTUS SOLOSTAR 100 UNIT/ML Solostar Pen Inject 18 Units into the skin every evening.  02/23/19   [provider]  potassium chloride (KLOR-CON) 10 MEQ tablet Take 1 tablet (10 mEq total) by mouth 2 (two) times daily. 10/30/20   Hilty, Nadean Corwin, MD  Propylene Glycol (SYSTANE BALANCE) 0.6 % SOLN Place 1 drop into both eyes daily as needed (dry eyes).    [provider]  rosuvastatin (CRESTOR) 20 MG tablet TAKE 1 TABLET BY MOUTH EVERY DAY 11/27/20   Hilty, Nadean Corwin, MD  SYNTHROID 100 MCG tablet Take 100 mcg by mouth daily.  10/17/15   [provider]      Allergies    Patient has no known allergies.   Review of Systems   Review of Systems A comprehensive review of systems was completed and negative except as noted in HPI.    Physical Exam BP (!) 146/62 (BP Location: Left Arm)   Pulse 88   Temp 98.7 F (37.1 C) (Axillary)   Resp (!) 22   Ht 5\' 9"  (1.753 m)   Wt 86.2 kg   SpO2 92%   BMI 28.06 kg/m   Physical Exam Vitals and nursing note reviewed.  Constitutional:      Appearance: Normal appearance.  HENT:     Head: Normocephalic and atraumatic.     Nose: Nose normal.     Mouth/Throat:     Mouth: Mucous membranes are moist.  Eyes:     Extraocular Movements: Extraocular movements intact.     Conjunctiva/sclera: Conjunctivae normal.  Cardiovascular:     Rate and Rhythm: Normal rate.  Pulmonary:     Effort: Pulmonary effort is normal. Tachypnea present.     Breath sounds: Rhonchi and rales present.  Abdominal:     General: Abdomen is flat.     Palpations: Abdomen is soft.     Tenderness: There is no abdominal tenderness.  Musculoskeletal:        General: No swelling. Normal range of motion.     Cervical back: Neck supple.     Right lower leg: Edema present.     Left lower leg: Edema present.  Skin:    General: Skin is warm and dry.  Neurological:     General: No focal deficit present.     Mental Status: He is alert.  Psychiatric:        Mood and Affect: Mood normal.      ED Results / Procedures / Treatments   Labs (all labs ordered are listed, but only abnormal results are displayed) Labs Reviewed  SARS CORONAVIRUS 2 BY RT PCR ([facility])  CULTURE, BLOOD (ROUTINE X 2)  CULTURE, BLOOD (ROUTINE X 2)  LACTIC ACID, PLASMA  LACTIC ACID, PLASMA  CBC WITH DIFFERENTIAL/PLATELET  COMPREHENSIVE METABOLIC PANEL  D-DIMER, QUANTITATIVE (NOT AT [facility])  PROCALCITONIN  LACTATE DEHYDROGENASE  FERRITIN  TRIGLYCERIDES  FIBRINOGEN  C-REACTIVE PROTEIN  BRAIN  NATRIURETIC PEPTIDE  TROPONIN I (HIGH SENSITIVITY)    EKG EKG Interpretation  Date/Time:  Thursday December 26 2020 15:12:43 EST Ventricular Rate:  97 PR Interval:    QRS Duration: 93 QT Interval:  418 QTC Calculation: 531 R Axis:   8 Text Interpretation: Sinus rhythm Borderline T abnormalities, inferior leads Prolonged QT interval Since last  tracing Right bundle branch block and repolarization abnormality have resolved in precordial leads Confirmed by Calvert Cantor [phone]) on 12/26/2020 3:16:20 PM   Radiology No results found.  Procedures .Critical Care Performed by: Truddie Hidden, MD Authorized by: Truddie Hidden, MD   Critical care provider statement:    Critical care time (minutes):  45   Critical care time was exclusive of:  Separately billable procedures and treating other patients   Critical care was necessary to treat or prevent imminent or life-threatening deterioration of the following conditions:  Respiratory failure   Critical care was time spent personally by me on the following activities:  Discussions with consultants, evaluation of patient's response to treatment, examination of patient, ordering and performing treatments and interventions, ordering and review of laboratory studies, ordering and review of radiographic studies, pulse oximetry, re-evaluation of patient's condition, obtaining history from patient or surrogate and review of old charts   Care discussed with: admitting provider      Medications Ordered in the ED Medications - No data to display   MDM Rules/Calculators/A&P MDM SpO2 92-95% on NRB. Symptoms concerning for Covid, but also has CHF with leg swelling which is states is chronic for him. Will check Covid lab panel, add Trop and BNP. Anticipate admission.  ED Course  I have reviewed the triage vital signs and the nursing notes.  Pertinent labs & imaging results that were available during my care of the patient were reviewed by me  and considered in my medical decision making (see chart for details).  Clinical Course as of 12/26/20 2131  Thu Dec 26, 2020  1637 CBC is normal. CXR consistent with suspected Covid vs CAP. Dimer is mild elevated as well.  [CS]  5631 SHFWYOV ZCHY is elevated. Will add APAP.  [CS]  32 CMP with mild worsening of CKD. Trop is elevated of unclear significance, he denies CP.  [CS]  1834 Covid is confirmed positive. SpO2 remains 92% on NRB, will try HFNC. Plan admission.  [CS]  Kiel paged.  [CS]  1909 Spoke with Dr. Sidney Ace, Hospitalist, who will evaluate for admission. Solumedrol and Remdesivir ordered.  [CS]    Clinical Course User Index [CS] Truddie Hidden, MD    Final Clinical Impression(s) / ED Diagnoses Final diagnoses:  COVID-19  Acute respiratory failure with hypoxia [facility])    Rx / DC Orders ED Discharge Orders    None       Truddie Hidden, MD 12/26/20 2132

## 2020-12-26 NOTE — ED Notes (Signed)
Attempted to call reports x 2

## 2020-12-26 NOTE — H&P (Addendum)
[place]   PATIENT NAME: [name]    MR#:  [id number]  DATE OF BIRTH:  Nov 04, 1932  DATE OF ADMISSION:  12/26/2020  PRIMARY CARE PHYSICIAN: [name], MD   REQUESTING/REFERRING PHYSICIAN: Truddie Hidden, MD  CHIEF COMPLAINT:   Chief Complaint  Patient presents with  . Shortness of Breath    HISTORY OF PRESENT ILLNESS:  [name]  is a 87 y.o. African-American male with a known history of type 2 diabetes mellitus, hypertension, coronary artery. disease diastolic CHF with chronic respiratory failure on home O2 at 2 L/min and chronic kidney disease, who presented to the emergency room with acute onset of worsening dyspnea and feeling sick over the last few days with poor appetite, subjective fever with fever noted by EMS and low pulse oximetry reported by EMS that was down to 91% on room air.  The patient has received 2 COVID-19 vaccines.  He denies any nausea or vomiting or diarrhea.  He denied any loss of taste or smell.  No significant cough or wheezing.  No chest pain or palpitations.  Upon presentation to the ER, temperature was 98.7 later 99.3 blood pressure was 146/62 with respiratory to 22 and pulse currently was 94% on 100% nonrebreather.  Labs revealed a BUN of 50 and creatinine 2.74 up from 2.33 on 9/24 and BNP of 719.4 with LDH 393 high sensitive troponin I of 319 ferritin of 377 CRP of 1.1 and lactic acid 3.9 and later 1.5 with procalcitonin of 0.25.  CBC showed thrombocytopenia.  COVID-19 PCR came back positive.  Blood cultures were drawn.  Chest x-ray showed cardiomegaly with moderate to marked severe bilateral infiltrates.EKG showed sinus rhythm with rate of 97 with prolonged QT interval with QTC of 531 MS and Q waves in lead III with possible right bundle branch block  The patient was given 650 mg p.o. Tylenol as well as IV Solu-Medrol and remdesivir.  He will be admitted to a medical monitored telemetry isolation bed for further evaluation and  management. PAST MEDICAL HISTORY:   Past Medical History:  Diagnosis Date  . Chronic kidney disease    tumor right kidney  . Coronary artery disease   . Diabetes mellitus    type 2  . Hypertension   . Myocardial infarction ([place])    1997  . Right renal mass   -Chronic diastolic CHF. - Pulmonary hypertension - Atrial fibrillation on Eliquis.  PAST SURGICAL HISTORY:   Past Surgical History:  Procedure Laterality Date  . CARDIAC CATHETERIZATION  1996  . CARDIOVERSION N/A 08/29/2020   Procedure: CARDIOVERSION;  Surgeon: Pixie Casino, MD;  Location: [facility] ENDOSCOPY;  Service: Cardiovascular;  Laterality: N/A;  . CORONARY ARTERY BYPASS GRAFT    . DOPPLER ECHOCARDIOGRAPHY  2011  . HERNIA REPAIR    . IR GENERIC HISTORICAL  12/09/2016   IR RADIOLOGIST EVAL & MGMT 12/09/2016 [name], MD [facility] INTERV RAD  . ROBOTIC ASSITED PARTIAL NEPHRECTOMY Right 02/19/2017   Procedure: XI ROBOTIC ASSITED PARTIAL NEPHRECTOMY;  Surgeon: [name], MD;  Location: [facility] ORS;  Service: Urology;  Laterality: Right;  . TEE WITHOUT CARDIOVERSION N/A 08/29/2020   Procedure: TRANSESOPHAGEAL ECHOCARDIOGRAM (TEE);  Surgeon: Pixie Casino, MD;  Location: [facility] ENDOSCOPY;  Service: Cardiovascular;  Laterality: N/A;    SOCIAL HISTORY:   Social History   Tobacco Use  . Smoking status: Former Smoker    Packs/day: 0.30    Years: 45.00    Pack years: 13.50  Types: Cigarettes    Quit date: 02/15/1997    Years since quitting: 23.8  . Smokeless tobacco: Never Used  Substance Use Topics  . Alcohol use: No    FAMILY HISTORY:   Family History  Problem Relation Age of Onset  . Diabetes Mother   . Diabetes Sister     DRUG ALLERGIES:  No Known Allergies  REVIEW OF SYSTEMS:   ROS As per history of present illness. All pertinent systems were reviewed above. Constitutional, HEENT, cardiovascular, respiratory, GI, GU, musculoskeletal, neuro, psychiatric, endocrine, integumentary and hematologic systems  were reviewed and are otherwise negative/unremarkable except for positive findings mentioned above in the HPI.   MEDICATIONS AT HOME:   Prior to Admission medications   Medication Sig Start Date End Date Taking? Authorizing Provider  acetaminophen (TYLENOL) 325 MG tablet Take 650 mg by mouth every 6 (six) hours as needed (for pain.).    [provider]  amLODipine (NORVASC) 5 MG tablet Take 5 mg by mouth daily. 11/10/20   [provider]  apixaban (ELIQUIS) 2.5 MG TABS tablet Take 1 tablet (2.5 mg total) by mouth 2 (two) times daily. 08/15/20   Hilty, Nadean Corwin, MD  benazepril (LOTENSIN) 40 MG tablet Take 40 mg by mouth daily. 09/29/20   [provider]  cloNIDine (CATAPRES) 0.1 MG tablet Take 0.1 mg by mouth 2 (two) times daily.    [provider]  ezetimibe (ZETIA) 10 MG tablet Take 10 mg by mouth daily.    [provider]  furosemide (LASIX) 80 MG tablet Take 1 tablet (80 mg total) by mouth 2 (two) times daily. 09/20/20   Hilty, Nadean Corwin, MD  HUMALOG KWIKPEN 100 UNIT/ML KiwkPen Inject 6-8 Units into the skin 3 (three) times daily.  07/25/15   [provider]  icosapent Ethyl (VASCEPA) 1 g capsule TAKE 2 CAPSULES(2 GRAMS) BY MOUTH TWICE DAILY 09/26/20   Hilty, Nadean Corwin, MD  LANTUS SOLOSTAR 100 UNIT/ML Solostar Pen Inject 18 Units into the skin every evening.  02/23/19   [provider]  potassium chloride (KLOR-CON) 10 MEQ tablet Take 1 tablet (10 mEq total) by mouth 2 (two) times daily. 10/30/20   Hilty, Nadean Corwin, MD  Propylene Glycol (SYSTANE BALANCE) 0.6 % SOLN Place 1 drop into both eyes daily as needed (dry eyes).    [provider]  rosuvastatin (CRESTOR) 20 MG tablet TAKE 1 TABLET BY MOUTH EVERY DAY 11/27/20   Hilty, Nadean Corwin, MD  SYNTHROID 100 MCG tablet Take 100 mcg by mouth daily.  10/17/15   [provider]      VITAL SIGNS:  Blood pressure 120/62, pulse 72, temperature (!) 101.3 F (38.5 C),  temperature source Rectal, resp. rate (!) 27, height 5\' 9"  (1.753 m), weight 86.2 kg, SpO2 90 %.  PHYSICAL EXAMINATION:  Physical Exam  GENERAL:  87 y.o.-year-old African-American male patient lying in the bed with mild respiratory distress with conversational dyspnea.   EYES: Pupils equal, round, reactive to light and accommodation. No scleral icterus. Extraocular muscles intact.  HEENT: Head atraumatic, normocephalic. Oropharynx and nasopharynx clear.  NECK:  Supple, no jugular venous distention. No thyroid enlargement, no tenderness.  LUNGS: Diminished bibasal mid lung zones with bibasal crackles. CARDIOVASCULAR: Regular rate and rhythm, S1, S2 normal. No murmurs, rubs, or gallops.  ABDOMEN: Soft, nondistended, nontender. Bowel sounds present. No organomegaly or mass.  EXTREMITIES: 1+ bilateral lower extremity pitting edema with cyanosis, or clubbing.  NEUROLOGIC: Cranial nerves II through XII are intact.  Muscle strength 5/5 in all extremities. Sensation intact. Gait not checked.  PSYCHIATRIC: The patient is alert and oriented x 3.  Normal affect and good eye contact. SKIN: No obvious rash, lesion, or ulcer.   LABORATORY PANEL:   CBC Recent Labs  Lab 12/26/20 1532  WBC 4.9  HGB 12.5*  HCT 41.5  PLT 135*   ------------------------------------------------------------------------------------------------------------------  Chemistries  Recent Labs  Lab 12/26/20 1532  NA 143  K 4.9  CL 109  CO2 19*  GLUCOSE 151*  BUN 50*  CREATININE 2.74*  CALCIUM 8.0*  AST 46*  ALT 17  ALKPHOS 72  BILITOT 1.2   ------------------------------------------------------------------------------------------------------------------  Cardiac Enzymes No results for input(s): TROPONINI in the last 168 hours. ------------------------------------------------------------------------------------------------------------------  RADIOLOGY:  DG Chest Port 1 View  Result Date: 12/26/2020 CLINICAL  DATA:  Chills and generalized weakness x2 days. EXAM: PORTABLE CHEST 1 VIEW COMPARISON:  August 23, 2020 FINDINGS: Multiple sternal wires and vascular clips are seen. Moderate to marked severity infiltrates are seen within the mid and lower lung fields, bilaterally. Mild, chronic appearing increased lung markings are also seen. There is no evidence of a pleural effusion or pneumothorax. Moderate to marked severity enlargement of the cardiac silhouette is noted. Degenerative changes seen throughout the thoracic spine. IMPRESSION: Cardiomegaly with moderate to marked severity bilateral infiltrates. Electronically Signed   By: Virgina Norfolk M.D.   On: 12/26/2020 16:22      IMPRESSION AND PLAN:   1.  Acute hypoxemic respiratory failure secondary to COVID-19. -The patient will be admitted to a medically monitored isolation bed. -O2 protocol will be followed to keep O2 saturation above 93.   2.  Multifocal pneumonia secondary to COVID-19. -The patient will be admitted to an isolation monitored bed with droplet and contact precautions. -Given multifocal pneumonia we will empirically place the patient on IV Rocephin and Zithromax for possible bacterial superinfection only with elevated Procalcitonin. -The patient will be placed on scheduled Mucinex and as needed Tussionex. -We will avoid nebulization as much as we can, give bronchodilator MDI if needed, and with deterioration of oxygenation try to avoid BiPAP/CPAP if possible.    -Will obtain sputum Gram stain culture and sensitivity and follow blood cultures. -O2 protocol will be followed. -We will follow CRP, ferritin, LDH and D-dimer. -Will follow manual differential for ANC/ALC ratio as well as follow troponin I and daily CBC with manual differential and CMP. - Will place the patient on IV Remdesivir and IV steroid therapy with IV Solu-Medrol with elevated inflammatory markers. -The patient will be placed on vitamin D3, vitamin C, zinc  sulfate, p.o. Pepcid and aspirin. -I discussed Baricitinib and the patient agreed to proceed with it.  3.  Elevated BNP could be related to acute on chronic diastolic CHF.  The patient had a TEE that showed EF of 60 to 65% in September of last year with mild to moderate right atrial dilatation. - The patient will be continued on diuresis but with IV Lasix given initial borderline blood pressure.  4.  Acute kidney injury superimposed on stage IV chronic kidney disease.  This is likely prerenal due to acute CHF. - The patient will be diuresed with IV Lasix and will follow BMP. - We will hold off nephrotoxins.  5.  Type 2 diabetes mellitus. - The patient will be placed on supplemental coverage with NovoLog. - We will continue basal coverage.  6.  Dyslipidemia. - We will continue statin therapy and Zetia.  7.  Essential hypertension. -  We will continue antihypertensives.  8.  Hypothyroidism. - We will continue his Synthroid.  9. Paroxysmal atrial fibrillation currently in normal sinus rhythm and DVT prophylaxis. - We will continue Eliquis   All the records are reviewed and case discussed with ED provider. The plan of care was discussed in details with the patient (and family). I answered all questions. The patient agreed to proceed with the above mentioned plan. Further management will depend upon hospital course.   CODE STATUS: Full code  Status is: Inpatient  Remains inpatient appropriate because:Ongoing diagnostic testing needed not appropriate for outpatient work up, Unsafe d/c plan, IV treatments appropriate due to intensity of illness or inability to take PO and Inpatient level of care appropriate due to severity of illness   Dispo: The patient is from: Home              Anticipated d/c is to: Home              Anticipated d/c date is: > 3 days              Patient currently is not medically stable to d/c.    TOTAL TIME TAKING CARE OF THIS PATIENT: 55 minutes.     Christel Mormon M.D on 12/26/2020 at 8:12 PM  [facility]   From 7 PM-7 AM, contact night-coverage [email or web address]  CC: Primary care physician; [name], MD

## 2020-12-26 NOTE — ED Notes (Signed)
This [facility] for verification of due medications

## 2020-12-26 NOTE — ED Triage Notes (Signed)
Pt arrived via [facility] from home for c/o lack of appetite, chills, generalized weaknessx2 days. Pt started having SOB last night. Family members at home are sick per EMS. Pt wears 3L 02 per [place] at baseline. EMS gave tylenol 1 gram at 1445 today. Pt A&Ox4. Pt Is diaphretic.

## 2020-12-27 ENCOUNTER — Encounter (HOSPITAL_BASED_OUTPATIENT_CLINIC_OR_DEPARTMENT_OTHER): Payer: Medicare Other | Admitting: Internal Medicine

## 2020-12-27 LAB — CBC WITH DIFFERENTIAL/PLATELET
Abs Immature Granulocytes: 0.01 10*3/uL (ref 0.00–0.07)
Basophils Absolute: 0 10*3/uL (ref 0.0–0.1)
Basophils Relative: 0 %
Eosinophils Absolute: 0 10*3/uL (ref 0.0–0.5)
Eosinophils Relative: 0 %
HCT: 42.1 % (ref 39.0–52.0)
Hemoglobin: 12.9 g/dL — ABNORMAL LOW (ref 13.0–17.0)
Immature Granulocytes: 1 %
Lymphocytes Relative: 9 %
Lymphs Abs: 0.2 10*3/uL — ABNORMAL LOW (ref 0.7–4.0)
MCH: 26.1 pg (ref 26.0–34.0)
MCHC: 30.6 g/dL (ref 30.0–36.0)
MCV: 85.2 fL (ref 80.0–100.0)
Monocytes Absolute: 0.1 10*3/uL (ref 0.1–1.0)
Monocytes Relative: 3 %
Neutro Abs: 1.9 10*3/uL (ref 1.7–7.7)
Neutrophils Relative %: 87 %
Platelets: 107 10*3/uL — ABNORMAL LOW (ref 150–400)
RBC: 4.94 MIL/uL (ref 4.22–5.81)
RDW: 15.2 % (ref 11.5–15.5)
WBC: 2.2 10*3/uL — ABNORMAL LOW (ref 4.0–10.5)
nRBC: 0 % (ref 0.0–0.2)

## 2020-12-27 LAB — COMPREHENSIVE METABOLIC PANEL
ALT: 16 U/L (ref 0–44)
AST: 43 U/L — ABNORMAL HIGH (ref 15–41)
Albumin: 2.4 g/dL — ABNORMAL LOW (ref 3.5–5.0)
Alkaline Phosphatase: 68 U/L (ref 38–126)
Anion gap: 13 (ref 5–15)
BUN: 54 mg/dL — ABNORMAL HIGH (ref 8–23)
CO2: 19 mmol/L — ABNORMAL LOW (ref 22–32)
Calcium: 7.9 mg/dL — ABNORMAL LOW (ref 8.9–10.3)
Chloride: 111 mmol/L (ref 98–111)
Creatinine, Ser: 2.6 mg/dL — ABNORMAL HIGH (ref 0.61–1.24)
GFR, Estimated: 23 mL/min — ABNORMAL LOW (ref >60)
Glucose, Bld: 114 mg/dL — ABNORMAL HIGH (ref 70–99)
Potassium: 5.3 mmol/L — ABNORMAL HIGH (ref 3.5–5.1)
Sodium: 143 mmol/L (ref 135–145)
Total Bilirubin: 0.8 mg/dL (ref 0.3–1.2)
Total Protein: 5.5 g/dL — ABNORMAL LOW (ref 6.5–8.1)

## 2020-12-27 LAB — GLUCOSE, CAPILLARY
Glucose-Capillary: 127 mg/dL — ABNORMAL HIGH (ref 70–99)
Glucose-Capillary: 124 mg/dL — ABNORMAL HIGH (ref 70–99)
Glucose-Capillary: 133 mg/dL — ABNORMAL HIGH (ref 70–99)
Glucose-Capillary: 148 mg/dL — ABNORMAL HIGH (ref 70–99)

## 2020-12-27 LAB — C-REACTIVE PROTEIN: CRP: 2 mg/dL — ABNORMAL HIGH (ref <1.0)

## 2020-12-27 LAB — FERRITIN: Ferritin: 378 ng/mL — ABNORMAL HIGH (ref 24–336)

## 2020-12-27 LAB — D-DIMER, QUANTITATIVE: D-Dimer, Quant: 3.12 ug/mL-FEU — ABNORMAL HIGH (ref 0.00–0.50)

## 2020-12-27 MED ORDER — POTASSIUM CHLORIDE CRYS ER 10 MEQ PO TBCR
10.0000 meq | EXTENDED_RELEASE_TABLET | Freq: Two times a day (BID) | ORAL | Status: DC
  Administered 2020-12-28 – 2020-12-31 (×7): 10 meq via ORAL
  Filled 2020-12-27 (×9): qty 1

## 2020-12-27 MED ORDER — BARICITINIB 1 MG PO TABS
1.0000 mg | ORAL_TABLET | Freq: Every day | ORAL | Status: DC
  Administered 2020-12-27 – 2021-01-02 (×7): 1 mg via ORAL
  Filled 2020-12-27 (×7): qty 1

## 2020-12-27 NOTE — Progress Notes (Signed)
Patient 02 tubing disconnected from flow meter causing 02 sats to drop in the 60s. Also the nose bridge broke and it was completely replaced

## 2020-12-27 NOTE — Progress Notes (Signed)
Patient SOB desats with non rebreather when lying flat. Gave steroid IV and increased head position. Will collaborate with R/T to wean 02.

## 2020-12-27 NOTE — Progress Notes (Signed)
PROGRESS NOTE    [name]  BMW:[id number] DOB: 1932-01-20 DOA: 12/26/2020 PCP: [name], MD   Brief Narrative:  [name]  is a 87 y.o. African-American male with a known history of type 2 diabetes mellitus, hypertension, coronary artery. disease diastolic CHF with chronic respiratory failure on home O2 at 2 L/min and chronic kidney disease, who presented to the emergency room with acute onset of worsening dyspnea and feeling sick over the last few days with poor appetite, subjective fever with fever noted by EMS and low pulse oximetry reported by EMS that was down to 91% on room air.  The patient has received 2 COVID-19 vaccines.  He denies any nausea or vomiting or diarrhea.  He denied any loss of taste or smell.  No significant cough or wheezing.  No chest pain or palpitations. In the ED: Afebrile and tachypneic with positive COVID-19 PCR. Chest x-ray showed cardiomegaly with moderate to marked severe bilateral infiltrates. Admitted due to hypoxia and covid pneumonia.   Assessment & Plan:   Active Problems:   Acute hypoxemic respiratory failure (HCC)   Acute hypoxemic respiratory failure secondary to COVID-19 - Discontinue antibiotics given minimally elevated procalcitonin, follow cultures - Leukopenia likely secondary to covid - CXR diffuse opacity consistent with covid pna - Continue Remdesivir, Solu-Medrol, Baricitinib per COVID guidelines Recent Labs    12/26/20 1532 12/26/20 2142 12/27/20 0359  DDIMER 1.90* 2.08* 3.12*  FERRITIN 377* 374* 378*  LDH 393*  --   --   CRP 1.1* 1.3* 2.0*    Elevated BNP could be related to acute on chronic diastolic CHF.   TEE 09/21 that showed EF of 60 to 65% with mild to moderate right atrial dilatation. - Continue IV lasix  Stage IV chronic kidney disease. - Continue diuresis as above - Follow creatinine/volume status  Type 2 diabetes mellitus. - The patient will be placed on supplemental coverage with NovoLog. -  Continue  sliding scale, hypoglycemic protocol  Dyslipidemia. - Continue statin therapy and Zetia.  Essential hypertension. - Continue antihypertensives.  Hypothyroidism. - Continue his Synthroid.  Paroxysmal atrial fibrillation currently in normal sinus rhythm and DVT prophylaxis. - Continue Eliquis  DVT prophylaxis: Eliquis Code Status: Full Family Communication: None available  Status is: Inpatient  Dispo: The patient is from: Home              Anticipated d/c is to: Home              Anticipated d/c date is: 48-72h              Patient currently NOT medically stable for discharge  Consultants:   None  Procedures:   None  Antimicrobials:  Remdesivir   Subjective: No acute issues/events overnight - respiratory status improving daily. Denies chest pain, diaphoresis, nausea, vomiting.  Objective: Vitals:   12/26/20 2319 12/26/20 2320 12/27/20 0430 12/27/20 0637  BP: (!) 142/73  114/66   Pulse: 71  60   Resp: 20  18   Temp: 98.1 F (36.7 C)  97.9 F (36.6 C)   TempSrc: Oral  Axillary   SpO2: 94%  90%   Weight:  81.7 kg  81.5 kg  Height:  5\' 9"  (1.753 m)      Intake/Output Summary (Last 24 hours) at 12/27/2020 0738 Last data filed at 12/27/2020 0100 Gross per 24 hour  Intake 310 ml  Output 260 ml  Net 50 ml   Filed Weights   12/26/20 1527 12/26/20 2320 12/27/20  6659  Weight: 86.2 kg 81.7 kg 81.5 kg    Examination:  General:  Pleasantly resting in bed, No acute distress. HEENT:  Normocephalic atraumatic.  Sclerae nonicteric, noninjected.  Extraocular movements intact bilaterally. Neck:  Without mass or deformity.  Trachea is midline. Lungs:  Clear to auscultate bilaterally without rhonchi, wheeze, or rales. Heart:  Regular rate and rhythm.  Without murmurs, rubs, or gallops. Abdomen:  Soft, nontender, nondistended.  Without guarding or rebound. Extremities: Bilateral lower extremity wrapped without overt pitting edema. Vascular:  Dorsalis pedis and  posterior tibial pulses palpable bilaterally.  Data Reviewed: I have personally reviewed following labs and imaging studies  CBC: Recent Labs  Lab 12/26/20 1532  WBC 4.9  NEUTROABS 4.5  HGB 12.5*  HCT 41.5  MCV 87.7  PLT 935*   Basic Metabolic Panel: Recent Labs  Lab 12/26/20 1532 12/27/20 0359  NA 143 143  K 4.9 5.3*  CL 109 111  CO2 19* 19*  GLUCOSE 151* 114*  BUN 50* 54*  CREATININE 2.74* 2.60*  CALCIUM 8.0* 7.9*   GFR: Estimated Creatinine Clearance: 20.4 mL/min (A) (by C-G formula based on SCr of 2.6 mg/dL (H)). Liver Function Tests: Recent Labs  Lab 12/26/20 1532 12/27/20 0359  AST 46* 43*  ALT 17 16  ALKPHOS 72 68  BILITOT 1.2 0.8  PROT 5.3* 5.5*  ALBUMIN 2.6* 2.4*   No results for input(s): LIPASE, AMYLASE in the last 168 hours. No results for input(s): AMMONIA in the last 168 hours. Coagulation Profile: No results for input(s): INR, PROTIME in the last 168 hours. Cardiac Enzymes: No results for input(s): CKTOTAL, CKMB, CKMBINDEX, TROPONINI in the last 168 hours. BNP (last 3 results) No results for input(s): PROBNP in the last 8760 hours. HbA1C: No results for input(s): HGBA1C in the last 72 hours. CBG: Recent Labs  Lab 12/26/20 2119 12/27/20 0622  GLUCAP 130* 127*   Lipid Profile: Recent Labs    12/26/20 1532  TRIG 128   Thyroid Function Tests: No results for input(s): TSH, T4TOTAL, FREET4, T3FREE, THYROIDAB in the last 72 hours. Anemia Panel: Recent Labs    12/26/20 2142 12/27/20 0359  FERRITIN 374* 378*   Sepsis Labs: Recent Labs  Lab 12/26/20 1532 12/26/20 1732  PROCALCITON 0.25  --   LATICACIDVEN 3.9* 1.5    Recent Results (from the past 240 hour(s))  SARS Coronavirus 2 by RT PCR (hospital order, performed in [facility] hospital lab) Nasopharyngeal Nasopharyngeal Swab     Status: Abnormal   Collection Time: 12/26/20  3:45 PM   Specimen: Nasopharyngeal Swab  Result Value Ref Range Status   SARS Coronavirus 2  POSITIVE (A) NEGATIVE Final    Comment: RESULT CALLED TO, READ BACK BY AND VERIFIED WITH: A BANKS RN 1827 12/26/20 A BROWNING (NOTE) SARS-CoV-2 target nucleic acids are DETECTED  SARS-CoV-2 RNA is generally detectable in upper respiratory specimens  during the acute phase of infection.  Positive results are indicative  of the presence of the identified virus, but do not rule out bacterial infection or co-infection with other pathogens not detected by the test.  Clinical correlation with patient history and  other diagnostic information is necessary to determine patient infection status.  The expected result is negative.  Fact Sheet for Patients:   StrictlyIdeas.no   Fact Sheet for Healthcare Providers:   BankingDealers.co.za    This test is not yet approved or cleared by the [place] FDA and  has been authorized for detection and/or diagnosis of  SARS-CoV-2 by FDA under an Emergency Use Authorization (EUA).  This EUA will remain in effect (meaning this te st can be used) for the duration of  the COVID-19 declaration under Section 564(b)(1) of the Act, 21 U.S.C. section 360-bbb-3(b)(1), unless the authorization is terminated or revoked sooner.  Performed at [facility], [place] [address], [place], [place] [address]          Radiology Studies: DG Chest Port 1 View  Result Date: 12/26/2020 CLINICAL DATA:  Chills and generalized weakness x2 days. EXAM: PORTABLE CHEST 1 VIEW COMPARISON:  August 23, 2020 FINDINGS: Multiple sternal wires and vascular clips are seen. Moderate to marked severity infiltrates are seen within the mid and lower lung fields, bilaterally. Mild, chronic appearing increased lung markings are also seen. There is no evidence of a pleural effusion or pneumothorax. Moderate to marked severity enlargement of the cardiac silhouette is noted. Degenerative changes seen throughout the thoracic spine. IMPRESSION:  Cardiomegaly with moderate to marked severity bilateral infiltrates. Electronically Signed   By: Virgina Norfolk M.D.   On: 12/26/2020 16:22        Scheduled Meds: . amLODipine  5 mg Oral Daily  . apixaban  2.5 mg Oral BID  . baricitinib  1 mg Oral Daily  . cloNIDine  0.1 mg Oral BID  . ezetimibe  10 mg Oral Daily  . furosemide  60 mg Intravenous Q12H  . icosapent Ethyl  2 g Oral BID  . insulin aspart  0-20 Units Subcutaneous TID PC & HS  . insulin detemir  0.15 Units/kg Subcutaneous BID  . levothyroxine  100 mcg Oral Daily  . linagliptin  5 mg Oral Daily  . methylPREDNISolone (SOLU-MEDROL) injection  1 mg/kg Intravenous Q12H   Followed by  . [START ON 12/29/2020] predniSONE  50 mg Oral Daily  . potassium chloride  10 mEq Oral BID  . rosuvastatin  20 mg Oral Daily   Continuous Infusions: . remdesivir 100 mg in NS 100 mL       LOS: 1 day   Time spent: 53min  Paton Crum C Katrena Stehlin, DO [facility]  If 7PM-7AM, please contact night-coverage [email or web address]  12/27/2020, 7:38 AM

## 2020-12-27 NOTE — Progress Notes (Signed)
Spoke with daughter [name] today and gave a update with patients permission. Education was given about precautions d/t COVID positive Exposure.

## 2020-12-28 LAB — GLUCOSE, CAPILLARY
Glucose-Capillary: 119 mg/dL — ABNORMAL HIGH (ref 70–99)
Glucose-Capillary: 167 mg/dL — ABNORMAL HIGH (ref 70–99)
Glucose-Capillary: 146 mg/dL — ABNORMAL HIGH (ref 70–99)
Glucose-Capillary: 110 mg/dL — ABNORMAL HIGH (ref 70–99)

## 2020-12-28 LAB — CBC WITH DIFFERENTIAL/PLATELET
Abs Immature Granulocytes: 0.01 10*3/uL (ref 0.00–0.07)
Basophils Absolute: 0 10*3/uL (ref 0.0–0.1)
Basophils Relative: 0 %
Eosinophils Absolute: 0 10*3/uL (ref 0.0–0.5)
Eosinophils Relative: 0 %
HCT: 42.1 % (ref 39.0–52.0)
Hemoglobin: 13.2 g/dL (ref 13.0–17.0)
Immature Granulocytes: 0 %
Lymphocytes Relative: 8 %
Lymphs Abs: 0.2 10*3/uL — ABNORMAL LOW (ref 0.7–4.0)
MCH: 26.3 pg (ref 26.0–34.0)
MCHC: 31.4 g/dL (ref 30.0–36.0)
MCV: 83.9 fL (ref 80.0–100.0)
Monocytes Absolute: 0.2 10*3/uL (ref 0.1–1.0)
Monocytes Relative: 5 %
Neutro Abs: 2.6 10*3/uL (ref 1.7–7.7)
Neutrophils Relative %: 87 %
Platelets: 185 10*3/uL (ref 150–400)
RBC: 5.02 MIL/uL (ref 4.22–5.81)
RDW: 14.7 % (ref 11.5–15.5)
WBC: 3 10*3/uL — ABNORMAL LOW (ref 4.0–10.5)
nRBC: 0 % (ref 0.0–0.2)

## 2020-12-28 LAB — COMPREHENSIVE METABOLIC PANEL
ALT: 14 U/L (ref 0–44)
AST: 35 U/L (ref 15–41)
Albumin: 2.2 g/dL — ABNORMAL LOW (ref 3.5–5.0)
Alkaline Phosphatase: 68 U/L (ref 38–126)
Anion gap: 11 (ref 5–15)
BUN: 67 mg/dL — ABNORMAL HIGH (ref 8–23)
CO2: 22 mmol/L (ref 22–32)
Calcium: 7.7 mg/dL — ABNORMAL LOW (ref 8.9–10.3)
Chloride: 110 mmol/L (ref 98–111)
Creatinine, Ser: 2.67 mg/dL — ABNORMAL HIGH (ref 0.61–1.24)
GFR, Estimated: 23 mL/min — ABNORMAL LOW (ref >60)
Glucose, Bld: 124 mg/dL — ABNORMAL HIGH (ref 70–99)
Potassium: 5.1 mmol/L (ref 3.5–5.1)
Sodium: 143 mmol/L (ref 135–145)
Total Bilirubin: 0.8 mg/dL (ref 0.3–1.2)
Total Protein: 5 g/dL — ABNORMAL LOW (ref 6.5–8.1)

## 2020-12-28 LAB — C-REACTIVE PROTEIN: CRP: 1.7 mg/dL — ABNORMAL HIGH (ref <1.0)

## 2020-12-28 LAB — D-DIMER, QUANTITATIVE: D-Dimer, Quant: 1.92 ug/mL-FEU — ABNORMAL HIGH (ref 0.00–0.50)

## 2020-12-28 LAB — FERRITIN: Ferritin: 427 ng/mL — ABNORMAL HIGH (ref 24–336)

## 2020-12-28 NOTE — Progress Notes (Signed)
Patient desating low 70s-low 80s with labored breathing 02 was shut off patient is chronically on 02 at home at 3L has required high flow maintain sats currently on 3L with 02 sats93-96 with sititng

## 2020-12-28 NOTE — Significant Event (Signed)
Desats to mid 70s on 3L High flow walking from chair to bed. Requiring 10L to recover sats 96% in bed on 6L High flow n/c in bed

## 2020-12-28 NOTE — Progress Notes (Signed)
PROGRESS NOTE    [name]  QBH:[id number] DOB: 1932/04/23 DOA: 12/26/2020 PCP: [name], MD   Brief Narrative:  [name]  is a 87 y.o. African-American male with a known history of type 2 diabetes mellitus, hypertension, coronary artery. disease diastolic CHF with chronic respiratory failure on home O2 at 2 L/min and chronic kidney disease, who presented to the emergency room with acute onset of worsening dyspnea and feeling sick over the last few days with poor appetite, subjective fever with fever noted by EMS and low pulse oximetry reported by EMS that was down to 91% on room air.  The patient has received 2 COVID-19 vaccines.  He denies any nausea or vomiting or diarrhea.  He denied any loss of taste or smell.  No significant cough or wheezing.  No chest pain or palpitations. In the ED: Afebrile and tachypneic with positive COVID-19 PCR. Chest x-ray showed cardiomegaly with moderate to marked severe bilateral infiltrates. Admitted due to hypoxia and covid pneumonia.   Assessment & Plan:   Active Problems:   Acute hypoxemic respiratory failure (HCC)  Acute hypoxemic respiratory failure secondary to COVID-19 - Discontinue antibiotics given low procalcitonin, follow cultures (negative so far) - Leukopenia likely secondary to covid - CXR diffuse opacity consistent with covid pna - Continue Remdesivir, Solu-Medrol, Baricitinib Recent Labs    12/26/20 1532 12/26/20 2142 12/27/20 0359 12/28/20 0444  DDIMER 1.90* 2.08* 3.12* 1.92*  FERRITIN 377* 374* 378* 427*  LDH 393*  --   --   --   CRP 1.1* 1.3* 2.0* 1.7*   Elevated BNP secondary to acute on chronic diastolic CHF.   TEE 09/21 that showed EF of 60 to 65% with mild to moderate right atrial dilatation. - Continue IV lasix - UOP unimpressive - continue strict I/Os  Stage IV chronic kidney disease - Continue diuresis as above - Follow creatinine/volume status  Type 2 diabetes mellitus - The patient will be placed on  supplemental coverage with NovoLog. - Continue sliding scale, hypoglycemic protocol  Dyslipidemia. - Continue statin therapy and Zetia.  Essential hypertension. - Continue antihypertensives.  Hypothyroidism. - Continue his Synthroid.  Paroxysmal atrial fibrillation currently in normal sinus rhythm and DVT prophylaxis. - Continue Eliquis  DVT prophylaxis: Eliquis Code Status: Full Family Communication: None available  Status is: Inpatient  Dispo: The patient is from: Home              Anticipated d/c is to: Home              Anticipated d/c date is: 48-72h              Patient currently NOT medically stable for discharge  Consultants:   None  Procedures:   None  Antimicrobials:  Remdesivir   Subjective: No acute issues/events overnight - respiratory status improving daily. Denies chest pain, diaphoresis, nausea, vomiting.  Objective: Vitals:   12/27/20 1600 12/27/20 2000 12/28/20 0042 12/28/20 0616  BP: 125/71 119/76 112/73 123/79  Pulse:   61 64  Resp: 15 18 19 20   Temp: 98.4 F (36.9 C) 97.9 F (36.6 C) (!) 97.5 F (36.4 C) 97.8 F (36.6 C)  TempSrc: Oral Oral Oral Oral  SpO2:  94% 92% 95%  Weight:   85.7 kg   Height:        Intake/Output Summary (Last 24 hours) at 12/28/2020 0749 Last data filed at 12/28/2020 0051 Gross per 24 hour  Intake 820 ml  Output 550 ml  Net 270 ml  Filed Weights   12/26/20 2320 12/27/20 0637 12/28/20 0042  Weight: 81.7 kg 81.5 kg 85.7 kg    Examination:  General:  Pleasantly resting in bed, No acute distress. HEENT:  Normocephalic atraumatic.  Sclerae nonicteric, noninjected.  Extraocular movements intact bilaterally. Neck:  Without mass or deformity.  Trachea is midline. Lungs:  Clear to auscultate bilaterally without rhonchi, wheeze, or rales. Heart:  Regular rate and rhythm.  Without murmurs, rubs, or gallops. Abdomen:  Soft, nontender, nondistended.  Without guarding or rebound. Extremities: Bilateral  lower extremity wrapped without overt pitting edema. Vascular:  Dorsalis pedis and posterior tibial pulses palpable bilaterally.  Data Reviewed: I have personally reviewed following labs and imaging studies  CBC: Recent Labs  Lab 12/26/20 1532 12/27/20 0359 12/28/20 0444  WBC 4.9 2.2* 3.0*  NEUTROABS 4.5 1.9 2.6  HGB 12.5* 12.9* 13.2  HCT 41.5 42.1 42.1  MCV 87.7 85.2 83.9  PLT 135* 107* 390   Basic Metabolic Panel: Recent Labs  Lab 12/26/20 1532 12/27/20 0359 12/28/20 0444  NA 143 143 143  K 4.9 5.3* 5.1  CL 109 111 110  CO2 19* 19* 22  GLUCOSE 151* 114* 124*  BUN 50* 54* 67*  CREATININE 2.74* 2.60* 2.67*  CALCIUM 8.0* 7.9* 7.7*   GFR: Estimated Creatinine Clearance: 21.5 mL/min (A) (by C-G formula based on SCr of 2.67 mg/dL (H)). Liver Function Tests: Recent Labs  Lab 12/26/20 1532 12/27/20 0359 12/28/20 0444  AST 46* 43* 35  ALT 17 16 14   ALKPHOS 72 68 68  BILITOT 1.2 0.8 0.8  PROT 5.3* 5.5* 5.0*  ALBUMIN 2.6* 2.4* 2.2*   No results for input(s): LIPASE, AMYLASE in the last 168 hours. No results for input(s): AMMONIA in the last 168 hours. Coagulation Profile: No results for input(s): INR, PROTIME in the last 168 hours. Cardiac Enzymes: No results for input(s): CKTOTAL, CKMB, CKMBINDEX, TROPONINI in the last 168 hours. BNP (last 3 results) No results for input(s): PROBNP in the last 8760 hours. HbA1C: No results for input(s): HGBA1C in the last 72 hours. CBG: Recent Labs  Lab 12/27/20 0622 12/27/20 0859 12/27/20 1119 12/27/20 2125 12/28/20 0614  GLUCAP 127* 124* 133* 148* 119*   Lipid Profile: Recent Labs    12/26/20 1532  TRIG 128   Thyroid Function Tests: No results for input(s): TSH, T4TOTAL, FREET4, T3FREE, THYROIDAB in the last 72 hours. Anemia Panel: Recent Labs    12/27/20 0359 12/28/20 0444  FERRITIN 378* 427*   Sepsis Labs: Recent Labs  Lab 12/26/20 1532 12/26/20 1732  PROCALCITON 0.25  --   LATICACIDVEN 3.9* 1.5     Recent Results (from the past 240 hour(s))  SARS Coronavirus 2 by RT PCR (hospital order, performed in [facility] hospital lab) Nasopharyngeal Nasopharyngeal Swab     Status: Abnormal   Collection Time: 12/26/20  3:45 PM   Specimen: Nasopharyngeal Swab  Result Value Ref Range Status   SARS Coronavirus 2 POSITIVE (A) NEGATIVE Final    Comment: RESULT CALLED TO, READ BACK BY AND VERIFIED WITH: A BANKS RN 1827 12/26/20 A BROWNING (NOTE) SARS-CoV-2 target nucleic acids are DETECTED  SARS-CoV-2 RNA is generally detectable in upper respiratory specimens  during the acute phase of infection.  Positive results are indicative  of the presence of the identified virus, but do not rule out bacterial infection or co-infection with other pathogens not detected by the test.  Clinical correlation with patient history and  other diagnostic information is necessary to determine patient  infection status.  The expected result is negative.  Fact Sheet for Patients:   StrictlyIdeas.no   Fact Sheet for Healthcare Providers:   BankingDealers.co.za    This test is not yet approved or cleared by the [place] FDA and  has been authorized for detection and/or diagnosis of SARS-CoV-2 by FDA under an Emergency Use Authorization (EUA).  This EUA will remain in effect (meaning this te st can be used) for the duration of  the COVID-19 declaration under Section 564(b)(1) of the Act, 21 U.S.C. section 360-bbb-3(b)(1), unless the authorization is terminated or revoked sooner.  Performed at [facility], [place] [address], [place], [place] [address]   Blood Culture (routine x 2)     Status: None (Preliminary result)   Collection Time: 12/26/20  3:48 PM   Specimen: BLOOD LEFT FOREARM  Result Value Ref Range Status   Specimen Description BLOOD LEFT FOREARM  Final   Special Requests   Final    BOTTLES DRAWN AEROBIC AND ANAEROBIC Blood Culture results may  not be optimal due to an inadequate volume of blood received in culture bottles   Culture   Final    NO GROWTH < 24 HOURS Performed at [facility], [place] [address] [place], [place] [address]    Report Status PENDING  Incomplete  Blood Culture (routine x 2)     Status: None (Preliminary result)   Collection Time: 12/26/20  4:37 PM   Specimen: BLOOD LEFT HAND  Result Value Ref Range Status   Specimen Description BLOOD LEFT HAND  Final   Special Requests   Final    BOTTLES DRAWN AEROBIC AND ANAEROBIC Blood Culture results may not be optimal due to an inadequate volume of blood received in culture bottles   Culture   Final    NO GROWTH < 24 HOURS Performed at [facility], [place] [address] [place], [place] [address]    Report Status PENDING  Incomplete   Radiology Studies: DG Chest Port 1 View  Result Date: 12/26/2020 CLINICAL DATA:  Chills and generalized weakness x2 days. EXAM: PORTABLE CHEST 1 VIEW COMPARISON:  August 23, 2020 FINDINGS: Multiple sternal wires and vascular clips are seen. Moderate to marked severity infiltrates are seen within the mid and lower lung fields, bilaterally. Mild, chronic appearing increased lung markings are also seen. There is no evidence of a pleural effusion or pneumothorax. Moderate to marked severity enlargement of the cardiac silhouette is noted. Degenerative changes seen throughout the thoracic spine. IMPRESSION: Cardiomegaly with moderate to marked severity bilateral infiltrates. Electronically Signed   By: Virgina Norfolk M.D.   On: 12/26/2020 16:22   Scheduled Meds: . amLODipine  5 mg Oral Daily  . apixaban  2.5 mg Oral BID  . baricitinib  1 mg Oral Daily  . cloNIDine  0.1 mg Oral BID  . ezetimibe  10 mg Oral Daily  . furosemide  60 mg Intravenous Q12H  . icosapent Ethyl  2 g Oral BID  . insulin aspart  0-20 Units Subcutaneous TID PC & HS  . insulin detemir  0.15 Units/kg Subcutaneous BID  . levothyroxine  100 mcg Oral Daily  .  linagliptin  5 mg Oral Daily  . methylPREDNISolone (SOLU-MEDROL) injection  1 mg/kg Intravenous Q12H   Followed by  . [START ON 12/29/2020] predniSONE  50 mg Oral Daily  . potassium chloride  10 mEq Oral BID  . rosuvastatin  20 mg Oral Daily   Continuous Infusions: .  remdesivir 100 mg in NS 100 mL 100 mg (12/27/20 1346)     LOS: 2 days   Time spent: 28min  Navia Lindahl C Tahiry Spicer, DO [facility]  If 7PM-7AM, please contact night-coverage [email or web address]  12/28/2020, 7:49 AM

## 2020-12-29 LAB — CBC WITH DIFFERENTIAL/PLATELET
Abs Immature Granulocytes: 0.01 10*3/uL (ref 0.00–0.07)
Basophils Absolute: 0 10*3/uL (ref 0.0–0.1)
Basophils Relative: 0 %
Eosinophils Absolute: 0 10*3/uL (ref 0.0–0.5)
Eosinophils Relative: 0 %
HCT: 39.1 % (ref 39.0–52.0)
Hemoglobin: 12.8 g/dL — ABNORMAL LOW (ref 13.0–17.0)
Immature Granulocytes: 0 %
Lymphocytes Relative: 4 %
Lymphs Abs: 0.2 10*3/uL — ABNORMAL LOW (ref 0.7–4.0)
MCH: 27.4 pg (ref 26.0–34.0)
MCHC: 32.7 g/dL (ref 30.0–36.0)
MCV: 83.7 fL (ref 80.0–100.0)
Monocytes Absolute: 0.1 10*3/uL (ref 0.1–1.0)
Monocytes Relative: 3 %
Neutro Abs: 4 10*3/uL (ref 1.7–7.7)
Neutrophils Relative %: 93 %
Platelets: 170 10*3/uL (ref 150–400)
RBC: 4.67 MIL/uL (ref 4.22–5.81)
RDW: 15.1 % (ref 11.5–15.5)
WBC: 4.3 10*3/uL (ref 4.0–10.5)
nRBC: 0 % (ref 0.0–0.2)

## 2020-12-29 LAB — COMPREHENSIVE METABOLIC PANEL
ALT: 17 U/L (ref 0–44)
AST: 36 U/L (ref 15–41)
Albumin: 2.2 g/dL — ABNORMAL LOW (ref 3.5–5.0)
Alkaline Phosphatase: 65 U/L (ref 38–126)
Anion gap: 9 (ref 5–15)
BUN: 71 mg/dL — ABNORMAL HIGH (ref 8–23)
CO2: 24 mmol/L (ref 22–32)
Calcium: 7.8 mg/dL — ABNORMAL LOW (ref 8.9–10.3)
Chloride: 111 mmol/L (ref 98–111)
Creatinine, Ser: 2.47 mg/dL — ABNORMAL HIGH (ref 0.61–1.24)
GFR, Estimated: 25 mL/min — ABNORMAL LOW (ref >60)
Glucose, Bld: 107 mg/dL — ABNORMAL HIGH (ref 70–99)
Potassium: 5.1 mmol/L (ref 3.5–5.1)
Sodium: 144 mmol/L (ref 135–145)
Total Bilirubin: 0.6 mg/dL (ref 0.3–1.2)
Total Protein: 5 g/dL — ABNORMAL LOW (ref 6.5–8.1)

## 2020-12-29 LAB — GLUCOSE, CAPILLARY
Glucose-Capillary: 104 mg/dL — ABNORMAL HIGH (ref 70–99)
Glucose-Capillary: 99 mg/dL (ref 70–99)
Glucose-Capillary: 126 mg/dL — ABNORMAL HIGH (ref 70–99)

## 2020-12-29 LAB — D-DIMER, QUANTITATIVE: D-Dimer, Quant: 1.96 ug/mL-FEU — ABNORMAL HIGH (ref 0.00–0.50)

## 2020-12-29 LAB — C-REACTIVE PROTEIN: CRP: 1 mg/dL — ABNORMAL HIGH (ref <1.0)

## 2020-12-29 NOTE — Progress Notes (Signed)
Assisted patient to stand for daily weight and ambulation. Desaturated to low 80s with effort of standing. Increased O2 to 10L for recovery and reduced to 5L when O2 sat reached high 90s. Standing weight obtained, but ambulation not performed due to safety concerns.

## 2020-12-29 NOTE — Progress Notes (Signed)
PROGRESS NOTE    [name]  IWL:[id number] DOB: 09-Aug-1932 DOA: 12/26/2020 PCP: [name], MD   Brief Narrative:   [name] a88 y.o.African-American malewith a known history of type 2 diabetes mellitus, hypertension, coronary artery.disease diastolic CHF with chronic respiratory failure on home O2 at 2L/min and chronic kidney disease, who presented to the emergency room with acute onset of worsening dyspnea and feeling sick over the last few days with poor appetite, subjective fever with fever noted by EMS and low pulse oximetry reported by EMS that was down to 91% on room air. The patient has received 2 COVID-19 vaccines. He denies any nausea or vomiting or diarrhea. He denied any loss of taste or smell. No significant cough or wheezing. No chest pain or palpitations. In the ED: Afebrile and tachypneic with positive COVID-19 PCR.Chest x-ray showed cardiomegaly with moderate to marked severe bilateral infiltrates. Admitted due to hypoxia and covid pneumonia.  12/28/21: UOP is not moving as much as I would expect. He says he's feeling better. His peripheral edema is improved. Checking ab [place].    Assessment & Plan:  Acute hypoxemic respiratory failure secondary to COVID-19     - CXR diffuse opacity consistent with covid pna     - continue Remdesivir, Solu-Medrol, Baricitinib     - IS, FV, follow  acute on chronic diastolic CHF.      - continue lasix IV for now     - peripheral edema is improved, belly is protuberant, will get [place] ab for ascites; UOP is 825cc today  Stage IV chronic kidney disease     - baseline Scr is ~2.4     - continuing diuresis, follow volume status, SCr  Type 2 diabetes mellitus     - SSI, levemir, DM diet, glucose checks, tradjenta  Dyslipidemia.     - crestor, zetia, vascepa  Essential hypertension.     - clonidine, lasix, amlodipine  Hypothyroidism.     - continue synthroid  Paroxysmal atrial fibrillation     -continue  Eliquis  DVT prophylaxis: eliquis Code Status: FULL Family Communication: None at bedside   Status is: Inpatient  Remains inpatient appropriate because:Inpatient level of care appropriate due to severity of illness   Dispo: The patient is from: Home              Anticipated d/c is to: Home              Anticipated d/c date is: 3 days              Patient currently is not medically stable to d/c.   Difficult to place patient No  Antimicrobials:  . remdesivir   Subjective: "I feel a little better."  Objective: Vitals:   12/28/20 1625 12/28/20 2105 12/29/20 0226 12/29/20 0800  BP: 117/69 117/62  (!) 141/83  Pulse: 67     Resp: 19 (!) 21  17  Temp: 97.8 F (36.6 C) (!) 97.5 F (36.4 C)    TempSrc: Oral Oral    SpO2:  91%  91%  Weight:   85.5 kg   Height:        Intake/Output Summary (Last 24 hours) at 12/29/2020 1302 Last data filed at 12/29/2020 0900 Gross per 24 hour  Intake 720 ml  Output 825 ml  Net -105 ml   Filed Weights   12/27/20 0637 12/28/20 0042 12/29/20 0226  Weight: 81.5 kg 85.7 kg 85.5 kg    Examination:  General: 87 y.o. male  resting in bed in NAD Eyes: PERRL, normal sclera ENMT: Nares patent w/o discharge, orophaynx clear, dentition normal, ears w/o discharge/lesions/ulcers Neck: Supple, trachea midline Cardiovascular: RRR, +S1, S2, no m/g/r, equal pulses throughout Respiratory: CTABL, no w/r/r, normal WOB GI: BS+, NDNT, no masses noted, no organomegaly noted MSK: RLE bandaging CDI, LLE was chronic changes, improved edema Skin: No rashes, bruises, ulcerations noted Neuro: A&O x 3, no focal deficits Psyc: Appropriate interaction and affect, calm/cooperative   Data Reviewed: I have personally reviewed following labs and imaging studies.  CBC: Recent Labs  Lab 12/26/20 1532 12/27/20 0359 12/28/20 0444 12/29/20 0330  WBC 4.9 2.2* 3.0* 4.3  NEUTROABS 4.5 1.9 2.6 4.0  HGB 12.5* 12.9* 13.2 12.8*  HCT 41.5 42.1 42.1 39.1  MCV 87.7 85.2  83.9 83.7  PLT 135* 107* 185 914   Basic Metabolic Panel: Recent Labs  Lab 12/26/20 1532 12/27/20 0359 12/28/20 0444 12/29/20 0330  NA 143 143 143 144  K 4.9 5.3* 5.1 5.1  CL 109 111 110 111  CO2 19* 19* 22 24  GLUCOSE 151* 114* 124* 107*  BUN 50* 54* 67* 71*  CREATININE 2.74* 2.60* 2.67* 2.47*  CALCIUM 8.0* 7.9* 7.7* 7.8*   GFR: Estimated Creatinine Clearance: 23.3 mL/min (A) (by C-G formula based on SCr of 2.47 mg/dL (H)). Liver Function Tests: Recent Labs  Lab 12/26/20 1532 12/27/20 0359 12/28/20 0444 12/29/20 0330  AST 46* 43* 35 36  ALT 17 16 14 17   ALKPHOS 72 68 68 65  BILITOT 1.2 0.8 0.8 0.6  PROT 5.3* 5.5* 5.0* 5.0*  ALBUMIN 2.6* 2.4* 2.2* 2.2*   No results for input(s): LIPASE, AMYLASE in the last 168 hours. No results for input(s): AMMONIA in the last 168 hours. Coagulation Profile: No results for input(s): INR, PROTIME in the last 168 hours. Cardiac Enzymes: No results for input(s): CKTOTAL, CKMB, CKMBINDEX, TROPONINI in the last 168 hours. BNP (last 3 results) No results for input(s): PROBNP in the last 8760 hours. HbA1C: No results for input(s): HGBA1C in the last 72 hours. CBG: Recent Labs  Lab 12/28/20 0614 12/28/20 1202 12/28/20 1619 12/28/20 2058 12/29/20 0855  GLUCAP 119* 167* 146* 110* 104*   Lipid Profile: Recent Labs    12/26/20 1532  TRIG 128   Thyroid Function Tests: No results for input(s): TSH, T4TOTAL, FREET4, T3FREE, THYROIDAB in the last 72 hours. Anemia Panel: Recent Labs    12/27/20 0359 12/28/20 0444  FERRITIN 378* 427*   Sepsis Labs: Recent Labs  Lab 12/26/20 1532 12/26/20 1732  PROCALCITON 0.25  --   LATICACIDVEN 3.9* 1.5    Recent Results (from the past 240 hour(s))  SARS Coronavirus 2 by RT PCR (hospital order, performed in [facility] hospital lab) Nasopharyngeal Nasopharyngeal Swab     Status: Abnormal   Collection Time: 12/26/20  3:45 PM   Specimen: Nasopharyngeal Swab  Result Value Ref Range  Status   SARS Coronavirus 2 POSITIVE (A) NEGATIVE Final    Comment: RESULT CALLED TO, READ BACK BY AND VERIFIED WITH: A BANKS RN 1827 12/26/20 A BROWNING (NOTE) SARS-CoV-2 target nucleic acids are DETECTED  SARS-CoV-2 RNA is generally detectable in upper respiratory specimens  during the acute phase of infection.  Positive results are indicative  of the presence of the identified virus, but do not rule out bacterial infection or co-infection with other pathogens not detected by the test.  Clinical correlation with patient history and  other diagnostic information is necessary to determine patient infection status.  The expected result is negative.  Fact Sheet for Patients:   StrictlyIdeas.no   Fact Sheet for Healthcare Providers:   BankingDealers.co.za    This test is not yet approved or cleared by the [place] FDA and  has been authorized for detection and/or diagnosis of SARS-CoV-2 by FDA under an Emergency Use Authorization (EUA).  This EUA will remain in effect (meaning this te st can be used) for the duration of  the COVID-19 declaration under Section 564(b)(1) of the Act, 21 U.S.C. section 360-bbb-3(b)(1), unless the authorization is terminated or revoked sooner.  Performed at [facility], [place] [address], [place], [place] [address]   Blood Culture (routine x 2)     Status: None (Preliminary result)   Collection Time: 12/26/20  3:48 PM   Specimen: BLOOD LEFT FOREARM  Result Value Ref Range Status   Specimen Description BLOOD LEFT FOREARM  Final   Special Requests   Final    BOTTLES DRAWN AEROBIC AND ANAEROBIC Blood Culture results may not be optimal due to an inadequate volume of blood received in culture bottles   Culture   Final    NO GROWTH 3 DAYS Performed at [facility], [place] [address], [place], [place] [address]    Report Status PENDING  Incomplete  Blood Culture (routine x 2)     Status: None  (Preliminary result)   Collection Time: 12/26/20  4:37 PM   Specimen: BLOOD LEFT HAND  Result Value Ref Range Status   Specimen Description BLOOD LEFT HAND  Final   Special Requests   Final    BOTTLES DRAWN AEROBIC AND ANAEROBIC Blood Culture results may not be optimal due to an inadequate volume of blood received in culture bottles   Culture   Final    NO GROWTH 3 DAYS Performed at [facility], [place] [address] [place], [place] [address]    Report Status PENDING  Incomplete      Radiology Studies: No results found.   Scheduled Meds: . amLODipine  5 mg Oral Daily  . apixaban  2.5 mg Oral BID  . baricitinib  1 mg Oral Daily  . cloNIDine  0.1 mg Oral BID  . ezetimibe  10 mg Oral Daily  . furosemide  60 mg Intravenous Q12H  . icosapent Ethyl  2 g Oral BID  . insulin aspart  0-20 Units Subcutaneous TID PC & HS  . insulin detemir  0.15 Units/kg Subcutaneous BID  . levothyroxine  100 mcg Oral Daily  . linagliptin  5 mg Oral Daily  . potassium chloride  10 mEq Oral BID  . predniSONE  50 mg Oral Daily  . rosuvastatin  20 mg Oral Daily   Continuous Infusions: . remdesivir 100 mg in NS 100 mL 100 mg (12/29/20 0914)     LOS: 3 days    Time spent: 25 minutes spent in the coordination of care today.    Jonnie Finner, DO [facility]  If 7PM-7AM, please contact night-coverage [email or web address] 12/29/2020, 1:02 PM

## 2020-12-30 ENCOUNTER — Inpatient Hospital Stay (HOSPITAL_COMMUNITY): Payer: Medicare Other

## 2020-12-30 LAB — CBC WITH DIFFERENTIAL/PLATELET
Abs Immature Granulocytes: 0.03 10*3/uL (ref 0.00–0.07)
Basophils Absolute: 0 10*3/uL (ref 0.0–0.1)
Basophils Relative: 0 %
Eosinophils Absolute: 0 10*3/uL (ref 0.0–0.5)
Eosinophils Relative: 0 %
HCT: 39.3 % (ref 39.0–52.0)
Hemoglobin: 12.7 g/dL — ABNORMAL LOW (ref 13.0–17.0)
Immature Granulocytes: 1 %
Lymphocytes Relative: 4 %
Lymphs Abs: 0.2 10*3/uL — ABNORMAL LOW (ref 0.7–4.0)
MCH: 27 pg (ref 26.0–34.0)
MCHC: 32.3 g/dL (ref 30.0–36.0)
MCV: 83.4 fL (ref 80.0–100.0)
Monocytes Absolute: 0.2 10*3/uL (ref 0.1–1.0)
Monocytes Relative: 4 %
Neutro Abs: 4.2 10*3/uL (ref 1.7–7.7)
Neutrophils Relative %: 91 %
Platelets: 156 10*3/uL (ref 150–400)
RBC: 4.71 MIL/uL (ref 4.22–5.81)
RDW: 14.8 % (ref 11.5–15.5)
WBC: 4.5 10*3/uL (ref 4.0–10.5)
nRBC: 0 % (ref 0.0–0.2)

## 2020-12-30 LAB — COMPREHENSIVE METABOLIC PANEL
ALT: 17 U/L (ref 0–44)
AST: 35 U/L (ref 15–41)
Albumin: 2.4 g/dL — ABNORMAL LOW (ref 3.5–5.0)
Alkaline Phosphatase: 71 U/L (ref 38–126)
Anion gap: 8 (ref 5–15)
BUN: 74 mg/dL — ABNORMAL HIGH (ref 8–23)
CO2: 23 mmol/L (ref 22–32)
Calcium: 7.8 mg/dL — ABNORMAL LOW (ref 8.9–10.3)
Chloride: 111 mmol/L (ref 98–111)
Creatinine, Ser: 2.31 mg/dL — ABNORMAL HIGH (ref 0.61–1.24)
GFR, Estimated: 27 mL/min — ABNORMAL LOW (ref >60)
Glucose, Bld: 109 mg/dL — ABNORMAL HIGH (ref 70–99)
Potassium: 4.8 mmol/L (ref 3.5–5.1)
Sodium: 142 mmol/L (ref 135–145)
Total Bilirubin: 0.6 mg/dL (ref 0.3–1.2)
Total Protein: 5.1 g/dL — ABNORMAL LOW (ref 6.5–8.1)

## 2020-12-30 LAB — GLUCOSE, CAPILLARY
Glucose-Capillary: 153 mg/dL — ABNORMAL HIGH (ref 70–99)
Glucose-Capillary: 109 mg/dL — ABNORMAL HIGH (ref 70–99)
Glucose-Capillary: 115 mg/dL — ABNORMAL HIGH (ref 70–99)
Glucose-Capillary: 148 mg/dL — ABNORMAL HIGH (ref 70–99)

## 2020-12-30 LAB — D-DIMER, QUANTITATIVE: D-Dimer, Quant: 2.32 ug/mL-FEU — ABNORMAL HIGH (ref 0.00–0.50)

## 2020-12-30 LAB — C-REACTIVE PROTEIN: CRP: 1.2 mg/dL — ABNORMAL HIGH (ref <1.0)

## 2020-12-30 MED ORDER — IPRATROPIUM-ALBUTEROL 20-100 MCG/ACT IN AERS
1.0000 | INHALATION_SPRAY | Freq: Four times a day (QID) | RESPIRATORY_TRACT | Status: DC
  Administered 2020-12-30 – 2020-12-31 (×2): 1 via RESPIRATORY_TRACT
  Filled 2020-12-30: qty 4

## 2020-12-30 NOTE — Progress Notes (Signed)
PROGRESS NOTE    [name]  QQI:[id number] DOB: [birth year] DOA: 12/26/2020 PCP: [name], MD   Brief Narrative:   [name] a88 y.o.African-American malewith a known history of type 2 diabetes mellitus, hypertension, coronary artery.disease diastolic CHF with chronic respiratory failure on home O2 at 2L/min and chronic kidney disease, who presented to the emergency room with acute onset of worsening dyspnea and feeling sick over the last few days with poor appetite, subjective fever with fever noted by EMS and low pulse oximetry reported by EMS that was down to 91% on room air. The patient has received 2 COVID-19 vaccines. He denies any nausea or vomiting or diarrhea. He denied any loss of taste or smell. No significant cough or wheezing. No chest pain or palpitations. In the ED: Afebrile and tachypneic with positive COVID-19 PCR.Chest x-ray showed cardiomegaly with moderate to marked severe bilateral infiltrates. Admitted due to hypoxia and covid pneumonia.  1/24: Good UOP yesterday. Not back to baseline O2 use.    Assessment & Plan: Acute hypoxemic respiratory failure secondary to COVID-19     - CXR diffuse opacity consistent with covid pna     - continue Solu-Medrol, Baricitinib; completes remdesivir today     - IS, FV, follow     - 1/24: still not back to baseline O2 use, add combivent  acute on chronic diastolic CHF.      - continue lasix IV for now     - peripheral edema is improved     - 1/24: [place] ab w/o ascites, UOP ok yesterday; still not back to baseline O2 use; will keep diuresis at current dosing through today, likely taper tomorrow  Stage IV chronic kidney disease     - baseline Scr is ~2.4     - continuing diuresis, follow volume status     - 1/24: Still at baseline Scr, volume status is good, will keep diuresis at current dosing through today, likely taper tomorrow  Type 2 diabetes mellitus     - SSI, levemir, DM diet, glucose checks, tradjenta      - 1/24: glucose is ok  Dyslipidemia.     - crestor, zetia, vascepa  Essential hypertension.     - clonidine, lasix, amlodipine  Hypothyroidism.     - continue synthroid  Paroxysmal atrial fibrillation     -continue Eliquis  Debility Weakness     - PT to eval  DVT prophylaxis: eliquis Code Status: FULL Family Communication: Attempted call to [name], received VM only.   Status is: Inpatient  Remains inpatient appropriate because:Inpatient level of care appropriate due to severity of illness   Dispo: The patient is from: Home              Anticipated d/c is to: Home              Anticipated d/c date is: 2 days              Patient currently is not medically stable to d/c.   Difficult to place patient No  Subjective: "They brought my wife in here last night."  Objective: Vitals:   12/29/20 2053 12/30/20 0600 12/30/20 1000 12/30/20 1200  BP: 125/66  126/82 123/76  Pulse: 67  67 66  Resp: (!) 22  18 17   Temp: 97.8 F (36.6 C)  98.2 F (36.8 C) 98 F (36.7 C)  TempSrc: Oral  Oral Oral  SpO2: 94%  90% 94%  Weight:  85.9 kg  Height:        Intake/Output Summary (Last 24 hours) at 12/30/2020 1406 Last data filed at 12/30/2020 1304 Gross per 24 hour  Intake 983 ml  Output 1550 ml  Net -567 ml   Filed Weights   12/28/20 0042 12/29/20 0226 12/30/20 0600  Weight: 85.7 kg 85.5 kg 85.9 kg    Examination:  General: 87 y.o. male resting in bed in NAD Eyes: PERRL, normal sclera ENMT: Nares patent w/o discharge, orophaynx clear, dentition normal, ears w/o discharge/lesions/ulcers Neck: Supple, trachea midline Cardiovascular: RRR, +S1, S2, no m/g/r, equal pulses throughout Respiratory: CTABL, no w/r/r, normal WOB GI: BS+, NDNT, no masses noted, no organomegaly noted MSK: RLE bandaging CDI no c/c Neuro: A&O x 3, no focal deficits Psyc: Appropriate interaction and affect, calm/cooperative   Data Reviewed: I have personally reviewed  following labs and imaging studies.  CBC: Recent Labs  Lab 12/26/20 1532 12/27/20 0359 12/28/20 0444 12/29/20 0330 12/30/20 0352  WBC 4.9 2.2* 3.0* 4.3 4.5  NEUTROABS 4.5 1.9 2.6 4.0 4.2  HGB 12.5* 12.9* 13.2 12.8* 12.7*  HCT 41.5 42.1 42.1 39.1 39.3  MCV 87.7 85.2 83.9 83.7 83.4  PLT 135* 107* 185 170 973   Basic Metabolic Panel: Recent Labs  Lab 12/26/20 1532 12/27/20 0359 12/28/20 0444 12/29/20 0330 12/30/20 0352  NA 143 143 143 144 142  K 4.9 5.3* 5.1 5.1 4.8  CL 109 111 110 111 111  CO2 19* 19* 22 24 23   GLUCOSE 151* 114* 124* 107* 109*  BUN 50* 54* 67* 71* 74*  CREATININE 2.74* 2.60* 2.67* 2.47* 2.31*  CALCIUM 8.0* 7.9* 7.7* 7.8* 7.8*   GFR: Estimated Creatinine Clearance: 24.9 mL/min (A) (by C-G formula based on SCr of 2.31 mg/dL (H)). Liver Function Tests: Recent Labs  Lab 12/26/20 1532 12/27/20 0359 12/28/20 0444 12/29/20 0330 12/30/20 0352  AST 46* 43* 35 36 35  ALT 17 16 14 17 17   ALKPHOS 72 68 68 65 71  BILITOT 1.2 0.8 0.8 0.6 0.6  PROT 5.3* 5.5* 5.0* 5.0* 5.1*  ALBUMIN 2.6* 2.4* 2.2* 2.2* 2.4*   No results for input(s): LIPASE, AMYLASE in the last 168 hours. No results for input(s): AMMONIA in the last 168 hours. Coagulation Profile: No results for input(s): INR, PROTIME in the last 168 hours. Cardiac Enzymes: No results for input(s): CKTOTAL, CKMB, CKMBINDEX, TROPONINI in the last 168 hours. BNP (last 3 results) No results for input(s): PROBNP in the last 8760 hours. HbA1C: No results for input(s): HGBA1C in the last 72 hours. CBG: Recent Labs  Lab 12/29/20 0855 12/29/20 1749 12/29/20 2052 12/30/20 0849 12/30/20 1309  GLUCAP 104* 99 126* 109* 115*   Lipid Profile: No results for input(s): CHOL, HDL, LDLCALC, TRIG, CHOLHDL, LDLDIRECT in the last 72 hours. Thyroid Function Tests: No results for input(s): TSH, T4TOTAL, FREET4, T3FREE, THYROIDAB in the last 72 hours. Anemia Panel: Recent Labs    12/28/20 0444  FERRITIN 427*    Sepsis Labs: Recent Labs  Lab 12/26/20 1532 12/26/20 1732  PROCALCITON 0.25  --   LATICACIDVEN 3.9* 1.5    Recent Results (from the past 240 hour(s))  SARS Coronavirus 2 by RT PCR (hospital order, performed in [facility] hospital lab) Nasopharyngeal Nasopharyngeal Swab     Status: Abnormal   Collection Time: 12/26/20  3:45 PM   Specimen: Nasopharyngeal Swab  Result Value Ref Range Status   SARS Coronavirus 2 POSITIVE (A) NEGATIVE Final    Comment: RESULT CALLED TO, READ BACK  BY AND VERIFIED WITH: A BANKS RN [id number] 12/26/20 A BROWNING (NOTE) SARS-CoV-2 target nucleic acids are DETECTED  SARS-CoV-2 RNA is generally detectable in upper respiratory specimens  during the acute phase of infection.  Positive results are indicative  of the presence of the identified virus, but do not rule out bacterial infection or co-infection with other pathogens not detected by the test.  Clinical correlation with patient history and  other diagnostic information is necessary to determine patient infection status.  The expected result is negative.  Fact Sheet for Patients:   StrictlyIdeas.no   Fact Sheet for Healthcare Providers:   BankingDealers.co.za    This test is not yet approved or cleared by the [place] FDA and  has been authorized for detection and/or diagnosis of SARS-CoV-2 by FDA under an Emergency Use Authorization (EUA).  This EUA will remain in effect (meaning this te st can be used) for the duration of  the COVID-19 declaration under Section 564(b)(1) of the Act, 21 U.S.C. section 360-bbb-3(b)(1), unless the authorization is terminated or revoked sooner.  Performed at [facility], [place] [address] [place], [place] [address]   Blood Culture (routine x 2)     Status: None (Preliminary result)   Collection Time: 12/26/20  3:48 PM   Specimen: BLOOD LEFT FOREARM  Result Value Ref Range Status   Specimen Description BLOOD  LEFT FOREARM  Final   Special Requests   Final    BOTTLES DRAWN AEROBIC AND ANAEROBIC Blood Culture results may not be optimal due to an inadequate volume of blood received in culture bottles   Culture   Final    NO GROWTH 3 DAYS Performed at [facility], [place] [address], [place], [place] [address]    Report Status PENDING  Incomplete  Blood Culture (routine x 2)     Status: None (Preliminary result)   Collection Time: 12/26/20  4:37 PM   Specimen: BLOOD LEFT HAND  Result Value Ref Range Status   Specimen Description BLOOD LEFT HAND  Final   Special Requests   Final    BOTTLES DRAWN AEROBIC AND ANAEROBIC Blood Culture results may not be optimal due to an inadequate volume of blood received in culture bottles   Culture   Final    NO GROWTH 3 DAYS Performed at [facility], [place] [address] [place], [place] [address]    Report Status PENDING  Incomplete      Radiology Studies: US Abdomen Limited  Result Date: 12/30/2020 CLINICAL DATA:  Ascites search EXAM: LIMITED ABDOMEN ULTRASOUND FOR ASCITES TECHNIQUE: Limited ultrasound survey for ascites was performed in all four abdominal quadrants. COMPARISON:  Abdomen and pelvis CT 11/23/2017 FINDINGS: Small volume ascites with dominant pocket in the left lower quadrant measured at up to 7 x 12 x 3 cm. The fluid has simple characteristics. No fluid dilated small bowel was seen. IMPRESSION: Very small volume and simple appearing ascites. Electronically Signed   By: Monte Fantasia M.D.   On: 12/30/2020 05:34     Scheduled Meds: . amLODipine  5 mg Oral Daily  . apixaban  2.5 mg Oral BID  . baricitinib  1 mg Oral Daily  . cloNIDine  0.1 mg Oral BID  . ezetimibe  10 mg Oral Daily  . furosemide  60 mg Intravenous Q12H  . icosapent Ethyl  2 g Oral BID  . insulin aspart  0-20 Units Subcutaneous TID PC & HS  . insulin detemir  0.15 Units/kg  Subcutaneous BID  . levothyroxine  100 mcg Oral Daily  . linagliptin  5 mg Oral Daily  .  potassium chloride  10 mEq Oral BID  . predniSONE  50 mg Oral Daily  . rosuvastatin  20 mg Oral Daily   Continuous Infusions:   LOS: 4 days    Time spent: 25 minutes spent in the coordination of care today.    Jonnie Finner, DO [facility]  If 7PM-7AM, please contact night-coverage [email or web address] 12/30/2020, 2:06 PM

## 2020-12-30 NOTE — Plan of Care (Signed)
  Problem: Education: Goal: Knowledge of risk factors and measures for prevention of condition will improve Outcome: Progressing   

## 2020-12-30 NOTE — TOC Initial Note (Addendum)
Transition of Care [facility]) - Initial/Assessment Note    Patient Details  Name: [name] MRN: [id number] Date of Birth: 1931-12-08  Transition of Care [facility]) CM/SW Contact:    Zenon Mayo, RN Phone Number: 12/30/2020, 3:56 PM  Clinical Narrative:                 NCM spoke with patient, he states his youngest daughter lives with he and his wife and looks after them both.  He states his wife is also in the hospital right now.  He states his daughter takes them to [facility], he has no issues with getting his medications. He has home oxygen but an not remember the name of the agency he has it thru.  He states he has a walker at home but does not use it. Patient states he has a Nurse that comes out to see him but he is not sure which agency she is with,he gives this NCM permission to contact his daughter, [name],  [place] called and left vm for return call.  TOC team will continue to follow.  Expected Discharge Plan: [place] Barriers to Discharge: Continued Medical Work up   Patient Goals and CMS Choice Patient states their goals for this hospitalization and ongoing recovery are:: get better      Expected Discharge Plan and Services Expected Discharge Plan: [place] In-house Referral: NA Discharge Planning Services: CM Consult   Living arrangements for the past 2 months: Single Family Home                   DME Agency: NA                  Prior Living Arrangements/Services Living arrangements for the past 2 months: Single Family Home Lives with:: [place] Patient language and need for interpreter reviewed:: Yes Do you feel safe going back to the place where you live?: Yes      Need for Family Participation in Patient Care: Yes (Comment) Care giver support system in place?: Yes (comment) Current home services: DME (has home oxygen, (3liters), hs a walker,) Criminal Activity/Legal Involvement Pertinent to Current  Situation/Hospitalization: No - Comment as needed  Activities of Daily Living Home Assistive Devices/Equipment: Cane (specify quad or straight) ADL Screening (condition at time of admission) Patient's cognitive ability adequate to safely complete daily activities?: No Is the patient deaf or have difficulty hearing?: Yes Does the patient have difficulty seeing, even when wearing glasses/contacts?: No Does the patient have difficulty concentrating, remembering, or making decisions?: No Patient able to express need for assistance with ADLs?: No Does the patient have difficulty dressing or bathing?: No Independently performs ADLs?: Yes (appropriate for developmental age) Does the patient have difficulty walking or climbing stairs?: No Weakness of Legs: None Weakness of Arms/Hands: None  Permission Sought/Granted                  Emotional Assessment   Attitude/Demeanor/Rapport: Engaged Affect (typically observed): Appropriate Orientation: : Oriented to Self,Oriented to Place,Oriented to  Time,Oriented to Situation Alcohol / Substance Use: Not Applicable Psych Involvement: No (comment)  Admission diagnosis:  Acute respiratory failure with hypoxia ([place]) [J96.01] Acute hypoxemic respiratory failure ([place]) [J96.01] COVID-19 [U07.1] Patient Active Problem List   Diagnosis Date Noted  . Acute hypoxemic respiratory failure ([place]) 12/26/2020  . Atrial fibrillation ([place])   . Acute on chronic right heart failure ([place]) 08/24/2020  . Diabetes ([place]) 07/21/2020  .  OSA (obstructive sleep apnea) 05/15/2019  . Acute on chronic diastolic heart failure ([place]) 05/06/2018  . Pleural effusion 05/06/2018  . COPD (chronic obstructive pulmonary disease) with emphysema ([place]) 04/29/2017  . Lung nodule 04/29/2017  . Hx of CABG 04/08/2017  . Chronic respiratory failure with hypoxia ([place]) 03/23/2017  . Pulmonary hypertension ([place]) 03/22/2017  . Shortness of breath 03/22/2017  . Pericardial effusion  03/22/2017  . Chronic diastolic heart failure ([place]) 02/25/2017  . Acute on chronic renal failure ([place]) 11/18/2016  . Renal mass s/p right partial nephrectomy 02/19/2017 11/18/2016  . Dyslipidemia 05/12/2013  . Essential hypertension 01/14/2012   PCP:  [name], MD Pharmacy:   [facility] [place], [address] [place] [address] Phone: [phone] Fax: [phone]     Social Determinants of Health (SDOH) Interventions    Readmission Risk Interventions Readmission Risk Prevention Plan 12/30/2020  Transportation Screening Complete  Medication Review ([place]) Complete  HRI or [place] Complete  SW Recovery Care/Counseling Consult Complete  [place] Not Applicable  Some recent data might be hidden

## 2020-12-31 ENCOUNTER — Other Ambulatory Visit: Payer: Self-pay | Admitting: *Deleted

## 2020-12-31 LAB — COMPREHENSIVE METABOLIC PANEL
ALT: 19 U/L (ref 0–44)
AST: 31 U/L (ref 15–41)
Albumin: 2.3 g/dL — ABNORMAL LOW (ref 3.5–5.0)
Alkaline Phosphatase: 71 U/L (ref 38–126)
Anion gap: 11 (ref 5–15)
BUN: 71 mg/dL — ABNORMAL HIGH (ref 8–23)
CO2: 24 mmol/L (ref 22–32)
Calcium: 7.8 mg/dL — ABNORMAL LOW (ref 8.9–10.3)
Chloride: 107 mmol/L (ref 98–111)
Creatinine, Ser: 2.18 mg/dL — ABNORMAL HIGH (ref 0.61–1.24)
GFR, Estimated: 29 mL/min — ABNORMAL LOW (ref >60)
Glucose, Bld: 155 mg/dL — ABNORMAL HIGH (ref 70–99)
Potassium: 4.8 mmol/L (ref 3.5–5.1)
Sodium: 142 mmol/L (ref 135–145)
Total Bilirubin: 1.1 mg/dL (ref 0.3–1.2)
Total Protein: 5 g/dL — ABNORMAL LOW (ref 6.5–8.1)

## 2020-12-31 LAB — CBC WITH DIFFERENTIAL/PLATELET
Abs Immature Granulocytes: 0.04 10*3/uL (ref 0.00–0.07)
Basophils Absolute: 0 10*3/uL (ref 0.0–0.1)
Basophils Relative: 0 %
Eosinophils Absolute: 0 10*3/uL (ref 0.0–0.5)
Eosinophils Relative: 0 %
HCT: 39.7 % (ref 39.0–52.0)
Hemoglobin: 12.9 g/dL — ABNORMAL LOW (ref 13.0–17.0)
Immature Granulocytes: 1 %
Lymphocytes Relative: 4 %
Lymphs Abs: 0.2 10*3/uL — ABNORMAL LOW (ref 0.7–4.0)
MCH: 27.2 pg (ref 26.0–34.0)
MCHC: 32.5 g/dL (ref 30.0–36.0)
MCV: 83.6 fL (ref 80.0–100.0)
Monocytes Absolute: 0.3 10*3/uL (ref 0.1–1.0)
Monocytes Relative: 5 %
Neutro Abs: 4.6 10*3/uL (ref 1.7–7.7)
Neutrophils Relative %: 90 %
Platelets: 153 10*3/uL (ref 150–400)
RBC: 4.75 MIL/uL (ref 4.22–5.81)
RDW: 15.1 % (ref 11.5–15.5)
WBC: 5.1 10*3/uL (ref 4.0–10.5)
nRBC: 0 % (ref 0.0–0.2)

## 2020-12-31 LAB — CULTURE, BLOOD (ROUTINE X 2)
Culture: NO GROWTH
Culture: NO GROWTH

## 2020-12-31 LAB — GLUCOSE, CAPILLARY
Glucose-Capillary: 129 mg/dL — ABNORMAL HIGH (ref 70–99)
Glucose-Capillary: 88 mg/dL (ref 70–99)
Glucose-Capillary: 83 mg/dL (ref 70–99)

## 2020-12-31 LAB — D-DIMER, QUANTITATIVE: D-Dimer, Quant: 2.66 ug/mL-FEU — ABNORMAL HIGH (ref 0.00–0.50)

## 2020-12-31 LAB — C-REACTIVE PROTEIN: CRP: 1.4 mg/dL — ABNORMAL HIGH (ref <1.0)

## 2020-12-31 MED ORDER — IPRATROPIUM-ALBUTEROL 20-100 MCG/ACT IN AERS: 1.0000 | INHALATION_SPRAY | Freq: Four times a day (QID) | RESPIRATORY_TRACT | Status: DC | PRN

## 2020-12-31 MED ORDER — FUROSEMIDE 10 MG/ML IJ SOLN
60.0000 mg | Freq: Every day | INTRAMUSCULAR | Status: DC
  Administered 2021-01-01 – 2021-01-02 (×2): 60 mg via INTRAVENOUS
  Filled 2020-12-31 (×2): qty 6

## 2020-12-31 MED ORDER — IPRATROPIUM-ALBUTEROL 20-100 MCG/ACT IN AERS
1.0000 | INHALATION_SPRAY | Freq: Three times a day (TID) | RESPIRATORY_TRACT | Status: DC
  Administered 2020-12-31 (×3): 1 via RESPIRATORY_TRACT

## 2020-12-31 NOTE — TOC Progression Note (Addendum)
Transition of Care [facility]) - Progression Note    Patient Details  Name: [name] MRN: [id number] Date of Birth: 05-20-1932  Transition of [facility]) CM/SW Contact  Sherrilyn Rist Transition of Care Supervisor Phone Number: [phone] 12/31/2020, 12:05 PM  Clinical Narrative:    Received message from [place] with [facility]; patient is active with [facility] as prior to admission. Patient's spouse is hospitalized also ( 5W); Active with [facility] as prior to admission.  TOC will continue to follow for progression of care.   Expected Discharge Plan: [place] Barriers to Discharge: Continued Medical Work up  Expected Discharge Plan and Services Expected Discharge Plan: [place] In-house Referral: NA Discharge Planning Services: CM Consult   Living arrangements for the past 2 months: Single Family Home                   DME Agency: NA                   Social Determinants of Health (SDOH) Interventions    Readmission Risk Interventions Readmission Risk Prevention Plan 12/30/2020  Transportation Screening Complete  Medication Review [employer]) Complete  HRI or [place] Complete  SW Recovery Care/Counseling Consult Complete  Palliative Care Screening Not [place] Not Applicable  Some recent data might be hidden

## 2020-12-31 NOTE — Progress Notes (Signed)
PROGRESS NOTE    [name]  IZT:[id number] DOB: 05/23/1932 DOA: 12/26/2020 PCP: [name], MD   Brief Narrative:   [name] a88 y.o.African-American malewith a known history of type 2 diabetes mellitus, hypertension, coronary artery.disease diastolic CHF with chronic respiratory failure on home O2 at 2L/min and chronic kidney disease, who presented to the emergency room with acute onset of worsening dyspnea and feeling sick over the last few days with poor appetite, subjective fever with fever noted by EMS and low pulse oximetry reported by EMS that was down to 91% on room air. The patient has received 2 COVID-19 vaccines. He denies any nausea or vomiting or diarrhea. He denied any loss of taste or smell. No significant cough or wheezing. No chest pain or palpitations. In the ED: Afebrile and tachypneic with positive COVID-19 PCR.Chest x-ray showed cardiomegaly with moderate to marked severe bilateral infiltrates. Admitted due to hypoxia and covid pneumonia.  1/25: Still not back to baseline O2 use. Continuing diuresis.  Assessment & Plan: Acute hypoxemic respiratory failure secondary to COVID-19 - CXR diffuse opacity consistent with covid pna - continue Solu-Medrol, Baricitinib; completes remdesivir today - IS, FV, follow     - 1/25: combivent added, still on 4L [place]; he may be establishing a new baseline as he says he is feeling better  acute on chronic diastolic CHF.  - continue lasix IV for now - peripheral edema is improved     - [place] ab w/o ascites     - 1/25: still not back to baseline O2 use; he may be establishing a new baseline, Scr is improved, will taper lasix to daily dosing  Stage IV chronic kidney disease - baseline Scr is ~2.4 - continuing diuresis, follow volume status     - 1/25: Still at baseline Scr, volume status is good, taper lasix to qday dosing  Type 2 diabetes mellitus - SSI, levemir, DM diet, glucose  checks, tradjenta     - 1/25: glucose is acceptable  Dyslipidemia. - crestor, zetia, vascepa  Essential hypertension. - clonidine, lasix, amlodipine  Hypothyroidism. - continue synthroid  Paroxysmal atrial fibrillation -continue Eliquis  Debility Weakness     - awaiting PT to eval  DVT prophylaxis: eliquis Code Status: FULL Family Communication: None at bedside   Status is: Inpatient  Remains inpatient appropriate because:Inpatient level of care appropriate due to severity of illness   Dispo: The patient is from: Home              Anticipated d/c is to: Home              Anticipated d/c date is: 2 days              Patient currently is not medically stable to d/c.   Difficult to place patient No  Subjective: "I'm ok."  Objective: Vitals:   12/30/20 1200 12/30/20 1635 12/30/20 2300 12/31/20 0540  BP: 123/76 125/81 127/70 123/79  Pulse: 66 71 (!) 57   Resp: 17 19 18 18   Temp: 98 F (36.7 C) 97.6 F (36.4 C) (!) 97.4 F (36.3 C) (!) 97.5 F (36.4 C)  TempSrc: Oral Oral Oral Oral  SpO2: 94% 91% 96% 95%  Weight:    87.9 kg  Height:        Intake/Output Summary (Last 24 hours) at 12/31/2020 1259 Last data filed at 12/31/2020 1200 Gross per 24 hour  Intake 380 ml  Output 1000 ml  Net -620 ml   [employer]  12/29/20 0226 12/30/20 0600 12/31/20 0540  Weight: 85.5 kg 85.9 kg 87.9 kg    Examination:  General: 87 y.o. male resting in bed in NAD Eyes: PERRL, normal sclera ENMT: Nares patent w/o discharge, orophaynx clear, dentition normal, ears w/o discharge/lesions/ulcers Neck: Supple, trachea midline Cardiovascular: RRR, +S1, S2, no m/g/r, equal pulses throughout Respiratory: CTABL, no w/r/r, normal WOB GI: BS+, NDNT, no masses noted, no organomegaly noted MSK: No e/c/c; RLE bandage CDI Skin: No rashes, bruises, ulcerations noted Neuro: A&O x 3, no focal deficits Psyc: Appropriate interaction and affect,  calm/cooperative   Data Reviewed: I have personally reviewed following labs and imaging studies.  CBC: Recent Labs  Lab 12/27/20 0359 12/28/20 0444 12/29/20 0330 12/30/20 0352 12/31/20 0633  WBC 2.2* 3.0* 4.3 4.5 5.1  NEUTROABS 1.9 2.6 4.0 4.2 4.6  HGB 12.9* 13.2 12.8* 12.7* 12.9*  HCT 42.1 42.1 39.1 39.3 39.7  MCV 85.2 83.9 83.7 83.4 83.6  PLT 107* 185 170 156 784   Basic Metabolic Panel: Recent Labs  Lab 12/27/20 0359 12/28/20 0444 12/29/20 0330 12/30/20 0352 12/31/20 0633  NA 143 143 144 142 142  K 5.3* 5.1 5.1 4.8 4.8  CL 111 110 111 111 107  CO2 19* 22 24 23 24   GLUCOSE 114* 124* 107* 109* 155*  BUN 54* 67* 71* 74* 71*  CREATININE 2.60* 2.67* 2.47* 2.31* 2.18*  CALCIUM 7.9* 7.7* 7.8* 7.8* 7.8*   GFR: Estimated Creatinine Clearance: 26.7 mL/min (A) (by C-G formula based on SCr of 2.18 mg/dL (H)). Liver Function Tests: Recent Labs  Lab 12/27/20 0359 12/28/20 0444 12/29/20 0330 12/30/20 0352 12/31/20 0633  AST 43* 35 36 35 31  ALT 16 14 17 17 19   ALKPHOS 68 68 65 71 71  BILITOT 0.8 0.8 0.6 0.6 1.1  PROT 5.5* 5.0* 5.0* 5.1* 5.0*  ALBUMIN 2.4* 2.2* 2.2* 2.4* 2.3*   No results for input(s): LIPASE, AMYLASE in the last 168 hours. No results for input(s): AMMONIA in the last 168 hours. Coagulation Profile: No results for input(s): INR, PROTIME in the last 168 hours. Cardiac Enzymes: No results for input(s): CKTOTAL, CKMB, CKMBINDEX, TROPONINI in the last 168 hours. BNP (last 3 results) No results for input(s): PROBNP in the last 8760 hours. HbA1C: No results for input(s): HGBA1C in the last 72 hours. CBG: Recent Labs  Lab 12/29/20 2052 12/30/20 0849 12/30/20 1309 12/30/20 1822 12/31/20 0905  GLUCAP 126* 109* 115* 148* 129*   Lipid Profile: No results for input(s): CHOL, HDL, LDLCALC, TRIG, CHOLHDL, LDLDIRECT in the last 72 hours. Thyroid Function Tests: No results for input(s): TSH, T4TOTAL, FREET4, T3FREE, THYROIDAB in the last 72  hours. Anemia Panel: No results for input(s): VITAMINB12, FOLATE, FERRITIN, TIBC, IRON, RETICCTPCT in the last 72 hours. Sepsis Labs: Recent Labs  Lab 12/26/20 1532 12/26/20 1732  PROCALCITON 0.25  --   LATICACIDVEN 3.9* 1.5    Recent Results (from the past 240 hour(s))  SARS Coronavirus 2 by RT PCR (hospital order, performed in [facility] hospital lab) Nasopharyngeal Nasopharyngeal Swab     Status: Abnormal   Collection Time: 12/26/20  3:45 PM   Specimen: Nasopharyngeal Swab  Result Value Ref Range Status   SARS Coronavirus 2 POSITIVE (A) NEGATIVE Final    Comment: RESULT CALLED TO, READ BACK BY AND VERIFIED WITH: A BANKS RN 1827 12/26/20 A BROWNING (NOTE) SARS-CoV-2 target nucleic acids are DETECTED  SARS-CoV-2 RNA is generally detectable in upper respiratory specimens  during the acute phase of infection.  Positive results are indicative  of the presence of the identified virus, but do not rule out bacterial infection or co-infection with other pathogens not detected by the test.  Clinical correlation with patient history and  other diagnostic information is necessary to determine patient infection status.  The expected result is negative.  Fact Sheet for Patients:   StrictlyIdeas.no   Fact Sheet for Healthcare Providers:   BankingDealers.co.za    This test is not yet approved or cleared by the [place] FDA and  has been authorized for detection and/or diagnosis of SARS-CoV-2 by FDA under an Emergency Use Authorization (EUA).  This EUA will remain in effect (meaning this te st can be used) for the duration of  the COVID-19 declaration under Section 564(b)(1) of the Act, 21 U.S.C. section 360-bbb-3(b)(1), unless the authorization is terminated or revoked sooner.  Performed at [facility], [place] [address] [place], [place] [address]   Blood Culture (routine x 2)     Status: None (Preliminary result)    Collection Time: 12/26/20  3:48 PM   Specimen: BLOOD LEFT FOREARM  Result Value Ref Range Status   Specimen Description BLOOD LEFT FOREARM  Final   Special Requests   Final    BOTTLES DRAWN AEROBIC AND ANAEROBIC Blood Culture results may not be optimal due to an inadequate volume of blood received in culture bottles   Culture   Final    NO GROWTH 4 DAYS Performed at [facility], [place] [address] [place], [place] [address]    Report Status PENDING  Incomplete  Blood Culture (routine x 2)     Status: None (Preliminary result)   Collection Time: 12/26/20  4:37 PM   Specimen: BLOOD LEFT HAND  Result Value Ref Range Status   Specimen Description BLOOD LEFT HAND  Final   Special Requests   Final    BOTTLES DRAWN AEROBIC AND ANAEROBIC Blood Culture results may not be optimal due to an inadequate volume of blood received in culture bottles   Culture   Final    NO GROWTH 4 DAYS Performed at [facility], [place] [address], [place], [place] [address]    Report Status PENDING  Incomplete      Radiology Studies: US Abdomen Limited  Result Date: 12/30/2020 CLINICAL DATA:  Ascites search EXAM: LIMITED ABDOMEN ULTRASOUND FOR ASCITES TECHNIQUE: Limited ultrasound survey for ascites was performed in all four abdominal quadrants. COMPARISON:  Abdomen and pelvis CT 11/23/2017 FINDINGS: Small volume ascites with dominant pocket in the left lower quadrant measured at up to 7 x 12 x 3 cm. The fluid has simple characteristics. No fluid dilated small bowel was seen. IMPRESSION: Very small volume and simple appearing ascites. Electronically Signed   By: Monte Fantasia M.D.   On: 12/30/2020 05:34     Scheduled Meds: . amLODipine  5 mg Oral Daily  . apixaban  2.5 mg Oral BID  . baricitinib  1 mg Oral Daily  . cloNIDine  0.1 mg Oral BID  . ezetimibe  10 mg Oral Daily  . furosemide  60 mg Intravenous Q12H  . icosapent Ethyl  2 g Oral BID  . insulin aspart  0-20 Units Subcutaneous TID PC & HS   . insulin detemir  0.15 Units/kg Subcutaneous BID  . Ipratropium-Albuterol  1 puff Inhalation TID  . levothyroxine  100 mcg Oral Daily  . linagliptin  5 mg Oral Daily  . potassium chloride  10 mEq Oral BID  .  predniSONE  50 mg Oral Daily  . rosuvastatin  20 mg Oral Daily   Continuous Infusions:   LOS: 5 days    Time spent: 25 minutes spent in the coordination of care today.    Jonnie Finner, DO [facility]  If 7PM-7AM, please contact night-coverage [email or web address] 12/31/2020, 12:59 PM

## 2020-12-31 NOTE — Care Management Important Message (Signed)
Important Message  Patient Details  Name: RIGHTEOUS CLAIBORNE MRN: [id number] Date of Birth: [birth year]   Medicare Important Message Given:  Yes     Shelda Altes 12/31/2020, 10:36 AM

## 2020-12-31 NOTE — Patient Outreach (Signed)
[facility]) Care Management  12/31/2020  [name] 07-12-1932 [id number]  Care coordination  Initial Referral received : 08/15/20 Referral source:Hospital Liaision Referral reason: Complex care and disease management of Heart failure . Insurance: Medicare   Subjective  Noted patient admission on 12/26/20, notified [facility] Liaison of admission. Patient active with [place] [facility] and [facility] prior to admission .    Plan Will await patient discharge disposition plans for follow up.    Joylene Draft, RN, BSN  [place] Management Coordinator  [phone]- Mobile [phone]- Toll Free Main Office

## 2020-12-31 NOTE — Plan of Care (Signed)
  Problem: Education: Goal: Knowledge of risk factors and measures for prevention of condition will improve Outcome: Progressing   

## 2020-12-31 NOTE — Evaluation (Signed)
Physical Therapy Evaluation Patient Details Name: [name] MRN: [id number] DOB: July 20, 1932 Today's Date: 12/31/2020   History of Present Illness  Pt is a 87 y.o. M with significant PMH of type 2 diabetes mellitus, HTN, CAD, CHF with chronic respiratory failure on home O2 at 2L/min and CKD who presents with acute hypoxemic respiratory failure secondary to COVID-19. CXR diffuse opacity consistent with Covid PNA.  Clinical Impression  Prior to admission, pt lives with his spouse and is independent. Of note, pt wife also currently hospitalized. Pt presents with decreased cardiopulmonary endurance. Desaturation to 85% on 3L O2 upon sitting edge of bed; able to maintain > 92% on 4-6L O2. Pt ambulating 30 ft with a walker at a min guard assist level. Encouraged out of bed to chair daily. See below for recommendations.    Follow Up Recommendations Home health PT;Supervision for mobility/OOB (could potentially benefit from [facility] for increased support initially)    Equipment Recommendations  None recommended by PT    Recommendations for Other Services       Precautions / Restrictions Precautions Precautions: Fall;Other (comment) Precaution Comments: watch O2 Restrictions Weight Bearing Restrictions: No      Mobility  Bed Mobility Overal bed mobility: Needs Assistance Bed Mobility: Supine to Sit     Supine to sit: Supervision          Transfers Overall transfer level: Needs assistance Equipment used: Rolling walker (2 wheeled) Transfers: Sit to/from Stand Sit to Stand: Min guard            Ambulation/Gait Ambulation/Gait assistance: Min guard Gait Distance (Feet): 30 Feet Assistive device: Rolling walker (2 wheeled) Gait Pattern/deviations: Step-through pattern;Decreased stride length;Trunk flexed Gait velocity: decreased   General Gait Details: Min guard for safety, no overt LOB noted  Stairs            Wheelchair Mobility    Modified Rankin  (Stroke Patients Only)       Balance Overall balance assessment: Needs assistance Sitting-balance support: Feet supported Sitting balance-Leahy Scale: Good     Standing balance support: Bilateral upper extremity supported Standing balance-Leahy Scale: Poor Standing balance comment: reliant on external support                             Pertinent Vitals/Pain Pain Assessment: No/denies pain    Home Living Family/patient expects to be discharged to:: Private residence Living Arrangements: Spouse/significant other Available Help at Discharge: Family;Available 24 hours/day (daughter) Type of Home: House Home Access: Stairs to enter   [employer] of Steps: 2-3 Home Layout: One level Home Equipment: Walker - 2 wheels;Shower seat      Prior Function Level of Independence: Independent               Hand Dominance   Dominant Hand: Right    Extremity/Trunk Assessment   Upper Extremity Assessment Upper Extremity Assessment: Overall WFL for tasks assessed    Lower Extremity Assessment Lower Extremity Assessment: Overall WFL for tasks assessed    Cervical / Trunk Assessment Cervical / Trunk Assessment: Kyphotic  Communication   Communication: HOH  Cognition Arousal/Alertness: Awake/alert Behavior During Therapy: WFL for tasks assessed/performed Overall Cognitive Status: Difficult to assess                                 General Comments: Appropriate for basic mobility; very [facility]  General Comments      Exercises     Assessment/Plan    PT Assessment Patient needs continued PT services  PT Problem List Decreased strength;Decreased activity tolerance;Decreased mobility;Decreased balance;Cardiopulmonary status limiting activity       PT Treatment Interventions DME instruction;Gait training;Functional mobility training;Therapeutic activities;Therapeutic exercise;Balance training;Patient/family education    PT Goals  (Current goals can be found in the Care Plan section)  Acute Rehab PT Goals Patient Stated Goal: did not state PT Goal Formulation: With patient Time For Goal Achievement: 01/14/21 Potential to Achieve Goals: Good    Frequency Min 3X/week   Barriers to discharge        Co-evaluation               AM-PAC PT "6 Clicks" Mobility  Outcome Measure Help needed turning from your back to your side while in a flat bed without using bedrails?: None Help needed moving from lying on your back to sitting on the side of a flat bed without using bedrails?: None Help needed moving to and from a bed to a chair (including a wheelchair)?: A Little Help needed standing up from a chair using your arms (e.g., wheelchair or bedside chair)?: A Little Help needed to walk in hospital room?: A Little Help needed climbing 3-5 steps with a railing? : A Little 6 Click Score: 20    End of Session Equipment Utilized During Treatment: Gait belt;Oxygen Activity Tolerance: Patient tolerated treatment well Patient left: in chair;with call bell/phone within reach;with chair alarm set Nurse Communication: Mobility status PT Visit Diagnosis: Unsteadiness on feet (R26.81);Difficulty in walking, not elsewhere classified (R26.2)    Time: 2263-3354 PT Time Calculation (min) (ACUTE ONLY): 34 min   Charges:   PT Evaluation $PT Eval Moderate Complexity: 1 Mod PT Treatments $Therapeutic Activity: 8-22 mins        Wyona Almas, PT, DPT Acute Rehabilitation Services Pager [phone] Office [phone]   Deno Etienne 12/31/2020, 2:23 PM

## 2020-12-31 NOTE — Consult Note (Signed)
   [facility] Inpatient Consult   12/31/2020  [name] 01-Apr-1932 [id number]  [facility] [ACO] Patient:  Medicare   Patient is currently active with [place] [facility] for chronic disease management services.  Patient has been engaged by a [place] Management Coordinator.  Our community based plan of care has focused on disease management and community resource support and was informed by [facility] RNCM that patient was also active with [facility] prior to admission.  Reviewed inpatient [facility] RNCM notes for needs and disposition.    Patient will receive a post hospital call and will be evaluated for assessments and disease process education.    Plan: Updated  Inpatient Transition Of Care [TOC] team member to make aware that [place] Management following.   Of note, [facility] Care Management services does not replace or interfere with any services that are needed or arranged by inpatient [facility] care management team.  For additional questions or referrals please contact:  Natividad Brood, RN BSN [facility] Liaison  [phone] business mobile phone Toll free office [phone]  Fax number: [phone] [email or web address]

## 2021-01-01 ENCOUNTER — Ambulatory Visit: Payer: Self-pay | Admitting: *Deleted

## 2021-01-01 LAB — GLUCOSE, CAPILLARY
Glucose-Capillary: 152 mg/dL — ABNORMAL HIGH (ref 70–99)
Glucose-Capillary: 161 mg/dL — ABNORMAL HIGH (ref 70–99)
Glucose-Capillary: 135 mg/dL — ABNORMAL HIGH (ref 70–99)
Glucose-Capillary: 133 mg/dL — ABNORMAL HIGH (ref 70–99)

## 2021-01-01 MED ORDER — IPRATROPIUM-ALBUTEROL 20-100 MCG/ACT IN AERS
1.0000 | INHALATION_SPRAY | Freq: Two times a day (BID) | RESPIRATORY_TRACT | Status: DC
  Administered 2021-01-01 – 2021-01-02 (×2): 1 via RESPIRATORY_TRACT

## 2021-01-01 MED ORDER — POTASSIUM CHLORIDE CRYS ER 10 MEQ PO TBCR
10.0000 meq | EXTENDED_RELEASE_TABLET | Freq: Every day | ORAL | Status: DC
  Administered 2021-01-02: 10 meq via ORAL
  Filled 2021-01-01: qty 1

## 2021-01-01 NOTE — Progress Notes (Signed)
SATURATION QUALIFICATIONS: (This note is used to comply with regulatory documentation for home oxygen)  Patient Saturations on Room Air at Rest = 85%  Patient Saturations on Room Air while Ambulating = %  Patient Saturations on 3 Liters of oxygen while Ambulating = 87%  Please briefly explain why patient needs home oxygen:Sob and quickly desats with 3-5 feet of walking recovers at rest.

## 2021-01-01 NOTE — Evaluation (Signed)
Occupational Therapy Evaluation Patient Details Name: [name] MRN: [id number] DOB: 1932/08/13 Today's Date: 01/01/2021    History of Present Illness Pt is a 87 y.o. M with significant PMH of type 2 diabetes mellitus, HTN, CAD, CHF with chronic respiratory failure on home O2 at 2L/min and CKD who presents with acute hypoxemic respiratory failure secondary to COVID-19. CXR diffuse opacity consistent with Covid PNA.   Clinical Impression   Patient admitted for the diagnosis above.  PTA he was fairly independent with mobility and ADL care.  Currently die to the deficits listed below, he is requiring up to Min A for basic transfers and up to Mod A for lower body ADL.  OT indicated in the acute setting to maximize functional status prior to discharging home with [facility] OT as needed.      Follow Up Recommendations  Home health OT    Equipment Recommendations  3 in 1 bedside commode    Recommendations for Other Services       Precautions / Restrictions Precautions Precautions: Fall Precaution Comments: watch O2 Restrictions Weight Bearing Restrictions: No      Mobility Bed Mobility Overal bed mobility: Needs Assistance Bed Mobility: Supine to Sit     Supine to sit: Supervision          Transfers Overall transfer level: Needs assistance Equipment used: Rolling walker (2 wheeled) Transfers: Sit to/from Stand;Stand Pivot Transfers Sit to Stand: Min assist Stand pivot transfers: Min guard;From elevated surface            Balance Overall balance assessment: Needs assistance Sitting-balance support: Feet supported Sitting balance-Leahy Scale: Good     Standing balance support: Bilateral upper extremity supported Standing balance-Leahy Scale: Poor Standing balance comment: reliant on external support                           ADL either performed or assessed with clinical judgement   ADL Overall ADL's : Needs assistance/impaired     Grooming: Oral  care;Supervision/safety;Sitting   Upper Body Bathing: Minimal assistance;Sitting   Lower Body Bathing: Moderate assistance;Sitting/lateral leans   Upper Body Dressing : Minimal assistance;Sitting   Lower Body Dressing: Moderate assistance;Sit to/from stand   Toilet Transfer: Minimal Insurance claims handler Details (indicate cue type and reason): assist for power up from sitting         Functional mobility during ADLs: Min guard General ADL Comments: RW     Vision Patient Visual Report: No change from baseline       Perception     Praxis      Pertinent Vitals/Pain Pain Assessment: Faces Faces Pain Scale: Hurts a little bit Pain Location: bottom Pain Descriptors / Indicators: Aching Pain Intervention(s): Monitored during session     Hand Dominance Right   Extremity/Trunk Assessment Upper Extremity Assessment Upper Extremity Assessment: Overall WFL for tasks assessed   Lower Extremity Assessment Lower Extremity Assessment: Defer to PT evaluation   Cervical / Trunk Assessment Cervical / Trunk Assessment: Kyphotic   Communication Communication Communication: HOH   Cognition Arousal/Alertness: Awake/alert Behavior During Therapy: WFL for tasks assessed/performed Overall Cognitive Status: Within Functional Limits for tasks assessed                                     General Comments   O2 sat dropped to 87% on high flow.  Exercises     Shoulder Instructions      Home Living Family/patient expects to be discharged to:: Private residence Living Arrangements: Spouse/significant other Available Help at Discharge: Family;Available 24 hours/day Type of Home: House Home Access: Stairs to enter [employer] of Steps: 2-3   Home Layout: One level     Bathroom Shower/Tub: Occupational psychologist: Standard Bathroom Accessibility: Yes How Accessible: Accessible via walker Home Equipment: [place] - 2 wheels;Shower  seat          Prior Functioning/Environment Level of Independence: Independent                 OT Problem List: Decreased strength;Decreased activity tolerance;Impaired balance (sitting and/or standing);Increased edema      OT Treatment/Interventions: Self-care/ADL training;Therapeutic exercise;DME and/or AE instruction;Balance training;Therapeutic activities    OT Goals(Current goals can be found in the care plan section) Acute Rehab OT Goals Patient Stated Goal: Feel a little better OT Goal Formulation: With patient Time For Goal Achievement: 01/01/21 Potential to Achieve Goals: Good ADL Goals Pt Will Perform Grooming: with modified independence;sitting;standing Pt Will Perform Lower Body Bathing: with supervision;sit to/from stand Pt Will Perform Lower Body Dressing: with supervision;sit to/from stand Pt Will Transfer to Toilet: with supervision;ambulating;regular height toilet Pt Will Perform Toileting - Clothing Manipulation and hygiene: with supervision;sit to/from stand  OT Frequency: Min 2X/week   Barriers to D/C:    none noted       Co-evaluation              AM-PAC OT "6 Clicks" Daily Activity     Outcome Measure Help from another person eating meals?: None Help from another person taking care of personal grooming?: A Little Help from another person toileting, which includes using toliet, bedpan, or urinal?: A Little Help from another person bathing (including washing, rinsing, drying)?: A Lot Help from another person to put on and taking off regular upper body clothing?: A Little Help from another person to put on and taking off regular lower body clothing?: A Lot 6 Click Score: 17   End of Session Equipment Utilized During Treatment: Rolling walker;Oxygen Nurse Communication: Mobility status  Activity Tolerance: Patient tolerated treatment well Patient left: in chair;with call bell/phone within reach;with chair alarm set  OT Visit Diagnosis:  Unsteadiness on feet (R26.81);Muscle weakness (generalized) (M62.81)                Time: 0254-2706 OT Time Calculation (min): 18 min Charges:  OT General Charges $OT Visit: 1 Visit OT Evaluation $OT Eval Moderate Complexity: 1 Mod  01/01/2021  Rich, OTR/L  Acute Rehabilitation Services  Office:  [phone]   Metta Clines 01/01/2021, 10:48 AM

## 2021-01-01 NOTE — Progress Notes (Signed)
PROGRESS NOTE    [name]  VFI:[id number] DOB: 02-03-1932 DOA: 12/26/2020 PCP: [name], MD   Brief Narrative:    [name] a88 y.o.African-American malewith a known history of type 2 diabetes mellitus, hypertension, coronary artery.disease diastolic CHF with chronic respiratory failure on home O2 at 2L/min and chronic kidney disease, who presented to the emergency room with acute onset of worsening dyspnea and feeling sick over the last few days with poor appetite, subjective fever with fever noted by EMS and low pulse oximetry reported by EMS that was down to 91% on room air. The patient has received 2 COVID-19 vaccines. He denies any nausea or vomiting or diarrhea. He denied any loss of taste or smell. No significant cough or wheezing. No chest pain or palpitations. In the ED: Afebrile and tachypneic with positive COVID-19 PCR.Chest x-ray showed cardiomegaly with moderate to marked severe bilateral infiltrates. Admitted due to hypoxia and covid pneumonia.  Subjective: -He reports generalized weakness, fatigue, reports some cough.  Assessment & Plan:  Acute hypoxemic respiratory failure secondary to COVID-19 -He is vaccinated, but not boosted - CXR diffuse opacity consistent with covid pna - continue Solu-Medrol, Baricitinib; completed remdesivir. -He was encouraged use incentive spirometry and flutter valve. -Is with oxygen requirement, on 4 L nasal cannula today. -We will try to ambulate see what his oxygen requirement with activity.  acute on chronic diastolic CHF.  - continue lasix IV for now - peripheral edema is improved - [place] ab w/o ascites - 1/25: still not back to baseline O2 use; he may be establishing a new baseline, Scr is improved, will taper lasix to daily dosing  Stage IV chronic kidney disease - baseline Scr is ~2.4 - continuing diuresis, follow volume status     - 1/25: Still at baseline Scr, volume status is good, taper lasix to qday  dosing  Type 2 diabetes mellitus - SSI, levemir, DM diet, glucose checks, tradjenta     - 1/25: glucose is acceptable  Dyslipidemia. - crestor, zetia, vascepa  Essential hypertension. - clonidine, lasix, amlodipine  Hypothyroidism. - continue synthroid  Paroxysmal atrial fibrillation -continue Eliquis  Debility Weakness     - awaiting PT to eval  DVT prophylaxis: eliquis Code Status: FULL Family Communication: Patient reports his wife is hospitalized due to COVID-19 as well. Status is: Inpatient  Remains inpatient appropriate because:Inpatient level of care appropriate due to severity of illness   Dispo: The patient is from: Home              Anticipated d/c is to: Home              Anticipated d/c date is: 2 days              Patient currently is not medically stable to d/c.   Difficult to place patient No    Objective: Vitals:   12/31/20 1947 01/01/21 0457 01/01/21 0800 01/01/21 1037  BP: 131/79 139/74    Pulse: 61     Resp: 19 (!) 21    Temp: 98.1 F (36.7 C) 97.6 F (36.4 C) 97.7 F (36.5 C) (!) 97.5 F (36.4 C)  TempSrc: Oral Oral Oral Oral  SpO2: 96% 94%    Weight:  86.3 kg    Height:        Intake/Output Summary (Last 24 hours) at 01/01/2021 1114 Last data filed at 01/01/2021 1037 Gross per 24 hour  Intake 1080 ml  Output 1475 ml  Net -395 ml   Filed  Weights   12/30/20 0600 12/31/20 0540 01/01/21 0457  Weight: 85.9 kg 87.9 kg 86.3 kg    Examination:  Awake Alert, Oriented X 3, extremely frail and deconditioned no new F.N deficits, Normal affect Symmetrical Chest wall movement, Good air movement bilaterally, scattered rales. RRR,No Gallops,Rubs or new Murmurs, No Parasternal Heave +ve B.Sounds, Abd Soft, No tenderness, No rebound - guarding or rigidity. No Cyanosis, Clubbing , left leg +1 edema, right lower extremity with Ace wrapped   Data Reviewed: I have personally reviewed following labs and imaging  studies.  CBC: Recent Labs  Lab 12/27/20 0359 12/28/20 0444 12/29/20 0330 12/30/20 0352 12/31/20 0633  WBC 2.2* 3.0* 4.3 4.5 5.1  NEUTROABS 1.9 2.6 4.0 4.2 4.6  HGB 12.9* 13.2 12.8* 12.7* 12.9*  HCT 42.1 42.1 39.1 39.3 39.7  MCV 85.2 83.9 83.7 83.4 83.6  PLT 107* 185 170 156 756   Basic Metabolic Panel: Recent Labs  Lab 12/27/20 0359 12/28/20 0444 12/29/20 0330 12/30/20 0352 12/31/20 0633  NA 143 143 144 142 142  K 5.3* 5.1 5.1 4.8 4.8  CL 111 110 111 111 107  CO2 19* 22 24 23 24   GLUCOSE 114* 124* 107* 109* 155*  BUN 54* 67* 71* 74* 71*  CREATININE 2.60* 2.67* 2.47* 2.31* 2.18*  CALCIUM 7.9* 7.7* 7.8* 7.8* 7.8*   GFR: Estimated Creatinine Clearance: 26.5 mL/min (A) (by C-G formula based on SCr of 2.18 mg/dL (H)). Liver Function Tests: Recent Labs  Lab 12/27/20 0359 12/28/20 0444 12/29/20 0330 12/30/20 0352 12/31/20 0633  AST 43* 35 36 35 31  ALT 16 14 17 17 19   ALKPHOS 68 68 65 71 71  BILITOT 0.8 0.8 0.6 0.6 1.1  PROT 5.5* 5.0* 5.0* 5.1* 5.0*  ALBUMIN 2.4* 2.2* 2.2* 2.4* 2.3*   No results for input(s): LIPASE, AMYLASE in the last 168 hours. No results for input(s): AMMONIA in the last 168 hours. Coagulation Profile: No results for input(s): INR, PROTIME in the last 168 hours. Cardiac Enzymes: No results for input(s): CKTOTAL, CKMB, CKMBINDEX, TROPONINI in the last 168 hours. BNP (last 3 results) No results for input(s): PROBNP in the last 8760 hours. HbA1C: No results for input(s): HGBA1C in the last 72 hours. CBG: Recent Labs  Lab 12/31/20 0905 12/31/20 1835 12/31/20 2143 01/01/21 0804 01/01/21 1035  GLUCAP 129* 88 83 152* 161*   Lipid Profile: No results for input(s): CHOL, HDL, LDLCALC, TRIG, CHOLHDL, LDLDIRECT in the last 72 hours. Thyroid Function Tests: No results for input(s): TSH, T4TOTAL, FREET4, T3FREE, THYROIDAB in the last 72 hours. Anemia Panel: No results for input(s): VITAMINB12, FOLATE, FERRITIN, TIBC, IRON, RETICCTPCT in  the last 72 hours. Sepsis Labs: Recent Labs  Lab 12/26/20 1532 12/26/20 1732  PROCALCITON 0.25  --   LATICACIDVEN 3.9* 1.5    Recent Results (from the past 240 hour(s))  SARS Coronavirus 2 by RT PCR (hospital order, performed in [facility] hospital lab) Nasopharyngeal Nasopharyngeal Swab     Status: Abnormal   Collection Time: 12/26/20  3:45 PM   Specimen: Nasopharyngeal Swab  Result Value Ref Range Status   SARS Coronavirus 2 POSITIVE (A) NEGATIVE Final    Comment: RESULT CALLED TO, READ BACK BY AND VERIFIED WITH: A BANKS RN 1827 12/26/20 A BROWNING (NOTE) SARS-CoV-2 target nucleic acids are DETECTED  SARS-CoV-2 RNA is generally detectable in upper respiratory specimens  during the acute phase of infection.  Positive results are indicative  of the presence of the identified virus, but do not  rule out bacterial infection or co-infection with other pathogens not detected by the test.  Clinical correlation with patient history and  other diagnostic information is necessary to determine patient infection status.  The expected result is negative.  Fact Sheet for Patients:   StrictlyIdeas.no   Fact Sheet for Healthcare Providers:   BankingDealers.co.za    This test is not yet approved or cleared by the [place] FDA and  has been authorized for detection and/or diagnosis of SARS-CoV-2 by FDA under an Emergency Use Authorization (EUA).  This EUA will remain in effect (meaning this te st can be used) for the duration of  the COVID-19 declaration under Section 564(b)(1) of the Act, 21 U.S.C. section 360-bbb-3(b)(1), unless the authorization is terminated or revoked sooner.  Performed at [facility], [place] [address], [place], [place] [address]   Blood Culture (routine x 2)     Status: None   Collection Time: 12/26/20  3:48 PM   Specimen: BLOOD LEFT FOREARM  Result Value Ref Range Status   Specimen Description BLOOD LEFT  FOREARM  Final   Special Requests   Final    BOTTLES DRAWN AEROBIC AND ANAEROBIC Blood Culture results may not be optimal due to an inadequate volume of blood received in culture bottles   Culture   Final    NO GROWTH 5 DAYS Performed at [facility], [place] [address], [place], [place] [address]    Report Status 12/31/2020 FINAL  Final  Blood Culture (routine x 2)     Status: None   Collection Time: 12/26/20  4:37 PM   Specimen: BLOOD LEFT HAND  Result Value Ref Range Status   Specimen Description BLOOD LEFT HAND  Final   Special Requests   Final    BOTTLES DRAWN AEROBIC AND ANAEROBIC Blood Culture results may not be optimal due to an inadequate volume of blood received in culture bottles   Culture   Final    NO GROWTH 5 DAYS Performed at [facility], [place] [address], [place], [place] [address]    Report Status 12/31/2020 FINAL  Final      Radiology Studies: No results found.   Scheduled Meds: . amLODipine  5 mg Oral Daily  . apixaban  2.5 mg Oral BID  . baricitinib  1 mg Oral Daily  . cloNIDine  0.1 mg Oral BID  . ezetimibe  10 mg Oral Daily  . furosemide  60 mg Intravenous Daily  . icosapent Ethyl  2 g Oral BID  . insulin aspart  0-20 Units Subcutaneous TID PC & HS  . insulin detemir  0.15 Units/kg Subcutaneous BID  . Ipratropium-Albuterol  1 puff Inhalation BID  . levothyroxine  100 mcg Oral Daily  . linagliptin  5 mg Oral Daily  . [START ON 01/02/2021] potassium chloride  10 mEq Oral Daily  . predniSONE  50 mg Oral Daily  . rosuvastatin  20 mg Oral Daily   Continuous Infusions:   LOS: 6 days     Phillips Climes, MD [facility]  If 7PM-7AM, please contact night-coverage [email or web address] 01/01/2021, 11:14 AM

## 2021-01-01 NOTE — Care Management (Signed)
Confirmed with [facility] liaison that patient is active for services. If patient will DC to home, please place resumption orders. Marland Kitchen

## 2021-01-02 LAB — GLUCOSE, CAPILLARY
Glucose-Capillary: 95 mg/dL (ref 70–99)
Glucose-Capillary: 120 mg/dL — ABNORMAL HIGH (ref 70–99)

## 2021-01-02 MED ORDER — POTASSIUM CHLORIDE ER 10 MEQ PO TBCR: 10.0000 meq | EXTENDED_RELEASE_TABLET | Freq: Two times a day (BID) | ORAL | 3 refills | Status: AC

## 2021-01-02 MED ORDER — DEXAMETHASONE 6 MG PO TABS: 6.0000 mg | ORAL_TABLET | Freq: Every day | ORAL | 0 refills | Status: AC

## 2021-01-02 MED ORDER — BENAZEPRIL HCL 40 MG PO TABS: 40.0000 mg | ORAL_TABLET | Freq: Every day | ORAL | Status: AC

## 2021-01-02 NOTE — Significant Event (Signed)
Pt up to the chair desating to 80s on 5L of HFNC. Regaining sats sitting up in chair eating breakfast.

## 2021-01-02 NOTE — Progress Notes (Signed)
Physical Therapy Treatment Patient Details Name: [name] MRN: [id number] DOB: [birth year] Today's Date: 01/02/2021    History of Present Illness Pt is a 87 y.o. M with significant PMH of type 2 diabetes mellitus, HTN, CAD, CHF with chronic respiratory failure on home O2 at 2L/min and CKD who presents with acute hypoxemic respiratory failure secondary to COVID-19. CXR diffuse opacity consistent with Covid PNA.    PT Comments    Pt up in chair on arrival to agreeable to participate with therapy. He is slow to respond to questions, unsure if this is cognitive in nature or due to [facility]. Pt required less assist with transfers this session and increased ambulation distance. SpO2 dropped to 82% after ambulation and recovered to 92% with ~2 min rest, PLB, and O2 increased from 3L to 5L. Will continue to follow acutely.    Follow Up Recommendations  Home health PT;Supervision for mobility/OOB     Equipment Recommendations  None recommended by PT    Recommendations for Other Services       Precautions / Restrictions Precautions Precautions: Fall Precaution Comments: watch O2 Restrictions Weight Bearing Restrictions: No    Mobility  Bed Mobility               General bed mobility comments: up in chair on arrival  Transfers Overall transfer level: Needs assistance Equipment used: Rolling walker (2 wheeled) Transfers: Sit to/from Stand Sit to Stand: Min guard         General transfer comment: min guard to rise from recliner chair. Increased time and effort with use of arm rests.  Ambulation/Gait Ambulation/Gait assistance: Min guard Gait Distance (Feet): 48 Feet (24 forward, 24 backwards) Assistive device: Rolling walker (2 wheeled) Gait Pattern/deviations: Step-through pattern;Decreased stride length;Trunk flexed Gait velocity: decreased   General Gait Details: Min guard for safety. Distance limited secondary to O2 lines. SpO2 dropped to 82% during ambulation  requiring ~2 min seated with PLB to recover.   Stairs             Wheelchair Mobility    Modified Rankin (Stroke Patients Only)       Balance Overall balance assessment: Needs assistance Sitting-balance support: Feet supported Sitting balance-Leahy Scale: Good     Standing balance support: Bilateral upper extremity supported Standing balance-Leahy Scale: Poor Standing balance comment: reliant on external support                            Cognition Arousal/Alertness: Awake/alert Behavior During Therapy: Flat affect;WFL for tasks assessed/performed Overall Cognitive Status: Within Functional Limits for tasks assessed                                 General Comments: Appropriate for basic mobility; very HOH.      Exercises General Exercises - Lower Extremity Ankle Circles/Pumps: AROM;Both;10 reps;Seated Long Arc Quad: AROM;Both;10 reps;Seated Heel Slides: AROM;Both;10 reps;Seated Hip ABduction/ADduction: AROM;Both;10 reps;Seated    General Comments        Pertinent Vitals/Pain Pain Assessment: No/denies pain    Home Living                      Prior Function            PT Goals (current goals Joseph now be found in the care plan section) Acute Rehab PT Goals Patient Stated Goal: Feel a little better PT  Goal Formulation: With patient Time For Goal Achievement: 01/14/21 Potential to Achieve Goals: Good Progress towards PT goals: Progressing toward goals    Frequency    Min 3X/week      PT Plan Current plan remains appropriate    Co-evaluation              AM-PAC PT "6 Clicks" Mobility   Outcome Measure  Help needed turning from your back to your side while in a flat bed without using bedrails?: None Help needed moving from lying on your back to sitting on the side of a flat bed without using bedrails?: None Help needed moving to and from a bed to a chair (including a wheelchair)?: A Little Help needed  standing up from a chair using your arms (e.g., wheelchair or bedside chair)?: A Little Help needed to walk in hospital room?: A Little Help needed climbing 3-5 steps with a railing? : A Little 6 Click Score: 20    End of Session Equipment Utilized During Treatment: Gait belt;Oxygen Activity Tolerance: Patient tolerated treatment well Patient left: in chair;with call bell/phone within reach;with chair alarm set Nurse Communication: Mobility status PT Visit Diagnosis: Unsteadiness on feet (R26.81);Difficulty in walking, not elsewhere classified (R26.2)     Time: 9357-0177 PT Time Calculation (min) (ACUTE ONLY): 21 min  Charges:  $Gait Training: 8-22 mins                    Benjiman Core, [place] Pager [phone] Acute Rehab  Allena Katz 01/02/2021, 11:19 AM

## 2021-01-02 NOTE — Discharge Summary (Signed)
[name], is a 87 y.o. male  DOB 10/18/1932  MRN [id number].  Admission date:  12/26/2020  Admitting Physician  Christel Mormon, MD  Discharge Date:  01/02/2021   Primary MD  [name], MD  Recommendations for primary care physician for things to follow:  -Please check CBC, CMP during next visit .  Admission Diagnosis  Acute respiratory failure with hypoxia (HCC) [J96.01] Acute hypoxemic respiratory failure ([place]) [J96.01] COVID-19 [U07.1]   Discharge Diagnosis  Acute respiratory failure with hypoxia ([place]) [J96.01] Acute hypoxemic respiratory failure ([place]) [J96.01] COVID-19 [U07.1]    Active Problems:   Acute hypoxemic respiratory failure (HCC)      Past Medical History:  Diagnosis Date  . Chronic kidney disease    tumor right kidney  . Coronary artery disease   . Diabetes mellitus    type 2  . Hypertension   . Myocardial infarction ([place])    1997  . Right renal mass     Past Surgical History:  Procedure Laterality Date  . CARDIAC CATHETERIZATION  1996  . CARDIOVERSION N/A 08/29/2020   Procedure: CARDIOVERSION;  Surgeon: Pixie Casino, MD;  Location: [facility] ENDOSCOPY;  Service: Cardiovascular;  Laterality: N/A;  . CORONARY ARTERY BYPASS GRAFT    . DOPPLER ECHOCARDIOGRAPHY  2011  . HERNIA REPAIR    . IR GENERIC HISTORICAL  12/09/2016   IR RADIOLOGIST EVAL & MGMT 12/09/2016 [name], MD [facility] INTERV RAD  . ROBOTIC ASSITED PARTIAL NEPHRECTOMY Right 02/19/2017   Procedure: XI ROBOTIC ASSITED PARTIAL NEPHRECTOMY;  Surgeon: [name], MD;  Location: [facility] ORS;  Service: Urology;  Laterality: Right;  . TEE WITHOUT CARDIOVERSION N/A 08/29/2020   Procedure: TRANSESOPHAGEAL ECHOCARDIOGRAM (TEE);  Surgeon: Pixie Casino, MD;  Location: [facility] ENDOSCOPY;  Service: Cardiovascular;  Laterality: N/A;       History of present illness and  Hospital Course:     Kindly see H&P for history of  present illness and admission details, please review complete Labs, Consult reports and Test reports for all details in brief  HPI  from the history and physical done on the day of admission 12/26/2020  [name]  is a 87 y.o. African-American male with a known history of type 2 diabetes mellitus, hypertension, coronary artery. disease diastolic CHF with chronic respiratory failure on home O2 at 2 L/min and chronic kidney disease, who presented to the emergency room with acute onset of worsening dyspnea and feeling sick over the last few days with poor appetite, subjective fever with fever noted by EMS and low pulse oximetry reported by EMS that was down to 91% on room air.  The patient has received 2 COVID-19 vaccines.  He denies any nausea or vomiting or diarrhea.  He denied any loss of taste or smell.  No significant cough or wheezing.  No chest pain or palpitations.  Upon presentation to the ER, temperature was 98.7 later 99.3 blood pressure was 146/62 with respiratory to 22 and pulse currently was 94% on 100% nonrebreather.  Labs revealed a BUN of 50 and creatinine 2.74 up from 2.33 on 9/24 and BNP of 719.4 with LDH 393 high sensitive troponin I of 319 ferritin of 377 CRP of 1.1 and lactic acid 3.9 and later 1.5 with procalcitonin of 0.25.  CBC showed thrombocytopenia.  COVID-19 PCR came back positive.  Blood cultures were drawn.  Chest x-ray showed cardiomegaly with moderate to marked severe bilateral infiltrates.EKG showed sinus rhythm with rate of 97 with prolonged QT interval with QTC of 531 MS and Q waves in lead III with possible right bundle branch block  The patient was given 650 mg p.o. Tylenol as well as IV Solu-Medrol and remdesivir.  He will be admitted to a medical monitored telemetry isolation bed for further evaluation and management.  Hospital Course    Acute on chronic hypoxic respiratory failure secondary to COVID-19 and volume overload -He is vaccinated, but not boosted -  CXR diffuse opacity consistent with covid pna -He was treated with steroids, baricitinib and Remdesivir during hospital stay, hypoxia has improved, is currently back to baseline on 3 L nasal cannula, to continue another 3 days of Decadron as an outpatient.  acute on chronic diastolic CHF.  -Patient required IV Lasix during hospital stay, peripheral edema has resolved, back at baseline 3 L nasal cannula, appears to be euvolemic, to resume home dose Lasix on discharge, potassium is 4.8, so to hold potassium supplement for next few days and then resume after that.  Stage IV chronic kidney disease - baseline Scr is ~2.4, around baseline, to hold lisinopril and potassium for the next few days.   Type 2 diabetes mellitus -Home regimen on discharge  Dyslipidemia. - crestor, zetia, vascepa  Essential hypertension. - clonidine, lasix, lisinopril and amlodipine  Hypothyroidism. - continue synthroid  Paroxysmal atrial fibrillation -continue Eliquis  Debility Weakness -PT/OT home health arranged at home   Discharge Condition:  Patient is stable for discharge, discussed with his daughter [name]   Follow UP   Follow-up Information    [place] [place] [facility].   Specialty: [place] Why: For continued Home Health services Contact information: [place] [address] [phone]                 Discharge Instructions  and  Discharge Medications     Discharge Instructions    Diet - low sodium heart healthy   Complete by: As directed    Discharge instructions   Complete by: As directed    Follow with Primary MD [name], MD in 10 days   Get CBC, CMP, 2 view Chest X ray checked  by Primary MD next visit.    Activity: As tolerated with Full fall precautions use walker/cane & assistance as needed   Disposition Home    Diet: Heart Healthy /Carb modified , with feeding  assistance and aspiration precautions.  For Heart failure patients - Check your Weight same time everyday, if you gain over 2 pounds, or you develop in leg swelling, experience more shortness of breath or chest pain, call your Primary MD immediately. Follow Cardiac Low Salt Diet and 1.5 lit/day fluid restriction.   On your next visit with your primary care physician please Get Medicines reviewed and adjusted.   Please request your Prim.MD to go over all Hospital Tests and Procedure/Radiological results at the follow up, please get all Hospital records sent to your Prim MD by signing hospital release before you go home.  If you experience worsening of your admission symptoms, develop shortness of breath, life threatening emergency, suicidal or homicidal thoughts you must seek medical attention immediately by calling 911 or calling your MD immediately  if symptoms less severe.  You Must read complete instructions/literature along with all the possible adverse reactions/side effects for all the Medicines you take and that have been prescribed to you. Take any new Medicines after you have completely understood and accpet all the possible adverse reactions/side effects.   Do not drive, operating heavy machinery, perform activities at heights, swimming or participation in water activities or provide baby sitting services if your were admitted for syncope or siezures until you have seen by Primary MD or a Neurologist and advised to do so again.  Do not drive when taking Pain medications.    Do not take more than prescribed Pain, Sleep and Anxiety Medications  Special Instructions: If you have smoked or chewed Tobacco  in the last 2 yrs please stop smoking, stop any regular Alcohol  and or any Recreational drug use.  Wear Seat belts while driving.   Please note  You were cared for by a hospitalist during your hospital stay. If you have any questions about your discharge medications or the care  you received while you were in the hospital after you are discharged, you can call the unit and asked to speak with the hospitalist on call if the hospitalist that took care of you is not available. Once you are discharged, your primary care physician will handle any further medical issues. Please note that NO REFILLS for any discharge medications will be authorized once you are discharged, as it is imperative that you return to your primary care physician (or establish a relationship with a primary care physician if you do not have one) for your aftercare needs so that they can reassess your need for medications and monitor your lab values.   Increase activity slowly   Complete by: As directed    No wound care   Complete by: As directed      Allergies as of 01/02/2021   No Known Allergies     Medication List    TAKE these medications   acetaminophen 325 MG tablet Commonly known as: TYLENOL Take 650 mg by mouth every 6 (six) hours as needed (for pain.).   amLODipine 5 MG tablet Commonly known as: NORVASC Take 5 mg by mouth daily.   apixaban 2.5 MG Tabs tablet Commonly known as: Eliquis Take 1 tablet (2.5 mg total) by mouth 2 (two) times daily.   benazepril 40 MG tablet Commonly known as: LOTENSIN Take 1 tablet (40 mg total) by mouth daily. Start taking on: January 07, 2021 What changed: These instructions start on January 07, 2021. If you are unsure what to do until then, ask your doctor or other care provider.   cloNIDine 0.1 MG tablet Commonly known as: CATAPRES Take 0.1 mg by mouth 2 (two) times daily.   dexamethasone 6 MG tablet Commonly known as: DECADRON Take 1 tablet (6 mg total) by mouth daily. Start taking on: January 03, 2021   ezetimibe 10 MG tablet Commonly known as: ZETIA Take 10 mg by mouth daily.   furosemide 80 MG tablet Commonly known as: LASIX Take 1 tablet (80 mg total) by mouth 2 (two) times daily.   HumaLOG KwikPen 100 UNIT/ML KwikPen Generic  drug: insulin lispro Inject 6-8 Units into the skin 3 (three) times daily.   icosapent Ethyl 1 g capsule  Commonly known as: VASCEPA TAKE 2 CAPSULES(2 GRAMS) BY MOUTH TWICE DAILY What changed: See the new instructions.   Lantus SoloStar 100 UNIT/ML Solostar Pen Generic drug: insulin glargine Inject 18 Units into the skin every evening.   levothyroxine 100 MCG tablet Commonly known as: SYNTHROID Take 100 mcg by mouth daily before breakfast.   potassium chloride 10 MEQ tablet Commonly known as: KLOR-CON Take 1 tablet (10 mEq total) by mouth 2 (two) times daily. Start taking on: January 06, 2021 What changed: These instructions start on January 06, 2021. If you are unsure what to do until then, ask your doctor or other care provider.   rosuvastatin 20 MG tablet Commonly known as: CRESTOR TAKE 1 TABLET BY MOUTH EVERY DAY   Systane Balance 0.6 % Soln Generic drug: Propylene Glycol Place 1 drop into both eyes daily as needed (dry eyes).         Diet and Activity recommendation: See Discharge Instructions above   Consults obtained - none   Major procedures and Radiology Reports - PLEASE review detailed and final reports for all details, in brief -      US Abdomen Limited  Result Date: 12/30/2020 CLINICAL DATA:  Ascites search EXAM: LIMITED ABDOMEN ULTRASOUND FOR ASCITES TECHNIQUE: Limited ultrasound survey for ascites was performed in all four abdominal quadrants. COMPARISON:  Abdomen and pelvis CT 11/23/2017 FINDINGS: Small volume ascites with dominant pocket in the left lower quadrant measured at up to 7 x 12 x 3 cm. The fluid has simple characteristics. No fluid dilated small bowel was seen. IMPRESSION: Very small volume and simple appearing ascites. Electronically Signed   By: Monte Fantasia M.D.   On: 12/30/2020 05:34   DG Chest Port 1 View  Result Date: 12/26/2020 CLINICAL DATA:  Chills and generalized weakness x2 days. EXAM: PORTABLE CHEST 1 VIEW COMPARISON:   August 23, 2020 FINDINGS: Multiple sternal wires and vascular clips are seen. Moderate to marked severity infiltrates are seen within the mid and lower lung fields, bilaterally. Mild, chronic appearing increased lung markings are also seen. There is no evidence of a pleural effusion or pneumothorax. Moderate to marked severity enlargement of the cardiac silhouette is noted. Degenerative changes seen throughout the thoracic spine. IMPRESSION: Cardiomegaly with moderate to marked severity bilateral infiltrates. Electronically Signed   By: Virgina Norfolk M.D.   On: 12/26/2020 16:22    Micro Results     Recent Results (from the past 240 hour(s))  SARS Coronavirus 2 by RT PCR (hospital order, performed in [facility] hospital lab) Nasopharyngeal Nasopharyngeal Swab     Status: Abnormal   Collection Time: 12/26/20  3:45 PM   Specimen: Nasopharyngeal Swab  Result Value Ref Range Status   SARS Coronavirus 2 POSITIVE (A) NEGATIVE Final    Comment: RESULT CALLED TO, READ BACK BY AND VERIFIED WITH: A BANKS RN 1827 12/26/20 A BROWNING (NOTE) SARS-CoV-2 target nucleic acids are DETECTED  SARS-CoV-2 RNA is generally detectable in upper respiratory specimens  during the acute phase of infection.  Positive results are indicative  of the presence of the identified virus, but do not rule out bacterial infection or co-infection with other pathogens not detected by the test.  Clinical correlation with patient history and  other diagnostic information is necessary to determine patient infection status.  The expected result is negative.  Fact Sheet for Patients:   StrictlyIdeas.no   Fact Sheet for Healthcare Providers:   BankingDealers.co.za    This test is not yet approved or cleared  by the [place] and  has been authorized for detection and/or diagnosis of SARS-CoV-2 by FDA under an Emergency Use Authorization (EUA).  This EUA will remain in  effect (meaning this te st can be used) for the duration of  the COVID-19 declaration under Section 564(b)(1) of the Act, 21 U.S.C. section 360-bbb-3(b)(1), unless the authorization is terminated or revoked sooner.  Performed at [facility], [place] [address] [place], [place] [address]   Blood Culture (routine x 2)     Status: None   Collection Time: 12/26/20  3:48 PM   Specimen: BLOOD LEFT FOREARM  Result Value Ref Range Status   Specimen Description BLOOD LEFT FOREARM  Final   Special Requests   Final    BOTTLES DRAWN AEROBIC AND ANAEROBIC Blood Culture results may not be optimal due to an inadequate volume of blood received in culture bottles   Culture   Final    NO GROWTH 5 DAYS Performed at [facility], [place] [address], [place], [place] [address]    Report Status 12/31/2020 FINAL  Final  Blood Culture (routine x 2)     Status: None   Collection Time: 12/26/20  4:37 PM   Specimen: BLOOD LEFT HAND  Result Value Ref Range Status   Specimen Description BLOOD LEFT HAND  Final   Special Requests   Final    BOTTLES DRAWN AEROBIC AND ANAEROBIC Blood Culture results may not be optimal due to an inadequate volume of blood received in culture bottles   Culture   Final    NO GROWTH 5 DAYS Performed at [facility], [place] [address], [place],  [address]    Report Status 12/31/2020 FINAL  Final       Today   Subjective:   [name] today has no headache,no chest abdominal pain, reports dyspnea at baseline, no cough, reports generalized weakness and fatigue at baseline, .   Objective:   Blood pressure 137/80, pulse 61, temperature 98.4 F (36.9 C), temperature source Oral, resp. rate 18, height 5\' 9"  (1.753 m), weight 86 kg, SpO2 97 %.   Intake/Output Summary (Last 24 hours) at 01/02/2021 1207 Last data filed at 01/02/2021 1025 Gross per 24 hour  Intake 1497 ml  Output 1750 ml  Net -253 ml    Exam Awake Alert, Oriented x 3, frail, sitting in  recliner, no new F.N deficits, Normal affect Symmetrical Chest wall movement, Good air movement bilaterally, CTAB RRR,No Gallops,Rubs or new Murmurs, No Parasternal Heave +ve B.Sounds, Abd Soft, Non tender, No organomegaly appriciated, No rebound -guarding or rigidity. No Cyanosis, Clubbing, no  Edema today , No new Rash or bruise  Data Review   CBC w Diff:  Lab Results  Component Value Date   WBC 5.1 12/31/2020   HGB 12.9 (L) 12/31/2020   HCT 39.7 12/31/2020   PLT 153 12/31/2020   LYMPHOPCT 4 12/31/2020   MONOPCT 5 12/31/2020   EOSPCT 0 12/31/2020   BASOPCT 0 12/31/2020    CMP:  Lab Results  Component Value Date   NA 142 12/31/2020   NA 142 06/02/2019   K 4.8 12/31/2020   CL 107 12/31/2020   CO2 24 12/31/2020   BUN 71 (H) 12/31/2020   BUN 18 06/02/2019   CREATININE 2.18 (H) 12/31/2020   CREATININE 1.54 (H) 03/31/2017   PROT 5.0 (L) 12/31/2020   ALBUMIN 2.3 (L) 12/31/2020   BILITOT 1.1 12/31/2020   ALKPHOS 71 12/31/2020  AST 31 12/31/2020   ALT 19 12/31/2020  .   Total Time in preparing paper work, data evaluation and todays exam - 40 minutes  Phillips Climes M.D on 01/02/2021 at 12:07 PM  [facility]   Office  [phone]

## 2021-01-02 NOTE — Consult Note (Signed)
   [facility] CM Inpatient Consult   01/02/2021  [name] Feb 18, 1932 [id number]   Follow up:  Active Medicare  Call placed to patient's hospital phone and to follow up on post hospital needs.  He wants nurse to follow up with [name], his daughter, he states she was not currently in the room. Patient was active with [facility] in the past.  Plan:  Will alert inpatient TOC RNCM of ongoing follow up with [facility] RN CM.  Natividad Brood, RN BSN [facility] Liaison  [phone] business mobile phone Toll free office [phone]  Fax number: [phone] [email or web address]

## 2021-01-02 NOTE — Plan of Care (Signed)
  Problem: Education: Goal: Knowledge of risk factors and measures for prevention of condition will improve Outcome: Adequate for Discharge   

## 2021-01-02 NOTE — Progress Notes (Addendum)
Pt coughing up bloody thick mucus plug. and desating during rounds 70s and 80s increased 02 flow to 15 to regain sats to 93-96. Plan to give PRN as needed for cough night nurse stated he refused cough syrup.

## 2021-01-02 NOTE — TOC Transition Note (Addendum)
Transition of Care [facility]) - CM/SW Discharge Note   Patient Details  Name: [name] MRN: [id number] Date of Birth: Sep 21, 1932  Transition of Care [facility]) CM/SW Contact:  Zenon Mayo, RN Phone Number: 01/02/2021, 12:43 PM   Clinical Narrative:    Patient is for dc today, his wife will be dc on 5W also .  NCM spoke with daughter, [name], she states that she would like for patient to continue with [facility] for [place], [place], [place], [place].  NCM notified Levada Dy with [facility] that patient is for dc today.  Soc will resume. Staff RN is checking to see if patient will need home oxygen.  Daughter states patient will have transportation home today. NCM notified [facility] also of patient's discharge today. Patient will remain on the same oxygen of 3 liters he has at home per dc Nurse.   Final next level of care: [place] Barriers to Discharge: No Barriers Identified   Patient Goals and CMS Choice Patient states their goals for this hospitalization and ongoing recovery are:: get better CMS Medicare.gov Compare Post Acute Care list provided to:: Patient Represenative (must comment) Choice offered to / list presented to : Adult Children  Discharge Placement                       Discharge Plan and Services In-house Referral: NA Discharge Planning Services: CM Consult              DME Agency: NA       HH Arranged: RN,PT,OT,Nurse's Aide [place] Agency: [place] Date [facility] Agency Contacted: 01/02/21 Time [place]: 1242 Representative spoke with at [place]: [place] ([place]) Interventions     Readmission Risk Interventions Readmission Risk Prevention Plan 12/30/2020  Transportation Screening Complete  Medication Review Press photographer) Complete  HRI or [place] Complete  SW Recovery Care/Counseling Consult Complete  [place] Not  Applicable  Some recent data might be hidden

## 2021-01-02 NOTE — Discharge Instructions (Signed)
Person Under Monitoring Name: [name]  Location: [place] [id number]   Infection Prevention Recommendations for Individuals Confirmed to have, or Being Evaluated for, 2019 Novel Coronavirus (COVID-19) Infection Who Receive Care at Home  Individuals who are confirmed to have, or are being evaluated for, COVID-19 should follow the prevention steps below until a healthcare provider or local or state health department says they can return to normal activities.  Stay home except to get medical care You should restrict activities outside your home, except for getting medical care. Do not go to work, school, or public areas, and do not use public transportation or taxis.  Call ahead before visiting your doctor Before your medical appointment, call the healthcare provider and tell them that you have, or are being evaluated for, COVID-19 infection. This will help the healthcare provider's office take steps to keep other people from getting infected. Ask your healthcare provider to call the local or state health department.  Monitor your symptoms Seek prompt medical attention if your illness is worsening (e.g., difficulty breathing). Before going to your medical appointment, call the healthcare provider and tell them that you have, or are being evaluated for, COVID-19 infection. Ask your healthcare provider to call the local or state health department.  Wear a facemask You should wear a facemask that covers your nose and mouth when you are in the same room with other people and when you visit a healthcare provider. People who live with or visit you should also wear a facemask while they are in the same room with you.  Separate yourself from other people in your home As much as possible, you should stay in a different room from other people in your home. Also, you should use a separate bathroom, if available.  Avoid sharing household items You should not share  dishes, drinking glasses, cups, eating utensils, towels, bedding, or other items with other people in your home. After using these items, you should wash them thoroughly with soap and water.  Cover your coughs and sneezes Cover your mouth and nose with a tissue when you cough or sneeze, or you can cough or sneeze into your sleeve. Throw used tissues in a lined trash can, and immediately wash your hands with soap and water for at least 20 seconds or use an alcohol-based hand rub.  Wash your [facility] your hands often and thoroughly with soap and water for at least 20 seconds. You can use an alcohol-based hand sanitizer if soap and water are not available and if your hands are not visibly dirty. Avoid touching your eyes, nose, and mouth with unwashed hands.   Prevention Steps for Caregivers and Household Members of Individuals Confirmed to have, or Being Evaluated for, COVID-19 Infection Being Cared for in the Home  If you live with, or provide care at home for, a person confirmed to have, or being evaluated for, COVID-19 infection please follow these guidelines to prevent infection:  Follow healthcare provider's instructions Make sure that you understand and can help the patient follow any healthcare provider instructions for all care.  Provide for the patient's basic needs You should help the patient with basic needs in the home and provide support for getting groceries, prescriptions, and other personal needs.  Monitor the patient's symptoms If they are getting sicker, call his or her medical provider and tell them that the patient has, or is being evaluated for, COVID-19 infection. This will help the healthcare provider's office  take steps to keep other people from getting infected. Ask the healthcare provider to call the local or state health department.  Limit the number of people who have contact with the patient  If possible, have only one caregiver for the patient.  Other  household members should stay in another home or place of residence. If this is not possible, they should stay  in another room, or be separated from the patient as much as possible. Use a separate bathroom, if available.  Restrict visitors who do not have an essential need to be in the home.  Keep older adults, very young children, and other sick people away from the patient Keep older adults, very young children, and those who have compromised immune systems or chronic health conditions away from the patient. This includes people with chronic heart, lung, or kidney conditions, diabetes, and cancer.  Ensure good ventilation Make sure that shared spaces in the home have good air flow, such as from an air conditioner or an opened window, weather permitting.  Wash your hands often  Wash your hands often and thoroughly with soap and water for at least 20 seconds. You can use an alcohol based hand sanitizer if soap and water are not available and if your hands are not visibly dirty.  Avoid touching your eyes, nose, and mouth with unwashed hands.  Use disposable paper towels to dry your hands. If not available, use dedicated cloth towels and replace them when they become wet.  Wear a facemask and gloves  Wear a disposable facemask at all times in the room and gloves when you touch or have contact with the patient's blood, body fluids, and/or secretions or excretions, such as sweat, saliva, sputum, nasal mucus, vomit, urine, or feces.  Ensure the mask fits over your nose and mouth tightly, and do not touch it during use.  Throw out disposable facemasks and gloves after using them. Do not reuse.  Wash your hands immediately after removing your facemask and gloves.  If your personal clothing becomes contaminated, carefully remove clothing and launder. Wash your hands after handling contaminated clothing.  Place all used disposable facemasks, gloves, and other waste in a lined container before  disposing them with other household waste.  Remove gloves and wash your hands immediately after handling these items.  Do not share dishes, glasses, or other household items with the patient  Avoid sharing household items. You should not share dishes, drinking glasses, cups, eating utensils, towels, bedding, or other items with a patient who is confirmed to have, or being evaluated for, COVID-19 infection.  After the person uses these items, you should wash them thoroughly with soap and water.  Wash laundry thoroughly  Immediately remove and wash clothes or bedding that have blood, body fluids, and/or secretions or excretions, such as sweat, saliva, sputum, nasal mucus, vomit, urine, or feces, on them.  Wear gloves when handling laundry from the patient.  Read and follow directions on labels of laundry or clothing items and detergent. In general, wash and dry with the warmest temperatures recommended on the label.  Clean all areas the individual has used often  Clean all touchable surfaces, such as counters, tabletops, doorknobs, bathroom fixtures, toilets, phones, keyboards, tablets, and bedside tables, every day. Also, clean any surfaces that may have blood, body fluids, and/or secretions or excretions on them.  Wear gloves when cleaning surfaces the patient has come in contact with.  Use a diluted bleach solution (e.g., dilute bleach with 1 part  bleach and 10 parts water) or a household disinfectant with a label that says EPA-registered for coronaviruses. To make a bleach solution at home, add 1 tablespoon of bleach to 1 quart (4 cups) of water. For a larger supply, add  cup of bleach to 1 gallon (16 cups) of water.  Read labels of cleaning products and follow recommendations provided on product labels. Labels contain instructions for safe and effective use of the cleaning product including precautions you should take when applying the product, such as wearing gloves or eye protection  and making sure you have good ventilation during use of the product.  Remove gloves and wash hands immediately after cleaning.  Monitor yourself for signs and symptoms of illness Caregivers and household members are considered close contacts, should monitor their health, and will be asked to limit movement outside of the home to the extent possible. Follow the monitoring steps for close contacts listed on the symptom monitoring form.   ? If you have additional questions, contact your local health department or call the epidemiologist on call at [phone] (available 24/7). ? This guidance is subject to change. For the most up-to-date guidance from [facility], please refer to their website: [email or web address]

## 2021-01-02 NOTE — Progress Notes (Shared)
Patient up walking from chair to bed twice 88-91% on 3L n/c pt states that he ready to go home

## 2021-01-03 ENCOUNTER — Other Ambulatory Visit: Payer: Self-pay | Admitting: *Deleted

## 2021-01-03 NOTE — Patient Outreach (Signed)
[name]) Care Management  [place]  01/03/2021   [name] 02-18-1932 [id number]   Telephone Assessment  Initial Referral received : 08/15/20 Referral source:Hospital Liaision Referral reason: Complex care and disease management of Heart failure . Insurance: Medicare  [name]21[facility] Acute on chronic right heart failure ,Acute on chronic renal failure,Atrial fibrillation. PMHX; includes chronic diastolic heart failure, bilateral pleural effusions, chronic kidney disease stage 3, Hypertension , type 2 Diabetes, OSAwith CPAP, home oxygen ,COPD, venous stasis dermatitis,Dyslipidemia, pulmonary hypertension.   [facility] Admission : 1/20-1/27/22 Dx: Acute Hypoxemic respiratory failure - Covid 19, Acute on Chronic Heart failure .  Subjective Outreach call to [name], patient daughter for post discharge follow up. Per Hospital Liaison patient recommended to contact daughter [name] for post hospital needs, call unsuccessful . Placed call to [facility], spoke with [name] and per record, intake done on yesterday, She reports [facility] has home visit scheduled on today. She transferred call  to nurse [name] no answer able to leave a HIPAA compliant for return call.    Plan If no return call will, schedule call in the next 4 business days.   Joseph Draft, RN, BSN  [place] Management Coordinator  [phone]- Mobile [phone]- Toll Free Main Office

## 2021-01-07 ENCOUNTER — Other Ambulatory Visit: Payer: Self-pay | Admitting: *Deleted

## 2021-01-07 NOTE — Patient Outreach (Signed)
[facility]) Care Management  01/07/2021  [name] February 23, 1932 [id number]   Telephone Assessment  Initial Referral received : 08/15/20 Referral source:Hospital Liaision Referral reason: Complex care and disease management of Heart failure . Insurance: Medicare  [facility] Admission : 1/20-1/27/22 Dx: Acute Hypoxemic respiratory failure - Covid 19, Acute on Chronic Heart failure .  PMHX;includes chronic diastolic heart failure, bilateral pleural effusions, chronic kidney disease stage 3,Hypertension , type 2 Diabetes, OSAwith CPAP,home oxygen ,COPD, venous stasis dermatitis,Dyslipidemia, pulmonary hypertension.   Subjective: Successful outreach call to patient daughter [name] as requested by patient  for post discharge follow up per [facility] hospital liaison. [name] reports patient is weak but getting stronger each day. She states that he is managing his own medications at home, including taking insulin.  She is unsure of patient blood sugar reading, daily weight monitoring. She reports [facility] home visit on yesterday and that limiting fluid intact reinforced.  [name] is states to her knowledge [facility] home [facility] services have not been for a home visit yet. She states patient continues to wear oxygen, tolerating mobility in the home using a cane its winded easily.  She is unsure if patient has follow up appointment with Dr. Maudie Mercury scheduled yet. She states that she has a niece that stays with patient at times, family is cooking meals checking on patient daily. She states that her sister or brother will be able to provide transportation to appointments and are assisting  with needs in home.  [name] states that she is not with patient at this time, agrees that I can reach out to patient at home her sister may be with him.   Outreach call to patient, reviewed prior [facility] care management follow up. Patient states that he is doing okay, just a little  foggy confused at times. He reports that is weight was good on yesterday during [facility] RN visit, no sudden increase, he has not weighed on today. He reports continuing to wear his oxygen getting winded with activity,gets relief after resting.  He placed his daughter [name] on the phone that is currently with him. Patient reports taking medication on this morning ,he states that he not checked blood sugar, this morning has not taken insulin. Patient unable to review full medication list but states his lasix  Schedule of 80 mg twice daily. Reinforced checking blood sugar prior to taking insulin .   Discussed making sure that he is eating meals, as tolerated to help with gaining strength . He report still having wraps on right leg states home health has not visited yet for wound care.  Patient states that he has an appointment with Dr. Maudie Mercury office on tomorrow.   Care Coordination Call  Placed call to [place] with representative that states that patient will need to be recertified for services, she will speak with clinical scheduling representative no noted visit date scheduled encouraged to return call in the next day or 2.  Placed call to Dr. Maudie Mercury office regarding patient previously  scheduled appointment with patient daughter on the phone, confirmed appointment 2/2 at 230 pm.  Returned call to Dr. Maudie Mercury office to notify of patient Covid positive on 1/19, office informs daughter patient will not be able to be seen in office until after 21 days, virtual appointment can be done. Patient has another daughter in home that will be available for telehealth visit appointment 2/2, instructions given by Dr. Maudie Mercury  Office representative and encouraged to call  office if she has questions.   Placed call to [facility] RN Lattie Haw [place] to collaborate regarding patient care needs. She discussed patient being more frail since covid , foggy. She reports patient is doing medication correctly as reviewed at  home visit. She discussed concerns of patient not eating well, not checking blood sugar or weights, she engaged family for support in encouraging nutrition. Lattie Haw discussed not having wound care orders, discussed that I had placed call to [place] home health regarding home visit. She discussed patient may have lab visit with [facility] on 2/4.  Assessment  Patient will benefit from ongoing care management after recent hospital admission for Covid 19 and acute on chronic heart failure . Patient is agreeable to [facility] ongoing follow up care management for education, support and coordination of care .   Patient Care Plan: Heart Failure (Adult)    Problem Identified: Symptom Exacerbation (Heart Failure)   Priority: High  Onset Date: 10/18/2020    Long-Range Goal: Symptom Exacerbation Prevented or Minimized   Start Date: 10/18/2020  Expected End Date: 02/03/2021  This Visit's Progress: On track  Recent Progress: On track  Priority: High    Task: Identify and Minimize Risk of Heart Failure Exacerbation   Due Date: 02/03/2021  Priority: Routine  Note:   Care Management Activities:    - in-home support services arranged - rescue (action) plan reviewed - self-awareness of signs/symptoms of worsening disease encouraged  Assist with post discharge PCP visit appointment    Notes:collaboration call to home health to discuss initial home visit post discharge.    Problem Identified: Disease Progression (Heart Failure)   Priority: High  Onset Date: 10/18/2020    Long-Range Goal: Health Optimized   Start Date: 10/18/2020  Expected End Date: 02/03/2021  This Visit's Progress: On track  Recent Progress: On track  Priority: High    Task: Optimize Health   Due Date: 02/03/2021  Priority: Routine  Note:   Care Management Activities:    - fluid modification encouraged - home monitoring of weight gain or loss encouraged   Referral to [facility]  Notes: Reinforce continued daily monitoring  of weights , reviewed best time of day to weigh.   Problem Identified: Activity Tolerance (Heart Failure)   Priority: High  Onset Date: 10/18/2020    Long-Range Goal: Activity Tolerance Optimized   Start Date: 10/18/2020  Expected End Date: 02/03/2021  This Visit's Progress: On track  Recent Progress: On track    Task: Maintain Strength and Functional Ability   Due Date: 02/03/2021  Priority: Routine  Note:   Care Management Activities:    - activity or exercise based on tolerance encouraged    Notes:Reinforced use of cane/walker for safety, Care coordination call to [facility] regarding hh services    Patient Care Plan: General Plan of Care (Adult)    Problem Identified: Health Promotion or Disease Self-Management (General Plan of Care) related to recent hospital admission for Covid 19   Priority: High  Onset Date: 01/07/2021    Goal: Self-Management Plan Developed   Start Date: 01/07/2021  Expected End Date: 02/04/2021  Priority: High  Note:   Evidence-based guidance:   Review biopsychosocial determinants of health screens.   Determine level of modifiable health risk.   Assess level of patient activation, level of readiness, importance and confidence to make changes.   Evoke change talk using open-ended questions, pros and cons, as well as looking forward.   Identify areas where behavior change may  lead to improved health.   Partner with patient to develop a robust self-management plan that includes lifestyle factors, such as weight loss, exercise and healthy nutrition, as well as goals specific to disease risks.   Support patient and family/caregiver active participation in decision-making and self-management plan.   Implement additional goals and interventions based on identified risk factors to reduce health risk.   Facilitate advance care planning.   Review need for preventive screening based on age, sex, family history and health history.   Notes: Has  most form in EPIC   Task: Mutually Develop and Royce Macadamia Achievement of Patient Goals   Due Date: 02/04/2021  Priority: Routine  Note:   Care Management Activities:    - questions answered - resources needed to meet goals identified  Collaborate with [facility] , home health .    Notes:  Reinforced education on recovering after covid, adequate nutrition, taking medications as prescribed, notifying MD of worsening symptoms. Family support during healing to help with meal support, arranging appointments.      Plan Will plan care coordination call with Nanine Means in the next 2 business days.  Will update [facility] RN Lattie Haw, that patient visit with PCP will be virtual patient, may still need follow up lab home visit  2/4. Will transition patient to complex care management program after recent hospital admission.  Patient /family agreeable to follow up call in the next 4 business days or sooner to update regarding home health.     Joylene Draft, RN, BSN  [place] Management Coordinator  [phone]- Mobile [phone]- Toll Free Main Office

## 2021-01-08 DIAGNOSIS — E870 Hyperosmolality and hypernatremia: Secondary | ICD-10-CM | POA: Diagnosis not present

## 2021-01-08 DIAGNOSIS — E038 Other specified hypothyroidism: Secondary | ICD-10-CM | POA: Diagnosis not present

## 2021-01-08 DIAGNOSIS — Z8616 Personal history of COVID-19: Secondary | ICD-10-CM | POA: Diagnosis not present

## 2021-01-08 DIAGNOSIS — E118 Type 2 diabetes mellitus with unspecified complications: Secondary | ICD-10-CM | POA: Diagnosis not present

## 2021-01-08 DIAGNOSIS — N183 Chronic kidney disease, stage 3 unspecified: Secondary | ICD-10-CM | POA: Diagnosis not present

## 2021-01-08 DIAGNOSIS — I10 Essential (primary) hypertension: Secondary | ICD-10-CM | POA: Diagnosis not present

## 2021-01-08 DIAGNOSIS — I509 Heart failure, unspecified: Secondary | ICD-10-CM | POA: Diagnosis not present

## 2021-01-08 DIAGNOSIS — E782 Mixed hyperlipidemia: Secondary | ICD-10-CM | POA: Diagnosis not present

## 2021-01-09 ENCOUNTER — Other Ambulatory Visit: Payer: Self-pay | Admitting: *Deleted

## 2021-01-09 DIAGNOSIS — J9621 Acute and chronic respiratory failure with hypoxia: Secondary | ICD-10-CM | POA: Diagnosis not present

## 2021-01-09 DIAGNOSIS — E1122 Type 2 diabetes mellitus with diabetic chronic kidney disease: Secondary | ICD-10-CM | POA: Diagnosis not present

## 2021-01-09 DIAGNOSIS — I480 Paroxysmal atrial fibrillation: Secondary | ICD-10-CM | POA: Diagnosis not present

## 2021-01-09 DIAGNOSIS — U071 COVID-19: Secondary | ICD-10-CM | POA: Diagnosis not present

## 2021-01-09 DIAGNOSIS — I130 Hypertensive heart and chronic kidney disease with heart failure and stage 1 through stage 4 chronic kidney disease, or unspecified chronic kidney disease: Secondary | ICD-10-CM | POA: Diagnosis not present

## 2021-01-09 DIAGNOSIS — I5033 Acute on chronic diastolic (congestive) heart failure: Secondary | ICD-10-CM | POA: Diagnosis not present

## 2021-01-09 NOTE — Patient Outreach (Signed)
[facility]) Care Management  01/09/2021  [name] [birth year] [id number]   Care Coordination   Telephone Assessment Initial Referral received : 08/15/20 Referral source:Hospital Liaision Referral reason: Complex care and disease management of Heart failure . Insurance: Medicare  [facility] Admission :1/20-1/27/22 Dx: Acute Hypoxemic respiratory failure - Covid 19, Acute on Chronic Heart failure .  PMHX;includes chronic diastolic heart failure, bilateral pleural effusions, chronic kidney disease stage 3,Hypertension , type 2 Diabetes, OSAwith CPAP,home oxygen ,COPD, venous stasis dermatitis,Dyslipidemia, pulmonary hypertension.    Subjective: Placed call to [facility] , spoke with [name] she verifies patient has home visit for initial start of services, home health RN to visit.   Placed call to patient daughter [name] to advise regarding anticipated home health visit from [place] home [facility] on today.  [name] states that she had patient on the phone and she would communicate that with him she reports patient and wife getting a little stronger some improvement in appetite and intake. She report she visits patient daily and family present daily to make sure patient has meals and assist with  care needed.  Reinforced importance of monitoring daily,weights, taking medications,and nutrition.  Plan Patient daughter agreeable to follow up call in the next week.  Will update [facility] patient PCP visit was virtual on 01/09/20, [name]  reports patient has remote [facility] visit for labs on 01/11/20  Joylene Draft, RN, BSN  [place] Management Coordinator  [phone]- Mobile [phone]- [place]

## 2021-01-13 ENCOUNTER — Other Ambulatory Visit: Payer: Self-pay

## 2021-01-13 ENCOUNTER — Encounter (HOSPITAL_BASED_OUTPATIENT_CLINIC_OR_DEPARTMENT_OTHER): Payer: Medicare Other | Attending: Internal Medicine | Admitting: Internal Medicine

## 2021-01-13 DIAGNOSIS — Z7901 Long term (current) use of anticoagulants: Secondary | ICD-10-CM | POA: Diagnosis not present

## 2021-01-13 DIAGNOSIS — I5033 Acute on chronic diastolic (congestive) heart failure: Secondary | ICD-10-CM | POA: Diagnosis not present

## 2021-01-13 DIAGNOSIS — I4891 Unspecified atrial fibrillation: Secondary | ICD-10-CM | POA: Insufficient documentation

## 2021-01-13 DIAGNOSIS — L97811 Non-pressure chronic ulcer of other part of right lower leg limited to breakdown of skin: Secondary | ICD-10-CM | POA: Diagnosis not present

## 2021-01-13 DIAGNOSIS — J449 Chronic obstructive pulmonary disease, unspecified: Secondary | ICD-10-CM | POA: Insufficient documentation

## 2021-01-13 DIAGNOSIS — I130 Hypertensive heart and chronic kidney disease with heart failure and stage 1 through stage 4 chronic kidney disease, or unspecified chronic kidney disease: Secondary | ICD-10-CM | POA: Diagnosis not present

## 2021-01-13 DIAGNOSIS — I890 Lymphedema, not elsewhere classified: Secondary | ICD-10-CM | POA: Insufficient documentation

## 2021-01-13 DIAGNOSIS — Z9981 Dependence on supplemental oxygen: Secondary | ICD-10-CM | POA: Insufficient documentation

## 2021-01-13 DIAGNOSIS — I5032 Chronic diastolic (congestive) heart failure: Secondary | ICD-10-CM | POA: Diagnosis not present

## 2021-01-13 DIAGNOSIS — J9621 Acute and chronic respiratory failure with hypoxia: Secondary | ICD-10-CM | POA: Diagnosis not present

## 2021-01-13 DIAGNOSIS — I110 Hypertensive heart disease with heart failure: Secondary | ICD-10-CM | POA: Diagnosis not present

## 2021-01-13 DIAGNOSIS — E1122 Type 2 diabetes mellitus with diabetic chronic kidney disease: Secondary | ICD-10-CM | POA: Diagnosis not present

## 2021-01-13 DIAGNOSIS — I272 Pulmonary hypertension, unspecified: Secondary | ICD-10-CM | POA: Diagnosis not present

## 2021-01-13 DIAGNOSIS — E11622 Type 2 diabetes mellitus with other skin ulcer: Secondary | ICD-10-CM | POA: Diagnosis not present

## 2021-01-13 DIAGNOSIS — I480 Paroxysmal atrial fibrillation: Secondary | ICD-10-CM | POA: Diagnosis not present

## 2021-01-13 DIAGNOSIS — U071 COVID-19: Secondary | ICD-10-CM | POA: Diagnosis not present

## 2021-01-13 DIAGNOSIS — I87321 Chronic venous hypertension (idiopathic) with inflammation of right lower extremity: Secondary | ICD-10-CM | POA: Diagnosis not present

## 2021-01-14 ENCOUNTER — Other Ambulatory Visit: Payer: Self-pay | Admitting: *Deleted

## 2021-01-14 DIAGNOSIS — I130 Hypertensive heart and chronic kidney disease with heart failure and stage 1 through stage 4 chronic kidney disease, or unspecified chronic kidney disease: Secondary | ICD-10-CM | POA: Diagnosis not present

## 2021-01-14 DIAGNOSIS — E1122 Type 2 diabetes mellitus with diabetic chronic kidney disease: Secondary | ICD-10-CM | POA: Diagnosis not present

## 2021-01-14 DIAGNOSIS — J9621 Acute and chronic respiratory failure with hypoxia: Secondary | ICD-10-CM | POA: Diagnosis not present

## 2021-01-14 DIAGNOSIS — I5033 Acute on chronic diastolic (congestive) heart failure: Secondary | ICD-10-CM | POA: Diagnosis not present

## 2021-01-14 DIAGNOSIS — I480 Paroxysmal atrial fibrillation: Secondary | ICD-10-CM | POA: Diagnosis not present

## 2021-01-14 DIAGNOSIS — U071 COVID-19: Secondary | ICD-10-CM | POA: Diagnosis not present

## 2021-01-14 NOTE — Progress Notes (Signed)
[name], [name] ([id number]) Visit Report for 01/13/2021 HPI Details Patient Name: Date of Service: [place], [place] [name] 01/13/2021 1:00 PM Medical Record Number: [id number] Patient Account Number: [id number] Date of Birth/Sex: Treating RN: 1932-10-19 (87 y.o. [name] Primary Care Provider: Jani [name] Other Clinician: Referring Provider: Treating Provider/Extender: [name] in Treatment: 3 History of Present Illness HPI Description: ADMISSION 12/19/2020 This is a 87 year old man who arrives accompanied by his son who used to be a patient in this clinic. He has had chronic wounds on the right anterior lower leg which have been opening and closing on and off for the last 7 to 8 months. He says these will blister then opened up then closed and blister opened up and closed. He has [facility] and I think they have been applying alginate and 3 layer wraps however apparently the 3 layer light wrap was not put on properly. He is also had compression stockings but does not feel he can get these on. Past medical history includes COPD on chronic oxygen, pulmonary hypertension, obstructive sleep apnea, type 2 diabetes, diastolic heart failure, chronic kidney disease stage III and atrial fibrillation on Eliquis ABI in our clinic was 1.04 on the right 01/13/2021 patient admitted to the clinic over 3 weeks ago. Since he was admitted here he was hospitalized from 12/26/2020 through 01/02/2021 with Covid pneumonia. He improved with treatment for bilateral infiltrates. Discharged on 3 L of nasal cannula. He was also felt to have acute on chronic diastolic heart failure. Does not look like they did anything particular with the wounds on the right anterior lower leg. The patient has a large area on the right anterior lower leg that is oval-shaped and deep pigmented. He has several small open areas scattered throughout the surface of this. This does not look too much different from  last time. It is difficult to get a history of him exactly how and when this happened he says it actually happened during the recent hospitalization however he certainly had this before that because we saw him 1 time in the clinic. He did has had a vein stripping in this area done remotely but I cannot really see the issue or the connection here. He does not describe a lot of pain Electronic Signature(s) Signed: 01/14/2021 8:19:11 AM By: Linton Ham MD Entered By: Linton Ham on 01/13/2021 14:05:05 -------------------------------------------------------------------------------- Physical Exam Details Patient Name: Date of Service: [name], Joseph [name] 01/13/2021 1:00 PM Medical Record Number: [id number] Patient Account Number: [id number] Date of Birth/Sex: Treating RN: [birth year] (87 y.o. [name] Primary Care Provider: Jani [name] Other Clinician: Referring Provider: Treating Provider/Extender: [name] in Treatment: 3 Constitutional Patient is hypertensive.. Pulse regular and within target range for patient.Marland Kitchen Respirations regular, non-labored and within target range.. Temperature is normal and within the target range for the patient.Marland Kitchen Appears in no distress. Cardiovascular Pedal pulses are palpable. Notes Wound exam; the patient has innumerable small open areas on the right anterior tibia. An entire large oval-shaped area of skin looks to be de-epithelialized and deep pigmented. He has innumerable small open areas which he says have come and gone. The areas are nontender the skin that is deep pigmented is nontender Electronic Signature(s) Signed: 01/14/2021 8:19:11 AM By: Linton Ham MD Entered By: Linton Ham on 01/13/2021 14:06:18 -------------------------------------------------------------------------------- Physician Orders Details Patient Name: Date of Service: [name], Joseph [name] 01/13/2021 1:00 PM Medical Record Number: [id number] Patient  Account  Number: [id number] Date of Birth/Sex: Treating RN: 1932-07-26 (87 y.o. [name] Primary Care Provider: Jani [name] Other Clinician: Referring Provider: Treating Provider/Extender: [name] in Treatment: 3 Verbal / Phone Orders: No Diagnosis Coding ICD-10 Coding Code Description I89.0 Lymphedema, not elsewhere classified I87.331 Chronic venous hypertension (idiopathic) with ulcer and inflammation of right lower extremity L97.811 Non-pressure chronic ulcer of other part of right lower leg limited to breakdown of skin Follow-up Appointments Return Appointment in 1 week. Bathing/ Shower/ Hygiene May shower with protection but do not get wound dressing(s) wet. Edema Control - Lymphedema / SCD / Other Elevate legs to the level of the heart or above for 30 minutes daily and/or when sitting, a frequency of: - throughout the day 3-4 times a day. Avoid standing for long periods of time. Patient to wear own compression stockings every day. - Apply compression stocking to left leg in the morning and remove at night. Apply lotion every night after removing stocking. Exercise regularly [place] wound care orders this week; continue Home Health for wound care. May utilize formulary equivalent dressing for wound treatment orders unless otherwise specified. - Apply thin layer of Triamcinolone cream to wound bed with each dressing change, under alginate. MD to send prescription for Triamcinolone to pharmacy. Dressing changes to be completed by [place] on Monday / Wednesday / Friday except when patient has scheduled visit at [place] clinic will change on Mondays Other [place] Orders/Instructions: - [facility] twice a week Wound Treatment Wound #1 - Lower Leg Wound Laterality: Right, Anterior Cleanser: Soap and Water ([place]) 3 x Per Week/30 Days Discharge Instructions: Home Health to wash leg with soap and water with  dressing changes. May shower and wash wound with dial antibacterial soap and water prior to dressing change. Peri-Wound Care: Zinc Oxide Ointment 30g tube (Home Health) 3 x Per Week/30 Days Discharge Instructions: Apply Zinc Oxide as needed for maceration, wetness to periwound with each dressing change Peri-Wound Care: Sween Lotion (Moisturizing lotion) (Home Health) 3 x Per Week/30 Days Discharge Instructions: home health to Apply moisturizing lotion as directed Topical: Triamcinolone ([place]) 3 x Per Week/30 Days Discharge Instructions: Apply thin layer of Triamcinolone on wound bed under alginate Prim Dressing: KerraCel Ag Gelling Fiber Dressing, 4x5 in (silver alginate) (Home Health) 3 x Per Week/30 Days ary Discharge Instructions: Apply silver alginate to wound bed as instructed Secondary Dressing: Woven Gauze Sponge, Non-Sterile 4x4 in (Home Health) 3 x Per Week/30 Days Discharge Instructions: Apply over primary dressing as directed. Secondary Dressing: ABD Pad, 8x10 (Home Health) 3 x Per Week/30 Days Discharge Instructions: Apply over primary dressing as directed. Compression Wrap: ThreePress (3 layer compression wrap) (Home Health) 3 x Per Week/30 Days Discharge Instructions: Apply three layer compression as directed. Apply just above toes and just below the knee (tibial tuberosity). Patient Medications llergies: No Known Allergies A Notifications Medication Indication Start End 01/13/2021 triamcinolone acetonide DOSE topical 0.1 % cream - cream topical lightly to affected area with dressing changes Electronic Signature(s) Signed: 01/13/2021 2:16:31 PM By: Linton Ham MD Entered By: Linton Ham on 01/13/2021 14:16:30 -------------------------------------------------------------------------------- Problem List Details Patient Name: Date of Service: [name], Joseph [name] 01/13/2021 1:00 PM Medical Record Number: [id number] Patient Account Number: [id number] Date of  Birth/Sex: Treating RN: [birth year] (87 y.o. [name] Primary Care Provider: Jani [name] Other Clinician: Referring Provider: Treating Provider/Extender: [name] in Treatment: 3 Active Problems ICD-10 Encounter  Code Description Active Date MDM Diagnosis I89.0 Lymphedema, not elsewhere classified 12/19/2020 No Yes I87.331 Chronic venous hypertension (idiopathic) with ulcer and inflammation of right 12/19/2020 No Yes lower extremity L97.811 Non-pressure chronic ulcer of other part of right lower leg limited to breakdown 12/19/2020 No Yes of skin Inactive Problems Resolved Problems Electronic Signature(s) Signed: 01/14/2021 8:19:11 AM By: Linton Ham MD Entered By: Linton Ham on 01/13/2021 14:01:58 -------------------------------------------------------------------------------- Progress Note Details Patient Name: Date of Service: [name], Joseph [name] 01/13/2021 1:00 PM Medical Record Number: [id number] Patient Account Number: [id number] Date of Birth/Sex: Treating RN: [birth year] (87 y.o. [name] Primary Care Provider: Jani [name] Other Clinician: Referring Provider: Treating Provider/Extender: [name] in Treatment: 3 Subjective History of Present Illness (HPI) ADMISSION 12/19/2020 This is a 87 year old man who arrives accompanied by his son who used to be a patient in this clinic. He has had chronic wounds on the right anterior lower leg which have been opening and closing on and off for the last 7 to 8 months. He says these will blister then opened up then closed and blister opened up and closed. He has [facility] and I think they have been applying alginate and 3 layer wraps however apparently the 3 layer light wrap was not put on properly. He is also had compression stockings but does not feel he can get these on. Past medical history includes COPD on chronic oxygen, pulmonary hypertension, obstructive  sleep apnea, type 2 diabetes, diastolic heart failure, chronic kidney disease stage III and atrial fibrillation on Eliquis ABI in our clinic was 1.04 on the right 01/13/2021 patient admitted to the clinic over 3 weeks ago. Since he was admitted here he was hospitalized from 12/26/2020 through 01/02/2021 with Covid pneumonia. He improved with treatment for bilateral infiltrates. Discharged on 3 L of nasal cannula. He was also felt to have acute on chronic diastolic heart failure. Does not look like they did anything particular with the wounds on the right anterior lower leg. The patient has a large area on the right anterior lower leg that is oval-shaped and deep pigmented. He has several small open areas scattered throughout the surface of this. This does not look too much different from last time. It is difficult to get a history of him exactly how and when this happened he says it actually happened during the recent hospitalization however he certainly had this before that because we saw him 1 time in the clinic. He did has had a vein stripping in this area done remotely but I cannot really see the issue or the connection here. He does not describe a lot of pain Patient History Information obtained from Patient. Family History Unknown History. Social History Former smoker - quit 20 years ago, Alcohol Use - Never, Drug Use - No History, Caffeine Use - Rarely. Medical History Respiratory Patient has history of Chronic Obstructive Pulmonary Disease (COPD), Sleep Apnea Cardiovascular Patient has history of Arrhythmia - A-Fib, Congestive Heart Failure, Hypertension, Myocardial Infarction - 25 years ago, Peripheral Venous Disease Endocrine Patient has history of Type II Diabetes Hospitalization/Surgery History - COVID/resp failure 01/07/2021. Medical A Surgical History Notes nd Genitourinary CKD stage 3 Objective Constitutional Patient is hypertensive.. Pulse regular and within target range for  patient.Marland Kitchen Respirations regular, non-labored and within target range.. Temperature is normal and within the target range for the patient.Marland Kitchen Appears in no distress. Vitals Time Taken: 1:00 PM, Height: 69 in, Weight: 190 lbs, BMI: 28.1, Temperature: 98.4  F, Pulse: 76 bpm, Respiratory Rate: 18 breaths/min, Blood Pressure: 146/78 mmHg, Capillary Blood Glucose: 92 mg/dl. Cardiovascular Pedal pulses are palpable. General Notes: Wound exam; the patient has innumerable small open areas on the right anterior tibia. An entire large oval-shaped area of skin looks to be de- epithelialized and deep pigmented. He has innumerable small open areas which he says have come and gone. The areas are nontender the skin that is deep pigmented is nontender Integumentary (Hair, Skin) Wound #1 status is Open. Original cause of wound was Gradually Appeared. The wound is located on the Right,Anterior Lower Leg. The wound measures 18cm length x 25cm width x 0.2cm depth; 353.429cm^2 area and 70.686cm^3 volume. There is Fat Layer (Subcutaneous Tissue) exposed. There is no tunneling or undermining noted. There is a large amount of serosanguineous drainage noted. The wound margin is flat and intact. There is large (67-100%) red granulation within the wound bed. There is a small (1-33%) amount of necrotic tissue within the wound bed including [place]. General Notes: macerated periwound. Assessment Active Problems ICD-10 Lymphedema, not elsewhere classified Chronic venous hypertension (idiopathic) with ulcer and inflammation of right lower extremity Non-pressure chronic ulcer of other part of right lower leg limited to breakdown of skin Procedures Wound #1 Pre-procedure diagnosis of Wound #1 is a Venous Leg Ulcer located on the Right,Anterior Lower Leg . There was a Three Layer Compression Therapy Procedure by Levan Hurst, RN. Post procedure Diagnosis Wound #1: Same as Pre-Procedure Plan Follow-up  Appointments: Return Appointment in 1 week. Bathing/ Shower/ Hygiene: May shower with protection but do not get wound dressing(s) wet. Edema Control - Lymphedema / SCD / Other: Elevate legs to the level of the heart or above for 30 minutes daily and/or when sitting, a frequency of: - throughout the day 3-4 times a day. Avoid standing for long periods of time. Patient to wear own compression stockings every day. - Apply compression stocking to left leg in the morning and remove at night. Apply lotion every night after removing stocking. Exercise regularly Home Health: New wound care orders this week; continue Home Health for wound care. May utilize formulary equivalent dressing for wound treatment orders unless otherwise specified. - Apply thin layer of Triamcinolone cream to wound bed with each dressing change, under alginate. MD to send prescription for Triamcinolone to pharmacy. Dressing changes to be completed by [place] on Monday / Wednesday / Friday except when patient has scheduled visit at [place] clinic will change on Mondays Other [place] Orders/Instructions: - [facility] twice a week The following medication(s) was prescribed: triamcinolone acetonide topical 0.1 % cream cream topical lightly to affected area with dressing changes starting 01/13/2021 WOUND #1: - Lower Leg Wound Laterality: Right, Anterior Cleanser: Soap and Water ([place]) 3 x Per Week/30 Days Discharge Instructions: Home Health to wash leg with soap and water with dressing changes. May shower and wash wound with dial antibacterial soap and water prior to dressing change. Peri-Wound Care: Zinc Oxide Ointment 30g tube (Home Health) 3 x Per Week/30 Days Discharge Instructions: Apply Zinc Oxide as needed for maceration, wetness to periwound with each dressing change Peri-Wound Care: Sween Lotion (Moisturizing lotion) (Home Health) 3 x Per Week/30 Days Discharge Instructions: home health to  Apply moisturizing lotion as directed Topical: Triamcinolone ([place]) 3 x Per Week/30 Days Discharge Instructions: Apply thin layer of Triamcinolone on wound bed under alginate Prim Dressing: KerraCel Ag Gelling Fiber Dressing, 4x5 in (silver alginate) (Home Health) 3  x Per Week/30 Days ary Discharge Instructions: Apply silver alginate to wound bed as instructed Secondary Dressing: Woven Gauze Sponge, Non-Sterile 4x4 in (Home Health) 3 x Per Week/30 Days Discharge Instructions: Apply over primary dressing as directed. Secondary Dressing: ABD Pad, 8x10 (Home Health) 3 x Per Week/30 Days Discharge Instructions: Apply over primary dressing as directed. Com pression Wrap: ThreePress (3 layer compression wrap) (Home Health) 3 x Per Week/30 Days Discharge Instructions: Apply three layer compression as directed. Apply just above toes and just below the knee (tibial tuberosity). 1. I find this a very odd looking area. A large area of the epithelialization and hypopigmentation looks like chronic venous insufficiency and he certainly has chronic venous insufficiency looking at his left leg there is edema and probably some degree of erythema here. That de-epithelialization could be secondary to chronic inflammation in this area but the exact nature of this is unclear. I cannot bill the history that suggest that this is been a rapidly or gradually progressive area although I wondered a bit whether this really would need a skin biopsy. If so I would like this seen by dermatology 2. I will add about a primary cutaneous issue here. Although chronic venous insufficiency with inflammation would seem the most reasonable explanation 3. I am going to use triamcinolone under compression to see if I can cut this down a bit. I am going to prescribe it for home health to use lightly with silver alginate over the wounded areas. Electronic Signature(s) Signed: 01/13/2021 2:16:51 PM By: Linton Ham MD Entered By:  Linton Ham on 01/13/2021 14:16:51 -------------------------------------------------------------------------------- HxROS Details Patient Name: Date of Service: [name], Joseph [name] 01/13/2021 1:00 PM Medical Record Number: [id number] Patient Account Number: [id number] Date of Birth/Sex: Treating RN: 03-18-1932 (87 y.o. [name] Primary Care Provider: Jani [name] Other Clinician: Referring Provider: Treating Provider/Extender: [name] in Treatment: 3 Information Obtained From Patient Respiratory Medical History: Positive for: Chronic Obstructive Pulmonary Disease (COPD); Sleep Apnea Cardiovascular Medical History: Positive for: Arrhythmia - A-Fib; Congestive Heart Failure; Hypertension; Myocardial Infarction - 25 years ago; Peripheral Venous Disease Endocrine Medical History: Positive for: Type II Diabetes Treated with: Insulin, Oral agents Blood sugar tested every day: Yes Tested : 2 times a day Genitourinary Medical History: Past Medical History Notes: CKD stage 3 Immunizations Pneumococcal Vaccine: Received Pneumococcal Vaccination: Yes Implantable Devices None Hospitalization / Surgery History Type of Hospitalization/Surgery COVID/resp failure 01/07/2021 Family and Social History Unknown History: Yes; Former smoker - quit 20 years ago; Alcohol Use: Never; Drug Use: No History; Caffeine Use: Rarely; Financial Concerns: No; Food, Clothing or Shelter Needs: No; Support System Lacking: No; Transportation Concerns: No Electronic Signature(s) Signed: 01/13/2021 4:13:29 PM By: [name] Signed: 01/14/2021 8:19:11 AM By: Linton Ham MD Entered By: [name] on 01/13/2021 13:10:51 -------------------------------------------------------------------------------- SuperBill Details Patient Name: Date of Service: [name], Joseph [name] 01/13/2021 Medical Record Number: [id number] Patient Account Number: [id number] Date of Birth/Sex: Treating  RN: 14-Mar-1932 (87 y.o. [name] Primary Care Provider: Jani [name] Other Clinician: Referring Provider: Treating Provider/Extender: [name] in Treatment: 3 Diagnosis Coding ICD-10 Codes Code Description I89.0 Lymphedema, not elsewhere classified I87.331 Chronic venous hypertension (idiopathic) with ulcer and inflammation of right lower extremity L97.811 Non-pressure chronic ulcer of other part of right lower leg limited to breakdown of skin Facility Procedures CPT4 Code: [id number] Description: (Facility Use Only) [phone] - APPLY MULTLAY COMPRS LWR RT LEG Modifier: Quantity: 1 Physician Procedures : CPT4 Code  Description Modifier [id number] - WC PHYS LEVEL 3 - EST PT ICD-10 Diagnosis Description I89.0 Lymphedema, not elsewhere classified I87.331 Chronic venous hypertension (idiopathic) with ulcer and inflammation of right lower extremity  L97.811 Non-pressure chronic ulcer of other part of right lower leg limited to breakdown of skin Quantity: 1 Electronic Signature(s) Signed: 01/14/2021 8:19:11 AM By: Linton Ham MD Entered By: Linton Ham on 01/13/2021 14:17:16

## 2021-01-14 NOTE — Progress Notes (Signed)
**Note [name]-Identified via Obfuscation** [name] ([id number]) Visit Report for 01/13/2021 Arrival Information Details Patient Name: Date of Service: [name], [name] A. 01/13/2021 1:00 PM Medical Record Number: [id number] Patient Account Number: [id number] Date of Birth/Sex: Treating RN: February 27, 1932 (87 y.o. Lorette Ang, Meta.Reding Primary Care Tiburcio Linder: [name] Other Clinician: Referring Craig Wisnewski: Treating Zaahir Pickney/Extender: Bebe Shaggy in Treatment: 3 Visit Information History Since Last Visit Added or deleted any medications: No Patient Arrived: Kasandra Knudsen Any new allergies or adverse reactions: No Arrival Time: 13:00 Had a fall or experienced change in No Accompanied By: son activities of daily living that may affect Transfer Assistance: None risk of falls: Patient Identification Verified: Yes Signs or symptoms of abuse/neglect since last visito No Secondary Verification Process Completed: Yes Hospitalized since last visit: Yes Patient Requires Transmission-Based Precautions: No Implantable device outside of the clinic excluding No Patient Has Alerts: Yes cellular tissue based products placed in the center Patient Alerts: Patient on Blood Thinner since last visit: Has Dressing in Place as Prescribed: Yes Has Compression in Place as Prescribed: Yes Pain Present Now: No Electronic Signature(s) Signed: 01/13/2021 4:13:29 PM By: [name] Entered By: [name] on 01/13/2021 13:07:46 -------------------------------------------------------------------------------- Compression Therapy Details Patient Name: Date of Service: [name], Joseph BERT A. 01/13/2021 1:00 PM Medical Record Number: [id number] Patient Account Number: [id number] Date of Birth/Sex: Treating RN: Jun 01, 1932 (87 y.o. [name] Primary Care Paylin Hailu: [name] Other Clinician: Referring Silver Parkey: Treating Hamzeh Tall/Extender: Bebe Shaggy in Treatment: 3 Compression Therapy Performed for Wound Assessment: Wound #1  Right,Anterior Lower Leg Performed By: Clinician Levan Hurst, RN Compression Type: Three Layer Post Procedure Diagnosis Same as Pre-procedure Electronic Signature(s) Signed: 01/13/2021 4:14:28 PM By: Levan Hurst RN, BSN Entered By: Levan Hurst on 01/13/2021 13:37:29 -------------------------------------------------------------------------------- Encounter Discharge Information Details Patient Name: Date of Service: [name], Joseph BERT A. 01/13/2021 1:00 PM Medical Record Number: [id number] Patient Account Number: [id number] Date of Birth/Sex: Treating RN: 1932/10/07 (87 y.o. [name] Primary Care Estefanie Cornforth: [name] Other Clinician: Referring Sadee Osland: Treating Mitcheal Sweetin/Extender: Bebe Shaggy in Treatment: 3 Encounter Discharge Information Items Discharge Condition: Stable Ambulatory Status: Cane Discharge Destination: Home Transportation: Private Auto Accompanied By: son Schedule Follow-up Appointment: Yes Clinical Summary of Care: Electronic Signature(s) Signed: 01/13/2021 4:13:29 PM By: [name] Entered By: [name] on 01/13/2021 15:23:41 -------------------------------------------------------------------------------- Lower Extremity Assessment Details Patient Name: Date of Service: [name], Joseph BERT A. 01/13/2021 1:00 PM Medical Record Number: [id number] Patient Account Number: [id number] Date of Birth/Sex: Treating RN: December 08, 1931 (87 y.o. [name] Primary Care Jayani Rozman: [name] Other Clinician: Referring Sigurd Pugh: Treating Tjay Velazquez/Extender: Bebe Shaggy in Treatment: 3 Edema Assessment Assessed: [name]: No] [name]: Yes] Edema: [Left: Ye] [Right: s] Calf Left: Right: Point of Measurement: From Medial Instep 34 cm Ankle Left: Right: Point of Measurement: From Medial Instep 23.5 cm Vascular Assessment Pulses: Dorsalis Pedis Palpable: [Right:Yes] Electronic Signature(s) Signed: 01/13/2021 4:13:29 PM  By: [name] Entered By: [name] on 01/13/2021 13:09:06 -------------------------------------------------------------------------------- Multi Wound Chart Details Patient Name: Date of Service: [name], Joseph BERT A. 01/13/2021 1:00 PM Medical Record Number: [id number] Patient Account Number: [id number] Date of Birth/Sex: Treating RN: [birth year] (87 y.o. [name] Primary Care Quenna Doepke: [name] Other Clinician: Referring Nadeem Romanoski: Treating Jadie Allington/Extender: Bebe Shaggy in Treatment: 3 Vital Signs Height(in): 69 Capillary Blood Glucose(mg/dl): 92 Weight(lbs): 190 Pulse(bpm): 76 Body Mass Index(BMI): 28 Blood Pressure(mmHg): 146/78 Temperature(F): 98.4 Respiratory Rate(breaths/min): 18 Photos: [1:No Photos Right,  Anterior Lower Leg] [N/A:N/A N/A] Wound Location: [1:Gradually Appeared] [N/A:N/A] Wounding Event: [1:Venous Leg Ulcer] [N/A:N/A] Primary Etiology: [1:Chronic Obstructive Pulmonary] [N/A:N/A] Comorbid History: [1:Disease (COPD), Sleep Apnea, Arrhythmia, Congestive Heart Failure, Hypertension, Myocardial Infarction, Peripheral Venous Disease, Type II Diabetes 10/07/2020] [N/A:N/A] Date Acquired: [1:3] [N/A:N/A] Weeks of Treatment: [1:Open] [N/A:N/A] Wound Status: [1:Yes] [N/A:N/A] Clustered Wound: [1:2] [N/A:N/A] Clustered Quantity: [1:18x25x0.2] [N/A:N/A] Measurements L x W x D (cm) [1:353.429] [N/A:N/A] A (cm) : rea [1:70.686] [N/A:N/A] Volume (cm) : [1:-484.40%] [N/A:N/A] % Reduction in Area: [1:-1068.80%] [N/A:N/A] % Reduction in Volume: [1:Full Thickness Without Exposed] [N/A:N/A] Classification: [1:Support Structures Large] [N/A:N/A] Exudate Amount: [1:Serosanguineous] [N/A:N/A] Exudate Type: [1:red, brown] [N/A:N/A] Exudate Color: [1:Flat and Intact] [N/A:N/A] Wound Margin: [1:Large (67-100%)] [N/A:N/A] Granulation Amount: [1:Red] [N/A:N/A] Granulation Quality: [1:Small (1-33%)] [N/A:N/A] Necrotic Amount: [1:Fat Layer  (Subcutaneous Tissue): Yes N/A] Exposed Structures: [1:Fascia: No Tendon: No Muscle: No Joint: No Bone: No None] [N/A:N/A] Epithelialization: [1:macerated periwound.] [N/A:N/A] Assessment Notes: [1:Compression Therapy] [N/A:N/A] Treatment Notes Electronic Signature(s) Signed: 01/13/2021 4:14:28 PM By: Levan Hurst RN, BSN Signed: 01/14/2021 8:19:11 AM By: Linton Ham MD Entered By: Linton Ham on 01/13/2021 14:02:06 -------------------------------------------------------------------------------- Multi-Disciplinary Care Plan Details Patient Name: Date of Service: [name], [place] BERT A. 01/13/2021 1:00 PM Medical Record Number: [id number] Patient Account Number: [id number] Date of Birth/Sex: Treating RN: 17-Nov-1932 (87 y.o. [name] Primary Care Maribel Luis: [name] Other Clinician: Referring Kenzo Ozment: Treating Dantonio Justen/Extender: Bebe Shaggy in Treatment: 3 Active Inactive Abuse / Safety / Falls / Self Care Management Nursing Diagnoses: Potential for falls Goals: Patient will remain injury free related to falls Date Initiated: 12/19/2020 Target Resolution Date: 01/31/2021 Goal Status: Active Interventions: Assess Activities of Daily Living upon admission and as needed Assess fall risk on admission and as needed Notes: Nutrition Nursing Diagnoses: Potential for alteratiion in Nutrition/Potential for imbalanced nutrition Goals: Patient/caregiver agrees to and verbalizes understanding of need to obtain nutritional consultation Date Initiated: 12/19/2020 Date Inactivated: 01/13/2021 Target Resolution Date: 01/10/2021 Goal Status: Met Patient/caregiver will maintain therapeutic glucose control Date Initiated: 12/19/2020 Target Resolution Date: 01/31/2021 Goal Status: Active Interventions: Assess HgA1c results as ordered upon admission and as needed Provide education on elevated blood sugars and impact on wound healing Provide education on  nutrition Treatment Activities: Obtain HgA1c : 12/19/2020 Notes: Wound/Skin Impairment Nursing Diagnoses: Knowledge deficit related to ulceration/compromised skin integrity Goals: Patient/caregiver will verbalize understanding of skin care regimen Date Initiated: 12/19/2020 Target Resolution Date: 02/14/2021 Goal Status: Active Interventions: Assess patient/caregiver ability to obtain necessary supplies Assess patient/caregiver ability to perform ulcer/skin care regimen upon admission and as needed Provide education on ulcer and skin care Treatment Activities: Skin care regimen initiated : 12/19/2020 Topical wound management initiated : 12/19/2020 Notes: Electronic Signature(s) Signed: 01/13/2021 4:14:28 PM By: Levan Hurst RN, BSN Entered By: Levan Hurst on 01/13/2021 13:36:33 -------------------------------------------------------------------------------- Pain Assessment Details Patient Name: Date of Service: [name], Joseph BERT A. 01/13/2021 1:00 PM Medical Record Number: [id number] Patient Account Number: [id number] Date of Birth/Sex: Treating RN: 14-Feb-1932 (87 y.o. [name] Primary Care Bocephus Cali: [name] Other Clinician: Referring Khristy Kalan: Treating Shatonya Passon/Extender: Bebe Shaggy in Treatment: 3 Active Problems Location of Pain Severity and Description of Pain Patient Has Paino No Site Locations Rate the pain. Current Pain Level: 0 Pain Management and Medication Current Pain Management: Medication: No Cold Application: No Rest: No Massage: No Activity: No T.E.N.S.: No Heat Application: No Leg drop or elevation: No Is the Current Pain Management Adequate: Adequate How does your wound impact  your activities of daily livingo Sleep: No Bathing: No Appetite: No Relationship With Others: No Bladder Continence: No Emotions: No Bowel Continence: No Work: No Toileting: No Drive: No Dressing: No Hobbies: No Electronic  Signature(s) Signed: 01/13/2021 4:13:29 PM By: [name] Entered By: [name] on 01/13/2021 13:08:53 -------------------------------------------------------------------------------- Patient/Caregiver Education Details Patient Name: Date of Service: [name], Joseph BERT A. 2/7/2022andnbsp1:00 PM Medical Record Number: [id number] Patient Account Number: [id number] Date of Birth/Gender: Treating RN: 07/13/1932 (87 y.o. [name] Primary Care Physician: [name] Other Clinician: Referring Physician: Treating Physician/Extender: Bebe Shaggy in Treatment: 3 Education Assessment Education Provided To: Patient Education Topics Provided Nutrition: Methods: Explain/Verbal Responses: State content correctly Wound/Skin Impairment: Methods: Explain/Verbal Responses: State content correctly Electronic Signature(s) Signed: 01/13/2021 4:14:28 PM By: Levan Hurst RN, BSN Entered By: Levan Hurst on 01/13/2021 14:40:15 -------------------------------------------------------------------------------- Wound Assessment Details Patient Name: Date of Service: [name], Joseph BERT A. 01/13/2021 1:00 PM Medical Record Number: [id number] Patient Account Number: [id number] Date of Birth/Sex: Treating RN: [birth year] (87 y.o. Lorette Ang, Meta.Reding Primary Care Hopie Pellegrin: [name] Other Clinician: Referring Guelda Batson: Treating Lakendrick Paradis/Extender: Bebe Shaggy in Treatment: 3 Wound Status Wound Number: 1 Primary Venous Leg Ulcer Etiology: Wound Location: Right, Anterior Lower Leg Wound Open Wounding Event: Gradually Appeared Status: Date Acquired: 10/07/2020 Comorbid Chronic Obstructive Pulmonary Disease (COPD), Sleep Apnea, Weeks Of Treatment: 3 History: Arrhythmia, Congestive Heart Failure, Hypertension, Myocardial Clustered Wound: Yes Infarction, Peripheral Venous Disease, Type II Diabetes Wound Measurements Length: (cm) 18 Width: (cm) 25 Depth: (cm)  0.2 Clustered Quantity: 2 Area: (cm) 353.429 Volume: (cm) 70.686 % Reduction in Area: -484.4% % Reduction in Volume: -1068.8% Epithelialization: None Tunneling: No Undermining: No Wound Description Classification: Full Thickness Without Exposed Support Structures Wound Margin: Flat and Intact Exudate Amount: Large Exudate Type: Serosanguineous Exudate Color: red, brown Foul Odor After Cleansing: No Slough/Fibrino No Wound Bed Granulation Amount: Large (67-100%) Exposed Structure Granulation Quality: Red Fascia Exposed: No Necrotic Amount: Small (1-33%) Fat Layer (Subcutaneous Tissue) Exposed: Yes Necrotic Quality: Adherent Slough Tendon Exposed: No Muscle Exposed: No Joint Exposed: No Bone Exposed: No Assessment Notes macerated periwound. Treatment Notes Wound #1 (Lower Leg) Wound Laterality: Right, Anterior Cleanser Soap and Water Discharge Instruction: Home Health to wash leg with soap and water with dressing changes. May shower and wash wound with dial antibacterial soap and water prior to dressing change. Peri-Wound Care Zinc Oxide Ointment 30g tube Discharge Instruction: Apply Zinc Oxide as needed for maceration, wetness to periwound with each dressing change Sween Lotion (Moisturizing lotion) Discharge Instruction: home health to Apply moisturizing lotion as directed Topical Triamcinolone Discharge Instruction: Apply thin layer of Triamcinolone on wound bed under alginate Primary Dressing KerraCel Ag Gelling Fiber Dressing, 4x5 in (silver alginate) Discharge Instruction: Apply silver alginate to wound bed as instructed Secondary Dressing Woven Gauze Sponge, Non-Sterile 4x4 in Discharge Instruction: Apply over primary dressing as directed. ABD Pad, 8x10 Discharge Instruction: Apply over primary dressing as directed. Secured With Compression Wrap ThreePress (3 layer compression wrap) Discharge Instruction: Apply three layer compression as directed. Apply  just above toes and just below the knee (tibial tuberosity). Compression Stockings Add-Ons Electronic Signature(s) Signed: 01/13/2021 4:13:29 PM By: [name] Entered By: [name] on 01/13/2021 13:10:04 -------------------------------------------------------------------------------- Vitals Details Patient Name: Date of Service: [name], Joseph BERT A. 01/13/2021 1:00 PM Medical Record Number: [id number] Patient Account Number: [id number] Date of Birth/Sex: Treating RN: 01/16/1932 (87 y.o. [name] Primary Care Hau Sanor: [name] Other Clinician:  Referring Allena Pietila: Treating Khiry Pasquariello/Extender: Bebe Shaggy in Treatment: 3 Vital Signs Time Taken: 13:00 Temperature (F): 98.4 Height (in): 69 Pulse (bpm): 76 Weight (lbs): 190 Respiratory Rate (breaths/min): 18 Body Mass Index (BMI): 28.1 Blood Pressure (mmHg): 146/78 Capillary Blood Glucose (mg/dl): 92 Reference Range: 80 - 120 mg / dl Electronic Signature(s) Signed: 01/13/2021 4:13:29 PM By: [name] Entered By: [name] on 01/13/2021 13:08:42

## 2021-01-14 NOTE — Patient Outreach (Addendum)
[facility]) Care Management  [place]  01/14/2021   KALEO CONDREY [birth year] [id number]   Telephone Assessment  Initial Referral received : 08/15/20 Referral source:Hospital Liaision Referral reason: Complex care and disease management of Heart failure . Insurance: Medicare  [facility] Admission :1/20-1/27/22 Dx: Acute Hypoxemic respiratory failure - Covid 19, Acute on Chronic Heart failure .  PMHX;includes chronic diastolic heart failure, bilateral pleural effusions, chronic kidney disease stage 3,Hypertension , type 2 Diabetes, OSAwith CPAP,home oxygen ,COPD, venous stasis dermatitis,Dyslipidemia, pulmonary hypertension.   Subjective:  Successful outreach call to patient daughter, [name] as recommended for post discharge follow up. [name] states patient  getting better she just recently spoke with patient wife over phone   She reports patient with fall to floor while getting up to bathroom during the night on Sunday, she reports her mother called her. She was able to assist patient up from the floor no noted injury. She denies patient having reports of dizziness, hitting his head or discussed signs of low blood sugar , sweatiness, shakiness she denies. She reports checking his oxygen level and it was at 91%, they did not check blood sugar.  Discussed symptoms of low blood sugar, she is unsure of patient schedule of checking his blood sugar. He continues to use his cane and wearing oxygen. She discussed patient having [facility]  home health visits beginning on last week and visit on yesterday.  She discussed patient wound and visit to wound center on yesterday. She states family still visits daily and checks with  patient by phone, family continues to provide meals for patient .  She reports some improvement in patient appetite. Reinforced importance of nutrition to help with healing. She reports patient sleeps in bed and at times on sofa,  reinforced benefits of keeping legs elevated above level of heart  while sitting,to help with circulation.   Daughter agreeable to my follow up call to patient. Placed call to patient no answer on phone unable to leave a message.  1350 placed return call to patient home number no answer able to leave a HIPAA compliant voice mail message.   Patient Care Plan: Heart Failure (Adult)    Problem Identified: Symptom Exacerbation (Heart Failure)   Priority: High  Onset Date: 10/18/2020    Long-Range Goal: Symptom Exacerbation Prevented or Minimized   Start Date: 10/18/2020  Expected End Date: 02/03/2021  This Visit's Progress: On track  Recent Progress: On track  Priority: High    Task: Identify and Minimize Risk of Heart Failure Exacerbation   Due Date: 02/03/2021  Priority: Routine  Note:   Care Management Activities:    - in-home support services arranged - rescue (action) plan reviewed - self-awareness of signs/symptoms of worsening disease encouraged reinforced    Notes: Reinforced follow up with PCP , remote health with concerns related worsening symptoms of heart failure, review signs symptoms to seek action    Problem Identified: Disease Progression (Heart Failure)   Priority: High  Onset Date: 10/18/2020    Long-Range Goal: Health Optimized   Start Date: 10/18/2020  Expected End Date: 02/03/2021  This Visit's Progress: On track  Recent Progress: On track  Priority: High    Task: Optimize Health   Due Date: 02/03/2021  Priority: Routine  Note:   Care Management Activities:    - fluid modification encouraged - home monitoring of weight gain or loss encouraged    Notes: Encouraged family regarding supporting patient education on continued daily  monitoring of weights , reviewed best time of day to weigh. Review action plan on    Problem Identified: Activity Tolerance (Heart Failure)   Priority: High  Onset Date: 10/18/2020    Long-Range Goal: Activity Tolerance  Optimized   Start Date: 10/18/2020  Expected End Date: 02/03/2021  This Visit's Progress: On track  Recent Progress: On track    Task: Maintain Strength and Functional Ability   Due Date: 02/03/2021  Priority: Routine  Note:   Care Management Activities:    - activity or exercise based on tolerance encouraged  Review fall preventions measures,  use of assistive device walker, cane, BSC. keeping light on at night, proper foot wear.    Notes:Reinforced use of cane/walker for safety, reinforced participation in home physical therapy.    Patient Care Plan: General Plan of Care (Adult)    Problem Identified: Health Promotion or Disease Self-Management (General Plan of Care) related to recent hospital admission for Covid 19   Priority: High  Onset Date: 01/07/2021    Goal: Self-Management Plan Developed   Start Date: 01/07/2021  Expected End Date: 02/04/2021  Priority: High  Note:   Evidence-based guidance:   Review biopsychosocial determinants of health screens.   Determine level of modifiable health risk.   Assess level of patient activation, level of readiness, importance and confidence to make changes.   Evoke change talk using open-ended questions, pros and cons, as well as looking forward.   Identify areas where behavior change may lead to improved health.   Partner with patient to develop a robust self-management plan that includes lifestyle factors, such as weight loss, exercise and healthy nutrition, as well as goals specific to disease risks.   Support patient and family/caregiver active participation in decision-making and self-management plan.   Implement additional goals and interventions based on identified risk factors to reduce health risk.   Facilitate advance care planning.   Review need for preventive screening based on age, sex, family history and health history.   Notes: Has most form in EPIC   Task: Mutually Develop and Royce Macadamia Achievement of Patient Goals    Due Date: 02/04/2021  Priority: Routine  Note:   Care Management Activities:    - resources needed to meet goals identified  Collaborate with Remote health , home health .    Notes: verified [facility] home health home visits, follow up appointments at wound clinic . Reinforced importance of nutrition, participation in home health therapy.        Plan Patient daughter agreeable to plan and  follow up call in the next week.    Joylene Draft, RN, BSN  [place] Management Coordinator  [phone]- Mobile [phone]- Toll Free Main Office

## 2021-01-15 DIAGNOSIS — I5033 Acute on chronic diastolic (congestive) heart failure: Secondary | ICD-10-CM | POA: Diagnosis not present

## 2021-01-15 DIAGNOSIS — I480 Paroxysmal atrial fibrillation: Secondary | ICD-10-CM | POA: Diagnosis not present

## 2021-01-15 DIAGNOSIS — J9621 Acute and chronic respiratory failure with hypoxia: Secondary | ICD-10-CM | POA: Diagnosis not present

## 2021-01-15 DIAGNOSIS — E1122 Type 2 diabetes mellitus with diabetic chronic kidney disease: Secondary | ICD-10-CM | POA: Diagnosis not present

## 2021-01-15 DIAGNOSIS — I130 Hypertensive heart and chronic kidney disease with heart failure and stage 1 through stage 4 chronic kidney disease, or unspecified chronic kidney disease: Secondary | ICD-10-CM | POA: Diagnosis not present

## 2021-01-15 DIAGNOSIS — U071 COVID-19: Secondary | ICD-10-CM | POA: Diagnosis not present

## 2021-01-17 DIAGNOSIS — J9621 Acute and chronic respiratory failure with hypoxia: Secondary | ICD-10-CM | POA: Diagnosis not present

## 2021-01-17 DIAGNOSIS — U071 COVID-19: Secondary | ICD-10-CM | POA: Diagnosis not present

## 2021-01-17 DIAGNOSIS — I5033 Acute on chronic diastolic (congestive) heart failure: Secondary | ICD-10-CM | POA: Diagnosis not present

## 2021-01-17 DIAGNOSIS — I480 Paroxysmal atrial fibrillation: Secondary | ICD-10-CM | POA: Diagnosis not present

## 2021-01-17 DIAGNOSIS — I130 Hypertensive heart and chronic kidney disease with heart failure and stage 1 through stage 4 chronic kidney disease, or unspecified chronic kidney disease: Secondary | ICD-10-CM | POA: Diagnosis not present

## 2021-01-17 DIAGNOSIS — E1122 Type 2 diabetes mellitus with diabetic chronic kidney disease: Secondary | ICD-10-CM | POA: Diagnosis not present

## 2021-01-20 ENCOUNTER — Encounter (HOSPITAL_BASED_OUTPATIENT_CLINIC_OR_DEPARTMENT_OTHER): Payer: Medicare Other | Admitting: Internal Medicine

## 2021-01-20 ENCOUNTER — Other Ambulatory Visit: Payer: Self-pay

## 2021-01-20 DIAGNOSIS — I272 Pulmonary hypertension, unspecified: Secondary | ICD-10-CM | POA: Diagnosis not present

## 2021-01-20 DIAGNOSIS — I87331 Chronic venous hypertension (idiopathic) with ulcer and inflammation of right lower extremity: Secondary | ICD-10-CM | POA: Diagnosis not present

## 2021-01-20 DIAGNOSIS — L97811 Non-pressure chronic ulcer of other part of right lower leg limited to breakdown of skin: Secondary | ICD-10-CM | POA: Diagnosis not present

## 2021-01-20 DIAGNOSIS — I5032 Chronic diastolic (congestive) heart failure: Secondary | ICD-10-CM | POA: Diagnosis not present

## 2021-01-20 DIAGNOSIS — E11622 Type 2 diabetes mellitus with other skin ulcer: Secondary | ICD-10-CM | POA: Diagnosis not present

## 2021-01-20 DIAGNOSIS — Z7901 Long term (current) use of anticoagulants: Secondary | ICD-10-CM | POA: Diagnosis not present

## 2021-01-20 DIAGNOSIS — Z9981 Dependence on supplemental oxygen: Secondary | ICD-10-CM | POA: Diagnosis not present

## 2021-01-20 DIAGNOSIS — I890 Lymphedema, not elsewhere classified: Secondary | ICD-10-CM | POA: Diagnosis not present

## 2021-01-20 DIAGNOSIS — I4891 Unspecified atrial fibrillation: Secondary | ICD-10-CM | POA: Diagnosis not present

## 2021-01-20 DIAGNOSIS — J449 Chronic obstructive pulmonary disease, unspecified: Secondary | ICD-10-CM | POA: Diagnosis not present

## 2021-01-20 DIAGNOSIS — I110 Hypertensive heart disease with heart failure: Secondary | ICD-10-CM | POA: Diagnosis not present

## 2021-01-20 NOTE — Progress Notes (Signed)
Joseph, [name] ([id number]) Visit Report for 01/20/2021 HPI Details Patient Name: Date of Service: [place], [name] 01/20/2021 2:45 PM Medical Record Number: [id number] Patient Account Number: [id number] Date of Birth/Sex: Treating RN: 05-Jun-1932 (87 y.o. [name] Primary Care Provider: [name] Provider: Treating Provider/Extender: Bebe Shaggy in Treatment: 4 History of Present Illness HPI Description: ADMISSION 12/19/2020 This is a 87 year old man who arrives accompanied by his son who used to be a patient in this clinic. He has had chronic wounds on the right anterior lower leg which have been opening and closing on and off for the last 7 to 8 months. He says these will blister then opened up then closed and blister opened up and closed. He has [facility] and I think they have been applying alginate and 3 layer wraps however apparently the 3 layer light wrap was not put on properly. He is also had compression stockings but does not feel he can get these on. Past medical history includes COPD on chronic oxygen, pulmonary hypertension, obstructive sleep apnea, type 2 diabetes, diastolic heart failure, chronic kidney disease stage III and atrial fibrillation on Eliquis ABI in our clinic was 1.04 on the right 01/13/2021 patient admitted to the clinic over 3 weeks ago. Since he was admitted here he was hospitalized from 12/26/2020 through 01/02/2021 with Covid pneumonia. He improved with treatment for bilateral infiltrates. Discharged on 3 L of nasal cannula. He was also felt to have acute on chronic diastolic heart failure. Does not look like they did anything particular with the wounds on the right anterior lower leg. The patient has a large area on the right anterior lower leg that is oval-shaped and deep pigmented. He has several small open areas scattered throughout the surface of this. This does not look too much different  from last time. It is difficult to get a history of him exactly how and when this happened he says it actually happened during the recent hospitalization however he certainly had this before that because we saw him 1 time in the clinic. He did has had a vein stripping in this area done remotely but I cannot really see the issue or the connection here. He does not describe a lot of pain 2/14; patient arrives in clinic today with a much better looking area on the right anterior lower leg. Only a few small open areas remaining what was a fully de- epithelialized area. I must say I had some concerns about this being a primary cutaneous problem when I first saw this with the major differential being chronic stasis dermatitis. He arrives in clinic today with the area almost completely epithelialized. I feel better about the idea of not biopsying this. He is going to need compression stockings and we spent some time talking about this perhaps Farrow wraps. He has lymphedema in this area is certainly will not maintain skin integrity if the edema is not controlled. Surface is very adherent to underlying subcutaneous tissue Electronic Signature(s) Signed: 01/20/2021 5:51:45 PM By: Linton Ham MD Entered By: Linton Ham on 01/20/2021 17:43:20 -------------------------------------------------------------------------------- Physical Exam Details Patient Name: Date of Service: [name], Joseph BERT A. 01/20/2021 2:45 PM Medical Record Number: [id number] Patient Account Number: [id number] Date of Birth/Sex: Treating RN: 1932-04-30 (87 y.o. [name] Primary Care Provider: [name] Provider: Treating Provider/Extender: Bebe Shaggy in Treatment: 4 Constitutional Patient is hypotensive.. Pulse regular and within target  range for patient.Marland Kitchen Respirations regular, non-labored and within target range.. Temperature is normal and within the target range for the  patient.Marland Kitchen Appears in no distress. Cardiovascular Needle pulses are palpable. Notes Wound exam; the patient has a large area on the right anterior tibial area. All of this appears to be fully epithelialized. There is some loss of pigmentation although that is to be expected with chronic inflammation. Few small areas remain open enough that we wanted to wrap this again this week we have been using TCA to control the inflammation and that we will continue. Electronic Signature(s) Signed: 01/20/2021 5:51:45 PM By: Linton Ham MD Entered By: Linton Ham on 01/20/2021 17:44:33 -------------------------------------------------------------------------------- Physician Orders Details Patient Name: Date of Service: [name], Joseph BERT A. 01/20/2021 2:45 PM Medical Record Number: [id number] Patient Account Number: [id number] Date of Birth/Sex: Treating RN: 14-Dec-1931 (87 y.o. [name] Primary Care Provider: [name] Provider: Treating Provider/Extender: Bebe Shaggy in Treatment: 4 Verbal / Phone Orders: No Diagnosis Coding ICD-10 Coding Code Description I89.0 Lymphedema, not elsewhere classified I87.331 Chronic venous hypertension (idiopathic) with ulcer and inflammation of right lower extremity L97.811 Non-pressure chronic ulcer of other part of right lower leg limited to breakdown of skin Follow-up Appointments Return Appointment in 1 week. Bathing/ Shower/ Hygiene May shower with protection but do not get wound dressing(s) wet. Edema Control - Lymphedema / SCD / Other Elevate legs to the level of the heart or above for 30 minutes daily and/or when sitting, a frequency of: - throughout the day 3-4 times a day. Avoid standing for long periods of time. Patient to wear own compression stockings every day. - Apply compression stocking to left leg in the morning and remove at night. Apply lotion every night after removing  stocking. Exercise regularly [place] wound care orders this week; continue Home Health for wound care. May utilize formulary equivalent dressing for wound treatment orders unless otherwise specified. - Apply thin layer of Triamcinolone cream to wound bed with each dressing change, under alginate. MD to send prescription for Triamcinolone to pharmacy. Dressing changes to be completed by [place] on Monday / Wednesday / Friday except when patient has scheduled visit at [place] clinic will change on Mondays Other [place] Orders/Instructions: - [facility] twice a week Wound Treatment Wound #1 - Lower Leg Wound Laterality: Right, Anterior Cleanser: Soap and Water 3 x Per Week/30 Days Discharge Instructions: Home Health to wash leg with soap and water with dressing changes. May shower and wash wound with dial antibacterial soap and water prior to dressing change. Peri-Wound Care: Zinc Oxide Ointment 30g tube 3 x Per Week/30 Days Discharge Instructions: Apply Zinc Oxide as needed for maceration, wetness to periwound with each dressing change Peri-Wound Care: Sween Lotion (Moisturizing lotion) 3 x Per Week/30 Days Discharge Instructions: home health to Apply moisturizing lotion as directed Topical: Triamcinolone 3 x Per Week/30 Days Discharge Instructions: Apply thin layer of Triamcinolone on wound bed under alginate Prim Dressing: KerraCel Ag Gelling Fiber Dressing, 4x5 in (silver alginate) 3 x Per Week/30 Days ary Discharge Instructions: Apply silver alginate to wound bed as instructed Secondary Dressing: Woven Gauze Sponge, Non-Sterile 4x4 in 3 x Per Week/30 Days Discharge Instructions: Apply over primary dressing as directed. Secondary Dressing: ABD Pad, 8x10 3 x Per Week/30 Days Discharge Instructions: Apply over primary dressing as directed. Compression Wrap: ThreePress (3 layer compression wrap) 3 x Per Week/30 Days Discharge Instructions: Apply three  layer  compression as directed. Apply just above toes and just below the knee (tibial tuberosity). Compression Stockings: Jobst Farrow Wrap 4000 (DME) Left Leg Compression Amount: 30-40 mmHG Right Leg Compression Amount: 30-40 mmHG Discharge Instructions: Apply Francia Greaves daily as instructed. Apply first thing in the morning, remove at night before bed. Electronic Signature(s) Signed: 01/20/2021 5:50:50 PM By: Levan Hurst RN, BSN Signed: 01/20/2021 5:51:45 PM By: Linton Ham MD Entered By: Levan Hurst on 01/20/2021 16:54:30 -------------------------------------------------------------------------------- Problem List Details Patient Name: Date of Service: [place], [place] BERT A. 01/20/2021 2:45 PM Medical Record Number: [id number] Patient Account Number: [id number] Date of Birth/Sex: Treating RN: 01-28-1932 (87 y.o. [name] Primary Care Provider: [name] Provider: Treating Provider/Extender: Bebe Shaggy in Treatment: 4 Active Problems ICD-10 Encounter Code Description Active Date MDM Diagnosis I89.0 Lymphedema, not elsewhere classified 12/19/2020 No Yes I87.331 Chronic venous hypertension (idiopathic) with ulcer and inflammation of right 12/19/2020 No Yes lower extremity L97.811 Non-pressure chronic ulcer of other part of right lower leg limited to breakdown 12/19/2020 No Yes of skin Inactive Problems Resolved Problems Electronic Signature(s) Signed: 01/20/2021 5:51:45 PM By: Linton Ham MD Entered By: Linton Ham on 01/20/2021 17:41:20 -------------------------------------------------------------------------------- Progress Note Details Patient Name: Date of Service: [name], Joseph BERT A. 01/20/2021 2:45 PM Medical Record Number: [id number] Patient Account Number: [id number] Date of Birth/Sex: Treating RN: [birth year] (87 y.o. [name] Primary Care Provider: [name]  Provider: Treating Provider/Extender: Bebe Shaggy in Treatment: 4 Subjective History of Present Illness (HPI) ADMISSION 12/19/2020 This is a 87 year old man who arrives accompanied by his son who used to be a patient in this clinic. He has had chronic wounds on the right anterior lower leg which have been opening and closing on and off for the last 7 to 8 months. He says these will blister then opened up then closed and blister opened up and closed. He has [facility] and I think they have been applying alginate and 3 layer wraps however apparently the 3 layer light wrap was not put on properly. He is also had compression stockings but does not feel he can get these on. Past medical history includes COPD on chronic oxygen, pulmonary hypertension, obstructive sleep apnea, type 2 diabetes, diastolic heart failure, chronic kidney disease stage III and atrial fibrillation on Eliquis ABI in our clinic was 1.04 on the right 01/13/2021 patient admitted to the clinic over 3 weeks ago. Since he was admitted here he was hospitalized from 12/26/2020 through 01/02/2021 with Covid pneumonia. He improved with treatment for bilateral infiltrates. Discharged on 3 L of nasal cannula. He was also felt to have acute on chronic diastolic heart failure. Does not look like they did anything particular with the wounds on the right anterior lower leg. The patient has a large area on the right anterior lower leg that is oval-shaped and deep pigmented. He has several small open areas scattered throughout the surface of this. This does not look too much different from last time. It is difficult to get a history of him exactly how and when this happened he says it actually happened during the recent hospitalization however he certainly had this before that because we saw him 1 time in the clinic. He did has had a vein stripping in this area done remotely but I cannot really see the issue or the  connection here. He does not describe a lot of pain 2/14; patient  arrives in clinic today with a much better looking area on the right anterior lower leg. Only a few small open areas remaining what was a fully de- epithelialized area. I must say I had some concerns about this being a primary cutaneous problem when I first saw this with the major differential being chronic stasis dermatitis. He arrives in clinic today with the area almost completely epithelialized. I feel better about the idea of not biopsying this. He is going to need compression stockings and we spent some time talking about this perhaps Farrow wraps. He has lymphedema in this area is certainly will not maintain skin integrity if the edema is not controlled. Surface is very adherent to underlying subcutaneous tissue Objective Constitutional Patient is hypotensive.. Pulse regular and within target range for patient.Marland Kitchen Respirations regular, non-labored and within target range.. Temperature is normal and within the target range for the patient.Marland Kitchen Appears in no distress. Vitals Time Taken: 2:57 PM, Height: 69 in, Weight: 190 lbs, BMI: 28.1, Temperature: 98.3 F, Pulse: 90 bpm, Respiratory Rate: 18 breaths/min, Blood Pressure: 189/82 mmHg, Capillary Blood Glucose: 95 mg/dl. Cardiovascular Needle pulses are palpable. General Notes: Wound exam; the patient has a large area on the right anterior tibial area. All of this appears to be fully epithelialized. There is some loss of pigmentation although that is to be expected with chronic inflammation. Few small areas remain open enough that we wanted to wrap this again this week we have been using TCA to control the inflammation and that we will continue. Integumentary (Hair, Skin) Wound #1 status is Open. Original cause of wound was Gradually Appeared. The wound is located on the Right,Anterior Lower Leg. The wound measures 5.7cm length x 2.8cm width x 0.1cm depth; 12.535cm^2 area and  1.253cm^3 volume. There is no tunneling or undermining noted. There is a large amount of serosanguineous drainage noted. The wound margin is flat and intact. There is large (67-100%) red granulation within the wound bed. There is no necrotic tissue within the wound bed. Assessment Active Problems ICD-10 Lymphedema, not elsewhere classified Chronic venous hypertension (idiopathic) with ulcer and inflammation of right lower extremity Non-pressure chronic ulcer of other part of right lower leg limited to breakdown of skin Procedures Wound #1 Pre-procedure diagnosis of Wound #1 is a Venous Leg Ulcer located on the Right,Anterior Lower Leg . There was a Three Layer Compression Therapy Procedure by Levan Hurst, RN. Post procedure Diagnosis Wound #1: Same as Pre-Procedure Plan Follow-up Appointments: Return Appointment in 1 week. Bathing/ Shower/ Hygiene: May shower with protection but do not get wound dressing(s) wet. Edema Control - Lymphedema / SCD / Other: Elevate legs to the level of the heart or above for 30 minutes daily and/or when sitting, a frequency of: - throughout the day 3-4 times a day. Avoid standing for long periods of time. Patient to wear own compression stockings every day. - Apply compression stocking to left leg in the morning and remove at night. Apply lotion every night after removing stocking. Exercise regularly Home Health: New wound care orders this week; continue Home Health for wound care. May utilize formulary equivalent dressing for wound treatment orders unless otherwise specified. - Apply thin layer of Triamcinolone cream to wound bed with each dressing change, under alginate. MD to send prescription for Triamcinolone to pharmacy. Dressing changes to be completed by [place] on Monday / Wednesday / Friday except when patient has scheduled visit at [place] clinic will change on Mondays Other Home  Health Orders/Instructions: - [facility]  twice a week WOUND #1: - Lower Leg Wound Laterality: Right, Anterior Cleanser: Soap and Water 3 x Per Week/30 Days Discharge Instructions: Home Health to wash leg with soap and water with dressing changes. May shower and wash wound with dial antibacterial soap and water prior to dressing change. Peri-Wound Care: Zinc Oxide Ointment 30g tube 3 x Per Week/30 Days Discharge Instructions: Apply Zinc Oxide as needed for maceration, wetness to periwound with each dressing change Peri-Wound Care: Sween Lotion (Moisturizing lotion) 3 x Per Week/30 Days Discharge Instructions: home health to Apply moisturizing lotion as directed Topical: Triamcinolone 3 x Per Week/30 Days Discharge Instructions: Apply thin layer of Triamcinolone on wound bed under alginate Prim Dressing: KerraCel Ag Gelling Fiber Dressing, 4x5 in (silver alginate) 3 x Per Week/30 Days ary Discharge Instructions: Apply silver alginate to wound bed as instructed Secondary Dressing: Woven Gauze Sponge, Non-Sterile 4x4 in 3 x Per Week/30 Days Discharge Instructions: Apply over primary dressing as directed. Secondary Dressing: ABD Pad, 8x10 3 x Per Week/30 Days Discharge Instructions: Apply over primary dressing as directed. Com pression Wrap: ThreePress (3 layer compression wrap) 3 x Per Week/30 Days Discharge Instructions: Apply three layer compression as directed. Apply just above toes and just below the knee (tibial tuberosity). Com pression Stockings: Jobst Farrow Wrap 4000 (DME) Compression Amount: 30-40 mmHg (left) Compression Amount: 30-40 mmHg (right) Discharge Instructions: Apply Francia Greaves daily as instructed. Apply first thing in the morning, remove at night before bed. 1. We reapplied silver alginate to the area with TCA still under compression 2. I am expecting this to be healed next week 3. We emphasized the need for compression stockings. The patient lives at home with his wife hopefully she is able to help him put these  on. Electronic Signature(s) Signed: 01/20/2021 5:51:45 PM By: Linton Ham MD Entered By: Linton Ham on 01/20/2021 17:46:24 -------------------------------------------------------------------------------- SuperBill Details Patient Name: Date of Service: [name], Joseph BERT A. 01/20/2021 Medical Record Number: [id number] Patient Account Number: [id number] Date of Birth/Sex: Treating RN: 01/28/1932 (87 y.o. [name] Primary Care Provider: [name] Provider: Treating Provider/Extender: Bebe Shaggy in Treatment: 4 Diagnosis Coding ICD-10 Codes Code Description I89.0 Lymphedema, not elsewhere classified I87.331 Chronic venous hypertension (idiopathic) with ulcer and inflammation of right lower extremity L97.811 Non-pressure chronic ulcer of other part of right lower leg limited to breakdown of skin Facility Procedures CPT4 Code: [id number] Description: (Facility Use Only) [phone] - [place] L[facility] RT LEG Modifier: Quantity: 1 Physician Procedures : CPT4 Code Description Modifier [id number] - WC PHYS LEVEL 3 - EST PT ICD-10 Diagnosis Description I89.0 Lymphedema, not elsewhere classified I87.331 Chronic venous hypertension (idiopathic) with ulcer and inflammation of right lower extremity  L97.811 Non-pressure chronic ulcer of other part of right lower leg limited to breakdown of skin Quantity: 1 Electronic Signature(s) Signed: 01/20/2021 5:51:45 PM By: Linton Ham MD Entered By: Linton Ham on 01/20/2021 17:46:49

## 2021-01-21 ENCOUNTER — Other Ambulatory Visit: Payer: Self-pay | Admitting: *Deleted

## 2021-01-21 ENCOUNTER — Other Ambulatory Visit: Payer: Self-pay | Admitting: Internal Medicine

## 2021-01-21 ENCOUNTER — Encounter: Payer: Self-pay | Admitting: *Deleted

## 2021-01-21 DIAGNOSIS — I87331 Chronic venous hypertension (idiopathic) with ulcer and inflammation of right lower extremity: Secondary | ICD-10-CM | POA: Diagnosis not present

## 2021-01-21 DIAGNOSIS — I272 Pulmonary hypertension, unspecified: Secondary | ICD-10-CM | POA: Diagnosis not present

## 2021-01-21 DIAGNOSIS — U071 COVID-19: Secondary | ICD-10-CM | POA: Diagnosis not present

## 2021-01-21 DIAGNOSIS — I5032 Chronic diastolic (congestive) heart failure: Secondary | ICD-10-CM | POA: Diagnosis not present

## 2021-01-21 DIAGNOSIS — I872 Venous insufficiency (chronic) (peripheral): Secondary | ICD-10-CM | POA: Diagnosis not present

## 2021-01-21 DIAGNOSIS — J9611 Chronic respiratory failure with hypoxia: Secondary | ICD-10-CM | POA: Diagnosis not present

## 2021-01-21 DIAGNOSIS — J9621 Acute and chronic respiratory failure with hypoxia: Secondary | ICD-10-CM | POA: Diagnosis not present

## 2021-01-21 DIAGNOSIS — E1122 Type 2 diabetes mellitus with diabetic chronic kidney disease: Secondary | ICD-10-CM | POA: Diagnosis not present

## 2021-01-21 DIAGNOSIS — I5033 Acute on chronic diastolic (congestive) heart failure: Secondary | ICD-10-CM | POA: Diagnosis not present

## 2021-01-21 DIAGNOSIS — I480 Paroxysmal atrial fibrillation: Secondary | ICD-10-CM | POA: Diagnosis not present

## 2021-01-21 DIAGNOSIS — G4733 Obstructive sleep apnea (adult) (pediatric): Secondary | ICD-10-CM | POA: Diagnosis not present

## 2021-01-21 DIAGNOSIS — I130 Hypertensive heart and chronic kidney disease with heart failure and stage 1 through stage 4 chronic kidney disease, or unspecified chronic kidney disease: Secondary | ICD-10-CM | POA: Diagnosis not present

## 2021-01-21 NOTE — Patient Outreach (Signed)
[facility]) Care Management  [place]  01/21/2021   DAREK EIFLER June 19, 1932 [id number]   Initial Referral received : 08/15/20 Referral source:Hospital Liaision Referral reason: Complex care and disease management of Heart failure . Insurance: Medicare  [facility] Admission :1/20-1/27/22 Dx: Acute Hypoxemic respiratory failure - Covid 19, Acute on Chronic Heart failure .  PMHX;includes chronic diastolic heart failure, bilateral pleural effusions, chronic kidney disease stage 3,Hypertension , type 2 Diabetes, OSAwith CPAP,home oxygen ,COPD, venous stasis dermatitis,Dyslipidemia, pulmonary hypertension.  Subjective:  Outreach call to patient daughter [name] she reports patient is doing better, eating a little bit more. She reports that he continues to tolerate mobility in the home using his cane. She states family continues providing support, with transportation, meals.  She is agreeable to my  follow up to patient on today.   Successful outreach call to patient , he reports that he is doing pretty good. He states that he is getter a little better. He states that he doesn't recall having home physical therapy services .He discussed visit at wound center and legs are almost healed. He reports support stocking have been ordered for him and hopeful will get at next visit .  He denies worsening shortness of breath, cough continues to wear oxygen as prescribed.  He denies increase in fluid , swelling he states he doesn't weigh every day, states recent weight down to 150 range unable to states exact.  He reports appetite slowly improving not eating much , appetite still down. He acknowledges family providing meals, he just doesn't have much appetite. Discussed protein drink supplements benefits such as glucerna, states he will try, but he states will be watching fluid intake.  Patient report continuing to manage his daily medications, reports blood  sugar this am 85 before breakfast, he denies having low blood sugar symptoms or recent low reading of 70 or below. He is able to state actions steps to treat low blood sugar, reinforced notifying MD of low blood sugar occurrences for follow up.   Care Coordination call to [place] verifies patient [facility] services being nurse and physical therapy. Able to leave a HIPAA compliant message  return  for [facility] to collaborate  regarding patient .  Encounter Medications:  Outpatient Encounter Medications as of 01/21/2021  Medication Sig  . acetaminophen (TYLENOL) 325 MG tablet Take 650 mg by mouth every 6 (six) hours as needed (for pain.).  Marland Kitchen amLODipine (NORVASC) 5 MG tablet Take 5 mg by mouth daily.  Marland Kitchen apixaban (ELIQUIS) 2.5 MG TABS tablet Take 1 tablet (2.5 mg total) by mouth 2 (two) times daily.  . benazepril (LOTENSIN) 40 MG tablet Take 1 tablet (40 mg total) by mouth daily.  . cloNIDine (CATAPRES) 0.1 MG tablet Take 0.1 mg by mouth 2 (two) times daily.  Marland Kitchen dexamethasone (DECADRON) 6 MG tablet Take 1 tablet (6 mg total) by mouth daily.  Marland Kitchen ezetimibe (ZETIA) 10 MG tablet Take 10 mg by mouth daily.  . furosemide (LASIX) 80 MG tablet Take 1 tablet (80 mg total) by mouth 2 (two) times daily.  Marland Kitchen HUMALOG KWIKPEN 100 UNIT/ML KiwkPen Inject 6-8 Units into the skin 3 (three) times daily.   Marland Kitchen icosapent Ethyl (VASCEPA) 1 g capsule TAKE 2 CAPSULES(2 GRAMS) BY MOUTH TWICE DAILY (Patient taking differently: Take 2 g by mouth 2 (two) times daily.)  . LANTUS SOLOSTAR 100 UNIT/ML Solostar Pen Inject 18 Units into the skin every evening.   Marland Kitchen levothyroxine (SYNTHROID) 100 MCG  tablet Take 100 mcg by mouth daily before breakfast.  . potassium chloride (KLOR-CON) 10 MEQ tablet Take 1 tablet (10 mEq total) by mouth 2 (two) times daily.  Marland Kitchen Propylene Glycol (SYSTANE BALANCE) 0.6 % SOLN Place 1 drop into both eyes daily as needed (dry eyes).  . rosuvastatin (CRESTOR) 20 MG tablet TAKE 1 TABLET BY MOUTH EVERY DAY  (Patient taking differently: Take 20 mg by mouth daily.)   No facility-administered encounter medications on file as of 01/21/2021.    Functional Status:  In your present state of health, do you have any difficulty performing the following activities: 12/26/2020 12/26/2020  Hearing? - -  Vision? - -  Difficulty concentrating or making decisions? - -  Walking or climbing stairs? - -  Dressing or bathing? - -  Doing errands, shopping? N N  Some recent data might be hidden    Fall/Depression Screening: Fall Risk  01/21/2021 08/19/2020  Falls in the past year? 1 0  Number falls in past yr: 0 0  Injury with Fall? 0 0  Risk for fall due to : History of fall(s);Impaired mobility Impaired mobility  Follow up Falls evaluation completed;Education provided;Falls prevention discussed Falls evaluation completed;Education provided   [facility] 2/9 Scores 01/21/2021 08/19/2020  PHQ - 2 Score 0 -  Exception Documentation - Other- indicate reason in comment box  Not completed - Unable ask patient requested RNCM complete assessment with wife.    Assessment:  Goals Addressed            This Visit's Progress   . Make and Keep All Appointments   On track    Follow Up Date 01/21/21 Timeframe:  Long-Range Goal Priority:  Medium Start Date:   10/18/20                          Expected End Date:  02/03/21                       - call to cancel if needed - keep a calendar with appointment dates    Why is this important?   Part of staying healthy is seeing the doctor for follow-up care.  If you forget your appointments, there are some things you can do to stay on track.    Notes:  Discussed next cardiology appointment, discussed family support for transportation     . Track and Manage Activity and Exertion   On track    Follow Up Date 01/21/21 Timeframe:  Long-Range Goal Priority:  Medium Start Date:     10/18/20                        Expected End Date:  02/03/21                       - follow  activity or exercise plan - pace activity allowing for rest    Why is this important?   Exercising is very important when managing your heart failure.  It will help your heart get stronger.    Notes:  Encouraged using cane/walker for safety, wearing oxygen as prescribed. Call to [place] home [facility] regarding present services . Reviewed no recent falls. Discussed patient progress toward feeling stronger     . Track and Manage Fluids and Swelling   On track    Follow Up Date 01/21/21 Timeframe:  Long-Range Goal Priority:  [employer]  Date:    10/18/20                         Expected End Date:   02/03/21                      - use salt in moderation - watch for swelling in feet, ankles and legs every day - weigh myself daily    Why is this important?   It is important to check your weight daily and watch how much salt and liquids you have.  It will help you to manage your heart failure.   Note:Encouraged keeping legs elevated above level of heart to help reduce swelling in lower legs increase circulation promote healing of wound right leg, discussed how to elevate legs to help with decreasing swelling      . Track and Manage My Blood Pressure   Not on track    Follow Up Date 02/03/21 Timeframe:  Long-Range Goal Priority:  Medium Start Date:   10/18/20                          Expected End Date:   02/04/20                      - check blood pressure weekly - write blood pressure results in a log or diary    Why is this important?   You won't feel high blood pressure, but it can still hurt your blood vessels.  High blood pressure can cause heart or kidney problems. It can also cause a stroke.  Making lifestyle changes like losing a little weight or eating less salt will help.  Checking your blood pressure at home and at different times of the day can help to control blood pressure.  If the doctor prescribes medicine remember to take it the way the doctor ordered.  Call the  office if you cannot afford the medicine or if there are questions about it.     Notes:Encouraged monitoring blood pressure , has not checked since d/c.     Marland Kitchen Track and Manage Symptoms   On track    Follow Up Date 01/21/21 Timeframe:  Long-Range Goal Priority:  High Start Date:    10/18/20                         Expected End Date:  02/03/21                       - know when to call the doctor - track symptoms and what helps feel better or worse    Why is this important?   You will be able to handle your symptoms better if you keep track of them.  Making some simple changes to your lifestyle will help.  Eating healthy is one thing you can do to take good care of yourself.    Notes:  Reinforced worsening symptoms to identify, when to call MD , denies sudden weight gain reports decreased swelling , expressed being pleased with progress, limiting fluids       Plan:  Follow-up:  Patient agrees to Care Plan and Follow-up., agreeable to return call in the next 2 weeks.  Will send provider visit note.   Joylene Draft, RN, BSN  [place] Management Coordinator  [phone]- Mobile [phone]- Toll  Free Main Office

## 2021-01-22 DIAGNOSIS — J9621 Acute and chronic respiratory failure with hypoxia: Secondary | ICD-10-CM | POA: Diagnosis not present

## 2021-01-22 DIAGNOSIS — I130 Hypertensive heart and chronic kidney disease with heart failure and stage 1 through stage 4 chronic kidney disease, or unspecified chronic kidney disease: Secondary | ICD-10-CM | POA: Diagnosis not present

## 2021-01-22 DIAGNOSIS — I480 Paroxysmal atrial fibrillation: Secondary | ICD-10-CM | POA: Diagnosis not present

## 2021-01-22 DIAGNOSIS — N184 Chronic kidney disease, stage 4 (severe): Secondary | ICD-10-CM | POA: Diagnosis not present

## 2021-01-22 DIAGNOSIS — E1122 Type 2 diabetes mellitus with diabetic chronic kidney disease: Secondary | ICD-10-CM | POA: Diagnosis not present

## 2021-01-22 DIAGNOSIS — I5033 Acute on chronic diastolic (congestive) heart failure: Secondary | ICD-10-CM | POA: Diagnosis not present

## 2021-01-22 DIAGNOSIS — U071 COVID-19: Secondary | ICD-10-CM | POA: Diagnosis not present

## 2021-01-23 DIAGNOSIS — E1122 Type 2 diabetes mellitus with diabetic chronic kidney disease: Secondary | ICD-10-CM | POA: Diagnosis not present

## 2021-01-23 DIAGNOSIS — I130 Hypertensive heart and chronic kidney disease with heart failure and stage 1 through stage 4 chronic kidney disease, or unspecified chronic kidney disease: Secondary | ICD-10-CM | POA: Diagnosis not present

## 2021-01-23 DIAGNOSIS — U071 COVID-19: Secondary | ICD-10-CM | POA: Diagnosis not present

## 2021-01-23 DIAGNOSIS — I480 Paroxysmal atrial fibrillation: Secondary | ICD-10-CM | POA: Diagnosis not present

## 2021-01-23 DIAGNOSIS — J9621 Acute and chronic respiratory failure with hypoxia: Secondary | ICD-10-CM | POA: Diagnosis not present

## 2021-01-23 DIAGNOSIS — I5033 Acute on chronic diastolic (congestive) heart failure: Secondary | ICD-10-CM | POA: Diagnosis not present

## 2021-01-24 DIAGNOSIS — J9621 Acute and chronic respiratory failure with hypoxia: Secondary | ICD-10-CM | POA: Diagnosis not present

## 2021-01-24 DIAGNOSIS — E1122 Type 2 diabetes mellitus with diabetic chronic kidney disease: Secondary | ICD-10-CM | POA: Diagnosis not present

## 2021-01-24 DIAGNOSIS — U071 COVID-19: Secondary | ICD-10-CM | POA: Diagnosis not present

## 2021-01-24 DIAGNOSIS — I130 Hypertensive heart and chronic kidney disease with heart failure and stage 1 through stage 4 chronic kidney disease, or unspecified chronic kidney disease: Secondary | ICD-10-CM | POA: Diagnosis not present

## 2021-01-24 DIAGNOSIS — I480 Paroxysmal atrial fibrillation: Secondary | ICD-10-CM | POA: Diagnosis not present

## 2021-01-24 DIAGNOSIS — I5033 Acute on chronic diastolic (congestive) heart failure: Secondary | ICD-10-CM | POA: Diagnosis not present

## 2021-01-27 ENCOUNTER — Encounter (HOSPITAL_BASED_OUTPATIENT_CLINIC_OR_DEPARTMENT_OTHER): Payer: Medicare Other | Admitting: Internal Medicine

## 2021-01-27 ENCOUNTER — Other Ambulatory Visit: Payer: Self-pay

## 2021-01-27 DIAGNOSIS — I110 Hypertensive heart disease with heart failure: Secondary | ICD-10-CM | POA: Diagnosis not present

## 2021-01-27 DIAGNOSIS — Z9981 Dependence on supplemental oxygen: Secondary | ICD-10-CM | POA: Diagnosis not present

## 2021-01-27 DIAGNOSIS — I272 Pulmonary hypertension, unspecified: Secondary | ICD-10-CM | POA: Diagnosis not present

## 2021-01-27 DIAGNOSIS — I890 Lymphedema, not elsewhere classified: Secondary | ICD-10-CM | POA: Diagnosis not present

## 2021-01-27 DIAGNOSIS — E11622 Type 2 diabetes mellitus with other skin ulcer: Secondary | ICD-10-CM | POA: Diagnosis not present

## 2021-01-27 DIAGNOSIS — J449 Chronic obstructive pulmonary disease, unspecified: Secondary | ICD-10-CM | POA: Diagnosis not present

## 2021-01-27 DIAGNOSIS — L97812 Non-pressure chronic ulcer of other part of right lower leg with fat layer exposed: Secondary | ICD-10-CM | POA: Diagnosis not present

## 2021-01-27 DIAGNOSIS — L97811 Non-pressure chronic ulcer of other part of right lower leg limited to breakdown of skin: Secondary | ICD-10-CM | POA: Diagnosis not present

## 2021-01-27 DIAGNOSIS — I5032 Chronic diastolic (congestive) heart failure: Secondary | ICD-10-CM | POA: Diagnosis not present

## 2021-01-27 DIAGNOSIS — Z7901 Long term (current) use of anticoagulants: Secondary | ICD-10-CM | POA: Diagnosis not present

## 2021-01-27 DIAGNOSIS — I4891 Unspecified atrial fibrillation: Secondary | ICD-10-CM | POA: Diagnosis not present

## 2021-01-27 NOTE — Progress Notes (Signed)
**Note [name]-Identified via Obfuscation** [name], [name] ([id number]) Visit Report for 01/20/2021 Arrival Information Details Patient Name: Date of Service: [name], [name] 01/20/2021 2:45 PM Medical Record Number: [id number] Patient Account Number: [id number] Date of Birth/Sex: Treating RN: [birth year] (87 y.o. [name] Primary Care Talmage Teaster: [name] Larhonda Dettloff: Treating Angelise Petrich/Extender: Bebe Shaggy in Treatment: 4 Visit Information History Since Last Visit Added or deleted any medications: No Patient Arrived: Ambulatory Any new allergies or adverse reactions: No Arrival Time: 14:55 Had a fall or experienced change in No Accompanied By: son activities of daily living that may affect Transfer Assistance: None risk of falls: Patient Identification Verified: Yes Signs or symptoms of abuse/neglect since last visito No Secondary Verification Process Completed: Yes Hospitalized since last visit: No Patient Requires Transmission-Based Precautions: No Implantable device outside of the clinic excluding No Patient Has Alerts: Yes cellular tissue based products placed in the center Patient Alerts: Patient on Blood Thinner since last visit: Has Dressing in Place as Prescribed: Yes Pain Present Now: No Electronic Signature(s) Signed: 01/27/2021 7:57:36 AM By: Sandre Kitty Entered By: Sandre Kitty on 01/20/2021 14:56:56 -------------------------------------------------------------------------------- Compression Therapy Details Patient Name: Date of Service: [name], Joseph BERT A. 01/20/2021 2:45 PM Medical Record Number: [id number] Patient Account Number: [id number] Date of Birth/Sex: Treating RN: 1932-07-26 (87 y.o. [name] Primary Care Callen Vancuren: [name] Vihan Santagata: Treating Javius Sylla/Extender: Bebe Shaggy in Treatment: 4 Compression Therapy Performed for Wound Assessment: Wound #1 Right,Anterior Lower  Leg Performed By: Clinician Levan Hurst, RN Compression Type: Three Layer Post Procedure Diagnosis Same as Pre-procedure Electronic Signature(s) Signed: 01/20/2021 5:50:50 PM By: Levan Hurst RN, BSN Entered By: Levan Hurst on 01/20/2021 15:36:13 -------------------------------------------------------------------------------- Encounter Discharge Information Details Patient Name: Date of Service: [name], Joseph BERT A. 01/20/2021 2:45 PM Medical Record Number: [id number] Patient Account Number: [id number] Date of Birth/Sex: Treating RN: Apr 25, 1932 (87 y.o. [name] Primary Care Aziyah Provencal: [name] Desani Sprung: Treating Astoria Condon/Extender: Bebe Shaggy in Treatment: 4 Encounter Discharge Information Items Discharge Condition: Stable Ambulatory Status: Cane Discharge Destination: Home Transportation: Private Auto Accompanied By: son Schedule Follow-up Appointment: Yes Clinical Summary of Care: Electronic Signature(s) Signed: 01/20/2021 5:25:06 PM By: [name] Entered By: [name] on 01/20/2021 17:22:50 -------------------------------------------------------------------------------- Lower Extremity Assessment Details Patient Name: Date of Service: [name], [name] 01/20/2021 2:45 PM Medical Record Number: [id number] Patient Account Number: [id number] Date of Birth/Sex: Treating RN: 1932/09/16 (87 y.o. [name] Primary Care Cicily Bonano: [name] Zadin Lange: Treating Kalaya Infantino/Extender: Bebe Shaggy in Treatment: 4 Edema Assessment Assessed: [name]: No] [name]: No] Edema: [Left: Ye] [Right: s] Calf Left: Right: [name] of Measurement: From Medial Instep 42 cm 38 cm Ankle Left: Right: [name] of Measurement: From Medial Instep 25 cm 22 cm Knee To Floor Left: Right: From Medial Instep 43 cm 43 cm Vascular Assessment Pulses: Dorsalis Pedis Palpable: [Left:Yes]  [Right:Yes] Electronic Signature(s) Signed: 01/20/2021 5:50:50 PM By: Levan Hurst RN, BSN Entered By: Levan Hurst on 01/20/2021 15:41:07 -------------------------------------------------------------------------------- Multi Wound Chart Details Patient Name: Date of Service: [name], Joseph BERT A. 01/20/2021 2:45 PM Medical Record Number: [id number] Patient Account Number: [id number] Date of Birth/Sex: Treating RN: [birth year] (87 y.o. [name] Primary Care Trentin Knappenberger: [name] Tashonna Descoteaux: Treating Verlisa Vara/Extender: Bebe Shaggy in Treatment: 4 Vital Signs Height(in): 69 Capillary Blood Glucose(mg/dl): 95 Weight(lbs): 190 Pulse(bpm): 90 Body Mass Index(BMI): 28  Blood Pressure(mmHg): 189/82 Temperature(F): 98.3 Respiratory Rate(breaths/min): 18 Photos: [1:No Photos Right, Anterior Lower Leg] [N/A:N/A N/A] Wound Location: [1:Gradually Appeared] [N/A:N/A] Wounding Event: [1:Venous Leg Ulcer] [N/A:N/A] Primary Etiology: [1:Chronic Obstructive Pulmonary] [N/A:N/A] Comorbid History: [1:Disease (COPD), Sleep Apnea, Arrhythmia, Congestive Heart Failure, Hypertension, Myocardial Infarction, Peripheral Venous Disease, Type II Diabetes 10/07/2020] [N/A:N/A] Date Acquired: [1:4] [N/A:N/A] Weeks of Treatment: [1:Open] [N/A:N/A] Wound Status: [1:Yes] [N/A:N/A] Clustered Wound: [1:2] [N/A:N/A] Clustered Quantity: [1:5.7x2.8x0.1] [N/A:N/A] Measurements L x Joseph x D (cm) [1:12.535] [N/A:N/A] A (cm) : rea [1:1.253] [N/A:N/A] Volume (cm) : [1:79.30%] [N/A:N/A] % Reduction in Area: [1:79.30%] [N/A:N/A] % Reduction in Volume: [1:Full Thickness Without Exposed] [N/A:N/A] Classification: [1:Support Structures Large] [N/A:N/A] Exudate Amount: [1:Serosanguineous] [N/A:N/A] Exudate Type: [1:red, brown] [N/A:N/A] Exudate Color: [1:Flat and Intact] [N/A:N/A] Wound Margin: [1:Large (67-100%)] [N/A:N/A] Granulation Amount: [1:Red]  [N/A:N/A] Granulation Quality: [1:None Present (0%)] [N/A:N/A] Necrotic Amount: [1:Fascia: No] [N/A:N/A] Exposed Structures: [1:Fat Layer (Subcutaneous Tissue): No Tendon: No Muscle: No Joint: No Bone: No Large (67-100%)] [N/A:N/A] Epithelialization: [1:Compression Therapy] [N/A:N/A] Treatment Notes Wound #1 (Lower Leg) Wound Laterality: Right, Anterior Cleanser Soap and Water Discharge Instruction: Home Health to wash leg with soap and water with dressing changes. May shower and wash wound with dial antibacterial soap and water prior to dressing change. Peri-Wound Care Zinc Oxide Ointment 30g tube Discharge Instruction: Apply Zinc Oxide as needed for maceration, wetness to periwound with each dressing change Sween Lotion (Moisturizing lotion) Discharge Instruction: home health to Apply moisturizing lotion as directed Topical Triamcinolone Discharge Instruction: Apply thin layer of Triamcinolone on wound bed under alginate Primary Dressing KerraCel Ag Gelling Fiber Dressing, 4x5 in (silver alginate) Discharge Instruction: Apply silver alginate to wound bed as instructed Secondary Dressing Woven Gauze Sponge, Non-Sterile 4x4 in Discharge Instruction: Apply over primary dressing as directed. ABD Pad, 8x10 Discharge Instruction: Apply over primary dressing as directed. Secured With Compression Wrap ThreePress (3 layer compression wrap) Discharge Instruction: Apply three layer compression as directed. Apply just above toes and just below the knee (tibial tuberosity). Compression Stockings Jobst Farrow Wrap 4000 Quantity: 1 Left Leg Compression Amount: 30-40 mmHg Right Leg Compression Amount: 30-40 mmHg Discharge Instruction: Apply Francia Greaves daily as instructed. Apply first thing in the morning, remove at night before bed. Add-Ons Electronic Signature(s) Signed: 01/20/2021 5:50:50 PM By: Levan Hurst RN, BSN Signed: 01/20/2021 5:51:45 PM By: Linton Ham MD Entered By:  Linton Ham on 01/20/2021 17:41:27 -------------------------------------------------------------------------------- Multi-Disciplinary Care Plan Details Patient Name: Date of Service: [name], Joseph BERT A. 01/20/2021 2:45 PM Medical Record Number: [id number] Patient Account Number: [id number] Date of Birth/Sex: Treating RN: 02/16/1932 (87 y.o. [name] Primary Care Laterica Matarazzo: [name] Gem Conkle: Treating Chistina Roston/Extender: Bebe Shaggy in Treatment: 4 Active Inactive Abuse / Safety / Falls / Self Care Management Nursing Diagnoses: Potential for falls Goals: Patient will remain injury free related to falls Date Initiated: 12/19/2020 Target Resolution Date: 02/21/2021 Goal Status: Active Interventions: Assess Activities of Daily Living upon admission and as needed Assess fall risk on admission and as needed Notes: Nutrition Nursing Diagnoses: Potential for alteratiion in Nutrition/Potential for imbalanced nutrition Goals: Patient/caregiver agrees to and verbalizes understanding of need to obtain nutritional consultation Date Initiated: 12/19/2020 Date Inactivated: 01/13/2021 Target Resolution Date: 01/10/2021 Goal Status: Met Patient/caregiver will maintain therapeutic glucose control Date Initiated: 12/19/2020 Target Resolution Date: 02/21/2021 Goal Status: Active Interventions: Assess HgA1c results as ordered upon admission and as needed Provide education on elevated blood sugars and impact on wound healing Provide education on  nutrition Treatment Activities: Education provided on Nutrition : 01/13/2021 Obtain HgA1c : 12/19/2020 Notes: Wound/Skin Impairment Nursing Diagnoses: Knowledge deficit related to ulceration/compromised skin integrity Goals: Patient/caregiver will verbalize understanding of skin care regimen Date Initiated: 12/19/2020 Target Resolution Date: 02/21/2021 Goal Status: Active Interventions: Assess  patient/caregiver ability to obtain necessary supplies Assess patient/caregiver ability to perform ulcer/skin care regimen upon admission and as needed Provide education on ulcer and skin care Treatment Activities: Skin care regimen initiated : 12/19/2020 Topical wound management initiated : 12/19/2020 Notes: Electronic Signature(s) Signed: 01/20/2021 5:50:50 PM By: Levan Hurst RN, BSN Entered By: Levan Hurst on 01/20/2021 15:28:58 -------------------------------------------------------------------------------- Pain Assessment Details Patient Name: Date of Service: [name], Joseph BERT A. 01/20/2021 2:45 PM Medical Record Number: [id number] Patient Account Number: [id number] Date of Birth/Sex: Treating RN: [birth year] (87 y.o. [name] Primary Care Vieva Brummitt: [name] Mayline Dragon: Treating Azrael Huss/Extender: Bebe Shaggy in Treatment: 4 Active Problems Location of Pain Severity and Description of Pain Patient Has Paino No Site Locations Pain Management and Medication Current Pain Management: Electronic Signature(s) Signed: 01/20/2021 5:50:50 PM By: Levan Hurst RN, BSN Signed: 01/27/2021 7:57:36 AM By: Sandre Kitty Entered By: Sandre Kitty on 01/20/2021 14:57:29 -------------------------------------------------------------------------------- Patient/Caregiver Education Details Patient Name: Date of Service: [name], [place] 2/14/2022andnbsp2:45 PM Medical Record Number: [id number] Patient Account Number: [id number] Date of Birth/Gender: Treating RN: Jan 05, 1932 (87 y.o. [name] Primary Care Physician: [name] Physician: Treating Physician/Extender: Bebe Shaggy in Treatment: 4 Education Assessment Education Provided To: Patient Education Topics Provided Wound/Skin Impairment: Methods: Explain/Verbal Responses: State content correctly Electronic  Signature(s) Signed: 01/20/2021 5:50:50 PM By: Levan Hurst RN, BSN Entered By: Levan Hurst on 01/20/2021 15:29:13 -------------------------------------------------------------------------------- Wound Assessment Details Patient Name: Date of Service: [name], Joseph BERT A. 01/20/2021 2:45 PM Medical Record Number: [id number] Patient Account Number: [id number] Date of Birth/Sex: Treating RN: [birth year] (87 y.o. [name], [name] Primary Care Tonnia Bardin: [name] Dezaray Shibuya: Treating Katesha Eichel/Extender: Bebe Shaggy in Treatment: 4 Wound Status Wound Number: 1 Primary Venous Leg Ulcer Etiology: Wound Location: Right, Anterior Lower Leg Wound Open Wounding Event: Gradually Appeared Status: Date Acquired: 10/07/2020 Comorbid Chronic Obstructive Pulmonary Disease (COPD), Sleep Apnea, Weeks Of Treatment: 4 History: Arrhythmia, Congestive Heart Failure, Hypertension, Myocardial Clustered Wound: Yes Infarction, Peripheral Venous Disease, Type II Diabetes Photos Photo Uploaded By: Mikeal Hawthorne on 01/23/2021 13:39:50 Wound Measurements Length: (cm) 5.7 Width: (cm) 2.8 Depth: (cm) 0.1 Clustered Quantity: 2 Area: (cm) 12.535 Volume: (cm) 1.253 % Reduction in Area: 79.3% % Reduction in Volume: 79.3% Epithelialization: Large (67-100%) Tunneling: No Undermining: No Wound Description Classification: Full Thickness Without Exposed Support Structures Wound Margin: Flat and Intact Exudate Amount: Large Exudate Type: Serosanguineous Exudate Color: red, brown Foul Odor After Cleansing: No Slough/Fibrino No Wound Bed Granulation Amount: Large (67-100%) Exposed Structure Granulation Quality: Red Fascia Exposed: No Necrotic Amount: None Present (0%) Fat Layer (Subcutaneous Tissue) Exposed: No Tendon Exposed: No Muscle Exposed: No Joint Exposed: No Bone Exposed: No Treatment Notes Wound #1 (Lower Leg) Wound Laterality: Right,  Anterior Cleanser Soap and Water Discharge Instruction: Home Health to wash leg with soap and water with dressing changes. May shower and wash wound with dial antibacterial soap and water prior to dressing change. Peri-Wound Care Zinc Oxide Ointment 30g tube Discharge Instruction: Apply Zinc Oxide as needed for maceration, wetness to periwound with each dressing change Sween Lotion (Moisturizing lotion) Discharge Instruction: home health to Apply  moisturizing lotion as directed Topical Triamcinolone Discharge Instruction: Apply thin layer of Triamcinolone on wound bed under alginate Primary Dressing KerraCel Ag Gelling Fiber Dressing, 4x5 in (silver alginate) Discharge Instruction: Apply silver alginate to wound bed as instructed Secondary Dressing Woven Gauze Sponge, Non-Sterile 4x4 in Discharge Instruction: Apply over primary dressing as directed. ABD Pad, 8x10 Discharge Instruction: Apply over primary dressing as directed. Secured With Compression Wrap ThreePress (3 layer compression wrap) Discharge Instruction: Apply three layer compression as directed. Apply just above toes and just below the knee (tibial tuberosity). Compression Stockings Jobst Farrow Wrap 4000 Quantity: 1 Left Leg Compression Amount: 30-40 mmHg Right Leg Compression Amount: 30-40 mmHg Discharge Instruction: Apply Francia Greaves daily as instructed. Apply first thing in the morning, remove at night before bed. Add-Ons Electronic Signature(s) Signed: 01/20/2021 5:09:26 PM By: Rhae Hammock RN Entered By: Rhae Hammock on 01/20/2021 15:01:52 -------------------------------------------------------------------------------- Vitals Details Patient Name: Date of Service: [name], Joseph BERT A. 01/20/2021 2:45 PM Medical Record Number: [id number] Patient Account Number: [id number] Date of Birth/Sex: Treating RN: 03-Apr-1932 (87 y.o. [name] Primary Care Sallie Maker: [name]  Shalah Estelle: Treating Esperanza Madrazo/Extender: Bebe Shaggy in Treatment: 4 Vital Signs Time Taken: 14:57 Temperature (F): 98.3 Height (in): 69 Pulse (bpm): 90 Weight (lbs): 190 Respiratory Rate (breaths/min): 18 Body Mass Index (BMI): 28.1 Blood Pressure (mmHg): 189/82 Capillary Blood Glucose (mg/dl): 95 Reference Range: 80 - 120 mg / dl Electronic Signature(s) Signed: 01/27/2021 7:57:36 AM By: Sandre Kitty Entered By: Sandre Kitty on 01/20/2021 14:57:24

## 2021-01-27 NOTE — Progress Notes (Signed)
[name], [name] ([id number]) Visit Report for 01/27/2021 HPI Details Patient Name: Date of Service: [place], [name] 01/27/2021 3:30 PM Medical Record Number: [id number] Patient Account Number: [id number] Date of Birth/Sex: Treating RN: 10-04-1932 (87 y.o. [name] Primary Care Provider: [name] Provider: Treating Provider/Extender: Bebe Shaggy in Treatment: 5 History of Present Illness HPI Description: ADMISSION 12/19/2020 This is a 87 year old man who arrives accompanied by his son who used to be a patient in this clinic. He has had chronic wounds on the right anterior lower leg which have been opening and closing on and off for the last 7 to 8 months. He says these will blister then opened up then closed and blister opened up and closed. He has [facility] and I think they have been applying alginate and 3 layer wraps however apparently the 3 layer light wrap was not put on properly. He is also had compression stockings but does not feel he can get these on. Past medical history includes COPD on chronic oxygen, pulmonary hypertension, obstructive sleep apnea, type 2 diabetes, diastolic heart failure, chronic kidney disease stage III and atrial fibrillation on Eliquis ABI in our clinic was 1.04 on the right 01/13/2021 patient admitted to the clinic over 3 weeks ago. Since he was admitted here he was hospitalized from 12/26/2020 through 01/02/2021 with Covid pneumonia. He improved with treatment for bilateral infiltrates. Discharged on 3 L of nasal cannula. He was also felt to have acute on chronic diastolic heart failure. Does not look like they did anything particular with the wounds on the right anterior lower leg. The patient has a large area on the right anterior lower leg that is oval-shaped and de-pigmented. He has several small open areas scattered throughout the surface of this. This does not look too much different from  last time. It is difficult to get a history of him exactly how and when this happened he says it actually happened during the recent hospitalization however he certainly had this before that because we saw him 1 time in the clinic. He did has had a vein stripping in this area done remotely but I cannot really see the issue or the connection here. He does not describe a lot of pain 2/14; patient arrives in clinic today with a much better looking area on the right anterior lower leg. Only a few small open areas remaining what was a fully de- epithelialized area. I must say I had some concerns about this being a primary cutaneous problem when I first saw this with the major differential being chronic stasis dermatitis. He arrives in clinic today with the area almost completely epithelialized. I feel better about the idea of not biopsying this. He is going to need compression stockings and we spent some time talking about this perhaps [place] wraps. He has lymphedema in this area is certainly will not maintain skin integrity if the edema is not controlled. Surface is very adherent to underlying subcutaneous tissue 2/21; he comes in today with a disappointing deterioration in this area. He has an oval-shaped hypopigmented area with multiple small areas of skin breakdown. I had some thoughts about the etiology of this when he first came in here however it looked as though this was closing with standard care for venous disease/stasis dermatitis. Certainly stasis dermatitis could cause pigmentary changes. The patient says that people have been working on this for many months and that it will close and then  reopen. The patient states that this will often open with blisters although I have not seen anything that looked like this there is no evidence of infection Electronic Signature(s) Signed: 01/27/2021 5:55:48 PM By: Linton Ham MD Entered By: Linton Ham on 01/27/2021  17:36:23 -------------------------------------------------------------------------------- Physical Exam Details Patient Name: Date of Service: [name], Joseph BERT A. 01/27/2021 3:30 PM Medical Record Number: [id number] Patient Account Number: [id number] Date of Birth/Sex: Treating RN: February 07, 1932 (87 y.o. [name] Primary Care Provider: [name] Provider: Treating Provider/Extender: Bebe Shaggy in Treatment: 5 Constitutional Patient is hypertensive.. Pulse regular and within target range for patient.Marland Kitchen Respirations regular, non-labored and within target range.. Temperature is normal and within the target range for the patient.Marland Kitchen Appears in no distress. Cardiovascular No pulses are palpable. Edema control is good. Notes Wound exam; the patient has a large area in the right anterior tibia. He it is is oval-shaped with again some loss of pigmentation. At once again today he has almost innumerable small open areas throughout this area there is no scabbing, no tenderness no warmth no obvious infection Electronic Signature(s) Signed: 01/27/2021 5:55:48 PM By: Linton Ham MD Entered By: Linton Ham on 01/27/2021 17:37:40 -------------------------------------------------------------------------------- Physician Orders Details Patient Name: Date of Service: [name], Joseph BERT A. 01/27/2021 3:30 PM Medical Record Number: [id number] Patient Account Number: [id number] Date of Birth/Sex: Treating RN: Jul 14, 1932 (87 y.o. [name] Primary Care Provider: [name] Provider: Treating Provider/Extender: Bebe Shaggy in Treatment: 5 Verbal / Phone Orders: No Diagnosis Coding ICD-10 Coding Code Description I89.0 Lymphedema, not elsewhere classified I87.331 Chronic venous hypertension (idiopathic) with ulcer and inflammation of right lower extremity L97.811 Non-pressure chronic ulcer of other  part of right lower leg limited to breakdown of skin Follow-up Appointments Return Appointment in 1 week. Bathing/ Shower/ Hygiene May shower with protection but do not get wound dressing(s) wet. Edema Control - Lymphedema / SCD / Other Elevate legs to the level of the heart or above for 30 minutes daily and/or when sitting, a frequency of: - throughout the day 3-4 times a day. Avoid standing for long periods of time. Patient to wear own compression stockings every day. - Apply compression stocking to left leg in the morning and remove at night. Apply lotion every night after removing stocking. Exercise regularly [place] wound care orders this week; continue Home Health for wound care. May utilize formulary equivalent dressing for wound treatment orders unless otherwise specified. - Change primary dressing to Hydrofera Blue Dressing changes to be completed by [place] on Monday / Wednesday / Friday except when patient has scheduled visit at [place] clinic will change on Mondays Other [place] Orders/Instructions: - [facility] twice a week Wound Treatment Wound #1 - Lower Leg Wound Laterality: Right, Anterior Cleanser: Soap and Water ([place]) 3 x Per Week/30 Days Discharge Instructions: Home Health to wash leg with soap and water with dressing changes. May shower and wash wound with dial antibacterial soap and water prior to dressing change. Peri-Wound Care: Zinc Oxide Ointment 30g tube (Home Health) 3 x Per Week/30 Days Discharge Instructions: Apply Zinc Oxide as needed for maceration, wetness to periwound with each dressing change Peri-Wound Care: Sween Lotion (Moisturizing lotion) (Home Health) 3 x Per Week/30 Days Discharge Instructions: home health to Apply moisturizing lotion as directed Topical: Triamcinolone ([place]) 3 x Per Week/30 Days Discharge Instructions: Apply thin layer of Triamcinolone on  wound bed under alginate Prim Dressing:  Hydrofera Blue Ready Foam, 4x5 in (Home Health) 3 x Per Week/30 Days ary Discharge Instructions: Apply to wound bed as instructed Secondary Dressing: Woven Gauze Sponge, Non-Sterile 4x4 in (Home Health) 3 x Per Week/30 Days Discharge Instructions: Apply over primary dressing as directed. Secondary Dressing: ABD Pad, 8x10 (Home Health) 3 x Per Week/30 Days Discharge Instructions: Apply over primary dressing as directed. Compression Wrap: ThreePress (3 layer compression wrap) (Home Health) 3 x Per Week/30 Days Discharge Instructions: Apply three layer compression as directed. Apply just above toes and just below the knee (tibial tuberosity). Electronic Signature(s) Signed: 01/27/2021 5:41:15 PM By: Levan Hurst RN, BSN Signed: 01/27/2021 5:55:48 PM By: Linton Ham MD Entered By: Levan Hurst on 01/27/2021 16:30:21 -------------------------------------------------------------------------------- Problem List Details Patient Name: Date of Service: [place], [place] BERT A. 01/27/2021 3:30 PM Medical Record Number: [id number] Patient Account Number: [id number] Date of Birth/Sex: Treating RN: Dec 16, 1931 (87 y.o. [name] Primary Care Provider: [name] Provider: Treating Provider/Extender: Bebe Shaggy in Treatment: 5 Active Problems ICD-10 Encounter Code Description Active Date MDM Diagnosis I89.0 Lymphedema, not elsewhere classified 12/19/2020 No Yes I87.331 Chronic venous hypertension (idiopathic) with ulcer and inflammation of right 12/19/2020 No Yes lower extremity L97.811 Non-pressure chronic ulcer of other part of right lower leg limited to breakdown 12/19/2020 No Yes of skin Inactive Problems Resolved Problems Electronic Signature(s) Signed: 01/27/2021 5:55:48 PM By: Linton Ham MD Entered By: Linton Ham on 01/27/2021 17:33:09 -------------------------------------------------------------------------------- Progress  Note Details Patient Name: Date of Service: [name], Joseph BERT A. 01/27/2021 3:30 PM Medical Record Number: [id number] Patient Account Number: [id number] Date of Birth/Sex: Treating RN: Sep 09, 1932 (87 y.o. [name] Primary Care Provider: [name] Provider: Treating Provider/Extender: Bebe Shaggy in Treatment: 5 Subjective History of Present Illness (HPI) ADMISSION 12/19/2020 This is a 87 year old man who arrives accompanied by his son who used to be a patient in this clinic. He has had chronic wounds on the right anterior lower leg which have been opening and closing on and off for the last 7 to 8 months. He says these will blister then opened up then closed and blister opened up and closed. He has [facility] and I think they have been applying alginate and 3 layer wraps however apparently the 3 layer light wrap was not put on properly. He is also had compression stockings but does not feel he can get these on. Past medical history includes COPD on chronic oxygen, pulmonary hypertension, obstructive sleep apnea, type 2 diabetes, diastolic heart failure, chronic kidney disease stage III and atrial fibrillation on Eliquis ABI in our clinic was 1.04 on the right 01/13/2021 patient admitted to the clinic over 3 weeks ago. Since he was admitted here he was hospitalized from 12/26/2020 through 01/02/2021 with Covid pneumonia. He improved with treatment for bilateral infiltrates. Discharged on 3 L of nasal cannula. He was also felt to have acute on chronic diastolic heart failure. Does not look like they did anything particular with the wounds on the right anterior lower leg. The patient has a large area on the right anterior lower leg that is oval-shaped and de-pigmented. He has several small open areas scattered throughout the surface of this. This does not look too much different from last time. It is difficult to get a history of him  exactly how and when this happened he says it actually happened during the recent  hospitalization however he certainly had this before that because we saw him 1 time in the clinic. He did has had a vein stripping in this area done remotely but I cannot really see the issue or the connection here. He does not describe a lot of pain 2/14; patient arrives in clinic today with a much better looking area on the right anterior lower leg. Only a few small open areas remaining what was a fully de- epithelialized area. I must say I had some concerns about this being a primary cutaneous problem when I first saw this with the major differential being chronic stasis dermatitis. He arrives in clinic today with the area almost completely epithelialized. I feel better about the idea of not biopsying this. He is going to need compression stockings and we spent some time talking about this perhaps [place] wraps. He has lymphedema in this area is certainly will not maintain skin integrity if the edema is not controlled. Surface is very adherent to underlying subcutaneous tissue 2/21; he comes in today with a disappointing deterioration in this area. He has an oval-shaped hypopigmented area with multiple small areas of skin breakdown. I had some thoughts about the etiology of this when he first came in here however it looked as though this was closing with standard care for venous disease/stasis dermatitis. Certainly stasis dermatitis could cause pigmentary changes. The patient says that people have been working on this for many months and that it will close and then reopen. The patient states that this will often open with blisters although I have not seen anything that looked like this there is no evidence of infection Objective Constitutional Patient is hypertensive.. Pulse regular and within target range for patient.Marland Kitchen Respirations regular, non-labored and within target range.. Temperature is normal and within the  target range for the patient.Marland Kitchen Appears in no distress. Vitals Time Taken: 4:15 PM, Height: 69 in, Weight: 190 lbs, BMI: 28.1, Temperature: 98.3 F, Pulse: 96 bpm, Respiratory Rate: 20 breaths/min, Blood Pressure: 176/95 mmHg, Capillary Blood Glucose: 96 mg/dl. Cardiovascular No pulses are palpable. Edema control is good. General Notes: Wound exam; the patient has a large area in the right anterior tibia. He it is is oval-shaped with again some loss of pigmentation. At once again today he has almost innumerable small open areas throughout this area there is no scabbing, no tenderness no warmth no obvious infection Integumentary (Hair, Skin) Wound #1 status is Open. Original cause of wound was Gradually Appeared. The date acquired was: 10/07/2020. The wound has been in treatment 5 weeks. The wound is located on the Right,Anterior Lower Leg. The wound measures 14.5cm length x 4cm width x 0.1cm depth; 45.553cm^2 area and 4.555cm^3 volume. There is Fat Layer (Subcutaneous Tissue) exposed. There is no tunneling or undermining noted. There is a medium amount of serosanguineous drainage noted. The wound margin is flat and intact. There is large (67-100%) red granulation within the wound bed. There is no necrotic tissue within the wound bed. Assessment Active Problems ICD-10 Lymphedema, not elsewhere classified Chronic venous hypertension (idiopathic) with ulcer and inflammation of right lower extremity Non-pressure chronic ulcer of other part of right lower leg limited to breakdown of skin Procedures Wound #1 Pre-procedure diagnosis of Wound #1 is a Venous Leg Ulcer located on the Right,Anterior Lower Leg . There was a Three Layer Compression Therapy Procedure by Levan Hurst, RN. Post procedure Diagnosis Wound #1: Same as Pre-Procedure Plan Follow-up Appointments: Return Appointment in 1 week. Bathing/ Shower/ Hygiene: May shower  with protection but do not get wound dressing(s) wet. Edema  Control - Lymphedema / SCD / Other: Elevate legs to the level of the heart or above for 30 minutes daily and/or when sitting, a frequency of: - throughout the day 3-4 times a day. Avoid standing for long periods of time. Patient to wear own compression stockings every day. - Apply compression stocking to left leg in the morning and remove at night. Apply lotion every night after removing stocking. Exercise regularly Home Health: New wound care orders this week; continue Home Health for wound care. May utilize formulary equivalent dressing for wound treatment orders unless otherwise specified. - Change primary dressing to Hydrofera Blue Dressing changes to be completed by [place] on Monday / Wednesday / Friday except when patient has scheduled visit at [place] clinic will change on Mondays Other [place] Orders/Instructions: - [facility] twice a week WOUND #1: - Lower Leg Wound Laterality: Right, Anterior Cleanser: Soap and Water ([place]) 3 x Per Week/30 Days Discharge Instructions: Home Health to wash leg with soap and water with dressing changes. May shower and wash wound with dial antibacterial soap and water prior to dressing change. Peri-Wound Care: Zinc Oxide Ointment 30g tube (Home Health) 3 x Per Week/30 Days Discharge Instructions: Apply Zinc Oxide as needed for maceration, wetness to periwound with each dressing change Peri-Wound Care: Sween Lotion (Moisturizing lotion) (Home Health) 3 x Per Week/30 Days Discharge Instructions: home health to Apply moisturizing lotion as directed Topical: Triamcinolone ([place]) 3 x Per Week/30 Days Discharge Instructions: Apply thin layer of Triamcinolone on wound bed under alginate Prim Dressing: Hydrofera Blue Ready Foam, 4x5 in (Home Health) 3 x Per Week/30 Days ary Discharge Instructions: Apply to wound bed as instructed Secondary Dressing: Woven Gauze Sponge, Non-Sterile 4x4 in (Home Health) 3 x Per Week/30  Days Discharge Instructions: Apply over primary dressing as directed. Secondary Dressing: ABD Pad, 8x10 (Home Health) 3 x Per Week/30 Days Discharge Instructions: Apply over primary dressing as directed. Com pression Wrap: ThreePress (3 layer compression wrap) (Home Health) 3 x Per Week/30 Days Discharge Instructions: Apply three layer compression as directed. Apply just above toes and just below the knee (tibial tuberosity). 1. I change the primary dressing to [facility] Blue continued the TCA 2. I had some thoughts when he first came in here about perhaps some form of skin issue although when he responded to compression and steroids I felt fairly confident that this was stasis dermatitis. 3. I am still going to keep him in compression Electronic Signature(s) Signed: 01/27/2021 5:55:48 PM By: Linton Ham MD Entered By: Linton Ham on 01/27/2021 17:39:01 -------------------------------------------------------------------------------- SuperBill Details Patient Name: Date of Service: [name], Joseph BERT A. 01/27/2021 Medical Record Number: [id number] Patient Account Number: [id number] Date of Birth/Sex: Treating RN: 11/04/1932 (87 y.o. [name] Primary Care Provider: [name] Provider: Treating Provider/Extender: Bebe Shaggy in Treatment: 5 Diagnosis Coding ICD-10 Codes Code Description I89.0 Lymphedema, not elsewhere classified I87.331 Chronic venous hypertension (idiopathic) with ulcer and inflammation of right lower extremity L97.811 Non-pressure chronic ulcer of other part of right lower leg limited to breakdown of skin Facility Procedures CPT4 Code: [id number] Description: (Facility Use Only) [phone] - APPLY MULTLAY COMPRS LWR RT LEG Modifier: Quantity: 1 Physician Procedures : CPT4 Code Description Modifier [id number] - WC PHYS LEVEL 3 - EST PT ICD-10 Diagnosis Description I89.0 Lymphedema, not elsewhere classified  I87.331 Chronic venous hypertension (idiopathic) with  ulcer and inflammation of right lower extremity  L97.811 Non-pressure chronic ulcer of other part of right lower leg limited to breakdown of skin Quantity: 1 Electronic Signature(s) Signed: 01/27/2021 5:55:48 PM By: Linton Ham MD Entered By: Linton Ham on 01/27/2021 17:39:20

## 2021-01-27 NOTE — Progress Notes (Signed)
LAW, CORSINO ([id number]) Visit Report for 01/27/2021 Arrival Information Details Patient Name: Date of Service: [place], [name] 01/27/2021 3:30 PM Medical Record Number: [id number] Patient Account Number: [id number] Date of Birth/Sex: Treating RN: 1932/03/15 (87 y.o. Lorette Ang, Meta.Reding Primary Care Hannahgrace Lalli: [name] Melaney Tellefsen: Treating Mylisa Brunson/Extender: Bebe Shaggy in Treatment: 5 Visit Information History Since Last Visit Added or deleted any medications: No Patient Arrived: Ambulatory Any new allergies or adverse reactions: No Arrival Time: 16:10 Had a fall or experienced change in No Accompanied By: family activities of daily living that may affect Transfer Assistance: None risk of falls: Patient Identification Verified: Yes Signs or symptoms of abuse/neglect since last visito No Secondary Verification Process Completed: Yes Hospitalized since last visit: No Patient Requires Transmission-Based Precautions: No Implantable device outside of the clinic excluding No Patient Has Alerts: Yes cellular tissue based products placed in the center Patient Alerts: Patient on Blood Thinner since last visit: Has Dressing in Place as Prescribed: Yes Has Compression in Place as Prescribed: Yes Pain Present Now: No Electronic Signature(s) Signed: 01/27/2021 6:14:41 PM By: [name] Entered By: [name] on 01/27/2021 16:15:21 -------------------------------------------------------------------------------- Compression Therapy Details Patient Name: Date of Service: [name], [name] 01/27/2021 3:30 PM Medical Record Number: [id number] Patient Account Number: [id number] Date of Birth/Sex: Treating RN: 02-14-1932 (87 y.o. [name] Primary Care Takasha Vetere: [name] Lamerle Jabs: Treating Kandace Elrod/Extender: Bebe Shaggy in Treatment: 5 Compression Therapy Performed for Wound  Assessment: Wound #1 Right,Anterior Lower Leg Performed By: Clinician Levan Hurst, RN Compression Type: Three Layer Post Procedure Diagnosis Same as Pre-procedure Electronic Signature(s) Signed: 01/27/2021 5:41:15 PM By: Levan Hurst RN, BSN Entered By: Levan Hurst on 01/27/2021 16:31:11 -------------------------------------------------------------------------------- Encounter Discharge Information Details Patient Name: Date of Service: [name], [name] 01/27/2021 3:30 PM Medical Record Number: [id number] Patient Account Number: [id number] Date of Birth/Sex: Treating RN: 1931-12-16 (87 y.o. [name] Primary Care Antwione Picotte: [name] Edgar Reisz: Treating Sherice Ijames/Extender: Bebe Shaggy in Treatment: 5 Encounter Discharge Information Items Discharge Condition: Stable Ambulatory Status: Ambulatory Discharge Destination: Home Transportation: Private Auto Accompanied By: self Schedule Follow-up Appointment: Yes Clinical Summary of Care: Electronic Signature(s) Signed: 01/27/2021 6:14:41 PM By: [name] Entered By: [name] on 01/27/2021 17:54:39 -------------------------------------------------------------------------------- Lower Extremity Assessment Details Patient Name: Date of Service: [place], [name] 01/27/2021 3:30 PM Medical Record Number: [id number] Patient Account Number: [id number] Date of Birth/Sex: Treating RN: [birth year] (87 y.o. [name] Primary Care Armanie Martine: [name] Stephan Nelis: Treating Ruford Dudzinski/Extender: Bebe Shaggy in Treatment: 5 Edema Assessment Assessed: [name]: No] [name]: Yes] Edema: [Left: Ye] [Right: s] Calf Left: Right: Point of Measurement: From Medial Instep 29 cm Ankle Left: Right: Point of Measurement: From Medial Instep 23 cm Vascular Assessment Pulses: Dorsalis Pedis Palpable: [Right:Yes] Electronic  Signature(s) Signed: 01/27/2021 6:14:41 PM By: [name] Entered By: [name] on 01/27/2021 16:19:17 -------------------------------------------------------------------------------- Multi Wound Chart Details Patient Name: Date of Service: [name], [name] 01/27/2021 3:30 PM Medical Record Number: [id number] Patient Account Number: [id number] Date of Birth/Sex: Treating RN: [birth year] (87 y.o. [name] Primary Care Lynnette Pote: [name] Geanine Vandekamp: Treating Irania Durell/Extender: Bebe Shaggy in Treatment: 5 Vital Signs Height(in): 69 Capillary Blood Glucose(mg/dl): 96 Weight(lbs): 190 Pulse(bpm): 96 Body Mass Index(BMI): 28 Blood Pressure(mmHg): 176/95 Temperature(F): 98.3 Respiratory Rate(breaths/min): 20 Photos: [1:No Photos Right,  Anterior Lower Leg] [N/A:N/A N/A] Wound Location: [1:Gradually Appeared] [N/A:N/A] Wounding Event: [1:Venous Leg Ulcer] [N/A:N/A] Primary Etiology: [1:Chronic Obstructive Pulmonary] [N/A:N/A] Comorbid History: [1:Disease (COPD), Sleep Apnea, Arrhythmia, Congestive Heart Failure, Hypertension, Myocardial Infarction, Peripheral Venous Disease, Type II Diabetes 10/07/2020] [N/A:N/A] Date Acquired: [1:5] [N/A:N/A] Weeks of Treatment: [1:Open] [N/A:N/A] Wound Status: [1:Yes] [N/A:N/A] Clustered Wound: [1:10] [N/A:N/A] Clustered Quantity: [1:14.5x4x0.1] [N/A:N/A] Measurements L x W x D (cm) [1:45.553] [N/A:N/A] A (cm) : rea [1:4.555] [N/A:N/A] Volume (cm) : [1:24.70%] [N/A:N/A] % Reduction in Area: [1:24.70%] [N/A:N/A] % Reduction in Volume: [1:Full Thickness Without Exposed] [N/A:N/A] Classification: [1:Support Structures Medium] [N/A:N/A] Exudate Amount: [1:Serosanguineous] [N/A:N/A] Exudate Type: [1:red, brown] [N/A:N/A] Exudate Color: [1:Flat and Intact] [N/A:N/A] Wound Margin: [1:Large (67-100%)] [N/A:N/A] Granulation Amount: [1:Red] [N/A:N/A] Granulation Quality: [1:None Present  (0%)] [N/A:N/A] Necrotic Amount: [1:Fat Layer (Subcutaneous Tissue): Yes N/A] Exposed Structures: [1:Fascia: No Tendon: No Muscle: No Joint: No Bone: No Medium (34-66%)] [N/A:N/A] Epithelialization: [1:Compression Therapy] [N/A:N/A] Treatment Notes Electronic Signature(s) Signed: 01/27/2021 5:41:15 PM By: Levan Hurst RN, BSN Signed: 01/27/2021 5:55:48 PM By: Linton Ham MD Entered By: Linton Ham on 01/27/2021 17:33:30 -------------------------------------------------------------------------------- Multi-Disciplinary Care Plan Details Patient Name: Date of Service: [place], [place] BERT A. 01/27/2021 3:30 PM Medical Record Number: [id number] Patient Account Number: [id number] Date of Birth/Sex: Treating RN: 08-16-1932 (87 y.o. [name] Primary Care Elliot Meldrum: [name] Jaida Basurto: Treating Kahliya Fraleigh/Extender: Bebe Shaggy in Treatment: 5 Active Inactive Abuse / Safety / Falls / Self Care Management Nursing Diagnoses: Potential for falls Goals: Patient will remain injury free related to falls Date Initiated: 12/19/2020 Target Resolution Date: 02/21/2021 Goal Status: Active Interventions: Assess Activities of Daily Living upon admission and as needed Assess fall risk on admission and as needed Notes: Nutrition Nursing Diagnoses: Potential for alteratiion in Nutrition/Potential for imbalanced nutrition Goals: Patient/caregiver agrees to and verbalizes understanding of need to obtain nutritional consultation Date Initiated: 12/19/2020 Date Inactivated: 01/13/2021 Target Resolution Date: 01/10/2021 Goal Status: Met Patient/caregiver will maintain therapeutic glucose control Date Initiated: 12/19/2020 Target Resolution Date: 02/21/2021 Goal Status: Active Interventions: Assess HgA1c results as ordered upon admission and as needed Provide education on elevated blood sugars and impact on wound healing Provide education on  nutrition Treatment Activities: Education provided on Nutrition : 01/13/2021 Obtain HgA1c : 12/19/2020 Notes: Wound/Skin Impairment Nursing Diagnoses: Knowledge deficit related to ulceration/compromised skin integrity Goals: Patient/caregiver will verbalize understanding of skin care regimen Date Initiated: 12/19/2020 Target Resolution Date: 02/21/2021 Goal Status: Active Interventions: Assess patient/caregiver ability to obtain necessary supplies Assess patient/caregiver ability to perform ulcer/skin care regimen upon admission and as needed Provide education on ulcer and skin care Treatment Activities: Skin care regimen initiated : 12/19/2020 Topical wound management initiated : 12/19/2020 Notes: Electronic Signature(s) Signed: 01/27/2021 5:41:15 PM By: Levan Hurst RN, BSN Entered By: Levan Hurst on 01/27/2021 17:27:10 -------------------------------------------------------------------------------- Pain Assessment Details Patient Name: Date of Service: [name], [name] 01/27/2021 3:30 PM Medical Record Number: [id number] Patient Account Number: [id number] Date of Birth/Sex: Treating RN: Jul 27, 1932 (87 y.o. [name] Primary Care Terance Pomplun: [name] Tanise Russman: Treating Neftali Abair/Extender: Bebe Shaggy in Treatment: 5 Active Problems Location of Pain Severity and Description of Pain Patient Has Paino No Site Locations Rate the pain. Current Pain Level: 0 Pain Management and Medication Current Pain Management: Medication: No Cold Application: No Rest: No Massage: No Activity: No T.E.N.S.: No Heat Application: No Leg drop or elevation: No Is the Current Pain Management Adequate: Adequate How does  your wound impact your activities of daily livingo Sleep: No Bathing: No Appetite: No Relationship With Others: No Bladder Continence: No Emotions: No Bowel Continence: No Work: No Toileting: No Drive:  No Dressing: No Hobbies: No Electronic Signature(s) Signed: 01/27/2021 6:14:41 PM By: [name] Entered By: [name] on 01/27/2021 16:19:01 -------------------------------------------------------------------------------- Patient/Caregiver Education Details Patient Name: Date of Service: [name], [place]. 2/21/2022andnbsp3:30 PM Medical Record Number: [id number] Patient Account Number: [id number] Date of Birth/Gender: Treating RN: 01-Jul-1932 (87 y.o. [name] Primary Care Physician: [name] Physician: Treating Physician/Extender: Bebe Shaggy in Treatment: 5 Education Assessment Education Provided To: Patient Education Topics Provided Wound/Skin Impairment: Methods: Explain/Verbal Responses: State content correctly Electronic Signature(s) Signed: 01/27/2021 5:41:15 PM By: Levan Hurst RN, BSN Entered By: Levan Hurst on 01/27/2021 17:27:19 -------------------------------------------------------------------------------- Wound Assessment Details Patient Name: Date of Service: [name], [name] 01/27/2021 3:30 PM Medical Record Number: [id number] Patient Account Number: [id number] Date of Birth/Sex: Treating RN: 1932/03/02 (87 y.o. Lorette Ang, Meta.Reding Primary Care Atlee Kluth: [name] Burleigh Brockmann: Treating Matyas Baisley/Extender: Bebe Shaggy in Treatment: 5 Wound Status Wound Number: 1 Primary Venous Leg Ulcer Etiology: Wound Location: Right, Anterior Lower Leg Wound Open Wounding Event: Gradually Appeared Status: Date Acquired: 10/07/2020 Comorbid Chronic Obstructive Pulmonary Disease (COPD), Sleep Apnea, Weeks Of Treatment: 5 History: Arrhythmia, Congestive Heart Failure, Hypertension, Myocardial Clustered Wound: Yes Infarction, Peripheral Venous Disease, Type II Diabetes Wound Measurements Length: (cm) 14.5 Width: (cm) 4 Depth: (cm) 0.1 Clustered Quantity:  10 Area: (cm) 45.553 Volume: (cm) 4.555 % Reduction in Area: 24.7% % Reduction in Volume: 24.7% Epithelialization: Medium (34-66%) Tunneling: No Undermining: No Wound Description Classification: Full Thickness Without Exposed Support Structures Wound Margin: Flat and Intact Exudate Amount: Medium Exudate Type: Serosanguineous Exudate Color: red, brown Foul Odor After Cleansing: No Slough/Fibrino No Wound Bed Granulation Amount: Large (67-100%) Exposed Structure Granulation Quality: Red Fascia Exposed: No Necrotic Amount: None Present (0%) Fat Layer (Subcutaneous Tissue) Exposed: Yes Tendon Exposed: No Muscle Exposed: No Joint Exposed: No Bone Exposed: No Treatment Notes Wound #1 (Lower Leg) Wound Laterality: Right, Anterior Cleanser Soap and Water Discharge Instruction: Home Health to wash leg with soap and water with dressing changes. May shower and wash wound with dial antibacterial soap and water prior to dressing change. Peri-Wound Care Zinc Oxide Ointment 30g tube Discharge Instruction: Apply Zinc Oxide as needed for maceration, wetness to periwound with each dressing change Sween Lotion (Moisturizing lotion) Discharge Instruction: home health to Apply moisturizing lotion as directed Topical Triamcinolone Discharge Instruction: Apply thin layer of Triamcinolone on wound bed under alginate Primary Dressing Hydrofera Blue Ready Foam, 4x5 in Discharge Instruction: Apply to wound bed as instructed Secondary Dressing Woven Gauze Sponge, Non-Sterile 4x4 in Discharge Instruction: Apply over primary dressing as directed. ABD Pad, 8x10 Discharge Instruction: Apply over primary dressing as directed. Secured With Compression Wrap ThreePress (3 layer compression wrap) Discharge Instruction: Apply three layer compression as directed. Apply just above toes and just below the knee (tibial tuberosity). Compression Stockings Add-Ons Electronic Signature(s) Signed:  01/27/2021 6:14:41 PM By: [name] Entered By: [name] on 01/27/2021 16:19:36 -------------------------------------------------------------------------------- Vitals Details Patient Name: Date of Service: [name], [name] 01/27/2021 3:30 PM Medical Record Number: [id number] Patient Account Number: [id number] Date of Birth/Sex: Treating RN: July 29, 1932 (87 y.o. [name] Primary Care Kaitlen Redford: [name] Dixon Luczak: Treating Montoya Brandel/Extender: Bebe Shaggy in Treatment: 5 Vital Signs Time  Taken: 16:15 Temperature (F): 98.3 Height (in): 69 Pulse (bpm): 96 Weight (lbs): 190 Respiratory Rate (breaths/min): 20 Body Mass Index (BMI): 28.1 Blood Pressure (mmHg): 176/95 Capillary Blood Glucose (mg/dl): 96 Reference Range: 80 - 120 mg / dl Electronic Signature(s) Signed: 01/27/2021 6:14:41 PM By: [name] Entered By: [name] on 01/27/2021 16:18:54

## 2021-01-28 DIAGNOSIS — I480 Paroxysmal atrial fibrillation: Secondary | ICD-10-CM | POA: Diagnosis not present

## 2021-01-28 DIAGNOSIS — U071 COVID-19: Secondary | ICD-10-CM | POA: Diagnosis not present

## 2021-01-28 DIAGNOSIS — I5033 Acute on chronic diastolic (congestive) heart failure: Secondary | ICD-10-CM | POA: Diagnosis not present

## 2021-01-28 DIAGNOSIS — J9621 Acute and chronic respiratory failure with hypoxia: Secondary | ICD-10-CM | POA: Diagnosis not present

## 2021-01-28 DIAGNOSIS — I130 Hypertensive heart and chronic kidney disease with heart failure and stage 1 through stage 4 chronic kidney disease, or unspecified chronic kidney disease: Secondary | ICD-10-CM | POA: Diagnosis not present

## 2021-01-28 DIAGNOSIS — E1122 Type 2 diabetes mellitus with diabetic chronic kidney disease: Secondary | ICD-10-CM | POA: Diagnosis not present

## 2021-01-29 DIAGNOSIS — I480 Paroxysmal atrial fibrillation: Secondary | ICD-10-CM | POA: Diagnosis not present

## 2021-01-29 DIAGNOSIS — J9621 Acute and chronic respiratory failure with hypoxia: Secondary | ICD-10-CM | POA: Diagnosis not present

## 2021-01-29 DIAGNOSIS — U071 COVID-19: Secondary | ICD-10-CM | POA: Diagnosis not present

## 2021-01-29 DIAGNOSIS — I130 Hypertensive heart and chronic kidney disease with heart failure and stage 1 through stage 4 chronic kidney disease, or unspecified chronic kidney disease: Secondary | ICD-10-CM | POA: Diagnosis not present

## 2021-01-29 DIAGNOSIS — I5033 Acute on chronic diastolic (congestive) heart failure: Secondary | ICD-10-CM | POA: Diagnosis not present

## 2021-01-29 DIAGNOSIS — E1122 Type 2 diabetes mellitus with diabetic chronic kidney disease: Secondary | ICD-10-CM | POA: Diagnosis not present

## 2021-01-31 DIAGNOSIS — U071 COVID-19: Secondary | ICD-10-CM | POA: Diagnosis not present

## 2021-01-31 DIAGNOSIS — I480 Paroxysmal atrial fibrillation: Secondary | ICD-10-CM | POA: Diagnosis not present

## 2021-01-31 DIAGNOSIS — E1122 Type 2 diabetes mellitus with diabetic chronic kidney disease: Secondary | ICD-10-CM | POA: Diagnosis not present

## 2021-01-31 DIAGNOSIS — I130 Hypertensive heart and chronic kidney disease with heart failure and stage 1 through stage 4 chronic kidney disease, or unspecified chronic kidney disease: Secondary | ICD-10-CM | POA: Diagnosis not present

## 2021-01-31 DIAGNOSIS — J9621 Acute and chronic respiratory failure with hypoxia: Secondary | ICD-10-CM | POA: Diagnosis not present

## 2021-01-31 DIAGNOSIS — I5033 Acute on chronic diastolic (congestive) heart failure: Secondary | ICD-10-CM | POA: Diagnosis not present

## 2021-02-03 ENCOUNTER — Encounter (HOSPITAL_BASED_OUTPATIENT_CLINIC_OR_DEPARTMENT_OTHER): Payer: Medicare Other | Admitting: Internal Medicine

## 2021-02-03 DIAGNOSIS — E119 Type 2 diabetes mellitus without complications: Secondary | ICD-10-CM | POA: Diagnosis not present

## 2021-02-03 DIAGNOSIS — J9611 Chronic respiratory failure with hypoxia: Secondary | ICD-10-CM | POA: Diagnosis not present

## 2021-02-03 DIAGNOSIS — I5033 Acute on chronic diastolic (congestive) heart failure: Secondary | ICD-10-CM | POA: Diagnosis not present

## 2021-02-04 ENCOUNTER — Other Ambulatory Visit: Payer: Self-pay | Admitting: *Deleted

## 2021-02-04 DIAGNOSIS — J984 Other disorders of lung: Secondary | ICD-10-CM | POA: Diagnosis not present

## 2021-02-04 DIAGNOSIS — I5033 Acute on chronic diastolic (congestive) heart failure: Secondary | ICD-10-CM | POA: Diagnosis not present

## 2021-02-04 NOTE — Patient Outreach (Signed)
[name]) Care Management  [place]  02/04/2021   [name] 06/30/1932 [id number]   Telephone Assessment   Subjective:  Successful outreach call to patient, he reports that he is doing okay just a little short winded. He discussed visit from nurse that was concerned regarding not being able to keep his oxygen level up where it needed to be  and she has ordered a Xray at his home on today. He states that he has a lot going on at his home, and being glad  they can check him out at home and maybe he want have to go to the hospital.Discussed remote health home visit program in managing chronic condition. He is unable to report his recent weight, still has swelling in lower legs.  Patient discussed missed appointment at wound center yesterday due to having difficulty getting portable oxygen tank gauge to turn on , he reports having visit from agency to look at tank on today and wound center appointment rescheduled.  Patient reports blood sugar 70 this am states he drank juice and ate breakfast hasn't rechecked it yet.Reviewed managing hypoglycemia rechecking blood sugar after treating episode . Patient denies daily episodes of low blood sugar, and discussed importance of notifying provider of increase in episodes.  Patient limited time on call due to arrival of agency to chest xray.  He denies any other concerns at this time.     Encounter Medications:  Outpatient Encounter Medications as of 02/04/2021  Medication Sig  . acetaminophen (TYLENOL) 325 MG tablet Take 650 mg by mouth every 6 (six) hours as needed (for pain.).  Marland Kitchen amLODipine (NORVASC) 5 MG tablet Take 5 mg by mouth daily.  Marland Kitchen apixaban (ELIQUIS) 2.5 MG TABS tablet Take 1 tablet (2.5 mg total) by mouth 2 (two) times daily.  . benazepril (LOTENSIN) 40 MG tablet Take 1 tablet (40 mg total) by mouth daily.  . cloNIDine (CATAPRES) 0.1 MG tablet Take 0.1 mg by mouth 2 (two) times daily.  Marland Kitchen dexamethasone (DECADRON)  6 MG tablet Take 1 tablet (6 mg total) by mouth daily.  Marland Kitchen ezetimibe (ZETIA) 10 MG tablet Take 10 mg by mouth daily.  . furosemide (LASIX) 80 MG tablet Take 1 tablet (80 mg total) by mouth 2 (two) times daily.  Marland Kitchen HUMALOG KWIKPEN 100 UNIT/ML KiwkPen Inject 6-8 Units into the skin 3 (three) times daily.   Marland Kitchen icosapent Ethyl (VASCEPA) 1 g capsule TAKE 2 CAPSULES(2 GRAMS) BY MOUTH TWICE DAILY (Patient taking differently: Take 2 g by mouth 2 (two) times daily.)  . LANTUS SOLOSTAR 100 UNIT/ML Solostar Pen Inject 18 Units into the skin every evening.   Marland Kitchen levothyroxine (SYNTHROID) 100 MCG tablet Take 100 mcg by mouth daily before breakfast.  . potassium chloride (KLOR-CON) 10 MEQ tablet Take 1 tablet (10 mEq total) by mouth 2 (two) times daily.  Marland Kitchen Propylene Glycol (SYSTANE BALANCE) 0.6 % SOLN Place 1 drop into both eyes daily as needed (dry eyes).  . rosuvastatin (CRESTOR) 20 MG tablet TAKE 1 TABLET BY MOUTH EVERY DAY (Patient taking differently: Take 20 mg by mouth daily.)   No facility-administered encounter medications on file as of 02/04/2021.    Functional Status:  In your present state of health, do you have any difficulty performing the following activities: 02/04/2021 12/26/2020  Hearing? [place]? Y -  Difficulty concentrating or making decisions? N -  Walking or climbing stairs? Y -  Comment uses walker at times -  Dressing  or bathing? N -  Doing errands, shopping? Y N  Comment family assist -  Conservation officer, nature and eating ? N -  Comment family helps -  Using the Toilet? N -  In the past six months, have you accidently leaked urine? N -  Do you have problems with loss of bowel control? N -  Managing your Medications? N -  Managing your Finances? N -  Comment familly helps -  Housekeeping or managing your Housekeeping? Y -  Comment family helps -  Some recent data might be hidden    Fall/Depression Screening: Fall Risk  01/21/2021 08/19/2020  Falls in the past year? 1 0  Number falls  in past yr: 0 0  Injury with Fall? 0 0  Risk for fall due to : History of fall(s);Impaired mobility Impaired mobility  Follow up Falls evaluation completed;Education provided;Falls prevention discussed Falls evaluation completed;Education provided   [facility] 2/9 Scores 01/21/2021 08/19/2020  PHQ - 2 Score 0 -  Exception Documentation - Other- indicate reason in comment box  Not completed - Unable ask patient requested RNCM complete assessment with wife.    Assessment: Management of chronic conditions, with support of family, remote health and [place] home health.   Goals Addressed            This Visit's Progress   . Make and Keep All Appointments   On track    Follow Up Date 01/21/21 Timeframe:  Long-Range Goal Priority:  Medium Start Date:   10/18/20                          Expected End Date:  05/06/21                      - call to cancel if needed - keep a calendar with appointment dates    Why is this important?   Part of staying healthy is seeing the doctor for follow-up care.  If you forget your appointments, there are some things you can do to stay on track.    Notes:  Reviewed upcoming appointment with Dr. Debara Pickett on 3/10 with date and time. Updated HHRN to remind patient also. Discussed transportation, family support     . Track and Manage My Blood Pressure   Not on track    Follow Up Date 03/06/21 Timeframe:  Long-Range Goal Priority:  Medium Start Date:   10/18/20                          Expected End Date:   05/06/21                    - check blood pressure weekly - write blood pressure results in a log or diary    Why is this important?   You won't feel high blood pressure, but it can still hurt your blood vessels.  High blood pressure can cause heart or kidney problems. It can also cause a stroke.  Making lifestyle changes like losing a little weight or eating less salt will help.  Checking your blood pressure at home and at different times of the day can help  to control blood pressure.  If the doctor prescribes medicine remember to take it the way the doctor ordered.  Call the office if you cannot afford the medicine or if there are questions about it.     Notes:patient not  monitoring , " everybody coming in checks it"    . Track and Manage Symptoms   On track    Follow Up Date 03/06/21 Timeframe:  Long-Range Goal Priority:  High Start Date:    10/18/20                         Expected End Date:  05/06/21                      - know when to call the doctor - track symptoms and what helps feel better or worse  Reinforced notifying remote health regarding worsening symptoms    Why is this important?   You will be able to handle your symptoms better if you keep track of them.  Making some simple changes to your lifestyle will help.  Eating healthy is one thing you can do to take good care of yourself.    Notes:  Reinforced monitoring weights daily, review yellow zone symptoms , and one to contact        Plan:  Follow-up:  Patient agrees to Care Plan and Follow-up.Patient agreeable to follow up call in the next 2 weeks.  Care Coordination call to [place], she discussed concerns regarding patient recent reports of low blood sugar reading at her recent visits.  especially in the mornings, and plans to notify PCP office on today, education on meal intake and having snack at night she reviewed with patient.  Collaboration call to Remote health spoke with Ebony Hail she reports nurse visit on yesterday with labs and planned xray on today.    Joylene Draft, RN, BSN  [place] Management Coordinator  [phone]- Mobile [phone]- Toll Free Main Office

## 2021-02-05 ENCOUNTER — Encounter (HOSPITAL_BASED_OUTPATIENT_CLINIC_OR_DEPARTMENT_OTHER): Payer: PRIVATE HEALTH INSURANCE | Admitting: Physician Assistant

## 2021-02-05 DIAGNOSIS — I130 Hypertensive heart and chronic kidney disease with heart failure and stage 1 through stage 4 chronic kidney disease, or unspecified chronic kidney disease: Secondary | ICD-10-CM | POA: Diagnosis not present

## 2021-02-05 DIAGNOSIS — U071 COVID-19: Secondary | ICD-10-CM | POA: Diagnosis not present

## 2021-02-05 DIAGNOSIS — E1122 Type 2 diabetes mellitus with diabetic chronic kidney disease: Secondary | ICD-10-CM | POA: Diagnosis not present

## 2021-02-05 DIAGNOSIS — I5033 Acute on chronic diastolic (congestive) heart failure: Secondary | ICD-10-CM | POA: Diagnosis not present

## 2021-02-05 DIAGNOSIS — I480 Paroxysmal atrial fibrillation: Secondary | ICD-10-CM | POA: Diagnosis not present

## 2021-02-05 DIAGNOSIS — J9621 Acute and chronic respiratory failure with hypoxia: Secondary | ICD-10-CM | POA: Diagnosis not present

## 2021-02-06 DIAGNOSIS — J9621 Acute and chronic respiratory failure with hypoxia: Secondary | ICD-10-CM | POA: Diagnosis not present

## 2021-02-06 DIAGNOSIS — E1122 Type 2 diabetes mellitus with diabetic chronic kidney disease: Secondary | ICD-10-CM | POA: Diagnosis not present

## 2021-02-06 DIAGNOSIS — I130 Hypertensive heart and chronic kidney disease with heart failure and stage 1 through stage 4 chronic kidney disease, or unspecified chronic kidney disease: Secondary | ICD-10-CM | POA: Diagnosis not present

## 2021-02-06 DIAGNOSIS — I5033 Acute on chronic diastolic (congestive) heart failure: Secondary | ICD-10-CM | POA: Diagnosis not present

## 2021-02-06 DIAGNOSIS — I480 Paroxysmal atrial fibrillation: Secondary | ICD-10-CM | POA: Diagnosis not present

## 2021-02-06 DIAGNOSIS — U071 COVID-19: Secondary | ICD-10-CM | POA: Diagnosis not present

## 2021-02-07 ENCOUNTER — Ambulatory Visit: Payer: PRIVATE HEALTH INSURANCE | Admitting: Adult Health

## 2021-02-07 DIAGNOSIS — I5033 Acute on chronic diastolic (congestive) heart failure: Secondary | ICD-10-CM | POA: Diagnosis not present

## 2021-02-07 DIAGNOSIS — U071 COVID-19: Secondary | ICD-10-CM | POA: Diagnosis not present

## 2021-02-07 DIAGNOSIS — E1122 Type 2 diabetes mellitus with diabetic chronic kidney disease: Secondary | ICD-10-CM | POA: Diagnosis not present

## 2021-02-07 DIAGNOSIS — I130 Hypertensive heart and chronic kidney disease with heart failure and stage 1 through stage 4 chronic kidney disease, or unspecified chronic kidney disease: Secondary | ICD-10-CM | POA: Diagnosis not present

## 2021-02-07 DIAGNOSIS — N189 Chronic kidney disease, unspecified: Secondary | ICD-10-CM | POA: Diagnosis not present

## 2021-02-07 DIAGNOSIS — I480 Paroxysmal atrial fibrillation: Secondary | ICD-10-CM | POA: Diagnosis not present

## 2021-02-07 DIAGNOSIS — J9621 Acute and chronic respiratory failure with hypoxia: Secondary | ICD-10-CM | POA: Diagnosis not present

## 2021-02-10 ENCOUNTER — Encounter (HOSPITAL_BASED_OUTPATIENT_CLINIC_OR_DEPARTMENT_OTHER): Payer: Medicare Other | Attending: Internal Medicine | Admitting: Physician Assistant

## 2021-02-10 ENCOUNTER — Other Ambulatory Visit: Payer: Self-pay

## 2021-02-10 DIAGNOSIS — Z7901 Long term (current) use of anticoagulants: Secondary | ICD-10-CM | POA: Diagnosis not present

## 2021-02-10 DIAGNOSIS — I890 Lymphedema, not elsewhere classified: Secondary | ICD-10-CM | POA: Insufficient documentation

## 2021-02-10 DIAGNOSIS — L97811 Non-pressure chronic ulcer of other part of right lower leg limited to breakdown of skin: Secondary | ICD-10-CM | POA: Diagnosis not present

## 2021-02-10 DIAGNOSIS — N189 Chronic kidney disease, unspecified: Secondary | ICD-10-CM | POA: Diagnosis not present

## 2021-02-10 DIAGNOSIS — I87331 Chronic venous hypertension (idiopathic) with ulcer and inflammation of right lower extremity: Secondary | ICD-10-CM | POA: Diagnosis not present

## 2021-02-10 DIAGNOSIS — L97812 Non-pressure chronic ulcer of other part of right lower leg with fat layer exposed: Secondary | ICD-10-CM | POA: Diagnosis not present

## 2021-02-10 NOTE — Progress Notes (Addendum)
**Note [name]-Identified via Obfuscation** [name], [name] ([id number]) Visit Report for 02/10/2021 Chief Complaint Document Details Patient Name: Date of Service: Joseph [place], [name] 02/10/2021 3:00 PM Medical Record Number: [id number] Patient Account Number: [id number] Date of Birth/Sex: Treating RN: [birth year] (87 y.o. [name] Primary Care Provider: [name] Provider: Treating Provider/Extender: Theadora Rama in Treatment: 7 Information Obtained from: Patient Chief Complaint 12/19/2020; patient is here for review of wounds on his right anterior lower Electronic Signature(s) Signed: 02/10/2021 2:57:27 PM By: Worthy Keeler PA-C Entered By: Worthy Keeler on 02/10/2021 14:57:27 -------------------------------------------------------------------------------- HPI Details Patient Name: Date of Service: [place] [name], Joseph BERT A. 02/10/2021 3:00 PM Medical Record Number: [id number] Patient Account Number: [id number] Date of Birth/Sex: Treating RN: 1932/08/02 (87 y.o. [name] Primary Care Provider: [name] Provider: Treating Provider/Extender: Theadora Rama in Treatment: 7 History of Present Illness HPI Description: ADMISSION 12/19/2020 This is a 87 year old man who arrives accompanied by his son who used to be a patient in this clinic. He has had chronic wounds on the right anterior lower leg which have been opening and closing on and off for the last 7 to 8 months. He says these will blister then opened up then closed and blister opened up and closed. He has [facility] and I think they have been applying alginate and 3 layer wraps however apparently the 3 layer light wrap was not put on properly. He is also had compression stockings but does not feel he can get these on. Past medical history includes COPD on chronic oxygen, pulmonary hypertension, obstructive sleep apnea, type 2 diabetes, diastolic heart failure,  chronic kidney disease stage III and atrial fibrillation on Eliquis ABI in our clinic was 1.04 on the right 01/13/2021 patient admitted to the clinic over 3 weeks ago. Since he was admitted here he was hospitalized from 12/26/2020 through 01/02/2021 with Covid pneumonia. He improved with treatment for bilateral infiltrates. Discharged on 3 L of nasal cannula. He was also felt to have acute on chronic diastolic heart failure. Does not look like they did anything particular with the wounds on the right anterior lower leg. The patient has a large area on the right anterior lower leg that is oval-shaped and [name]-pigmented. He has several small open areas scattered throughout the surface of this. This does not look too much different from last time. It is difficult to get a history of him exactly how and when this happened he says it actually happened during the recent hospitalization however he certainly had this before that because we saw him 1 time in the clinic. He did has had a vein stripping in this area done remotely but I cannot really see the issue or the connection here. He does not describe a lot of pain 2/14; patient arrives in clinic today with a much better looking area on the right anterior lower leg. Only a few small open areas remaining what was a fully [name]- epithelialized area. I must say I had some concerns about this being a primary cutaneous problem when I first saw this with the major differential being chronic stasis dermatitis. He arrives in clinic today with the area almost completely epithelialized. I feel better about the idea of not biopsying this. He is going to need compression stockings and we spent some time talking about this perhaps [place] wraps. He has lymphedema in this area is certainly will not maintain skin integrity  if the edema is not controlled. Surface is very adherent to underlying subcutaneous tissue 2/21; he comes in today with a disappointing deterioration in this  area. He has an oval-shaped hypopigmented area with multiple small areas of skin breakdown. I had some thoughts about the etiology of this when he first came in here however it looked as though this was closing with standard care for venous disease/stasis dermatitis. Certainly stasis dermatitis could cause pigmentary changes. The patient says that people have been working on this for many months and that it will close and then reopen. The patient states that this will often open with blisters although I have not seen anything that looked like this there is no evidence of infection 02/10/2021 upon evaluation today patient appears to be doing about the same in regard to his leg. Unfortunately he is still having a lot of drainage from the right leg. The left leg he has a compression wrap on at this point that is a juxta light. Fortunately there is no signs of active infection currently which is good news. I really feel like he may need stronger compression he apparently did just have some change in his fluid pill to something a little different. Electronic Signature(s) Signed: 02/10/2021 4:44:47 PM By: Worthy Keeler PA-C Entered By: Worthy Keeler on 02/10/2021 16:44:46 -------------------------------------------------------------------------------- Physical Exam Details Patient Name: Date of Service: [name], Joseph BERT A. 02/10/2021 3:00 PM Medical Record Number: [id number] Patient Account Number: [id number] Date of Birth/Sex: Treating RN: [birth year] (87 y.o. [name] Primary Care Provider: [name] Provider: Treating Provider/Extender: Theadora Rama in Treatment: 7 Constitutional Well-nourished and well-hydrated in no acute distress. Respiratory normal breathing without difficulty. Psychiatric this patient is able to make decisions and demonstrates good insight into disease process. Alert and Oriented x 3. pleasant and  cooperative. Notes Upon inspection patient's wound bed actually showed signs of again having several scattered areas of weeping and leaking he has 3+ pitting edema despite being on fluid pills and having a 3 layer compression wrap. I think we may need to increase this to a 4-layer compression wrap to try to help keep some of the edema under better control here and see if that will help resolve some of the swelling and weeping. Electronic Signature(s) Signed: 02/10/2021 4:45:08 PM By: Worthy Keeler PA-C Entered By: Worthy Keeler on 02/10/2021 16:45:08 -------------------------------------------------------------------------------- Physician Orders Details Patient Name: Date of Service: [place] [name], Joseph BERT A. 02/10/2021 3:00 PM Medical Record Number: [id number] Patient Account Number: [id number] Date of Birth/Sex: Treating RN: Jan 06, 1932 (87 y.o. [name] Primary Care Provider: [name] Provider: Treating Provider/Extender: Theadora Rama in Treatment: 7 Verbal / Phone Orders: No Diagnosis Coding ICD-10 Coding Code Description I89.0 Lymphedema, not elsewhere classified I87.331 Chronic venous hypertension (idiopathic) with ulcer and inflammation of right lower extremity L97.811 Non-pressure chronic ulcer of other part of right lower leg limited to breakdown of skin Follow-up Appointments Return Appointment in 1 week. Bathing/ Shower/ Hygiene May shower with protection but do not get wound dressing(s) wet. Edema Control - Lymphedema / SCD / Other Elevate legs to the level of the heart or above for 30 minutes daily and/or when sitting, a frequency of: - throughout the day 3-4 times a day. Avoid standing for long periods of time. Patient to wear own compression stockings every day. - Apply compression stocking to left leg in the morning and remove  at night. Apply lotion every night after removing stocking. Exercise regularly [place] wound care orders this week; continue Home Health for wound care. May utilize formulary equivalent dressing for wound treatment orders unless otherwise specified. - Increase compression to 4 layer Dressing changes to be completed by [place] on Monday / Wednesday / Friday except when patient has scheduled visit at [place] clinic will change on Mondays Other [place] Orders/Instructions: - [facility] twice a week Wound Treatment Wound #1 - Lower Leg Wound Laterality: Right, Anterior Cleanser: Soap and Water ([place]) 3 x Per Week/30 Days Discharge Instructions: Home Health to wash leg with soap and water with dressing changes. May shower and wash wound with dial antibacterial soap and water prior to dressing change. Peri-Wound Care: Zinc Oxide Ointment 30g tube (Home Health) 3 x Per Week/30 Days Discharge Instructions: Apply Zinc Oxide as needed for maceration, wetness to periwound with each dressing change Peri-Wound Care: Sween Lotion (Moisturizing lotion) (Home Health) 3 x Per Week/30 Days Discharge Instructions: home health to Apply moisturizing lotion as directed Topical: Triamcinolone ([place]) 3 x Per Week/30 Days Discharge Instructions: Apply thin layer of Triamcinolone on wound bed under alginate Prim Dressing: KerraCel Ag Gelling Fiber Dressing, 4x5 in (silver alginate) (Home Health) 3 x Per Week/30 Days ary Discharge Instructions: Apply silver alginate to wound bed as instructed Secondary Dressing: Woven Gauze Sponge, Non-Sterile 4x4 in (Home Health) 3 x Per Week/30 Days Discharge Instructions: Apply over primary dressing as directed. Secondary Dressing: ABD Pad, 8x10 (Home Health) 3 x Per Week/30 Days Discharge Instructions: Apply over primary dressing as directed. Compression Wrap: FourPress (4 layer compression wrap) (Home Health) 3 x Per Week/30 Days Discharge Instructions: Apply four layer compression as directed. Electronic  Signature(s) Signed: 02/10/2021 5:52:07 PM By: Worthy Keeler PA-C Signed: 02/11/2021 6:01:52 PM By: Levan Hurst RN, BSN Entered By: Levan Hurst on 02/10/2021 16:42:02 -------------------------------------------------------------------------------- Problem List Details Patient Name: Date of Service: [place] [name], Joseph BERT A. 02/10/2021 3:00 PM Medical Record Number: [id number] Patient Account Number: [id number] Date of Birth/Sex: Treating RN: 01-23-1932 (87 y.o. [name] Primary Care Provider: [name] Provider: Treating Provider/Extender: Theadora Rama in Treatment: 7 Active Problems ICD-10 Encounter Code Description Active Date MDM Diagnosis I89.0 Lymphedema, not elsewhere classified 12/19/2020 No Yes I87.331 Chronic venous hypertension (idiopathic) with ulcer and inflammation of right 12/19/2020 No Yes lower extremity L97.811 Non-pressure chronic ulcer of other part of right lower leg limited to breakdown 12/19/2020 No Yes of skin Inactive Problems Resolved Problems Electronic Signature(s) Signed: 02/10/2021 2:57:21 PM By: Worthy Keeler PA-C Entered By: Worthy Keeler on 02/10/2021 14:57:21 -------------------------------------------------------------------------------- Progress Note Details Patient Name: Date of Service: [place] [name], Joseph BERT A. 02/10/2021 3:00 PM Medical Record Number: [id number] Patient Account Number: [id number] Date of Birth/Sex: Treating RN: [birth year] (87 y.o. [name] Primary Care Provider: [name] Provider: Treating Provider/Extender: Theadora Rama in Treatment: 7 Subjective Chief Complaint Information obtained from Patient 12/19/2020; patient is here for review of wounds on his right anterior lower History of Present Illness (HPI) ADMISSION 12/19/2020 This is a 87 year old man who arrives accompanied by his son who used to be a patient in this  clinic. He has had chronic wounds on the right anterior lower leg which have been opening and closing on and off for the last 7 to 8 months. He says these will blister then  opened up then closed and blister opened up and closed. He has [facility] home [facility] and I think they have been applying alginate and 3 layer wraps however apparently the 3 layer light wrap was not put on properly. He is also had compression stockings but does not feel he can get these on. Past medical history includes COPD on chronic oxygen, pulmonary hypertension, obstructive sleep apnea, type 2 diabetes, diastolic heart failure, chronic kidney disease stage III and atrial fibrillation on Eliquis ABI in our clinic was 1.04 on the right 01/13/2021 patient admitted to the clinic over 3 weeks ago. Since he was admitted here he was hospitalized from 12/26/2020 through 01/02/2021 with Covid pneumonia. He improved with treatment for bilateral infiltrates. Discharged on 3 L of nasal cannula. He was also felt to have acute on chronic diastolic heart failure. Does not look like they did anything particular with the wounds on the right anterior lower leg. The patient has a large area on the right anterior lower leg that is oval-shaped and [name]-pigmented. He has several small open areas scattered throughout the surface of this. This does not look too much different from last time. It is difficult to get a history of him exactly how and when this happened he says it actually happened during the recent hospitalization however he certainly had this before that because we saw him 1 time in the clinic. He did has had a vein stripping in this area done remotely but I cannot really see the issue or the connection here. He does not describe a lot of pain 2/14; patient arrives in clinic today with a much better looking area on the right anterior lower leg. Only a few small open areas remaining what was a fully [name]- epithelialized area. I must say I had  some concerns about this being a primary cutaneous problem when I first saw this with the major differential being chronic stasis dermatitis. He arrives in clinic today with the area almost completely epithelialized. I feel better about the idea of not biopsying this. He is going to need compression stockings and we spent some time talking about this perhaps [place] wraps. He has lymphedema in this area is certainly will not maintain skin integrity if the edema is not controlled. Surface is very adherent to underlying subcutaneous tissue 2/21; he comes in today with a disappointing deterioration in this area. He has an oval-shaped hypopigmented area with multiple small areas of skin breakdown. I had some thoughts about the etiology of this when he first came in here however it looked as though this was closing with standard care for venous disease/stasis dermatitis. Certainly stasis dermatitis could cause pigmentary changes. The patient says that people have been working on this for many months and that it will close and then reopen. The patient states that this will often open with blisters although I have not seen anything that looked like this there is no evidence of infection 02/10/2021 upon evaluation today patient appears to be doing about the same in regard to his leg. Unfortunately he is still having a lot of drainage from the right leg. The left leg he has a compression wrap on at this point that is a juxta light. Fortunately there is no signs of active infection currently which is good news. I really feel like he may need stronger compression he apparently did just have some change in his fluid pill to something a little different. Objective Constitutional Well-nourished and well-hydrated in no acute distress. Vitals  Time Taken: 3:35 PM, Height: 69 in, Weight: 190 lbs, BMI: 28.1, Temperature: 98.3 F, Pulse: 97 bpm, Respiratory Rate: 20 breaths/min, Blood Pressure: 176/82 mmHg, Capillary  Blood Glucose: 118 mg/dl. Respiratory normal breathing without difficulty. Psychiatric this patient is able to make decisions and demonstrates good insight into disease process. Alert and Oriented x 3. pleasant and cooperative. General Notes: Upon inspection patient's wound bed actually showed signs of again having several scattered areas of weeping and leaking he has 3+ pitting edema despite being on fluid pills and having a 3 layer compression wrap. I think we may need to increase this to a 4-layer compression wrap to try to help keep some of the edema under better control here and see if that will help resolve some of the swelling and weeping. Integumentary (Hair, Skin) Wound #1 status is Open. Original cause of wound was Gradually Appeared. The date acquired was: 10/07/2020. The wound has been in treatment 7 weeks. The wound is located on the Right,Anterior Lower Leg. The wound measures 15cm length x 10.5cm width x 0.1cm depth; 123.7cm^2 area and 12.37cm^3 volume. There is Fat Layer (Subcutaneous Tissue) exposed. There is no tunneling or undermining noted. There is a medium amount of serosanguineous drainage noted. The wound margin is flat and intact. There is large (67-100%) red granulation within the wound bed. There is no necrotic tissue within the wound bed. Assessment Active Problems ICD-10 Lymphedema, not elsewhere classified Chronic venous hypertension (idiopathic) with ulcer and inflammation of right lower extremity Non-pressure chronic ulcer of other part of right lower leg limited to breakdown of skin Procedures Wound #1 Pre-procedure diagnosis of Wound #1 is a Venous Leg Ulcer located on the Right,Anterior Lower Leg . There was a Four Layer Compression Therapy Procedure by Levan Hurst, RN. Post procedure Diagnosis Wound #1: Same as Pre-Procedure Plan Follow-up Appointments: Return Appointment in 1 week. Bathing/ Shower/ Hygiene: May shower with protection but do not get  wound dressing(s) wet. Edema Control - Lymphedema / SCD / Other: Elevate legs to the level of the heart or above for 30 minutes daily and/or when sitting, a frequency of: - throughout the day 3-4 times a day. Avoid standing for long periods of time. Patient to wear own compression stockings every day. - Apply compression stocking to left leg in the morning and remove at night. Apply lotion every night after removing stocking. Exercise regularly Home Health: New wound care orders this week; continue Home Health for wound care. May utilize formulary equivalent dressing for wound treatment orders unless otherwise specified. - Increase compression to 4 layer Dressing changes to be completed by [place] on Monday / Wednesday / Friday except when patient has scheduled visit at [facility] clinic will change on Mondays Other [place] Orders/Instructions: - [facility] twice a week WOUND #1: - Lower Leg Wound Laterality: Right, Anterior Cleanser: Soap and Water ([place]) 3 x Per Week/30 Days Discharge Instructions: Home Health to wash leg with soap and water with dressing changes. May shower and wash wound with dial antibacterial soap and water prior to dressing change. Peri-Wound Care: Zinc Oxide Ointment 30g tube (Home Health) 3 x Per Week/30 Days Discharge Instructions: Apply Zinc Oxide as needed for maceration, wetness to periwound with each dressing change Peri-Wound Care: Sween Lotion (Moisturizing lotion) (Home Health) 3 x Per Week/30 Days Discharge Instructions: home health to Apply moisturizing lotion as directed Topical: Triamcinolone ([place]) 3 x Per Week/30 Days Discharge Instructions: Apply thin layer of Triamcinolone  on wound bed under alginate Prim Dressing: KerraCel Ag Gelling Fiber Dressing, 4x5 in (silver alginate) (Home Health) 3 x Per Week/30 Days ary Discharge Instructions: Apply silver alginate to wound bed as instructed Secondary Dressing: Woven  Gauze Sponge, Non-Sterile 4x4 in (Home Health) 3 x Per Week/30 Days Discharge Instructions: Apply over primary dressing as directed. Secondary Dressing: ABD Pad, 8x10 (Home Health) 3 x Per Week/30 Days Discharge Instructions: Apply over primary dressing as directed. Com pression Wrap: FourPress (4 layer compression wrap) (Home Health) 3 x Per Week/30 Days Discharge Instructions: Apply four layer compression as directed. 1. Would recommend currently that we continue with the wound care measures as before again we will switch apparently to the silver alginate home health has not been able to get the [facility] but again I think the alginate seems to have done a pretty good job that is not the main issue here. I think the main issue is any stronger compression. 2. I am also can recommend that the patient go ahead and be switched to a 4-layer compression wrap. 3. He also needs to elevate his legs much as possible try to keep edema under good control. We will see patient back for reevaluation in 1 week here in the clinic. If anything worsens or changes patient will contact our office for additional recommendations. Electronic Signature(s) Signed: 02/10/2021 4:45:44 PM By: Worthy Keeler PA-C Entered By: Worthy Keeler on 02/10/2021 16:45:44 -------------------------------------------------------------------------------- SuperBill Details Patient Name: Date of Service: [place] [name], Joseph BERT A. 02/10/2021 Medical Record Number: [id number] Patient Account Number: [id number] Date of Birth/Sex: Treating RN: [birth year] (87 y.o. [name] Primary Care Provider: [name] Provider: Treating Provider/Extender: Theadora Rama in Treatment: 7 Diagnosis Coding ICD-10 Codes Code Description I89.0 Lymphedema, not elsewhere classified I87.331 Chronic venous hypertension (idiopathic) with ulcer and inflammation of right lower extremity L97.811 Non-pressure  chronic ulcer of other part of right lower leg limited to breakdown of skin Facility Procedures CPT4 Code: [id number] Description: (Facility Use Only) [phone] - [place] RT LEG Modifier: Quantity: 1 Physician Procedures : CPT4 Code Description Modifier [id number] - WC PHYS LEVEL 3 - EST PT ICD-10 Diagnosis Description I89.0 Lymphedema, not elsewhere classified I87.331 Chronic venous hypertension (idiopathic) with ulcer and inflammation of right lower extremity  L97.811 Non-pressure chronic ulcer of other part of right lower leg limited to breakdown of skin Quantity: 1 Electronic Signature(s) Signed: 02/10/2021 4:45:58 PM By: Worthy Keeler PA-C Entered By: Worthy Keeler on 02/10/2021 16:45:57

## 2021-02-11 DIAGNOSIS — U071 COVID-19: Secondary | ICD-10-CM | POA: Diagnosis not present

## 2021-02-11 DIAGNOSIS — I480 Paroxysmal atrial fibrillation: Secondary | ICD-10-CM | POA: Diagnosis not present

## 2021-02-11 DIAGNOSIS — J9621 Acute and chronic respiratory failure with hypoxia: Secondary | ICD-10-CM | POA: Diagnosis not present

## 2021-02-11 DIAGNOSIS — I130 Hypertensive heart and chronic kidney disease with heart failure and stage 1 through stage 4 chronic kidney disease, or unspecified chronic kidney disease: Secondary | ICD-10-CM | POA: Diagnosis not present

## 2021-02-11 DIAGNOSIS — I5033 Acute on chronic diastolic (congestive) heart failure: Secondary | ICD-10-CM | POA: Diagnosis not present

## 2021-02-11 DIAGNOSIS — E1122 Type 2 diabetes mellitus with diabetic chronic kidney disease: Secondary | ICD-10-CM | POA: Diagnosis not present

## 2021-02-11 NOTE — Progress Notes (Signed)
[name], [name] ([id number]) Visit Report for 02/10/2021 Arrival Information Details Patient Name: Date of Service: [name], [name] 02/10/2021 3:00 PM Medical Record Number: [id number] Patient Account Number: [id number] Date of Birth/Sex: Treating RN: 18-Jun-1932 (87 y.o. [name] Primary Care [name]: [name] Other Clinician: Referring [name]: Treating [name]/Extender: [name] in Treatment: 7 Visit Information History Since Last Visit Added or deleted any medications: No Patient Arrived: Ambulatory Any new allergies or adverse reactions: No Arrival Time: 15:34 Had a fall or experienced change in No Accompanied By: som activities of daily living that may affect Transfer Assistance: None risk of falls: Patient Identification Verified: Yes Signs or symptoms of abuse/neglect since last visito No Secondary Verification Process Completed: Yes Hospitalized since last visit: No Patient Requires Transmission-Based Precautions: No Implantable device outside of the clinic excluding No Patient Has Alerts: Yes cellular tissue based products placed in the center Patient Alerts: Patient on Blood Thinner since last visit: Has Dressing in Place as Prescribed: Yes Pain Present Now: No Electronic Signature(s) Signed: 02/11/2021 11:11:03 AM By: [name] Entered By: [name] on 02/10/2021 15:35:46 -------------------------------------------------------------------------------- Compression Therapy Details Patient Name: Date of Service: Joseph DE, [name] 02/10/2021 3:00 PM Medical Record Number: [id number] Patient Account Number: [id number] Date of Birth/Sex: Treating RN: 1932/02/11 (87 y.o. [name] Primary Care Mayra Brahm: [name] Other Clinician: Referring Rewa Weissberg: Treating [name]/Extender: [name] in Treatment: 7 Compression Therapy Performed for Wound Assessment: Wound #1 Right,Anterior Lower  Leg Performed By: Clinician Joseph Hurst, RN Compression Type: Four Layer Post Procedure Diagnosis Same as Pre-procedure Electronic Signature(s) Signed: 02/11/2021 6:01:52 PM By: Joseph Hurst RN, BSN Entered By: [name] on 02/10/2021 16:40:11 -------------------------------------------------------------------------------- Encounter Discharge Information Details Patient Name: Date of Service: Joseph DE, [name] 02/10/2021 3:00 PM Medical Record Number: [id number] Patient Account Number: [id number] Date of Birth/Sex: Treating RN: 1932-07-07 (87 y.o. [name] Primary Care Lucetta Baehr: [name] Other Clinician: Referring [name]: Treating Inas Avena/Extender: [name] in Treatment: 7 Encounter Discharge Information Items Discharge Condition: Stable Ambulatory Status: Ambulatory Discharge Destination: Home Transportation: Private Auto Accompanied By: family member Schedule Follow-up Appointment: Yes Clinical Summary of Care: Electronic Signature(s) Signed: 02/10/2021 5:26:37 PM By: [name] Entered By: [name] on 02/10/2021 17:02:11 -------------------------------------------------------------------------------- Lower Extremity Assessment Details Patient Name: Date of Service: [name], [name] 02/10/2021 3:00 PM Medical Record Number: [id number] Patient Account Number: [id number] Date of Birth/Sex: Treating RN: Dec 06, 1932 (87 y.o. [name], [name] Primary Care [name]: [name] Other Clinician: Referring [name]: Treating [name]/Extender: [name] in Treatment: 7 Edema Assessment Assessed: [name]: No] [name]: Yes] Edema: [Left: Ye] [Right: s] Calf Left: Right: Point of Measurement: From Medial Instep 29 cm Ankle Left: Right: Point of Measurement: From Medial Instep 23 cm Vascular Assessment Pulses: Dorsalis Pedis Palpable: [Right:Yes] Posterior Tibial Palpable: [Right:Yes] Electronic  Signature(s) Signed: 02/10/2021 5:34:21 PM By: Joseph Hammock RN Entered By: [name] on 02/10/2021 15:54:19 -------------------------------------------------------------------------------- [place] Details Patient Name: Date of Service: [name], [name] 02/10/2021 3:00 PM Medical Record Number: [id number] Patient Account Number: [id number] Date of Birth/Sex: Treating RN: [birth year] (87 y.o. [name] Primary Care [name]: [name] Other Clinician: Referring [name]: Treating Skylin [name]/Extender: [name] in Treatment: 7 Active Inactive Abuse / Safety / Falls / Self Care Management Nursing Diagnoses: Potential for falls Goals: Patient will remain injury free related to  falls Date Initiated: 12/19/2020 Target Resolution Date: 02/21/2021 Goal Status: Active Interventions: Assess Activities of Daily Living upon admission and as needed Assess fall risk on admission and as needed Notes: Nutrition Nursing Diagnoses: Potential for alteratiion in Nutrition/Potential for imbalanced nutrition Goals: Patient/caregiver agrees to and verbalizes understanding of need to obtain nutritional consultation Date Initiated: 12/19/2020 Date Inactivated: 01/13/2021 Target Resolution Date: 01/10/2021 Goal Status: Met Patient/caregiver will maintain therapeutic glucose control Date Initiated: 12/19/2020 Target Resolution Date: 02/21/2021 Goal Status: Active Interventions: Assess HgA1c results as ordered upon admission and as needed Provide education on elevated blood sugars and impact on wound healing Provide education on nutrition Treatment Activities: Education provided on Nutrition : 01/13/2021 Obtain HgA1c : 12/19/2020 Notes: Wound/Skin Impairment Nursing Diagnoses: Knowledge deficit related to ulceration/compromised skin integrity Goals: Patient/caregiver will verbalize understanding of skin care regimen Date Initiated:  12/19/2020 Target Resolution Date: 02/21/2021 Goal Status: Active Interventions: Assess patient/caregiver ability to obtain necessary supplies Assess patient/caregiver ability to perform ulcer/skin care regimen upon admission and as needed Provide education on ulcer and skin care Treatment Activities: Skin care regimen initiated : 12/19/2020 Topical wound management initiated : 12/19/2020 Notes: Electronic Signature(s) Signed: 02/11/2021 6:01:52 PM By: Joseph Hurst RN, BSN Entered By: [name] on 02/10/2021 16:38:37 -------------------------------------------------------------------------------- Pain Assessment Details Patient Name: Date of Service: Joseph DE, [name] 02/10/2021 3:00 PM Medical Record Number: [id number] Patient Account Number: [id number] Date of Birth/Sex: Treating RN: [birth year] (87 y.o. [name] Primary Care Shams Fill: [name] Other Clinician: Referring Johnella Crumm: Treating Dorsel Flinn/Extender: [name] in Treatment: 7 Active Problems Location of Pain Severity and Description of Pain Patient Has Paino No Site Locations Pain Management and Medication Current Pain Management: Electronic Signature(s) Signed: 02/11/2021 11:11:03 AM By: [name] Signed: 02/11/2021 6:01:52 PM By: Joseph Hurst RN, BSN Entered By: [name] on 02/10/2021 15:36:16 -------------------------------------------------------------------------------- Patient/Caregiver Education Details Patient Name: Date of Service: [name], [place] 3/7/2022andnbsp3:00 PM Medical Record Number: [id number] Patient Account Number: [id number] Date of Birth/Gender: Treating RN: 1932-03-08 (88 y.o. [name] Primary Care Physician: [name] Other Clinician: Referring Physician: Treating Physician/Extender: [name] in Treatment: 7 Education Assessment Education Provided To: Patient Education Topics Provided Wound/Skin  Impairment: Methods: Explain/Verbal Responses: State content correctly [employer]) Signed: 02/11/2021 6:01:52 PM By: Joseph Hurst RN, BSN Entered By: [name] on 02/10/2021 16:38:57 -------------------------------------------------------------------------------- Wound Assessment Details Patient Name: Date of Service: Joseph DE, [name] 02/10/2021 3:00 PM Medical Record Number: [id number] Patient Account Number: [id number] Date of Birth/Sex: Treating RN: July 23, 1932 (87 y.o. [name] Primary Care Lisandro Meggett: [name] Other Clinician: Referring Ehan Freas: Treating Zabdiel Dripps/Extender: [name] in Treatment: 7 Wound Status Wound Number: 1 Primary Venous Leg Ulcer Etiology: Wound Location: Right, Anterior Lower Leg Wound Open Wounding Event: Gradually Appeared Status: Date Acquired: 10/07/2020 Comorbid Chronic Obstructive Pulmonary Disease (COPD), Sleep Apnea, Weeks Of Treatment: 7 History: Arrhythmia, Congestive Heart Failure, Hypertension, Myocardial Clustered Wound: Yes Infarction, Peripheral Venous Disease, Type II Diabetes Photos Wound Measurements Length: (cm) 15 Width: (cm) 10 Depth: (cm) 0. Clustered Quantity: 10 Area: (cm) 1 Volume: (cm) 1 % Reduction in Area: -104.5% .5 % Reduction in Volume: -104.5% 1 Epithelialization: Medium (34-66%) Tunneling: No 23.7 Undermining: No 2.37 Wound Description Classification: Full Thickness Without Exposed Support Structures Wound Margin: Flat and Intact Exudate Amount: Medium Exudate Type: Serosanguineous Exudate Color: red, brown Foul Odor After Cleansing: No Slough/Fibrino No Wound Bed Granulation Amount: Large (67-100%)  Exposed Structure Granulation Quality: Red Fascia Exposed: No Necrotic Amount: None Present (0%) Fat Layer (Subcutaneous Tissue) Exposed: Yes Tendon Exposed: No Muscle Exposed: No Joint Exposed: No Bone Exposed: No Treatment Notes Wound #1 (Lower Leg)  Wound Laterality: Right, Anterior Cleanser Soap and Water Discharge Instruction: Home Health to wash leg with soap and water with dressing changes. May shower and wash wound with dial antibacterial soap and water prior to dressing change. Peri-Wound Care Zinc Oxide Ointment 30g tube Discharge Instruction: Apply Zinc Oxide as needed for maceration, wetness to periwound with each dressing change Sween Lotion (Moisturizing lotion) Discharge Instruction: home health to Apply moisturizing lotion as directed Topical Triamcinolone Discharge Instruction: Apply thin layer of Triamcinolone on wound bed under alginate Primary Dressing KerraCel Ag Gelling Fiber Dressing, 4x5 in (silver alginate) Discharge Instruction: Apply silver alginate to wound bed as instructed Secondary Dressing Woven Gauze Sponge, Non-Sterile 4x4 in Discharge Instruction: Apply over primary dressing as directed. ABD Pad, 8x10 Discharge Instruction: Apply over primary dressing as directed. Secured With Compression Wrap FourPress (4 layer compression wrap) Discharge Instruction: Apply four layer compression as directed. Compression Stockings Add-Ons Electronic Signature(s) Signed: 02/11/2021 11:11:03 AM By: [name] Signed: 02/11/2021 6:01:52 PM By: Joseph Hurst RN, BSN Previous Signature: 02/10/2021 5:34:21 PM Version By: Joseph Hammock RN Entered By: [name] on 02/11/2021 10:53:48 -------------------------------------------------------------------------------- Vitals Details Patient Name: Date of Service: Joseph DE, [name] 02/10/2021 3:00 PM Medical Record Number: [id number] Patient Account Number: [id number] Date of Birth/Sex: Treating RN: 05/05/1932 (87 y.o. [name] Primary Care Damarious Holtsclaw: [name] Other Clinician: Referring Raileigh Sabater: Treating Ezri Landers/Extender: [name] in Treatment: 7 Vital Signs Time Taken: 15:35 Temperature (F): 98.3 Height (in):  69 Pulse (bpm): 97 Weight (lbs): 190 Respiratory Rate (breaths/min): 20 Body Mass Index (BMI): 28.1 Blood Pressure (mmHg): 176/82 Capillary Blood Glucose (mg/dl): 118 Reference Range: 80 - 120 mg / dl Electronic Signature(s) Signed: 02/11/2021 11:11:03 AM By: [name] Entered By: [name] on 02/10/2021 15:36:09

## 2021-02-12 DIAGNOSIS — I480 Paroxysmal atrial fibrillation: Secondary | ICD-10-CM | POA: Diagnosis not present

## 2021-02-12 DIAGNOSIS — I5033 Acute on chronic diastolic (congestive) heart failure: Secondary | ICD-10-CM | POA: Diagnosis not present

## 2021-02-12 DIAGNOSIS — E1122 Type 2 diabetes mellitus with diabetic chronic kidney disease: Secondary | ICD-10-CM | POA: Diagnosis not present

## 2021-02-12 DIAGNOSIS — J9621 Acute and chronic respiratory failure with hypoxia: Secondary | ICD-10-CM | POA: Diagnosis not present

## 2021-02-13 ENCOUNTER — Other Ambulatory Visit: Payer: Self-pay | Admitting: Internal Medicine

## 2021-02-13 ENCOUNTER — Ambulatory Visit: Payer: Medicare Other | Admitting: Internal Medicine

## 2021-02-14 DIAGNOSIS — I5033 Acute on chronic diastolic (congestive) heart failure: Secondary | ICD-10-CM | POA: Diagnosis not present

## 2021-02-14 DIAGNOSIS — E1122 Type 2 diabetes mellitus with diabetic chronic kidney disease: Secondary | ICD-10-CM | POA: Diagnosis not present

## 2021-02-14 DIAGNOSIS — I480 Paroxysmal atrial fibrillation: Secondary | ICD-10-CM | POA: Diagnosis not present

## 2021-02-14 DIAGNOSIS — J9621 Acute and chronic respiratory failure with hypoxia: Secondary | ICD-10-CM | POA: Diagnosis not present

## 2021-02-14 DIAGNOSIS — I517 Cardiomegaly: Secondary | ICD-10-CM | POA: Diagnosis not present

## 2021-02-14 DIAGNOSIS — I130 Hypertensive heart and chronic kidney disease with heart failure and stage 1 through stage 4 chronic kidney disease, or unspecified chronic kidney disease: Secondary | ICD-10-CM | POA: Diagnosis not present

## 2021-02-14 DIAGNOSIS — U071 COVID-19: Secondary | ICD-10-CM | POA: Diagnosis not present

## 2021-02-14 DIAGNOSIS — R918 Other nonspecific abnormal finding of lung field: Secondary | ICD-10-CM | POA: Diagnosis not present

## 2021-02-17 ENCOUNTER — Other Ambulatory Visit: Payer: Self-pay

## 2021-02-17 ENCOUNTER — Encounter (HOSPITAL_BASED_OUTPATIENT_CLINIC_OR_DEPARTMENT_OTHER): Payer: Medicare Other | Admitting: Internal Medicine

## 2021-02-17 DIAGNOSIS — I890 Lymphedema, not elsewhere classified: Secondary | ICD-10-CM | POA: Diagnosis not present

## 2021-02-17 DIAGNOSIS — Z7901 Long term (current) use of anticoagulants: Secondary | ICD-10-CM | POA: Diagnosis not present

## 2021-02-17 DIAGNOSIS — I87331 Chronic venous hypertension (idiopathic) with ulcer and inflammation of right lower extremity: Secondary | ICD-10-CM | POA: Diagnosis not present

## 2021-02-17 DIAGNOSIS — L97811 Non-pressure chronic ulcer of other part of right lower leg limited to breakdown of skin: Secondary | ICD-10-CM | POA: Diagnosis not present

## 2021-02-17 DIAGNOSIS — L97812 Non-pressure chronic ulcer of other part of right lower leg with fat layer exposed: Secondary | ICD-10-CM | POA: Diagnosis not present

## 2021-02-17 NOTE — Progress Notes (Signed)
BRADLEY, BOSTELMAN ([id number]) Visit Report for 02/17/2021 HPI Details Patient Name: Date of Service: [place], [name] 02/17/2021 3:30 PM Medical Record Number: [id number] Patient Account Number: [id number] Date of Birth/Sex: Treating RN: Feb 01, 1932 (87 y.o. [name] Primary Care Provider: [name] Provider: Treating Provider/Extender: Bebe Shaggy in Treatment: 8 History of Present Illness HPI Description: ADMISSION 12/19/2020 This is a 87 year old man who arrives accompanied by his son who used to be a patient in this clinic. He has had chronic wounds on the right anterior lower leg which have been opening and closing on and off for the last 7 to 8 months. He says these will blister then opened up then closed and blister opened up and closed. He has [facility] and I think they have been applying alginate and 3 layer wraps however apparently the 3 layer light wrap was not put on properly. He is also had compression stockings but does not feel he can get these on. Past medical history includes COPD on chronic oxygen, pulmonary hypertension, obstructive sleep apnea, type 2 diabetes, diastolic heart failure, chronic kidney disease stage III and atrial fibrillation on Eliquis ABI in our clinic was 1.04 on the right 01/13/2021 patient admitted to the clinic over 3 weeks ago. Since he was admitted here he was hospitalized from 12/26/2020 through 01/02/2021 with Covid pneumonia. He improved with treatment for bilateral infiltrates. Discharged on 3 L of nasal cannula. He was also felt to have acute on chronic diastolic heart failure. Does not look like they did anything particular with the wounds on the right anterior lower leg. The patient has a large area on the right anterior lower leg that is oval-shaped and de-pigmented. He has several small open areas scattered throughout the surface of this. This does not look too much different from  last time. It is difficult to get a history of him exactly how and when this happened he says it actually happened during the recent hospitalization however he certainly had this before that because we saw him 1 time in the clinic. He did has had a vein stripping in this area done remotely but I cannot really see the issue or the connection here. He does not describe a lot of pain 2/14; patient arrives in clinic today with a much better looking area on the right anterior lower leg. Only a few small open areas remaining what was a fully de- epithelialized area. I must say I had some concerns about this being a primary cutaneous problem when I first saw this with the major differential being chronic stasis dermatitis. He arrives in clinic today with the area almost completely epithelialized. I feel better about the idea of not biopsying this. He is going to need compression stockings and we spent some time talking about this perhaps [place] wraps. He has lymphedema in this area is certainly will not maintain skin integrity if the edema is not controlled. Surface is very adherent to underlying subcutaneous tissue 2/21; he comes in today with a disappointing deterioration in this area. He has an oval-shaped hypopigmented area with multiple small areas of skin breakdown. I had some thoughts about the etiology of this when he first came in here however it looked as though this was closing with standard care for venous disease/stasis dermatitis. Certainly stasis dermatitis could cause pigmentary changes. The patient says that people have been working on this for many months and that it will close and then  reopen. The patient states that this will often open with blisters although I have not seen anything that looked like this there is no evidence of infection 02/10/2021 upon evaluation today patient appears to be doing about the same in regard to his leg. Unfortunately he is still having a lot of drainage from  the right leg. The left leg he has a compression wrap on at this point that is a juxta light. Fortunately there is no signs of active infection currently which is good news. I really feel like he may need stronger compression he apparently did just have some change in his fluid pill to something a little different. 3/14; patient comes in today with a cluster of small open areas in the middle of this deep pigmented area on his right anterior lower leg. He does not complain of pain. We increase the compression last week from 3-4 layer and he seems to have tolerated this satisfactorily. The cluster of open areas currently is not in the exact same place as when he first came in. I am assuming that the depigmentation/hypopigmentation is secondary to chronic stasis dermatitis. In talking to the patient today he says the area of hypopigmentation has been there for about 2 years. He has a remote history of vein harvesting for a CABG 30 years ago according to the patient Electronic Signature(s) Signed: 02/17/2021 5:58:13 PM By: Linton Ham MD Entered By: Linton Ham on 02/17/2021 15:35:54 -------------------------------------------------------------------------------- Physical Exam Details Patient Name: Date of Service: [name], [name] 02/17/2021 3:30 PM Medical Record Number: [id number] Patient Account Number: [id number] Date of Birth/Sex: Treating RN: [birth year] (87 y.o. [name] Primary Care Provider: [name] Provider: Treating Provider/Extender: Bebe Shaggy in Treatment: 8 Constitutional Patient is hypertensive.. Pulse regular and within target range for patient.Marland Kitchen Respirations regular, non-labored and within target range.. Temperature is normal and within the target range for the patient.Marland Kitchen Appears in no distress. Cardiovascular Pedal pulses palpable on the right. Edema control is reasonably good. Notes Wound exam; again several  small areas in the upper lateral part of the large depigmented area on his right anterior lower leg there are a cluster of small open areas. None of the open areas appear to have a nonviable surface. The patient states that the areas will blister and then open but I have never really seen a blister. Electronic Signature(s) Signed: 02/17/2021 5:58:13 PM By: Linton Ham MD Entered By: Linton Ham on 02/17/2021 15:37:33 -------------------------------------------------------------------------------- Physician Orders Details Patient Name: Date of Service: [name], [name] 02/17/2021 3:30 PM Medical Record Number: [id number] Patient Account Number: [id number] Date of Birth/Sex: Treating RN: August 22, 1932 (87 y.o. [name] Primary Care Provider: [name] Provider: Treating Provider/Extender: Bebe Shaggy in Treatment: 8 Verbal / Phone Orders: No Diagnosis Coding Follow-up Appointments Return Appointment in 2 weeks. Bathing/ Shower/ Hygiene May shower with protection but do not get wound dressing(s) wet. Edema Control - Lymphedema / SCD / Other Elevate legs to the level of the heart or above for 30 minutes daily and/or when sitting, a frequency of: - throughout the day 3-4 times a day. Avoid standing for long periods of time. Patient to wear own compression stockings every day. - Apply compression stocking to left leg in the morning and remove at night. Apply lotion every night after removing stocking. Exercise regularly [facility] wound care orders this week; continue Home Health for wound care. May utilize  formulary equivalent dressing for wound treatment orders unless otherwise specified. - Change primary dressing to Iodoflex or Iodosorb to open areas only. Dressing changes to be completed by [place] on Monday / Wednesday / Friday except when patient has scheduled visit at [facility]. Other Home Health  Orders/Instructions: - [facility] twice a week Wound Treatment Wound #1 - Lower Leg Wound Laterality: Right, Anterior Cleanser: Soap and Water ([place]) 3 x Per Week/30 Days Discharge Instructions: Home Health to wash leg with soap and water with dressing changes. May shower and wash wound with dial antibacterial soap and water prior to dressing change. Peri-Wound Care: Zinc Oxide Ointment 30g tube (Home Health) 3 x Per Week/30 Days Discharge Instructions: Apply Zinc Oxide as needed for maceration, wetness to periwound with each dressing change Peri-Wound Care: Sween Lotion (Moisturizing lotion) (Home Health) 3 x Per Week/30 Days Discharge Instructions: home health to Apply moisturizing lotion as directed Topical: Triamcinolone ([place]) 3 x Per Week/30 Days Discharge Instructions: Apply Triamcinolone liberally on wound bed Prim Dressing: IODOFLEX 0.9% Cadexomer Iodine Pad 4x6 cm ary 3 x Per Week/30 Days Discharge Instructions: Or Iodosorb for [facility] Secondary Dressing: Woven Gauze Sponge, Non-Sterile 4x4 in (Home Health) 3 x Per Week/30 Days Discharge Instructions: Apply over primary dressing as directed. Secondary Dressing: ABD Pad, 8x10 (Home Health) 3 x Per Week/30 Days Discharge Instructions: Apply over primary dressing as directed. Compression Wrap: FourPress (4 layer compression wrap) (Home Health) 3 x Per Week/30 Days Discharge Instructions: Apply four layer compression as directed. Electronic Signature(s) Signed: 02/17/2021 5:53:54 PM By: Lorrin Jackson Signed: 02/17/2021 5:58:13 PM By: Linton Ham MD Previous Signature: 02/17/2021 3:12:00 PM Version By: Lorrin Jackson Entered By: Lorrin Jackson on 02/17/2021 15:34:27 -------------------------------------------------------------------------------- Problem List Details Patient Name: Date of Service: [place], [place] BERT A. 02/17/2021 3:30 PM Medical Record Number: [id number] Patient Account Number: [id number] Date of  Birth/Sex: Treating RN: 1932/05/19 (87 y.o. [name] Primary Care Provider: [name] Provider: Treating Provider/Extender: Bebe Shaggy in Treatment: 8 Active Problems ICD-10 Encounter Code Description Active Date MDM Diagnosis I89.0 Lymphedema, not elsewhere classified 12/19/2020 No Yes I87.331 Chronic venous hypertension (idiopathic) with ulcer and inflammation of right 12/19/2020 No Yes lower extremity L97.811 Non-pressure chronic ulcer of other part of right lower leg limited to breakdown 12/19/2020 No Yes of skin Inactive Problems Resolved Problems Electronic Signature(s) Signed: 02/17/2021 5:58:13 PM By: Linton Ham MD Entered By: Linton Ham on 02/17/2021 15:33:46 -------------------------------------------------------------------------------- Progress Note Details Patient Name: Date of Service: [name], [name] 02/17/2021 3:30 PM Medical Record Number: [id number] Patient Account Number: [id number] Date of Birth/Sex: Treating RN: 12-01-1932 (87 y.o. [name] Primary Care Provider: Other Clinician: [name] Referring Provider: Treating Provider/Extender: Bebe Shaggy in Treatment: 8 Subjective History of Present Illness (HPI) ADMISSION 12/19/2020 This is a 87 year old man who arrives accompanied by his son who used to be a patient in this clinic. He has had chronic wounds on the right anterior lower leg which have been opening and closing on and off for the last 7 to 8 months. He says these will blister then opened up then closed and blister opened up and closed. He has [facility] and I think they have been applying alginate and 3 layer wraps however apparently the 3 layer light wrap was not put on properly. He is also had compression stockings but does not feel he can get these on. Past medical history includes  COPD on chronic oxygen, pulmonary hypertension,  obstructive sleep apnea, type 2 diabetes, diastolic heart failure, chronic kidney disease stage III and atrial fibrillation on Eliquis ABI in our clinic was 1.04 on the right 01/13/2021 patient admitted to the clinic over 3 weeks ago. Since he was admitted here he was hospitalized from 12/26/2020 through 01/02/2021 with Covid pneumonia. He improved with treatment for bilateral infiltrates. Discharged on 3 L of nasal cannula. He was also felt to have acute on chronic diastolic heart failure. Does not look like they did anything particular with the wounds on the right anterior lower leg. The patient has a large area on the right anterior lower leg that is oval-shaped and de-pigmented. He has several small open areas scattered throughout the surface of this. This does not look too much different from last time. It is difficult to get a history of him exactly how and when this happened he says it actually happened during the recent hospitalization however he certainly had this before that because we saw him 1 time in the clinic. He did has had a vein stripping in this area done remotely but I cannot really see the issue or the connection here. He does not describe a lot of pain 2/14; patient arrives in clinic today with a much better looking area on the right anterior lower leg. Only a few small open areas remaining what was a fully de- epithelialized area. I must say I had some concerns about this being a primary cutaneous problem when I first saw this with the major differential being chronic stasis dermatitis. He arrives in clinic today with the area almost completely epithelialized. I feel better about the idea of not biopsying this. He is going to need compression stockings and we spent some time talking about this perhaps [place] wraps. He has lymphedema in this area is certainly will not maintain skin integrity if the edema is not controlled. Surface is very adherent to underlying subcutaneous  tissue 2/21; he comes in today with a disappointing deterioration in this area. He has an oval-shaped hypopigmented area with multiple small areas of skin breakdown. I had some thoughts about the etiology of this when he first came in here however it looked as though this was closing with standard care for venous disease/stasis dermatitis. Certainly stasis dermatitis could cause pigmentary changes. The patient says that people have been working on this for many months and that it will close and then reopen. The patient states that this will often open with blisters although I have not seen anything that looked like this there is no evidence of infection 02/10/2021 upon evaluation today patient appears to be doing about the same in regard to his leg. Unfortunately he is still having a lot of drainage from the right leg. The left leg he has a compression wrap on at this point that is a juxta light. Fortunately there is no signs of active infection currently which is good news. I really feel like he may need stronger compression he apparently did just have some change in his fluid pill to something a little different. 3/14; patient comes in today with a cluster of small open areas in the middle of this deep pigmented area on his right anterior lower leg. He does not complain of pain. We increase the compression last week from 3-4 layer and he seems to have tolerated this satisfactorily. The cluster of open areas currently is not in the exact same place as when he first came  in. I am assuming that the depigmentation/hypopigmentation is secondary to chronic stasis dermatitis. In talking to the patient today he says the area of hypopigmentation has been there for about 2 years. He has a remote history of vein harvesting for a CABG 30 years ago according to the patient Objective Constitutional Patient is hypertensive.. Pulse regular and within target range for patient.Marland Kitchen Respirations regular, non-labored and  within target range.. Temperature is normal and within the target range for the patient.Marland Kitchen Appears in no distress. Vitals Time Taken: 3:12 PM, Height: 69 in, Weight: 190 lbs, BMI: 28.1, Temperature: 97.8 F, Pulse: 101 bpm, Respiratory Rate: 20 breaths/min, Blood Pressure: 164/90 mmHg, Capillary Blood Glucose: 85 mg/dl. General Notes: glucose per pt report Cardiovascular Pedal pulses palpable on the right. Edema control is reasonably good. General Notes: Wound exam; again several small areas in the upper lateral part of the large depigmented area on his right anterior lower leg there are a cluster of small open areas. None of the open areas appear to have a nonviable surface. The patient states that the areas will blister and then open but I have never really seen a blister. Integumentary (Hair, Skin) Wound #1 status is Open. Original cause of wound was Gradually Appeared. The date acquired was: 10/07/2020. The wound has been in treatment 8 weeks. The wound is located on the Right,Anterior Lower Leg. The wound measures 17.5cm length x 11.5cm width x 0.1cm depth; 158.061cm^2 area and 15.806cm^3 volume. There is Fat Layer (Subcutaneous Tissue) exposed. There is no tunneling or undermining noted. There is a medium amount of serosanguineous drainage noted. The wound margin is flat and intact. There is large (67-100%) red granulation within the wound bed. There is no necrotic tissue within the wound bed. Assessment Active Problems ICD-10 Lymphedema, not elsewhere classified Chronic venous hypertension (idiopathic) with ulcer and inflammation of right lower extremity Non-pressure chronic ulcer of other part of right lower leg limited to breakdown of skin Procedures Wound #1 Pre-procedure diagnosis of Wound #1 is a Venous Leg Ulcer located on the Right,Anterior Lower Leg . There was a Four Layer Compression Therapy Procedure by Levan Hurst, RN. Post procedure Diagnosis Wound #1: Same as  Pre-Procedure Plan Follow-up Appointments: Return Appointment in 2 weeks. Bathing/ Shower/ Hygiene: May shower with protection but do not get wound dressing(s) wet. Edema Control - Lymphedema / SCD / Other: Elevate legs to the level of the heart or above for 30 minutes daily and/or when sitting, a frequency of: - throughout the day 3-4 times a day. Avoid standing for long periods of time. Patient to wear own compression stockings every day. - Apply compression stocking to left leg in the morning and remove at night. Apply lotion every night after removing stocking. Exercise regularly Home Health: New wound care orders this week; continue Home Health for wound care. May utilize formulary equivalent dressing for wound treatment orders unless otherwise specified. - Change primary dressing to Iodoflex or Iodosorb to open areas only. Dressing changes to be completed by [place] on Monday / Wednesday / Friday except when patient has scheduled visit at [facility]. Other Home Health Orders/Instructions: - [facility] twice a week WOUND #1: - Lower Leg Wound Laterality: Right, Anterior Cleanser: Soap and Water ([place]) 3 x Per Week/30 Days Discharge Instructions: Home Health to wash leg with soap and water with dressing changes. May shower and wash wound with dial antibacterial soap and water prior to dressing change. Peri-Wound Care: Zinc Oxide Ointment 30g tube (Home  Health) 3 x Per Week/30 Days Discharge Instructions: Apply Zinc Oxide as needed for maceration, wetness to periwound with each dressing change Peri-Wound Care: Sween Lotion (Moisturizing lotion) (Home Health) 3 x Per Week/30 Days Discharge Instructions: home health to Apply moisturizing lotion as directed Topical: Triamcinolone ([place]) 3 x Per Week/30 Days Discharge Instructions: Apply Triamcinolone liberally on wound bed Prim Dressing: IODOFLEX 0.9% Cadexomer Iodine Pad 4x6 cm 3 x Per Week/30 Days ary Discharge  Instructions: Or Iodosorb for [facility] Secondary Dressing: Woven Gauze Sponge, Non-Sterile 4x4 in (Home Health) 3 x Per Week/30 Days Discharge Instructions: Apply over primary dressing as directed. Secondary Dressing: ABD Pad, 8x10 (Home Health) 3 x Per Week/30 Days Discharge Instructions: Apply over primary dressing as directed. Com pression Wrap: FourPress (4 layer compression wrap) (Home Health) 3 x Per Week/30 Days Discharge Instructions: Apply four layer compression as directed. 1. I change the primary dressing to Iodoflex 2. Still under 4-layer compression. 3. Still using triamcinolone liberally on the wound bed and surrounding skino Stasis Electronic Signature(s) Signed: 02/17/2021 5:58:13 PM By: Linton Ham MD Entered By: Linton Ham on 02/17/2021 15:38:16 -------------------------------------------------------------------------------- SuperBill Details Patient Name: Date of Service: [name], [name] 02/17/2021 Medical Record Number: [id number] Patient Account Number: [id number] Date of Birth/Sex: Treating RN: 1932-02-18 (87 y.o. [name] Primary Care Provider: [name] Provider: Treating Provider/Extender: Bebe Shaggy in Treatment: 8 Diagnosis Coding ICD-10 Codes Code Description I89.0 Lymphedema, not elsewhere classified I87.331 Chronic venous hypertension (idiopathic) with ulcer and inflammation of right lower extremity L97.811 Non-pressure chronic ulcer of other part of right lower leg limited to breakdown of skin Facility Procedures CPT4 Code: [id number] Description: (Facility Use Only) [id number] - [facility] [place] [facility] LWR RT LEG ICD-10 Diagnosis Description I87.331 Chronic venous hypertension (idiopathic) with ulcer and inflammation of right lower I89.0 Lymphedema, not elsewhere classified Modifier: extremity Quantity: 1 Physician Procedures : CPT4 Code Description Modifier [id number] - WC PHYS LEVEL 3 - EST  PT ICD-10 Diagnosis Description I89.0 Lymphedema, not elsewhere classified I87.331 Chronic venous hypertension (idiopathic) with ulcer and inflammation of right lower extremity  L97.811 Non-pressure chronic ulcer of other part of right lower leg limited to breakdown of skin Quantity: 1 Electronic Signature(s) Signed: 02/17/2021 5:58:13 PM By: Linton Ham MD Entered By: Linton Ham on 02/17/2021 15:38:40

## 2021-02-17 NOTE — Progress Notes (Signed)
[name], [name] ([id number]) Visit Report for 02/17/2021 Arrival Information Details Patient Name: Date of Service: [place], [name] 02/17/2021 3:30 PM Medical Record Number: [id number] Patient Account Number: [id number] Date of Birth/Sex: Treating RN: Sep 30, 1932 (87 y.o. [name] Primary Care [name]: [name] Other Clinician: Referring [name]: Treating [name]/Extender: [name] in Treatment: 8 Visit Information History Since Last Visit Added or deleted any medications: No Patient Arrived: Ambulatory Any new allergies or adverse reactions: No Arrival Time: 15:12 Had a fall or experienced change in No Accompanied By: son activities of daily living that may affect Transfer Assistance: None risk of falls: Patient Identification Verified: Yes Signs or symptoms of abuse/neglect since last visito No Secondary Verification Process Completed: Yes Hospitalized since last visit: No Patient Requires Transmission-Based Precautions: No Implantable device outside of the clinic excluding No Patient Has Alerts: Yes cellular tissue based products placed in the center Patient Alerts: Patient on Blood Thinner since last visit: Has Dressing in Place as Prescribed: Yes Pain Present Now: No Electronic Signature(s) Signed: 02/17/2021 6:16:37 PM By: Joseph Hurst RN, BSN Entered By: [name] on 02/17/2021 15:12:36 -------------------------------------------------------------------------------- Compression Therapy Details Patient Name: Date of Service: [name], Joseph BERT A. 02/17/2021 3:30 PM Medical Record Number: [id number] Patient Account Number: [id number] Date of Birth/Sex: Treating RN: Aug 12, 1932 (87 y.o. [name] Primary Care Tyliyah Mcmeekin: [name] Other Clinician: Referring [name]: Treating [name]/Extender: [name] in Treatment: 8 Compression Therapy Performed for Wound Assessment: Wound #1 Right,Anterior Lower  Leg Performed By: Clinician Joseph Hurst, RN Compression Type: Four Layer Post Procedure Diagnosis Same as Pre-procedure Electronic Signature(s) Signed: 02/17/2021 5:53:54 PM By: [name] Entered By: [name] on 02/17/2021 15:30:41 -------------------------------------------------------------------------------- Encounter Discharge Information Details Patient Name: Date of Service: [place], Joseph BERT A. 02/17/2021 3:30 PM Medical Record Number: [id number] Patient Account Number: [id number] Date of Birth/Sex: Treating RN: 17-Jan-1932 (87 y.o. [name] Primary Care Ilyana Manuele: [name] Other Clinician: Referring Safina Huard: Treating [name]/Extender: [name] in Treatment: 8 Encounter Discharge Information Items Discharge Condition: Stable Ambulatory Status: Ambulatory Discharge Destination: Home Transportation: Private Auto Accompanied By: son Schedule Follow-up Appointment: Yes Clinical Summary of Care: Electronic Signature(s) Signed: 02/17/2021 6:12:14 PM By: [name] Entered By: [name] on 02/17/2021 16:18:12 -------------------------------------------------------------------------------- Lower Extremity Assessment Details Patient Name: Date of Service: [place], [name] 02/17/2021 3:30 PM Medical Record Number: [id number] Patient Account Number: [id number] Date of Birth/Sex: Treating RN: [birth year] (87 y.o. [name] Primary Care [name]: [name] Other Clinician: Referring [name]: Treating [name]/Extender: [name] in Treatment: 8 Edema Assessment Assessed: [name]: No] [name]: No] Edema: [Left: Ye] [Right: s] Calf Left: Right: Point of Measurement: From Medial Instep 38.5 cm Ankle Left: Right: Point of Measurement: From Medial Instep 20.5 cm Vascular Assessment Pulses: Dorsalis Pedis Palpable: [Right:Yes] Electronic Signature(s) Signed: 02/17/2021 6:16:37 PM By: Joseph Hurst RN, BSN Entered By: [name] on 02/17/2021 15:20:34 -------------------------------------------------------------------------------- Multi Wound Chart Details Patient Name: Date of Service: [name], Joseph BERT A. 02/17/2021 3:30 PM Medical Record Number: [id number] Patient Account Number: [id number] Date of Birth/Sex: Treating RN: 1932-06-21 (87 y.o. [name] Primary Care [name]: [name] Other Clinician: Referring [name]: Treating [name]/Extender: [name] in Treatment: 8 Vital Signs Height(in): 98 Capillary Blood Glucose(mg/dl): 85 Weight(lbs): 190 Pulse(bpm): 101 Body Mass Index(BMI): 28 Blood Pressure(mmHg): 164/90 Temperature(F): 97.8 Respiratory Rate(breaths/min): 20 Photos: [1:No Photos Right, Anterior Lower Leg] [N/A:N/A N/A]  Wound Location: [1:Gradually Appeared] [N/A:N/A] Wounding Event: [1:Venous Leg Ulcer] [N/A:N/A] Primary Etiology: [1:Chronic Obstructive Pulmonary] [N/A:N/A] Comorbid History: [1:Disease (COPD), Sleep Apnea, Arrhythmia, Congestive Heart Failure, Hypertension, Myocardial Infarction, Peripheral Venous Disease, Type II Diabetes 10/07/2020] [N/A:N/A] Date Acquired: [1:8] [N/A:N/A] Weeks of Treatment: [1:Open] [N/A:N/A] Wound Status: [1:Yes] [N/A:N/A] Clustered Wound: [1:10] [N/A:N/A] Clustered Quantity: [1:17.5x11.5x0.1] [N/A:N/A] Measurements L x W x D (cm) [1:158.061] [N/A:N/A] A (cm) : rea [1:15.806] [N/A:N/A] Volume (cm) : [1:-161.40%] [N/A:N/A] % Reduction in Area: [1:-161.30%] [N/A:N/A] % Reduction in Volume: [1:Full Thickness Without Exposed] [N/A:N/A] Classification: [1:Support Structures Medium] [N/A:N/A] Exudate Amount: [1:Serosanguineous] [N/A:N/A] Exudate Type: [1:red, brown] [N/A:N/A] Exudate Color: [1:Flat and Intact] [N/A:N/A] Wound Margin: [1:Large (67-100%)] [N/A:N/A] Granulation Amount: [1:Red] [N/A:N/A] Granulation Quality: [1:None Present (0%)] [N/A:N/A] Necrotic  Amount: [1:Fat Layer (Subcutaneous Tissue): Yes N/A] Exposed Structures: [1:Fascia: No Tendon: No Muscle: No Joint: No Bone: No Medium (34-66%)] [N/A:N/A] Epithelialization: [1:Compression Therapy] [N/A:N/A] Treatment Notes Electronic Signature(s) Signed: 02/17/2021 5:58:13 PM By: Linton Ham MD Signed: 02/17/2021 6:16:37 PM By: Joseph Hurst RN, BSN Entered By: Linton Ham on 02/17/2021 15:33:52 -------------------------------------------------------------------------------- [place] Details Patient Name: Date of Service: [place], [place] BERT A. 02/17/2021 3:30 PM Medical Record Number: [id number] Patient Account Number: [id number] Date of Birth/Sex: Treating RN: 1932-02-19 (87 y.o. [name] Primary Care Breda Bond: [name] Other Clinician: Referring Nima Kemppainen: Treating Brina Umeda/Extender: [name] in Treatment: 8 Active Inactive Nutrition Nursing Diagnoses: Potential for alteratiion in Nutrition/Potential for imbalanced nutrition Goals: Patient/caregiver agrees to and verbalizes understanding of need to obtain nutritional consultation Date Initiated: 12/19/2020 Date Inactivated: 01/13/2021 Target Resolution Date: 01/10/2021 Goal Status: Met Patient/caregiver will maintain therapeutic glucose control Date Initiated: 12/19/2020 Target Resolution Date: 03/24/2021 Goal Status: Active Interventions: Assess HgA1c results as ordered upon admission and as needed Provide education on elevated blood sugars and impact on wound healing Provide education on nutrition Treatment Activities: Education provided on Nutrition : 01/13/2021 Obtain HgA1c : 12/19/2020 Notes: 02/17/21: Glucose control ongoing. Wound/Skin Impairment Nursing Diagnoses: Knowledge deficit related to ulceration/compromised skin integrity Goals: Patient/caregiver will verbalize understanding of skin care regimen Date Initiated: 12/19/2020 Target Resolution Date:  03/24/2021 Goal Status: Active Interventions: Assess patient/caregiver ability to obtain necessary supplies Assess patient/caregiver ability to perform ulcer/skin care regimen upon admission and as needed Provide education on ulcer and skin care Treatment Activities: Skin care regimen initiated : 12/19/2020 Topical wound management initiated : 12/19/2020 Notes: Electronic Signature(s) Signed: 02/17/2021 3:12:53 PM By: [name] Signed: 02/17/2021 6:16:37 PM By: Joseph Hurst RN, BSN Entered By: [name] on 02/17/2021 15:12:53 -------------------------------------------------------------------------------- Pain Assessment Details Patient Name: Date of Service: [name], Joseph BERT A. 02/17/2021 3:30 PM Medical Record Number: [id number] Patient Account Number: [id number] Date of Birth/Sex: Treating RN: 04/22/1932 (87 y.o. [name] Primary Care Yerlin Gasparyan: [name] Other Clinician: Referring Thomson Herbers: Treating Kahlel Peake/Extender: [name] in Treatment: 8 Active Problems Location of Pain Severity and Description of Pain Patient Has Paino No Patient Has Paino No Site Locations Pain Management and Medication Current Pain Management: Electronic Signature(s) Signed: 02/17/2021 6:16:37 PM By: Joseph Hurst RN, BSN Entered By: [name] on 02/17/2021 15:13:06 -------------------------------------------------------------------------------- Patient/Caregiver Education Details Patient Name: Date of Service: [name], [place] 3/14/2022andnbsp3:30 PM Medical Record Number: [id number] Patient Account Number: [id number] Date of Birth/Gender: Treating RN: 04/29/1932 (88 y.o. [name] Primary Care Physician: [name] Other Clinician: Referring Physician: Treating Physician/Extender: [name] in Treatment: 8 Education Assessment Education Provided To: Patient and Caregiver  Education Topics Provided Elevated Blood  Sugar/ Impact on Healing: Methods: Explain/Verbal, Printed Responses: State content correctly Venous: Methods: Explain/Verbal, Printed Responses: State content correctly Wound/Skin Impairment: Methods: Explain/Verbal, Printed Responses: State content correctly Electronic Signature(s) Signed: 02/17/2021 5:53:54 PM By: [name] Entered By: [name] on 02/17/2021 15:14:06 -------------------------------------------------------------------------------- Wound Assessment Details Patient Name: Date of Service: [name], Joseph BERT A. 02/17/2021 3:30 PM Medical Record Number: [id number] Patient Account Number: [id number] Date of Birth/Sex: Treating RN: 1932-09-30 (87 y.o. [name] Primary Care Rosalie Gelpi: [name] Other Clinician: Referring Zenda Herskowitz: Treating Chrysten Woulfe/Extender: [name] in Treatment: 8 Wound Status Wound Number: 1 Primary Venous Leg Ulcer Etiology: Wound Location: Right, Anterior Lower Leg Wound Open Wounding Event: Gradually Appeared Status: Date Acquired: 10/07/2020 Comorbid Chronic Obstructive Pulmonary Disease (COPD), Sleep Apnea, Weeks Of Treatment: 8 History: Arrhythmia, Congestive Heart Failure, Hypertension, Myocardial Clustered Wound: Yes Infarction, Peripheral Venous Disease, Type II Diabetes Photos Wound Measurements Length: (cm) 17.5 Width: (cm) 11.5 Depth: (cm) 0.1 Clustered Quantity: 10 Area: (cm) 158.061 Volume: (cm) 15.806 % Reduction in Area: -161.4% % Reduction in Volume: -161.3% Epithelialization: Medium (34-66%) Tunneling: No Undermining: No Wound Description Classification: Full Thickness Without Exposed Support Structures Wound Margin: Flat and Intact Exudate Amount: Medium Exudate Type: Serosanguineous Exudate Color: red, brown Foul Odor After Cleansing: No Slough/Fibrino No Wound Bed Granulation Amount: Large (67-100%) Exposed Structure Granulation Quality: Red Fascia Exposed:  No Necrotic Amount: None Present (0%) Fat Layer (Subcutaneous Tissue) Exposed: Yes Tendon Exposed: No Muscle Exposed: No Joint Exposed: No Bone Exposed: No Treatment Notes Wound #1 (Lower Leg) Wound Laterality: Right, Anterior Cleanser Soap and Water Discharge Instruction: Home Health to wash leg with soap and water with dressing changes. May shower and wash wound with dial antibacterial soap and water prior to dressing change. Peri-Wound Care Zinc Oxide Ointment 30g tube Discharge Instruction: Apply Zinc Oxide as needed for maceration, wetness to periwound with each dressing change Sween Lotion (Moisturizing lotion) Discharge Instruction: home health to Apply moisturizing lotion as directed Topical Triamcinolone Discharge Instruction: Apply Triamcinolone liberally on wound bed Primary Dressing IODOFLEX 0.9% Cadexomer Iodine Pad 4x6 cm Discharge Instruction: Or Iodosorb for [facility] Secondary Dressing Woven Gauze Sponge, Non-Sterile 4x4 in Discharge Instruction: Apply over primary dressing as directed. ABD Pad, 8x10 Discharge Instruction: Apply over primary dressing as directed. Secured With Compression Wrap FourPress (4 layer compression wrap) Discharge Instruction: Apply four layer compression as directed. Compression Stockings Add-Ons Electronic Signature(s) Signed: 02/17/2021 5:39:15 PM By: Sandre Kitty Signed: 02/17/2021 6:16:37 PM By: Joseph Hurst RN, BSN Entered By: Sandre Kitty on 02/17/2021 16:48:33 -------------------------------------------------------------------------------- [place] Details Patient Name: Date of Service: [name], Joseph BERT A. 02/17/2021 3:30 PM Medical Record Number: [id number] Patient Account Number: [id number] Date of Birth/Sex: Treating RN: 1932-07-15 (87 y.o. [name] Primary Care Talulah Schirmer: [name] Other Clinician: Referring Aolanis Crispen: Treating Delbert Vu/Extender: [name] in Treatment: 8 Vital Signs Time  Taken: 15:12 Temperature (F): 97.8 Height (in): 69 Pulse (bpm): 101 Weight (lbs): 190 Respiratory Rate (breaths/min): 20 Body Mass Index (BMI): 28.1 Blood Pressure (mmHg): 164/90 Capillary Blood Glucose (mg/dl): 85 Reference Range: 80 - 120 mg / dl Notes glucose per pt report Electronic Signature(s) Signed: 02/17/2021 6:16:37 PM By: Joseph Hurst RN, BSN Entered By: [name] on 02/17/2021 15:13:01

## 2021-02-18 ENCOUNTER — Other Ambulatory Visit: Payer: Self-pay | Admitting: *Deleted

## 2021-02-18 DIAGNOSIS — I272 Pulmonary hypertension, unspecified: Secondary | ICD-10-CM | POA: Diagnosis not present

## 2021-02-18 DIAGNOSIS — I130 Hypertensive heart and chronic kidney disease with heart failure and stage 1 through stage 4 chronic kidney disease, or unspecified chronic kidney disease: Secondary | ICD-10-CM | POA: Diagnosis not present

## 2021-02-18 DIAGNOSIS — U071 COVID-19: Secondary | ICD-10-CM | POA: Diagnosis not present

## 2021-02-18 DIAGNOSIS — J9621 Acute and chronic respiratory failure with hypoxia: Secondary | ICD-10-CM | POA: Diagnosis not present

## 2021-02-18 DIAGNOSIS — J9611 Chronic respiratory failure with hypoxia: Secondary | ICD-10-CM | POA: Diagnosis not present

## 2021-02-18 DIAGNOSIS — I5032 Chronic diastolic (congestive) heart failure: Secondary | ICD-10-CM | POA: Diagnosis not present

## 2021-02-18 DIAGNOSIS — G4733 Obstructive sleep apnea (adult) (pediatric): Secondary | ICD-10-CM | POA: Diagnosis not present

## 2021-02-18 DIAGNOSIS — I480 Paroxysmal atrial fibrillation: Secondary | ICD-10-CM | POA: Diagnosis not present

## 2021-02-18 NOTE — Patient Outreach (Signed)
[facility]) Care Management  [place]  02/18/2021   [name] 02-14-1932 [id number]   Telephone Assessment Initial Referral received : 08/15/20 Referral source:Hospital Liaision Referral reason: Complex care and disease management of Heart failure . Insurance: Medicare  [facility] Admission :1/20-1/27/22 Dx: Acute Hypoxemic respiratory failure - Covid 19, Acute on Chronic Heart failure .  PMHX;includes chronic diastolic heart failure, bilateral pleural effusions, chronic kidney disease stage 3,Hypertension , type 2 Diabetes, OSAwith CPAP,home oxygen ,COPD, venous stasis dermatitis,Dyslipidemia, pulmonary hypertension.   Subjective:  Patient states that he is doing okay on today. He denies report of sudden weight gain, swelling or worsening shortness of breath  over the last 3 days, reporting weight today is 195 on today, states that he did not get to weigh on yesterday he was busy.  He recalls prior visit with remote health after getting xray at home and getting medication in his arm to help get rid of fluid.  Patient reports wound clinic visit on yesterdays with report of decrease in swelling and improvement in wound at right leg. He report working on trying to keep his legs elevated when sitting and wearing support hose on left leg family assist with getting stocking on per report.   Patient continues to reports having episode of low blood sugar in the mornings, reports having episode of low blood sugar reading of less than 70 on this morning, treating with drinking orange juice and eating breakfast. He states that he thinks that he has had one episode over the last week. Marland Kitchen He discussed not eating as much since last hospital admission. Discussed supplements such as Glucerna to support nutrition. He discussed that his Lantus insulin is now down to 15 units at night and he does not take novolog unless his reading is higher. He denies  difficulty with dialing dose on insulin pen.   Objective:   Encounter Medications:  Outpatient Encounter Medications as of 02/18/2021  Medication Sig Note  . acetaminophen (TYLENOL) 325 MG tablet Take 650 mg by mouth every 6 (six) hours as needed (for pain.).   Marland Kitchen amLODipine (NORVASC) 5 MG tablet Take 5 mg by mouth daily.   Marland Kitchen apixaban (ELIQUIS) 2.5 MG TABS tablet Take 1 tablet (2.5 mg total) by mouth 2 (two) times daily.   . benazepril (LOTENSIN) 40 MG tablet Take 1 tablet (40 mg total) by mouth daily.   . cloNIDine (CATAPRES) 0.1 MG tablet Take 0.1 mg by mouth 2 (two) times daily.   Marland Kitchen dexamethasone (DECADRON) 6 MG tablet Take 1 tablet (6 mg total) by mouth daily.   Marland Kitchen ezetimibe (ZETIA) 10 MG tablet Take 10 mg by mouth daily.   . furosemide (LASIX) 80 MG tablet Take 1 tablet (80 mg total) by mouth 2 (two) times daily.   Marland Kitchen HUMALOG KWIKPEN 100 UNIT/ML KiwkPen Inject 6-8 Units into the skin 3 (three) times daily.    Marland Kitchen icosapent Ethyl (VASCEPA) 1 g capsule TAKE 2 CAPSULES(2 GRAMS) BY MOUTH TWICE DAILY (Patient taking differently: Take 2 g by mouth 2 (two) times daily.)   . LANTUS SOLOSTAR 100 UNIT/ML Solostar Pen Inject 15 Units into the skin every evening. 02/18/2021: Reports taking 15 units   . levothyroxine (SYNTHROID) 100 MCG tablet Take 100 mcg by mouth daily before breakfast.   . potassium chloride (KLOR-CON) 10 MEQ tablet Take 1 tablet (10 mEq total) by mouth 2 (two) times daily.   Marland Kitchen Propylene Glycol (SYSTANE BALANCE) 0.6 % SOLN Place 1  drop into both eyes daily as needed (dry eyes).   . rosuvastatin (CRESTOR) 20 MG tablet TAKE 1 TABLET BY MOUTH EVERY DAY    No facility-administered encounter medications on file as of 02/18/2021.    Functional Status:  In your present state of health, do you have any difficulty performing the following activities: 02/04/2021 12/26/2020  Hearing? [place]? Y -  Difficulty concentrating or making decisions? N -  Walking or climbing stairs? Y -  Comment  uses walker at times -  Dressing or bathing? N -  Doing errands, shopping? Y N  Comment family assist -  Conservation officer, nature and eating ? N -  Comment family helps -  Using the Toilet? N -  In the past six months, have you accidently leaked urine? N -  Do you have problems with loss of bowel control? N -  Managing your Medications? N -  Managing your Finances? N -  Comment familly helps -  Housekeeping or managing your Housekeeping? Y -  Comment family helps -  Some recent data might be hidden    Fall/Depression Screening: Fall Risk  01/21/2021 08/19/2020  Falls in the past year? 1 0  Number falls in past yr: 0 0  Injury with Fall? 0 0  Risk for fall due to : History of fall(s);Impaired mobility Impaired mobility  Follow up Falls evaluation completed;Education provided;Falls prevention discussed Falls evaluation completed;Education provided   [facility] 2/9 Scores 01/21/2021 08/19/2020  PHQ - 2 Score 0 -  Exception Documentation - Other- indicate reason in comment box  Not completed - Unable ask patient requested RNCM complete assessment with wife.    Assessment: Patient will continue benefit from ongoing care management  support of chronic conditions along with support of home health and Remote health services.  Goals Addressed            This Visit's Progress   . Improve My Nutrition with the Foods I Eat and Drink       Timeframe:  Short-Term Goal Priority:  Medium Start Date:    02/18/2021                         Expected End Date:                       Follow Up Date 04/05/21   - choose small, frequent meals - drink protein shakes (sipping is ok) - eat snacks that contain protein and dairy twice a day - include protein food, like egg, meat, chicken, peanut butter, beans and fish in each meal    Why is this important?    Eating 3 healthy meals, or 5 or 6 small meals, a day is very important.   A healthy diet is one that has fruit, vegetables, protein, good fats, whole grains  and low-fat dairy products.   Be sure to have plenty of protein that builds strong muscles. Many older people do not get quite enough protein.     Notes: Reviewed importance of protein nutrition in diet to help with increasing strength, healing of wounds, discussed increased energy used with COPD condition. Discussed nutrition supplements to have between meals. Will send coupons for Glucerna encouraged to have support with purchasing.     . Make and Keep All Appointments   Not on track    Follow Up Date 4/31/22 Timeframe:  Long-Range Goal Priority:  Medium Start Date:   10/18/20  Expected End Date:  05/06/21                      - call to cancel if needed - keep a calendar with appointment dates    Why is this important?   Part of staying healthy is seeing the doctor for follow-up care.  If you forget your appointments, there are some things you can do to stay on track.    Notes:  Reviewed missed appointment with Dr. Carrie Mew with call to office to reschedule     . Monitor and Manage My Blood Sugar-Diabetes Type 2       Timeframe:  Long-Range Goal Priority:  Medium Start Date:  02/18/21                           Expected End Date:  06/05/21                     Follow Up Date 4/302022    - check blood sugar at prescribed times - take the blood sugar meter to all doctor visits    Why is this important?    Checking your blood sugar at home helps to keep it from getting very high or very low.   Writing the results in a diary or log helps the doctor know how to care for you.   Your blood sugar log should have the time, date and the results.   Also, write down the amount of insulin or other medicine that you take.   Other information, like what you ate, exercise done and how you were feeling, will also be helpful.     Notes: Reinforced checking blood sugars 3 to 4 times daily, reviewed recent blood sugar reading and current insulin schedule.  Reinforced balanced nutrition eating regular meals during the day. Review signs symptoms of low blood sugar as well as steps to treating hypoglycemia.     . Track and Manage Activity and Exertion       Follow Up Date 04/05/21 Timeframe:  Long-Range Goal Priority:  Medium Start Date:     10/18/20                        Expected End Date:  04/3130/22                      - follow activity or exercise plan - pace activity allowing for rest    Why is this important?   Exercising is very important when managing your heart failure.  It will help your heart get stronger.    Notes:  Encouraged using cane/walker for safety, wearing oxygen as prescribed, encouraged rest breaks between activity     . Track and Manage Fluids and Swelling   On track    Follow Up Date 04/05/21 Timeframe:  Long-Range Goal Priority:  High Start Date:    10/18/20                         Expected End Date:   05/06/21                     - use salt in moderation - watch for swelling in feet, ankles and legs every day - weigh myself daily    Why is this important?   It is important to check your  weight daily and watch how much salt and liquids you have.  It will help you to manage your heart failure.   Note:Encouraged keeping legs elevated above level of heart to help reduce swelling in lower legs increase circulation promote healing of wound right leg, discussed how to elevate legs to help with decreasing swelling , commended patient on working on elevating legs when sitting, and wearing support hose.      . Track and Manage My Blood Pressure   Not on track    Follow Up Date 03/06/21 Timeframe:  Long-Range Goal Priority:  Medium Start Date:   10/18/20                          Expected End Date:   05/06/21                    - check blood pressure weekly    Why is this important?   You won't feel high blood pressure, but it can still hurt your blood vessels.  High blood pressure can cause heart or kidney  problems. It can also cause a stroke.  Making lifestyle changes like losing a little weight or eating less salt will help.  Checking your blood pressure at home and at different times of the day can help to control blood pressure.  If the doctor prescribes medicine remember to take it the way the doctor ordered.  Call the office if you cannot afford the medicine or if there are questions about it.     Notes:patient not monitoring , " everybody coming in checks it"    . Track and Manage Symptoms   On track    Follow Up Date 03/06/21 Timeframe:  Long-Range Goal Priority:  High Start Date:    10/18/20                         Expected End Date:  05/06/21                      - know when to call the doctor - track symptoms and what helps feel better or worse  Reinforced notifying remote health regarding worsening symptoms    Why is this important?   You will be able to handle your symptoms better if you keep track of them.  Making some simple changes to your lifestyle will help.  Eating healthy is one thing you can do to take good care of yourself.    Notes:  Reinforced monitoring weights daily,discussed today's weight of 195, denies worsening symptoms, increased swelling or shortness of breath         Plan:  Follow-up:  Patient agrees to Care Plan and Follow-up. Patient agreeable to return call in the next 2 weeks.  Care collaboration call to Anissa with remote health, she discussed patient with changes in Lantus dose to 15 units after call to provider. She voiced concern regarding patient cogniition since recent admission ,as well as concern for patient management of medications, he does not use not use a pill organizer continues to manage his medications.  Will place pharmacy referral for medication management concerns and for medication review.   Joylene Draft, RN, BSN  [place] Management Coordinator  [phone]- Mobile [phone]- Toll Free Main  Office

## 2021-02-18 NOTE — Patient Outreach (Signed)
[facility]) Care Management  02/18/2021  KAISER BELLUOMINI Jul 16, 1932 [id number]  Referral for medication assistance from Joylene Draft, RN sent to [place].  Ina Homes [facility] Management Assistant [phone]

## 2021-02-18 NOTE — Patient Instructions (Signed)
Hypoglycemia Hypoglycemia is when the sugar (glucose) level in your blood is too low. Low blood sugar can happen to people who have diabetes and people who do not have diabetes. Low blood sugar can happen quickly, and it can be an emergency. What are the causes? This condition happens most often in people who have diabetes and may be caused by:  Diabetes medicine.  Not eating enough, or not eating often enough.  Doing more physical activity.  Drinking alcohol on an empty stomach. If you do not have diabetes, hypoglycemia may be caused by:  A tumor in the pancreas.  Not eating enough, or not eating for long periods at a time (fasting).  A very bad infection or illness.  Problems after having weight loss (bariatric) surgery.  Kidney failure or liver failure.  Certain medicines. What increases the risk? This condition is more likely to develop in people who:  Have diabetes and take medicines to lower their blood sugar.  Abuse alcohol.  Have a very bad illness. What are the signs or symptoms? Symptoms depend on whether your low blood sugar is mild, moderate, or very low. Mild  Hunger.  Feeling worried or nervous (anxious).  Sweating and feeling clammy.  Feeling dizzy or light-headed.  Being sleepy or having trouble sleeping.  Feeling like you may vomit (nauseous).  A fast heartbeat.  A headache.  Blurry vision.  Being irritable or grouchy.  Tingling or loss of feeling (numbness) around your mouth, lips, or tongue.  Trouble with moving (coordination). Moderate  Confusion and poor judgment.  Behavior changes.  Weakness.  Uneven heartbeats. Very low Very low blood sugar (severe hypoglycemia) is a medical emergency. It can cause:  Fainting.  Jerky movements that you cannot control (seizure).  Loss of consciousness (coma).  Death. How is this treated? Treating low blood sugar Low blood sugar is often treated by eating or drinking something  sugary right away. The snack should contain 15 grams of a fast-acting carb (carbohydrate). Options include:  4 oz (120 mL) of fruit juice.  4-6 oz (120-150 mL) of regular soda (not diet soda).  8 oz (240 mL) of low-fat milk.  Several pieces of hard candy. Check food labels to find out how many to eat for 15 grams.  1 Tbsp (15 mL) of sugar or honey. Treating low blood sugar if you have diabetes If you can think clearly and swallow safely, follow the 15:15 rule:  Take 15 grams of a fast-acting carb. Talk with your doctor about how much you should take.  Always keep a source of fast-acting carb with you, such as: ? Sugar tablets (glucose pills). Take 4 pills. ? Several pieces of hard candy. Check food labels to see how many pieces to eat for 15 grams. ? 4 oz (120 mL) of fruit juice. ? 4-6 oz (120-150 mL) of regular (not diet) soda. ? 1 Tbsp (15 mL) of honey or sugar.  Check your blood sugar 15 minutes after you take the carb.  If your blood sugar is still at or below 70 mg/dL (3.9 mmol/L), take 15 grams of a carb again.  If your blood sugar does not go above 70 mg/dL (3.9 mmol/L) after 3 tries, get help right away.  After your blood sugar goes back to normal, eat a meal or a snack within 1 hour.   Treating very low blood sugar If your blood sugar is at or below 54 mg/dL (3 mmol/L), you have very low blood sugar, or severe   hypoglycemia. This is an emergency. Get medical help right away. If you have very low blood sugar and you cannot eat or drink, you will need to be given a hormone called glucagon. A family member or friend should learn how to check your blood sugar and how to give you glucagon. Ask your doctor if you need to have an emergency glucagon kit at home. Very low blood sugar may also need to be treated in a hospital. Follow these instructions at home: General instructions  Take over-the-counter and prescription medicines only as told by your doctor.  Stay aware of your  blood sugar as told by your doctor.  If you drink alcohol: ? Limit how much you use to:  0-1 drink a day for nonpregnant women.  0-2 drinks a day for men. ? Be aware of how much alcohol is in your drink. In the [place] one drink equals one 12 oz bottle of beer (355 mL), one 5 oz glass of wine (148 mL), or one 1 oz glass of hard liquor (44 mL).  Keep all follow-up visits as told by your doctor. This is important. If you have diabetes:  Always have a rapid-acting carb (15 grams) option with you to treat low blood sugar.  Follow your diabetes care plan as told by your doctor. Make sure you: ? Know the symptoms of low blood sugar. ? Check your blood sugar as often as told by your doctor. Always check it before and after exercise. ? Always check your blood sugar before you drive. ? Take your medicines as told. ? Follow your meal plan. ? Eat on time. Do not skip meals.  Share your diabetes care plan with: ? Your work or school. ? People you live with.  Carry a card or wear jewelry that says you have diabetes.   Contact a doctor if:  You have trouble keeping your blood sugar in your target range.  You have low blood sugar often. Get help right away if:  You still have symptoms after you eat or drink something that contains 15 grams of fast-acting carb and you cannot get your blood sugar above 70 mg/dL by following the 15:15 rule.  Your blood sugar is at or below 54 mg/dL (3 mmol/L).  You have a seizure.  You faint. These symptoms may be an emergency. Do not wait to see if the symptoms will go away. Get medical help right away. Call your local emergency services (911 in the [place] Do not drive yourself to the hospital. Summary  Hypoglycemia happens when the level of sugar (glucose) in your blood is too low.  Low blood sugar can happen to people who have diabetes and people who do not have diabetes. Low blood sugar can happen quickly, and it can be an emergency.  Make sure you  know the symptoms of low blood sugar and know how to treat it.  Always keep a source of sugar (fast-acting carb) with you to treat low blood sugar. This information is not intended to replace advice given to you by your health care provider. Make sure you discuss any questions you have with your health care provider. Document Revised: 10/18/2019 Document Reviewed: 10/18/2019 Elsevier Patient Education  2021 [employer].

## 2021-02-19 DIAGNOSIS — I130 Hypertensive heart and chronic kidney disease with heart failure and stage 1 through stage 4 chronic kidney disease, or unspecified chronic kidney disease: Secondary | ICD-10-CM | POA: Diagnosis not present

## 2021-02-19 DIAGNOSIS — U071 COVID-19: Secondary | ICD-10-CM | POA: Diagnosis not present

## 2021-02-19 DIAGNOSIS — J9621 Acute and chronic respiratory failure with hypoxia: Secondary | ICD-10-CM | POA: Diagnosis not present

## 2021-02-19 DIAGNOSIS — I5033 Acute on chronic diastolic (congestive) heart failure: Secondary | ICD-10-CM | POA: Diagnosis not present

## 2021-02-19 DIAGNOSIS — E1122 Type 2 diabetes mellitus with diabetic chronic kidney disease: Secondary | ICD-10-CM | POA: Diagnosis not present

## 2021-02-19 DIAGNOSIS — I480 Paroxysmal atrial fibrillation: Secondary | ICD-10-CM | POA: Diagnosis not present

## 2021-02-20 DIAGNOSIS — I480 Paroxysmal atrial fibrillation: Secondary | ICD-10-CM | POA: Diagnosis not present

## 2021-02-20 DIAGNOSIS — J9621 Acute and chronic respiratory failure with hypoxia: Secondary | ICD-10-CM | POA: Diagnosis not present

## 2021-02-20 DIAGNOSIS — I130 Hypertensive heart and chronic kidney disease with heart failure and stage 1 through stage 4 chronic kidney disease, or unspecified chronic kidney disease: Secondary | ICD-10-CM | POA: Diagnosis not present

## 2021-02-20 DIAGNOSIS — U071 COVID-19: Secondary | ICD-10-CM | POA: Diagnosis not present

## 2021-02-26 ENCOUNTER — Other Ambulatory Visit: Payer: Self-pay

## 2021-02-26 ENCOUNTER — Emergency Department (HOSPITAL_COMMUNITY): Payer: Medicare Other

## 2021-02-26 ENCOUNTER — Inpatient Hospital Stay (HOSPITAL_COMMUNITY)
Admission: EM | Admit: 2021-02-26 | Discharge: 2021-03-07 | DRG: 291 | Disposition: E | Payer: Medicare Other | Attending: Pulmonary Disease | Admitting: Pulmonary Disease

## 2021-02-26 ENCOUNTER — Encounter (HOSPITAL_COMMUNITY): Payer: Self-pay

## 2021-02-26 DIAGNOSIS — E440 Moderate protein-calorie malnutrition: Secondary | ICD-10-CM | POA: Diagnosis not present

## 2021-02-26 DIAGNOSIS — J439 Emphysema, unspecified: Secondary | ICD-10-CM | POA: Diagnosis present

## 2021-02-26 DIAGNOSIS — E872 Acidosis: Secondary | ICD-10-CM | POA: Diagnosis not present

## 2021-02-26 DIAGNOSIS — I5033 Acute on chronic diastolic (congestive) heart failure: Secondary | ICD-10-CM | POA: Diagnosis not present

## 2021-02-26 DIAGNOSIS — I480 Paroxysmal atrial fibrillation: Secondary | ICD-10-CM | POA: Diagnosis present

## 2021-02-26 DIAGNOSIS — R0602 Shortness of breath: Secondary | ICD-10-CM

## 2021-02-26 DIAGNOSIS — J449 Chronic obstructive pulmonary disease, unspecified: Secondary | ICD-10-CM | POA: Diagnosis not present

## 2021-02-26 DIAGNOSIS — I2721 Secondary pulmonary arterial hypertension: Secondary | ICD-10-CM | POA: Diagnosis present

## 2021-02-26 DIAGNOSIS — I2510 Atherosclerotic heart disease of native coronary artery without angina pectoris: Secondary | ICD-10-CM | POA: Diagnosis not present

## 2021-02-26 DIAGNOSIS — J9621 Acute and chronic respiratory failure with hypoxia: Secondary | ICD-10-CM | POA: Diagnosis present

## 2021-02-26 DIAGNOSIS — I371 Nonrheumatic pulmonary valve insufficiency: Secondary | ICD-10-CM | POA: Diagnosis present

## 2021-02-26 DIAGNOSIS — R579 Shock, unspecified: Secondary | ICD-10-CM | POA: Diagnosis not present

## 2021-02-26 DIAGNOSIS — E039 Hypothyroidism, unspecified: Secondary | ICD-10-CM | POA: Diagnosis present

## 2021-02-26 DIAGNOSIS — I472 Ventricular tachycardia: Secondary | ICD-10-CM | POA: Diagnosis not present

## 2021-02-26 DIAGNOSIS — Z4659 Encounter for fitting and adjustment of other gastrointestinal appliance and device: Secondary | ICD-10-CM

## 2021-02-26 DIAGNOSIS — R0902 Hypoxemia: Secondary | ICD-10-CM

## 2021-02-26 DIAGNOSIS — Z905 Acquired absence of kidney: Secondary | ICD-10-CM

## 2021-02-26 DIAGNOSIS — Z95828 Presence of other vascular implants and grafts: Secondary | ICD-10-CM

## 2021-02-26 DIAGNOSIS — J90 Pleural effusion, not elsewhere classified: Secondary | ICD-10-CM | POA: Diagnosis not present

## 2021-02-26 DIAGNOSIS — I10 Essential (primary) hypertension: Secondary | ICD-10-CM | POA: Diagnosis present

## 2021-02-26 DIAGNOSIS — J811 Chronic pulmonary edema: Secondary | ICD-10-CM | POA: Diagnosis not present

## 2021-02-26 DIAGNOSIS — E785 Hyperlipidemia, unspecified: Secondary | ICD-10-CM | POA: Diagnosis present

## 2021-02-26 DIAGNOSIS — I4892 Unspecified atrial flutter: Secondary | ICD-10-CM | POA: Diagnosis not present

## 2021-02-26 DIAGNOSIS — I130 Hypertensive heart and chronic kidney disease with heart failure and stage 1 through stage 4 chronic kidney disease, or unspecified chronic kidney disease: Secondary | ICD-10-CM | POA: Diagnosis not present

## 2021-02-26 DIAGNOSIS — E8770 Fluid overload, unspecified: Secondary | ICD-10-CM | POA: Diagnosis not present

## 2021-02-26 DIAGNOSIS — I469 Cardiac arrest, cause unspecified: Secondary | ICD-10-CM | POA: Diagnosis not present

## 2021-02-26 DIAGNOSIS — I4891 Unspecified atrial fibrillation: Secondary | ICD-10-CM | POA: Diagnosis present

## 2021-02-26 DIAGNOSIS — I252 Old myocardial infarction: Secondary | ICD-10-CM

## 2021-02-26 DIAGNOSIS — I509 Heart failure, unspecified: Secondary | ICD-10-CM

## 2021-02-26 DIAGNOSIS — Z978 Presence of other specified devices: Secondary | ICD-10-CM

## 2021-02-26 DIAGNOSIS — I517 Cardiomegaly: Secondary | ICD-10-CM | POA: Diagnosis not present

## 2021-02-26 DIAGNOSIS — M7989 Other specified soft tissue disorders: Secondary | ICD-10-CM | POA: Diagnosis present

## 2021-02-26 DIAGNOSIS — Z515 Encounter for palliative care: Secondary | ICD-10-CM

## 2021-02-26 DIAGNOSIS — Z951 Presence of aortocoronary bypass graft: Secondary | ICD-10-CM

## 2021-02-26 DIAGNOSIS — N184 Chronic kidney disease, stage 4 (severe): Secondary | ICD-10-CM | POA: Diagnosis not present

## 2021-02-26 DIAGNOSIS — Z8701 Personal history of pneumonia (recurrent): Secondary | ICD-10-CM

## 2021-02-26 DIAGNOSIS — D631 Anemia in chronic kidney disease: Secondary | ICD-10-CM | POA: Diagnosis present

## 2021-02-26 DIAGNOSIS — I5023 Acute on chronic systolic (congestive) heart failure: Secondary | ICD-10-CM | POA: Diagnosis not present

## 2021-02-26 DIAGNOSIS — N179 Acute kidney failure, unspecified: Secondary | ICD-10-CM | POA: Diagnosis present

## 2021-02-26 DIAGNOSIS — I110 Hypertensive heart disease with heart failure: Secondary | ICD-10-CM | POA: Diagnosis not present

## 2021-02-26 DIAGNOSIS — Z79899 Other long term (current) drug therapy: Secondary | ICD-10-CM

## 2021-02-26 DIAGNOSIS — E1122 Type 2 diabetes mellitus with diabetic chronic kidney disease: Secondary | ICD-10-CM | POA: Diagnosis present

## 2021-02-26 DIAGNOSIS — J690 Pneumonitis due to inhalation of food and vomit: Secondary | ICD-10-CM | POA: Diagnosis not present

## 2021-02-26 DIAGNOSIS — Z4682 Encounter for fitting and adjustment of non-vascular catheter: Secondary | ICD-10-CM | POA: Diagnosis not present

## 2021-02-26 DIAGNOSIS — Z79890 Hormone replacement therapy (postmenopausal): Secondary | ICD-10-CM

## 2021-02-26 DIAGNOSIS — I272 Pulmonary hypertension, unspecified: Secondary | ICD-10-CM | POA: Diagnosis not present

## 2021-02-26 DIAGNOSIS — Z9981 Dependence on supplemental oxygen: Secondary | ICD-10-CM

## 2021-02-26 DIAGNOSIS — Z20822 Contact with and (suspected) exposure to covid-19: Secondary | ICD-10-CM | POA: Diagnosis present

## 2021-02-26 DIAGNOSIS — Z66 Do not resuscitate: Secondary | ICD-10-CM | POA: Diagnosis not present

## 2021-02-26 DIAGNOSIS — I248 Other forms of acute ischemic heart disease: Secondary | ICD-10-CM | POA: Diagnosis not present

## 2021-02-26 DIAGNOSIS — J9601 Acute respiratory failure with hypoxia: Secondary | ICD-10-CM

## 2021-02-26 DIAGNOSIS — Z833 Family history of diabetes mellitus: Secondary | ICD-10-CM

## 2021-02-26 DIAGNOSIS — G4733 Obstructive sleep apnea (adult) (pediatric): Secondary | ICD-10-CM | POA: Diagnosis present

## 2021-02-26 DIAGNOSIS — N2889 Other specified disorders of kidney and ureter: Secondary | ICD-10-CM | POA: Diagnosis present

## 2021-02-26 DIAGNOSIS — R9431 Abnormal electrocardiogram [ECG] [EKG]: Secondary | ICD-10-CM

## 2021-02-26 DIAGNOSIS — Z452 Encounter for adjustment and management of vascular access device: Secondary | ICD-10-CM

## 2021-02-26 DIAGNOSIS — I213 ST elevation (STEMI) myocardial infarction of unspecified site: Secondary | ICD-10-CM | POA: Diagnosis present

## 2021-02-26 DIAGNOSIS — Z794 Long term (current) use of insulin: Secondary | ICD-10-CM

## 2021-02-26 DIAGNOSIS — Z8616 Personal history of COVID-19: Secondary | ICD-10-CM | POA: Diagnosis not present

## 2021-02-26 DIAGNOSIS — Z87891 Personal history of nicotine dependence: Secondary | ICD-10-CM | POA: Diagnosis not present

## 2021-02-26 DIAGNOSIS — Z7901 Long term (current) use of anticoagulants: Secondary | ICD-10-CM

## 2021-02-26 DIAGNOSIS — E1159 Type 2 diabetes mellitus with other circulatory complications: Secondary | ICD-10-CM | POA: Diagnosis not present

## 2021-02-26 DIAGNOSIS — E11649 Type 2 diabetes mellitus with hypoglycemia without coma: Secondary | ICD-10-CM | POA: Diagnosis present

## 2021-02-26 DIAGNOSIS — U071 COVID-19: Secondary | ICD-10-CM | POA: Diagnosis not present

## 2021-02-26 DIAGNOSIS — J9690 Respiratory failure, unspecified, unspecified whether with hypoxia or hypercapnia: Secondary | ICD-10-CM | POA: Diagnosis not present

## 2021-02-26 LAB — BASIC METABOLIC PANEL
Anion gap: 8 (ref 5–15)
BUN: 24 mg/dL — ABNORMAL HIGH (ref 8–23)
CO2: 29 mmol/L (ref 22–32)
Calcium: 8.8 mg/dL — ABNORMAL LOW (ref 8.9–10.3)
Chloride: 104 mmol/L (ref 98–111)
Creatinine, Ser: 1.61 mg/dL — ABNORMAL HIGH (ref 0.61–1.24)
GFR, Estimated: 41 mL/min — ABNORMAL LOW (ref >60)
Glucose, Bld: 107 mg/dL — ABNORMAL HIGH (ref 70–99)
Potassium: 4 mmol/L (ref 3.5–5.1)
Sodium: 141 mmol/L (ref 135–145)

## 2021-02-26 LAB — CBC WITH DIFFERENTIAL/PLATELET
Abs Immature Granulocytes: 0.04 10*3/uL (ref 0.00–0.07)
Basophils Absolute: 0 10*3/uL (ref 0.0–0.1)
Basophils Relative: 1 %
Eosinophils Absolute: 0.2 10*3/uL (ref 0.0–0.5)
Eosinophils Relative: 2 %
HCT: 43.2 % (ref 39.0–52.0)
Hemoglobin: 13.3 g/dL (ref 13.0–17.0)
Immature Granulocytes: 1 %
Lymphocytes Relative: 18 %
Lymphs Abs: 1.6 10*3/uL (ref 0.7–4.0)
MCH: 27.8 pg (ref 26.0–34.0)
MCHC: 30.8 g/dL (ref 30.0–36.0)
MCV: 90.2 fL (ref 80.0–100.0)
Monocytes Absolute: 0.8 10*3/uL (ref 0.1–1.0)
Monocytes Relative: 9 %
Neutro Abs: 6 10*3/uL (ref 1.7–7.7)
Neutrophils Relative %: 69 %
Platelets: 231 10*3/uL (ref 150–400)
RBC: 4.79 MIL/uL (ref 4.22–5.81)
RDW: 19.2 % — ABNORMAL HIGH (ref 11.5–15.5)
WBC: 8.6 10*3/uL (ref 4.0–10.5)
nRBC: 0 % (ref 0.0–0.2)

## 2021-02-26 LAB — TROPONIN I (HIGH SENSITIVITY)
Troponin I (High Sensitivity): 90 ng/L — ABNORMAL HIGH (ref <18)
Troponin I (High Sensitivity): 87 ng/L — ABNORMAL HIGH (ref <18)

## 2021-02-26 LAB — BRAIN NATRIURETIC PEPTIDE: B Natriuretic Peptide: 1845.5 pg/mL — ABNORMAL HIGH (ref 0.0–100.0)

## 2021-02-26 MED ORDER — POTASSIUM CHLORIDE CRYS ER 10 MEQ PO TBCR
10.0000 meq | EXTENDED_RELEASE_TABLET | Freq: Two times a day (BID) | ORAL | Status: DC
  Administered 2021-02-26 – 2021-02-28 (×4): 10 meq via ORAL
  Filled 2021-02-26 (×4): qty 1

## 2021-02-26 MED ORDER — NITROGLYCERIN 0.4 MG SL SUBL
0.4000 mg | SUBLINGUAL_TABLET | Freq: Once | SUBLINGUAL | Status: AC
  Administered 2021-02-26: 0.4 mg via SUBLINGUAL
  Filled 2021-02-26: qty 1

## 2021-02-26 MED ORDER — EZETIMIBE 10 MG PO TABS
10.0000 mg | ORAL_TABLET | Freq: Every day | ORAL | Status: DC
  Administered 2021-02-27 – 2021-03-02 (×4): 10 mg via ORAL
  Filled 2021-02-26 (×5): qty 1

## 2021-02-26 MED ORDER — AMLODIPINE BESYLATE 5 MG PO TABS: 5.0000 mg | ORAL_TABLET | Freq: Every day | ORAL | Status: DC

## 2021-02-26 MED ORDER — LEVOTHYROXINE SODIUM 100 MCG PO TABS: 100.0000 ug | ORAL_TABLET | Freq: Every day | ORAL | Status: DC

## 2021-02-26 MED ORDER — ACETAMINOPHEN 325 MG PO TABS: 650.0000 mg | ORAL_TABLET | Freq: Four times a day (QID) | ORAL | Status: DC | PRN

## 2021-02-26 MED ORDER — POLYVINYL ALCOHOL 1.4 % OP SOLN: 1.0000 [drp] | Freq: Every day | OPHTHALMIC | Status: DC | PRN

## 2021-02-26 MED ORDER — ROSUVASTATIN CALCIUM 20 MG PO TABS
20.0000 mg | ORAL_TABLET | Freq: Every day | ORAL | Status: DC
  Administered 2021-02-28 – 2021-03-02 (×3): 20 mg via ORAL
  Filled 2021-02-26 (×4): qty 1

## 2021-02-26 MED ORDER — ICOSAPENT ETHYL 1 G PO CAPS
2.0000 g | ORAL_CAPSULE | Freq: Two times a day (BID) | ORAL | Status: DC
  Administered 2021-02-26 – 2021-02-28 (×3): 2 g via ORAL
  Filled 2021-02-26 (×4): qty 2

## 2021-02-26 MED ORDER — FUROSEMIDE 10 MG/ML IJ SOLN
80.0000 mg | Freq: Three times a day (TID) | INTRAMUSCULAR | Status: DC
  Administered 2021-02-27 – 2021-03-02 (×10): 80 mg via INTRAVENOUS
  Filled 2021-02-26 (×10): qty 8

## 2021-02-26 MED ORDER — FUROSEMIDE 10 MG/ML IJ SOLN
80.0000 mg | Freq: Once | INTRAMUSCULAR | Status: AC
  Administered 2021-02-26: 80 mg via INTRAVENOUS
  Filled 2021-02-26: qty 8

## 2021-02-26 MED ORDER — CLONIDINE HCL 0.1 MG PO TABS
0.1000 mg | ORAL_TABLET | Freq: Two times a day (BID) | ORAL | Status: DC
  Administered 2021-02-26 – 2021-03-02 (×8): 0.1 mg via ORAL
  Filled 2021-02-26 (×8): qty 1

## 2021-02-26 MED ORDER — ACETAMINOPHEN 650 MG RE SUPP: 650.0000 mg | Freq: Four times a day (QID) | RECTAL | Status: DC | PRN

## 2021-02-26 MED ORDER — BENAZEPRIL HCL 40 MG PO TABS
40.0000 mg | ORAL_TABLET | Freq: Every day | ORAL | Status: DC
  Administered 2021-02-28: 40 mg via ORAL
  Filled 2021-02-26 (×2): qty 1

## 2021-02-26 MED ORDER — INSULIN ASPART 100 UNIT/ML ~~LOC~~ SOLN
0.0000 [IU] | Freq: Three times a day (TID) | SUBCUTANEOUS | Status: DC
  Administered 2021-02-28 – 2021-03-01 (×3): 1 [IU] via SUBCUTANEOUS
  Administered 2021-03-01: 3 [IU] via SUBCUTANEOUS
  Administered 2021-03-01: 2 [IU] via SUBCUTANEOUS

## 2021-02-26 MED ORDER — INSULIN GLARGINE 100 UNIT/ML ~~LOC~~ SOLN
15.0000 [IU] | Freq: Every evening | SUBCUTANEOUS | Status: DC
  Administered 2021-02-27: 15 [IU] via SUBCUTANEOUS
  Filled 2021-02-26 (×2): qty 0.15

## 2021-02-26 MED ORDER — APIXABAN 2.5 MG PO TABS
2.5000 mg | ORAL_TABLET | Freq: Two times a day (BID) | ORAL | Status: DC
  Administered 2021-02-26 – 2021-03-02 (×8): 2.5 mg via ORAL
  Filled 2021-02-26 (×9): qty 1

## 2021-02-26 NOTE — ED Notes (Addendum)
Pt's O2 sat decreased to 85% on 5 lpm via [place]. RT called to place pt on high flow [place]. Pt O2 increased to 6 lpm via , O2 increased to 91%.

## 2021-02-26 NOTE — ED Notes (Signed)
Pt O2 sat decreased into 70's when attempting to take off of Bipap and place on high flow [place] with RT. Pt remains at bedside.

## 2021-02-26 NOTE — ED Provider Notes (Signed)
[facility] EMERGENCY DEPARTMENT Provider Note   CSN: [id number] Arrival date & time: 02/14/2021  1721     History No chief complaint on file.   [name] is a 87 y.o. male.history of type 2 diabetes mellitus, hypertension, coronary artery.disease diastolic CHF with chronic respiratory failure on home O2 at 2L/min and chronic kidney disease, Covid infection in January requiring hospitalization.  Presents to ER with concern for shortness of breath.  Patient reports for the past few days he has had worsening shortness of breath, much more severe today.  States he gets extremely winded with any exertion.  Also has noted increased swelling in his legs, extends up to his thighs.  No chest pain.  No fevers, cough, congestion.  Additional history obtained from chart review.  HPI     Past Medical History:  Diagnosis Date  . CHF (congestive heart failure) ([place])   . Chronic kidney disease    tumor right kidney  . COPD (chronic obstructive pulmonary disease) ([place])   . Coronary artery disease   . Diabetes mellitus    type 2  . Hypertension   . Myocardial infarction ([place])    1997  . Right renal mass     Patient Active Problem List   Diagnosis Date Noted  . Acute CHF (congestive heart failure) ([place]) 02/14/2021  . Acute hypoxemic respiratory failure ([place]) 12/26/2020  . Atrial fibrillation ([place])   . Acute on chronic right heart failure ([place]) 08/24/2020  . Diabetes ([place]) 07/21/2020  . OSA (obstructive sleep apnea) 05/15/2019  . Acute on chronic diastolic heart failure ([place]) 05/06/2018  . Pleural effusion 05/06/2018  . COPD (chronic obstructive pulmonary disease) with emphysema ([place]) 04/29/2017  . Lung nodule 04/29/2017  . Hx of CABG 04/08/2017  . Chronic respiratory failure with hypoxia ([place]) 03/23/2017  . Pulmonary hypertension ([place]) 03/22/2017  . Shortness of breath 03/22/2017  . Pericardial effusion 03/22/2017  . Chronic diastolic heart failure ([place])  02/25/2017  . Acute on chronic renal failure ([place]) 11/18/2016  . Renal mass s/p right partial nephrectomy 02/19/2017 11/18/2016  . Dyslipidemia 05/12/2013  . Essential hypertension 01/14/2012    Past Surgical History:  Procedure Laterality Date  . CARDIAC CATHETERIZATION  1996  . CARDIOVERSION N/A 08/29/2020   Procedure: CARDIOVERSION;  Surgeon: Pixie Casino, MD;  Location: [facility] ENDOSCOPY;  Service: Cardiovascular;  Laterality: N/A;  . CORONARY ARTERY BYPASS GRAFT    . DOPPLER ECHOCARDIOGRAPHY  2011  . HERNIA REPAIR    . IR GENERIC HISTORICAL  12/09/2016   IR RADIOLOGIST EVAL & MGMT 12/09/2016 [name], MD [facility] INTERV RAD  . ROBOTIC ASSITED PARTIAL NEPHRECTOMY Right 02/19/2017   Procedure: XI ROBOTIC ASSITED PARTIAL NEPHRECTOMY;  Surgeon: [name], MD;  Location: [facility] ORS;  Service: Urology;  Laterality: Right;  . TEE WITHOUT CARDIOVERSION N/A 08/29/2020   Procedure: TRANSESOPHAGEAL ECHOCARDIOGRAM (TEE);  Surgeon: Pixie Casino, MD;  Location: [facility] ENDOSCOPY;  Service: Cardiovascular;  Laterality: N/A;       Family History  Problem Relation Age of Onset  . Diabetes Mother   . Diabetes Sister     Social History   Tobacco Use  . Smoking status: Former Smoker    Packs/day: 0.30    Years: 45.00    Pack years: 13.50    Types: Cigarettes    Quit date: 02/15/1997    Years since quitting: 24.0  . Smokeless tobacco: Never Used  Vaping Use  . Vaping Use: Never used  Substance  Use Topics  . Alcohol use: No  . Drug use: No    Home Medications Prior to Admission medications   Medication Sig Start Date End Date Taking? Authorizing Provider  acetaminophen (TYLENOL) 325 MG tablet Take 650 mg by mouth every 6 (six) hours as needed (for pain.).    [provider]  amLODipine (NORVASC) 5 MG tablet Take 5 mg by mouth daily. 11/10/20   [provider]  apixaban (ELIQUIS) 2.5 MG TABS tablet Take 1 tablet (2.5 mg total) by mouth 2 (two) times daily. 08/15/20    Hilty, Nadean Corwin, MD  benazepril (LOTENSIN) 40 MG tablet Take 1 tablet (40 mg total) by mouth daily. 01/07/21   Elgergawy, Silver Huguenin, MD  cloNIDine (CATAPRES) 0.1 MG tablet Take 0.1 mg by mouth 2 (two) times daily.    [provider]  dexamethasone (DECADRON) 6 MG tablet Take 1 tablet (6 mg total) by mouth daily. 01/03/21   Elgergawy, Silver Huguenin, MD  ezetimibe (ZETIA) 10 MG tablet Take 10 mg by mouth daily.    [provider]  furosemide (LASIX) 80 MG tablet Take 1 tablet (80 mg total) by mouth 2 (two) times daily. 09/20/20   Hilty, Nadean Corwin, MD  HUMALOG KWIKPEN 100 UNIT/ML KiwkPen Inject 6-8 Units into the skin 3 (three) times daily.  07/25/15   [provider]  icosapent Ethyl (VASCEPA) 1 g capsule TAKE 2 CAPSULES(2 GRAMS) BY MOUTH TWICE DAILY Patient taking differently: Take 2 g by mouth 2 (two) times daily. 09/26/20   Hilty, Nadean Corwin, MD  LANTUS SOLOSTAR 100 UNIT/ML Solostar Pen Inject 15 Units into the skin every evening. 02/23/19   [provider]  levothyroxine (SYNTHROID) 100 MCG tablet Take 100 mcg by mouth daily before breakfast.    [provider]  potassium chloride (KLOR-CON) 10 MEQ tablet Take 1 tablet (10 mEq total) by mouth 2 (two) times daily. 01/06/21   Elgergawy, Silver Huguenin, MD  Propylene Glycol (SYSTANE BALANCE) 0.6 % SOLN Place 1 drop into both eyes daily as needed (dry eyes).    [provider]  rosuvastatin (CRESTOR) 20 MG tablet TAKE 1 TABLET BY MOUTH EVERY DAY 02/13/21   Hilty, Nadean Corwin, MD    Allergies    Patient has no known allergies.  Review of Systems   Review of Systems  Constitutional: Negative for chills and fever.  HENT: Negative for ear pain and sore throat.   Eyes: Negative for pain and visual disturbance.  Respiratory: Positive for shortness of breath. Negative for cough.   Cardiovascular: Positive for leg swelling. Negative for chest pain and palpitations.  Gastrointestinal: Negative for abdominal pain and  vomiting.  Genitourinary: Negative for dysuria and hematuria.  Musculoskeletal: Negative for arthralgias and back pain.  Skin: Negative for color change and rash.  Neurological: Negative for seizures and syncope.  All other systems reviewed and are negative.   Physical Exam Updated Vital Signs BP (!) 162/88   Pulse 77   Temp 97.9 F (36.6 C) (Oral)   Resp (!) 27   SpO2 98%   Physical Exam Vitals and nursing note reviewed.  Constitutional:      Appearance: He is well-developed.  HENT:     Head: Normocephalic and atraumatic.  Eyes:     Conjunctiva/sclera: Conjunctivae normal.  Cardiovascular:     Rate and Rhythm: Normal rate and regular rhythm.     Heart sounds: No murmur heard.   Pulmonary:     Comments: Mildly tachypneic but speaking  in full sentences, diminished breath sounds at bases Abdominal:     Palpations: Abdomen is soft.     Tenderness: There is no abdominal tenderness.  Musculoskeletal:     Cervical back: Neck supple.     Comments: Pitting edema up to the level of mid thigh equal bilaterally  Skin:    General: Skin is warm and dry.  Neurological:     Mental Status: He is alert.  Psychiatric:        Mood and Affect: Mood normal.     ED Results / Procedures / Treatments   Labs (all labs ordered are listed, but only abnormal results are displayed) Labs Reviewed  CBC WITH DIFFERENTIAL/PLATELET - Abnormal; Notable for the following components:      Result Value   RDW 19.2 (*)    All other components within normal limits  BASIC METABOLIC PANEL - Abnormal; Notable for the following components:   Glucose, Bld 107 (*)    BUN 24 (*)    Creatinine, Ser 1.61 (*)    Calcium 8.8 (*)    GFR, Estimated 41 (*)    All other components within normal limits  BRAIN NATRIURETIC PEPTIDE - Abnormal; Notable for the following components:   B Natriuretic Peptide 1,845.5 (*)    All other components within normal limits  TROPONIN I (HIGH SENSITIVITY) - Abnormal; Notable  for the following components:   Troponin I (High Sensitivity) 90 (*)    All other components within normal limits  BLOOD GAS, VENOUS  TROPONIN I (HIGH SENSITIVITY)    EKG EKG Interpretation  Date/Time:  Wednesday February 26 2021 17:38:19 EDT Ventricular Rate:  95 PR Interval:    QRS Duration: 80 QT Interval:  366 QTC Calculation: 459 R Axis:   42 Text Interpretation: Atrial fibrillation Possible Anterior infarct , age undetermined Abnormal ECG Confirmed by Madalyn Rob [phone]) on 02/13/2021 6:21:18 PM   Radiology DG Chest Portable 1 View  Result Date:  CLINICAL DATA:  Shortness of breath EXAM: PORTABLE CHEST 1 VIEW COMPARISON:  12/26/2020 FINDINGS: Prior CABG. Moderate to large left effusion. Small right pleural effusion. Diffuse airspace disease throughout the lungs. Mild cardiomegaly. Underlying COPD. No acute bony abnormality. IMPRESSION: Borderline heart size. Bilateral pleural effusions, left greater than right. Bilateral airspace disease, likely edema. Underlying COPD. Electronically Signed   By: Rolm Baptise M.D.   On: 03/03/2021 18:34    Procedures .Critical Care Performed by: Lucrezia Starch, MD Authorized by: Lucrezia Starch, MD   Critical care provider statement:    Critical care time (minutes):  72   Critical care was necessary to treat or prevent imminent or life-threatening deterioration of the following conditions:  Cardiac failure and respiratory failure   Critical care was time spent personally by me on the following activities:  Discussions with consultants, evaluation of patient's response to treatment, examination of patient, ordering and performing treatments and interventions, ordering and review of laboratory studies, ordering and review of radiographic studies, pulse oximetry, re-evaluation of patient's condition, obtaining history from patient or surrogate and review of old charts     Medications Ordered in ED Medications   nitroGLYCERIN (NITROSTAT) SL tablet 0.4 mg (has no administration in time range)  furosemide (LASIX) injection 80 mg (80 mg Intravenous Given 02/16/2021 1954)    ED Course  I have reviewed the triage vital signs and the nursing notes.  Pertinent labs & imaging results that were available during my care of the patient were reviewed by me and  considered in my medical decision making (see chart for details).    MDM Rules/Calculators/A&P                         87 year old gentleman presenting to ER with concern for worsening shortness of breath.  On exam noted to have pitting edema to his mid thigh, hypoxia initially requiring 4 L nasal cannula.  CXR concerning for pleural effusions, interstitial airspace disease.  Elevated BNP.  Suspect most likely heart failure, fluid overload state.  Mild troponin elevation, suspect demand related.  Provided IV diuresis.  Patient work of breathing worsened and hypoxia worsened.  Initiated BiPAP and both hypoxia and work of breathing improved.  Will admit to the hospitalist service for further management.  Dr. Hal Hope accepting.  Final Clinical Impression(s) / ED Diagnoses Final diagnoses:  Acute respiratory failure with hypoxia (HCC)  Acute on chronic heart failure, unspecified heart failure type (HCC)  Hypervolemia, unspecified hypervolemia type    Rx / DC Orders ED Discharge Orders    None       Lucrezia Starch, MD 02/24/2021 2128

## 2021-02-26 NOTE — Progress Notes (Signed)
Patient trialed off BIPAP after stating his breathing felt better. Patient placed on 15L HFNC. Within a few minutes, patient's oxygen dropped down to the high 70s - low 80s and stayed there. Patient had noticeable increased WOB along with RR. Patient placed back on BIPAP. Once back on BIPAP oxygen came back up to high 90s, RR and WOB improved. RT will continue to monitor.

## 2021-02-26 NOTE — ED Triage Notes (Signed)
Patient complains of increasing sob with increased fluid retention. Patient arrived on 4l of oxygen and states with any movement he cant breath. Patient denies pain, alert and oriented

## 2021-02-26 NOTE — H&P (Addendum)
History and Physical    PHILIPPE GANG WEX:[id number] DOB: 11-02-1932 DOA: 02/19/2021  PCP: [name], MD  Patient coming from: Home.  Chief Complaint: Shortness of breath.  HPI: [name] is a 87 y.o. male with history of chronic CHF with last EF measured was 68 to 65% in September 2021 with severe pulmonary hypertension and moderate RV dysfunction with history of A. fib, hypertension, chronic kidney disease, chronic anemia COPD presents to the ER with complaint of worsening shortness of breath.  Patient was admitted in January 2022 with Covid infection at that time also was treated for CHF states that he has been getting progressively short of breath since the last admission.  Increasing peripheral edema abdominal girth but no chest pain or productive cough fever or chills.  Patient states he has been compliant with Lasix which he takes 80 mg twice daily despite which patient has been gaining a lot of weight.  ED Course: In the ER patient was hypoxic and chest x-ray shows bilateral pleural effusion had to be placed on BiPAP.  Was given 80 mg IV Lasix and on exam patient has significant lower extremity maxillae up to the thighs and also elevated JVD.  EKG shows A. fib rate controlled.  High sensitive troponins were 90 and 81 and BNP of 1800.  Patient admitted for acute respiratory failure with hypoxia on BiPAP secondary to CHF.  Review of Systems: As per HPI, rest all negative.   Past Medical History:  Diagnosis Date  . CHF (congestive heart failure) ([place])   . Chronic kidney disease    tumor right kidney  . COPD (chronic obstructive pulmonary disease) ([place])   . Coronary artery disease   . Diabetes mellitus    type 2  . Hypertension   . Myocardial infarction ([place])    1997  . Right renal mass     Past Surgical History:  Procedure Laterality Date  . CARDIAC CATHETERIZATION  1996  . CARDIOVERSION N/A 08/29/2020   Procedure: CARDIOVERSION;  Surgeon: Pixie Casino, MD;  Location:  [facility] ENDOSCOPY;  Service: Cardiovascular;  Laterality: N/A;  . CORONARY ARTERY BYPASS GRAFT    . DOPPLER ECHOCARDIOGRAPHY  2011  . HERNIA REPAIR    . IR GENERIC HISTORICAL  12/09/2016   IR RADIOLOGIST EVAL & MGMT 12/09/2016 [name], MD [facility] INTERV RAD  . ROBOTIC ASSITED PARTIAL NEPHRECTOMY Right 02/19/2017   Procedure: XI ROBOTIC ASSITED PARTIAL NEPHRECTOMY;  Surgeon: [name], MD;  Location: [facility] ORS;  Service: Urology;  Laterality: Right;  . TEE WITHOUT CARDIOVERSION N/A 08/29/2020   Procedure: TRANSESOPHAGEAL ECHOCARDIOGRAM (TEE);  Surgeon: Pixie Casino, MD;  Location: [facility] ENDOSCOPY;  Service: Cardiovascular;  Laterality: N/A;     reports that he quit smoking about 24 years ago. His smoking use included cigarettes. He has a 13.50 pack-year smoking history. He has never used smokeless tobacco. He reports that he does not drink alcohol and does not use drugs.  No Known Allergies  Family History  Problem Relation Age of Onset  . Diabetes Mother   . Diabetes Sister     Prior to Admission medications   Medication Sig Start Date End Date Taking? Authorizing Provider  acetaminophen (TYLENOL) 325 MG tablet Take 650 mg by mouth every 6 (six) hours as needed (for pain.).    [provider]  amLODipine (NORVASC) 5 MG tablet Take 5 mg by mouth daily. 11/10/20   [provider]  apixaban (ELIQUIS) 2.5 MG TABS tablet  Take 1 tablet (2.5 mg total) by mouth 2 (two) times daily. 08/15/20   Hilty, Nadean Corwin, MD  benazepril (LOTENSIN) 40 MG tablet Take 1 tablet (40 mg total) by mouth daily. 01/07/21   Elgergawy, Silver Huguenin, MD  cloNIDine (CATAPRES) 0.1 MG tablet Take 0.1 mg by mouth 2 (two) times daily.    [provider]  dexamethasone (DECADRON) 6 MG tablet Take 1 tablet (6 mg total) by mouth daily. 01/03/21   Elgergawy, Silver Huguenin, MD  ezetimibe (ZETIA) 10 MG tablet Take 10 mg by mouth daily.    [provider]  furosemide (LASIX) 80 MG tablet Take 1 tablet (80 mg  total) by mouth 2 (two) times daily. 09/20/20   Hilty, Nadean Corwin, MD  HUMALOG KWIKPEN 100 UNIT/ML KiwkPen Inject 6-8 Units into the skin 3 (three) times daily.  07/25/15   [provider]  icosapent Ethyl (VASCEPA) 1 g capsule TAKE 2 CAPSULES(2 GRAMS) BY MOUTH TWICE DAILY Patient taking differently: Take 2 g by mouth 2 (two) times daily. 09/26/20   Hilty, Nadean Corwin, MD  LANTUS SOLOSTAR 100 UNIT/ML Solostar Pen Inject 15 Units into the skin every evening. 02/23/19   [provider]  levothyroxine (SYNTHROID) 100 MCG tablet Take 100 mcg by mouth daily before breakfast.    [provider]  potassium chloride (KLOR-CON) 10 MEQ tablet Take 1 tablet (10 mEq total) by mouth 2 (two) times daily. 01/06/21   Elgergawy, Silver Huguenin, MD  Propylene Glycol (SYSTANE BALANCE) 0.6 % SOLN Place 1 drop into both eyes daily as needed (dry eyes).    [provider]  rosuvastatin (CRESTOR) 20 MG tablet TAKE 1 TABLET BY MOUTH EVERY DAY 02/13/21   Hilty, Nadean Corwin, MD    Physical Exam: Constitutional: Moderately built and nourished. Vitals:   02/28/2021 1800 02/11/2021 1815 02/10/2021 1936 02/25/2021 2025  BP: (!) 158/81  (!) 153/94 (!) 162/88  Pulse: 91  80 77  Resp: (!) 25  (!) 21 (!) 27  Temp:   97.9 F (36.6 C)   TempSrc:   Oral   SpO2: 99% 99% 96% 98%   Eyes: Anicteric no pallor. ENMT: No discharge from the ears eyes nose or mouth. Neck: No mass felt.  No neck rigidity. Respiratory: No rhonchi mild crepitations. Cardiovascular: S1-S2 heard. Abdomen: Soft mild distended nontender bowel sounds present. Musculoskeletal: Bilateral lower extremity edema extending up to thighs. Skin: No rash.  Chronic skin changes. Neurologic: Alert awake oriented to time place and person.  Moves all extremities. Psychiatric: Appears normal.  Normal affect.   Labs on Admission: I have personally reviewed following labs and imaging studies  CBC: Recent Labs  Lab 02/18/2021 1812  WBC 8.6  NEUTROABS  6.0  HGB 13.3  HCT 43.2  MCV 90.2  PLT 073   Basic Metabolic Panel: Recent Labs  Lab 02/09/2021 1812  NA 141  K 4.0  CL 104  CO2 29  GLUCOSE 107*  BUN 24*  CREATININE 1.61*  CALCIUM 8.8*   GFR: CrCl cannot be calculated (Unknown ideal weight.). Liver Function Tests: No results for input(s): AST, ALT, ALKPHOS, BILITOT, PROT, ALBUMIN in the last 168 hours. No results for input(s): LIPASE, AMYLASE in the last 168 hours. No results for input(s): AMMONIA in the last 168 hours. Coagulation Profile: No results for input(s): INR, PROTIME in the last 168 hours. Cardiac Enzymes: No results for input(s): CKTOTAL, CKMB, CKMBINDEX, TROPONINI in the last 168 hours. BNP (last 3 results) No results for input(s): PROBNP  in the last 8760 hours. HbA1C: No results for input(s): HGBA1C in the last 72 hours. CBG: No results for input(s): GLUCAP in the last 168 hours. Lipid Profile: No results for input(s): CHOL, HDL, LDLCALC, TRIG, CHOLHDL, LDLDIRECT in the last 72 hours. Thyroid Function Tests: No results for input(s): TSH, T4TOTAL, FREET4, T3FREE, THYROIDAB in the last 72 hours. Anemia Panel: No results for input(s): VITAMINB12, FOLATE, FERRITIN, TIBC, IRON, RETICCTPCT in the last 72 hours. Urine analysis:    Component Value Date/Time   COLORURINE YELLOW 08/23/2020 2103   APPEARANCEUR CLEAR 08/23/2020 2103   LABSPEC 1.011 08/23/2020 2103   PHURINE 5.0 08/23/2020 2103   GLUCOSEU NEGATIVE 08/23/2020 2103   HGBUR NEGATIVE 08/23/2020 2103   [place] NEGATIVE 08/23/2020 2103   [place] NEGATIVE 08/23/2020 2103   PROTEINUR 30 (A) 08/23/2020 2103   UROBILINOGEN 0.2 05/12/2013 1543   NITRITE NEGATIVE 08/23/2020 2103   LEUKOCYTESUR NEGATIVE 08/23/2020 2103   Sepsis Labs: @LABRCNTIP (procalcitonin:4,lacticidven:4) )No results found for this or any previous visit (from the past 240 hour(s)).   Radiological Exams on Admission: DG Chest Portable 1 View  Result Date:  02/13/2021 CLINICAL DATA:  Shortness of breath EXAM: PORTABLE CHEST 1 VIEW COMPARISON:  12/26/2020 FINDINGS: Prior CABG. Moderate to large left effusion. Small right pleural effusion. Diffuse airspace disease throughout the lungs. Mild cardiomegaly. Underlying COPD. No acute bony abnormality. IMPRESSION: Borderline heart size. Bilateral pleural effusions, left greater than right. Bilateral airspace disease, likely edema. Underlying COPD. Electronically Signed   By: Rolm Baptise M.D.   On: 02/25/2021 18:34    EKG: Independently reviewed.  A. fib rate controlled.  Assessment/Plan Principal Problem:   Acute on chronic heart failure [facility]) Active Problems:   Essential hypertension   Renal mass s/p right partial nephrectomy 02/19/2017   Pulmonary hypertension (HCC)   Hx of CABG   COPD (chronic obstructive pulmonary disease) with emphysema (HCC)   OSA (obstructive sleep apnea)   Atrial fibrillation (HCC)   Type 2 diabetes mellitus with vascular disease ([place])    1. Acute on chronic respiratory failure secondary to acute CHF last EF measured was 60 to 65% with severe pulmonary hypertension RV dysfunction presently on BiPAP with 60% FiO2 to maintain sats more than 90%.  Patient received 80 mg IV Lasix will keep patient on Lasix 80 mg IV every 8 closely follow intake output metabolic panel daily weights.  We will try to slowly wean off the BiPAP if patient has good output and if patient can tolerate. 2. Hypertension on clonidine benazepril and amlodipine.  Follow blood pressure trends. 3. Chronic kidney disease stage IV creatinine appears to be at baseline.  Note that patient is on IV Lasix and benazepril. 4. Anemia likely from renal disease follow CBC. 5. History of renal mass status post partial nephrectomy in 2018. 6. A. fib presently rate controlled patient is also on apixaban.  Note that patient has renal disease. 7. Diabetes mellitus type 2 on Lantus insulin.  Closely follow  CBGs. 8. Hypothyroidism on Synthroid.  Check TSH. 9. Sleep apnea on CPAP presently on BiPAP. 10. COPD not actively wheezing. 11. History of CAD status post CABG denies any chest pain.  On apixaban statins.  This patient has acute respiratory failure requiring BiPAP at this time with significant peripheral edema and decompensated CHF will need close monitoring and inpatient status.  Since patient has had recent Covid infection within the past 3 months the Covid test was not repeated.   DVT prophylaxis: Apixaban. Code Status: Full  code. Family Communication: Discussed with patient. Disposition Plan: Home when stable. Consults called: Cardiology. Admission status: Inpatient.   Rise Patience MD [facility] Pager [phone].  If 7PM-7AM, please contact night-coverage [email or web address] Password [facility]  02/25/2021, 10:08 PM

## 2021-02-27 ENCOUNTER — Encounter (HOSPITAL_COMMUNITY): Payer: Self-pay | Admitting: Internal Medicine

## 2021-02-27 DIAGNOSIS — Z951 Presence of aortocoronary bypass graft: Secondary | ICD-10-CM

## 2021-02-27 DIAGNOSIS — I5033 Acute on chronic diastolic (congestive) heart failure: Secondary | ICD-10-CM | POA: Diagnosis not present

## 2021-02-27 DIAGNOSIS — I248 Other forms of acute ischemic heart disease: Secondary | ICD-10-CM

## 2021-02-27 DIAGNOSIS — I272 Pulmonary hypertension, unspecified: Secondary | ICD-10-CM | POA: Diagnosis not present

## 2021-02-27 LAB — COMPREHENSIVE METABOLIC PANEL
ALT: 26 U/L (ref 0–44)
AST: 40 U/L (ref 15–41)
Albumin: 2.6 g/dL — ABNORMAL LOW (ref 3.5–5.0)
Alkaline Phosphatase: 83 U/L (ref 38–126)
Anion gap: 7 (ref 5–15)
BUN: 24 mg/dL — ABNORMAL HIGH (ref 8–23)
CO2: 27 mmol/L (ref 22–32)
Calcium: 8.4 mg/dL — ABNORMAL LOW (ref 8.9–10.3)
Chloride: 108 mmol/L (ref 98–111)
Creatinine, Ser: 1.55 mg/dL — ABNORMAL HIGH (ref 0.61–1.24)
GFR, Estimated: 43 mL/min — ABNORMAL LOW (ref >60)
Glucose, Bld: 114 mg/dL — ABNORMAL HIGH (ref 70–99)
Potassium: 3.9 mmol/L (ref 3.5–5.1)
Sodium: 142 mmol/L (ref 135–145)
Total Bilirubin: 1.4 mg/dL — ABNORMAL HIGH (ref 0.3–1.2)
Total Protein: 5.9 g/dL — ABNORMAL LOW (ref 6.5–8.1)

## 2021-02-27 LAB — I-STAT VENOUS BLOOD GAS, ED
Acid-Base Excess: 6 mmol/L — ABNORMAL HIGH (ref 0.0–2.0)
Bicarbonate: 31.1 mmol/L — ABNORMAL HIGH (ref 20.0–28.0)
Calcium, Ion: 1.1 mmol/L — ABNORMAL LOW (ref 1.15–1.40)
HCT: 37 % — ABNORMAL LOW (ref 39.0–52.0)
Hemoglobin: 12.6 g/dL — ABNORMAL LOW (ref 13.0–17.0)
O2 Saturation: 100 %
Potassium: 3.9 mmol/L (ref 3.5–5.1)
Sodium: 143 mmol/L (ref 135–145)
TCO2: 32 mmol/L (ref 22–32)
pCO2, Ven: 46.2 mmHg (ref 44.0–60.0)
pH, Ven: 7.436 — ABNORMAL HIGH (ref 7.250–7.430)
pO2, Ven: 214 mmHg — ABNORMAL HIGH (ref 32.0–45.0)

## 2021-02-27 LAB — CBG MONITORING, ED
Glucose-Capillary: 108 mg/dL — ABNORMAL HIGH (ref 70–99)
Glucose-Capillary: 77 mg/dL (ref 70–99)
Glucose-Capillary: 82 mg/dL (ref 70–99)

## 2021-02-27 LAB — CBC
HCT: 37.4 % — ABNORMAL LOW (ref 39.0–52.0)
Hemoglobin: 11.8 g/dL — ABNORMAL LOW (ref 13.0–17.0)
MCH: 28 pg (ref 26.0–34.0)
MCHC: 31.6 g/dL (ref 30.0–36.0)
MCV: 88.8 fL (ref 80.0–100.0)
Platelets: 180 10*3/uL (ref 150–400)
RBC: 4.21 MIL/uL — ABNORMAL LOW (ref 4.22–5.81)
RDW: 19.1 % — ABNORMAL HIGH (ref 11.5–15.5)
WBC: 5.9 10*3/uL (ref 4.0–10.5)
nRBC: 0 % (ref 0.0–0.2)

## 2021-02-27 LAB — GLUCOSE, CAPILLARY
Glucose-Capillary: 57 mg/dL — ABNORMAL LOW (ref 70–99)
Glucose-Capillary: 105 mg/dL — ABNORMAL HIGH (ref 70–99)
Glucose-Capillary: 88 mg/dL (ref 70–99)

## 2021-02-27 LAB — SARS CORONAVIRUS 2 (TAT 6-24 HRS): SARS Coronavirus 2: NEGATIVE

## 2021-02-27 LAB — TSH: TSH: 12.388 u[IU]/mL — ABNORMAL HIGH (ref 0.350–4.500)

## 2021-02-27 LAB — MAGNESIUM: Magnesium: 2.1 mg/dL (ref 1.7–2.4)

## 2021-02-27 MED ORDER — AMLODIPINE BESYLATE 10 MG PO TABS
10.0000 mg | ORAL_TABLET | Freq: Every day | ORAL | Status: DC
  Administered 2021-02-28 – 2021-03-02 (×3): 10 mg via ORAL
  Filled 2021-02-27 (×4): qty 1

## 2021-02-27 MED ORDER — DEXTROSE 50 % IV SOLN: 12.5000 g | INTRAVENOUS | Status: AC

## 2021-02-27 MED ORDER — LEVOTHYROXINE SODIUM 112 MCG PO TABS
112.0000 ug | ORAL_TABLET | Freq: Every day | ORAL | Status: DC
  Administered 2021-02-28 – 2021-03-02 (×3): 112 ug via ORAL
  Filled 2021-02-27 (×3): qty 1

## 2021-02-27 MED ORDER — HYDRALAZINE HCL 20 MG/ML IJ SOLN
10.0000 mg | INTRAMUSCULAR | Status: DC | PRN
  Administered 2021-02-27 – 2021-02-28 (×2): 10 mg via INTRAVENOUS
  Filled 2021-02-27 (×2): qty 1

## 2021-02-27 MED ORDER — NITROGLYCERIN 2 % TD OINT
0.5000 [in_us] | TOPICAL_OINTMENT | Freq: Four times a day (QID) | TRANSDERMAL | Status: AC
  Filled 2021-02-27: qty 30

## 2021-02-27 MED ORDER — DEXTROSE 50 % IV SOLN
INTRAVENOUS | Status: AC
  Administered 2021-02-27: 12.5 g via INTRAVENOUS
  Filled 2021-02-27: qty 50

## 2021-02-27 NOTE — Plan of Care (Signed)

## 2021-02-27 NOTE — Consult Note (Signed)
Cardiology Consultation:   Patient ID: [name]; [id number]; 1932-11-20   Admit date: 03/02/2021 Date of Consult: 02/27/2021  Primary Care Provider: [name], MD Primary Cardiologist: Dr. Debara Pickett, MD   Patient Profile:   [name] is a 87 y.o. male with a hx of chronic diastolic CHF, mixed pulmonary venous and arterial hypertension followed by Dr. Lake Bells, CAD s/p CABG 1996, CKD stage II with a hx of partial nephrectomy 2018, HTN, HLD, RBBB, OSA with CPAP and chronic LE wounds followed by [facility] wound care RN who is being seen today for the evaluation of CHF at the request of Dr. Hal Hope.  History of Present Illness:   [name] is an 87yo M with a hx as stated above who presented to [facility] 02/18/2021 with worsening SOB over the last month. HPI somewhat limited given Bipap. He states that he was in his usual baseline state of health until approximately one month ago when he began having increased DOE and mild LE edema. He reports compliance with diet and medications which include Lasix 80mg  BID. He states that several days ago his symptoms acutely worsen to the point he was unable to ambulate across his house without severe difficultly. He reports that his LE edema was to his thighs although this has decreased since admission and IV Lasix. Given his persistent symptoms, he presented to the ED for further evaluation.     In the ED, he was found to be hypoxic with CXR showing bilateral pleural effusions. He was placed on Bipap and was given IV Lasix 80mg . Edema was noted up to thighs with elevated JVD. EKG showed AF with rate control. HsT were 90>>81. BNP elevated at 1800. He is currently resting comfortably on Bipap with on specific complaints. He explicitly denies chest pain or other anginal symptoms.    He is followed by Dr. Debara Pickett for his cardiology care. He underwent CABG in 1996. Follow up myoview stress test 01/05/2017 with no ischemia or infarct>low risk however EF at 44%. Subsequent echo  from 02/2017 showed normal LVEF at 60-65% with G2DD, mild-mod TR, mod pulmonic regurgitation, mod PA pressure at 7mmHg. Limited repeat study three months later showed moderate sized pericardial effusion localized over the RV with severe PH. Repeat study 04/05/2017 with resolution of pericardial effusion with improved RVSP.   He was hospitalized 08/2020 and 09/2020 with CHF treated for diastolic HF and AKI. At follow up he was found to be bradycardic with HR's in the 40's with EKG showing new onset AF with slow VR. TEE/DCCV showed and EF at 60 to 65% with moderate RV systolic dysfunction with no evidence of left atrial appendage thrombus at which time he was successfully cardioverted and maintained sinus rhythm. He has been followed by [facility] as well for non-healing LE wounds. At one point his Lasix was increased to 80mg  BID with instructions to take an additional 20-40mg  PRN. He has been on home supplemental oxygen 3-4L and is followed by pulmonary. His most recent hospitalization was 12/2020 for hypoxic respiratory failure secondary to COVID and CHF. He was stabilized and treated with steroid taper. He was resumed on home dosing of Lasix but required IV diuretics during his course. We have not seen him since his last OV with Dr. Debara Pickett.   Past Medical History:  Diagnosis Date  . CHF (congestive heart failure) ([place])   . Chronic kidney disease    tumor right kidney  . COPD (chronic obstructive pulmonary disease) ([place])   .  Coronary artery disease   . Diabetes mellitus    type 2  . Hypertension   . Myocardial infarction ([place])    1997  . Right renal mass     Past Surgical History:  Procedure Laterality Date  . CARDIAC CATHETERIZATION  1996  . CARDIOVERSION N/A 08/29/2020   Procedure: CARDIOVERSION;  Surgeon: Pixie Casino, MD;  Location: [facility] ENDOSCOPY;  Service: Cardiovascular;  Laterality: N/A;  . CORONARY ARTERY BYPASS GRAFT    . DOPPLER ECHOCARDIOGRAPHY  2011  . HERNIA REPAIR    . IR GENERIC  HISTORICAL  12/09/2016   IR RADIOLOGIST EVAL & MGMT 12/09/2016 [name], MD [facility] INTERV RAD  . ROBOTIC ASSITED PARTIAL NEPHRECTOMY Right 02/19/2017   Procedure: XI ROBOTIC ASSITED PARTIAL NEPHRECTOMY;  Surgeon: [name], MD;  Location: [facility] ORS;  Service: Urology;  Laterality: Right;  . TEE WITHOUT CARDIOVERSION N/A 08/29/2020   Procedure: TRANSESOPHAGEAL ECHOCARDIOGRAM (TEE);  Surgeon: Pixie Casino, MD;  Location: [facility] ENDOSCOPY;  Service: Cardiovascular;  Laterality: N/A;     Prior to Admission medications   Medication Sig Start Date End Date Taking? Authorizing Provider  acetaminophen (TYLENOL) 325 MG tablet Take 650 mg by mouth every 6 (six) hours as needed (for pain.).   Yes [provider]  amLODipine (NORVASC) 5 MG tablet Take 5 mg by mouth daily. 11/10/20  Yes [provider]  apixaban (ELIQUIS) 2.5 MG TABS tablet Take 1 tablet (2.5 mg total) by mouth 2 (two) times daily. 08/15/20  Yes Hilty, Nadean Corwin, MD  benazepril (LOTENSIN) 40 MG tablet Take 1 tablet (40 mg total) by mouth daily. 01/07/21  Yes Elgergawy, Silver Huguenin, MD  cloNIDine (CATAPRES) 0.1 MG tablet Take 0.1 mg by mouth 2 (two) times daily.   Yes [provider]  dexamethasone (DECADRON) 6 MG tablet Take 1 tablet (6 mg total) by mouth daily. 01/03/21  Yes Elgergawy, Silver Huguenin, MD  ezetimibe (ZETIA) 10 MG tablet Take 10 mg by mouth daily.   Yes [provider]  furosemide (LASIX) 80 MG tablet Take 1 tablet (80 mg total) by mouth 2 (two) times daily. 09/20/20  Yes Hilty, Nadean Corwin, MD  HUMALOG KWIKPEN 100 UNIT/ML KiwkPen Inject 6-8 Units into the skin 3 (three) times daily.  07/25/15  Yes [provider]  icosapent Ethyl (VASCEPA) 1 g capsule TAKE 2 CAPSULES(2 GRAMS) BY MOUTH TWICE DAILY Patient taking differently: Take 2 g by mouth 2 (two) times daily. 09/26/20  Yes Hilty, Nadean Corwin, MD  LANTUS SOLOSTAR 100 UNIT/ML Solostar Pen Inject 15 Units into the skin every evening. 02/23/19  Yes  [provider]  levothyroxine (SYNTHROID) 100 MCG tablet Take 100 mcg by mouth daily before breakfast.   Yes [provider]  potassium chloride (KLOR-CON) 10 MEQ tablet Take 1 tablet (10 mEq total) by mouth 2 (two) times daily. 01/06/21  Yes Elgergawy, Silver Huguenin, MD  Propylene Glycol (SYSTANE BALANCE) 0.6 % SOLN Place 1 drop into both eyes daily as needed (dry eyes).   Yes [provider]  rosuvastatin (CRESTOR) 20 MG tablet TAKE 1 TABLET BY MOUTH EVERY DAY 02/13/21  Yes Hilty, Nadean Corwin, MD    Inpatient Medications: Scheduled Meds: . amLODipine  5 mg Oral Daily  . apixaban  2.5 mg Oral BID  . benazepril  40 mg Oral Daily  . cloNIDine  0.1 mg Oral BID  . ezetimibe  10 mg Oral Daily  . furosemide  80 mg Intravenous Q8H  . icosapent Ethyl  2 g Oral BID  . insulin aspart  0-9 Units Subcutaneous TID WC  . insulin glargine  15 Units Subcutaneous QPM  . [START ON 02/28/2021] levothyroxine  112 mcg Oral QAC breakfast  . nitroGLYCERIN  0.5 inch Topical Q6H  . potassium chloride  10 mEq Oral BID  . rosuvastatin  20 mg Oral Daily   Continuous Infusions:  PRN Meds: acetaminophen **OR** acetaminophen, hydrALAZINE, polyvinyl alcohol  Allergies:   No Known Allergies  Social History:   Social History   Socioeconomic History  . Marital status: Married    Spouse name: Not on file  . Number of children: Not on file  . Years of education: Not on file  . Highest education level: Not on file  Occupational History  . Not on file  Tobacco Use  . Smoking status: Former Smoker    Packs/day: 0.30    Years: 45.00    Pack years: 13.50    Types: Cigarettes    Quit date: 02/15/1997    Years since quitting: 24.0  . Smokeless tobacco: Never Used  Vaping Use  . Vaping Use: Never used  Substance and Sexual Activity  . Alcohol use: No  . Drug use: No  . Sexual activity: Yes  Other Topics Concern  . Not on file  Social History Narrative  . Not on file   Social  Determinants of Health   Financial Resource Strain: Not on file  Food Insecurity: No Food Insecurity  . Worried About Charity fundraiser in the Last Year: Never true  . Ran Out of Food in the Last Year: Never true  Transportation Needs: No Transportation Needs  . Lack of Transportation (Medical): No  . Lack of Transportation (Non-Medical): No  Physical Activity: Not on file  Stress: Not on file  Social Connections: Not on file  Intimate Partner Violence: Not on file    Family History:   Family History  Problem Relation Age of Onset  . Diabetes Mother   . Diabetes Sister    Family Status:  Family Status  Relation Name Status  . Mother  Deceased  . Sister  (Not Specified)  . Father  Deceased  . MGM  Deceased  . MGF  Deceased  . PGM  Deceased  . PGF  Deceased    ROS:  Please see the history of present illness.  All other ROS reviewed and negative.     Physical Exam/Data:   Vitals:   02/27/21 1100 02/27/21 1115 02/27/21 1130 02/27/21 1236  BP:    (!) 162/85  Pulse:      Resp: (!) 26 (!) 5 13   Temp:      TempSrc:      SpO2: 94% (!) 88% 99%     Intake/Output Summary (Last 24 hours) at 02/27/2021 1305 Last data filed at 02/27/2021 0947 Gross per 24 hour  Intake --  Output 1775 ml  Net -1775 ml   There were no vitals filed for this visit. There is no height or weight on file to calculate BMI.    General: Elderly, NAD Neck: Negative for carotid bruits. No JVD Lungs:Clear to ausculation bilaterally. No wheezes, rales, or rhonchi. Breathing is unlabored on Bipap Cardiovascular: RRR with S1 S2. No murmurs Abdomen: Soft, non-tender, non-distended. No obvious abdominal masses. Extremities: 2+ foot edema to knees. Neuro: Alert and oriented. No focal deficits. No facial asymmetry. MAE spontaneously. Psych: Responds to questions appropriately with normal affect.    EKG:  The  EKG was personally reviewed and demonstrates: 02/27/21 AF with HR at 95bpm Telemetry:   Telemetry was personally reviewed and demonstrates: 02/27/21 intermittently in and out of AF with rate control in the 70-90s   Relevant CV Studies:  TEE/DCCV 08/2020:  1. Left ventricular ejection fraction, by estimation, is 60 to 65%. The  left ventricle has normal function. There is moderate left ventricular  hypertrophy.  2. Right ventricular systolic function is moderately reduced. The right  ventricular size is mildly enlarged.  3. No left atrial/left atrial appendage thrombus was detected.  4. Right atrial size was mild to moderately dilated.  5. The mitral valve is grossly normal. Trivial mitral valve  regurgitation.  6. The aortic valve is tricuspid. Aortic valve regurgitation is not  visualized.  7. There is mild (Grade II) plaque.   Conclusion(s)/Recommendation(s): No LA/LAA thrombus identified. Successful  cardioversion performed with restoration of normal sinus rhythm.   Echocardiogram 07/20/2020:  1. Left ventricular ejection fraction, by estimation, is 60 to 65%. The  left ventricle has normal function. The left ventricle has no regional  wall motion abnormalities. There is moderate concentric left ventricular  hypertrophy. Left ventricular  diastolic parameters are consistent with Grade II diastolic dysfunction  (pseudonormalization). Elevated left atrial pressure. There is the  interventricular septum is flattened in systole and diastole, consistent  with right ventricular pressure and volume  overload.  2. Right ventricular systolic function is moderately reduced. The right  ventricular size is moderately enlarged. There is severely elevated  pulmonary artery systolic pressure. The estimated right ventricular  systolic pressure is 05.3 mmHg.  3. Right atrial size was mildly dilated.  4. The mitral valve is grossly normal. Mild mitral valve regurgitation.  No evidence of mitral stenosis.  5. Tricuspid valve regurgitation is mild to moderate.  6. The  aortic valve is tricuspid. Aortic valve regurgitation is not  visualized. Mild aortic valve sclerosis is present, with no evidence of  aortic valve stenosis.  7. The inferior vena cava is dilated in size with >50% respiratory  variability, suggesting right atrial pressure of 8 mmHg.   Comparison(s): Changes from prior study are noted. RVSP improved from  prior study.   Myoview stress test 01/05/2017:  The left ventricular ejection fraction is moderately decreased (30-44%).  Nuclear stress EF: 44%.  There was no ST segment deviation noted during stress.  This is a low risk study.   Low risk stress nuclear study with normal perfusion and normal left ventricular regional and global systolic function. Findings suggest nonischemic cardiomyopathy, but cannot exclude "balanced ischemia" in this patient with history of CAD and CABG.  Laboratory Data:  Chemistry Recent Labs  Lab 02/27/2021 1812 02/14/2021 2110 02/27/21 0213  NA 141 143 142  K 4.0 3.9 3.9  CL 104  --  108  CO2 29  --  27  GLUCOSE 107*  --  114*  BUN 24*  --  24*  CREATININE 1.61*  --  1.55*  CALCIUM 8.8*  --  8.4*  GFRNONAA 41*  --  43*  ANIONGAP 8  --  7    Total Protein  Date Value Ref Range Status  02/27/2021 5.9 (L) 6.5 - 8.1 g/dL Final   Albumin  Date Value Ref Range Status  02/27/2021 2.6 (L) 3.5 - 5.0 g/dL Final   AST  Date Value Ref Range Status  02/27/2021 40 15 - 41 U/L Final   ALT  Date Value Ref Range Status  02/27/2021 26 0 -  44 U/L Final   Alkaline Phosphatase  Date Value Ref Range Status  02/27/2021 83 38 - 126 U/L Final   Total Bilirubin  Date Value Ref Range Status  02/27/2021 1.4 (H) 0.3 - 1.2 mg/dL Final   Hematology Recent Labs  Lab 02/25/2021 1812 02/20/2021 2110 02/27/21 0213  WBC 8.6  --  5.9  RBC 4.79  --  4.21*  HGB 13.3 12.6* 11.8*  HCT 43.2 37.0* 37.4*  MCV 90.2  --  88.8  MCH 27.8  --  28.0  MCHC 30.8  --  31.6  RDW 19.2*  --  19.1*  PLT 231  --  180   Cardiac  EnzymesNo results for input(s): TROPONINI in the last 168 hours. No results for input(s): TROPIPOC in the last 168 hours.  BNP Recent Labs  Lab 02/20/2021 1812  BNP 1,845.5*    DDimer No results for input(s): DDIMER in the last 168 hours. TSH:  Lab Results  Component Value Date   TSH 12.388 (H) 02/27/2021   Lipids: Lab Results  Component Value Date   CHOL 95 07/21/2020   HDL 28 (L) 07/21/2020   LDLCALC 52 07/21/2020   TRIG 128 12/26/2020   CHOLHDL 3.4 07/21/2020   HgbA1c: Lab Results  Component Value Date   HGBA1C 6.4 (H) 07/20/2020    Radiology/Studies:  DG Chest Portable 1 View  Result Date: 02/23/2021 CLINICAL DATA:  Shortness of breath EXAM: PORTABLE CHEST 1 VIEW COMPARISON:  12/26/2020 FINDINGS: Prior CABG. Moderate to large left effusion. Small right pleural effusion. Diffuse airspace disease throughout the lungs. Mild cardiomegaly. Underlying COPD. No acute bony abnormality. IMPRESSION: Borderline heart size. Bilateral pleural effusions, left greater than right. Bilateral airspace disease, likely edema. Underlying COPD. Electronically Signed   By: Rolm Baptise M.D.   On:  18:34   Assessment and Plan:   1. Acute on chronic diastolic CHF: -Presented with a month hx of worsening SOB/LE edema found to be markedly fluid volume overloaded on presentation with a BNP 1845 and CXR consistent with CHF  -Last echocardiogram with LVEF at 60-65% with severe pulmonary mixed venous and arterial hypertension  -HsT 90>>>87 with no c/o anginal symptoms>>>liklely due to demand ischemia and marked fluid volume overload -Creatinine improved with diuresis>>continue IV Lasix 80mg  BID and follow renal function closely  -May need OP diuretic adjustment to torsemide   2. Hypoxic respiratory failure: -Pt presented to [facility] with worsening SOB and LE/abdomial edema found to be profoundly hypoxic on presentation requiring Bipap ventilation.  -ABG>>pH: 7.4, pCO2: 46, HCO3: 31, O2  saturation: 100 -Plan to wean Bipap to [place]>>tolerating well -Management per IM    3. Paroxsymal atrial fibrillation: -Appears to be intermittently going in and out of AF with rate control -Previously underwent TEE/DCCV to NSR and has been maintained on reduced dose Eliquis given age and renal function. He is not on rate controlling medications due to hx of AF with slow ventricular response -Hb stable at 11.8  3. Mixed pulmonary venous and arterial hypertension: -Stable on last echocardiogram  -Followed by Dr. Lake Bells  4. CAD s/p CABG 1996: -No complaints of anginal symptoms -Continue current regimen with statin -No beta blocker with hx of bradycardia -No ASA in the setting of Eliquis    5. CKD stage III: -Creatinine above baseline on presentation at 2.18>>improved now to 1.55 with diuresis -Continue to monitor renal function closely  -Follow with daily BMET   6. HTN: -Elevated, 162/85>>163/89>>157/81 -On clonidine, benazepril, amlodipine  -Up titrate amlodipine  to 10mg  QD   7. HLD: -Last LDL, 52 on 07/21/2020 -Continue statin, Zetia  8. DM2:  -HbA1c, 6.4 -SSI for glucose control per IM   9. Hypothyroidism: -TSH, 12.388 this admission>>previously 3.5 on 07/21/2020 -Needs synthroid adjustment per IM    For questions or updates, please contact [place] Please consult [email or web address] for contact info under Cardiology/STEMI.   SignedKathyrn Drown NP-C [facility] Pager: [phone] 02/27/2021 1:05 PM

## 2021-02-27 NOTE — Progress Notes (Addendum)
[facility] Progress Note  Patient: [name]    RCV:[id number]  DOA: 02/22/2021     Date of Service: the patient was seen and examined on 02/27/2021  Brief hospital course: Past medical history of chronic CHF, chronic A. fib, HTN, CKD 3B, COPD.  Presents with complaints of worsening shortness of breath found to have acute on chronic diastolic CHF with acute hypoxic respiratory failure. Currently plan is continue diuresis.  Assessment and Plan: 1.  Acute on chronic diastolic CHF Severe pulmonary hypertension mixed venous and arterial Acute hypoxic respiratory failure Currently needing BiPAP. Saturation dropped to 80s off of BiPAP. We will repeat chest x-ray tomorrow. Continue IV diuresis. Continue as needed BiPAP.  2.  Paroxysmal A. fib Currently rate controlled. Continue Eliquis.  3.  CAD S/P CABG Elevated troponin Elevated troponin likely demand ischemia. No evidence of acute ischemia right now. Management per cardiology.  4.  Accelerated hypertension Potentially can contribute to patient's current presentation. Blood pressure significantly elevated. IV hydralazine and as needed nitroglycerin while the patient is NPO. Continuing home regimen which includes clonidine, benazepril, Norvasc.  5.  HLD Continue Zetia and statin.  6.  Type 2 diabetes mellitus, uncontrolled with hypoglycemia in setting of poor p.o. intake along with chronic kidney disease and without long-term insulin use. Hemoglobin A1c 6.4. Continue sliding scale insulin.  7.  Hypothyroidism. TSH significantly elevated. We will recheck free T4. Depending on free T4 we will adjust Synthroid dose.  8.  Mixed pulmonary and venous arterial hypertension. Patient follows up with cardiology outpatient. Currently stable.  Monitor.  9.  COPD. OSA No evidence of exacerbation. Saturation above 88% is adequate. On CPAP at home. Currently on BiPAP as needed.  10.  Anemia of chronic kidney  disease. Single function stable. Monitor.  11.  CKD 3B.  Intermittently worsening to CKD stage IV. History of renal mass. History of nephrectomy. Renal function currently stable.  Monitor.  Diet: Mechanical soft diet. DVT Prophylaxis:   apixaban (ELIQUIS) tablet 2.5 mg Start: 02/14/2021 2330 apixaban (ELIQUIS) tablet 2.5 mg    Advance goals of care discussion: Full code  Family Communication: no family was present at bedside, at the time of interview.   Disposition:  Status is: Inpatient  Remains inpatient appropriate because:IV treatments appropriate due to intensity of illness or inability to take PO  Dispo: The patient is from: Home              Anticipated d/c is to: SNF              Patient currently is not medically stable to d/c.   Difficult to place patient   Subjective: No nausea no vomiting or no fever no chills.  No chest pain.  Abdominal pain.  No diarrhea no constipation.  Physical Exam:  General: Appear in mild distress, no Rash; Oral Mucosa Clear, moist. no Abnormal Neck Mass Or lumps, Conjunctiva normal  Cardiovascular: S1 and S2 Present, no Murmur, Respiratory: increased respiratory effort, Bilateral Air entry present and bilateral  Crackles, no wheezes Abdomen: Bowel Sound present, Soft and no tenderness Extremities: bilateral  Pedal edema Neurology: alert and oriented to time, place, and person affect appropriate. no new focal deficit Gait not checked due to patient safety concerns  Vitals:   02/27/21 1430 02/27/21 1500 02/27/21 1530 02/27/21 1556  BP:  (!) 152/69    Pulse:  85    Resp:  (!) 24    Temp:      TempSrc:  SpO2: 94% 94% 97% (!) 89%    Intake/Output Summary (Last 24 hours) at 02/27/2021 1832 Last data filed at 02/27/2021 1755 Gross per 24 hour  Intake --  Output 2675 ml  Net -2675 ml   There were no vitals filed for this visit.  Data Reviewed: I have personally reviewed and interpreted daily labs, tele strips, imaging. I  reviewed all nursing notes, pharmacy notes, vitals, pertinent old records I have discussed plan of care as described above with RN and patient/family.  CBC: Recent Labs  Lab 02/10/2021 1812 02/04/2021 2110 02/27/21 0213  WBC 8.6  --  5.9  NEUTROABS 6.0  --   --   HGB 13.3 12.6* 11.8*  HCT 43.2 37.0* 37.4*  MCV 90.2  --  88.8  PLT 231  --  825   Basic Metabolic Panel: Recent Labs  Lab 02/14/2021 1812 02/25/2021 2110 02/27/21 0213  NA 141 143 142  K 4.0 3.9 3.9  CL 104  --  108  CO2 29  --  27  GLUCOSE 107*  --  114*  BUN 24*  --  24*  CREATININE 1.61*  --  1.55*  CALCIUM 8.8*  --  8.4*  MG  --   --  2.1    Studies: No results found.  Scheduled Meds: . [START ON 02/28/2021] amLODipine  10 mg Oral Daily  . apixaban  2.5 mg Oral BID  . benazepril  40 mg Oral Daily  . cloNIDine  0.1 mg Oral BID  . ezetimibe  10 mg Oral Daily  . furosemide  80 mg Intravenous Q8H  . icosapent Ethyl  2 g Oral BID  . insulin aspart  0-9 Units Subcutaneous TID WC  . insulin glargine  15 Units Subcutaneous QPM  . [START ON 02/28/2021] levothyroxine  112 mcg Oral QAC breakfast  . nitroGLYCERIN  0.5 inch Topical Q6H  . potassium chloride  10 mEq Oral BID  . rosuvastatin  20 mg Oral Daily   Continuous Infusions: PRN Meds: acetaminophen **OR** acetaminophen, hydrALAZINE, polyvinyl alcohol  Time spent: 35 minutes  Author: Berle Mull, MD [facility] 02/27/2021 6:32 PM  To reach On-call, see care teams to locate the attending and reach out via [email or web address]. Between 7PM-7AM, please contact night-coverage If you still have difficulty reaching the attending provider, please page the [facility] (Director on Call) for [facility] on amion for assistance.

## 2021-02-27 NOTE — Evaluation (Signed)
Physical Therapy Evaluation Patient Details Name: [name] MRN: [id number] DOB: June 07, 1932 Today's Date: 02/27/2021   History of Present Illness  Pt is an 87 y.o. male admitted 02/16/2021 with worsening SOB, peripheral edema; recently admitted 12/2020 for COVID and CHF with progressively worse SOB since return home. Workup for acute respiratory failure secondary to decompensated HF; pt requiring BiPAP. PMH includes CHF, severe pulmonary HTN, afib, HTN, CKD, COPD, DM2, anemia.    Clinical Impression  Pt presents with an overall decrease in functional mobility secondary to above. PTA, pt mod indep with intermittent use of SPC. Today, pt tolerated short bouts of standing activity with min guard for balance. Mobility limited by increased WOB with minimal exertion (see values below). Pt would benefit from continued acute PT services to maximize functional mobility and independence prior to d/c with HHPT services.   SpO2 86-92% on 13L O2 HFNC with activity SpO2 96% on 10L O2 HFNC at rest post-session    Follow Up Recommendations Home health PT;Supervision for mobility/OOB (may not need pending progress)    Equipment Recommendations   (TBD)    Recommendations for Other Services       Precautions / Restrictions Precautions Precautions: Fall;Other (comment) Precaution Comments: Watch SpO2, on HFNC Restrictions Weight Bearing Restrictions: No      Mobility  Bed Mobility Overal bed mobility: Needs Assistance Bed Mobility: Supine to Sit     Supine to sit: Min assist;HOB elevated     General bed mobility comments: MinA for HHA to scoot hips to EOB; increased SOB with bed mobility    Transfers Overall transfer level: Needs assistance Equipment used: None Transfers: Sit to/from Stand Sit to Stand: Min guard         General transfer comment: Able to stand from EOB and recliner without assist, min guard for balance  Ambulation/Gait Ambulation/Gait assistance: Min guard Gait  Distance (Feet): 4 Feet Assistive device: None Gait Pattern/deviations: Step-through pattern;Decreased stride length;Trunk flexed Gait velocity: Decreased   General Gait Details: Slow, mostly steady steps without DME, min guard for balance; further mobility deferred secondary to significant SOB  Stairs            Wheelchair Mobility    Modified Rankin (Stroke Patients Only)       Balance Overall balance assessment: Needs assistance   Sitting balance-Leahy Scale: Fair       Standing balance-Leahy Scale: Fair Standing balance comment: Able to stand, take steps, and perform posterior pericare without UE support                             Pertinent Vitals/Pain Pain Assessment: No/denies pain    Home Living Family/patient expects to be discharged to:: Private residence Living Arrangements: Spouse/significant other Available Help at Discharge: Family;Available 24 hours/day Type of Home: House Home Access: Stairs to enter   [employer] of Steps: 2-3 Home Layout: One level Home Equipment: Walker - 2 wheels;Shower seat Additional Comments: Does not wear home O2    Prior Function Level of Independence: Independent with assistive device(s)         Comments: Mod indep with intermittent use of SPC; sponge bathes at sink (? - per chart review, did not specify with patient). Wife assists with household tasks     Hand Dominance        Extremity/Trunk Assessment   Upper Extremity Assessment Upper Extremity Assessment: Generalized weakness    Lower Extremity Assessment Lower Extremity Assessment:  Generalized weakness       Communication   Communication: HOH  Cognition Arousal/Alertness: Awake/alert Behavior During Therapy: WFL for tasks assessed/performed;Flat affect Overall Cognitive Status: Within Functional Limits for tasks assessed                                 General Comments: WFL for simple tasks; flat affect  may be related to [facility]. Pt laughing appropriately, affect improving by end of session      General Comments General comments (skin integrity, edema, etc.): RT increased HFNC to 13 L before pt OOB with PT; SpO2 86-92% on 13L O2 [place]; maintaining 96% on 10L O2 HFNC at end of session    Exercises     Assessment/Plan    PT Assessment Patient needs continued PT services  PT Problem List Decreased strength;Decreased activity tolerance;Decreased balance;Decreased mobility;Cardiopulmonary status limiting activity       PT Treatment Interventions DME instruction;Gait training;Stair training;Functional mobility training;Therapeutic activities;Therapeutic exercise;Balance training;Patient/family education    PT Goals (Current goals can be found in the Care Plan section)  Acute Rehab PT Goals Patient Stated Goal: Return home PT Goal Formulation: With patient Time For Goal Achievement: 03/13/21 Potential to Achieve Goals: Good    Frequency Min 3X/week   Barriers to discharge        Co-evaluation               AM-PAC PT "6 Clicks" Mobility  Outcome Measure Help needed turning from your back to your side while in a flat bed without using bedrails?: None Help needed moving from lying on your back to sitting on the side of a flat bed without using bedrails?: A Little Help needed moving to and from a bed to a chair (including a wheelchair)?: A Little Help needed standing up from a chair using your arms (e.g., wheelchair or bedside chair)?: A Little Help needed to walk in hospital room?: A Little Help needed climbing 3-5 steps with a railing? : A Little 6 Click Score: 19    End of Session Equipment Utilized During Treatment: Gait belt Activity Tolerance: Patient tolerated treatment well;Patient limited by fatigue Patient left: in chair;with call bell/phone within reach;with chair alarm set Nurse Communication: Mobility status PT Visit Diagnosis: Other abnormalities of gait and  mobility (R26.89);Muscle weakness (generalized) (M62.81)    Time: 5859-2924 PT Time Calculation (min) (ACUTE ONLY): 27 min   Charges:   PT Evaluation $PT Eval Moderate Complexity: 1 Mod PT Treatments $Therapeutic Activity: 8-22 mins   Mabeline Caras, PT, DPT Acute Rehabilitation Services  Pager [phone] Office [place] 02/27/2021, 5:12 PM

## 2021-02-27 NOTE — Progress Notes (Signed)
Orthopedic Tech Progress Note Patient Details:  [name] [birth year] [id number]  Ortho Devices Type of Ortho Device: Louretta Parma boot Ortho Device/Splint Location: Bilateral Ortho Device/Splint Interventions: Application,Ordered   Post Interventions Patient Tolerated: Well   [name] A Chandra Feger 02/27/2021, 7:54 PM

## 2021-02-27 NOTE — Progress Notes (Signed)
Pt arrived to unit with right unna boot.  Removed by this RN and bedside nurse. Weeping  Observed.   Xeroform and ABD pad dressing applied on right  By bedside RN.  Callie PA notified.   Ortho tech called by bedside RN.

## 2021-02-27 NOTE — Progress Notes (Signed)
RT attempted to trial patient off of bipap. Patient's O2 dropped to 85 on a 15L [place]. RT placed patient back on bipap at this time.

## 2021-02-27 NOTE — Plan of Care (Signed)
  Problem: Education: Goal: Ability to demonstrate management of disease process will improve Outcome: Progressing Goal: Ability to verbalize understanding of medication therapies will improve Outcome: Progressing   Problem: Cardiac: Goal: Ability to achieve and maintain adequate cardiopulmonary perfusion will improve Outcome: Progressing   

## 2021-02-27 NOTE — Progress Notes (Signed)
Inpatient Diabetes Program Recommendations  AACE/ADA: New Consensus Statement on Inpatient Glycemic Control   Target Ranges:  Prepandial:   less than 140 mg/dL      Peak postprandial:   less than 180 mg/dL (1-2 hours)      Critically ill patients:  140 - 180 mg/dL   Results for MINAS, BONSER (MRN [id number]) as of 02/27/2021 12:52  Ref. Range 02/27/2021 01:11 02/27/2021 07:30 02/27/2021 07:55 02/27/2021 12:05 02/27/2021 12:44  Glucose-Capillary Latest Ref Range: 70 - 99 mg/dL 108 (H) 82 77 57 (L) 105 (H)  Results for TRASEAN, DELIMA (MRN [id number]) as of 02/27/2021 12:52  Ref. Range 07/20/2020 16:24  Hemoglobin A1C Latest Ref Range: 4.8 - 5.6 % 6.4 (H)   Review of Glycemic Control  Diabetes history: DM2 Outpatient Diabetes medications: Lantus 15 units QPM, Humalog 6-8 units TID with meals Current orders for Inpatient glycemic control: Lantus 15 units QPM, Novolog 0-9 units TID with meals  Inpatient Diabetes Program Recommendations:    Insulin: Noted glucose down to 57 mg/dl today. Patient received Lantus 15 units at 1:12 am on 02/27/21. Please consider decreasing Lantus to 7 units QPM.  Thanks, Barnie Alderman, RN, MSN, CDE Diabetes Coordinator Inpatient Diabetes Program [phone] (Team Pager from 8am to 5pm)

## 2021-02-27 NOTE — ED Notes (Signed)
Attempted report x1. 

## 2021-02-27 NOTE — ED Notes (Addendum)
Tiffany - RT at bedside.

## 2021-02-27 NOTE — ED Notes (Signed)
Updated Dr. Hal Hope on pt's increased O2 requirement on Bipap. Advised to give lasix 80mg  IVP now.

## 2021-02-27 NOTE — Consult Note (Signed)
   [facility] Inpatient Consult   02/27/2021  [name] 01-03-1932 [id number]   [place] Organization [ACO] Patient: [name]  Primary Care Provider:  Dr. [name], [facility], this office is listed to provide the Transition of Care [TOC] follow up calls and appointments.    Patient is currently active with [facility] for chronic disease management services.  Patient has been engaged by a [facility] and has been referred for pharmacy for medication assistance, noted .  Our community based plan of care has focused on disease management and community resource support.  Discussed in unit progression meeting via conference call today of High risk scores for unplanned readmission risk.  Plan: Continue to follow for disposition and TOC needs.  Made Inpatient Transition Of Care [TOC] team member to make aware that [facility] following, as well as a history with [facility], and [facility].   Of note, [facility] Care Management services does not replace or interfere with any services that are needed or arranged by inpatient [facility] care management team.  For additional questions or referrals please contact:  Natividad Brood, RN BSN [facility] Liaison  [phone] business mobile phone Toll free office [phone]  Fax number: [phone] [email or web address]

## 2021-02-27 NOTE — ED Notes (Signed)
Dr. Hal Hope advised pt has had 445ml of urine output.

## 2021-02-28 ENCOUNTER — Inpatient Hospital Stay (HOSPITAL_COMMUNITY): Payer: Medicare Other

## 2021-02-28 ENCOUNTER — Encounter (HOSPITAL_COMMUNITY): Payer: Self-pay | Admitting: Internal Medicine

## 2021-02-28 ENCOUNTER — Other Ambulatory Visit: Payer: Self-pay | Admitting: *Deleted

## 2021-02-28 DIAGNOSIS — I272 Pulmonary hypertension, unspecified: Secondary | ICD-10-CM | POA: Diagnosis not present

## 2021-02-28 DIAGNOSIS — I5033 Acute on chronic diastolic (congestive) heart failure: Secondary | ICD-10-CM | POA: Diagnosis not present

## 2021-02-28 LAB — GLUCOSE, CAPILLARY
Glucose-Capillary: 104 mg/dL — ABNORMAL HIGH (ref 70–99)
Glucose-Capillary: 130 mg/dL — ABNORMAL HIGH (ref 70–99)
Glucose-Capillary: 143 mg/dL — ABNORMAL HIGH (ref 70–99)
Glucose-Capillary: 187 mg/dL — ABNORMAL HIGH (ref 70–99)

## 2021-02-28 LAB — BASIC METABOLIC PANEL
Anion gap: 7 (ref 5–15)
BUN: 24 mg/dL — ABNORMAL HIGH (ref 8–23)
CO2: 30 mmol/L (ref 22–32)
Calcium: 8.6 mg/dL — ABNORMAL LOW (ref 8.9–10.3)
Chloride: 106 mmol/L (ref 98–111)
Creatinine, Ser: 1.53 mg/dL — ABNORMAL HIGH (ref 0.61–1.24)
GFR, Estimated: 44 mL/min — ABNORMAL LOW (ref >60)
Glucose, Bld: 116 mg/dL — ABNORMAL HIGH (ref 70–99)
Potassium: 3.6 mmol/L (ref 3.5–5.1)
Sodium: 143 mmol/L (ref 135–145)

## 2021-02-28 LAB — CBC
HCT: 39 % (ref 39.0–52.0)
Hemoglobin: 12.7 g/dL — ABNORMAL LOW (ref 13.0–17.0)
MCH: 28 pg (ref 26.0–34.0)
MCHC: 32.6 g/dL (ref 30.0–36.0)
MCV: 86.1 fL (ref 80.0–100.0)
Platelets: 206 10*3/uL (ref 150–400)
RBC: 4.53 MIL/uL (ref 4.22–5.81)
RDW: 18.9 % — ABNORMAL HIGH (ref 11.5–15.5)
WBC: 7.6 10*3/uL (ref 4.0–10.5)
nRBC: 0 % (ref 0.0–0.2)

## 2021-02-28 LAB — MAGNESIUM: Magnesium: 2.2 mg/dL (ref 1.7–2.4)

## 2021-02-28 LAB — T4, FREE: Free T4: 1.02 ng/dL (ref 0.61–1.12)

## 2021-02-28 MED ORDER — HYDRALAZINE HCL 25 MG PO TABS
25.0000 mg | ORAL_TABLET | Freq: Three times a day (TID) | ORAL | Status: DC
  Administered 2021-02-28 – 2021-03-01 (×3): 25 mg via ORAL
  Filled 2021-02-28 (×3): qty 1

## 2021-02-28 MED ORDER — POTASSIUM CHLORIDE CRYS ER 20 MEQ PO TBCR
40.0000 meq | EXTENDED_RELEASE_TABLET | Freq: Once | ORAL | Status: AC
  Administered 2021-02-28: 40 meq via ORAL
  Filled 2021-02-28: qty 2

## 2021-02-28 MED ORDER — DEXTROSE 50 % IV SOLN: 12.5000 g | INTRAVENOUS | Status: DC | PRN

## 2021-02-28 MED ORDER — POLYETHYLENE GLYCOL 3350 17 G PO PACK
17.0000 g | PACK | Freq: Every day | ORAL | Status: DC | PRN
  Administered 2021-02-28: 17 g via ORAL
  Filled 2021-02-28: qty 1

## 2021-02-28 NOTE — Progress Notes (Signed)
Heart Failure Nurse Navigator Progress Note  PCP: [name], MD PCP-Cardiologist: Debara Pickett, MD Admission Diagnosis: CHF Admitted from: home with spouse  Pt lying in bed on 13L HFNC with some increased respiratory effort noted. Pt stated he has been off BiPAP since this AM. Will continue to follow through hospitalization to see progression and need for HV TOC. Spoke with Sharee Pimple, cards NP regarding pt status/progression prognosis. Awaiting Dr. Harrell Gave to see today while on HFNC, pt will probably be here over the weekend, renal function improving, still on IV lasix with decent response, watching respiratory status.   Will plan reevaluate Monday.    Clinical Course:  Past Medical History:  Diagnosis Date  . CHF (congestive heart failure) ([place])   . Chronic kidney disease    tumor right kidney  . COPD (chronic obstructive pulmonary disease) ([place])   . Coronary artery disease   . Diabetes mellitus    type 2  . Hypertension   . Myocardial infarction ([place])    1997  . Right renal mass      Social History   Socioeconomic History  . Marital status: Married    Spouse name: Harve Spradley  . Number of children: 4  . Years of education: 67  . Highest education level: 11th grade  Occupational History  . Occupation: retired  Tobacco Use  . Smoking status: Former Smoker    Packs/day: 0.30    Years: 45.00    Pack years: 13.50    Types: Cigarettes    Quit date: 02/15/1997    Years since quitting: 24.0  . Smokeless tobacco: Never Used  Vaping Use  . Vaping Use: Never used  Substance and Sexual Activity  . Alcohol use: Not Currently    Alcohol/week: 13.0 standard drinks    Types: 1 Glasses of wine, 12 Shots of liquor per week    Comment: quit 25 yrs ago. mostly liquor on the weekends. About a pint/weekend.  . Drug use: No  . Sexual activity: Yes  Other Topics Concern  . Not on file  Social History Narrative  . Not on file   Social Determinants of Health   Financial Resource  Strain: Low Risk   . Difficulty of Paying Living Expenses: Not very hard  Food Insecurity: No Food Insecurity  . Worried About Charity fundraiser in the Last Year: Never true  . Ran Out of Food in the Last Year: Never true  Transportation Needs: No Transportation Needs  . Lack of Transportation (Medical): No  . Lack of Transportation (Non-Medical): No  Physical Activity: Not on file  Stress: Not on file  Social Connections: Not on file   Pricilla Holm, RN, BSN Heart Failure Nurse Navigator [phone]

## 2021-02-28 NOTE — Consult Note (Signed)
[place] Nurse Consult Note: Patient receiving care in [place]. Reason for Consult: right leg wounds Bilateral unna boots were placed according to Dr. Serita Grit order yesterday.  This morning upon my visit, I discovered the legs had unna boots placed yesterday after the patient's arrival to the hospital.  The patient tells me he has no wounds to either leg. I did not remove the unna boots.  Please continue the use of the unna boots are currently ordered. [place] nurse will not follow at this time.  Please re-consult the [place] team if needed.  Val Riles, RN, MSN, CWOCN, CNS-BC, pager [phone]

## 2021-02-28 NOTE — Progress Notes (Signed)
[facility] Progress Note  Patient: [name]    NWG:[id number]  DOA: 02/08/2021     Date of Service: the patient was seen and examined on 02/28/2021  Brief hospital course: Past medical history of chronic CHF, chronic A. fib, HTN, CKD 3B, COPD.  Presents with complaints of worsening shortness of breath found to have acute on chronic diastolic CHF with acute hypoxic respiratory failure. Currently plan is continue diuresis.  Assessment and Plan: 1.  Acute on chronic diastolic CHF Severe pulmonary hypertension mixed venous and arterial Acute on chronic hypoxic respiratory failure Currently needing BiPAP. Saturation dropped to 80s off of BiPAP. On baseline 3 LPM oxygen.  Currently requiring 15 L minimum to maintain adequate saturation but then develops distress therefore back on BiPAP. Suspect that patient has poor lung reserve given his recent Covid pneumonia. Continue IV diuresis. Continue as needed BiPAP.  2.  Paroxysmal A. fib Currently rate controlled. Continue Eliquis.  3.  CAD S/P CABG Elevated troponin Elevated troponin likely demand ischemia. No evidence of acute ischemia right now. Management per cardiology.  4.  Accelerated hypertension Potentially can contribute to patient's current presentation. Blood pressure significantly elevated. IV hydralazine and as needed nitroglycerin while the patient is NPO. Continuing home regimen which includes clonidine, Norvasc.  Holding benazepril given renal function.  5.  HLD Continue Zetia and statin.  6.  Type 2 diabetes mellitus, uncontrolled with hypoglycemia in setting of poor p.o. intake along with chronic kidney disease and without long-term insulin use. Hemoglobin A1c 6.4. Continue sliding scale insulin.  7.  Hypothyroidism. TSH significantly elevated. We will recheck free T4. Depending on free T4 we will adjust Synthroid dose.  8.  Mixed pulmonary and venous arterial hypertension. Patient follows up with  cardiology outpatient. Currently stable.  Monitor.  9.  COPD. OSA No evidence of exacerbation. Saturation above 88% is adequate. On CPAP at home. Currently on BiPAP as needed.  10.  Anemia of chronic kidney disease. Single function stable. Monitor.  11.  CKD 3B.  Intermittently worsening to CKD stage IV. History of renal mass. History of nephrectomy. Renal function currently stable.  Monitor.  Diet: Mechanical soft diet. DVT Prophylaxis:   apixaban (ELIQUIS) tablet 2.5 mg Start: 02/14/2021 2330 apixaban (ELIQUIS) tablet 2.5 mg    Advance goals of care discussion: Full code  Family Communication: no family was present at bedside, at the time of interview.   Disposition:  Status is: Inpatient  Remains inpatient appropriate because:IV treatments appropriate due to intensity of illness or inability to take PO  Dispo: The patient is from: Home              Anticipated d/c is to: SNF              Patient currently is not medically stable to d/c.   Difficult to place patient   Subjective: Seen on BiPAP.  No acute complaint.  Continues to have shortness of breath, no nausea and vomiting.  Physical Exam:  General: Appear in mild distress, no Rash; Oral Mucosa Clear, moist. no Abnormal Neck Mass Or lumps, Conjunctiva normal  Cardiovascular: S1 and S2 Present, no Murmur, Respiratory: increased respiratory effort, Bilateral Air entry present and bilateral  Crackles, no wheezes Abdomen: Bowel Sound present, Soft and no tenderness Extremities: trace Pedal edema Neurology: alert and oriented to time, place, and person affect appropriate. no new focal deficit Gait not checked due to patient safety concerns  Vitals:   02/28/21 1215 02/28/21 1400 02/28/21 1500  02/28/21 1824  BP: 140/77 (!) 149/82 139/75 (!) 167/87  Pulse: 77  83   Resp: 18     Temp: 98 F (36.7 C)  98.7 F (37.1 C)   TempSrc: Oral  Oral   SpO2: 97%     Weight:        Intake/Output Summary (Last 24 hours)  at 02/28/2021 1934 Last data filed at 02/28/2021 1841 Gross per 24 hour  Intake 490 ml  Output 1650 ml  Net -1160 ml   Filed Weights   02/28/21 0300  Weight: 87.8 kg    Data Reviewed: I have personally reviewed and interpreted daily labs, tele strips, imaging. I reviewed all nursing notes, pharmacy notes, vitals, pertinent old records I have discussed plan of care as described above with RN and patient/family.  CBC: Recent Labs  Lab 03/02/2021 1812 03/06/2021 2110 02/27/21 0213 02/28/21 0347  WBC 8.6  --  5.9 7.6  NEUTROABS 6.0  --   --   --   HGB 13.3 12.6* 11.8* 12.7*  HCT 43.2 37.0* 37.4* 39.0  MCV 90.2  --  88.8 86.1  PLT 231  --  180 989   Basic Metabolic Panel: Recent Labs  Lab 02/07/2021 1812 02/05/2021 2110 02/27/21 0213 02/28/21 0347  NA 141 143 142 143  K 4.0 3.9 3.9 3.6  CL 104  --  108 106  CO2 29  --  27 30  GLUCOSE 107*  --  114* 116*  BUN 24*  --  24* 24*  CREATININE 1.61*  --  1.55* 1.53*  CALCIUM 8.8*  --  8.4* 8.6*  MG  --   --  2.1 2.2    Studies: DG CHEST PORT 1 VIEW  Result Date: 02/28/2021 CLINICAL DATA:  Shortness of breath EXAM: PORTABLE CHEST 1 VIEW COMPARISON:  February 26, 2021; August 23, 2020 FINDINGS: There are partially loculated pleural effusions bilaterally. Fluid tracks along the right minor fissure. There is diffuse interstitial prominence which is consistent with interstitial pulmonary edema, potentially with underlying fibrosis. There is airspace consolidation in the left lower lobe and medial right base. There is cardiomegaly with pulmonary venous hypertension. Patient is status post coronary artery bypass grafting. No adenopathy. No bone lesions. IMPRESSION: Cardiomegaly with pulmonary vascular congestion. Interstitial edema with pleural effusions bilaterally, partially loculated. Overall appearance is felt to be indicative of a degree of congestive heart failure. Patient is status post coronary artery bypass grafting. Airspace opacity  in the left lower lobe and medial right base likely represents superimposed atelectasis and pneumonia, although alveolar edema in these areas is possible. Alveolar edema and pneumonia may present concurrently. Electronically Signed   By: Lowella Grip III M.D.   On: 02/28/2021 08:04    Scheduled Meds: . amLODipine  10 mg Oral Daily  . apixaban  2.5 mg Oral BID  . cloNIDine  0.1 mg Oral BID  . ezetimibe  10 mg Oral Daily  . furosemide  80 mg Intravenous Q8H  . hydrALAZINE  25 mg Oral Q8H  . insulin aspart  0-9 Units Subcutaneous TID WC  . levothyroxine  112 mcg Oral QAC breakfast  . rosuvastatin  20 mg Oral Daily   Continuous Infusions: PRN Meds: acetaminophen **OR** acetaminophen, dextrose, hydrALAZINE, polyethylene glycol, polyvinyl alcohol  Time spent: 35 minutes  Author: Berle Mull, MD [facility] 02/28/2021 7:34 PM  To reach On-call, see care teams to locate the attending and reach out via [email or web address]. Between 7PM-7AM, please contact night-coverage If you still  have difficulty reaching the attending provider, please page the [facility] (Director on Call) for [facility] on amion for assistance.

## 2021-02-28 NOTE — Evaluation (Signed)
Occupational Therapy Evaluation Patient Details Name: [name] MRN: [id number] DOB: [birth year] Today's Date: 02/28/2021    History of Present Illness Pt is an 87 y.o. male admitted 02/22/2021 with worsening SOB, peripheral edema; recently admitted 12/2020 for COVID and CHF with progressively worse SOB since return home. Workup for acute respiratory failure secondary to decompensated HF; pt requiring BiPAP. PMH includes CHF, severe pulmonary HTN, afib, HTN, CKD, COPD, DM2, anemia.   Clinical Impression   Evaluation limited due to RN request of bed level activity only, patient currently on bipap. Per chart review patient lives at home with spouse in a single level house with 2-3 steps to enter. Patient using cane intermittently and independent with self care. Patient demonstrate grossly intact AROM of B UEs, decreased strength. Able to participate in exercises listed below with brief desat to 89% recovered quickly to 90-92% on bipap. At rest prior to activity patient saturating 97-98%. Due to limited activity tolerance and cardiopulmonary status currently recommend ST rehab prior to D/C home. If patient progressing well may be able to D/C with [facility] + family support.    Follow Up Recommendations  SNF;Other (comment) (vs HH pending patient progress/ improving activity tolerance)    Equipment Recommendations  None recommended by OT       Precautions / Restrictions Precautions Precautions: Fall;Other (comment) Precaution Comments: Watch SpO2, on bipap Restrictions Weight Bearing Restrictions: No      Mobility Bed Mobility               General bed mobility comments: patient demo ability to elevate trunk from head of bed about 20 degrees with use of B rails    Transfers                 General transfer comment: deferred        ADL either performed or assessed with clinical judgement   ADL Overall ADL's : Needs assistance/impaired Eating/Feeding: Set up;Bed  level Eating/Feeding Details (indicate cue type and reason): based on intact AROM in B UEs Grooming: Set up;Bed level Grooming Details (indicate cue type and reason): based on intact AROM of B UEs Upper Body Bathing: Minimal assistance;Bed level   Lower Body Bathing: Maximal assistance;Bed level   Upper Body Dressing : Moderate assistance;Bed level Upper Body Dressing Details (indicate cue type and reason): based on patient activity tolerance with exercises and B UE AROM Lower Body Dressing: Total assistance;Bed level     Toilet Transfer Details (indicate cue type and reason): deferred, bed level activity only per RN due to on bipap Toileting- Clothing Manipulation and Hygiene: Total assistance;Bed level         General ADL Comments: evaluation limited RN request bed level activity only due to respiratory  status and pt on bipap. patient at 98-97% at  rest, with bed leve exercise desat to 90-92%                  Pertinent Vitals/Pain Pain Assessment: Faces Faces Pain Scale: No hurt     Hand Dominance Right   Extremity/Trunk Assessment Upper Extremity Assessment Upper Extremity Assessment: Generalized weakness   Lower Extremity Assessment Lower Extremity Assessment: Defer to PT evaluation       Communication Communication Communication: HOH   Cognition Arousal/Alertness: Awake/alert Behavior During Therapy: WFL for tasks assessed/performed;Flat affect Overall Cognitive Status: Within Functional Limits for tasks assessed  General Comments: following verbal directions appropriately, conversation limited 2* bipap      Exercises Exercises: General Upper Extremity;General Lower Extremity;Other exercises General Exercises - Upper Extremity Shoulder Flexion: AROM;Both;20 reps;Supine Shoulder ABduction: AROM;Both;20 reps;Supine Elbow Flexion: AROM;Both;20 reps;Supine Elbow Extension: AROM;Both;20 reps;Supine Digit  Composite Flexion: AROM;Both;20 reps;Supine Composite Extension: AROM;Both;Supine;Other (comment) (20) General Exercises - Lower Extremity Ankle Circles/Pumps: AROM;Both;20 reps;Supine Other Exercises Other Exercises: trunk elevation from head of bed with use of hands on B bed rails x20 reps. desat briefly to 89% on bipap but recovered quickly        Home Living Family/patient expects to be discharged to:: Private residence Living Arrangements: Spouse/significant other Available Help at Discharge: Family;Available 24 hours/day Type of Home: House Home Access: Stairs to enter [employer] of Steps: 2-3   Home Layout: One level     Bathroom Shower/Tub: Occupational psychologist: Standard Bathroom Accessibility: Yes How Accessible: Accessible via walker Home Equipment: [place] - 2 wheels;Shower seat   Additional Comments: Does not wear home O2      Prior Functioning/Environment Level of Independence: Independent with assistive device(s)        Comments: Mod indep with intermittent use of SPC; sponge bathes at sink (per PT eval). Wife assists with household tasks        OT Problem List: Decreased strength;Decreased activity tolerance;Cardiopulmonary status limiting activity      OT Treatment/Interventions: Self-care/ADL training;Therapeutic exercise;Energy conservation;Therapeutic activities;Patient/family education;Balance training;DME and/or AE instruction    OT Goals(Current goals can be found in the care plan section) Acute Rehab OT Goals Patient Stated Goal: agreeable to exercises OT Goal Formulation: With patient Time For Goal Achievement: 03/14/21 Potential to Achieve Goals: Good  OT Frequency: Min 2X/week    AM-PAC OT "6 Clicks" Daily Activity     Outcome Measure Help from another person eating meals?: A Little Help from another person taking care of personal grooming?: A Little Help from another person toileting, which includes using  toliet, bedpan, or urinal?: Total Help from another person bathing (including washing, rinsing, drying)?: A Lot Help from another person to put on and taking off regular upper body clothing?: A Lot Help from another person to put on and taking off regular lower body clothing?: Total 6 Click Score: 12   End of Session Equipment Utilized During Treatment: Oxygen Nurse Communication: Other (comment) (tolerance of activity)  Activity Tolerance: Patient limited by fatigue Patient left: in bed;with call bell/phone within reach;with bed alarm set  OT Visit Diagnosis: Other abnormalities of gait and mobility (R26.89);Muscle weakness (generalized) (M62.81)                Time: 1037-1050 OT Time Calculation (min): 13 min Charges:  OT General Charges $OT Visit: 1 Visit OT Evaluation $OT Eval Low Complexity: [place] OT OT pager: [place] 02/28/2021, 12:25 PM

## 2021-02-28 NOTE — Progress Notes (Signed)
Monitor room called stating pt had 6 beat run of Vtach. MD made aware. Pt asymptomatic;No new orders placed; Will continue to monitor.

## 2021-02-28 NOTE — Progress Notes (Signed)
Progress Note  Patient Name: [name] Date of Encounter: 02/28/2021  Primary Cardiologist:  Dr. Debara Pickett, MD   Subjective   Doing well with no complaints. Remains on Bipap>>trial off?   Inpatient Medications    Scheduled Meds: . amLODipine  10 mg Oral Daily  . apixaban  2.5 mg Oral BID  . benazepril  40 mg Oral Daily  . cloNIDine  0.1 mg Oral BID  . ezetimibe  10 mg Oral Daily  . furosemide  80 mg Intravenous Q8H  . icosapent Ethyl  2 g Oral BID  . insulin aspart  0-9 Units Subcutaneous TID WC  . levothyroxine  112 mcg Oral QAC breakfast  . potassium chloride  10 mEq Oral BID  . rosuvastatin  20 mg Oral Daily   Continuous Infusions:  PRN Meds: acetaminophen **OR** acetaminophen, hydrALAZINE, polyvinyl alcohol   Vital Signs    Vitals:   02/28/21 0200 02/28/21 0300 02/28/21 0400 02/28/21 0801  BP: (!) 171/92 (!) 150/80 (!) 145/75 (!) 167/90  Pulse:   80 96  Resp:   19 18  Temp:   98 F (36.7 C) 97.7 F (36.5 C)  TempSrc:   Oral Oral  SpO2: 91% 93% 97% 91%  Weight:  87.8 kg      Intake/Output Summary (Last 24 hours) at 02/28/2021 0942 Last data filed at 02/28/2021 0420 Gross per 24 hour  Intake 240 ml  Output 2150 ml  Net -1910 ml   Filed Weights   02/28/21 0300  Weight: 87.8 kg    Physical Exam   General: Elderly, NAD Neck: Negative for carotid bruits. No JVD Lungs:Clear to ausculation bilaterally. No wheezes, rales, or rhonchi. Breathing is unlabored>>remains on Bipap  Cardiovascular: RRR with S1 S2. No murmurs Abdomen: Soft, non-tender, non-distended. No obvious abdominal masses. Extremities: 2+ BLE edema. Radial pulses 2+ bilaterally Neuro: Alert and oriented. No focal deficits. No facial asymmetry. MAE spontaneously. Psych: Responds to questions appropriately with normal affect.    Labs    Chemistry Recent Labs  Lab 02/23/2021 1812 02/23/2021 2110 02/27/21 0213 02/28/21 0347  NA 141 143 142 143  K 4.0 3.9 3.9 3.6  CL 104  --  108  106  CO2 29  --  27 30  GLUCOSE 107*  --  114* 116*  BUN 24*  --  24* 24*  CREATININE 1.61*  --  1.55* 1.53*  CALCIUM 8.8*  --  8.4* 8.6*  PROT  --   --  5.9*  --   ALBUMIN  --   --  2.6*  --   AST  --   --  40  --   ALT  --   --  26  --   ALKPHOS  --   --  83  --   BILITOT  --   --  1.4*  --   GFRNONAA 41*  --  43* 44*  ANIONGAP 8  --  7 7     Hematology Recent Labs  Lab 02/11/2021 1812  2110 02/27/21 0213 02/28/21 0347  WBC 8.6  --  5.9 7.6  RBC 4.79  --  4.21* 4.53  HGB 13.3 12.6* 11.8* 12.7*  HCT 43.2 37.0* 37.4* 39.0  MCV 90.2  --  88.8 86.1  MCH 27.8  --  28.0 28.0  MCHC 30.8  --  31.6 32.6  RDW 19.2*  --  19.1* 18.9*  PLT 231  --  180 206    Cardiac EnzymesNo  results for input(s): TROPONINI in the last 168 hours. No results for input(s): TROPIPOC in the last 168 hours.   BNP Recent Labs  Lab 02/25/2021 1812  BNP 1,845.5*     DDimer No results for input(s): DDIMER in the last 168 hours.   Radiology    DG CHEST PORT 1 VIEW  Result Date: 02/28/2021 CLINICAL DATA:  Shortness of breath EXAM: PORTABLE CHEST 1 VIEW COMPARISON:  February 26, 2021; August 23, 2020 FINDINGS: There are partially loculated pleural effusions bilaterally. Fluid tracks along the right minor fissure. There is diffuse interstitial prominence which is consistent with interstitial pulmonary edema, potentially with underlying fibrosis. There is airspace consolidation in the left lower lobe and medial right base. There is cardiomegaly with pulmonary venous hypertension. Patient is status post coronary artery bypass grafting. No adenopathy. No bone lesions. IMPRESSION: Cardiomegaly with pulmonary vascular congestion. Interstitial edema with pleural effusions bilaterally, partially loculated. Overall appearance is felt to be indicative of a degree of congestive heart failure. Patient is status post coronary artery bypass grafting. Airspace opacity in the left lower lobe and medial right base  likely represents superimposed atelectasis and pneumonia, although alveolar edema in these areas is possible. Alveolar edema and pneumonia may present concurrently. Electronically Signed   By: Lowella Grip III M.D.   On: 02/28/2021 08:04   DG Chest Portable 1 View  Result Date: 02/28/2021 CLINICAL DATA:  Shortness of breath EXAM: PORTABLE CHEST 1 VIEW COMPARISON:  12/26/2020 FINDINGS: Prior CABG. Moderate to large left effusion. Small right pleural effusion. Diffuse airspace disease throughout the lungs. Mild cardiomegaly. Underlying COPD. No acute bony abnormality. IMPRESSION: Borderline heart size. Bilateral pleural effusions, left greater than right. Bilateral airspace disease, likely edema. Underlying COPD. Electronically Signed   By: Rolm Baptise M.D.   On: 02/18/2021 18:34   Telemetry    NSR/AF>>rate controlled - Personally Reviewed  ECG    No new tracing as of 02/28/21 - Personally Reviewed  Cardiac Studies   TEE/DCCV 08/2020:  1. Left ventricular ejection fraction, by estimation, is 60 to 65%. The  left ventricle has normal function. There is moderate left ventricular  hypertrophy.  2. Right ventricular systolic function is moderately reduced. The right  ventricular size is mildly enlarged.  3. No left atrial/left atrial appendage thrombus was detected.  4. Right atrial size was mild to moderately dilated.  5. The mitral valve is grossly normal. Trivial mitral valve  regurgitation.  6. The aortic valve is tricuspid. Aortic valve regurgitation is not  visualized.  7. There is mild (Grade II) plaque.   Conclusion(s)/Recommendation(s): No LA/LAA thrombus identified. Successful  cardioversion performed with restoration of normal sinus rhythm.   Echocardiogram 07/20/2020:  1. Left ventricular ejection fraction, by estimation, is 60 to 65%. The  left ventricle has normal function. The left ventricle has no regional  wall motion abnormalities. There is moderate  concentric left ventricular  hypertrophy. Left ventricular  diastolic parameters are consistent with Grade II diastolic dysfunction  (pseudonormalization). Elevated left atrial pressure. There is the  interventricular septum is flattened in systole and diastole, consistent  with right ventricular pressure and volume  overload.  2. Right ventricular systolic function is moderately reduced. The right  ventricular size is moderately enlarged. There is severely elevated  pulmonary artery systolic pressure. The estimated right ventricular  systolic pressure is 03.0 mmHg.  3. Right atrial size was mildly dilated.  4. The mitral valve is grossly normal. Mild mitral valve regurgitation.  No evidence of  mitral stenosis.  5. Tricuspid valve regurgitation is mild to moderate.  6. The aortic valve is tricuspid. Aortic valve regurgitation is not  visualized. Mild aortic valve sclerosis is present, with no evidence of  aortic valve stenosis.  7. The inferior vena cava is dilated in size with >50% respiratory  variability, suggesting right atrial pressure of 8 mmHg.   Comparison(s): Changes from prior study are noted. RVSP improved from  prior study.   Myoview stress test 01/05/2017:  The left ventricular ejection fraction is moderately decreased (30-44%).  Nuclear stress EF: 44%.  There was no ST segment deviation noted during stress.  This is a low risk study.  Low risk stress nuclear study with normal perfusion and normal left ventricular regional and global systolic function. Findings suggest nonischemic cardiomyopathy, but cannot exclude "balanced ischemia" in this patient with history of CAD and CABG.  Patient Profile     87 y.o. male with a hx of chronic diastolic CHF, mixed pulmonary venous and arterial hypertension followed by Dr. Lake Bells, CAD s/p CABG 1996, CKD stage II with a hx of partial nephrectomy 2018, HTN, HLD, RBBB, OSA with CPAP and chronic LE wounds followed by [facility]  wound care RN who is being seen today for the evaluation of CHF at the request of Dr. Hal Hope.  Assessment & Plan    1. Acute on chronic diastolic CHF: -Presented with a month hx of worsening SOB/LE edema found to be markedly fluid volume overloaded on presentation with a BNP 1845 and CXR consistent with CHF  -Last echocardiogram with LVEF at 60-65% with severe pulmonary mixed venous and arterial hypertension  -HsT 90>>>87 with no c/o anginal symptoms>>>liklely due to demand ischemia and marked fluid volume overload -Creatinine improved with diuresis>>continue IV Lasix 80mg  BID and follow renal function closely  -May need OP diuretic adjustment to torsemide -Weight, 193lb today  -I&IO, net negative 3.4L   2. Hypoxic respiratory failure: -Pt presented to [facility] with worsening SOB and LE/abdomial edema found to be profoundly hypoxic on presentation requiring Bipap ventilation.  -ABG>>pH: 7.4, pCO2: 46, HCO3: 31, O2 saturation: 100 -Plan to wean Bipap? -Management per IM    3. Paroxsymal atrial fibrillation: -Appears to be intermittently going in and out of AF with rate control -Previously underwent TEE/DCCV to NSR and has been maintained on reduced dose Eliquis given age and renal function. He is not on rate controlling medications due to hx of AF with slow ventricular response -Hb stable at 12.7  3. Mixed pulmonary venous and arterial hypertension: -Stable on last echocardiogram  -Followed by Dr. Lake Bells  4. CAD s/p CABG 1996: -No complaints of anginal symptoms -Continue current regimen with statin -No beta blocker with hx of bradycardia -No ASA in the setting of Eliquis    5. CKD stage III: -Creatinine above baseline on presentation at 2.18>>1.55>>1.53 today  -Continue to monitor renal function closely  -Follow with daily BMET   6. HTN: -Elevated, 167/90>>145/75>>150/80 -On home clonidine, benazepril, amlodipine  -Up titrate amlodipine to 10mg  QD, continue  clonidine -Hold benazepril in the setting of renal disease  -Start hydralazine 25mg  TID    7. HLD: -Last LDL, 52 on 07/21/2020 -Continue statin, Zetia  8. DM2:  -HbA1c, 6.4 -SSI for glucose control per IM   9. Hypothyroidism: -TSH, 12.388 this admission>>previously 3.5 on 07/21/2020 -Needs synthroid adjustment per IM   Signed, Kathyrn Drown NP-C [facility] Pager: [phone] 02/28/2021, 9:42 AM     For questions or updates, please contact   Please  consult [email or web address] for contact info under Cardiology/STEMI.

## 2021-02-28 NOTE — Patient Outreach (Signed)
[facility]) Care Management  02/28/2021  [name] 08/22/1932 [id number]   Care coordination   Telephone Assessment Initial Referral received : 08/15/20 Referral source:Hospital Liaision Referral reason: Complex care and disease management of Heart failure . Insurance: Medicare     Patient with inpatient admission to [facility] on 03/02/2021, DX; Acute Respiratory Failure , Acute on chronic heart failure.  In Basket message to [employer], [facility] Liaison.    Plan Will await communication and follow up discharge disposition for continued [facility] care management services.    Joylene Draft, RN, BSN  [place] Management Coordinator  [phone]- Mobile [phone]- Toll Free Main Office

## 2021-02-28 NOTE — Progress Notes (Signed)
   02/28/21 1059  Mobility  Activity Contraindicated/medical hold (Hold per RN, on Bipap)

## 2021-02-28 NOTE — Progress Notes (Signed)
PT Cancellation Note  Patient Details Name: [name] MRN: [id number] DOB: 08/30/1932   Cancelled Treatment:    Reason Eval/Treat Not Completed: Other (comment) (nursing in room, changing oxygen supplies, sats at 60%). Upon PT arrival RN and nurse tech switching oxygen supplies. PT will attempt to follow up when pt respiratory stauts is more stable.   Zenaida Niece 02/28/2021, 5:17 PM

## 2021-03-01 DIAGNOSIS — I5033 Acute on chronic diastolic (congestive) heart failure: Secondary | ICD-10-CM | POA: Diagnosis not present

## 2021-03-01 DIAGNOSIS — I272 Pulmonary hypertension, unspecified: Secondary | ICD-10-CM | POA: Diagnosis not present

## 2021-03-01 LAB — CBC
HCT: 39.3 % (ref 39.0–52.0)
Hemoglobin: 12.3 g/dL — ABNORMAL LOW (ref 13.0–17.0)
MCH: 27.5 pg (ref 26.0–34.0)
MCHC: 31.3 g/dL (ref 30.0–36.0)
MCV: 87.7 fL (ref 80.0–100.0)
Platelets: 194 10*3/uL (ref 150–400)
RBC: 4.48 MIL/uL (ref 4.22–5.81)
RDW: 18.7 % — ABNORMAL HIGH (ref 11.5–15.5)
WBC: 8.2 10*3/uL (ref 4.0–10.5)
nRBC: 0 % (ref 0.0–0.2)

## 2021-03-01 LAB — BASIC METABOLIC PANEL
Anion gap: 6 (ref 5–15)
BUN: 23 mg/dL (ref 8–23)
CO2: 31 mmol/L (ref 22–32)
Calcium: 8.6 mg/dL — ABNORMAL LOW (ref 8.9–10.3)
Chloride: 107 mmol/L (ref 98–111)
Creatinine, Ser: 1.55 mg/dL — ABNORMAL HIGH (ref 0.61–1.24)
GFR, Estimated: 43 mL/min — ABNORMAL LOW (ref >60)
Glucose, Bld: 154 mg/dL — ABNORMAL HIGH (ref 70–99)
Potassium: 4 mmol/L (ref 3.5–5.1)
Sodium: 144 mmol/L (ref 135–145)

## 2021-03-01 LAB — MAGNESIUM: Magnesium: 2.3 mg/dL (ref 1.7–2.4)

## 2021-03-01 LAB — GLUCOSE, CAPILLARY
Glucose-Capillary: 128 mg/dL — ABNORMAL HIGH (ref 70–99)
Glucose-Capillary: 154 mg/dL — ABNORMAL HIGH (ref 70–99)
Glucose-Capillary: 227 mg/dL — ABNORMAL HIGH (ref 70–99)
Glucose-Capillary: 182 mg/dL — ABNORMAL HIGH (ref 70–99)

## 2021-03-01 MED ORDER — ALBUMIN HUMAN 25 % IV SOLN
12.5000 g | Freq: Once | INTRAVENOUS | Status: AC
  Administered 2021-03-01: 12.5 g via INTRAVENOUS
  Filled 2021-03-01: qty 50

## 2021-03-01 MED ORDER — ISOSORBIDE MONONITRATE ER 30 MG PO TB24
30.0000 mg | ORAL_TABLET | Freq: Every day | ORAL | Status: DC
  Administered 2021-03-01 – 2021-03-02 (×2): 30 mg via ORAL
  Filled 2021-03-01 (×2): qty 1

## 2021-03-01 MED ORDER — HYDRALAZINE HCL 50 MG PO TABS
50.0000 mg | ORAL_TABLET | Freq: Three times a day (TID) | ORAL | Status: DC
  Administered 2021-03-01 – 2021-03-02 (×3): 50 mg via ORAL
  Filled 2021-03-01 (×3): qty 1

## 2021-03-01 NOTE — Progress Notes (Signed)
Progress Note  Patient Name: [name] Date of Encounter: 03/01/2021  [facility] Cardiologist: Pixie Casino, MD   Subjective   Bipap in place, but asking for it to be taken off so he can eat.   Inpatient Medications    Scheduled Meds: . amLODipine  10 mg Oral Daily  . apixaban  2.5 mg Oral BID  . cloNIDine  0.1 mg Oral BID  . ezetimibe  10 mg Oral Daily  . furosemide  80 mg Intravenous Q8H  . hydrALAZINE  50 mg Oral Q8H  . insulin aspart  0-9 Units Subcutaneous TID WC  . isosorbide mononitrate  30 mg Oral Daily  . levothyroxine  112 mcg Oral QAC breakfast  . rosuvastatin  20 mg Oral Daily   Continuous Infusions:  PRN Meds: acetaminophen **OR** acetaminophen, dextrose, hydrALAZINE, polyethylene glycol, polyvinyl alcohol   Vital Signs    Vitals:   03/01/21 0348 03/01/21 0355 03/01/21 0700 03/01/21 1200  BP: (!) 141/87  (!) 160/77 135/80  Pulse:    82  Resp: 20  20 19   Temp: 98.4 F (36.9 C)  (!) 97.5 F (36.4 C) 97.8 F (36.6 C)  TempSrc: Oral  Oral Oral  SpO2: 100%  92% 100%  Weight:  86.3 kg      Intake/Output Summary (Last 24 hours) at 03/01/2021 1301 Last data filed at 03/01/2021 0840 Gross per 24 hour  Intake 726 ml  Output 1225 ml  Net -499 ml   Last 3 Weights 03/01/2021 02/28/2021 01/02/2021  Weight (lbs) 190 lb 4.8 oz 193 lb 8 oz 189 lb 9.5 oz  Weight (kg) 86.32 kg 87.771 kg 86 kg      Telemetry    Sinus rhythm with brief NSVT, rare PVCs - Personally Reviewed  ECG    No new since 03/06/2021 - Personally Reviewed  Physical Exam   GEN: No acute distress. Bipap in place Neck: No JVD appreciated Cardiac: RRR, no murmurs, rubs, or gallops.  Respiratory: Bipap on, sounds largely clear GI: Soft, nontender, non-distended  MS: improving BL LE edema; No deformity. Neuro:  Nonfocal  Psych: Normal affect   Labs    High Sensitivity Troponin:   Recent Labs  Lab 03/03/2021 1812 02/08/2021 2012  TROPONINIHS 90* 87*      Chemistry Recent  Labs  Lab 02/27/21 0213 02/28/21 0347 03/01/21 0208  NA 142 143 144  K 3.9 3.6 4.0  CL 108 106 107  CO2 27 30 31   GLUCOSE 114* 116* 154*  BUN 24* 24* 23  CREATININE 1.55* 1.53* 1.55*  CALCIUM 8.4* 8.6* 8.6*  PROT 5.9*  --   --   ALBUMIN 2.6*  --   --   AST 40  --   --   ALT 26  --   --   ALKPHOS 83  --   --   BILITOT 1.4*  --   --   GFRNONAA 43* 44* 43*  ANIONGAP 7 7 6      Hematology Recent Labs  Lab 02/27/21 0213 02/28/21 0347 03/01/21 0208  WBC 5.9 7.6 8.2  RBC 4.21* 4.53 4.48  HGB 11.8* 12.7* 12.3*  HCT 37.4* 39.0 39.3  MCV 88.8 86.1 87.7  MCH 28.0 28.0 27.5  MCHC 31.6 32.6 31.3  RDW 19.1* 18.9* 18.7*  PLT 180 206 194    BNP Recent Labs  Lab 02/12/2021 1812  BNP 1,845.5*     DDimer No results for input(s): DDIMER in the last 168 hours.  Radiology    DG CHEST PORT 1 VIEW  Result Date: 02/28/2021 CLINICAL DATA:  Shortness of breath EXAM: PORTABLE CHEST 1 VIEW COMPARISON:  February 26, 2021; August 23, 2020 FINDINGS: There are partially loculated pleural effusions bilaterally. Fluid tracks along the right minor fissure. There is diffuse interstitial prominence which is consistent with interstitial pulmonary edema, potentially with underlying fibrosis. There is airspace consolidation in the left lower lobe and medial right base. There is cardiomegaly with pulmonary venous hypertension. Patient is status post coronary artery bypass grafting. No adenopathy. No bone lesions. IMPRESSION: Cardiomegaly with pulmonary vascular congestion. Interstitial edema with pleural effusions bilaterally, partially loculated. Overall appearance is felt to be indicative of a degree of congestive heart failure. Patient is status post coronary artery bypass grafting. Airspace opacity in the left lower lobe and medial right base likely represents superimposed atelectasis and pneumonia, although alveolar edema in these areas is possible. Alveolar edema and pneumonia may present  concurrently. Electronically Signed   By: Lowella Grip III M.D.   On: 02/28/2021 08:04    Cardiac Studies   TEE/DCCV 08/2020:  1. Left ventricular ejection fraction, by estimation, is 60 to 65%. The  left ventricle has normal function. There is moderate left ventricular  hypertrophy.  2. Right ventricular systolic function is moderately reduced. The right  ventricular size is mildly enlarged.  3. No left atrial/left atrial appendage thrombus was detected.  4. Right atrial size was mild to moderately dilated.  5. The mitral valve is grossly normal. Trivial mitral valve  regurgitation.  6. The aortic valve is tricuspid. Aortic valve regurgitation is not  visualized.  7. There is mild (Grade II) plaque.   Conclusion(s)/Recommendation(s): No LA/LAA thrombus identified. Successful  cardioversion performed with restoration of normal sinus rhythm.   Echocardiogram 07/20/2020:  1. Left ventricular ejection fraction, by estimation, is 60 to 65%. The  left ventricle has normal function. The left ventricle has no regional  wall motion abnormalities. There is moderate concentric left ventricular  hypertrophy. Left ventricular  diastolic parameters are consistent with Grade II diastolic dysfunction  (pseudonormalization). Elevated left atrial pressure. There is the  interventricular septum is flattened in systole and diastole, consistent  with right ventricular pressure and volume  overload.  2. Right ventricular systolic function is moderately reduced. The right  ventricular size is moderately enlarged. There is severely elevated  pulmonary artery systolic pressure. The estimated right ventricular  systolic pressure is 16.1 mmHg.  3. Right atrial size was mildly dilated.  4. The mitral valve is grossly normal. Mild mitral valve regurgitation.  No evidence of mitral stenosis.  5. Tricuspid valve regurgitation is mild to moderate.  6. The aortic valve is tricuspid.  Aortic valve regurgitation is not  visualized. Mild aortic valve sclerosis is present, with no evidence of  aortic valve stenosis.  7. The inferior vena cava is dilated in size with >50% respiratory  variability, suggesting right atrial pressure of 8 mmHg.   Comparison(s): Changes from prior study are noted. RVSP improved from  prior study.   Myoview stress test 01/05/2017:  The left ventricular ejection fraction is moderately decreased (30-44%).  Nuclear stress EF: 44%.  There was no ST segment deviation noted during stress.  This is a low risk study.  Low risk stress nuclear study with normal perfusion and normal left ventricular regional and global systolic function. Findings suggest nonischemic cardiomyopathy, but cannot exclude "balanced ischemia" in this patient with history of CAD and CABG.  Patient Profile  87 y.o. male with a hx of chronic diastolic CHF, mixed pulmonary venous and arterial hypertension followed by Dr. Lake Bells, CAD s/p CABG 1996, CKD stage II with a hx of partial nephrectomy 2018, HTN, HLD, RBBB,OSA with CPAPand chronic LE wounds followed by [facility] wound care RNwho is being followed for the evaluation ofCHFat the request of Dr. Hal Hope.  Assessment & Plan    1.Acute on chronic diastolic CHF: -Presented witha monthhx of worsening SOB/LE edemafound to be markedly fluid volume overloaded on presentationwith aBNP 1845andCXRconsistent with CHF -Last echocardiogram with LVEF at 60-65% with severe pulmonary mixed venous and arterial hypertension -HsT 90 -> 87, suspect demand ischemia -Creatinine stable, continue IV Lasix 80mg  BID -May need OP diuretic adjustment to torsemide -Weight 86.3 kg today, net negative 4.5 L but minimal input charted  2. Hypoxic respiratory failure: -Pt presented to [facility] with worsening SOB and LE/abdomial edema found to be profoundly hypoxic on presentation requiring Bipap ventilation.  -Management per IM   3.  Paroxsymal atrial fibrillation: -Appears to be intermittently going in and out of AF with rate control -Previously underwent TEE/DCCV to NSR and has been maintained on reduced dose Eliquis given age and renal function. He is not on rate controlling medications due to hx of AF with slow ventricular response -Hb stable -CHA2DS2/VAS Stroke Risk Points= 6, continue reduced dose apixaban  3. Mixed pulmonary venous and arterial hypertension: -Stable on last echocardiogram  -Followed by Dr. Lake Bells  4. CAD s/p CABG 1996: -No complaints of anginal symptoms -Continue current regimen with statin -No beta blocker with hx of bradycardia -No ASA in the setting of Eliquis  5. CKD stage III: -Creatinine above baseline on presentation at 2.18, improved with diuresis -Continue to monitor renal function closely  -Follow with daily BMET   6. HTN: -has been elevated this admission, numbers slowly improving, recent range 122/66-174/80 -home benazepril on hold given AKI on presentation -increased amlodipine to 10mg  QD, continue clonidine -started hydralazine, uptitrated -started imdur this admission  7. HLD: -Last LDL,52 on 07/21/2020 -Continue statin, Zetia  8. DM2: -HbA1c,6.4 -SSI for glucose control per IM  9. Hypothyroidism: -TSH, 12.388 this admission>>previously 3.5 on 07/21/2020 -Needs synthroid adjustment per IM  For questions or updates, please contact [place] Please consult [email or web address] for contact info under        Signed, Buford Dresser, MD  03/01/2021, 1:01 PM

## 2021-03-01 NOTE — Progress Notes (Signed)
Physical Therapy Treatment Patient Details Name: [name] MRN: [id number] DOB: 13-Nov-1932 Today's Date: 03/01/2021    History of Present Illness Pt is an 87 y.o. male admitted 02/23/2021 with worsening SOB, peripheral edema; recently admitted 12/2020 for COVID and CHF with progressively worse SOB since return home. Workup for acute respiratory failure secondary to decompensated HF; pt requiring BiPAP. PMH includes CHF, severe pulmonary HTN, afib, HTN, CKD, COPD, DM2, anemia.    PT Comments    Pt remains limited by supplemental oxygen needs at this time, remaining on HFNC and desaturating with bed-level exercise. PT encourages pursed lip breathing and provides education on proper positioning in bed to improve tidal volume and pulmonary capacity. PT provides instruction for HEP to be performed as tolerated over the remainder of the weekend. Pt will continue to benefit from acute PT POC to progress mobility as tolerated. PT continues to recommend HHPT pending progression of activity tolerance.   Follow Up Recommendations  Home health PT;Supervision for mobility/OOB (pending progression)     Equipment Recommendations   (TBD)    Recommendations for Other Services       Precautions / Restrictions Precautions Precautions: Fall;Other (comment) Precaution Comments: Watch SpO2 on high flow nasal canula Restrictions Weight Bearing Restrictions: No    Mobility  Bed Mobility Overal bed mobility: Needs Assistance             General bed mobility comments: pt assisted scooting up in bed, modA. Deferred further functional mobility due to desat with bed-level exercise    Transfers                    Ambulation/Gait                 Stairs             Wheelchair Mobility    Modified Rankin (Stroke Patients Only)       Balance Overall balance assessment:  (not assessed)                                          Cognition  Arousal/Alertness: Awake/alert Behavior During Therapy: WFL for tasks assessed/performed;Flat affect Overall Cognitive Status: Within Functional Limits for tasks assessed                                        Exercises General Exercises - Lower Extremity Ankle Circles/Pumps: AROM;Both;20 reps Gluteal Sets: AROM;Both;10 reps Heel Slides: AROM;Both;5 reps Hip ABduction/ADduction: AROM;Both;10 reps Straight Leg Raises: AROM;Both;10 reps    General Comments General comments (skin integrity, edema, etc.): pt on 13L HFNC during session, sats at 90% initially at rest. Pt does desat with exercise to 85% and briefly to 79% when scooting up in bed. Pt takes increased time to recover and PT encourages pursed lip breathing technique.      Pertinent Vitals/Pain Pain Assessment: No/denies pain    Home Living                      Prior Function            PT Goals (current goals can now be found in the care plan section) Acute Rehab PT Goals Patient Stated Goal: agreeable to exercises Progress towards PT goals: Not progressing toward goals - comment (  limited by pulmonary status)    Frequency    Min 3X/week      PT Plan Current plan remains appropriate    Co-evaluation              AM-PAC PT "6 Clicks" Mobility   Outcome Measure  Help needed turning from your back to your side while in a flat bed without using bedrails?: None Help needed moving from lying on your back to sitting on the side of a flat bed without using bedrails?: A Little Help needed moving to and from a bed to a chair (including a wheelchair)?: A Little Help needed standing up from a chair using your arms (e.g., wheelchair or bedside chair)?: A Little Help needed to walk in hospital room?: A Little Help needed climbing 3-5 steps with a railing? : A Lot 6 Click Score: 18    End of Session Equipment Utilized During Treatment: Oxygen Activity Tolerance: Other (comment) (limited by  pulmonary function) Patient left: in bed;with call bell/phone within reach;with bed alarm set;with family/visitor present Nurse Communication: Mobility status PT Visit Diagnosis: Other abnormalities of gait and mobility (R26.89);Muscle weakness (generalized) (M62.81)     Time: 1840-3754 PT Time Calculation (min) (ACUTE ONLY): 17 min  Charges:  $Therapeutic Exercise: 8-22 mins                     Zenaida Niece, PT, DPT Acute Rehabilitation Pager: [phone]    Zenaida Niece 03/01/2021, 3:31 PM

## 2021-03-01 NOTE — Progress Notes (Signed)
[facility] Progress Note  Patient: [name]    EXH:[id number]  DOA: 02/24/2021     Date of Service: the patient was seen and examined on 03/01/2021  Brief hospital course: Past medical history of chronic CHF, chronic A. fib, HTN, CKD 3B, COPD.  Presents with complaints of worsening shortness of breath found to have acute on chronic diastolic CHF with acute hypoxic respiratory failure. Currently plan is continue diuresis.  Assessment and Plan: 1.  Acute on chronic diastolic CHF Severe pulmonary hypertension mixed venous and arterial Acute on chronic hypoxic respiratory failure Currently needing BiPAP. Saturation dropped to 80s off of BiPAP. On baseline 3 LPM oxygen.  Currently requiring 15 L minimum to maintain adequate saturation but then develops distress therefore back on BiPAP. Suspect that patient has poor lung reserve given his recent Covid pneumonia. Continue IV diuresis.  Will add IV albumin. Continue as needed BiPAP.  2.  Paroxysmal A. fib Currently rate controlled. Continue Eliquis.  3.  CAD S/P CABG Elevated troponin Elevated troponin likely demand ischemia. No evidence of acute ischemia right now. Management per cardiology.  4.  Accelerated hypertension Potentially can contribute to patient's current presentation. Blood pressure significantly elevated. IV hydralazine and as needed nitroglycerin while the patient is NPO. Continuing home regimen which includes clonidine, Norvasc.  Holding benazepril given renal function.  5.  HLD Continue Zetia and statin.  6.  Type 2 diabetes mellitus, uncontrolled with hypoglycemia in setting of poor p.o. intake along with chronic kidney disease and without long-term insulin use. Hemoglobin A1c 6.4. Continue sliding scale insulin.  7.  Hypothyroidism. TSH significantly elevated. We will recheck free T4. Depending on free T4 we will adjust Synthroid dose.  8.  Mixed pulmonary and venous arterial  hypertension. Patient follows up with cardiology outpatient. Currently stable.  Monitor.  9.  COPD. OSA No evidence of exacerbation. Saturation above 88% is adequate. On CPAP at home. Currently on BiPAP as needed.  10.  Anemia of chronic kidney disease. Single function stable. Monitor.  11.  CKD 3B.  Intermittently worsening to CKD stage IV. History of renal mass. History of nephrectomy. Renal function currently stable.  Monitor.  Diet: Mechanical soft diet. DVT Prophylaxis:   apixaban (ELIQUIS) tablet 2.5 mg Start:  2330 apixaban (ELIQUIS) tablet 2.5 mg    Advance goals of care discussion: Full code  Family Communication: no family was present at bedside, at the time of interview.   Disposition:  Status is: Inpatient  Remains inpatient appropriate because:IV treatments appropriate due to intensity of illness or inability to take PO  Dispo: The patient is from: Home              Anticipated d/c is to: SNF              Patient currently is not medically stable to d/c.   Difficult to place patient   Subjective: No nausea no vomiting.  No fever no chills.  No chest pain.  No shortness of breath.  Although appears visibly short of breath.  No cough.  No diarrhea.  No constipation.  Physical Exam:  General: Appear in mild distress, no Rash; Oral Mucosa Clear, moist. no Abnormal Neck Mass Or lumps, Conjunctiva normal  Cardiovascular: S1 and S2 Present, no Murmur, Respiratory: increased respiratory effort, Bilateral Air entry present and bilateral  Crackles, no wheezes Abdomen: Bowel Sound present, Soft and no tenderness Extremities: trace Pedal edema Neurology: alert and oriented to time, place, and person affect appropriate. no  new focal deficit Gait not checked due to patient safety concerns    Vitals:   03/01/21 0355 03/01/21 0700 03/01/21 1200 03/01/21 1552  BP:  (!) 160/77 135/80 133/68  Pulse:   82 90  Resp:  20 19 19   Temp:  (!) 97.5 F (36.4 C) 97.8  F (36.6 C) 98 F (36.7 C)  TempSrc:  Oral Oral Oral  SpO2:  92% 100% 93%  Weight: 86.3 kg       Intake/Output Summary (Last 24 hours) at 03/01/2021 1958 Last data filed at 03/01/2021 1810 Gross per 24 hour  Intake 1009 ml  Output 1225 ml  Net -216 ml   Filed Weights   02/28/21 0300 03/01/21 0355  Weight: 87.8 kg 86.3 kg    Data Reviewed: I have personally reviewed and interpreted daily labs, tele strips, imaging. I reviewed all nursing notes, pharmacy notes, vitals, pertinent old records I have discussed plan of care as described above with RN and patient/family.  CBC: Recent Labs  Lab 03/03/2021 1812 02/21/2021 2110 02/27/21 0213 02/28/21 0347 03/01/21 0208  WBC 8.6  --  5.9 7.6 8.2  NEUTROABS 6.0  --   --   --   --   HGB 13.3 12.6* 11.8* 12.7* 12.3*  HCT 43.2 37.0* 37.4* 39.0 39.3  MCV 90.2  --  88.8 86.1 87.7  PLT 231  --  180 206 179   Basic Metabolic Panel: Recent Labs  Lab 02/17/2021 1812 02/10/2021 2110 02/27/21 0213 02/28/21 0347 03/01/21 0208  NA 141 143 142 143 144  K 4.0 3.9 3.9 3.6 4.0  CL 104  --  108 106 107  CO2 29  --  27 30 31   GLUCOSE 107*  --  114* 116* 154*  BUN 24*  --  24* 24* 23  CREATININE 1.61*  --  1.55* 1.53* 1.55*  CALCIUM 8.8*  --  8.4* 8.6* 8.6*  MG  --   --  2.1 2.2 2.3    Studies: No results found.  Scheduled Meds: . amLODipine  10 mg Oral Daily  . apixaban  2.5 mg Oral BID  . cloNIDine  0.1 mg Oral BID  . ezetimibe  10 mg Oral Daily  . furosemide  80 mg Intravenous Q8H  . hydrALAZINE  50 mg Oral Q8H  . insulin aspart  0-9 Units Subcutaneous TID WC  . isosorbide mononitrate  30 mg Oral Daily  . levothyroxine  112 mcg Oral QAC breakfast  . rosuvastatin  20 mg Oral Daily   Continuous Infusions: PRN Meds: acetaminophen **OR** acetaminophen, dextrose, hydrALAZINE, polyethylene glycol, polyvinyl alcohol  Time spent: 35 minutes  Author: Berle Mull, MD [facility] 03/01/2021 7:58 PM  To reach On-call, see care  teams to locate the attending and reach out via [email or web address]. Between 7PM-7AM, please contact night-coverage If you still have difficulty reaching the attending provider, please page the [facility] (Director on Call) for [facility] on amion for assistance.

## 2021-03-02 ENCOUNTER — Inpatient Hospital Stay (HOSPITAL_COMMUNITY): Payer: Medicare Other

## 2021-03-02 ENCOUNTER — Encounter (HOSPITAL_COMMUNITY): Payer: Self-pay | Admitting: Internal Medicine

## 2021-03-02 ENCOUNTER — Inpatient Hospital Stay (HOSPITAL_COMMUNITY): Payer: Medicare Other | Admitting: Certified Registered"

## 2021-03-02 DIAGNOSIS — I5033 Acute on chronic diastolic (congestive) heart failure: Secondary | ICD-10-CM | POA: Diagnosis not present

## 2021-03-02 DIAGNOSIS — I469 Cardiac arrest, cause unspecified: Secondary | ICD-10-CM | POA: Diagnosis not present

## 2021-03-02 DIAGNOSIS — I509 Heart failure, unspecified: Secondary | ICD-10-CM | POA: Diagnosis not present

## 2021-03-02 DIAGNOSIS — I4891 Unspecified atrial fibrillation: Secondary | ICD-10-CM | POA: Diagnosis not present

## 2021-03-02 DIAGNOSIS — J9601 Acute respiratory failure with hypoxia: Secondary | ICD-10-CM

## 2021-03-02 DIAGNOSIS — I272 Pulmonary hypertension, unspecified: Secondary | ICD-10-CM

## 2021-03-02 DIAGNOSIS — E1159 Type 2 diabetes mellitus with other circulatory complications: Secondary | ICD-10-CM | POA: Diagnosis not present

## 2021-03-02 HISTORY — PX: EEG (ROUTINE): EEG11854

## 2021-03-02 LAB — CBC WITH DIFFERENTIAL/PLATELET
Abs Immature Granulocytes: 0.23 10*3/uL — ABNORMAL HIGH (ref 0.00–0.07)
Basophils Absolute: 0 10*3/uL (ref 0.0–0.1)
Basophils Relative: 0 %
Eosinophils Absolute: 0.2 10*3/uL (ref 0.0–0.5)
Eosinophils Relative: 2 %
HCT: 39.5 % (ref 39.0–52.0)
Hemoglobin: 11.8 g/dL — ABNORMAL LOW (ref 13.0–17.0)
Immature Granulocytes: 2 %
Lymphocytes Relative: 29 %
Lymphs Abs: 3.3 10*3/uL (ref 0.7–4.0)
MCH: 28.2 pg (ref 26.0–34.0)
MCHC: 29.9 g/dL — ABNORMAL LOW (ref 30.0–36.0)
MCV: 94.3 fL (ref 80.0–100.0)
Monocytes Absolute: 0.6 10*3/uL (ref 0.1–1.0)
Monocytes Relative: 6 %
Neutro Abs: 7 10*3/uL (ref 1.7–7.7)
Neutrophils Relative %: 61 %
Platelets: 208 10*3/uL (ref 150–400)
RBC: 4.19 MIL/uL — ABNORMAL LOW (ref 4.22–5.81)
RDW: 18.8 % — ABNORMAL HIGH (ref 11.5–15.5)
WBC: 11.5 10*3/uL — ABNORMAL HIGH (ref 4.0–10.5)
nRBC: 0.3 % — ABNORMAL HIGH (ref 0.0–0.2)

## 2021-03-02 LAB — CBC
HCT: 36.3 % — ABNORMAL LOW (ref 39.0–52.0)
Hemoglobin: 11.6 g/dL — ABNORMAL LOW (ref 13.0–17.0)
MCH: 28.2 pg (ref 26.0–34.0)
MCHC: 32 g/dL (ref 30.0–36.0)
MCV: 88.1 fL (ref 80.0–100.0)
Platelets: 187 10*3/uL (ref 150–400)
RBC: 4.12 MIL/uL — ABNORMAL LOW (ref 4.22–5.81)
RDW: 18.7 % — ABNORMAL HIGH (ref 11.5–15.5)
WBC: 9.3 10*3/uL (ref 4.0–10.5)
nRBC: 0 % (ref 0.0–0.2)

## 2021-03-02 LAB — RETICULOCYTES
Immature Retic Fract: 12.3 % (ref 2.3–15.9)
RBC.: 4.18 MIL/uL — ABNORMAL LOW (ref 4.22–5.81)
Retic Count, Absolute: 61.4 10*3/uL (ref 19.0–186.0)
Retic Ct Pct: 1.5 % (ref 0.4–3.1)

## 2021-03-02 LAB — GLUCOSE, CAPILLARY
Glucose-Capillary: 112 mg/dL — ABNORMAL HIGH (ref 70–99)
Glucose-Capillary: 157 mg/dL — ABNORMAL HIGH (ref 70–99)
Glucose-Capillary: 168 mg/dL — ABNORMAL HIGH (ref 70–99)
Glucose-Capillary: 177 mg/dL — ABNORMAL HIGH (ref 70–99)
Glucose-Capillary: 194 mg/dL — ABNORMAL HIGH (ref 70–99)

## 2021-03-02 LAB — COMPREHENSIVE METABOLIC PANEL
ALT: 62 U/L — ABNORMAL HIGH (ref 0–44)
ALT: 58 U/L — ABNORMAL HIGH (ref 0–44)
AST: 127 U/L — ABNORMAL HIGH (ref 15–41)
AST: 120 U/L — ABNORMAL HIGH (ref 15–41)
Albumin: 2.2 g/dL — ABNORMAL LOW (ref 3.5–5.0)
Albumin: 2.4 g/dL — ABNORMAL LOW (ref 3.5–5.0)
Alkaline Phosphatase: 92 U/L (ref 38–126)
Alkaline Phosphatase: 98 U/L (ref 38–126)
Anion gap: 12 (ref 5–15)
Anion gap: 6 (ref 5–15)
BUN: 24 mg/dL — ABNORMAL HIGH (ref 8–23)
BUN: 31 mg/dL — ABNORMAL HIGH (ref 8–23)
CO2: 26 mmol/L (ref 22–32)
CO2: 28 mmol/L (ref 22–32)
Calcium: 8 mg/dL — ABNORMAL LOW (ref 8.9–10.3)
Calcium: 8.1 mg/dL — ABNORMAL LOW (ref 8.9–10.3)
Chloride: 108 mmol/L (ref 98–111)
Chloride: 107 mmol/L (ref 98–111)
Creatinine, Ser: 1.93 mg/dL — ABNORMAL HIGH (ref 0.61–1.24)
Creatinine, Ser: 2.1 mg/dL — ABNORMAL HIGH (ref 0.61–1.24)
GFR, Estimated: 33 mL/min — ABNORMAL LOW (ref >60)
GFR, Estimated: 30 mL/min — ABNORMAL LOW (ref >60)
Glucose, Bld: 243 mg/dL — ABNORMAL HIGH (ref 70–99)
Glucose, Bld: 197 mg/dL — ABNORMAL HIGH (ref 70–99)
Potassium: 3.3 mmol/L — ABNORMAL LOW (ref 3.5–5.1)
Potassium: 4.4 mmol/L (ref 3.5–5.1)
Sodium: 146 mmol/L — ABNORMAL HIGH (ref 135–145)
Sodium: 141 mmol/L (ref 135–145)
Total Bilirubin: 0.9 mg/dL (ref 0.3–1.2)
Total Bilirubin: 0.9 mg/dL (ref 0.3–1.2)
Total Protein: 5.3 g/dL — ABNORMAL LOW (ref 6.5–8.1)
Total Protein: 5.4 g/dL — ABNORMAL LOW (ref 6.5–8.1)

## 2021-03-02 LAB — MRSA PCR SCREENING: MRSA by PCR: NEGATIVE

## 2021-03-02 LAB — PHOSPHORUS: Phosphorus: 4.4 mg/dL (ref 2.5–4.6)

## 2021-03-02 LAB — COOXEMETRY PANEL
Carboxyhemoglobin: 1.6 % — ABNORMAL HIGH (ref 0.5–1.5)
Methemoglobin: 0.8 % (ref 0.0–1.5)
O2 Saturation: 62.2 %
Total hemoglobin: 11.1 g/dL — ABNORMAL LOW (ref 12.0–16.0)

## 2021-03-02 LAB — BASIC METABOLIC PANEL
Anion gap: 6 (ref 5–15)
BUN: 23 mg/dL (ref 8–23)
CO2: 31 mmol/L (ref 22–32)
Calcium: 8.5 mg/dL — ABNORMAL LOW (ref 8.9–10.3)
Chloride: 107 mmol/L (ref 98–111)
Creatinine, Ser: 1.5 mg/dL — ABNORMAL HIGH (ref 0.61–1.24)
GFR, Estimated: 45 mL/min — ABNORMAL LOW (ref >60)
Glucose, Bld: 118 mg/dL — ABNORMAL HIGH (ref 70–99)
Potassium: 3.9 mmol/L (ref 3.5–5.1)
Sodium: 144 mmol/L (ref 135–145)

## 2021-03-02 LAB — POCT I-STAT 7, (LYTES, BLD GAS, ICA,H+H)
Acid-Base Excess: 2 mmol/L (ref 0.0–2.0)
Bicarbonate: 28.8 mmol/L — ABNORMAL HIGH (ref 20.0–28.0)
Calcium, Ion: 1.15 mmol/L (ref 1.15–1.40)
HCT: 33 % — ABNORMAL LOW (ref 39.0–52.0)
Hemoglobin: 11.2 g/dL — ABNORMAL LOW (ref 13.0–17.0)
O2 Saturation: 93 %
Potassium: 4.1 mmol/L (ref 3.5–5.1)
Sodium: 145 mmol/L (ref 135–145)
TCO2: 30 mmol/L (ref 22–32)
pCO2 arterial: 51.3 mmHg — ABNORMAL HIGH (ref 32.0–48.0)
pH, Arterial: 7.357 (ref 7.350–7.450)
pO2, Arterial: 71 mmHg — ABNORMAL LOW (ref 83.0–108.0)

## 2021-03-02 LAB — TYPE AND SCREEN
ABO/RH(D): O POS
Antibody Screen: NEGATIVE

## 2021-03-02 LAB — MAGNESIUM
Magnesium: 2.1 mg/dL (ref 1.7–2.4)
Magnesium: 2.6 mg/dL — ABNORMAL HIGH (ref 1.7–2.4)
Magnesium: 2.1 mg/dL (ref 1.7–2.4)

## 2021-03-02 LAB — LACTIC ACID, PLASMA
Lactic Acid, Venous: 9.4 mmol/L (ref 0.5–1.9)
Lactic Acid, Venous: 2.7 mmol/L (ref 0.5–1.9)

## 2021-03-02 LAB — HEMOGLOBIN A1C
Hgb A1c MFr Bld: 5.5 % (ref 4.8–5.6)
Mean Plasma Glucose: 111.15 mg/dL

## 2021-03-02 LAB — PROTIME-INR
INR: 1.7 — ABNORMAL HIGH (ref 0.8–1.2)
Prothrombin Time: 19.4 seconds — ABNORMAL HIGH (ref 11.4–15.2)

## 2021-03-02 LAB — TROPONIN I (HIGH SENSITIVITY)
Troponin I (High Sensitivity): 157 ng/L (ref <18)
Troponin I (High Sensitivity): 168 ng/L (ref <18)

## 2021-03-02 LAB — D-DIMER, QUANTITATIVE: D-Dimer, Quant: 3.62 ug/mL-FEU — ABNORMAL HIGH (ref 0.00–0.50)

## 2021-03-02 MED ORDER — INSULIN ASPART 100 UNIT/ML ~~LOC~~ SOLN
0.0000 [IU] | SUBCUTANEOUS | Status: DC
  Administered 2021-03-02 (×2): 2 [IU] via SUBCUTANEOUS
  Administered 2021-03-03 (×3): 1 [IU] via SUBCUTANEOUS
  Administered 2021-03-03 (×2): 2 [IU] via SUBCUTANEOUS
  Administered 2021-03-03: 3 [IU] via SUBCUTANEOUS
  Administered 2021-03-04 (×3): 2 [IU] via SUBCUTANEOUS
  Administered 2021-03-04: 1 [IU] via SUBCUTANEOUS

## 2021-03-02 MED ORDER — VASOPRESSIN 20 UNITS/100 ML INFUSION FOR SHOCK
0.0000 [IU]/min | INTRAVENOUS | Status: DC
  Administered 2021-03-02 – 2021-03-04 (×5): 0.03 [IU]/min via INTRAVENOUS
  Filled 2021-03-02 (×5): qty 100

## 2021-03-02 MED ORDER — BUDESONIDE 0.5 MG/2ML IN SUSP
0.5000 mg | Freq: Two times a day (BID) | RESPIRATORY_TRACT | Status: DC
  Administered 2021-03-02 – 2021-03-04 (×4): 0.5 mg via RESPIRATORY_TRACT
  Filled 2021-03-02 (×4): qty 2

## 2021-03-02 MED ORDER — "THROMBI-PAD 3""X3"" EX PADS"
1.0000 | MEDICATED_PAD | Freq: Once | CUTANEOUS | Status: DC
  Filled 2021-03-02: qty 1

## 2021-03-02 MED ORDER — VITAL HIGH PROTEIN PO LIQD
1000.0000 mL | ORAL | Status: DC
  Administered 2021-03-02: 1000 mL

## 2021-03-02 MED ORDER — DOCUSATE SODIUM 50 MG/5ML PO LIQD: 100.0000 mg | Freq: Two times a day (BID) | ORAL | Status: DC

## 2021-03-02 MED ORDER — PROPOFOL 1000 MG/100ML IV EMUL
0.0000 ug/kg/min | INTRAVENOUS | Status: DC
  Administered 2021-03-02: 10 ug/kg/min via INTRAVENOUS
  Administered 2021-03-02: 30 ug/kg/min via INTRAVENOUS
  Administered 2021-03-03: 25 ug/kg/min via INTRAVENOUS
  Administered 2021-03-03: 30 ug/kg/min via INTRAVENOUS
  Filled 2021-03-02 (×2): qty 100

## 2021-03-02 MED ORDER — FENTANYL CITRATE (PF) 100 MCG/2ML IJ SOLN: 25.0000 ug | INTRAMUSCULAR | Status: DC | PRN

## 2021-03-02 MED ORDER — FAMOTIDINE IN NACL 20-0.9 MG/50ML-% IV SOLN
20.0000 mg | Freq: Every day | INTRAVENOUS | Status: DC
  Administered 2021-03-02 – 2021-03-03 (×2): 20 mg via INTRAVENOUS
  Filled 2021-03-02 (×2): qty 50

## 2021-03-02 MED ORDER — ACETAMINOPHEN 325 MG PO TABS
650.0000 mg | ORAL_TABLET | Freq: Four times a day (QID) | ORAL | Status: DC | PRN
  Administered 2021-03-04: 650 mg
  Filled 2021-03-02: qty 2

## 2021-03-02 MED ORDER — HEPARIN (PORCINE) 25000 UT/250ML-% IV SOLN
900.0000 [IU]/h | INTRAVENOUS | Status: DC
  Administered 2021-03-02: 1200 [IU]/h via INTRAVENOUS
  Administered 2021-03-03: 1050 [IU]/h via INTRAVENOUS
  Filled 2021-03-02 (×2): qty 250

## 2021-03-02 MED ORDER — NOREPINEPHRINE 4 MG/250ML-% IV SOLN
2.0000 ug/min | INTRAVENOUS | Status: DC
  Administered 2021-03-02: 8 ug/min via INTRAVENOUS
  Filled 2021-03-02: qty 250

## 2021-03-02 MED ORDER — CEFTRIAXONE SODIUM 1 G IJ SOLR
2.0000 g | INTRAMUSCULAR | Status: DC
  Filled 2021-03-02: qty 20

## 2021-03-02 MED ORDER — POTASSIUM CHLORIDE 20 MEQ PO PACK
40.0000 meq | PACK | Freq: Once | ORAL | Status: AC
  Administered 2021-03-02: 40 meq
  Filled 2021-03-02: qty 2

## 2021-03-02 MED ORDER — LEVOTHYROXINE SODIUM 112 MCG PO TABS
112.0000 ug | ORAL_TABLET | Freq: Every day | ORAL | Status: DC
  Administered 2021-03-03 – 2021-03-04 (×2): 112 ug
  Filled 2021-03-02 (×3): qty 1

## 2021-03-02 MED ORDER — SODIUM CHLORIDE 0.9 % IV SOLN
2.0000 g | INTRAVENOUS | Status: DC
  Administered 2021-03-02 – 2021-03-04 (×3): 2 g via INTRAVENOUS
  Filled 2021-03-02 (×2): qty 20
  Filled 2021-03-02: qty 2

## 2021-03-02 MED ORDER — FENTANYL CITRATE (PF) 100 MCG/2ML IJ SOLN: 50.0000 ug | INTRAMUSCULAR | Status: DC | PRN

## 2021-03-02 MED ORDER — SODIUM CHLORIDE 0.9 % IV SOLN
250.0000 mL | INTRAVENOUS | Status: DC
  Administered 2021-03-02: 250 mL via INTRAVENOUS

## 2021-03-02 MED ORDER — ARFORMOTEROL TARTRATE 15 MCG/2ML IN NEBU
15.0000 ug | INHALATION_SOLUTION | Freq: Two times a day (BID) | RESPIRATORY_TRACT | Status: DC
  Administered 2021-03-02 – 2021-03-04 (×4): 15 ug via RESPIRATORY_TRACT
  Filled 2021-03-02 (×4): qty 2

## 2021-03-02 MED ORDER — PROPOFOL 1000 MG/100ML IV EMUL
0.0000 ug/kg/min | INTRAVENOUS | Status: DC
  Administered 2021-03-02: 5 ug/kg/min via INTRAVENOUS
  Filled 2021-03-02: qty 100

## 2021-03-02 MED ORDER — DOCUSATE SODIUM 50 MG/5ML PO LIQD
100.0000 mg | Freq: Two times a day (BID) | ORAL | Status: DC
  Administered 2021-03-02 – 2021-03-04 (×4): 100 mg
  Filled 2021-03-02 (×4): qty 10

## 2021-03-02 MED ORDER — CHLORHEXIDINE GLUCONATE 0.12% ORAL RINSE (MEDLINE KIT)
15.0000 mL | Freq: Two times a day (BID) | OROMUCOSAL | Status: DC
  Administered 2021-03-02 – 2021-03-04 (×5): 15 mL via OROMUCOSAL

## 2021-03-02 MED ORDER — POLYETHYLENE GLYCOL 3350 17 G PO PACK
17.0000 g | PACK | Freq: Every day | ORAL | Status: DC
  Administered 2021-03-03 – 2021-03-04 (×2): 17 g
  Filled 2021-03-02 (×2): qty 1

## 2021-03-02 MED ORDER — FUROSEMIDE 10 MG/ML IJ SOLN
40.0000 mg | Freq: Once | INTRAMUSCULAR | Status: AC
  Administered 2021-03-02: 40 mg via INTRAVENOUS
  Filled 2021-03-02: qty 4

## 2021-03-02 MED ORDER — FENTANYL CITRATE (PF) 100 MCG/2ML IJ SOLN
25.0000 ug | INTRAMUSCULAR | Status: DC | PRN
  Filled 2021-03-02: qty 2

## 2021-03-02 MED ORDER — ORAL CARE MOUTH RINSE
15.0000 mL | OROMUCOSAL | Status: DC
  Administered 2021-03-02 – 2021-03-04 (×16): 15 mL via OROMUCOSAL

## 2021-03-02 MED ORDER — SODIUM CHLORIDE 0.9 % IV SOLN: 250.0000 mL | INTRAVENOUS | Status: DC

## 2021-03-02 MED ORDER — FENTANYL CITRATE (PF) 100 MCG/2ML IJ SOLN
25.0000 ug | INTRAMUSCULAR | Status: DC | PRN
  Administered 2021-03-02: 25 ug via INTRAVENOUS

## 2021-03-02 MED ORDER — CHLORHEXIDINE GLUCONATE CLOTH 2 % EX PADS
6.0000 | MEDICATED_PAD | Freq: Every day | CUTANEOUS | Status: DC
  Administered 2021-03-02 – 2021-03-04 (×2): 6 via TOPICAL

## 2021-03-02 MED ORDER — PROSOURCE TF PO LIQD
45.0000 mL | Freq: Two times a day (BID) | ORAL | Status: DC
  Administered 2021-03-02 – 2021-03-03 (×3): 45 mL
  Filled 2021-03-02 (×3): qty 45

## 2021-03-02 MED ORDER — INSULIN ASPART 100 UNIT/ML ~~LOC~~ SOLN: 1.0000 [IU] | SUBCUTANEOUS | Status: DC

## 2021-03-02 MED ORDER — ROSUVASTATIN CALCIUM 20 MG PO TABS
20.0000 mg | ORAL_TABLET | Freq: Every day | ORAL | Status: DC
  Administered 2021-03-03 – 2021-03-04 (×2): 20 mg
  Filled 2021-03-02 (×2): qty 1

## 2021-03-02 MED ORDER — EZETIMIBE 10 MG PO TABS
10.0000 mg | ORAL_TABLET | Freq: Every day | ORAL | Status: DC
  Administered 2021-03-03 – 2021-03-04 (×2): 10 mg
  Filled 2021-03-02 (×2): qty 1

## 2021-03-02 MED ORDER — IPRATROPIUM BROMIDE 0.02 % IN SOLN: 0.5000 mg | RESPIRATORY_TRACT | Status: DC | PRN

## 2021-03-02 MED ORDER — FAMOTIDINE IN NACL 20-0.9 MG/50ML-% IV SOLN: 20.0000 mg | Freq: Two times a day (BID) | INTRAVENOUS | Status: DC

## 2021-03-02 MED ORDER — ACETAMINOPHEN 650 MG RE SUPP: 650.0000 mg | Freq: Four times a day (QID) | RECTAL | Status: DC | PRN

## 2021-03-02 MED ORDER — NOREPINEPHRINE 4 MG/250ML-% IV SOLN
0.0000 ug/min | INTRAVENOUS | Status: DC
  Administered 2021-03-02: 19 ug/min via INTRAVENOUS
  Administered 2021-03-03: 6 ug/min via INTRAVENOUS
  Administered 2021-03-03: 12 ug/min via INTRAVENOUS
  Administered 2021-03-03: 8 ug/min via INTRAVENOUS
  Filled 2021-03-02 (×4): qty 250

## 2021-03-02 NOTE — Progress Notes (Signed)
RT NOTES: Patient on 15L [place], sats 86% and patient visibly short of breath. Placed patient on [place] 40L/100%. Sats now 93% and work of breathing is improving. Will continue to monitor patient.

## 2021-03-02 NOTE — Progress Notes (Signed)
EEG complete - results pending 

## 2021-03-02 NOTE — Code Documentation (Signed)
  Patient Name: [name]   MRN: [id number]   Date of Birth/ Sex: 17-Aug-1932 , male      Admission Date: 02/27/2021  Attending Provider: Lavina Hamman, MD  Primary Diagnosis: Acute on chronic heart failure [facility])   Indication: Pt was in his usual state of health until this AM, when he was noted to be unresponsive without pulse. Code blue was subsequently called. At the time of arrival on scene, ACLS protocol was underway.   Technical Description:  - CPR performance duration:  25 minutes  - Was defibrillation or cardioversion used? No   - Was external pacer placed? No  - Was patient intubated pre/post CPR? Yes   Medications Administered: Y = Yes; Blank = No Amiodarone    Atropine    Calcium    Epinephrine  Y  Lidocaine    Magnesium    Norepinephrine    Phenylephrine    Sodium bicarbonate  Y  Vasopressin     Post CPR evaluation:  - Final Status - Was patient successfully resuscitated ? Yes - What is current rhythm? Sinus  - What is current hemodynamic status? Stable on norepinephrine  Miscellaneous Information:  - Labs sent, including:  CBC, Mg, CMP, Lactic acid, INR, Reticulocytes, Troponin, Type and screen, D-dimer   - Primary team notified?  Yes  - Family Notified? Yes  - Additional notes/ transfer status: Patient transferred to [place] with admitting attending physician, Dr. Posey Pronto, at bedside who has signed out patient to PCCM.     Cato Mulligan, MD  03/02/2021, 12:08 PM

## 2021-03-02 NOTE — Progress Notes (Signed)
Sedation was turned off prior to EEG study being started and was turned back on when study was completed. Patient monitored by this RN face to face throughout.

## 2021-03-02 NOTE — Consult Note (Signed)
NAME:  [name], MRN:  [id number], DOB:  20-Mar-1932, LOS: 4 ADMISSION DATE:  02/25/2021, CONSULTATION DATE: 3/27 REFERRING MD: Dr. Posey Pronto, CHIEF COMPLAINT:  SOB  History of Present Illness:  87 year old male who presented to [facility] ER on 3/23 with reports of shortness of breath.    The patient was admitted per [facility] with concerns for acute on chronic diastolic CHF and acute hypoxemic respiratory failure.  He was noted to be significantly volume overloaded on presentation with a BNP of 1845 and chest x-ray consistent with CHF. At baseline he wears 3 L O2 but was noted to require 15 L on admission.  He was placed on BiPAP for respiratory distress.  Per documentation there were episodes of desaturation into the 80s on 3/26 when removed from BiPAP. He was treated with IV diuresis, albumin and BiPAP support.  Work-up notable for mildly elevated troponin thought related to demand ischemia.  Cardiology was consulted.  He also had significantly elevated TSH at 12.388.  Repeat free T4 pending.   On 3/27 the BiPAP was removed so he could eat lunch.  He was placed on 15L salter. The patient reportedly had an aspiration event and then suffered a cardiopulmonary arrest.  He underwent 25 minutes of ACLS before ROSC.  The patient was intubated by anesthesia and transferred to ICU for further evaluation.   PCCM consulted for evaluation.  Pertinent  Medical History  AF -on Eliquis CAD s/p CABG HTN HLD COVID PNA - January 2022  Mixed pulmonary and venous arterial hypertension Chronic Hypoxic Respiratory Failure  COPD OSA DM2 Hypothyroidism CKD 3B - baseline sr cr ~ 1.5 History of renal mass, nephrectomy Anemia of chronic kidney disease  Significant Hospital Events: Including procedures, antibiotic start and stop dates in addition to other pertinent events   . 3/23 admit per [facility] with SOB thought related to decompensated CHF, hypoxic resp failure . 3/27 cardiac arrest, transferred to ICU, PCCM  service  Interim History / Subjective:  As above. Pt post arrest in ICU on vent.   Afebrile  Central access placed per Dr. Tacy Learn  Objective   Blood pressure (!) 100/41, pulse 92, temperature 97.6 F (36.4 C), temperature source Oral, resp. rate (!) 26, weight 85.9 kg, SpO2 100 %.    Vent Mode: PRVC FiO2 (%):  [100 %] 100 % Set Rate:  [18 bmp] 18 bmp Vt Set:  [570 mL] 570 mL PEEP:  [5 cmH20] 5 cmH20   Intake/Output Summary (Last 24 hours) at 03/02/2021 1340 Last data filed at 03/02/2021 0867 Gross per 24 hour  Intake 1009 ml  Output 1100 ml  Net -91 ml   Filed Weights   02/28/21 0300 03/01/21 0355 03/02/21 0020  Weight: 87.8 kg 86.3 kg 85.9 kg    Examination: General: Chronically ill-appearing, elderly male lying in bed on vent in no acute distress HEENT: MM pink/moist ETT, anicteric Neuro: intermittent jerking, propofol initiated CV: s1s2 rrr, no m/r/g PULM: prolonged expiratory phase, lungs bilaterally with distant wheeze GI: soft, bsx4 active  Extremities: warm/dry, BLE 2+ pitting edema with Una boots in place, BUE edema  Skin: no rashes or lesions on exposed skin       Resolved Hospital Problem list     Assessment & Plan:   Cardiac Arrest with Shock Suspect respiratory leading to cardiac, likely aspiration. Wide pulse pressure concerning for distributive shock.    -ICU monitoring  -place central access for multiple infusions, volume assessment etc -levophed for MAP > 65  Acute on Chronic Hypoxic Respiratory Failure  COPD OSA Severe Mixed Venous / Arterial Pulmonary Hypertension  Baseline 3L dependent per record, hx COVID in 12/2020 -PRVC 8cc/kg  -wean PEEP / fiO2 for sats 88-95% -VAP prevention measures  -follow intermittent CXR -add pulmicort + brovana  -lasix 40 mg IV x1, follow response    Aspiration PNA  -follow CXR  -ceftriaxone for aspiration coverage, D1/x -send tracheal aspirate   PAF  HTN HLD CAD s/p CABG (1986) Prior TEE/DCCV, on  Eliquis at baseline at reduced dose with renal function. No hx of anginal sx.  -tele monitoring  -change eliquis to heparin gtt for now with anticipated AKI  -continue statin -hold home ACE-I -defer imdur, hydralazine to Cardiology  -assess Co-ox   CKD IIIb -anticipate rise in Sr Cr post arrest  -Trend BMP / urinary output -Replace electrolytes as indicated -Avoid nephrotoxic agents, ensure adequate renal perfusion -follow up labs now   Lactic Acidosis in setting of Cardiac Arrest  -follow lactate trend   DM II  Hgb A1c 6.4 -follow CBG -SSI   Hypothyroidism  TSH 12.388 > previously 3.5 in 07/2020 -follow up free T4 -continue synthroid PT  At Risk Malnutrition  -Begin TF per Nutrition  -change all meds to PT    Best practice (right click and "Reselect all SmartList Selections" daily)  Diet:  Tube Feed  Pain/Anxiety/Delirium protocol (if indicated): Yes (RASS goal -1) VAP protocol (if indicated): Yes DVT prophylaxis: Systemic AC GI prophylaxis: H2B Glucose control:  SSI No and Basal insulin Yes Central venous access:  Yes, and it is still needed Arterial line:  Yes, and it is still needed Foley:  N/A Mobility:  bed rest  PT consulted: N/A Last date of multidisciplinary goals of care discussion: Per Dr. Posey Pronto / Dr. Harrell Gave prior to transfer to ICU.  Code Status:  full code Disposition: ICU   Labs   CBC: Recent Labs  Lab 03/03/2021 1812 02/04/2021 2110 02/27/21 0213 02/28/21 0347 03/01/21 0208 03/02/21 0410  WBC 8.6  --  5.9 7.6 8.2 9.3  NEUTROABS 6.0  --   --   --   --   --   HGB 13.3 12.6* 11.8* 12.7* 12.3* 11.6*  HCT 43.2 37.0* 37.4* 39.0 39.3 36.3*  MCV 90.2  --  88.8 86.1 87.7 88.1  PLT 231  --  180 206 194 694    Basic Metabolic Panel: Recent Labs  Lab 02/25/2021 1812 02/14/2021 2110 02/27/21 0213 02/28/21 0347 03/01/21 0208 03/02/21 0410  NA 141 143 142 143 144 144  K 4.0 3.9 3.9 3.6 4.0 3.9  CL 104  --  108 106 107 107  CO2 29  --  27 30  31 31   GLUCOSE 107*  --  114* 116* 154* 118*  BUN 24*  --  24* 24* 23 23  CREATININE 1.61*  --  1.55* 1.53* 1.55* 1.50*  CALCIUM 8.8*  --  8.4* 8.6* 8.6* 8.5*  MG  --   --  2.1 2.2 2.3 2.1   GFR: Estimated Creatinine Clearance: 38.4 mL/min (A) (by C-G formula based on SCr of 1.5 mg/dL (H)). Recent Labs  Lab 02/27/21 0213 02/28/21 0347 03/01/21 0208 03/02/21 0410 03/02/21 1235  WBC 5.9 7.6 8.2 9.3  --   LATICACIDVEN  --   --   --   --  9.4*    Liver Function Tests: Recent Labs  Lab 02/27/21 0213  AST 40  ALT 26  ALKPHOS 83  BILITOT 1.4*  PROT 5.9*  ALBUMIN 2.6*   No results for input(s): LIPASE, AMYLASE in the last 168 hours. No results for input(s): AMMONIA in the last 168 hours.  ABG    Component Value Date/Time   PHART 7.329 (L) 02/19/2017 2054   PCO2ART 40.3 02/19/2017 2054   PO2ART 72.0 (L) 02/19/2017 2054   HCO3 31.1 (H) 02/11/2021 2110   TCO2 32 02/28/2021 2110   ACIDBASEDEF 4.5 (H) 02/19/2017 2054   O2SAT 100.0 02/28/2021 2110     Coagulation Profile: No results for input(s): INR, PROTIME in the last 168 hours.  Cardiac Enzymes: No results for input(s): CKTOTAL, CKMB, CKMBINDEX, TROPONINI in the last 168 hours.  HbA1C: Hgb A1c MFr Bld  Date/Time Value Ref Range Status  07/20/2020 04:24 PM 6.4 (H) 4.8 - 5.6 % Final    Comment:    (NOTE) Pre diabetes:          5.7%-6.4%  Diabetes:              >6.4%  Glycemic control for   <7.0% adults with diabetes   11/18/2016 06:06 PM 8.4 (H) 4.8 - 5.6 % Final    Comment:    (NOTE)         Pre-diabetes: 5.7 - 6.4         Diabetes: >6.4         Glycemic control for adults with diabetes: <7.0     CBG: Recent Labs  Lab 03/01/21 1203 03/01/21 1556 03/01/21 2104 03/02/21 0632 03/02/21 1327  GLUCAP 154* 227* 182* 112* 157*    Review of Systems:   Unable to complete as patient is altered on mechanical ventilation.   Past Medical History:  He,  has a past medical history of CHF (congestive heart  failure) ([place]), Chronic kidney disease, COPD (chronic obstructive pulmonary disease) ([place]), Coronary artery disease, Diabetes mellitus, Hypertension, Myocardial infarction ([place]), and Right renal mass.   Surgical History:   Past Surgical History:  Procedure Laterality Date  . CARDIAC CATHETERIZATION  1996  . CARDIOVERSION N/A 08/29/2020   Procedure: CARDIOVERSION;  Surgeon: Pixie Casino, MD;  Location: [facility] ENDOSCOPY;  Service: Cardiovascular;  Laterality: N/A;  . CORONARY ARTERY BYPASS GRAFT    . DOPPLER ECHOCARDIOGRAPHY  2011  . HERNIA REPAIR    . IR GENERIC HISTORICAL  12/09/2016   IR RADIOLOGIST EVAL & MGMT 12/09/2016 [name], MD [facility] INTERV RAD  . ROBOTIC ASSITED PARTIAL NEPHRECTOMY Right 02/19/2017   Procedure: XI ROBOTIC ASSITED PARTIAL NEPHRECTOMY;  Surgeon: [name], MD;  Location: [facility] ORS;  Service: Urology;  Laterality: Right;  . TEE WITHOUT CARDIOVERSION N/A 08/29/2020   Procedure: TRANSESOPHAGEAL ECHOCARDIOGRAM (TEE);  Surgeon: Pixie Casino, MD;  Location[facility] ENDOSCOPY;  Service: Cardiovascular;  Laterality: N/A;     Social History:   reports that he quit smoking about 24 years ago. His smoking use included cigarettes. He has a 13.50 pack-year smoking history. He has never used smokeless tobacco. He reports previous alcohol use of about 13.0 standard drinks of alcohol per week. He reports that he does not use drugs.   Family History:  His family history includes Diabetes in his mother and sister.   Allergies No Known Allergies   Home Medications  Prior to Admission medications   Medication Sig Start Date End Date Taking? Authorizing Provider  acetaminophen (TYLENOL) 325 MG tablet Take 650 mg by mouth every 6 (six) hours as needed (for pain.).   Yes [provider]  amLODipine (NORVASC) 5  MG tablet Take 5 mg by mouth daily. 11/10/20  Yes [provider]  apixaban (ELIQUIS) 2.5 MG TABS tablet Take 1 tablet (2.5 mg total) by mouth 2 (two) times  daily. 08/15/20  Yes Hilty, Nadean Corwin, MD  benazepril (LOTENSIN) 40 MG tablet Take 1 tablet (40 mg total) by mouth daily. 01/07/21  Yes Elgergawy, Silver Huguenin, MD  cloNIDine (CATAPRES) 0.1 MG tablet Take 0.1 mg by mouth 2 (two) times daily.   Yes [provider]  dexamethasone (DECADRON) 6 MG tablet Take 1 tablet (6 mg total) by mouth daily. 01/03/21  Yes Elgergawy, Silver Huguenin, MD  ezetimibe (ZETIA) 10 MG tablet Take 10 mg by mouth daily.   Yes [provider]  furosemide (LASIX) 80 MG tablet Take 1 tablet (80 mg total) by mouth 2 (two) times daily. 09/20/20  Yes Hilty, Nadean Corwin, MD  HUMALOG KWIKPEN 100 UNIT/ML KiwkPen Inject 6-8 Units into the skin 3 (three) times daily.  07/25/15  Yes [provider]  icosapent Ethyl (VASCEPA) 1 g capsule TAKE 2 CAPSULES(2 GRAMS) BY MOUTH TWICE DAILY Patient taking differently: Take 2 g by mouth 2 (two) times daily. 09/26/20  Yes Hilty, Nadean Corwin, MD  LANTUS SOLOSTAR 100 UNIT/ML Solostar Pen Inject 15 Units into the skin every evening. 02/23/19  Yes [provider]  levothyroxine (SYNTHROID) 100 MCG tablet Take 100 mcg by mouth daily before breakfast.   Yes [provider]  potassium chloride (KLOR-CON) 10 MEQ tablet Take 1 tablet (10 mEq total) by mouth 2 (two) times daily. 01/06/21  Yes Elgergawy, Silver Huguenin, MD  Propylene Glycol (SYSTANE BALANCE) 0.6 % SOLN Place 1 drop into both eyes daily as needed (dry eyes).   Yes [provider]  rosuvastatin (CRESTOR) 20 MG tablet TAKE 1 TABLET BY MOUTH EVERY DAY 02/13/21  Yes Hilty, Nadean Corwin, MD     Critical care time: 34 minutes     Noe Gens, MSN, APRN, NP-C, AGACNP-BC [facility] 03/02/2021, 1:40 PM   Please see [email or web address] for pager details.   From 7A-7P if no response, please call [phone] After hours, please call ELink [phone]

## 2021-03-02 NOTE — Progress Notes (Signed)
[facility] Progress Note  Patient: [name]    YYT:[id number]  DOA: 03/01/2021     Date of Service: the patient was seen and examined on 03/02/2021  Brief hospital course: Past medical history of chronic CHF, chronic A. fib, HTN, CKD 3B, COPD.  Presents with complaints of worsening shortness of breath found to have acute on chronic diastolic CHF with acute hypoxic respiratory failure. Sustained respiratory arrest on 3/27.  CODE BLUE was called. Currently plan is to transition care to ICU.  Assessment and Plan: 1.  Acute on chronic diastolic CHF Severe pulmonary hypertension mixed venous and arterial Acute on chronic hypoxic respiratory failure Initially needing BiPAP. On baseline 3 LPM oxygen Suspect that patient has poor lung reserve given his recent Covid pneumonia. Received IV albumin on 3/26 to assist with diuresis Currently intubated.  2.    Respiratory/cardiac arrest  Patient was off of the BiPAP since 3/25. On 3/27 patient developed progressive hypoxia despite being on 15L HFNC. Transition to heated high flow at 40 L. After few minutes CCMD notified the RN that the patient is desaturating. On evaluating the patient patient was found pulseless.  CPR was initiated [place] was called.  ROSC was achieved and lost twice.  Total 25 minutes CPR performed.  Intubated by CRNA. Third time ROSC was sustained.  Patient was transferred to ICU.  Peripheral levo was started.  Propofol and Versed were ordered.  Chest x-ray, gastric tube, OG ordered. On OG insertion patient did have some biliary output which was connected to suction temporarily. Detailed discussion with patient's wife and daughter regarding events of today. Explained to them that patient's mentation and neuro recovery would be the most important question but patient remains at risk for worsening of his renal dysfunction, worsening of cardiac dysfunction as well as inability of extubation given his respiratory  dysfunction.  3.  CAD S/P CABG Elevated troponin Paroxysmal A. fib Currently rate controlled. Continue Eliquis. Elevated troponin likely demand ischemia. No evidence of acute ischemia right now. Management per cardiology. Appreciate cardiology assistance in reaching out family.  4.  Accelerated hypertension Shock Potentially can contribute to patient's current presentation. Blood pressure significantly elevated initially. Medications were adjusted. Post cardiac arrest blood pressure is soft and currently requiring vasopressors.  5.  HLD Continue Zetia and statin.  6.  Type 2 diabetes mellitus, uncontrolled with hypoglycemia in setting of poor p.o. intake along with chronic kidney disease and without long-term insulin use. Hemoglobin A1c 6.4. Continue sliding scale insulin.  7.  Hypothyroidism. TSH significantly elevated. Normal free T4. Continue current Synthroid dose.  8.  Mixed pulmonary and venous arterial hypertension. Patient follows up with cardiology outpatient. Currently stable.  Monitor.  9.  COPD. OSA No evidence of exacerbation. Currently intubated.  10.  Anemia of chronic kidney disease. Hemoglobin stable. Monitor.  11.  CKD 3B.  Intermittently worsening to CKD stage IV. History of renal mass. History of nephrectomy. Renal function currently stable.  Monitor. At risk for worsening post cardiac arrest.  Diet: NPO DVT Prophylaxis:   apixaban (ELIQUIS) tablet 2.5 mg Start: 02/06/2021 2330 apixaban (ELIQUIS) tablet 2.5 mg    Advance goals of care discussion: Full code  Family Communication: family was present at bedside, at the time of interview.  The pt provided permission to discuss medical plan with the family. Opportunity was given to ask question and all questions were answered satisfactorily.   Disposition:  Status is: Inpatient  Remains inpatient appropriate because:Inpatient level of care appropriate  due to severity of  illness  Dispo:  Patient From: Home  Planned Disposition: to be determined  Medically stable for discharge: No   Subjective: Had discussion with the patient in the morning.  Telling me that he is feeling fine.  No acute complaints.  Continues to have shortness of breath.  No nausea no vomiting.  No chest pain or chest tightness.  Later on become more hypoxic requiring heated high flow.  Despite being on heated high flow develops respiratory distress.  CCMD notifies RN and when RN goes to check on the patient she found the patient pulseless.  CODE BLUE was called.  ROSC was achieved after 25 minutes of CPR and patient was transferred to ICU.  Physical Exam:  Early in the morning General: Appear in mild distress, no Rash; Oral Mucosa Clear, moist. no Abnormal Neck Mass Or lumps, Conjunctiva normal  Cardiovascular: S1 and S2 Present, no Murmur, Respiratory: increased respiratory effort, Bilateral Air entry present and bilateral  Crackles, no wheezes Abdomen: Bowel Sound present, Soft and no tenderness Extremities: trace Pedal edema Neurology: alert and oriented to time, place, and person affect appropriate. no new focal deficit Gait not checked due to patient safety concerns   Later after CPR.  Minimally responsive.  Bilateral crackles.  S1-S2 present.  Bowel sounds present.   Vitals:   03/02/21 1240 03/02/21 1242 03/02/21 1244 03/02/21 1245  BP:  92/61 (!) 97/42 (!) 100/41  Pulse: 93 92 94 92  Resp: (!) 22 20 (!) 25 (!) 26  Temp:      TempSrc:      SpO2: 98% 99% 100% 100%  Weight:        Intake/Output Summary (Last 24 hours) at 03/02/2021 1303 Last data filed at 03/02/2021 0953 Gross per 24 hour  Intake 1009 ml  Output 1100 ml  Net -91 ml   Filed Weights   02/28/21 0300 03/01/21 0355 03/02/21 0020  Weight: 87.8 kg 86.3 kg 85.9 kg    Data Reviewed: I have personally reviewed and interpreted daily labs, tele strips, imaging. I reviewed all nursing notes, pharmacy notes,  vitals, pertinent old records I have discussed plan of care as described above with RN and patient/family.  CBC: Recent Labs  Lab 02/27/2021 1812 02/05/2021 2110 02/27/21 0213 02/28/21 0347 03/01/21 0208 03/02/21 0410  WBC 8.6  --  5.9 7.6 8.2 9.3  NEUTROABS 6.0  --   --   --   --   --   HGB 13.3 12.6* 11.8* 12.7* 12.3* 11.6*  HCT 43.2 37.0* 37.4* 39.0 39.3 36.3*  MCV 90.2  --  88.8 86.1 87.7 88.1  PLT 231  --  180 206 194 287   Basic Metabolic Panel: Recent Labs  Lab 03/02/2021 1812 02/11/2021 2110 02/27/21 0213 02/28/21 0347 03/01/21 0208 03/02/21 0410  NA 141 143 142 143 144 144  K 4.0 3.9 3.9 3.6 4.0 3.9  CL 104  --  108 106 107 107  CO2 29  --  _0 GLUCOSE 107*  --  114* 116* 154* 118*  BUN 24*  --  24* 24* 23 23  CREATININE 1.61*  --  1.55* 1.53* 1.55* 1.50*  CALCIUM 8.8*  --  8.4* 8.6* 8.6* 8.5*  MG  --   --  2.1 2.2 2.3 2.1    Studies: DG CHEST PORT 1 VIEW  Result Date: 03/02/2021 CLINICAL DATA:  Shortness of breath EXAM: PORTABLE CHEST 1 VIEW COMPARISON:  02/28/2021 and prior  studies FINDINGS: Cardiomegaly, CABG changes, bilateral interstitial/airspace opacities and bilateral pleural effusions are again identified and unchanged. There is no evidence of pneumothorax. No acute bony abnormalities are identified. IMPRESSION: Unchanged appearance of the chest with bilateral interstitial/airspace opacities and bilateral pleural effusions. Electronically Signed   By: Margarette Canada M.D.   On: 03/02/2021 08:39    Scheduled Meds: . apixaban  2.5 mg Oral BID  . chlorhexidine gluconate (MEDLINE KIT)  15 mL Mouth Rinse BID  . Chlorhexidine Gluconate Cloth  6 each Topical Daily  . docusate  100 mg Per Tube BID  . ezetimibe  10 mg Oral Daily  . insulin aspart  1-3 Units Subcutaneous Q4H  . levothyroxine  112 mcg Oral QAC breakfast  . mouth rinse  15 mL Mouth Rinse 10 times per day  . [START ON 03/03/2021] polyethylene glycol  17 g Per Tube Daily  . rosuvastatin  20 mg  Oral Daily   Continuous Infusions: . sodium chloride    . sodium chloride    . famotidine (PEPCID) IV    . norepinephrine (LEVOPHED) Adult infusion 8 mcg/min (03/02/21 1234)  . propofol (DIPRIVAN) infusion 5 mcg/kg/min (03/02/21 1225)  . vasopressin     PRN Meds: acetaminophen **OR** acetaminophen, dextrose, fentaNYL (SUBLIMAZE) injection, fentaNYL (SUBLIMAZE) injection, hydrALAZINE, ipratropium, polyethylene glycol, polyvinyl alcohol  Time spent: 35 minutes  Author: Berle Mull, MD [facility] 03/02/2021 1:03 PM  To reach On-call, see care teams to locate the attending and reach out via [email or web address]. Between 7PM-7AM, please contact night-coverage If you still have difficulty reaching the attending provider, please page the [facility] (Director on Call) for [facility] on amion for assistance.

## 2021-03-02 NOTE — Anesthesia Procedure Notes (Signed)
Procedure Name: Intubation Date/Time: 03/02/2021 11:37 AM Performed by: Babs Bertin, CRNA Pre-anesthesia Checklist: Patient identified, Emergency Drugs available, Suction available and Patient being monitored Oxygen Delivery Method: Ambu bag Preoxygenation: Pre-oxygenation with 100% oxygen Laryngoscope Size: Mac and 4 Grade View: Grade I Tube type: Subglottic suction tube Tube size: 7.5 mm Number of attempts: 1 Airway Equipment and Method: Stylet and Oral airway Placement Confirmation: ETT inserted through vocal cords under direct vision,  positive ETCO2 and breath sounds checked- equal and bilateral Secured at: 23 cm Tube secured with: Tape Dental Injury: Teeth and Oropharynx as per pre-operative assessment

## 2021-03-02 NOTE — Progress Notes (Signed)
   03/02/21 1130  Clinical Encounter Type  Visited With Health care provider  Visit Type Code  Referral From  (Code Blue Page)  Consult/Referral To Chaplain  Chaplain responded to Code [employer].  Upon arrival Medical Team was working to revive [name]. Med Team worked until he was stable, MD informed [name] wife [name] was called and she is on her way to the hospital.  MD could not reach his 2 Daughters [name] and [name]; a message was left.  [name] will be transferred to 2H09.  Chaplain was not needed at this time.  Chaplain Vincella Morgan-Simpson  [phone]

## 2021-03-02 NOTE — Procedures (Signed)
Central Venous Catheter Insertion Procedure Note  FAVOR HACKLER  [id number]  Mar 19, 1932  Date:03/02/21  Time:2:05 PM   Provider Performing:Karlene Southard   Procedure: Insertion of Non-tunneled Central Venous [phone]) with US guidance ([id number])   Indication(s) Medication administration  Consent Risks of the procedure as well as the alternatives and risks of each were explained to the patient and/or caregiver.  Consent for the procedure was obtained and is signed in the bedside chart  Anesthesia Topical only with 1% lidocaine   Timeout Verified patient identification, verified procedure, site/side was marked, verified correct patient position, special equipment/implants available, medications/allergies/relevant history reviewed, required imaging and test results available.  Sterile Technique Maximal sterile technique including full sterile barrier drape, hand hygiene, sterile gown, sterile gloves, mask, hair covering, sterile ultrasound probe cover (if used).  Procedure Description Area of catheter insertion was cleaned with chlorhexidine and draped in sterile fashion.  With real-time ultrasound guidance a central venous catheter was placed into the right subclavian vein. Nonpulsatile blood flow and easy flushing noted in all ports.  The catheter was sutured in place and sterile dressing applied.  Complications/Tolerance None; patient tolerated the procedure well. Chest X-ray is ordered to verify placement for internal jugular or subclavian cannulation.   Chest x-ray is not ordered for femoral cannulation.  EBL Minimal  Specimen(s) None

## 2021-03-02 NOTE — Procedures (Signed)
Arterial Catheter Insertion Procedure Note  [name]  [id number]  03/17/1932  Date:03/02/21  Time:2:31 PM    Provider Performing: Roselyn Meier    Procedure: Insertion of Arterial Line [phone]) without US guidance  Indication(s) Blood pressure monitoring and/or need for frequent ABGs  Consent Unable to obtain consent due to emergent nature of procedure.  Anesthesia None   Time Out Verified patient identification, verified procedure, site/side was marked, verified correct patient position, special equipment/implants available, medications/allergies/relevant history reviewed, required imaging and test results available.   Sterile Technique Maximal sterile technique including full sterile barrier drape, hand hygiene, sterile gown, sterile gloves, mask, hair covering, sterile ultrasound probe cover (if used).   Procedure Description Area of catheter insertion was cleaned with chlorhexidine and draped in sterile fashion. Without real-time ultrasound guidance an arterial catheter was placed into the left radial artery.  Appropriate arterial tracings confirmed on monitor.     Complications/Tolerance None; patient tolerated the procedure well.   EBL Minimal   Specimen(s) None

## 2021-03-02 NOTE — Progress Notes (Signed)
Notified of desaturations by CCMD. Upon arrival to the room patient unresponsive and no pulse felt. Code blue intitiated and compressions started at 1125. Dr. Wynona Neat at bedside to help fun code and floor RNs arrived with crash cart. ACLS started with medications and code team arrived. Family notified by Dr. Harrell Gave and attending MD notifed

## 2021-03-02 NOTE — Procedures (Signed)
ELECTROENCEPHALOGRAM  The study was performed utilizing the standard international 10/20 electrode placement system.  The background rhythm in the interpretable portion of this record consists of diffusely low-voltage fast activity in the beta range with no evidence of wakefulness. Sleep architecture is not identified.  Photic stimulation was not performed. It is not clear if the technologists temporarily turned off the sedation infusions.  No epileptiform discharges, focal or asymmetrical abnormalities were seen.  Impression: this is an abnormal EEG. Low-voltage fast activity is primarily the affect of sedating medication. Background rhythm cannot be identified. Clinical correlation is necessary.

## 2021-03-02 NOTE — Progress Notes (Signed)
ANTICOAGULATION CONSULT NOTE - Initial Consult  Pharmacy Consult for *Heparin (while Eliquis on hold) Indication: atrial fibrillation  No Known Allergies  Patient Measurements: Height: 5\' 9"  (175.3 cm) Weight: 85.9 kg (189 lb 6 oz) IBW/kg (Calculated) : 70.7 Heparin Dosing Weight: 85.9 kg  Vital Signs: Temp: 97.6 F (36.4 C) (03/27 0801) Temp Source: Oral (03/27 0801) BP: 100/41 (03/27 1245) Pulse Rate: 92 (03/27 1245)  Labs: Recent Labs    02/28/21 0347 03/01/21 0208 03/02/21 0410  HGB 12.7* 12.3* 11.6*  HCT 39.0 39.3 36.3*  PLT 206 194 187  CREATININE 1.53* 1.55* 1.50*    Estimated Creatinine Clearance: 38.4 mL/min (A) (by C-G formula based on SCr of 1.5 mg/dL (H)).   Medical History: Past Medical History:  Diagnosis Date  . CHF (congestive heart failure) ([place])   . Chronic kidney disease    tumor right kidney  . COPD (chronic obstructive pulmonary disease) ([place])   . Coronary artery disease   . Diabetes mellitus    type 2  . Hypertension   . Myocardial infarction ([place])    1997  . Right renal mass     Medications:  Infusions:  . sodium chloride    . famotidine (PEPCID) IV    . heparin    . norepinephrine (LEVOPHED) Adult infusion    . propofol (DIPRIVAN) infusion 5 mcg/kg/min (03/02/21 1225)  . vasopressin 0.03 Units/min (03/02/21 1306)    Assessment: 87 yo male on chronic Eliquis, now s/p PEA arrest today after aspirating on food.  Pharmacy asked to change Eliquis to IV heparin.  Eliquis last dose given this AM at 10 AM.  No overt bleeding or complications noted.   Goal of Therapy:  Heparin level 0.3-0.7 units/ml Monitor platelets by anticoagulation protocol: Yes   Plan:  Heparin 1200 units/hr to start at 10 pm tonight, no bolus.  (12 hrs after last Eliquis) Check heparin level 8 hrs after gtt starts. Daily aPTT, heparin level and CBC.  [place] Crane, Roylene Reason, BCCP Clinical Pharmacist  03/02/2021 2:08 PM   [facility] pharmacy phone numbers  are listed on [email or web address]

## 2021-03-02 NOTE — Significant Event (Signed)
Code blue brief documentation: I was on the floor, about to round on the patient when a code blue was called. Nursing had entered the room and was unable to get a pulse.  Initial rhythm was PEA, followed by asystole, then PEA. I provided compressions and management until the code team arrived. He received two rounds of epinephrine by that time, no ROSC.  On arrival of the code team, I left to call family. I first called Muscab Brenneman and reached her on her home phone. I discussed with her that Mr. Heyer did not have a pulse and that we we doing CPR. She will find someone to bring her to the hospital. At her request, I have also attempted to contact both [name] and Denita Lung, but these go straight to voicemail.  I will continue to try to reach the daughters.  Buford Dresser, MD, PhD, [place]  [address], [place], [place] [address] [phone]

## 2021-03-03 ENCOUNTER — Inpatient Hospital Stay (HOSPITAL_COMMUNITY): Payer: Medicare Other

## 2021-03-03 ENCOUNTER — Encounter (HOSPITAL_BASED_OUTPATIENT_CLINIC_OR_DEPARTMENT_OTHER): Payer: Medicare Other | Admitting: Internal Medicine

## 2021-03-03 DIAGNOSIS — J439 Emphysema, unspecified: Secondary | ICD-10-CM | POA: Diagnosis not present

## 2021-03-03 DIAGNOSIS — I5033 Acute on chronic diastolic (congestive) heart failure: Secondary | ICD-10-CM | POA: Diagnosis not present

## 2021-03-03 DIAGNOSIS — E440 Moderate protein-calorie malnutrition: Secondary | ICD-10-CM | POA: Insufficient documentation

## 2021-03-03 DIAGNOSIS — I10 Essential (primary) hypertension: Secondary | ICD-10-CM

## 2021-03-03 DIAGNOSIS — I4891 Unspecified atrial fibrillation: Secondary | ICD-10-CM | POA: Diagnosis not present

## 2021-03-03 DIAGNOSIS — J9601 Acute respiratory failure with hypoxia: Secondary | ICD-10-CM | POA: Diagnosis not present

## 2021-03-03 LAB — POCT I-STAT 7, (LYTES, BLD GAS, ICA,H+H)
Acid-Base Excess: 2 mmol/L (ref 0.0–2.0)
Acid-Base Excess: 7 mmol/L — ABNORMAL HIGH (ref 0.0–2.0)
Bicarbonate: 30.4 mmol/L — ABNORMAL HIGH (ref 20.0–28.0)
Bicarbonate: 32.6 mmol/L — ABNORMAL HIGH (ref 20.0–28.0)
Calcium, Ion: 1.2 mmol/L (ref 1.15–1.40)
Calcium, Ion: 1.12 mmol/L — ABNORMAL LOW (ref 1.15–1.40)
HCT: 33 % — ABNORMAL LOW (ref 39.0–52.0)
HCT: 30 % — ABNORMAL LOW (ref 39.0–52.0)
Hemoglobin: 11.2 g/dL — ABNORMAL LOW (ref 13.0–17.0)
Hemoglobin: 10.2 g/dL — ABNORMAL LOW (ref 13.0–17.0)
O2 Saturation: 86 %
O2 Saturation: 100 %
Patient temperature: 37.7
Patient temperature: 37.5
Potassium: 4.7 mmol/L (ref 3.5–5.1)
Potassium: 4.5 mmol/L (ref 3.5–5.1)
Sodium: 142 mmol/L (ref 135–145)
Sodium: 143 mmol/L (ref 135–145)
TCO2: 32 mmol/L (ref 22–32)
TCO2: 34 mmol/L — ABNORMAL HIGH (ref 22–32)
pCO2 arterial: 68.5 mmHg (ref 32.0–48.0)
pCO2 arterial: 51.8 mmHg — ABNORMAL HIGH (ref 32.0–48.0)
pH, Arterial: 7.259 — ABNORMAL LOW (ref 7.350–7.450)
pH, Arterial: 7.409 (ref 7.350–7.450)
pO2, Arterial: 64 mmHg — ABNORMAL LOW (ref 83.0–108.0)
pO2, Arterial: 477 mmHg — ABNORMAL HIGH (ref 83.0–108.0)

## 2021-03-03 LAB — BASIC METABOLIC PANEL
Anion gap: 5 (ref 5–15)
BUN: 35 mg/dL — ABNORMAL HIGH (ref 8–23)
CO2: 29 mmol/L (ref 22–32)
Calcium: 8.2 mg/dL — ABNORMAL LOW (ref 8.9–10.3)
Chloride: 107 mmol/L (ref 98–111)
Creatinine, Ser: 2.27 mg/dL — ABNORMAL HIGH (ref 0.61–1.24)
GFR, Estimated: 27 mL/min — ABNORMAL LOW (ref >60)
Glucose, Bld: 226 mg/dL — ABNORMAL HIGH (ref 70–99)
Potassium: 4.6 mmol/L (ref 3.5–5.1)
Sodium: 141 mmol/L (ref 135–145)

## 2021-03-03 LAB — MAGNESIUM
Magnesium: 2.1 mg/dL (ref 1.7–2.4)
Magnesium: 2.2 mg/dL (ref 1.7–2.4)

## 2021-03-03 LAB — CBC
HCT: 33.3 % — ABNORMAL LOW (ref 39.0–52.0)
Hemoglobin: 10.2 g/dL — ABNORMAL LOW (ref 13.0–17.0)
MCH: 27.4 pg (ref 26.0–34.0)
MCHC: 30.6 g/dL (ref 30.0–36.0)
MCV: 89.5 fL (ref 80.0–100.0)
Platelets: 191 10*3/uL (ref 150–400)
RBC: 3.72 MIL/uL — ABNORMAL LOW (ref 4.22–5.81)
RDW: 18.3 % — ABNORMAL HIGH (ref 11.5–15.5)
WBC: 12.6 10*3/uL — ABNORMAL HIGH (ref 4.0–10.5)
nRBC: 0 % (ref 0.0–0.2)

## 2021-03-03 LAB — APTT
aPTT: 87 seconds — ABNORMAL HIGH (ref 24–36)
aPTT: 132 seconds — ABNORMAL HIGH (ref 24–36)

## 2021-03-03 LAB — PHOSPHORUS
Phosphorus: 4.4 mg/dL (ref 2.5–4.6)
Phosphorus: 4.7 mg/dL — ABNORMAL HIGH (ref 2.5–4.6)

## 2021-03-03 LAB — HEPARIN LEVEL (UNFRACTIONATED): Heparin Unfractionated: 1.46 IU/mL — ABNORMAL HIGH (ref 0.30–0.70)

## 2021-03-03 LAB — GLUCOSE, CAPILLARY
Glucose-Capillary: 223 mg/dL — ABNORMAL HIGH (ref 70–99)
Glucose-Capillary: 180 mg/dL — ABNORMAL HIGH (ref 70–99)
Glucose-Capillary: 149 mg/dL — ABNORMAL HIGH (ref 70–99)
Glucose-Capillary: 139 mg/dL — ABNORMAL HIGH (ref 70–99)
Glucose-Capillary: 128 mg/dL — ABNORMAL HIGH (ref 70–99)

## 2021-03-03 LAB — TRIGLYCERIDES: Triglycerides: 62 mg/dL (ref <150)

## 2021-03-03 LAB — LACTIC ACID, PLASMA: Lactic Acid, Venous: 1.2 mmol/L (ref 0.5–1.9)

## 2021-03-03 MED ORDER — FUROSEMIDE 10 MG/ML IJ SOLN
100.0000 mg | Freq: Once | INTRAVENOUS | Status: AC
  Administered 2021-03-03: 100 mg via INTRAVENOUS
  Filled 2021-03-03: qty 10

## 2021-03-03 MED ORDER — PROSOURCE TF PO LIQD
45.0000 mL | Freq: Four times a day (QID) | ORAL | Status: DC
  Administered 2021-03-03 – 2021-03-04 (×5): 45 mL
  Filled 2021-03-03 (×5): qty 45

## 2021-03-03 MED ORDER — FUROSEMIDE 10 MG/ML IJ SOLN
12.0000 mg/h | INTRAVENOUS | Status: DC
  Administered 2021-03-03: 8 mg/h via INTRAVENOUS
  Filled 2021-03-03 (×2): qty 20

## 2021-03-03 MED ORDER — SODIUM CHLORIDE 0.9% FLUSH
10.0000 mL | Freq: Two times a day (BID) | INTRAVENOUS | Status: DC
  Administered 2021-03-03 (×2): 10 mL

## 2021-03-03 MED ORDER — FUROSEMIDE 10 MG/ML IJ SOLN
80.0000 mg | Freq: Once | INTRAMUSCULAR | Status: AC
  Administered 2021-03-03: 80 mg via INTRAVENOUS
  Filled 2021-03-03: qty 8

## 2021-03-03 MED ORDER — VITAL 1.5 CAL PO LIQD
1000.0000 mL | ORAL | Status: DC
  Administered 2021-03-03 – 2021-03-04 (×2): 1000 mL
  Filled 2021-03-03: qty 1000

## 2021-03-03 MED ORDER — MIDAZOLAM BOLUS VIA INFUSION
2.0000 mg | INTRAVENOUS | Status: DC | PRN
  Filled 2021-03-03: qty 2

## 2021-03-03 MED ORDER — FENTANYL BOLUS VIA INFUSION
25.0000 ug | INTRAVENOUS | Status: DC | PRN
  Filled 2021-03-03: qty 100

## 2021-03-03 MED ORDER — FENTANYL 2500MCG IN NS 250ML (10MCG/ML) PREMIX INFUSION
0.0000 ug/h | INTRAVENOUS | Status: DC
  Administered 2021-03-03: 175 ug/h via INTRAVENOUS
  Administered 2021-03-03: 25 ug/h via INTRAVENOUS
  Administered 2021-03-04: 200 ug/h via INTRAVENOUS
  Filled 2021-03-03 (×3): qty 250

## 2021-03-03 MED ORDER — LEVALBUTEROL HCL 0.63 MG/3ML IN NEBU
INHALATION_SOLUTION | RESPIRATORY_TRACT | Status: AC
  Administered 2021-03-03: 0.63 mg
  Filled 2021-03-03: qty 3

## 2021-03-03 MED ORDER — MIDAZOLAM 50MG/50ML (1MG/ML) PREMIX INFUSION
0.5000 mg/h | INTRAVENOUS | Status: DC
  Administered 2021-03-03: 3 mg/h via INTRAVENOUS
  Administered 2021-03-03: 0.5 mg/h via INTRAVENOUS
  Administered 2021-03-04: 3 mg/h via INTRAVENOUS
  Filled 2021-03-03 (×3): qty 50

## 2021-03-03 NOTE — Progress Notes (Signed)
RT was called by RN to come to bedside due to pt desaturation. RT at bedside to get report from RN that she had to manually bag pt due to SpO2 in low 80's. RT gave pt breathing treatment. RN spoke with CCM. Per CCM, increased pt's PEEP to 10 and RR to 22. RT will continue to monitor pt.

## 2021-03-03 NOTE — Progress Notes (Signed)
OT Cancellation Note  Patient Details Name: [name] MRN: [id number] DOB: Jun 30, 1932   Cancelled Treatment:    Reason Eval/Treat Not Completed: Medical issues which prohibited therapy;Patient not medically ready (pt with code and intubation 3/27. Current FiO2 60% and not medically appropriate. Will sign off and await new order.)  [place], OTR/L Acute Rehab Pager: [phone] Office: [phone] 03/03/2021, 10:24 AM

## 2021-03-03 NOTE — Progress Notes (Signed)
[place] for Heparin Indication: atrial fibrillation  No Known Allergies  Patient Measurements: Height: 5\' 9"  (175.3 cm) Weight: 86.7 kg (191 lb 2.2 oz) IBW/kg (Calculated) : 70.7 Heparin Dosing Weight: 85.9 kg  Vital Signs: Temp: 98.8 F (37.1 C) (03/28 0600) Temp Source: Bladder (03/28 0400) BP: 137/87 (03/28 0600) Pulse Rate: 96 (03/28 0600)  Labs: Recent Labs    03/02/21 0410 03/02/21 1243 03/02/21 1259 03/02/21 1435 03/02/21 2218 03/03/21 0507  HGB 11.6*  --  11.8* 11.2*  --  10.2*  HCT 36.3*  --  39.5 33.0*  --  33.3*  PLT 187  --  208  --   --  191  APTT  --   --   --   --   --  87*  LABPROT  --   --  19.4*  --   --   --   INR  --   --  1.7*  --   --   --   CREATININE 1.50*  --  1.93*  --  2.10*  --   TROPONINIHS  --  168* 157*  --   --   --     Estimated Creatinine Clearance: 27.5 mL/min (A) (by C-G formula based on SCr of 2.1 mg/dL (H)).  Assessment: 87 y.o. male with h/o Afib, Eliquis on hold, for heparin  Goal of Therapy:  Heparin level 0.3-0.7 units/ml Monitor platelets by anticoagulation protocol: Yes   Plan:  Continue Heparin at current rate    Phillis Knack, PharmD, BCPS

## 2021-03-03 NOTE — Plan of Care (Signed)
  Problem: Activity: Goal: Risk for activity intolerance will decrease Outcome: Progressing   Problem: Nutrition: Goal: Adequate nutrition will be maintained Outcome: Progressing   Problem: Coping: Goal: Level of anxiety will decrease Outcome: Progressing   Problem: Pain Managment: Goal: General experience of comfort will improve Outcome: Progressing   Problem: Safety: Goal: Ability to remain free from injury will improve Outcome: Progressing   Problem: Skin Integrity: Goal: Risk for impaired skin integrity will decrease Outcome: Progressing   Problem: Education: Goal: Ability to demonstrate management of disease process will improve Outcome: Not Progressing   Problem: Activity: Goal: Capacity to carry out activities will improve Outcome: Not Progressing   Problem: Cardiac: Goal: Ability to achieve and maintain adequate cardiopulmonary perfusion will improve Outcome: Not Progressing   Problem: Education: Goal: Knowledge of General Education information will improve Description: Including pain rating scale, medication(s)/side effects and non-pharmacologic comfort measures Outcome: Not Progressing   Problem: Health Behavior/Discharge Planning: Goal: Ability to manage health-related needs will improve Outcome: Not Progressing   Problem: Clinical Measurements: Goal: Ability to maintain clinical measurements within normal limits will improve Outcome: Not Progressing Goal: Will remain free from infection Outcome: Not Progressing Goal: Respiratory complications will improve Outcome: Not Progressing Goal: Cardiovascular complication will be avoided Outcome: Not Progressing   Problem: Elimination: Goal: Will not experience complications related to bowel motility Outcome: Not Progressing Goal: Will not experience complications related to urinary retention Outcome: Not Progressing

## 2021-03-03 NOTE — Progress Notes (Signed)
Progress Note  Patient Name: [name] Date of Encounter: 03/03/2021  [facility] Cardiologist: Pixie Casino, MD   Subjective   The patient is intubated and sedated.  Inpatient Medications    Scheduled Meds: . arformoterol  15 mcg Nebulization BID  . budesonide (PULMICORT) nebulizer solution  0.5 mg Nebulization BID  . chlorhexidine gluconate (MEDLINE KIT)  15 mL Mouth Rinse BID  . Chlorhexidine Gluconate Cloth  6 each Topical Daily  . docusate  100 mg Per Tube BID  . ezetimibe  10 mg Per Tube Daily  . feeding supplement (PROSource TF)  45 mL Per Tube QID  . insulin aspart  0-9 Units Subcutaneous Q4H  . levothyroxine  112 mcg Per Tube QAC breakfast  . mouth rinse  15 mL Mouth Rinse 10 times per day  . polyethylene glycol  17 g Per Tube Daily  . rosuvastatin  20 mg Per Tube Daily  . sodium chloride flush  10-40 mL Intracatheter Q12H  . Thrombi-Pad  1 each Topical Once   Continuous Infusions: . sodium chloride Stopped (03/03/21 0117)  . cefTRIAXone (ROCEPHIN)  IV Stopped (03/02/21 1611)  . famotidine (PEPCID) IV Stopped (03/02/21 2236)  . feeding supplement (VITAL 1.5 CAL)    . fentaNYL infusion INTRAVENOUS 75 mcg/hr (03/03/21 1200)  . heparin 1,200 Units/hr (03/03/21 1200)  . norepinephrine (LEVOPHED) Adult infusion 1 mcg/min (03/03/21 1200)  . propofol (DIPRIVAN) infusion 15 mcg/kg/min (03/03/21 1200)  . vasopressin 0.01 Units/min (03/03/21 1200)   PRN Meds: acetaminophen **OR** acetaminophen, dextrose, fentaNYL, hydrALAZINE, polyvinyl alcohol   Vital Signs    Vitals:   03/03/21 1030 03/03/21 1120 03/03/21 1130 03/03/21 1200  BP: 98/60 (!) 126/58 (!) 119/94 130/90  Pulse: 98 94 97 98  Resp: (!) '9 10 10 ' (!) 7  Temp: 98.96 F (37.2 C)  98.96 F (37.2 C) 99.14 F (37.3 C)  TempSrc:      SpO2: 97% 95% 94% 100%  Weight:      Height:        Intake/Output Summary (Last 24 hours) at 03/03/2021 1250 Last data filed at 03/03/2021 1200 Gross per 24 hour   Intake 2559.32 ml  Output 395 ml  Net 2164.32 ml   Last 3 Weights 03/03/2021 03/02/2021 03/02/2021  Weight (lbs) 191 lb 2.2 oz 189 lb 6 oz 189 lb 4.8 oz  Weight (kg) 86.7 kg 85.9 kg 85.866 kg      Telemetry    Sinus rhythm with brief NSVT, rare PVCs - Personally Reviewed  ECG    No new since 02/22/2021 - Personally Reviewed  Physical Exam   GEN: No acute distress. Bipap in place Neck: No JVD appreciated Cardiac: RRR, no murmurs, rubs, or gallops.  Respiratory: Bipap on, sounds largely clear GI: Soft, nontender, non-distended  MS: improving BL LE edema; No deformity. Neuro:  Nonfocal  Psych: Normal affect   Labs    High Sensitivity Troponin:   Recent Labs  Lab 03/03/2021 1812 02/05/2021 2012 03/02/21 1243 03/02/21 1259  TROPONINIHS 90* 87* 168* 157*      Chemistry Recent Labs  Lab 02/27/21 0213 02/28/21 0347 03/02/21 1259 03/02/21 1435 03/02/21 2218 03/03/21 0507  NA 142   < > 146* 145 141 141  K 3.9   < > 3.3* 4.1 4.4 4.6  CL 108   < > 108  --  107 107  CO2 27   < > 26  --  28 29  GLUCOSE 114*   < >  243*  --  197* 226*  BUN 24*   < > 24*  --  31* 35*  CREATININE 1.55*   < > 1.93*  --  2.10* 2.27*  CALCIUM 8.4*   < > 8.0*  --  8.1* 8.2*  PROT 5.9*  --  5.3*  --  5.4*  --   ALBUMIN 2.6*  --  2.2*  --  2.4*  --   AST 40  --  127*  --  120*  --   ALT 26  --  62*  --  58*  --   ALKPHOS 83  --  92  --  98  --   BILITOT 1.4*  --  0.9  --  0.9  --   GFRNONAA 43*   < > 33*  --  30* 27*  ANIONGAP 7   < > 12  --  6 5   < > = values in this interval not displayed.     Hematology Recent Labs  Lab 03/02/21 0410 03/02/21 1259 03/02/21 1435 03/03/21 0507  WBC 9.3 11.5*  --  12.6*  RBC 4.12* 4.19*  4.18*  --  3.72*  HGB 11.6* 11.8* 11.2* 10.2*  HCT 36.3* 39.5 33.0* 33.3*  MCV 88.1 94.3  --  89.5  MCH 28.2 28.2  --  27.4  MCHC 32.0 29.9*  --  30.6  RDW 18.7* 18.8*  --  18.3*  PLT 187 208  --  191    BNP Recent Labs  Lab 02/25/2021 1812  BNP 1,845.5*      DDimer  Recent Labs  Lab 03/02/21 1259  DDIMER 3.62*     Radiology    DG CHEST PORT 1 VIEW  Result Date: 03/03/2021 CLINICAL DATA:  Acute respiratory failure, hypoxia EXAM: PORTABLE CHEST 1 VIEW COMPARISON:  03/02/2021 FINDINGS: Endotracheal tube unchanged, 2.2 cm above the carina. Nasogastric tube extends into the upper abdomen. Right subclavian central venous catheter tip noted at the superior cavoatrial junction. Diffuse coarse pulmonary infiltrates are again identified and appear stable since prior examination. Pulmonary insufflation has slightly decreased in the interval. No pneumothorax. Small bilateral pleural effusions are again noted. Coronary artery bypass grafting has been performed. Cardiac size is mildly enlarged, unchanged. IMPRESSION: Stable support lines and tubes. Stable diffuse pulmonary infiltrates, possibly infectious or inflammatory. Stable small bilateral pleural effusions. Decreasing pulmonary insufflation. Electronically Signed   By: Fidela Salisbury MD   On: 03/03/2021 07:23   DG CHEST PORT 1 VIEW  Result Date: 03/02/2021 CLINICAL DATA:  Central line placement EXAM: PORTABLE CHEST 1 VIEW COMPARISON:  03/02/2021 at 1230 hours FINDINGS: Interval placement of right subclavian approach central venous catheter with distal tip terminating at the level of the distal SVC. Endotracheal tube terminates approximately 1.9 cm above the carina. Enteric tube courses below the diaphragm with distal tip beyond the inferior margin of the film. Post CABG changes. Multiple overlying cardiac leads. Cardiomegaly. Diffuse bilateral interstitial opacities. Bilateral pleural effusions. No evidence of a pneumothorax. IMPRESSION: 1. Interval placement of right subclavian approach central venous catheter with distal tip terminating at the level of the distal SVC. No evidence of pneumothorax. 2. Persistent bilateral pleural effusions and diffuse interstitial opacities. Electronically Signed   By:  Davina Poke D.O.   On: 03/02/2021 14:47   DG CHEST PORT 1 VIEW  Result Date: 03/02/2021 CLINICAL DATA:  Endotracheal tube EXAM: PORTABLE CHEST 1 VIEW COMPARISON:  03/02/2021, 6:16 a.m. FINDINGS: Interval placement of endotracheal tube, tip positioned over  the mid trachea. Interval placement of esophagogastric tube, tip and side port below the diaphragm. Interval increase in diffuse bilateral interstitial pulmonary opacity, with redemonstrated layering pleural effusions. Cardiomegaly status post median sternotomy and CABG. IMPRESSION: 1. Interval placement of endotracheal tube, tip positioned over the mid trachea. 2. Interval placement of esophagogastric tube, tip and side port below the diaphragm. 3. Interval increase in diffuse bilateral interstitial pulmonary opacity consistent with worsened edema, with redemonstrated layering pleural effusions. Electronically Signed   By: Eddie Candle M.D.   On: 03/02/2021 14:27   DG CHEST PORT 1 VIEW  Result Date: 03/02/2021 CLINICAL DATA:  Shortness of breath EXAM: PORTABLE CHEST 1 VIEW COMPARISON:  02/28/2021 and prior studies FINDINGS: Cardiomegaly, CABG changes, bilateral interstitial/airspace opacities and bilateral pleural effusions are again identified and unchanged. There is no evidence of pneumothorax. No acute bony abnormalities are identified. IMPRESSION: Unchanged appearance of the chest with bilateral interstitial/airspace opacities and bilateral pleural effusions. Electronically Signed   By: Margarette Canada M.D.   On: 03/02/2021 08:39   DG Abd Portable 1V  Result Date: 03/02/2021 CLINICAL DATA:  OG tube placement. EXAM: PORTABLE ABDOMEN - 1 VIEW COMPARISON:  Prior radiographs FINDINGS: An OG tube is noted with tip overlying the mid stomach. No dilated bowel loops are identified.  No other changes noted. IMPRESSION: OG tube with tip overlying the mid stomach. Electronically Signed   By: Margarette Canada M.D.   On: 03/02/2021 14:24    Cardiac Studies    TEE/DCCV 08/2020:  1. Left ventricular ejection fraction, by estimation, is 60 to 65%. The  left ventricle has normal function. There is moderate left ventricular  hypertrophy.  2. Right ventricular systolic function is moderately reduced. The right  ventricular size is mildly enlarged.  3. No left atrial/left atrial appendage thrombus was detected.  4. Right atrial size was mild to moderately dilated.  5. The mitral valve is grossly normal. Trivial mitral valve  regurgitation.  6. The aortic valve is tricuspid. Aortic valve regurgitation is not  visualized.  7. There is mild (Grade II) plaque.   Conclusion(s)/Recommendation(s): No LA/LAA thrombus identified. Successful  cardioversion performed with restoration of normal sinus rhythm.   Echocardiogram 07/20/2020:  1. Left ventricular ejection fraction, by estimation, is 60 to 65%. The  left ventricle has normal function. The left ventricle has no regional  wall motion abnormalities. There is moderate concentric left ventricular  hypertrophy. Left ventricular  diastolic parameters are consistent with Grade II diastolic dysfunction  (pseudonormalization). Elevated left atrial pressure. There is the  interventricular septum is flattened in systole and diastole, consistent  with right ventricular pressure and volume  overload.  2. Right ventricular systolic function is moderately reduced. The right  ventricular size is moderately enlarged. There is severely elevated  pulmonary artery systolic pressure. The estimated right ventricular  systolic pressure is 53.7 mmHg.  3. Right atrial size was mildly dilated.  4. The mitral valve is grossly normal. Mild mitral valve regurgitation.  No evidence of mitral stenosis.  5. Tricuspid valve regurgitation is mild to moderate.  6. The aortic valve is tricuspid. Aortic valve regurgitation is not  visualized. Mild aortic valve sclerosis is present, with no evidence of  aortic  valve stenosis.  7. The inferior vena cava is dilated in size with >50% respiratory  variability, suggesting right atrial pressure of 8 mmHg.   Comparison(s): Changes from prior study are noted. RVSP improved from  prior study.   Myoview stress test 01/05/2017:  The  left ventricular ejection fraction is moderately decreased (30-44%).  Nuclear stress EF: 44%.  There was no ST segment deviation noted during stress.  This is a low risk study.  Low risk stress nuclear study with normal perfusion and normal left ventricular regional and global systolic function. Findings suggest nonischemic cardiomyopathy, but cannot exclude "balanced ischemia" in this patient with history of CAD and CABG.  Patient Profile     87 y.o. male with a hx of chronic diastolic CHF, mixed pulmonary venous and arterial hypertension followed by Dr. Lake Bells, CAD s/p CABG 1996, CKD stage II with a hx of partial nephrectomy 2018, HTN, HLD, RBBB,OSA with CPAPand chronic LE wounds followed by [facility] wound care RNwho is being followed for the evaluation ofCHFat the request of Dr. Hal Hope.  Assessment & Plan    1.Acute on chronic diastolic CHF: -Presented witha monthhx of worsening SOB/LE edemafound to be markedly fluid volume overloaded on presentationwith aBNP [id number] with CHF -Last echocardiogram with LVEF at 60-65% with severe pulmonary mixed venous and arterial hypertension -HsT 90 -> 87, suspect demand ischemia -Creatinine up 1.5-> 1.9-> 2.1, -He is off diuretics, weight up 191 from 189 LBS post resuscitation yesterday -Lactic acid is up at 2.7, overall prognosis is poor -Currently on pressors that are managed by primary team -Chest x-ray shows significant bilateral pulmonary edema, possible aspiration pneumonia, he is on antibiotics are being controlled by primary team. -I would consider restarting Lasix 40 mg IV twice daily  2. Hypoxic respiratory failure/PEA arrest secondary to  aspiration yesterday: -The patient is intubated and sedated  -Management per IM   3. Paroxsymal atrial fibrillation: -Appears to be intermittently going in and out of AF with rate control -Previously underwent TEE/DCCV to NSR and has been maintained on reduced dose Eliquis given age and renal function. He is not on rate controlling medications due to hx of AF with slow ventricular response -Hb stable -CHA2DS2/VAS Stroke Risk Points= 6, currently on heparin drip  3. Mixed pulmonary venous and arterial hypertension: -Stable on last echocardiogram  -Followed by Dr. [place] Bells  4. CAD s/p CABG 1996: -No complaints of anginal symptoms -Continue current regimen with statin -No beta blocker with hx of bradycardia -No ASA in the setting of Eliquis, currently on heparin drip  5. CKD stage III: -Creatinine above baseline on presentation at 2.18, improved with diuresis -Continue to monitor renal function closely  -Follow with daily BMET   6. HTN: -On pressors post PEA arrest yesterday  7. HLD: -Last LDL,52 on 07/21/2020 -Continue statin, Zetia  8. DM2: -HbA1c,6.4 -SSI for glucose control per IM  9. Hypothyroidism: -TSH, 12.388 this admission>>previously 3.5 on 07/21/2020 -Needs synthroid adjustment per IM  For questions or updates, please contact [place] Please consult [email or web address] for contact info under      Signed, Ena Dawley, MD  03/03/2021, 12:50 PM

## 2021-03-03 NOTE — Consult Note (Signed)
NAME:  [name], MRN:  [id number], DOB:  1932/02/26, LOS: 5 ADMISSION DATE:  03/02/2021, CONSULTATION DATE: 3/27 REFERRING MD: Dr. Posey Pronto, CHIEF COMPLAINT:  SOB  History of Present Illness:  87 year old male who presented to [facility] ER on 3/23 with reports of shortness of breath.    The patient was admitted per [facility] with concerns for acute on chronic diastolic CHF and acute hypoxemic respiratory failure.  He was noted to be significantly volume overloaded on presentation with a BNP of 1845 and chest x-ray consistent with CHF. At baseline he wears 3 L O2 but was noted to require 15 L on admission.  He was placed on BiPAP for respiratory distress.  Per documentation there were episodes of desaturation into the 80s on 3/26 when removed from BiPAP. He was treated with IV diuresis, albumin and BiPAP support.  Work-up notable for mildly elevated troponin thought related to demand ischemia.  Cardiology was consulted.  He also had significantly elevated TSH at 12.388.  Repeat free T4 pending.   On 3/27 the BiPAP was removed so he could eat lunch.  He was placed on 15L salter. The patient reportedly had an aspiration event and then suffered a cardiopulmonary arrest.  He underwent 25 minutes of ACLS before ROSC.  The patient was intubated by anesthesia and transferred to ICU for further evaluation.   PCCM consulted for evaluation.  Pertinent  Medical History  AF -on Eliquis CAD s/p CABG HTN HLD COVID PNA - January 2022  Mixed pulmonary and venous arterial hypertension Chronic Hypoxic Respiratory Failure  COPD OSA DM2 Hypothyroidism CKD 3B - baseline sr cr ~ 1.5 History of renal mass, nephrectomy Anemia of chronic kidney disease  Significant Hospital Events: Including procedures, antibiotic start and stop dates in addition to other pertinent events   . 3/23 admit per [facility] with SOB thought related to decompensated CHF, hypoxic resp failure . 3/27 cardiac arrest, transferred to ICU, intubated. PCCM    . 3/27 EEG shows predominant medication effect.  . 3/37 central line placement Chand  Interim History / Subjective:   Remains on sedation for ventilator dyssynchrony. Weaning vasopressors.   Objective   Blood pressure 129/82, pulse 96, temperature 98.06 F (36.7 C), resp. rate 18, height 5\' 9"  (1.753 m), weight 86.7 kg, SpO2 100 %. CVP:  [0 mmHg-28 mmHg] 5 mmHg  Vent Mode: PSV;CPAP FiO2 (%):  [50 %-100 %] 50 % Set Rate:  [18 bmp] 18 bmp Vt Set:  [570 mL] 570 mL PEEP:  [5 cmH20-8 cmH20] 8 cmH20 Pressure Support:  [10 cmH20] 10 cmH20 Plateau Pressure:  [17 cmH20-22 cmH20] 22 cmH20   Intake/Output Summary (Last 24 hours) at 03/03/2021 0949 Last data filed at 03/03/2021 0900 Gross per 24 hour  Intake 2440.49 ml  Output 585 ml  Net 1855.49 ml   Filed Weights   03/02/21 0020 03/02/21 1300 03/03/21 0226  Weight: 85.9 kg 85.9 kg 86.7 kg    Examination: General: Chronically ill-appearing, elderly male lying in bed on vent in no acute distress HEENT: MM pink/moist ETT, anicteric Neuro: sedated. No spontaneous movement, minimal extension to pain.  CV: JVP to the angle of the jaw. HS normal. Hypertensive and JVP elevated.  PULM: prolonged expiratory phase, lungs bilaterally with distant wheeze, tolerating PSV GI: soft, bsx4 active  Extremities: warm/dry, BLE 2+ pitting edema with Una boots in place, BUE edema  Skin: no rashes or lesions on exposed skin\  I performed a POC echocardiogram which showed normal LV size and  systolic function. Severe diastolic dysfunction with restrictive pattern. Elevated LAP by E/e'. Severe PHT by RVSP. IVC dilated with estimated RAP 20.        Resolved Hospital Problem list     Assessment & Plan:   Critically ill due to Cardiac Arrest with Shock requiring titration of vasopressors.  - Suspect decompensated HF, aspiration also a consideration, but elevated filling pressures and progressive infiltrates on CXR Critically ill duet Acute on Chronic  Hypoxic and hypercarbic Respiratory Failure requiring mechanical ventilation COPD OSA Possible Aspiration PNA  PAF  HTN HLD CAD s/p CABG (1986) CKD IIIb Lactic Acidosis in setting of Cardiac Arrest  DM II  Hypothyroidism  At Risk Malnutrition   Plan:  - wean vasopressors off - diuresis - add fentanyl for more hemodynamically neutral sedation. Wean propofol. Reassess mental status on minimal sedation. Observe for improvement - continue antibiotics for 7 days. - tolerating PSV but will not tolerate extubation until pulmonary edema improves and mentation clears. May not be able to tolerate being off sedation until pulmonary edema improves.  - hold on starting nitrates or other HF therapies until off vasopressors entirely    Best practice (right click and "Reselect all SmartList Selections" daily)  Diet:  Tube Feed  Pain/Anxiety/Delirium protocol (if indicated): Yes (RASS goal -1) VAP protocol (if indicated): Yes DVT prophylaxis: Systemic AC GI prophylaxis: H2B Glucose control:  SSI No and Basal insulin Yes Central venous access:  Yes, and it is still needed Arterial line:  Yes, and it is still needed Foley:  N/A Mobility:  bed rest  PT consulted: N/A Last date of multidisciplinary goals of care discussion: Per Dr. Posey Pronto / Dr. Harrell Gave prior to transfer to ICU.  Code Status:  full code Disposition: ICU   Labs   CBC: Recent Labs  Lab 02/21/2021 1812 02/23/2021 2110 02/28/21 0347 03/01/21 0208 03/02/21 0410 03/02/21 1259 03/02/21 1435 03/03/21 0507  WBC 8.6   < > 7.6 8.2 9.3 11.5*  --  12.6*  NEUTROABS 6.0  --   --   --   --  7.0  --   --   HGB 13.3   < > 12.7* 12.3* 11.6* 11.8* 11.2* 10.2*  HCT 43.2   < > 39.0 39.3 36.3* 39.5 33.0* 33.3*  MCV 90.2   < > 86.1 87.7 88.1 94.3  --  89.5  PLT 231   < > 206 194 187 208  --  191   < > = values in this interval not displayed.    Basic Metabolic Panel: Recent Labs  Lab 03/01/21 0208 03/02/21 0410 03/02/21 1259  03/02/21 1435 03/02/21 1522 03/02/21 2218 03/03/21 0507  NA 144 144 146* 145  --  141 141  K 4.0 3.9 3.3* 4.1  --  4.4 4.6  CL 107 107 108  --   --  107 107  CO2 31 31 26   --   --  28 29  GLUCOSE 154* 118* 243*  --   --  197* 226*  BUN 23 23 24*  --   --  31* 35*  CREATININE 1.55* 1.50* 1.93*  --   --  2.10* 2.27*  CALCIUM 8.6* 8.5* 8.0*  --   --  8.1* 8.2*  MG 2.3 2.1 2.6*  --  2.1  --  2.1  PHOS  --   --   --   --  4.4  --  4.4   GFR: Estimated Creatinine Clearance: 25.5 mL/min (A) (by C-G formula  based on SCr of 2.27 mg/dL (H)). Recent Labs  Lab 03/01/21 0208 03/02/21 0410 03/02/21 1235 03/02/21 1259 03/02/21 1522 03/03/21 0507  WBC 8.2 9.3  --  11.5*  --  12.6*  LATICACIDVEN  --   --  9.4*  --  2.7*  --     Liver Function Tests: Recent Labs  Lab 02/27/21 0213 03/02/21 1259 03/02/21 2218  AST 40 127* 120*  ALT 26 62* 58*  ALKPHOS 83 92 98  BILITOT 1.4* 0.9 0.9  PROT 5.9* 5.3* 5.4*  ALBUMIN 2.6* 2.2* 2.4*   No results for input(s): LIPASE, AMYLASE in the last 168 hours. No results for input(s): AMMONIA in the last 168 hours.  ABG    Component Value Date/Time   PHART 7.357 03/02/2021 1435   PCO2ART 51.3 (H) 03/02/2021 1435   PO2ART 71 (L) 03/02/2021 1435   HCO3 28.8 (H) 03/02/2021 1435   TCO2 30 03/02/2021 1435   ACIDBASEDEF 4.5 (H) 02/19/2017 2054   O2SAT 62.2 03/02/2021 1640     Coagulation Profile: Recent Labs  Lab 03/02/21 1259  INR 1.7*    Cardiac Enzymes: No results for input(s): CKTOTAL, CKMB, CKMBINDEX, TROPONINI in the last 168 hours.  HbA1C: Hgb A1c MFr Bld  Date/Time Value Ref Range Status  03/02/2021 03:22 PM 5.5 4.8 - 5.6 % Final    Comment:    (NOTE) Pre diabetes:          5.7%-6.4%  Diabetes:              >6.4%  Glycemic control for   <7.0% adults with diabetes   07/20/2020 04:24 PM 6.4 (H) 4.8 - 5.6 % Final    Comment:    (NOTE) Pre diabetes:          5.7%-6.4%  Diabetes:              >6.4%  Glycemic control for    <7.0% adults with diabetes     CBG: Recent Labs  Lab 03/02/21 1538 03/02/21 2031 03/02/21 2355 03/03/21 0255 03/03/21 0834  GLUCAP 168* 194* 177* 223* 180*   CRITICAL CARE Performed by: Kipp Brood   Total critical care time: 45 minutes  Critical care time was exclusive of separately billable procedures and treating other patients.  Critical care was necessary to treat or prevent imminent or life-threatening deterioration.  Critical care was time spent personally by me on the following activities: development of treatment plan with patient and/or surrogate as well as nursing, discussions with consultants, evaluation of patient's response to treatment, examination of patient, obtaining history from patient or surrogate, ordering and performing treatments and interventions, ordering and review of laboratory studies, ordering and review of radiographic studies, pulse oximetry, re-evaluation of patient's condition and participation in multidisciplinary rounds.  Kipp Brood, MD [facility] ICU Physician [place]  Pager: [phone] Mobile: [phone] After hours: [phone].

## 2021-03-03 NOTE — Progress Notes (Signed)
ANTICOAGULATION CONSULT NOTE  Pharmacy Consult for Heparin Indication: atrial fibrillation  No Known Allergies  Patient Measurements: Height: 5\' 9"  (175.3 cm) Weight: 86.7 kg (191 lb 2.2 oz) IBW/kg (Calculated) : 70.7 Heparin Dosing Weight: 85.9 kg  Vital Signs: Temp: 99.14 F (37.3 C) (03/28 1200) Temp Source: Bladder (03/28 0400) BP: 128/59 (03/28 1511) Pulse Rate: 75 (03/28 1511)  Labs: Recent Labs    03/02/21 0410 03/02/21 1243 03/02/21 1259 03/02/21 1435 03/02/21 2218 03/03/21 0507 03/03/21 1143 03/03/21 1257 03/03/21 1418  HGB 11.6*  --  11.8*   < >  --  10.2*  --  11.2* 10.2*  HCT 36.3*  --  39.5   < >  --  33.3*  --  33.0* 30.0*  PLT 187  --  208  --   --  191  --   --   --   APTT  --   --   --   --   --  87* 132*  --   --   LABPROT  --   --  19.4*  --   --   --   --   --   --   INR  --   --  1.7*  --   --   --   --   --   --   HEPARINUNFRC  --   --   --   --   --  1.46*  --   --   --   CREATININE 1.50*  --  1.93*  --  2.10* 2.27*  --   --   --   TROPONINIHS  --  168* 157*  --   --   --   --   --   --    < > = values in this interval not displayed.    Estimated Creatinine Clearance: 25.5 mL/min (A) (by C-G formula based on SCr of 2.27 mg/dL (H)).  Assessment: 87 y.o. male with h/o Afib, Eliquis on hold. Pharmacy consulted for IV heparin dosing.  8-hr f/u aPTT level supratherapeutic at 132. CBC stable, slight oozing with dressing changes noted per RN.   Goal of Therapy:  Heparin level 0.3-0.7 units/ml Monitor platelets by anticoagulation protocol: Yes   Plan:  Decrease IV heparin gtt to 1,050 units/hr  Follow up with 8-hr aPTT level Monitor daily CBC, s/sx bleeding  Mercy Riding, PharmD PGY1 Acute Care Pharmacy Resident Please refer to [facility] for unit-specific pharmacist

## 2021-03-03 NOTE — Progress Notes (Signed)
PT Cancellation Note  Patient Details Name: [name] MRN: [id number] DOB: 12-09-1931   Cancelled Treatment:    Reason Eval/Treat Not Completed: Patient not medically ready (pt with code and intubation 3/27. Current FiO2 60% and not medically appropriate. Will sign off and await new order, discussed with RN)   [name] 03/03/2021, 8:56 AM  [place] Pager: [phone] Office: [phone]

## 2021-03-03 NOTE — Plan of Care (Signed)
  Problem: Clinical Measurements: Goal: Will remain free from infection Outcome: Progressing Goal: Diagnostic test results will improve Outcome: Progressing Goal: Cardiovascular complication will be avoided Outcome: Progressing   Problem: Activity: Goal: Risk for activity intolerance will decrease Outcome: Progressing   Problem: Nutrition: Goal: Adequate nutrition will be maintained Outcome: Progressing   Problem: Coping: Goal: Level of anxiety will decrease Outcome: Progressing   Problem: Elimination: Goal: Will not experience complications related to bowel motility Outcome: Progressing   Problem: Pain Managment: Goal: General experience of comfort will improve Outcome: Progressing   Problem: Safety: Goal: Ability to remain free from injury will improve Outcome: Progressing   Problem: Skin Integrity: Goal: Risk for impaired skin integrity will decrease Outcome: Progressing   Problem: Education: Goal: Ability to demonstrate management of disease process will improve Outcome: Not Progressing Goal: Ability to verbalize understanding of medication therapies will improve Outcome: Not Progressing   Problem: Activity: Goal: Capacity to carry out activities will improve Outcome: Not Progressing   Problem: Cardiac: Goal: Ability to achieve and maintain adequate cardiopulmonary perfusion will improve Outcome: Not Progressing   Problem: Education: Goal: Knowledge of General Education information will improve Description: Including pain rating scale, medication(s)/side effects and non-pharmacologic comfort measures Outcome: Not Progressing   Problem: Health Behavior/Discharge Planning: Goal: Ability to manage health-related needs will improve Outcome: Not Progressing   Problem: Clinical Measurements: Goal: Ability to maintain clinical measurements within normal limits will improve Outcome: Not Progressing Goal: Respiratory complications will improve Outcome: Not  Progressing   Problem: Elimination: Goal: Will not experience complications related to urinary retention Outcome: Not Progressing

## 2021-03-03 NOTE — Progress Notes (Signed)
Initial Nutrition Assessment  DOCUMENTATION CODES:   Non-severe (moderate) malnutrition in context of chronic illness  INTERVENTION:   Tube feeding:  -Vital 1.5 @ 55 ml/hr via OG (1320 ml) -ProSource 45 ml QID  Provides: 2140 kcals, 133 grams protein, 1008 ml free water.   Add b complex with Vitamin C if started on CRRT  NUTRITION DIAGNOSIS:   Moderate Malnutrition related to chronic illness (COPD/CHF) as evidenced by mild fat depletion,moderate muscle depletion.  GOAL:   Patient will meet greater than or equal to 90% of their needs  MONITOR:   Vent status,Labs,I & O's,Skin,TF tolerance,Weight trends  REASON FOR ASSESSMENT:   Consult,Ventilator Enteral/tube feeding initiation and management  ASSESSMENT:   Patient with PMH significant for CAD s/p CABG, HTN, HLD, COVID PNA (12/2020), pulmonary hypertension, COPD, DM, CKD III, renal mass s/p nephrectomy. Presents this admission suspected decompensated HF.   3/27- cardiopulmonary arrest, intubated   Pt discussed during ICU rounds and with RN.   Weaning pressors. Weaning propofol. Unable to extubate until pulmonary edema improves. Diuresing but may require CRRT in the next few days. OG side port located at the top of the stomach, may benefit from slight advancement. Patient tolerating Vital High Protein at 40 ml/hr. Change formula to better meet needs.   Weight shows fluctuation from 78-90 kg over the last 6 months. Utilize weight of 85.9 kg as EDW for now, though suspect dry weight is even lower.   Patient is currently intubated on ventilator support MV: 11.6 L/min Temp (24hrs), Avg:99 F (37.2 C), Min:98.06 F (36.7 C), Max:99.86 F (37.7 C)  Propofol: 7.7 ml/hr- provides 203 kcal from lipids daily   UOP: 435 ml x 24 hrs   Drips: levophed, vasopressin, propofol Medications: colace, SS novolog, miralax Labs: Cr 2.27- up from yesterday CBG 149-223  NUTRITION - FOCUSED PHYSICAL EXAM:  Flowsheet Row Most Recent  Value  Orbital Region Mild depletion  Upper Arm Region Moderate depletion  Thoracic and Lumbar Region Unable to assess  Buccal Region Unable to assess  Temple Region Moderate depletion  Clavicle Bone Region Moderate depletion  Clavicle and Acromion Bone Region Severe depletion  Scapular Bone Region Unable to assess  Dorsal Hand Mild depletion  Patellar Region Moderate depletion  Anterior Thigh Region Moderate depletion  Posterior Calf Region Unable to assess  Edema (RD Assessment) Mild  [generalized]  Hair Reviewed  Eyes Unable to assess  Mouth Unable to assess  Skin Reviewed  Nails Reviewed     Diet Order:   Diet Order            Diet NPO time specified  Diet effective now                 EDUCATION NEEDS:   Not appropriate for education at this time  Skin:  Skin Assessment: Reviewed RN Assessment  Last BM:  3/26  Height:   Ht Readings from Last 1 Encounters:  03/02/21 5\' 9"  (1.753 m)    Weight:   Wt Readings from Last 1 Encounters:  03/03/21 86.7 kg    BMI:  Body mass index is 28.23 kg/m.  Estimated Nutritional Needs:   Kcal:  2100-2400 kcal  Protein:  130-145 grams  Fluid:  >/= 2 L/day  Mariana Single RD, LDN Clinical Nutrition Pager listed in [place]

## 2021-03-04 ENCOUNTER — Ambulatory Visit: Payer: Self-pay | Admitting: *Deleted

## 2021-03-04 DIAGNOSIS — R9431 Abnormal electrocardiogram [ECG] [EKG]: Secondary | ICD-10-CM

## 2021-03-04 DIAGNOSIS — I10 Essential (primary) hypertension: Secondary | ICD-10-CM | POA: Diagnosis not present

## 2021-03-04 DIAGNOSIS — J9601 Acute respiratory failure with hypoxia: Secondary | ICD-10-CM | POA: Diagnosis not present

## 2021-03-04 DIAGNOSIS — I4891 Unspecified atrial fibrillation: Secondary | ICD-10-CM | POA: Diagnosis not present

## 2021-03-04 DIAGNOSIS — I5033 Acute on chronic diastolic (congestive) heart failure: Secondary | ICD-10-CM | POA: Diagnosis not present

## 2021-03-04 LAB — BASIC METABOLIC PANEL
Anion gap: 6 (ref 5–15)
Anion gap: 8 (ref 5–15)
BUN: 48 mg/dL — ABNORMAL HIGH (ref 8–23)
BUN: 55 mg/dL — ABNORMAL HIGH (ref 8–23)
CO2: 29 mmol/L (ref 22–32)
CO2: 28 mmol/L (ref 22–32)
Calcium: 8 mg/dL — ABNORMAL LOW (ref 8.9–10.3)
Calcium: 7.8 mg/dL — ABNORMAL LOW (ref 8.9–10.3)
Chloride: 106 mmol/L (ref 98–111)
Chloride: 104 mmol/L (ref 98–111)
Creatinine, Ser: 2.84 mg/dL — ABNORMAL HIGH (ref 0.61–1.24)
Creatinine, Ser: 3.08 mg/dL — ABNORMAL HIGH (ref 0.61–1.24)
GFR, Estimated: 21 mL/min — ABNORMAL LOW (ref >60)
GFR, Estimated: 19 mL/min — ABNORMAL LOW (ref >60)
Glucose, Bld: 168 mg/dL — ABNORMAL HIGH (ref 70–99)
Glucose, Bld: 210 mg/dL — ABNORMAL HIGH (ref 70–99)
Potassium: 4.7 mmol/L (ref 3.5–5.1)
Potassium: 5.3 mmol/L — ABNORMAL HIGH (ref 3.5–5.1)
Sodium: 141 mmol/L (ref 135–145)
Sodium: 140 mmol/L (ref 135–145)

## 2021-03-04 LAB — CBC
HCT: 31.8 % — ABNORMAL LOW (ref 39.0–52.0)
HCT: 29.6 % — ABNORMAL LOW (ref 39.0–52.0)
Hemoglobin: 9.5 g/dL — ABNORMAL LOW (ref 13.0–17.0)
Hemoglobin: 9 g/dL — ABNORMAL LOW (ref 13.0–17.0)
MCH: 27.3 pg (ref 26.0–34.0)
MCH: 28.3 pg (ref 26.0–34.0)
MCHC: 29.9 g/dL — ABNORMAL LOW (ref 30.0–36.0)
MCHC: 30.4 g/dL (ref 30.0–36.0)
MCV: 91.4 fL (ref 80.0–100.0)
MCV: 93.1 fL (ref 80.0–100.0)
Platelets: 178 10*3/uL (ref 150–400)
Platelets: 182 10*3/uL (ref 150–400)
RBC: 3.48 MIL/uL — ABNORMAL LOW (ref 4.22–5.81)
RBC: 3.18 MIL/uL — ABNORMAL LOW (ref 4.22–5.81)
RDW: 18.3 % — ABNORMAL HIGH (ref 11.5–15.5)
RDW: 18.4 % — ABNORMAL HIGH (ref 11.5–15.5)
WBC: 13.3 10*3/uL — ABNORMAL HIGH (ref 4.0–10.5)
WBC: 13.9 10*3/uL — ABNORMAL HIGH (ref 4.0–10.5)
nRBC: 0 % (ref 0.0–0.2)
nRBC: 0 % (ref 0.0–0.2)

## 2021-03-04 LAB — CULTURE, RESPIRATORY W GRAM STAIN: Culture: NORMAL

## 2021-03-04 LAB — GLUCOSE, CAPILLARY
Glucose-Capillary: 115 mg/dL — ABNORMAL HIGH (ref 70–99)
Glucose-Capillary: 147 mg/dL — ABNORMAL HIGH (ref 70–99)
Glucose-Capillary: 159 mg/dL — ABNORMAL HIGH (ref 70–99)
Glucose-Capillary: 183 mg/dL — ABNORMAL HIGH (ref 70–99)
Glucose-Capillary: 183 mg/dL — ABNORMAL HIGH (ref 70–99)

## 2021-03-04 LAB — POCT I-STAT 7, (LYTES, BLD GAS, ICA,H+H)
Acid-Base Excess: 1 mmol/L (ref 0.0–2.0)
Bicarbonate: 29.2 mmol/L — ABNORMAL HIGH (ref 20.0–28.0)
Calcium, Ion: 1.18 mmol/L (ref 1.15–1.40)
HCT: 27 % — ABNORMAL LOW (ref 39.0–52.0)
Hemoglobin: 9.2 g/dL — ABNORMAL LOW (ref 13.0–17.0)
O2 Saturation: 97 %
Patient temperature: 37.9
Potassium: 5.3 mmol/L — ABNORMAL HIGH (ref 3.5–5.1)
Sodium: 140 mmol/L (ref 135–145)
TCO2: 31 mmol/L (ref 22–32)
pCO2 arterial: 69 mmHg (ref 32.0–48.0)
pH, Arterial: 7.239 — ABNORMAL LOW (ref 7.350–7.450)
pO2, Arterial: 119 mmHg — ABNORMAL HIGH (ref 83.0–108.0)

## 2021-03-04 LAB — HEPARIN LEVEL (UNFRACTIONATED)
Heparin Unfractionated: 1.09 IU/mL — ABNORMAL HIGH (ref 0.30–0.70)
Heparin Unfractionated: 0.76 IU/mL — ABNORMAL HIGH (ref 0.30–0.70)

## 2021-03-04 LAB — MAGNESIUM
Magnesium: 2.2 mg/dL (ref 1.7–2.4)
Magnesium: 2.2 mg/dL (ref 1.7–2.4)

## 2021-03-04 LAB — APTT
aPTT: 122 seconds — ABNORMAL HIGH (ref 24–36)
aPTT: 59 seconds — ABNORMAL HIGH (ref 24–36)

## 2021-03-04 MED ORDER — GLYCOPYRROLATE 1 MG PO TABS
1.0000 mg | ORAL_TABLET | ORAL | Status: DC | PRN
  Filled 2021-03-04: qty 1

## 2021-03-04 MED ORDER — ACETAMINOPHEN 325 MG PO TABS: 650.0000 mg | ORAL_TABLET | Freq: Four times a day (QID) | ORAL | Status: DC | PRN

## 2021-03-04 MED ORDER — METOLAZONE 5 MG PO TABS
5.0000 mg | ORAL_TABLET | Freq: Once | ORAL | Status: AC
  Administered 2021-03-04: 5 mg via ORAL
  Filled 2021-03-04: qty 1

## 2021-03-04 MED ORDER — CHLORHEXIDINE GLUCONATE 0.12% ORAL RINSE (MEDLINE KIT): 15.0000 mL | Freq: Two times a day (BID) | OROMUCOSAL | Status: DC

## 2021-03-04 MED ORDER — POLYVINYL ALCOHOL 1.4 % OP SOLN
1.0000 [drp] | Freq: Four times a day (QID) | OPHTHALMIC | Status: DC | PRN
  Filled 2021-03-04: qty 15

## 2021-03-04 MED ORDER — NOREPINEPHRINE 16 MG/250ML-% IV SOLN
0.0000 ug/min | INTRAVENOUS | Status: DC
  Administered 2021-03-04: 6 ug/min via INTRAVENOUS
  Filled 2021-03-04: qty 250

## 2021-03-04 MED ORDER — CHLORHEXIDINE GLUCONATE CLOTH 2 % EX PADS: 6.0000 | MEDICATED_PAD | Freq: Every day | CUTANEOUS | Status: DC

## 2021-03-04 MED ORDER — GLYCOPYRROLATE 0.2 MG/ML IJ SOLN: 0.2000 mg | INTRAMUSCULAR | Status: DC | PRN

## 2021-03-04 MED ORDER — DIPHENHYDRAMINE HCL 50 MG/ML IJ SOLN: 25.0000 mg | INTRAMUSCULAR | Status: DC | PRN

## 2021-03-04 MED ORDER — ORAL CARE MOUTH RINSE
15.0000 mL | OROMUCOSAL | Status: DC
  Administered 2021-03-04 (×4): 15 mL via OROMUCOSAL

## 2021-03-04 MED ORDER — FUROSEMIDE 10 MG/ML IJ SOLN
INTRAMUSCULAR | Status: AC
  Filled 2021-03-04: qty 8

## 2021-03-04 MED ORDER — DEXTROSE 5 % IV SOLN: INTRAVENOUS | Status: DC

## 2021-03-04 MED ORDER — GLYCOPYRROLATE 0.2 MG/ML IJ SOLN
0.2000 mg | INTRAMUSCULAR | Status: DC | PRN
  Administered 2021-03-04: 0.2 mg via INTRAVENOUS
  Filled 2021-03-04: qty 1

## 2021-03-04 MED ORDER — ACETAMINOPHEN 650 MG RE SUPP: 650.0000 mg | Freq: Four times a day (QID) | RECTAL | Status: DC | PRN

## 2021-03-05 ENCOUNTER — Other Ambulatory Visit: Payer: Self-pay | Admitting: *Deleted

## 2021-03-05 NOTE — Patient Outreach (Signed)
[name] [facility]) Care Management  02/15/2021  [name] Dec 15, 1931 [id number]   Case Closure   Received in basket message from [place], patient is deceased.    Plan Will close case to [facility] care management . Will send PCP case closure letter .    Joylene Draft, RN, BSN  [place] Management Coordinator  [phone]- Mobile [phone]- Toll Free Main Office

## 2021-03-07 NOTE — Progress Notes (Signed)
[place] for Heparin Indication: atrial fibrillation  No Known Allergies  Patient Measurements: Height: 5\' 9"  (175.3 cm) Weight: 90 kg (198 lb 6.6 oz) IBW/kg (Calculated) : 70.7 Heparin Dosing Weight: 85.9 kg  Vital Signs: Temp: 101.12 F (38.4 C) (03/29 1000) Temp Source: Bladder (03/29 0800) BP: 119/62 (03/29 1000) Pulse Rate: 100 (03/29 1000)  Labs: Recent Labs    03/02/21 1243 03/02/21 1259 03/02/21 1435 03/02/21 2218 03/03/21 0507 03/03/21 1143 03/03/21 1257 03/03/21 1418 2021-03-19 0040 03/19/21 0053 03/19/2021 0314 2021-03-19 1005  HGB  --  11.8*   < >  --  10.2*  --  11.2* 10.2*  --   --  9.5*  --   HCT  --  39.5   < >  --  33.3*  --  33.0* 30.0*  --   --  31.8*  --   PLT  --  208  --   --  191  --   --   --   --   --  178  --   APTT  --   --    < >  --  87* 132*  --   --   --  122*  --  59*  LABPROT  --  19.4*  --   --   --   --   --   --   --   --   --   --   INR  --  1.7*  --   --   --   --   --   --   --   --   --   --   HEPARINUNFRC  --   --   --   --  1.46*  --   --   --  1.09*  --   --  0.76*  CREATININE  --  1.93*  --  2.10* 2.27*  --   --   --   --   --  2.84*  --   TROPONINIHS 168* 157*  --   --   --   --   --   --   --   --   --   --    < > = values in this interval not displayed.    Estimated Creatinine Clearance: 20.7 mL/min (A) (by C-G formula based on SCr of 2.84 mg/dL (H)).  Assessment: 87 y.o. male with h/o Afib, Eliquis on hold. Pharmacy consulted for IV heparin dosing.  Heparin level still slightly altered by DOAC use, aPTT is now subtherapeutic after dose adjustments.  Goal of Therapy:  Heparin level 0.3-0.7 units/ml Monitor platelets by anticoagulation protocol: Yes   Plan:  Increase heparin to 900 units/h Recheck aPTT and heparin level with am labs   Arrie Senate, PharmD, BCPS, [facility] Clinical Pharmacist [phone] Please check AMION for all [place] numbers 2021-03-19

## 2021-03-07 NOTE — Progress Notes (Signed)
Progress Note  Patient Name: [name] Date of Encounter: Mar 19, 2021  [facility] Cardiologist: Pixie Casino, MD   Subjective   The patient remains intubated and sedated, we were called for change in rhythm in EKG pattern suspicious for STEMI.  Inpatient Medications    Scheduled Meds: . arformoterol  15 mcg Nebulization BID  . budesonide (PULMICORT) nebulizer solution  0.5 mg Nebulization BID  . chlorhexidine gluconate (MEDLINE KIT)  15 mL Mouth Rinse BID  . [START ON 02/23/2021] Chlorhexidine Gluconate Cloth  6 each Topical Daily  . docusate  100 mg Per Tube BID  . ezetimibe  10 mg Per Tube Daily  . feeding supplement (PROSource TF)  45 mL Per Tube QID  . insulin aspart  0-9 Units Subcutaneous Q4H  . levothyroxine  112 mcg Per Tube QAC breakfast  . mouth rinse  15 mL Mouth Rinse 10 times per day  . polyethylene glycol  17 g Per Tube Daily  . rosuvastatin  20 mg Per Tube Daily  . sodium chloride flush  10-40 mL Intracatheter Q12H  . Thrombi-Pad  1 each Topical Once   Continuous Infusions: . sodium chloride Stopped (03/03/21 0117)  . cefTRIAXone (ROCEPHIN)  IV Stopped (03/03/21 1656)  . famotidine (PEPCID) IV Stopped (03/03/21 2201)  . feeding supplement (VITAL 1.5 CAL) 1,000 mL (2021-03-19 1156)  . fentaNYL infusion INTRAVENOUS 50 mcg/hr (03/19/2021 1500)  . furosemide (LASIX) 200 mg in dextrose 5% 100 mL (67m/mL) infusion 12 mg/hr (004/13/221500)  . heparin 900 Units/hr (004/13/20221500)  . midazolam 2 mg/hr (004-13-20221500)  . norepinephrine (LEVOPHED) Adult infusion 6 mcg/min (0April 13, 20221500)  . vasopressin 0.03 Units/min (004-13-20221500)   PRN Meds: acetaminophen **OR** acetaminophen, dextrose, fentaNYL, hydrALAZINE, midazolam, polyvinyl alcohol   Vital Signs    Vitals:   0Apr 13, 20221141 013-Apr-20221200 004/13/20221300 004/13/221400  BP: (!) 131/50 115/62 107/60 (!) 96/55  Pulse: 99  99   Resp: (!) 22 (!) 22 (!) 22 (!) 22  Temp:  (!) 100.4 F (38 C) 100.22 F  (37.9 C) 99.5 F (37.5 C)  TempSrc:  Bladder    SpO2: 93% 100% 100%   Weight:      Height:        Intake/Output Summary (Last 24 hours) at 32022/04/131505 Last data filed at 313-Apr-20221500 Gross per 24 hour  Intake 3065.6 ml  Output 418 ml  Net 2647.6 ml   Last 3 Weights 304/13/223/28/2022 03/02/2021  Weight (lbs) 198 lb 6.6 oz 191 lb 2.2 oz 189 lb 6 oz  Weight (kg) 90 kg 86.7 kg 85.9 kg      Telemetry    Sinus rhythm with brief NSVT, rare PVCs - Personally Reviewed  ECG    No new since 02/10/2021 - Personally Reviewed  Physical Exam   GEN: No acute distress. Bipap in place Neck: No JVD appreciated Cardiac: RRR, no murmurs, rubs, or gallops.  Respiratory: Bipap on, sounds largely clear GI: Soft, nontender, non-distended  MS: improving BL LE edema; No deformity. Neuro:  Nonfocal  Psych: Normal affect   Labs    High Sensitivity Troponin:   Recent Labs  Lab 02/21/2021 1812 03/06/2021 2012 03/02/21 1243 03/02/21 1259  TROPONINIHS 90* 87* 168* 157*      Chemistry Recent Labs  Lab 02/27/21 0213 02/28/21 0347 03/02/21 1259 03/02/21 1435 03/02/21 2218 03/03/21 0507 03/03/21 1257 004/13/20220314 004/13/20221300 004/13/20221306  NA 142   < > 146*   < >  141 141   < > 141 140 140  K 3.9   < > 3.3*   < > 4.4 4.6   < > 4.7 5.3* 5.3*  CL 108   < > 108  --  107 107  --  106 104  --   CO2 27   < > 26  --  28 29  --  29 28  --   GLUCOSE 114*   < > 243*  --  197* 226*  --  168* 210*  --   BUN 24*   < > 24*  --  31* 35*  --  48* 55*  --   CREATININE 1.55*   < > 1.93*  --  2.10* 2.27*  --  2.84* 3.08*  --   CALCIUM 8.4*   < > 8.0*  --  8.1* 8.2*  --  8.0* 7.8*  --   PROT 5.9*  --  5.3*  --  5.4*  --   --   --   --   --   ALBUMIN 2.6*  --  2.2*  --  2.4*  --   --   --   --   --   AST 40  --  127*  --  120*  --   --   --   --   --   ALT 26  --  62*  --  58*  --   --   --   --   --   ALKPHOS 83  --  92  --  98  --   --   --   --   --   BILITOT 1.4*  --  0.9  --  0.9  --   --    --   --   --   GFRNONAA 43*   < > 33*  --  30* 27*  --  21* 19*  --   ANIONGAP 7   < > 12  --  6 5  --  6 8  --    < > = values in this interval not displayed.     Hematology Recent Labs  Lab 03/03/21 0507 03/03/21 1257 03/07/21 0314 03/07/2021 1300 03-07-2021 1306  WBC 12.6*  --  13.3* 13.9*  --   RBC 3.72*  --  3.48* 3.18*  --   HGB 10.2*   < > 9.5* 9.0* 9.2*  HCT 33.3*   < > 31.8* 29.6* 27.0*  MCV 89.5  --  91.4 93.1  --   MCH 27.4  --  27.3 28.3  --   MCHC 30.6  --  29.9* 30.4  --   RDW 18.3*  --  18.3* 18.4*  --   PLT 191  --  178 182  --    < > = values in this interval not displayed.   BNP Recent Labs  Lab 03/03/2021 1812  BNP 1,845.5*     DDimer  Recent Labs  Lab 03/02/21 1259  DDIMER 3.62*     Radiology    DG Chest 1 View  Result Date: 03/03/2021 CLINICAL DATA:  87 year old male with increased oxygen requirement. EXAM: CHEST  1 VIEW COMPARISON:  Earlier the same day FINDINGS: Defibrillator pad overlies left chest to mediastinum. The heart is moderately enlarged, unchanged. Postsurgical changes after coronary artery bypass graft. An endotracheal tube is in place with the distal tip terminating approximately 2 cm above the carina. Right subclavian approach central venous catheter  in place with the tip in the superior vena cava. Blunting of the costophrenic angles bilaterally, similar to comparison. Diffuse patchy pulmonary opacities, basal predominant, improved from comparison. No pneumothorax. No acute osseous abnormality. IMPRESSION: Improved aeration of the lungs bilaterally with mild persistent bibasilar predominant pulmonary edema and associated trace bilateral pleural effusions. Electronically Signed   By: Ruthann Cancer MD   On: 03/03/2021 13:22   DG CHEST PORT 1 VIEW  Result Date: 03/03/2021 CLINICAL DATA:  Acute respiratory failure, hypoxia EXAM: PORTABLE CHEST 1 VIEW COMPARISON:  03/02/2021 FINDINGS: Endotracheal tube unchanged, 2.2 cm above the carina.  Nasogastric tube extends into the upper abdomen. Right subclavian central venous catheter tip noted at the superior cavoatrial junction. Diffuse coarse pulmonary infiltrates are again identified and appear stable since prior examination. Pulmonary insufflation has slightly decreased in the interval. No pneumothorax. Small bilateral pleural effusions are again noted. Coronary artery bypass grafting has been performed. Cardiac size is mildly enlarged, unchanged. IMPRESSION: Stable support lines and tubes. Stable diffuse pulmonary infiltrates, possibly infectious or inflammatory. Stable small bilateral pleural effusions. Decreasing pulmonary insufflation. Electronically Signed   By: Fidela Salisbury MD   On: 03/03/2021 07:23    Cardiac Studies   TEE/DCCV 08/2020:  1. Left ventricular ejection fraction, by estimation, is 60 to 65%. The  left ventricle has normal function. There is moderate left ventricular  hypertrophy.  2. Right ventricular systolic function is moderately reduced. The right  ventricular size is mildly enlarged.  3. No left atrial/left atrial appendage thrombus was detected.  4. Right atrial size was mild to moderately dilated.  5. The mitral valve is grossly normal. Trivial mitral valve  regurgitation.  6. The aortic valve is tricuspid. Aortic valve regurgitation is not  visualized.  7. There is mild (Grade II) plaque.   Conclusion(s)/Recommendation(s): No LA/LAA thrombus identified. Successful  cardioversion performed with restoration of normal sinus rhythm.   Echocardiogram 07/20/2020:  1. Left ventricular ejection fraction, by estimation, is 60 to 65%. The  left ventricle has normal function. The left ventricle has no regional  wall motion abnormalities. There is moderate concentric left ventricular  hypertrophy. Left ventricular  diastolic parameters are consistent with Grade II diastolic dysfunction  (pseudonormalization). Elevated left atrial pressure. There is  the  interventricular septum is flattened in systole and diastole, consistent  with right ventricular pressure and volume  overload.  2. Right ventricular systolic function is moderately reduced. The right  ventricular size is moderately enlarged. There is severely elevated  pulmonary artery systolic pressure. The estimated right ventricular  systolic pressure is 47.6 mmHg.  3. Right atrial size was mildly dilated.  4. The mitral valve is grossly normal. Mild mitral valve regurgitation.  No evidence of mitral stenosis.  5. Tricuspid valve regurgitation is mild to moderate.  6. The aortic valve is tricuspid. Aortic valve regurgitation is not  visualized. Mild aortic valve sclerosis is present, with no evidence of  aortic valve stenosis.  7. The inferior vena cava is dilated in size with >50% respiratory  variability, suggesting right atrial pressure of 8 mmHg.   Comparison(s): Changes from prior study are noted. RVSP improved from  prior study.   Myoview stress test 01/05/2017:  The left ventricular ejection fraction is moderately decreased (30-44%).  Nuclear stress EF: 44%.  There was no ST segment deviation noted during stress.  This is a low risk study.  Low risk stress nuclear study with normal perfusion and normal left ventricular regional and global systolic function.  Findings suggest nonischemic cardiomyopathy, but cannot exclude "balanced ischemia" in this patient with history of CAD and CABG.  Patient Profile     87 y.o. male with a hx of chronic diastolic CHF, mixed pulmonary venous and arterial hypertension followed by Dr. Lake Bells, CAD s/p CABG 1996, CKD stage II with a hx of partial nephrectomy 2018, HTN, HLD, RBBB,OSA with CPAPand chronic LE wounds [facility] care RNwho is being followed for the evaluation ofCHFat the request of Dr. Hal Hope.  Assessment & Plan    1.Acute on chronic diastolic CHF: -Presented witha monthhx of worsening  SOB/LE edemafound to be markedly fluid volume overloaded on presentationwith aBNP 1845andCXRconsistent with CHF -Last echocardiogram with LVEF at 60-65% with severe pulmonary mixed venous and arterial hypertension -HsT 90 -> 87, suspect demand ischemia -Creatinine up 1.5-> 1.9-> 2.1-> 2.8-> 3.08, -He is off diuretics, post resuscitation on March 27 -Lactic acid is up at 9.4-> 2.7-> 1.2, overall prognosis is poor -Currently on pressors that are managed by primary team -Chest x-ray shows significant bilateral pulmonary edema, possible aspiration pneumonia, he is on antibiotics are being controlled by primary team. -The patient develop new EKG changes, after reviewing telemetry and EKG it appears that patient is in atrial flutter with 2-1 block, his blood pressure is unchanged and he remains on support with Levophed and vasopressin -Patient's wife and daughter at bedside and explained the situation, overall prognosis very poor with acute respiratory failure heart failure worsening kidney function and advanced age.  Situation complicated by aspiration pneumonia on March 27  2. Hypoxic respiratory failure/PEA arrest secondary to aspiration yesterday: -The patient is intubated and sedated  -Management per IM   3. Paroxsymal atrial fibrillation: -Appears to be intermittently going in and out of AF with rate control -Previously underwent TEE/DCCV to NSR and has been maintained on reduced dose Eliquis given age and renal function. He is not on rate controlling medications due to hx of AF with slow ventricular response -Hb stable -CHA2DS2/VAS Stroke Risk Points= 6, currently on heparin drip  3. Mixed pulmonary venous and arterial hypertension: -Stable on last echocardiogram  -Followed by Dr. Lake Bells  4. CAD s/p CABG 1996: -No complaints of anginal symptoms -Continue current regimen with statin -No beta blocker with hx of bradycardia -No ASA in the setting of Eliquis, currently on  heparin drip  5. CKD stage III: -Creatinine above baseline on presentation at 2.18, improved with diuresis -Continue to monitor renal function closely  -Follow with daily BMET   6. HTN: -On pressors post PEA arrest yesterday  7. HLD: -Last LDL,52 on 07/21/2020 -Continue statin, Zetia  8. DM2: -HbA1c,6.4 -SSI for glucose control per IM  9. Hypothyroidism: -TSH, 12.388 this admission>>previously 3.5 on 07/21/2020 -Needs synthroid adjustment per IM  For questions or updates, please contact [place] Please consult [email or web address] for contact info under     Signed, Ena Dawley, MD  02-Apr-2021, 3:05 PM

## 2021-03-07 NOTE — Progress Notes (Addendum)
Paged by nursing regarding telemetry change and abnormal EKG reading acute MI. Reviewed at bedside with Dr. Meda Coffee - very unusual appearance of EKG felt to represent possible atrial flutter. QT appears quite prolonged. Given morphology would be concerned for some sort of metabolic derangement. There was no change in HR at the time of the EKG change. He had a follow-up tracing showing improved ST-T changes with NSR 99bpm, prolonged QTc 523ms, nonspecific STTW changes but no overt MI. Per Dr. Meda Coffee will check stat labs including blood gas. No other acute intervention felt necessary at present time, already on heparin - her formal rounding note will follow. Avoid QT prolonging agents. Haleemah Buckalew PA-C

## 2021-03-07 NOTE — Procedures (Signed)
Extubation Procedure Note  Patient Details:   Name: [name] DOB: 07-12-1932 MRN: [id number]   Airway Documentation:  Airway 7.5 mm (Active)  Secured at (cm) 25 cm 03-20-2021 1642  Measured From Lips Mar 20, 2021 1642  Secured Location Right 03/20/2021 1642  Secured By [employer] Mar 20, 2021 1642  Tube Holder Repositioned Yes 2021-03-20 1642  DO NOT USE:  Cuff Pressure (cm H2O) 28 cm H2O 03/02/21 1215  Cuff Pressure (cm H2O) Clear OR 27-39 CmH2O 03/03/21 2037  Site Condition Dry 03/20/21 1642   Vent end date: (not recorded) Vent end time: (not recorded)   Evaluation  O2 sats: stable throughout Complications: No apparent complications Patient did tolerate procedure well. Bilateral Breath Sounds: Clear,Diminished   No  Pt. Extubated per md order/CC. RT will continue to monitor.  Gifford Shave 2021/03/20, 8:25 PM

## 2021-03-07 NOTE — Significant Event (Signed)
Critical Care Attending  Worsening renal failure now with EKG changes in a patient who is 87 years old, with severe PHT and shock. Neurological status still unknown following cardiac arrest.   I have spoken with the daughter and wife and explained that we have reached the limits of what we can do. He is not responding to current interventions and his best case recovery would be a continued decline from his known comorbidities. I have explained to them that a transition to comfort care is most appropriate at this time and that we should not escalate beyond current support. DNR/DNI  Family is appropriately tearful but appreciates the care provided.  Kipp Brood, MD [facility] ICU Physician [place]  Pager: [phone] Or Epic Secure Chat After hours: [phone].  2021/03/15, 2:43 PM

## 2021-03-07 NOTE — Progress Notes (Signed)
This chaplain responded to RN-Ande's page for spiritual care. The chaplain understands the Pt. code status is DNR with a possible transition to comfort care.    The Pt. wife is at the bedside. The family is visiting in groups of three and leaning on each other.    The chaplain practiced empathetic listening as the family shares the Pt. story and love for God.  The opportunity for a clergy visit was offered to the family along with continued spiritual care.

## 2021-03-07 NOTE — Progress Notes (Signed)
Notified cardiology of new onset ST elevation.   Repeat EKG completed per cards. ST elevation appears to be resolving.

## 2021-03-07 NOTE — Progress Notes (Incomplete)
[place] for Heparin Indication: atrial fibrillation  No Known Allergies  Patient Measurements: Height: 5\' 9"  (175.3 cm) Weight: 90 kg (198 lb 6.6 oz) IBW/kg (Calculated) : 70.7 Heparin Dosing Weight: 85.9 kg  Vital Signs: Temp: 101.12 F (38.4 C) (03/29 1000) Temp Source: Bladder (03/29 0800) BP: 119/62 (03/29 1000) Pulse Rate: 100 (03/29 1000)  Labs: Recent Labs    03/02/21 1243 03/02/21 1259 03/02/21 1435 03/02/21 2218 03/03/21 0507 03/03/21 1143 03/03/21 1257 03/03/21 1418 March 06, 2021 0040 March 06, 2021 0053 Mar 06, 2021 0314  HGB  --  11.8*   < >  --  10.2*  --  11.2* 10.2*  --   --  9.5*  HCT  --  39.5   < >  --  33.3*  --  33.0* 30.0*  --   --  31.8*  PLT  --  208  --   --  191  --   --   --   --   --  178  APTT  --   --   --   --  87* 132*  --   --   --  122*  --   LABPROT  --  19.4*  --   --   --   --   --   --   --   --   --   INR  --  1.7*  --   --   --   --   --   --   --   --   --   HEPARINUNFRC  --   --   --   --  1.46*  --   --   --  1.09*  --   --   CREATININE  --  1.93*  --  2.10* 2.27*  --   --   --   --   --  2.84*  TROPONINIHS 168* 157*  --   --   --   --   --   --   --   --   --    < > = values in this interval not displayed.    Estimated Creatinine Clearance: 20.7 mL/min (A) (by C-G formula based on SCr of 2.84 mg/dL (H)).  Assessment: 87 y.o. male with h/o Afib, Eliquis on hold. Pharmacy consulted for IV heparin dosing.    Goal of Therapy:  Heparin level 0.3-0.7 units/ml Monitor platelets by anticoagulation protocol: Yes   Plan:  units/hr  Follow up with 8-hr aPTT level Monitor daily CBC, s/sx bleeding  Mercy Riding, PharmD PGY1 Acute Care Pharmacy Resident Please refer to [facility] for unit-specific pharmacist

## 2021-03-07 NOTE — Progress Notes (Signed)
Patient extubated at 20:25 TOD: 20:46 Family members all present at the bedside at [place] belongings returned to patients daughters  [place] notified of TOD

## 2021-03-07 NOTE — Death Summary Note (Signed)
DEATH SUMMARY   Patient Details  Name: [name] MRN: [id number] DOB: [birth year]  Admission/Discharge Information   Admit Date:  Mar 20, 2021  Date of Death: Date of Death: Mar 26, 2021  Time of Death: Time of Death: 03/20/2045  Length of Stay: March 05, 2023  Referring Physician: [name], MD   Reason(s) for Hospitalization  decompensated heart failure.   Diagnoses  Preliminary cause of death:  Secondary Diagnoses (including complications and co-morbidities):  Principal Problem:   Acute on chronic heart failure [facility]) Active Problems:   Essential hypertension   Renal mass s/p right partial nephrectomy 02/19/2017   Pulmonary hypertension (HCC)   Hx of CABG   COPD (chronic obstructive pulmonary disease) with emphysema (HCC)   OSA (obstructive sleep apnea)   Atrial fibrillation (HCC)   Acute respiratory failure with hypoxia (HCC)   Type 2 diabetes mellitus with vascular disease ([place])   Malnutrition of moderate degree   Prolonged QT interval   Brief Hospital Course (including significant findings, care, treatment, and services provided and events leading to death)  [name] is a 87 y.o. year old male who presented with dyspnea and was found to be in decompensated heart failure. He was initially started on BIPAP but suffered a cardiac arrest following removal of BiPAP and application of HFNC to eat.  Receive 25 min of ACLS prior to ROSC.  Remained comatose in ICU and required sedation for ventilator synchrony. Persistent pulmonary edema with worsening renal failure not responsive to diuretics.  Increasing vasopressor requirements.  Spoke to family and informed them that event likely represented the natural progression of severe PHT which is an ultimately fatal condition. Persistent shock with worsening renal failure is a poor prognostic sign especially given age and high likelihood of poor neurological recovery.   Family agreed to transition to comfort care and patient was compassionately  extubated.   Pertinent Labs and Studies  Significant Diagnostic Studies DG Chest 1 View  Result Date: 03/03/2021 CLINICAL DATA:  87 year old male with increased oxygen requirement. EXAM: CHEST  1 VIEW COMPARISON:  Earlier the same day FINDINGS: Defibrillator pad overlies left chest to mediastinum. The heart is moderately enlarged, unchanged. Postsurgical changes after coronary artery bypass graft. An endotracheal tube is in place with the distal tip terminating approximately 2 cm above the carina. Right subclavian approach central venous catheter in place with the tip in the superior vena cava. Blunting of the costophrenic angles bilaterally, similar to comparison. Diffuse patchy pulmonary opacities, basal predominant, improved from comparison. No pneumothorax. No acute osseous abnormality. IMPRESSION: Improved aeration of the lungs bilaterally with mild persistent bibasilar predominant pulmonary edema and associated trace bilateral pleural effusions. Electronically Signed   By: Ruthann Cancer MD   On: 03/03/2021 13:22   DG CHEST PORT 1 VIEW  Result Date: 03/03/2021 CLINICAL DATA:  Acute respiratory failure, hypoxia EXAM: PORTABLE CHEST 1 VIEW COMPARISON:  03/02/2021 FINDINGS: Endotracheal tube unchanged, 2.2 cm above the carina. Nasogastric tube extends into the upper abdomen. Right subclavian central venous catheter tip noted at the superior cavoatrial junction. Diffuse coarse pulmonary infiltrates are again identified and appear stable since prior examination. Pulmonary insufflation has slightly decreased in the interval. No pneumothorax. Small bilateral pleural effusions are again noted. Coronary artery bypass grafting has been performed. Cardiac size is mildly enlarged, unchanged. IMPRESSION: Stable support lines and tubes. Stable diffuse pulmonary infiltrates, possibly infectious or inflammatory. Stable small bilateral pleural effusions. Decreasing pulmonary insufflation. Electronically Signed   By:  Fidela Salisbury MD   On:  03/03/2021 07:23   DG CHEST PORT 1 VIEW  Result Date: 03/02/2021 CLINICAL DATA:  Central line placement EXAM: PORTABLE CHEST 1 VIEW COMPARISON:  03/02/2021 at 1230 hours FINDINGS: Interval placement of right subclavian approach central venous catheter with distal tip terminating at the level of the distal SVC. Endotracheal tube terminates approximately 1.9 cm above the carina. Enteric tube courses below the diaphragm with distal tip beyond the inferior margin of the film. Post CABG changes. Multiple overlying cardiac leads. Cardiomegaly. Diffuse bilateral interstitial opacities. Bilateral pleural effusions. No evidence of a pneumothorax. IMPRESSION: 1. Interval placement of right subclavian approach central venous catheter with distal tip terminating at the level of the distal SVC. No evidence of pneumothorax. 2. Persistent bilateral pleural effusions and diffuse interstitial opacities. Electronically Signed   By: Davina Poke D.O.   On: 03/02/2021 14:47   DG CHEST PORT 1 VIEW  Result Date: 03/02/2021 CLINICAL DATA:  Endotracheal tube EXAM: PORTABLE CHEST 1 VIEW COMPARISON:  03/02/2021, 6:16 a.m. FINDINGS: Interval placement of endotracheal tube, tip positioned over the mid trachea. Interval placement of esophagogastric tube, tip and side port below the diaphragm. Interval increase in diffuse bilateral interstitial pulmonary opacity, with redemonstrated layering pleural effusions. Cardiomegaly status post median sternotomy and CABG. IMPRESSION: 1. Interval placement of endotracheal tube, tip positioned over the mid trachea. 2. Interval placement of esophagogastric tube, tip and side port below the diaphragm. 3. Interval increase in diffuse bilateral interstitial pulmonary opacity consistent with worsened edema, with redemonstrated layering pleural effusions. Electronically Signed   By: Eddie Candle M.D.   On: 03/02/2021 14:27   DG CHEST PORT 1 VIEW  Result Date:  03/02/2021 CLINICAL DATA:  Shortness of breath EXAM: PORTABLE CHEST 1 VIEW COMPARISON:  02/28/2021 and prior studies FINDINGS: Cardiomegaly, CABG changes, bilateral interstitial/airspace opacities and bilateral pleural effusions are again identified and unchanged. There is no evidence of pneumothorax. No acute bony abnormalities are identified. IMPRESSION: Unchanged appearance of the chest with bilateral interstitial/airspace opacities and bilateral pleural effusions. Electronically Signed   By: Margarette Canada M.D.   On: 03/02/2021 08:39   DG CHEST PORT 1 VIEW  Result Date: 02/28/2021 CLINICAL DATA:  Shortness of breath EXAM: PORTABLE CHEST 1 VIEW COMPARISON:  February 26, 2021; August 23, 2020 FINDINGS: There are partially loculated pleural effusions bilaterally. Fluid tracks along the right minor fissure. There is diffuse interstitial prominence which is consistent with interstitial pulmonary edema, potentially with underlying fibrosis. There is airspace consolidation in the left lower lobe and medial right base. There is cardiomegaly with pulmonary venous hypertension. Patient is status post coronary artery bypass grafting. No adenopathy. No bone lesions. IMPRESSION: Cardiomegaly with pulmonary vascular congestion. Interstitial edema with pleural effusions bilaterally, partially loculated. Overall appearance is felt to be indicative of a degree of congestive heart failure. Patient is status post coronary artery bypass grafting. Airspace opacity in the left lower lobe and medial right base likely represents superimposed atelectasis and pneumonia, although alveolar edema in these areas is possible. Alveolar edema and pneumonia may present concurrently. Electronically Signed   By: Lowella Grip III M.D.   On: 02/28/2021 08:04   DG Chest Portable 1 View  Result Date: 02/08/2021 CLINICAL DATA:  Shortness of breath EXAM: PORTABLE CHEST 1 VIEW COMPARISON:  12/26/2020 FINDINGS: Prior CABG. Moderate to large left  effusion. Small right pleural effusion. Diffuse airspace disease throughout the lungs. Mild cardiomegaly. Underlying COPD. No acute bony abnormality. IMPRESSION: Borderline heart size. Bilateral pleural effusions, left greater than right. Bilateral airspace  disease, likely edema. Underlying COPD. Electronically Signed   By: Rolm Baptise M.D.   On: 02/22/2021 18:34   DG Abd Portable 1V  Result Date: 03/02/2021 CLINICAL DATA:  OG tube placement. EXAM: PORTABLE ABDOMEN - 1 VIEW COMPARISON:  Prior radiographs FINDINGS: An OG tube is noted with tip overlying the mid stomach. No dilated bowel loops are identified.  No other changes noted. IMPRESSION: OG tube with tip overlying the mid stomach. Electronically Signed   By: Margarette Canada M.D.   On: 03/02/2021 14:24    Microbiology Recent Results (from the past 240 hour(s))  SARS CORONAVIRUS 2 (TAT 6-24 HRS) Nasopharyngeal Nasopharyngeal Swab     Status: None   Collection Time: 02/25/2021  9:38 PM   Specimen: Nasopharyngeal Swab  Result Value Ref Range Status   SARS Coronavirus 2 NEGATIVE NEGATIVE Final    Comment: (NOTE) SARS-CoV-2 target nucleic acids are NOT DETECTED.  The SARS-CoV-2 RNA is generally detectable in upper and lower respiratory specimens during the acute phase of infection. Negative results do not preclude SARS-CoV-2 infection, do not rule out co-infections with other pathogens, and should not be used as the sole basis for treatment or other patient management decisions. Negative results must be combined with clinical observations, patient history, and epidemiological information. The expected result is Negative.  Fact Sheet for Patients: SugarRoll.be  Fact Sheet for Healthcare Providers: https://www.woods-mathews.com/  This test is not yet approved or cleared by the [place] FDA and  has been authorized for detection and/or diagnosis of SARS-CoV-2 by FDA under an Emergency Use  Authorization (EUA). This EUA will remain  in effect (meaning this test can be used) for the duration of the COVID-19 declaration under Se ction 564(b)(1) of the Act, 21 U.S.C. section 360bbb-3(b)(1), unless the authorization is terminated or revoked sooner.  Performed at [facility], [place] [address], [place], [place] [address]   MRSA PCR Screening     Status: None   Collection Time: 03/02/21  5:12 PM   Specimen: Nasal Mucosa; Nasopharyngeal  Result Value Ref Range Status   MRSA by PCR NEGATIVE NEGATIVE Final    Comment:        The GeneXpert MRSA Assay (FDA approved for NASAL specimens only), is one component of a comprehensive MRSA colonization surveillance program. It is not intended to diagnose MRSA infection nor to guide or monitor treatment for MRSA infections. Performed at [facility], [place] [address] [place], [place] [address]   Culture, Respiratory w Gram Stain     Status: None   Collection Time: 03/02/21  8:16 PM   Specimen: Tracheal Aspirate; Respiratory  Result Value Ref Range Status   Specimen Description TRACHEAL ASPIRATE  Final   Special Requests NONE  Final   Gram Stain   Final    MODERATE WBC PRESENT, PREDOMINANTLY PMN ABUNDANT GRAM POSITIVE COCCI IN PAIRS IN CLUSTERS FEW GRAM NEGATIVE RODS    Culture   Final    MODERATE Normal respiratory flora-no Staph aureus or Pseudomonas seen Performed at [facility], [address], [place], [place] [address]    Report Status Mar 08, 2021 FINAL  Final    Lab Basic Metabolic Panel: Recent Labs  Lab 03/02/21 1259 03/02/21 1435 03/02/21 1522 03/02/21 2218 03/03/21 0507 03/03/21 1257 03/03/21 1418 03/03/21 1710 March 08, 2021 0314 March 08, 2021 1300 03-08-2021 1306  NA 146*   < >  --  141 141 142 143  --  141 140 140  K 3.3*   < >  --  4.4 4.6 4.7 4.5  --  4.7 5.3* 5.3*  CL 108  --   --  107 107  --   --   --  106 104  --   CO2 26  --   --  28 29  --   --   --  29 28  --   GLUCOSE 243*  --   --   197* 226*  --   --   --  168* 210*  --   BUN 24*  --   --  31* 35*  --   --   --  48* 55*  --   CREATININE 1.93*  --   --  2.10* 2.27*  --   --   --  2.84* 3.08*  --   CALCIUM 8.0*  --   --  8.1* 8.2*  --   --   --  8.0* 7.8*  --   MG 2.6*  --  2.1  --  2.1  --   --  2.2 2.2 2.2  --   PHOS  --   --  4.4  --  4.4  --   --  4.7*  --   --   --    < > = values in this interval not displayed.   Liver Function Tests: Recent Labs  Lab 02/27/21 0213 03/02/21 1259 03/02/21 2218  AST 40 127* 120*  ALT 26 62* 58*  ALKPHOS 83 92 98  BILITOT 1.4* 0.9 0.9  PROT 5.9* 5.3* 5.4*  ALBUMIN 2.6* 2.2* 2.4*   No results for input(s): LIPASE, AMYLASE in the last 168 hours. No results for input(s): AMMONIA in the last 168 hours. CBC: Recent Labs  Lab 02/21/2021 1812 02/24/2021 2110 03/02/21 0410 03/02/21 1259 03/02/21 1435 03/03/21 0507 03/03/21 1257 03/03/21 1418 March 29, 2021 0314 03/29/21 1300 03-29-2021 1306  WBC 8.6   < > 9.3 11.5*  --  12.6*  --   --  13.3* 13.9*  --   NEUTROABS 6.0  --   --  7.0  --   --   --   --   --   --   --   HGB 13.3   < > 11.6* 11.8*   < > 10.2* 11.2* 10.2* 9.5* 9.0* 9.2*  HCT 43.2   < > 36.3* 39.5   < > 33.3* 33.0* 30.0* 31.8* 29.6* 27.0*  MCV 90.2   < > 88.1 94.3  --  89.5  --   --  91.4 93.1  --   PLT 231   < > 187 208  --  191  --   --  178 182  --    < > = values in this interval not displayed.   Cardiac Enzymes: No results for input(s): CKTOTAL, CKMB, CKMBINDEX, TROPONINI in the last 168 hours. Sepsis Labs: Recent Labs  Lab 03/02/21 1235 03/02/21 1259 03/02/21 1522 03/03/21 0507 03/03/21 1343 03-29-2021 0314 03/29/2021 1300  WBC  --  11.5*  --  12.6*  --  13.3* 13.9*  LATICACIDVEN 9.4*  --  2.7*  --  1.2  --   --     Procedures/Operations  Mechanical ventilation, CPR.   Jaidynn Balster 02/13/2021, 3:03 PM

## 2021-03-07 NOTE — Progress Notes (Signed)
[place] for Heparin Indication: atrial fibrillation  No Known Allergies  Patient Measurements: Height: 5\' 9"  (175.3 cm) Weight: 86.7 kg (191 lb 2.2 oz) IBW/kg (Calculated) : 70.7 Heparin Dosing Weight: 85.9 kg  Vital Signs: Temp: 99.9 F (37.7 C) (03/29 0100) Temp Source: Bladder (03/29 0000) BP: 129/69 (03/29 0100) Pulse Rate: 81 (03/29 0100)  Labs: Recent Labs    03/02/21 0410 03/02/21 1243 03/02/21 1259 03/02/21 1435 03/02/21 2218 03/03/21 0507 03/03/21 1143 03/03/21 1257 03/03/21 1418 Mar 25, 2021 0040 03/25/21 0053  HGB 11.6*  --  11.8*   < >  --  10.2*  --  11.2* 10.2*  --   --   HCT 36.3*  --  39.5   < >  --  33.3*  --  33.0* 30.0*  --   --   PLT 187  --  208  --   --  191  --   --   --   --   --   APTT  --   --   --   --   --  87* 132*  --   --   --  122*  LABPROT  --   --  19.4*  --   --   --   --   --   --   --   --   INR  --   --  1.7*  --   --   --   --   --   --   --   --   HEPARINUNFRC  --   --   --   --   --  1.46*  --   --   --  1.09*  --   CREATININE 1.50*  --  1.93*  --  2.10* 2.27*  --   --   --   --   --   TROPONINIHS  --  168* 157*  --   --   --   --   --   --   --   --    < > = values in this interval not displayed.    Estimated Creatinine Clearance: 25.5 mL/min (A) (by C-G formula based on SCr of 2.27 mg/dL (H)).  Assessment: 87 y.o. male with h/o Afib, Eliquis on hold. Pharmacy consulted for IV heparin dosing.  8-hr f/u aPTT level supratherapeutic at 132. CBC stable, slight oozing with dressing changes noted per RN.   3/29 AM update: APTT remains elevated Did have some bleeding from an IV site that has resolved  Goal of Therapy:  APTT 66-102 secs Heparin level 0.3-0.7 units/ml Monitor platelets by anticoagulation protocol: Yes   Plan:  Decrease IV heparin to 850 units/hr Follow up with 8-hr aPTT/heparin level Monitor for any further bleeding Daily CBC  Narda Bonds, PharmD, BCPS Clinical  Pharmacist Phone: [phone]

## 2021-03-07 NOTE — Progress Notes (Signed)
NAME:  [name], MRN:  [id number], DOB:  [birth year], LOS: 6 ADMISSION DATE:  03/01/2021, CONSULTATION DATE: 3/27 REFERRING MD: Dr. Posey Pronto, CHIEF COMPLAINT:  SOB  History of Present Illness:  87 year old male who presented to [facility] ER on 3/23 with reports of shortness of breath.    The patient was admitted per [facility] with concerns for acute on chronic diastolic CHF and acute hypoxemic respiratory failure.  He was noted to be significantly volume overloaded on presentation with a BNP of 1845 and chest x-ray consistent with CHF. At baseline he wears 3 L O2 but was noted to require 15 L on admission.  He was placed on BiPAP for respiratory distress.  Per documentation there were episodes of desaturation into the 80s on 3/26 when removed from BiPAP. He was treated with IV diuresis, albumin and BiPAP support.  Work-up notable for mildly elevated troponin thought related to demand ischemia.  Cardiology was consulted.  He also had significantly elevated TSH at 12.388.  Repeat free T4 pending.   On 3/27 the BiPAP was removed so he could eat lunch.  He was placed on 15L salter. The patient reportedly had an aspiration event and then suffered a cardiopulmonary arrest.  He underwent 25 minutes of ACLS before ROSC.  The patient was intubated by anesthesia and transferred to ICU for further evaluation.   PCCM consulted for evaluation.  Pertinent  Medical History  AF -on Eliquis CAD s/p CABG HTN HLD COVID PNA - January 2022  Mixed pulmonary and venous arterial hypertension Chronic Hypoxic Respiratory Failure  COPD OSA DM2 Hypothyroidism CKD 3B - baseline sr cr ~ 1.5 History of renal mass, nephrectomy Anemia of chronic kidney disease  Significant Hospital Events: Including procedures, antibiotic start and stop dates in addition to other pertinent events   . 3/23 admit per [facility] with SOB thought related to decompensated CHF, hypoxic resp failure . 3/27 cardiac arrest, transferred to ICU, intubated. PCCM    . 3/27 EEG shows predominant medication effect.  . 3/27 central line placement Chand . 3/28 fluctuating BP on/off pressors. Still volume overloaded   Interim History / Subjective:  Fluctuating BP. Not diuresing despite furosemide infusion.   Objective   Blood pressure (!) 135/52, pulse (!) 102, temperature (!) 100.76 F (38.2 C), temperature source Bladder, resp. rate (!) 22, height 5\' 9"  (1.753 m), weight 90 kg, SpO2 100 %. CVP:  [7 mmHg-30 mmHg] 13 mmHg  Vent Mode: PRVC FiO2 (%):  [50 %-100 %] 50 % Set Rate:  [22 bmp] 22 bmp Vt Set:  [570 mL] 570 mL PEEP:  [10 cmH20] 10 cmH20 Pressure Support:  [10 cmH20] 10 cmH20 Plateau Pressure:  [22 cmH20-30 cmH20] 29 cmH20   Intake/Output Summary (Last 24 hours) at March 09, 2021 0902 Last data filed at March 09, 2021 5465 Gross per 24 hour  Intake 2336.46 ml  Output 420 ml  Net 1916.46 ml   Filed Weights   03/02/21 1300 03/03/21 0226 Mar 09, 2021 0248  Weight: 85.9 kg 86.7 kg 90 kg    Examination: General: Chronically ill-appearing, elderly male lying in bed on vent in no acute distress HEENT: MM pink/moist ETT, anicteric Neuro: sedated. No spontaneous movement, minimal extension to pain.  CV: JVP to the angle of the jaw. HS normal. Hypertensive and JVP elevated.  PULM: prolonged expiratory phase, lungs bilaterally with distant wheeze, tolerating PSV GI: soft, bsx4 active  Extremities: warm/dry, anasarca with severe stasis dermatitis.   Reviewed ancillary tests:   Increasing creatinine  Resolved Hospital Problem  list     Assessment & Plan:   Critically ill due to Cardiac Arrest with Shock requiring titration of vasopressors.  - Suspect decompensated HF, aspiration also a consideration, but elevated filling pressures and progressive infiltrates on CXR Critically ill duet Acute on Chronic Hypoxic and hypercarbic Respiratory Failure requiring mechanical ventilation COPD OSA Possible Aspiration PNA  PAF  HTN HLD CAD s/p CABG  (1986) CKD IIIb Lactic Acidosis in setting of Cardiac Arrest  DM II  Hypothyroidism  At Risk Malnutrition   Plan:  - wean vasopressors off as tolerated.  - diuresis again today with furosemide infusion and metolazone.  - continue current sedation strategy to encourage ventilator synchrony - continue antibiotics for 7 days. - tolerating PSV but will not tolerate extubation until pulmonary edema improves and mentation clears. May not be able to tolerate being off sedation until pulmonary edema improves.  - hold on starting nitrates or other HF therapies until off vasopressors entirely   - Prognosis is not good given age, severe pulmonary hypertension and recent cardiac arrest. Would not be an acceptable hemodialysis candidate as he would not be able to tolerate fluid removal given PHT. Will consult palliative care as we are approaching the limit of what can be usefully done for him.    Best practice (right click and "Reselect all SmartList Selections" daily)  Diet:  Tube Feed  Pain/Anxiety/Delirium protocol (if indicated): Yes (RASS goal -1) VAP protocol (if indicated): Yes DVT prophylaxis: Systemic AC GI prophylaxis: H2B Glucose control:  SSI No and Basal insulin Yes Central venous access:  Yes, and it is still needed Arterial line:  Yes, and it is still needed Foley:  N/A Mobility:  bed rest  PT consulted: N/A Last date of multidisciplinary goals of care discussion: Per Dr. Posey Pronto / Dr. Harrell Gave prior to transfer to ICU.  Code Status:  full code Disposition: ICU   Labs   CBC: Recent Labs  Lab 02/05/2021 1812 02/25/2021 2110 03/01/21 0208 03/02/21 0410 03/02/21 1259 03/02/21 1435 03/03/21 0507 03/03/21 1257 03/03/21 1418 March 31, 2021 0314  WBC 8.6   < > 8.2 9.3 11.5*  --  12.6*  --   --  13.3*  NEUTROABS 6.0  --   --   --  7.0  --   --   --   --   --   HGB 13.3   < > 12.3* 11.6* 11.8* 11.2* 10.2* 11.2* 10.2* 9.5*  HCT 43.2   < > 39.3 36.3* 39.5 33.0* 33.3* 33.0* 30.0*  31.8*  MCV 90.2   < > 87.7 88.1 94.3  --  89.5  --   --  91.4  PLT 231   < > 194 187 208  --  191  --   --  178   < > = values in this interval not displayed.    Basic Metabolic Panel: Recent Labs  Lab 03/02/21 0410 03/02/21 1259 03/02/21 1435 03/02/21 1522 03/02/21 2218 03/03/21 0507 03/03/21 1257 03/03/21 1418 03/03/21 1710 03/31/2021 0314  NA 144 146*   < >  --  141 141 142 143  --  141  K 3.9 3.3*   < >  --  4.4 4.6 4.7 4.5  --  4.7  CL 107 108  --   --  107 107  --   --   --  106  CO2 31 26  --   --  28 29  --   --   --  29  GLUCOSE  118* 243*  --   --  197* 226*  --   --   --  168*  BUN 23 24*  --   --  31* 35*  --   --   --  48*  CREATININE 1.50* 1.93*  --   --  2.10* 2.27*  --   --   --  2.84*  CALCIUM 8.5* 8.0*  --   --  8.1* 8.2*  --   --   --  8.0*  MG 2.1 2.6*  --  2.1  --  2.1  --   --  2.2 2.2  PHOS  --   --   --  4.4  --  4.4  --   --  4.7*  --    < > = values in this interval not displayed.   GFR: Estimated Creatinine Clearance: 20.7 mL/min (A) (by C-G formula based on SCr of 2.84 mg/dL (H)). Recent Labs  Lab 03/02/21 0410 03/02/21 1235 03/02/21 1259 03/02/21 1522 03/03/21 0507 03/03/21 1343 April 01, 2021 0314  WBC 9.3  --  11.5*  --  12.6*  --  13.3*  LATICACIDVEN  --  9.4*  --  2.7*  --  1.2  --     Liver Function Tests: Recent Labs  Lab 02/27/21 0213 03/02/21 1259 03/02/21 2218  AST 40 127* 120*  ALT 26 62* 58*  ALKPHOS 83 92 98  BILITOT 1.4* 0.9 0.9  PROT 5.9* 5.3* 5.4*  ALBUMIN 2.6* 2.2* 2.4*   No results for input(s): LIPASE, AMYLASE in the last 168 hours. No results for input(s): AMMONIA in the last 168 hours.  ABG    Component Value Date/Time   PHART 7.409 03/03/2021 1418   PCO2ART 51.8 (H) 03/03/2021 1418   PO2ART 477 (H) 03/03/2021 1418   HCO3 32.6 (H) 03/03/2021 1418   TCO2 34 (H) 03/03/2021 1418   ACIDBASEDEF 4.5 (H) 02/19/2017 2054   O2SAT 100.0 03/03/2021 1418     Coagulation Profile: Recent Labs  Lab 03/02/21 1259   INR 1.7*    Cardiac Enzymes: No results for input(s): CKTOTAL, CKMB, CKMBINDEX, TROPONINI in the last 168 hours.  HbA1C: Hgb A1c MFr Bld  Date/Time Value Ref Range Status  03/02/2021 03:22 PM 5.5 4.8 - 5.6 % Final    Comment:    (NOTE) Pre diabetes:          5.7%-6.4%  Diabetes:              >6.4%  Glycemic control for   <7.0% adults with diabetes   07/20/2020 04:24 PM 6.4 (H) 4.8 - 5.6 % Final    Comment:    (NOTE) Pre diabetes:          5.7%-6.4%  Diabetes:              >6.4%  Glycemic control for   <7.0% adults with diabetes     CBG: Recent Labs  Lab 03/03/21 1157 03/03/21 1625 03/03/21 2032 04/01/2021 0044 01-Apr-2021 0321  GLUCAP 149* 139* 128* 159* 147*   CRITICAL CARE Performed by: Kipp Brood   Total critical care time: 40 minutes  Critical care time was exclusive of separately billable procedures and treating other patients.  Critical care was necessary to treat or prevent imminent or life-threatening deterioration.  Critical care was time spent personally by me on the following activities: development of treatment plan with patient and/or surrogate as well as nursing, discussions with consultants, evaluation of patient's response to treatment, examination  of patient, obtaining history from patient or surrogate, ordering and performing treatments and interventions, ordering and review of laboratory studies, ordering and review of radiographic studies, pulse oximetry, re-evaluation of patient's condition and participation in multidisciplinary rounds.  Kipp Brood, MD [facility] ICU Physician [place]  Pager: [phone] Mobile: [phone] After hours: [phone].

## 2021-03-07 NOTE — Progress Notes (Signed)
Change in EKG morphology noted around 1000. Rhythm remains ST.  Elink notified this RN to confirm rhythm change at 1042.  ST elevation appeared to be more increased and STAT EKG obtained at 1113 showing long QTc, STEMI.  Dr. Lynetta Mare notified and en route at 1118.

## 2021-03-07 DEATH — deceased

## 2021-03-08 MED FILL — Medication: Qty: 1 | Status: AC

## 2021-04-23 ENCOUNTER — Ambulatory Visit: Payer: Medicare Other | Admitting: Internal Medicine
